# Patient Record
Sex: Male | Born: 1975 | Race: White | Hispanic: No | Marital: Single | Smoking: Never smoker
Health system: Southern US, Community
[De-identification: ages and names within clinical notes are randomized; demographics above are authoritative.]

## PROBLEM LIST (undated history)

## (undated) ENCOUNTER — Encounter

## (undated) ENCOUNTER — Encounter: Attending: Hematology & Oncology | Primary: Hematology & Oncology

## (undated) ENCOUNTER — Ambulatory Visit

## (undated) ENCOUNTER — Ambulatory Visit
Payer: BLUE CROSS/BLUE SHIELD | Attending: Student in an Organized Health Care Education/Training Program | Primary: Student in an Organized Health Care Education/Training Program

## (undated) ENCOUNTER — Telehealth

## (undated) ENCOUNTER — Telehealth: Attending: Gastroenterology | Primary: Gastroenterology

## (undated) ENCOUNTER — Ambulatory Visit: Payer: BLUE CROSS/BLUE SHIELD

## (undated) ENCOUNTER — Encounter: Attending: Gastroenterology | Primary: Gastroenterology

## (undated) ENCOUNTER — Telehealth: Attending: Hematology & Oncology | Primary: Hematology & Oncology

## (undated) ENCOUNTER — Encounter: Attending: Clinical | Primary: Clinical

## (undated) ENCOUNTER — Encounter
Attending: Student in an Organized Health Care Education/Training Program | Primary: Student in an Organized Health Care Education/Training Program

## (undated) ENCOUNTER — Other Ambulatory Visit

## (undated) ENCOUNTER — Telehealth: Attending: Radiation Oncology | Primary: Radiation Oncology

## (undated) ENCOUNTER — Encounter: Attending: Radiation Oncology | Primary: Radiation Oncology

## (undated) ENCOUNTER — Ambulatory Visit: Attending: Clinical | Primary: Clinical

## (undated) ENCOUNTER — Encounter: Attending: Oncology | Primary: Oncology

## (undated) ENCOUNTER — Encounter: Attending: Psychiatry | Primary: Psychiatry

## (undated) ENCOUNTER — Ambulatory Visit: Payer: PRIVATE HEALTH INSURANCE

## (undated) ENCOUNTER — Encounter: Attending: "Endocrinology | Primary: "Endocrinology

## (undated) ENCOUNTER — Ambulatory Visit: Attending: Hematology & Oncology | Primary: Hematology & Oncology

## (undated) ENCOUNTER — Inpatient Hospital Stay: Payer: BLUE CROSS/BLUE SHIELD

## (undated) ENCOUNTER — Ambulatory Visit: Payer: PRIVATE HEALTH INSURANCE | Attending: Clinical | Primary: Clinical

## (undated) ENCOUNTER — Telehealth: Attending: Oncology | Primary: Oncology

## (undated) ENCOUNTER — Telehealth
Attending: Student in an Organized Health Care Education/Training Program | Primary: Student in an Organized Health Care Education/Training Program

## (undated) ENCOUNTER — Ambulatory Visit: Attending: Registered" | Primary: Registered"

## (undated) ENCOUNTER — Ambulatory Visit: Payer: PRIVATE HEALTH INSURANCE | Attending: Radiation Oncology | Primary: Radiation Oncology

## (undated) ENCOUNTER — Encounter: Payer: PRIVATE HEALTH INSURANCE | Attending: Clinical | Primary: Clinical

## (undated) ENCOUNTER — Encounter: Payer: PRIVATE HEALTH INSURANCE | Attending: Gastroenterology | Primary: Gastroenterology

## (undated) ENCOUNTER — Ambulatory Visit
Payer: PRIVATE HEALTH INSURANCE | Attending: Student in an Organized Health Care Education/Training Program | Primary: Student in an Organized Health Care Education/Training Program

## (undated) ENCOUNTER — Ambulatory Visit: Attending: Psychiatry | Primary: Psychiatry

## (undated) ENCOUNTER — Encounter
Payer: PRIVATE HEALTH INSURANCE | Attending: Student in an Organized Health Care Education/Training Program | Primary: Student in an Organized Health Care Education/Training Program

## (undated) ENCOUNTER — Encounter: Payer: PRIVATE HEALTH INSURANCE | Attending: Otolaryngology | Primary: Otolaryngology

## (undated) ENCOUNTER — Encounter: Payer: PRIVATE HEALTH INSURANCE | Attending: Psychiatry | Primary: Psychiatry

## (undated) ENCOUNTER — Ambulatory Visit
Payer: PRIVATE HEALTH INSURANCE | Attending: Rehabilitative and Restorative Service Providers" | Primary: Rehabilitative and Restorative Service Providers"

## (undated) ENCOUNTER — Encounter: Attending: Otolaryngology | Primary: Otolaryngology

## (undated) ENCOUNTER — Ambulatory Visit: Payer: BLUE CROSS/BLUE SHIELD | Attending: Clinical | Primary: Clinical

## (undated) ENCOUNTER — Non-Acute Institutional Stay: Payer: PRIVATE HEALTH INSURANCE | Attending: Psychiatry | Primary: Psychiatry

## (undated) ENCOUNTER — Encounter: Attending: Psychiatric/Mental Health | Primary: Psychiatric/Mental Health

## (undated) ENCOUNTER — Ambulatory Visit: Payer: PRIVATE HEALTH INSURANCE | Attending: Registered" | Primary: Registered"

## (undated) ENCOUNTER — Encounter: Payer: PRIVATE HEALTH INSURANCE | Attending: Geriatric Medicine | Primary: Geriatric Medicine

## (undated) ENCOUNTER — Ambulatory Visit: Payer: PRIVATE HEALTH INSURANCE | Attending: Hematology & Oncology | Primary: Hematology & Oncology

## (undated) ENCOUNTER — Other Ambulatory Visit: Payer: BLUE CROSS/BLUE SHIELD

## (undated) ENCOUNTER — Ambulatory Visit: Payer: BLUE CROSS/BLUE SHIELD | Attending: Hematology & Oncology | Primary: Hematology & Oncology

## (undated) ENCOUNTER — Encounter: Attending: Geriatric Medicine | Primary: Geriatric Medicine

## (undated) ENCOUNTER — Institutional Professional Consult (permissible substitution): Attending: Oncology | Primary: Oncology

## (undated) ENCOUNTER — Telehealth: Payer: PRIVATE HEALTH INSURANCE

## (undated) ENCOUNTER — Telehealth: Attending: Geriatric Medicine | Primary: Geriatric Medicine

## (undated) DIAGNOSIS — K5792 Diverticulitis of intestine, part unspecified, without perforation or abscess without bleeding: Secondary | ICD-10-CM

## (undated) DIAGNOSIS — M459 Ankylosing spondylitis of unspecified sites in spine: Secondary | ICD-10-CM

## (undated) DIAGNOSIS — G629 Polyneuropathy, unspecified: Secondary | ICD-10-CM

## (undated) DIAGNOSIS — F32A Depression, unspecified: Secondary | ICD-10-CM

## (undated) DIAGNOSIS — K429 Umbilical hernia without obstruction or gangrene: Secondary | ICD-10-CM

## (undated) DIAGNOSIS — C7931 Secondary malignant neoplasm of brain: Secondary | ICD-10-CM

## (undated) DIAGNOSIS — C73 Malignant neoplasm of thyroid gland: Secondary | ICD-10-CM

## (undated) DIAGNOSIS — C439 Malignant melanoma of skin, unspecified: Secondary | ICD-10-CM

## (undated) HISTORY — PX: OTHER SURGICAL HISTORY: SHX169

## (undated) HISTORY — PX: COLON SURGERY: SHX602

## (undated) HISTORY — PX: BRAIN SURGERY: SHX531

## (undated) HISTORY — PX: LYMPH NODE DISSECTION: SHX5087

## (undated) HISTORY — PX: HERNIA REPAIR: SHX51

## (undated) HISTORY — PX: CRANIOTOMY FOR TUMOR: SUR345

## (undated) HISTORY — PX: THYROID SURGERY: SHX805

## (undated) MED ORDER — IBUPROFEN 200 MG TABLET: Freq: Every day | ORAL | 0.00000 days | PRN

## (undated) MED ORDER — DEXAMETHASONE 2 MG TABLET
Freq: Two times a day (BID) | ORAL | 0.00000 days | Status: SS
Start: ? — End: 2020-02-02

## (undated) MED ORDER — MEMANTINE 10 MG TABLET: Freq: Every day | ORAL | 0.00000 days | Status: SS

## (undated) MED ORDER — LEVOTHYROXINE 125 MCG TABLET: Freq: Every day | ORAL | 0.00000 days | Status: SS

---
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
## 2007-03-05 ENCOUNTER — Encounter (INDEPENDENT_AMBULATORY_CARE_PROVIDER_SITE_OTHER): Payer: Self-pay | Admitting: *Deleted

## 2007-03-05 ENCOUNTER — Inpatient Hospital Stay (HOSPITAL_COMMUNITY): Admission: EM | Admit: 2007-03-05 | Discharge: 2007-03-09 | Payer: Self-pay | Admitting: Family Medicine

## 2007-03-05 ENCOUNTER — Ambulatory Visit: Payer: Self-pay | Admitting: Internal Medicine

## 2007-03-05 ENCOUNTER — Encounter: Admission: RE | Admit: 2007-03-05 | Discharge: 2007-03-05 | Payer: Self-pay | Admitting: Orthopedic Surgery

## 2007-03-17 ENCOUNTER — Encounter (INDEPENDENT_AMBULATORY_CARE_PROVIDER_SITE_OTHER): Payer: Self-pay | Admitting: *Deleted

## 2007-04-01 ENCOUNTER — Encounter (INDEPENDENT_AMBULATORY_CARE_PROVIDER_SITE_OTHER): Payer: Self-pay | Admitting: *Deleted

## 2007-04-01 ENCOUNTER — Encounter: Admission: RE | Admit: 2007-04-01 | Discharge: 2007-04-01 | Payer: Self-pay | Admitting: General Surgery

## 2007-04-03 ENCOUNTER — Emergency Department (HOSPITAL_COMMUNITY): Admission: EM | Admit: 2007-04-03 | Discharge: 2007-04-04 | Payer: Self-pay | Admitting: Emergency Medicine

## 2008-11-09 ENCOUNTER — Encounter: Payer: Self-pay | Admitting: Gastroenterology

## 2009-08-14 ENCOUNTER — Encounter: Payer: Self-pay | Admitting: Gastroenterology

## 2009-09-13 ENCOUNTER — Encounter (INDEPENDENT_AMBULATORY_CARE_PROVIDER_SITE_OTHER): Payer: Self-pay | Admitting: *Deleted

## 2009-09-13 ENCOUNTER — Inpatient Hospital Stay (HOSPITAL_COMMUNITY): Admission: EM | Admit: 2009-09-13 | Discharge: 2009-09-16 | Payer: Self-pay | Admitting: Family Medicine

## 2009-09-14 ENCOUNTER — Encounter (INDEPENDENT_AMBULATORY_CARE_PROVIDER_SITE_OTHER): Payer: Self-pay | Admitting: *Deleted

## 2009-10-13 ENCOUNTER — Encounter (INDEPENDENT_AMBULATORY_CARE_PROVIDER_SITE_OTHER): Payer: Self-pay | Admitting: *Deleted

## 2009-12-11 ENCOUNTER — Encounter: Payer: Self-pay | Admitting: Gastroenterology

## 2009-12-12 ENCOUNTER — Encounter (INDEPENDENT_AMBULATORY_CARE_PROVIDER_SITE_OTHER): Payer: Self-pay | Admitting: *Deleted

## 2010-01-11 ENCOUNTER — Encounter: Payer: Self-pay | Admitting: Gastroenterology

## 2010-01-18 DIAGNOSIS — M109 Gout, unspecified: Secondary | ICD-10-CM | POA: Insufficient documentation

## 2010-01-23 ENCOUNTER — Other Ambulatory Visit: Payer: Self-pay | Admitting: Gastroenterology

## 2010-01-23 ENCOUNTER — Ambulatory Visit
Admission: RE | Admit: 2010-01-23 | Discharge: 2010-01-23 | Payer: Self-pay | Source: Home / Self Care | Attending: Gastroenterology | Admitting: Gastroenterology

## 2010-01-23 ENCOUNTER — Encounter (INDEPENDENT_AMBULATORY_CARE_PROVIDER_SITE_OTHER): Payer: Self-pay | Admitting: *Deleted

## 2010-01-23 ENCOUNTER — Encounter: Payer: Self-pay | Admitting: Gastroenterology

## 2010-01-23 DIAGNOSIS — M47817 Spondylosis without myelopathy or radiculopathy, lumbosacral region: Secondary | ICD-10-CM | POA: Insufficient documentation

## 2010-01-23 DIAGNOSIS — R1032 Left lower quadrant pain: Secondary | ICD-10-CM | POA: Insufficient documentation

## 2010-01-23 LAB — IBC PANEL
Iron: 94 ug/dL (ref 42–165)
Saturation Ratios: 27.5 % (ref 20.0–50.0)
Transferrin: 244.2 mg/dL (ref 212.0–360.0)

## 2010-01-23 LAB — CBC WITH DIFFERENTIAL/PLATELET
Basophils Absolute: 0.4 10*3/uL — ABNORMAL HIGH (ref 0.0–0.1)
Basophils Relative: 4.1 % — ABNORMAL HIGH (ref 0.0–3.0)
Eosinophils Absolute: 0.1 10*3/uL (ref 0.0–0.7)
Eosinophils Relative: 1.3 % (ref 0.0–5.0)
HCT: 45.7 % (ref 39.0–52.0)
Hemoglobin: 15.6 g/dL (ref 13.0–17.0)
Lymphocytes Relative: 23.1 % (ref 12.0–46.0)
Lymphs Abs: 2.1 10*3/uL (ref 0.7–4.0)
MCHC: 34.2 g/dL (ref 30.0–36.0)
MCV: 88.2 fl (ref 78.0–100.0)
Monocytes Absolute: 0.6 10*3/uL (ref 0.1–1.0)
Monocytes Relative: 6.3 % (ref 3.0–12.0)
Neutro Abs: 5.9 10*3/uL (ref 1.4–7.7)
Neutrophils Relative %: 65.2 % (ref 43.0–77.0)
Platelets: 257 10*3/uL (ref 150.0–400.0)
RBC: 5.17 Mil/uL (ref 4.22–5.81)
RDW: 14.5 % (ref 11.5–14.6)
WBC: 9 10*3/uL (ref 4.5–10.5)

## 2010-01-23 LAB — HEPATIC FUNCTION PANEL
ALT: 34 U/L (ref 0–53)
AST: 32 U/L (ref 0–37)
Albumin: 4.3 g/dL (ref 3.5–5.2)
Alkaline Phosphatase: 49 U/L (ref 39–117)
Bilirubin, Direct: 0.1 mg/dL (ref 0.0–0.3)
Total Bilirubin: 0.8 mg/dL (ref 0.3–1.2)
Total Protein: 7.2 g/dL (ref 6.0–8.3)

## 2010-01-23 LAB — BASIC METABOLIC PANEL
BUN: 10 mg/dL (ref 6–23)
CO2: 27 mEq/L (ref 19–32)
Calcium: 9.5 mg/dL (ref 8.4–10.5)
Chloride: 104 mEq/L (ref 96–112)
Creatinine, Ser: 0.9 mg/dL (ref 0.4–1.5)
GFR: 107.69 mL/min (ref >60.00)
Glucose, Bld: 79 mg/dL (ref 70–99)
Potassium: 4.3 mEq/L (ref 3.5–5.1)
Sodium: 141 mEq/L (ref 135–145)

## 2010-01-23 LAB — IGA: IgA: 457 mg/dL — ABNORMAL HIGH (ref 68–378)

## 2010-01-23 LAB — FERRITIN: Ferritin: 78.5 ng/mL (ref 22.0–322.0)

## 2010-01-23 LAB — FOLATE: Folate: 9.7 ng/mL

## 2010-01-23 LAB — SEDIMENTATION RATE: Sed Rate: 7 mm/hr (ref 0–22)

## 2010-01-23 LAB — VITAMIN B12: Vitamin B-12: 301 pg/mL (ref 211–911)

## 2010-01-23 LAB — TSH: TSH: 1.15 u[IU]/mL (ref 0.35–5.50)

## 2010-01-23 LAB — HIGH SENSITIVITY CRP: CRP, High Sensitivity: 2.22 mg/L (ref 0.00–5.00)

## 2010-01-29 ENCOUNTER — Ambulatory Visit
Admission: RE | Admit: 2010-01-29 | Discharge: 2010-01-29 | Payer: Self-pay | Source: Home / Self Care | Attending: Gastroenterology | Admitting: Gastroenterology

## 2010-01-29 ENCOUNTER — Other Ambulatory Visit: Payer: Self-pay | Admitting: Gastroenterology

## 2010-01-29 ENCOUNTER — Encounter: Payer: Self-pay | Admitting: Gastroenterology

## 2010-01-30 ENCOUNTER — Encounter (INDEPENDENT_AMBULATORY_CARE_PROVIDER_SITE_OTHER): Payer: Self-pay | Admitting: *Deleted

## 2010-02-01 ENCOUNTER — Telehealth: Payer: Self-pay | Admitting: Gastroenterology

## 2010-02-12 ENCOUNTER — Encounter: Payer: Self-pay | Admitting: Gastroenterology

## 2010-02-15 DIAGNOSIS — K5289 Other specified noninfective gastroenteritis and colitis: Secondary | ICD-10-CM | POA: Insufficient documentation

## 2010-02-15 NOTE — Assessment & Plan Note (Signed)
Summary: REOCCURING DIVERTICULITIS...AS.   History of Present Illness Visit Type: Initial Consult Primary GI MD: Sheryn Bison MD FACP FAGA Primary Provider: Reino Bellis, MD Requesting Provider: Reino Bellis, MD Chief Complaint: Patient here to discuss recurrent diverticulitis found on several CT scans. He states that he is doing okay at this time but often experiences LLQ abdominal pain. 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 He has responded repeatedly to treatment with Cipro and metronidazole, most recent episode beginning December 29 of this year.  His abdominal pain is localized to the left lower quadrant area with occasional nausea and vomiting, fever and chills. 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 He apparently has had rheumatology evaluation and  has a positive HLA B. 27 but a negative rheumatoid factor. He does use p.r.n. Aleve up to 6 a day.  Recently the patient has had some discomfort in his sub- costal areas bilaterally but denies gas, bloating, rectal bleeding, or bowel irregularity. He is on a regular diet and denies any specific food intolerances. He specifically denies upper gastrointestinal or hepatobiliary symptomatology. His appetite is good and his weight is stable.   GI Review of Systems    Reports abdominal pain.     Location of  Abdominal pain: LLQ.    Denies acid reflux, belching, bloating, chest  pain, dysphagia with liquids, dysphagia with solids, heartburn, loss of appetite, nausea, vomiting, vomiting blood, weight loss, and  weight gain.      Reports diarrhea and  diverticulosis.     Denies anal fissure, black tarry stools, change in bowel habit, constipation, fecal incontinence, heme positive stool, hemorrhoids, irritable bowel syndrome, jaundice, light color stool, liver problems, rectal bleeding, and  rectal pain. Preventive Screening-Counseling & Management  Caffeine-Diet-Exercise     Does Patient Exercise: yes    Current Medications (verified): 1)  Ibuprofen 200 Mg Tabs (Ibuprofen) .... Take 3 Tablets By Mouth As Needed 2)  Allegra Allergy 180 Mg Tabs (Fexofenadine Hcl) .... Take 1 Tablet By Mouth Once Daily As Needed  Allergies (verified): No Known Drug Allergies  Past History:  Past medical, surgical, family and social histories (including risk factors) reviewed for relevance to current acute and chronic problems.  Past Medical History: Current Problems:  GOUT (ICD-274.9) DIVERTICULITIS, COLON, WITH PERFORATION (ICD-562.11) Disc Herniations A. Spondylosis  Past Surgical History: Right Ankle Sugery  Family History: Reviewed history and no changes required. No FH of Colon Cancer: Family History of Prostate Cancer: Father Family History of Stomach Cancer: MGM??? Family History of Diabetes: Father, Paternal Aunt, Paternal Uncle, Maternal Aunt, PGM Family History of Heart Disease: Father, MGM, MGF, PGM, PGF  Social History: Reviewed history from 01/18/2010 and no changes required. Patient has never smoked.  Alcohol Use - no Illicit Drug Use - no Occupation: Dentist Patient gets regular exercise. Does Patient Exercise:  yes  Review of Systems       The patient complains of arthritis/joint pain and back pain.  The patient denies allergy/sinus, anemia, anxiety-new, blood in urine, breast changes/lumps,  change in vision, confusion, cough,  coughing up blood, depression-new, fainting, fatigue, fever, headaches-new, hearing problems, heart murmur, heart rhythm changes, itching, menstrual pain, muscle pains/cramps, night sweats, nosebleeds, pregnancy symptoms, shortness of breath, skin rash, sleeping problems, sore throat, swelling of feet/legs, swollen lymph glands, thirst - excessive , urination - excessive , urination changes/pain, urine leakage, vision changes, and voice change.    Vital Signs:  Patient profile:   35 year old male Height:      74 inches Weight:      204.50 pounds BMI:     26.35 BSA:     2.20 Pulse rate:   88 / minute Pulse rhythm:   regular BP sitting:   140 / 98  (right arm)  Vitals Entered By: Lamona Curl CMA Duncan Dull) (January 23, 2010 2:09 PM)  Physical Exam  General:  Well developed, well nourished, no acute distress.healthy appearing.   Head:  Normocephalic and atraumatic. Eyes:  PERRLA, no icterus.exam deferred to patient's ophthalmologist.   Neck:  Supple; no masses or thyromegaly. Chest Wall:  Symmetrical,  no deformities . Lungs:  Clear throughout to auscultation. Heart:  Regular rate and rhythm; no murmurs, rubs,  or bruits. Abdomen:  Soft, nontender and nondistended. No masses, hepatosplenomegaly or hernias noted. Normal bowel sounds.I cannot appreciate any abdominal masses or significant tenderness at this time. Rectal:  deferred until time of colonoscopy.   Prostate:  Exam deferred until time of colonoscopy.   Msk:  Symmetrical with no gross deformities. Normal posture.No swollen or hot joints at this time. Examination of the back unremarkable. Pulses:  Normal pulses noted. Extremities:  No clubbing, cyanosis, edema or deformities noted. Neurologic:  Alert and  oriented x4;  grossly normal neurologically. Skin:  Intact without significant lesions or rashes. Cervical Nodes:  No significant cervical adenopathy. Psych:  Alert and cooperative. Normal mood and affect.   Impression &  Recommendations:  Problem # 1:  DIVERTICULITIS, COLON, WITH PERFORATION (ICD-562.11) Assessment Deteriorated I am concerned that [name] is headed for a more serious complication of his diverticulitis that may require colostomy intervention. I have reviewed his CT scans and he definitely has had recurrent diverticulitis which is somewhat unusual in someone his age. 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 I have placed him on a high fiber diet with daily Benefiber, liberal p.o. fluids, p.r.n. Levsin, and Avastin to avoid NSAIDs and to use Celebrex 200 mg twice a day as tolerated for his arthropathy. Cox-1 medications can cause exacerbation of diverticular disease. I have begun to broach the subject of surgery with this patient, and had him see our patient education video on diverticulitis and its management. Colonoscopy has been scheduled as soon as possible. He is to call us in the interim should he have an exacerbation of his problem. Orders: TLB-CBC Platelet - w/Differential (85025-CBCD) TLB-BMP (Basic Metabolic Panel-BMET) (80048-METABOL) TLB-Hepatic/Liver Function Pnl (80076-HEPATIC) TLB-TSH (Thyroid Stimulating Hormone) (84443-TSH) TLB-B12, Serum-Total ONLY (54098-J19) TLB-Ferritin (82728-FER) TLB-Folic Acid (Folate) (82746-FOL) TLB-IBC Pnl (Iron/FE;Transferrin) (83550-IBC) TLB-CRP-High Sensitivity (C-Reactive Protein) (86140-FCRP) TLB-Sedimentation Rate (ESR) (85652-ESR) TLB-IgA (Immunoglobulin A) (82784-IGA) T-Sprue Panel (Celiac Disease Aby Eval) (83516x3/[address]) Colonoscopy (Colon)  Problem # 2:  ABDOMINAL PAIN, LEFT LOWER QUADRANT (ICD-789.04) Assessment: Improved p.r.n. sublingual Levsin recommended. Orders: TLB-CBC Platelet - w/Differential (85025-CBCD) TLB-BMP (Basic Metabolic Panel-BMET) (80048-METABOL) TLB-Hepatic/Liver Function Pnl (80076-HEPATIC) TLB-TSH (Thyroid Stimulating Hormone)  (84443-TSH) TLB-B12, Serum-Total ONLY (14782-N56) TLB-Ferritin (82728-FER) TLB-Folic Acid (Folate) (82746-FOL) TLB-IBC Pnl (Iron/FE;Transferrin) (  83550-IBC) TLB-CRP-High Sensitivity (C-Reactive Protein) (86140-FCRP) TLB-Sedimentation Rate (ESR) (85652-ESR) TLB-IgA (Immunoglobulin A) (82784-IGA) T-Sprue Panel (Celiac Disease Aby Eval) (83516x3/[address]) Colonoscopy (Colon)  Problem # 3:  SPONDYLOSIS, LUMBAR (ICD-721.3) Assessment: Improved discontinue Aleve and use b.i.d. Celebrex 20 mg. We will request records from rheumatology for review. There is some question as to whether or not this patient has ankylosing spondylitis which in itself is associated with a form of inflammatory bowel disease.  Patient Instructions: 1)   Copy sent to : Reino Bellis, MD and Dr.Devenshar in rheumatology. 2)  Your procedure has been scheduled for 01/29/2010, please follow the seperate instructions.  3)  [facility] Patient Information Guide given to patient.  4)  Colonoscopy and Flexible Sigmoidoscopy brochure given. 5)  Your prescription(s) have been sent to you pharmacy.  6)  High Fiber, Low Fat  Healthy Eating Plan brochure given.  7)  Please go to the basement today for your labs.  8)  Today you watched the diverticular diease movie.  9)  The medication list was reviewed and reconciled.  All changed / newly prescribed medications were explained.  A complete medication list was provided to the patient / caregiver. 10)  High Fiber, Low Fat  Healthy Eating Plan brochure given.  11)  Diverticular Disease brochure given.  Prescriptions: LEVSIN 0.125 MG TABS (HYOSCYAMINE SULFATE) take one by mouth two times a day as needed  #60 x 3   Entered by:   Harlow Mares CMA (AAMA)   Authorized by:   Mardella Layman MD [facility]   Signed by:   Harlow Mares CMA (AAMA) on 01/23/2010   Method used:   [facility] [id number]       [address]        [place], [place]  [address]       Ph: [phone] or [phone]       Fax: [phone]   RxID:   [id number] CELEBREX 200 MG CAPS (CELECOXIB) take one by mouth two times a day  #60 x 3   Entered by:   Harlow Mares CMA (AAMA)   Authorized by:   Mardella Layman MD [facility]   Signed by:   Harlow Mares CMA (AAMA) on 01/23/2010   Method used:   [facility]        [facility]ail[facility]       [address]       [place], [place]  [address]       Ph: [phone] or [phone]       Fax: [phone]   RxID:   [id number] MOVIPREP 100 GM  SOLR (PEG-KCL-NACL-NASULF-NA ASC-C) As per prep instructions.  #1 x 0   Entered by:   Harlow Mares CMA (AAMA)   Authorized by:   Mardella Layman MD [facility]   Signed by:   Harlow Mares CMA (AAMA) on 01/23/2010   Method used:   [facility]        [facility]  [address] (retail)       [address]       [place], [place]  [address]       Ph: [phone] or [phone]       Fax: [phone]   RxID:   [id number]

## 2010-02-15 NOTE — Letter (Signed)
Summary: New Patient letter  [facility]  [address] [place], [place] [address]   Phone: [phone]  Fax: [phone]       12/12/2009 MRN: [id number]  Doctor Stavola [address] [place], [place]  [address]  Dear Mr. Stimmel,  Welcome to the Gastroenterology Division at [facility]    You are scheduled to see Dr. Leone Payor on 01/30/2010 at 9:00AM on the 3rd floor at [facility], [address]  We ask that you try to arrive at our office 15 minutes prior to your appointment time to allow for check-in.  We would like you to complete the enclosed self-administered evaluation form prior to your visit and bring it with you on the day of your appointment.  We will review it with you.  Also, please bring a complete list of all your medications or, if you prefer, bring the medication bottles and we will list them.  Please bring your insurance card so that we may make a copy of it.  If your insurance requires a referral to see a specialist, please bring your referral form from your primary care physician.  Co-payments are due at the time of your visit and may be paid by cash, check or credit card.     Your office visit will consist of a consult with your physician (includes a physical exam), any laboratory testing he/she may order, scheduling of any necessary diagnostic testing (e.g. x-ray, ultrasound, CT-scan), and scheduling of a procedure (e.g. Endoscopy, Colonoscopy) if required.  Please allow enough time on your schedule to allow for any/all of these possibilities.    If you cannot keep your appointment, please call [phone] to cancel or reschedule prior to your appointment date.  This allows [place] the opportunity to schedule an appointment for another patient in need of care.  If you do not cancel or reschedule by 5 p.m. the business day prior to your appointment date, you will be charged a $50.00 late cancellation/no-show fee.    Thank you for  choosing [facility] for your medical needs.  We appreciate the opportunity to care for you.  Please visit [place] at our website  to learn more about our practice.                     Sincerely,                                                             The Gastroenterology Division

## 2010-02-15 NOTE — Letter (Signed)
Summary: Appt Reminder 2  [facility]  [address] [place], [place] [address]   Phone: [phone]  Fax: [phone]        January 30, 2010 MRN: [id number]    Lee Castillo [address] [place], [place]  [address]    Dear [name],   You have a return appointment with Dr. Jarold Motto on February 7,2013 at 1130 am.  Please remember to bring a complete list of the medicines you are taking, your insurance card and your co-pay.  If you have to cancel or reschedule this appointment, please call before 5:00 pm the evening before to avoid a cancellation fee.  If you have any questions or concerns, please call [phone].    Sincerely,    Graciella Freer RN

## 2010-02-15 NOTE — Discharge Summary (Signed)
Lee Castillo, Lee Castillo              ACCOUNT NO.:  [id number]      MEDICAL RECORD NO.:  [id number]          PATIENT TYPE:  INP      LOCATION:  5154                         FACILITY:  [facility]      PHYSICIAN:  Leighton Roach McDiarmid, M.D.DATE OF BIRTH:  [birth year]      DATE OF ADMISSION:  03/05/2007   DATE OF DISCHARGE:  03/09/2007                                  DISCHARGE SUMMARY      CONSULTANTS:  Surgery, Dr. Carolynne Edouard, and nutrition.      PROCEDURES:  CT abdomen.      REASON FOR ADMISSION:  The patient is a 35 year old male admitted from   Dr. Jonetta Osgood office with bilateral abdominal pain for 4 days, found to   have perforated sigmoid diverticulitis on a CT scan.      PERTINENT ADMISSION LABS:  Include a CBC which showed a white blood cell   count of 17.5, hemoglobin of 13, hematocrit 38.8, and platelets 221.   Comprehensive metabolic panel shows sodium of 135, potassium 3.4,   chloride 103, bicarbonate 24, BUN 7, creatinine 0.88, glucose 131.   Liver functions were within normal limits with the exception of a total   bilirubin of 1.8.  Fecal occult blood was negative.  UA showed 15   ketones, 30 protein, 2.0 urobilinogen, but otherwise negative.      CT abdomen showed:   1. Acute diverticulitis with perforation, a small amount of       extraluminal gas was present, trace free fluid was detected, no       definite abscess seen.   2. Calcified disk herniations and spinal stenosis in the lumbar spine       was an incidental finding.      PRIMARY DISCHARGE DIAGNOSES:  Include:   1. Perforated sigmoid diverticulitis.   2. Allergic rhinitis.   3. Ruptured disk at L4-L5 and L5-S1.      DISCHARGE MEDICATIONS:  Include:   1. Flagyl 500 mg p.o. t.i.d.   2. Cipro 500 mg p.o. b.i.d.   3. Percocet 10/325 one tablet q. 6 h. p.r.n. for moderate to severe       pain.   4. Fexofenadine 180 mg p.o. daily.   5. Ibuprofen 400 mg p.o. p.r.n. for pain.      GENERAL HOSPITAL COURSE:  Perforated  diverticulitis.  The patient was   made NPO and placed on bowel rest.  Surgery was consulted and decided   that bowel rest would be the appropriate course rather than surgery.   The patient was started on IV ceftriaxone and IV Flagyl.  Prior to   discharge, this was changed to p.o. Cipro and p.o. Flagyl.  The   patient's pain did gradually get better.  He was able to eat.  We   controlled his pain with morphine and Toradol, and his white blood cell   count did decrease from the admission white blood cell count of 17.5 to   white blood cell count of 7.9 on discharge.  A blood culture showed no   growth  x2.      CONDITION AT TIME OF DISCHARGE:  Stable.      PENDING TESTS:  None.      DISPOSITION:  The patient was discharged home in stable condition.      FOLLOWUP APPOINTMENTS:  The patient is to make a followup appointment   with Dr. Margaretha Sheffield, and the patient is to make a followup appointment with   Dr. Carolynne Edouard in 2 to 3 weeks.      FOLLOWUP ISSUES:  The patient will likely need a colonoscopy 2 to 6   weeks following discharge to rule out neoplasm.               Asher Muir, MD   Electronically Signed               Leighton Roach McDiarmid, M.D.   Electronically Signed         SO/MEDQ  D:  03/17/2007  T:  03/17/2007  Job:  [id number]      cc:   Ollen Gross. Vernell Morgans, M.D.   Dr. Margaretha Sheffield

## 2010-02-15 NOTE — Progress Notes (Addendum)
Summary: Dr Frederica Kuster from [facility]   Phone Note Other Incoming   Caller: Dr Frederica Kuster from [facility] [phone] Reason for Call: Discuss lab or test results Summary of Call: called...he agrees with x. of segmental colitis,,, Initial call taken by: Leanor Kail [facility],  February 01, 2010 1:04 PM

## 2010-02-15 NOTE — Miscellaneous (Signed)
Summary: Levsin  Clinical Lists Changes  Medications: Added new medication of LEVSIN/SL 0.125 MG SUBL (HYOSCYAMINE SULFATE) 1 tab under tongue every 6 hours as needed for abd pain relief - Signed Rx of LEVSIN/SL 0.125 MG SUBL (HYOSCYAMINE SULFATE) 1 tab under tongue every 6 hours as needed for abd pain relief;  #30 x 1;  Signed;  Entered by: Clide Cliff RN;  Authorized by: Mardella Layman MD [facility];  Method [facility]: [facility], [place], [place]  [address], Ph: [phone] or [phone], Fax: [phone]    Prescriptions: LEVSIN/SL 0.125 MG SUBL (HYOSCYAMINE SULFATE) 1 tab under tongue every 6 hours as needed for abd pain relief  #30 x 1   Entered by:   Clide Cliff RN   Authorized by:   Mardella Layman MD [facility]   Signed by:   Clide Cliff RN on 01/29/2010   Method used:   [facility]        [facility]  [address] (retail)       [address]       [place], [place]  [address]       Ph: [phone] or [phone]       Fax: [phone]   RxID:   [id number]

## 2010-02-15 NOTE — Letter (Signed)
Summary: Ralene Cork DO  Ozzie Hoyle Draper DO   Imported By: Lester Pablo Pena 01/29/2010 10:14:16  _____________________________________________________________________  External Attachment:    Type:   Image     Comment:   External Document

## 2010-02-15 NOTE — Letter (Signed)
Summary: Nat Math DO  T Ryan Draper DO   Imported By: Lester Yaphank 01/29/2010 10:16:45  _____________________________________________________________________  External Attachment:    Type:   Image     Comment:   External Document

## 2010-02-15 NOTE — Letter (Signed)
Summary: [facility] Instructions  [facility]  [address] [place], [place] [address]   Phone: [phone]  Fax: [phone]       Olanda Burruss    03-03-1975    MRN: [id number]        Procedure Day /Date: Monday 01/29/2010     Arrival Time: 9:30am     Procedure Time: 10:30am     Location of Procedure:                    X   Endoscopy Center (4th Floor)                        PREPARATION FOR COLONOSCOPY WITH MOVIPREP   Starting 5 days prior to your procedure 01/24/2010 do not eat nuts, seeds, popcorn, corn, beans, peas,  salads, or any raw vegetables.  Do not take any fiber supplements (e.g. Metamucil, Citrucel, and Benefiber).  THE DAY BEFORE YOUR PROCEDURE         Sunday 01/28/2010  1.  Drink clear liquids the entire day-NO SOLID FOOD  2.  Do not drink anything colored red or purple.  Avoid juices with pulp.  No orange juice.  3.  Drink at least 64 oz. (8 glasses) of fluid/clear liquids during the day to prevent dehydration and help the prep work efficiently.  CLEAR LIQUIDS INCLUDE: Water Jello Ice Popsicles Tea (sugar ok, no milk/cream) Powdered fruit flavored drinks Coffee (sugar ok, no milk/cream) Gatorade Juice: apple, white grape, white cranberry  Lemonade Clear bullion, consomm, broth Carbonated beverages (any kind) Strained chicken noodle soup Hard Candy                             4.  In the morning, mix first dose of MoviPrep solution:    Empty 1 Pouch A and 1 Pouch B into the disposable container    Add lukewarm drinking water to the top line of the container. Mix to dissolve    Refrigerate (mixed solution should be used within 24 hrs)  5.  Begin drinking the prep at 5:00 p.m. The MoviPrep container is divided by 4 marks.   Every 15 minutes drink the solution down to the next mark (approximately 8 oz) until the full liter is complete.   6.  Follow completed prep with 16 oz of clear liquid of your choice (Nothing red or  purple).  Continue to drink clear liquids until bedtime.  7.  Before going to bed, mix second dose of MoviPrep solution:    Empty 1 Pouch A and 1 Pouch B into the disposable container    Add lukewarm drinking water to the top line of the container. Mix to dissolve    Refrigerate  THE DAY OF YOUR PROCEDURE     Monday 01/29/2010  Beginning at 5:30am (5 hours before procedure):         1. Every 15 minutes, drink the solution down to the next mark (approx 8 oz) until the full liter is complete.  2. Follow completed prep with 16 oz. of clear liquid of your choice.    3. You may drink clear liquids until 8:30am (2 HOURS BEFORE PROCEDURE).   MEDICATION INSTRUCTIONS  Unless otherwise instructed, you should take regular prescription medications with a small sip of water   as early as possible the morning of your procedure.  OTHER INSTRUCTIONS  You will need a responsible adult at least 35 years of age to accompany you and drive you home.   This person must remain in the waiting room during your procedure.  Wear loose fitting clothing that is easily removed.  Leave jewelry and other valuables at home.  However, you may wish to bring a book to read or  an iPod/MP3 player to listen to music as you wait for your procedure to start.  Remove all body piercing jewelry and leave at home.  Total time from sign-in until discharge is approximately 2-3 hours.  You should go home directly after your procedure and rest.  You can resume normal activities the  day after your procedure.  The day of your procedure you should not:   Drive   Make legal decisions   Operate machinery   Drink alcohol   Return to work  You will receive specific instructions about eating, activities and medications before you leave.    The above instructions have been reviewed and explained to me by   _______________________    I fully understand and can verbalize these instructions  _____________________________ Date _________

## 2010-02-15 NOTE — Consult Note (Signed)
NAME:  Lee Castillo, Lee Castillo              ACCOUNT NO.:  [id number]   MEDICAL RECORD NO.:  [id number]          PATIENT TYPE:  INP   LOCATION:  5120                         FACILITY:  [facility]   PHYSICIAN:  Paul S. Toth III, M.D. DATE OF BIRTH:  03/11/1975   DATE OF CONSULTATION:  03/05/2007  DATE OF DISCHARGE:                                 CONSULTATION   Mr. Christy is a 35-year-old white male who presents with left lower  quadrant pain since Sunday.  The pain has gradually been getting worse.  He has run a low-grade fever today.  He denies any nausea or vomiting or  diarrhea.  His bowels have been moving pretty regular.  He has not  noticed any blood in his stools.  His other review of systems are  unremarkable.   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   PAST SURGICAL HISTORY:  None.   MEDICATIONS:  None.   ALLERGIES:  None.   SOCIAL HISTORY:  He denies use of tobacco products.   FAMILY HISTORY:  Noncontributory.   PHYSICAL EXAMINATION:  VITAL SIGNS:  Not been recorded yet.  GENERAL:  He is a well-developed, well-nourished, white male in no acute  distress.  SKIN:  Warm and dry.  No jaundice.  EYES:  Extraocular muscles are intact.  Pupils are equal, round, and  reactive to light.  Sclerae nonicteric.  LUNGS:  Clear bilaterally with no use of accessory muscles.  HEART:  Regular rate and rhythm with an impulse on the left chest.  ABDOMEN:  Soft.  He does have moderate tenderness in the left lower  quadrant but no signs of peritonitis.  EXTREMITIES:  No cyanosis, clubbing, or edema with good strength in his  arms and legs.  PSYCHOLOGIC:  He is alert and oriented x3 with no evidence today of  anxiety or depression.   LABORATORY:  All pending.   On review of his CT scan, he does have some sigmoid diverticulitis with  a small dot of extra luminal gas consistent with a small contained  microperforation.   ASSESSMENT/PLAN:  This is a 35-year-old  white male with what appears to  be sigmoid diverticulitis with some evidence of small contained  microperforation.  Given how soft his abdomen is, I think it would be  reasonable at this point to put him on bowel rest, broad spectrum  antibiotics, and monitor him very closely.  If he improves, then we can  talk about whether we ought to engage in diet modification versus  elective surgery on his colon in 6-8 weeks  after the inflammation and infection have settled down.  If he does not  improve, then he may require exploration with possible colostomy at some  point.  He understands all this and agrees with this treatment plan.  We  will continue to follow him closely with you.      Paul S. Toth III, M.D.  Electronically Signed     PST/MEDQ  D:  03/05/2007  T:  03/06/2007  Job:  [id number]   If he does not   improve, then he may require exploration with possible colostomy at some   point.  He understands all this and agrees with this treatment plan.  We   will continue to follow him closely with you.               Ollen Gross. Vernell Morgans, M.D.   Electronically Signed            PST/MEDQ  D:  03/05/2007  T:  03/06/2007  Job:  [id number]

## 2010-02-15 NOTE — Procedures (Addendum)
Summary: Colonoscopy  Patient: Lee Castillo Note: All result statuses are Final unless otherwise noted.  Tests: (1) Colonoscopy (COL)   COL Colonoscopy           DONE     [facility]     [name]     [place], [place]  [address]           COLONOSCOPY PROCEDURE REPORT           PATIENT:  [name], [name]  MR#:  [id number]     BIRTHDATE:  07-02-1975, 34 yrs. old  GENDER:  male     ENDOSCOPIST:  Vania Rea. Jarold Motto, MD, [facility]     REF. BY:  Reino Bellis, DO     PROCEDURE DATE:  01/29/2010     PROCEDURE:  Colonoscopy with biopsy     ASA CLASS:  Class I     INDICATIONS:  LLQ PAIN AND RECURRENT DIVERTICULITIS.     MEDICATIONS:   Fentanyl 125 mcg IV, Versed 12 mg IV           DESCRIPTION OF PROCEDURE:   After the risks benefits and     alternatives of the procedure were thoroughly explained, informed     consent was obtained.  Digital rectal exam was performed and     revealed no abnormalities.   The LB 180AL [id number] endoscope was     introduced through the anus and advanced to the cecum, which was     identified by both the appendix and ileocecal valve, limited by     diverticulosis, severe.    The quality of the prep was excellent,     using MoviPrep.  The instrument was then slowly withdrawn as the     colon was fully examined.     <<PROCEDUREIMAGES>>           FINDINGS:  Severe diverticulosis was found in the sigmoid to     descending colon segments. MARKED EDEMA.INFLAMMATION,EROSIONS IN     SIGMOID AREA.SEE PICTURES.BIOPSIES DONE.SCATTERED TICS IN ENTIRE     COLON,MOST SEVERE IN SIGMOID AREA.  no active bleeding or blood in     c.  No polyps or cancers were seen.   Retroflexed views in the     rectum revealed no abnormalities.    The scope was then withdrawn     from the patient and the procedure completed.           COMPLICATIONS:  None     ENDOSCOPIC IMPRESSION:     1) Severe diverticulosis in the sigmoid to descending colon     segments     2) No active  bleeding or blood in c     3) No polyps or cancers     "SEGMENTAL COLITIS" SEEN WITH SEVERE TICS,R/O IBD.     RECOMMENDATIONS:     1) Await biopsy results     2) high fiber diet     3) metamucil or benefiber     LIALDA 1.2 GM,2 TABS PO BID.SEE ME 3 WEEKS.     REPEAT EXAM:  No           ______________________________     Vania Rea. Jarold Motto, MD, [facility]           CC:  Reino Bellis, DO           n.     eSIGNED:   Vania Rea. Jamarious Febo at 01/29/2010 12:08 PM  [name], [name], [phone]  Note: An exclamation mark (!) indicates a result that was not dispersed into the flowsheet. Document Creation Date: 01/29/2010 12:09 PM _______________________________________________________________________  (1) Order result status: Final Collection or observation date-time: 01/29/2010 11:57 Requested date-time:  Receipt date-time:  Reported date-time:  Referring Physician:   Ordering Physician: Sheryn Bison [phone]) Specimen Source:  Source: Launa Grill Order Number: [id number] Lab site:

## 2010-02-15 NOTE — Letter (Signed)
Summary: New Patient letter  [facility]  [address] [place], [place] [address]   Phone: [phone]  Fax: [phone]       12/12/2009 MRN: [id number]  Lee Castillo [address] [place], [place]  [address]  Dear Lee Castillo,  Welcome to the Gastroenterology Division at [facility]    You are scheduled to see Dr. Jarold Motto on 01/23/2010 at 2:00PM on the 3rd floor at [facility], [address]  We ask that you try to arrive at our office 15 minutes prior to your appointment time to allow for check-in.  We would like you to complete the enclosed self-administered evaluation form prior to your visit and bring it with you on the day of your appointment.  We will review it with you.  Also, please bring a complete list of all your medications or, if you prefer, bring the medication bottles and we will list them.  Please bring your insurance card so that we may make a copy of it.  If your insurance requires a referral to see a specialist, please bring your referral form from your primary care physician.  Co-payments are due at the time of your visit and may be paid by cash, check or credit card.     Your office visit will consist of a consult with your physician (includes a physical exam), any laboratory testing he/she may order, scheduling of any necessary diagnostic testing (e.g. x-ray, ultrasound, CT-scan), and scheduling of a procedure (e.g. Endoscopy, Colonoscopy) if required.  Please allow enough time on your schedule to allow for any/all of these possibilities.    If you cannot keep your appointment, please call [phone] to cancel or reschedule prior to your appointment date.  This allows [place] the opportunity to schedule an appointment for another patient in need of care.  If you do not cancel or reschedule by 5 p.m. the business day prior to your appointment date, you will be charged a $50.00 late cancellation/no-show fee.    Thank you for  choosing [facility] for your medical needs.  We appreciate the opportunity to care for you.  Please visit [place] at our website  to learn more about our practice.                     Sincerely,                                                             The Gastroenterology Division

## 2010-02-15 NOTE — Letter (Signed)
Summary: Nat Math DO  T Ryan Draper DO   Imported By: Lester [place] 01/29/2010 10:15:04  _____________________________________________________________________  External Attachment:    Type:   Image     Comment:   External Document

## 2010-02-15 NOTE — Miscellaneous (Signed)
Summary: Lialda Rx  Per Dr Jarold Motto (he called from [facility]), he would like patient to have Lialda 2 tablets by mouth two times a day with 3 refills. Prescription has been sent and samples have been sent up to [facility] for patient. Dottie Nelson-Smith CMA Duncan Dull)  January 29, 2010 12:05 PM   Clinical Lists Changes  Medications: Added new medication of LIALDA 1.2 GM TBEC (MESALAMINE) Take 2 tablets by mouth two times a day - Signed Changed medication from LIALDA 1.2 GM TBEC (MESALAMINE) Take 2 tablets by mouth two times a day to LIALDA 1.2 GM TBEC (MESALAMINE) Take 2 tablets by mouth two times a day Rx of LIALDA 1.2 GM TBEC (MESALAMINE) Take 2 tablets by mouth two times a day;  #120 x 3;  Signed;  Entered by: Lamona Curl CMA (AAMA);  Authorized by: Mardella Layman MD [facility];  Method used: [facility]  [address], [place], [place]  [address], Ph: [phone] or [phone], Fax: [phone]    Prescriptions: LIALDA 1.2 GM TBEC (MESALAMINE) Take 2 tablets by mouth two times a day  #120 x 3   Entered by:   Lamona Curl CMA (AAMA)   Authorized by:   Mardella Layman MD [facility]   Signed by:   Lamona Curl CMA (AAMA) on 01/29/2010   Method used:   [facility]        [facility]  [address] (retail)       [address]       [place], [place]  [address]       Ph: [phone] or [phone]       Fax: [phone]   RxID:   [id number]

## 2010-02-15 NOTE — Discharge Summary (Signed)
[name], [name]              ACCOUNT NO.:  [id number]      MEDICAL RECORD NO.:  [id number]          PATIENT TYPE:  INP      LOCATION:  5127                         FACILITY:  [facility]      PHYSICIAN:  Peggye Pitt, M.D. DATE OF BIRTH:  17-Sep-1975      DATE OF ADMISSION:  09/13/2009   DATE OF DISCHARGE:  09/16/2009                                  DISCHARGE SUMMARY      PRIMARY CARE PHYSICIAN:  Frazier Butt, DO      DISCHARGE DIAGNOSES:   1. Recurrent, uncomplicated sigmoid diverticulitis.   2. Systemic inflammatory response syndrome, resolved.   3. History of gout.      DISCHARGE MEDICATIONS:  Include:   1. Cipro 500 mg twice daily for 21 days.   2. Flagyl 500 mg 3 times a day for 21 days.   3. Percocet 5/325 mg 1-2 tablets every 4 hours as needed for pain.   4. Allegra 180, 1 tablet daily.   5. Ibuprofen 200 mg 1-2 tablets every 4 hours as needed for pain.      DISPOSITION AND FOLLOWUP:  Lee Castillo will be discharged home today in   stable condition.  He is instructed to follow up with his PCP, Dr.   Margaretha Sheffield in approximately 3-4 weeks.  If he has any more episodes of   diverticulitis, then I would recommend referral to Surgery for   consideration of an elective sigmoid colectomy.      CONSULTATION THIS HOSPITALIZATION:  None.      IMAGES AND PROCEDURES:  Include a CT scan of the abdomen and pelvis on   August 31 that showed uncomplicated diverticulitis involving the upper   sigmoid colon.  An acute abdominal series on September 1 that showed   normal bowel gas pattern with low lung volumes and mild bibasilar   atelectasis.      HISTORY AND PHYSICAL:  For full details, see dictation by Dr. Delsa Grana on   August 31 but in brief [name] is a very pleasant 35 year old   Caucasian gentleman who came in with complaints of abdominal pain that   had been present for about 2 days involving the left lower quadrant.  He   has a known history of diverticulitis with last  hospitalization in March   2009.      HOSPITAL COURSE BY ACTIVE PROBLEM:   1. Recurrent, uncomplicated sigmoid diverticulitis.  He spent a day in       the step-down unit because of high fevers and concern for early       sepsis.  He was started on IV Cipro and Flagyl which has since been       transitioned to the oral route.  He has been tolerating solid food       for the past 24 hours, and we have deemed him stable for discharge       today.  Please note that at the time of discharge for the past 48       hours, he has not  had fever or leukocytosis.  If he has any       recurrent episodes of diverticulitis, then my recommendation would       be referral to Surgery for possible elective sigmoid colectomy.   2. Rest of his of chronic conditions have been stable.      VITAL SIGNS ON DAY OF DISCHARGE:  Blood pressure 108/65, heart rate 70,   respirations 16, sats 95% on room air, and temperature 98.7.               Peggye Pitt, M.D.            EH/MEDQ  D:  09/16/2009  T:  09/16/2009  Job:  [id number]      cc:   Frazier Butt, D.O.      Electronically Signed by Peggye Pitt M.D. on 10/12/2009 12:10:03 PM

## 2010-02-15 NOTE — Letter (Signed)
Summary: Nat Math DO  T Ryan Draper DO   Imported By: Lester [place] 01/29/2010 10:15:52  _____________________________________________________________________  External Attachment:    Type:   Image     Comment:   External Document

## 2010-02-20 ENCOUNTER — Ambulatory Visit: Payer: Self-pay | Admitting: Gastroenterology

## 2010-02-20 DIAGNOSIS — R69 Illness, unspecified: Secondary | ICD-10-CM

## 2010-02-21 NOTE — Letter (Signed)
Summary: Patient Notice- Colon Biospy Results  [facility]  [address] [place], [place] [address]   Phone: [phone]  Fax: [phone]        February 12, 2010 MRN: [id number]    Lee Castillo [address] [place], [place]  [address]    Dear [name],  I am pleased to inform you that the biopsies taken during your recent colonoscopy did not show any evidence of cancer upon pathologic examination.  Additional information/recommendations:  __No further action is needed at this time.  Please follow-up with      your primary care physician for your other healthcare needs.  XX__Please call [phone] to schedule a return visit to review      your condition.  X_Continue with the treatment plan as outlined on the day of your      exam.  __You should have a repeat colonoscopy examination for this problem           in _ years.  Please call us if you are having persistent problems or have questions about your condition that have not been fully answered at this time.  Sincerely,  Mardella Layman MD [facility]   This letter has been electronically signed by your physician.  Appended Document: Patient Notice- Colon Biospy Results LETTER MAILED

## 2010-02-22 ENCOUNTER — Telehealth (INDEPENDENT_AMBULATORY_CARE_PROVIDER_SITE_OTHER): Payer: Self-pay | Admitting: *Deleted

## 2010-02-22 ENCOUNTER — Telehealth: Payer: Self-pay | Admitting: Gastroenterology

## 2010-03-01 NOTE — Progress Notes (Signed)
Summary: ? re meds  Phone Note Call from Patient Call back at Home Phone [phone]   Caller: Patient Call For: Dr Jarold Motto Reason for Call: Talk to Nurse Summary of Call: Patient wants to speak to nurse regarding meds that his Orthopedic doc gave him. (prednisone)  Initial call taken by: Tawni Levy,  February 22, 2010 3:22 PM  Follow-up for Phone Call        Spoke with patient, he wanted to make sure it was ok to take the Prednisone his ortho doc prescribed. Wants to make sure it is ok with his other medication. Per [name], PA it is fine for him to take. Patient aware. Follow-up by: Selinda Michaels RN,  February 22, 2010 3:31 PM

## 2010-03-01 NOTE — Progress Notes (Signed)
Summary: Follow up needed  ---- Converted from flag ---- ---- 02/21/2010 11:18 AM, Harlow Mares CMA (AAMA) wrote: Left a message on patients machine to call back.   ---- 02/20/2010 1:00 PM, Harlow Mares CMA (AAMA) wrote: call pt to recsh offive visit. Left a message on patients machine to call back. ------------------------------ I have left several messages on the patients machine without a return call back I am going to mail him a letter.  Appended Document: Follow up needed appt schedule for 03/02/2010

## 2010-03-02 ENCOUNTER — Encounter: Payer: Self-pay | Admitting: Gastroenterology

## 2010-03-02 ENCOUNTER — Ambulatory Visit (INDEPENDENT_AMBULATORY_CARE_PROVIDER_SITE_OTHER): Payer: BC Managed Care – PPO | Admitting: Gastroenterology

## 2010-03-02 DIAGNOSIS — K5732 Diverticulitis of large intestine without perforation or abscess without bleeding: Secondary | ICD-10-CM | POA: Insufficient documentation

## 2010-03-07 NOTE — Assessment & Plan Note (Signed)
Summary: Follow up from colon    History of Present Illness Visit Type: Follow-up Visit Primary GI MD: Sheryn Bison MD FACP FAGA Primary Provider: Reino Bellis, MD Requesting Provider: na Chief Complaint: F/u from colon. Pt states that he is doing good and denies any GI complaints  History of Present Illness:   35 year old Caucasian male with recurrent diverticulitis. Recent colonoscopy showed evidence of segmental colitis confirmed by biopsies. He has been on oral amiodarone salicylate therapy with some improvement. He is not institute a high-fiber diet is suggested because of a recent breakup with his girlfriend. He also uses p.r.n. sublingual Levsin. He has had involuntary weight loss related to his psychological difficulties.   GI Review of Systems      Denies abdominal pain, acid reflux, belching, bloating, chest pain, dysphagia with liquids, dysphagia with solids, heartburn, loss of appetite, nausea, vomiting, vomiting blood, weight loss, and  weight gain.        Denies anal fissure, black tarry stools, change in bowel habit, constipation, diarrhea, diverticulosis, fecal incontinence, heme positive stool, hemorrhoids, irritable bowel syndrome, jaundice, light color stool, liver problems, rectal bleeding, and  rectal pain.    Current Medications (verified): 1)  Ibuprofen 200 Mg Tabs (Ibuprofen) .... Take 3 Tablets By Mouth As Needed 2)  Allegra Allergy 180 Mg Tabs (Fexofenadine Hcl) .... Take 1 Tablet By Mouth Once Daily As Needed 3)  Celebrex 200 Mg Caps (Celecoxib) .... Take One By Mouth Two Times A Day 4)  Levsin 0.125 Mg Tabs (Hyoscyamine Sulfate) .... Take One By Mouth Two Times A Day As Needed 5)  Lialda 1.2 Gm Tbec (Mesalamine) .... Takes One Tablet By Mouth Once Daily  Allergies (verified): No Known Drug Allergies  Past History:  Past medical, surgical, family and social histories (including risk factors) reviewed for relevance to current acute and chronic  problems.  Past Medical History: COLITIS (ICD-558.9) SPONDYLOSIS, LUMBAR (ICD-721.3) ABDOMINAL PAIN, LEFT LOWER QUADRANT (ICD-789.04) GOUT (ICD-274.9) DIVERTICULITIS, COLON, WITH PERFORATION (ICD-562.11) Disc Herniations A. Spondylosis  Past Surgical History: Reviewed history from 01/23/2010 and no changes required. Right Ankle Sugery  Family History: Reviewed history from 01/23/2010 and no changes required. No FH of Colon Cancer: Family History of Prostate Cancer: Father Family History of Stomach Cancer: MGM??? Family History of Diabetes: Father, Paternal Aunt, Paternal Uncle, Maternal Aunt, PGM Family History of Heart Disease: Father, MGM, MGF, PGM, PGF  Social History: Reviewed history from 01/23/2010 and no changes required. Patient has never smoked.  Alcohol Use - no Illicit Drug Use - no Occupation: Dentist Patient gets regular exercise.  Review of Systems       The patient complains of anxiety-new and depression-new.  The patient denies allergy/sinus, anemia, arthritis/joint pain, back pain, blood in urine, breast changes/lumps, change in vision, confusion, cough, coughing up blood, fainting, fatigue, fever, headaches-new, hearing problems, heart murmur, heart rhythm changes, itching, menstrual pain, muscle pains/cramps, night sweats, nosebleeds, pregnancy symptoms, shortness of breath, skin rash, sleeping problems, sore throat, swelling of feet/legs, swollen lymph glands, thirst - excessive , urination - excessive , urination changes/pain, urine leakage, vision changes, and voice change.    Vital Signs:  Patient profile:   35 year old male Height:      74 inches Weight:      288 pounds BMI:     37.11 BSA:     2.54 Pulse rate:   88 / minute Pulse rhythm:   regular BP sitting:   132 / 80  (left arm) Cuff  size:   regular  Vitals Entered By: Ok Anis CMA (March 02, 2010 11:37 AM)  Physical Exam  General:  Well developed, well nourished, no  acute distress.healthy appearing and obese.  BMI 37.11 Head:  Normocephalic and atraumatic. Eyes:  PERRLA, no icterus.exam deferred to patient's ophthalmologist.   Abdomen:  Soft, nontender and nondistended. No masses, hepatosplenomegaly or hernias noted. Normal bowel sounds.obese.   Psych:  Alert and cooperative. Normal mood and affect.   Impression & Recommendations:  Problem # 1:  DIVERTICULITIS-COLON (ICD-562.11) Assessment Improved Jshawn has recurrent diverticulitis with associated segmental colitis. He wishes surgical opinion concerning possible sigmoid resection, and I have referred him to Dr. Star Age at [facility] surgery for consideration of laparoscopic sigmoid resection. I have advised him to continue a high-fiber diet with daily Benefiber, liberal p.o. fluids, and to continue Lialda 2.4 g a day. Also continue use p.r.n. Levsin as needed.  Problem # 2:  ABDOMINAL PAIN, LEFT LOWER QUADRANT (ICD-789.04) Assessment: Improved  Patient Instructions: 1)  Copy sent to : Reino Bellis, MD & Karie Soda, MD 2)  Your prescription(s) have been sent to you pharmacy.  3)  We will call you about your appt with Dr. Michaell Cowing.  4)  The medication list was reviewed and reconciled.  All changed / newly prescribed medications were explained.  A complete medication list was provided to the patient / caregiver. Prescriptions: LIALDA 1.2 GM TBEC (MESALAMINE) take one by mouth two times a day  #60 x 3   Entered by:   Harlow Mares CMA (AAMA)   Authorized by:   Mardella Layman MD [facility]   Signed by:   Harlow Mares CMA (AAMA) on 03/02/2010   Method used:   [facility]  [address] [facility]       [address]       [place], [place]  [address]       Ph: [phone] or [phone]       Fax: [phone]   RxID:   [id number]   Appended Document: Orders Update records faxed.   Clinical Lists Changes  Orders: Added new Test order of [facility] ([facility]) - Signed

## 2010-03-29 LAB — CBC
HCT: 36.3 % — ABNORMAL LOW (ref 39.0–52.0)
Hemoglobin: 11.9 g/dL — ABNORMAL LOW (ref 13.0–17.0)
MCH: 29 pg (ref 26.0–34.0)
MCH: 29.1 pg (ref 26.0–34.0)
MCH: 29.3 pg (ref 26.0–34.0)
MCHC: 32.8 g/dL (ref 30.0–36.0)
MCV: 89.6 fL (ref 78.0–100.0)
MCV: 89.7 fL (ref 78.0–100.0)
Platelets: 155 10*3/uL (ref 150–400)
Platelets: 195 10*3/uL (ref 150–400)
RBC: 4.27 MIL/uL (ref 4.22–5.81)
RDW: 14.8 % (ref 11.5–15.5)
RDW: 15 % (ref 11.5–15.5)

## 2010-03-29 LAB — BASIC METABOLIC PANEL
BUN: 11 mg/dL (ref 6–23)
BUN: 9 mg/dL (ref 6–23)
CO2: 24 mEq/L (ref 19–32)
CO2: 25 mEq/L (ref 19–32)
Chloride: 107 mEq/L (ref 96–112)
Chloride: 110 mEq/L (ref 96–112)
Creatinine, Ser: 1 mg/dL (ref 0.4–1.5)
Creatinine, Ser: 1.18 mg/dL (ref 0.4–1.5)
Glucose, Bld: 106 mg/dL — ABNORMAL HIGH (ref 70–99)

## 2010-03-29 LAB — DIFFERENTIAL
Basophils Absolute: 0 10*3/uL (ref 0.0–0.1)
Basophils Relative: 0 % (ref 0–1)
Eosinophils Absolute: 0 10*3/uL (ref 0.0–0.7)
Eosinophils Absolute: 0.1 10*3/uL (ref 0.0–0.7)
Eosinophils Relative: 0 % (ref 0–5)
Eosinophils Relative: 2 % (ref 0–5)
Lymphs Abs: 0.5 10*3/uL — ABNORMAL LOW (ref 0.7–4.0)

## 2010-03-29 LAB — MRSA PCR SCREENING: MRSA by PCR: NEGATIVE

## 2010-03-29 LAB — MAGNESIUM: Magnesium: 1.7 mg/dL (ref 1.5–2.5)

## 2010-03-29 LAB — PROCALCITONIN: Procalcitonin: 4.09 ng/mL

## 2010-03-30 LAB — POCT I-STAT, CHEM 8
Hemoglobin: 16.3 g/dL (ref 13.0–17.0)
Sodium: 138 mEq/L (ref 135–145)
TCO2: 26 mmol/L (ref 0–100)

## 2010-03-30 LAB — CULTURE, BLOOD (ROUTINE X 2)

## 2010-03-30 LAB — URINALYSIS, ROUTINE W REFLEX MICROSCOPIC
Ketones, ur: NEGATIVE mg/dL
Protein, ur: NEGATIVE mg/dL
Urobilinogen, UA: 0.2 mg/dL (ref 0.0–1.0)

## 2010-03-30 LAB — URINE CULTURE

## 2010-03-30 LAB — TSH: TSH: 0.412 u[IU]/mL (ref 0.350–4.500)

## 2010-03-30 LAB — CBC
MCH: 30 pg (ref 26.0–34.0)
MCHC: 33.6 g/dL (ref 30.0–36.0)
MCV: 89.5 fL (ref 78.0–100.0)
Platelets: 196 10*3/uL (ref 150–400)
RDW: 14.3 % (ref 11.5–15.5)
WBC: 16.4 10*3/uL — ABNORMAL HIGH (ref 4.0–10.5)

## 2010-03-30 LAB — DIFFERENTIAL
Basophils Absolute: 0 10*3/uL (ref 0.0–0.1)
Basophils Relative: 0 % (ref 0–1)
Eosinophils Absolute: 0.1 10*3/uL (ref 0.0–0.7)
Eosinophils Relative: 1 % (ref 0–5)

## 2010-04-24 ENCOUNTER — Other Ambulatory Visit (HOSPITAL_COMMUNITY): Payer: Self-pay | Admitting: *Deleted

## 2010-04-24 DIAGNOSIS — K5792 Diverticulitis of intestine, part unspecified, without perforation or abscess without bleeding: Secondary | ICD-10-CM

## 2010-04-24 DIAGNOSIS — R1032 Left lower quadrant pain: Secondary | ICD-10-CM

## 2010-05-08 ENCOUNTER — Ambulatory Visit (HOSPITAL_COMMUNITY)
Admission: RE | Admit: 2010-05-08 | Discharge: 2010-05-08 | Disposition: A | Discharge status: Home / Self Care | Payer: BC Managed Care – PPO | Source: Ambulatory Visit | Attending: Surgery | Admitting: Surgery

## 2010-05-08 DIAGNOSIS — K5732 Diverticulitis of large intestine without perforation or abscess without bleeding: Secondary | ICD-10-CM | POA: Insufficient documentation

## 2010-05-08 DIAGNOSIS — R1032 Left lower quadrant pain: Secondary | ICD-10-CM | POA: Insufficient documentation

## 2010-05-08 DIAGNOSIS — K5792 Diverticulitis of intestine, part unspecified, without perforation or abscess without bleeding: Secondary | ICD-10-CM

## 2010-05-08 MED ORDER — IOHEXOL 300 MG/ML  SOLN
100.0000 mL | Freq: Once | INTRAMUSCULAR | Status: AC | PRN
  Administered 2010-05-08: 100 mL via INTRAVENOUS

## 2010-05-29 NOTE — H&P (Signed)
NAME:  Lee Castillo, Lee Castillo              ACCOUNT NO.:  [id number]   MEDICAL RECORD NO.:  [id number]          PATIENT TYPE:  INP   LOCATION:  5120                         FACILITY:  [facility]   PHYSICIAN:  Wayne A. Sheffield Slider, M.D.    DATE OF BIRTH:  November 25, 1975   DATE OF ADMISSION:  03/05/2007  DATE OF DISCHARGE:                              HISTORY & PHYSICAL   CHIEF COMPLAINT:  Abdominal pain.   HISTORY OF PRESENT ILLNESS:  This is a 35 year old male directly  admitted from Dr. Jonetta Osgood office after a perforated sigmoid  diverticulitis was found on abdominal CT.  Mr. Mayse reports that he  has had bilateral lower abdominal pain for the past 4 days that started  out intermittently and mild and has now progressed to 6/10 and constant  in nature.  He also developed some nausea today but no vomiting.  He is  febrile with T-max of 101.2 today.  Denies any diarrhea, constipation,  bloody stools, or history of diverticulosis.   PAST MEDICAL HISTORY:  1. Ruptured disk, L4-L5 and L5-S1.  2. Seasonal allergies.   PAST SURGICAL HISTORY:  None.   ALLERGIES:  NO KNOWN DRUG ALLERGIES.   MEDS:  He takes Fexofenadine p.r.n.   FAMILY HISTORY:  Dad has coronary artery disease status post CABG x9 and  diabetes along with diverticulosis.  Mom has coronary artery disease and  diabetes.   SOCIAL HISTORY:  He lives with his parents.  He works as an Pensions consultant for [employer].  No alcohol.  No tobacco.   REVIEW OF SYSTEMS:  GENERAL:  Positive.  He has had decrease in  appetite.  He has also had fevers.  RESPIRATORY:  No shortness of breath  or chest pain.  GU:  No dysuria.  GI:  No melena or hematochezia.   LABS:  Blood cultures pending.  Abdominal CT showed a ruptured sigmoid  diverticulitis with linear pocket of gas which is the preliminary read.   PHYSICAL EXAM:  VITAL SIGNS:  Pending.  GENERAL:  He is alert, looks uncomfortable, but oriented x3.  RESPIRATORY:  Clear to auscultation  bilaterally.  No increased work of  breathing.  ABDOMEN:  He is tender to palpation throughout.  No rebound.  No  guarding.  Positive bowel sounds and mild distention.  EXTREMITIES:  Show no edema.  RECTAL EXAM:  Fecal occult blood negative.  No hemorrhoids.   ASSESSMENT AND PLAN:  This is a 35 year old male with:  1. Ruptured diverticulitis.  We will start intravenous Ceftriaxone to      cover gram-negatives and intravenous Flagyl to cover anaerobes.  We      will also make n.p.o. for now and consult surgery.  We will consult      surgery as CT did show some thickening, inflammation, and free air.      Some signs of mild peritonitis but no rebound or guarding.  We will      check CBC and liver function tests.  Maybe able to treat medically      with antibiotics for 7 to 10 days.  We will await surgery's      recommendations.  He will need colonoscopy in 2 to 6 weeks after      resolution of symptoms to rule out neoplasm.  2. Pain control, morphine intravenous every 2 hours p.r.n.  3. Fluid, electrolytes, nutrition/gastrointestinal- n.p.o. for bowel      rest.  Transition to liquids as tolerated.  4. Disposition- pending surgery recs.      Ruthe Mannan, M.D.  Electronically Signed      Wayne A. Sheffield Slider, M.D.  Electronically Signed    TA/MEDQ  D:  03/05/2007  T:  03/06/2007  Job:  [id number]

## 2010-05-29 NOTE — Consult Note (Signed)
NAMEIGNACIO, LOWDER              ACCOUNT NO.:  [id number]   MEDICAL RECORD NO.:  [id number]          PATIENT TYPE:  INP   LOCATION:  5120                         FACILITY:  [facility]   PHYSICIAN:  Ollen Gross. Vernell Morgans, M.D. DATE OF BIRTH:  1975/04/26   DATE OF CONSULTATION:  03/05/2007  DATE OF DISCHARGE:                                 CONSULTATION   Mr. Ratterree is a 35 year old white male who presents with left lower  quadrant pain since Sunday.  The pain has gradually been getting worse.  He has run a low-grade fever today.  He denies any nausea or vomiting or  diarrhea.  His bowels have been moving pretty regular.  He has not  noticed any blood in his stools.  His other review of systems are  unremarkable.   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   PAST SURGICAL HISTORY:  None.   MEDICATIONS:  None.   ALLERGIES:  None.   SOCIAL HISTORY:  He denies use of tobacco products.   FAMILY HISTORY:  Noncontributory.   PHYSICAL EXAMINATION:  VITAL SIGNS:  Not been recorded yet.  GENERAL:  He is a well-developed, well-nourished, white male in no acute  distress.  SKIN:  Warm and dry.  No jaundice.  EYES:  Extraocular muscles are intact.  Pupils are equal, round, and  reactive to light.  Sclerae nonicteric.  LUNGS:  Clear bilaterally with no use of accessory muscles.  HEART:  Regular rate and rhythm with an impulse on the left chest.  ABDOMEN:  Soft.  He does have moderate tenderness in the left lower  quadrant but no signs of peritonitis.  EXTREMITIES:  No cyanosis, clubbing, or edema with good strength in his  arms and legs.  PSYCHOLOGIC:  He is alert and oriented x3 with no evidence today of  anxiety or depression.   LABORATORY:  All pending.   On review of his CT scan, he does have some sigmoid diverticulitis with  a small dot of extra luminal gas consistent with a small contained  microperforation.   ASSESSMENT/PLAN:  This is a 35 year old  white male with what appears to  be sigmoid diverticulitis with some evidence of small contained  microperforation.  Given how soft his abdomen is, I think it would be  reasonable at this point to put him on bowel rest, broad spectrum  antibiotics, and monitor him very closely.  If he improves, then we can  talk about whether we ought to engage in diet modification versus  elective surgery on his colon in 6-8 weeks  after the inflammation and infection have settled down.  If he does not  improve, then he may require exploration with possible colostomy at some  point.  He understands all this and agrees with this treatment plan.  We  will continue to follow him closely with you.      Ollen Gross. Vernell Morgans, M.D.  Electronically Signed     PST/MEDQ  D:  03/05/2007  T:  03/06/2007  Job:  [id number]

## 2010-06-01 NOTE — Discharge Summary (Signed)
NAME:  Lee Castillo, Lee Castillo              ACCOUNT NO.:  [id number]      MEDICAL RECORD NO.:  [id number]          PATIENT TYPE:  INP      LOCATION:  5154                         FACILITY:  [facility]      PHYSICIAN:  Todd D. McDiarmid, M.D.DATE OF BIRTH:  12/27/1975      DATE OF ADMISSION:  03/05/2007   DATE OF DISCHARGE:  03/09/2007                                  DISCHARGE SUMMARY      CONSULTANTS:  Surgery, Dr. Toth, and nutrition.      PROCEDURES:  CT abdomen.      REASON FOR ADMISSION:  The patient is a 35-year-old male admitted from   Dr. Weiner's office with bilateral abdominal pain for 4 days, found to   have perforated sigmoid diverticulitis on a CT scan.      PERTINENT ADMISSION LABS:  Include a CBC which showed a white blood cell   count of 17.5, hemoglobin of 13, hematocrit 38.8, and platelets 221.   Comprehensive metabolic panel shows sodium of 135, potassium 3.4,   chloride 103, bicarbonate 24, BUN 7, creatinine 0.88, glucose 131.   Liver functions were within normal limits with the exception of a total   bilirubin of 1.8.  Fecal occult blood was negative.  UA showed 15   ketones, 30 protein, 2.0 urobilinogen, but otherwise negative.      CT abdomen showed:   1. Acute diverticulitis with perforation, a small amount of       extraluminal gas was present, trace free fluid was detected, no       definite abscess seen.   2. Calcified disk herniations and spinal stenosis in the lumbar spine       was an incidental finding.      PRIMARY DISCHARGE DIAGNOSES:  Include:   1. Perforated sigmoid diverticulitis.   2. Allergic rhinitis.   3. Ruptured disk at L4-L5 and L5-S1.      DISCHARGE MEDICATIONS:  Include:   1. Flagyl 500 mg p.o. t.i.d.   2. Cipro 500 mg p.o. b.i.d.   3. Percocet 10/325 one tablet q. 6 h. p.r.n. for moderate to severe       pain.   4. Fexofenadine 180 mg p.o. daily.   5. Ibuprofen 400 mg p.o. p.r.n. for pain.      GENERAL HOSPITAL COURSE:  Perforated  diverticulitis.  The patient was   made NPO and placed on bowel rest.  Surgery was consulted and decided   that bowel rest would be the appropriate course rather than surgery.   The patient was started on IV ceftriaxone and IV Flagyl.  Prior to   discharge, this was changed to p.o. Cipro and p.o. Flagyl.  The   patient's pain did gradually get better.  He was able to eat.  We   controlled his pain with morphine and Toradol, and his white blood cell   count did decrease from the admission white blood cell count of 17.5 to   white blood cell count of 7.9 on discharge.  A blood culture showed no   growth   x2.      CONDITION AT TIME OF DISCHARGE:  Stable.      PENDING TESTS:  None.      DISPOSITION:  The patient was discharged home in stable condition.      FOLLOWUP APPOINTMENTS:  The patient is to make a followup appointment   with Dr. Draper, and the patient is to make a followup appointment with   Dr. Toth in 2 to 3 weeks.      FOLLOWUP ISSUES:  The patient will likely need a colonoscopy 2 to 6   weeks following discharge to rule out neoplasm.               Susan Overstreet, MD   Electronically Signed               Todd D. McDiarmid, M.D.   Electronically Signed         SO/MEDQ  D:  03/17/2007  T:  03/17/2007  Job:  [id number]      cc:   Paul S. Toth III, M.D.   Dr. Draper  

## 2010-08-03 ENCOUNTER — Other Ambulatory Visit: Payer: Self-pay | Admitting: Dermatology

## 2010-10-08 LAB — CBC
HCT: 38.8 — ABNORMAL LOW
Hemoglobin: 12 — ABNORMAL LOW
Hemoglobin: 12.8 — ABNORMAL LOW
MCHC: 33.6
MCHC: 33.7
MCHC: 34.1
MCV: 87.6
MCV: 88
MCV: 88.3
MCV: 88.3
Platelets: 221
Platelets: 320
RBC: 4.27
RBC: 4.32
RBC: 5.11
RDW: 13.5
RDW: 13.9
RDW: 14
WBC: 9.7

## 2010-10-08 LAB — DIFFERENTIAL
Basophils Relative: 1
Lymphocytes Relative: 26
Lymphs Abs: 2.5
Monocytes Absolute: 0.8
Monocytes Relative: 6
Monocytes Relative: 7
Neutro Abs: 9.2 — ABNORMAL HIGH
Neutro Abs: 6.2
Neutrophils Relative %: 64

## 2010-10-08 LAB — POCT CARDIAC MARKERS
CKMB, poc: 1.5
CKMB, poc: <1 — ABNORMAL LOW
Myoglobin, poc: 79.1
Operator id: 294511
Troponin i, poc: <0.05
Troponin i, poc: <0.05

## 2010-10-08 LAB — I-STAT 8, (EC8 V) (CONVERTED LAB)
Bicarbonate: 25.5 — ABNORMAL HIGH
HCT: 48
Hemoglobin: 16.3
Operator id: 294511
Sodium: 140
TCO2: 27
pCO2, Ven: 48.2

## 2010-10-08 LAB — URINALYSIS, ROUTINE W REFLEX MICROSCOPIC
Glucose, UA: NEGATIVE
Hgb urine dipstick: NEGATIVE
Ketones, ur: 15 — AB
Protein, ur: 30 — AB

## 2010-10-08 LAB — COMPREHENSIVE METABOLIC PANEL
Albumin: 3.5
BUN: 7
Creatinine, Ser: 0.88
Potassium: 3.4 — ABNORMAL LOW
Total Protein: 6.3

## 2010-10-08 LAB — CULTURE, BLOOD (ROUTINE X 2): Culture: NO GROWTH

## 2011-09-21 ENCOUNTER — Encounter (HOSPITAL_COMMUNITY): Payer: Self-pay | Admitting: Emergency Medicine

## 2011-09-21 ENCOUNTER — Emergency Department (HOSPITAL_COMMUNITY): Payer: No Typology Code available for payment source

## 2011-09-21 ENCOUNTER — Emergency Department (HOSPITAL_COMMUNITY)
Admission: EM | Admit: 2011-09-21 | Discharge: 2011-09-21 | Disposition: A | Discharge status: Home / Self Care | Payer: No Typology Code available for payment source | Attending: Emergency Medicine | Admitting: Emergency Medicine

## 2011-09-21 DIAGNOSIS — Z85850 Personal history of malignant neoplasm of thyroid: Secondary | ICD-10-CM | POA: Insufficient documentation

## 2011-09-21 DIAGNOSIS — Y92410 Unspecified street and highway as the place of occurrence of the external cause: Secondary | ICD-10-CM | POA: Insufficient documentation

## 2011-09-21 DIAGNOSIS — T07XXXA Unspecified multiple injuries, initial encounter: Secondary | ICD-10-CM | POA: Diagnosis present

## 2011-09-21 DIAGNOSIS — M545 Low back pain, unspecified: Secondary | ICD-10-CM | POA: Insufficient documentation

## 2011-09-21 DIAGNOSIS — M542 Cervicalgia: Secondary | ICD-10-CM | POA: Insufficient documentation

## 2011-09-21 DIAGNOSIS — M25519 Pain in unspecified shoulder: Secondary | ICD-10-CM | POA: Insufficient documentation

## 2011-09-21 DIAGNOSIS — M546 Pain in thoracic spine: Secondary | ICD-10-CM | POA: Insufficient documentation

## 2011-09-21 DIAGNOSIS — Z79899 Other long term (current) drug therapy: Secondary | ICD-10-CM | POA: Insufficient documentation

## 2011-09-21 HISTORY — DX: Diverticulitis of intestine, part unspecified, without perforation or abscess without bleeding: K57.92

## 2011-09-21 HISTORY — DX: Ankylosing spondylitis of unspecified sites in spine: M45.9

## 2011-09-21 HISTORY — DX: Malignant melanoma of skin, unspecified: C43.9

## 2011-09-21 HISTORY — DX: Malignant neoplasm of thyroid gland: C73

## 2011-09-21 HISTORY — DX: Umbilical hernia without obstruction or gangrene: K42.9

## 2011-09-21 MED ORDER — HYDROMORPHONE HCL PF 1 MG/ML IJ SOLN
1.0000 mg | Freq: Once | INTRAMUSCULAR | Status: AC
  Administered 2011-09-21: 1 mg via INTRAVENOUS
  Filled 2011-09-21: qty 1

## 2011-09-21 MED ORDER — HYDROMORPHONE HCL PF 1 MG/ML IJ SOLN: 0.5000 mg | Freq: Once | INTRAMUSCULAR | Status: DC

## 2011-09-21 NOTE — ED Notes (Signed)
PT ambulated with baseline gait; VSS; A&Ox3; no signs of distress; respirations even and unlabored; skin warm and dry; no questions upon discharge.  

## 2011-09-21 NOTE — ED Notes (Signed)
Pt has abrasion on the left knee and left forearm.
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
## 2011-09-21 NOTE — ED Notes (Signed)
Patient transported to X-ray 
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
## 2011-09-21 NOTE — ED Notes (Signed)
PT returned from xray; reporting pain all over muscle aches; head and left shoulder pain. Unrelieved by pain medicine given earlier; MD aware.
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
## 2012-01-17 ENCOUNTER — Other Ambulatory Visit: Payer: Self-pay | Admitting: Dermatology

## 2013-08-30 ENCOUNTER — Encounter: Payer: Self-pay | Admitting: Gastroenterology

## 2013-10-12 ENCOUNTER — Ambulatory Visit: Payer: BC Managed Care – PPO | Attending: Radiology | Admitting: Physical Therapy

## 2013-10-12 DIAGNOSIS — IMO0001 Reserved for inherently not codable concepts without codable children: Secondary | ICD-10-CM | POA: Insufficient documentation

## 2013-10-12 DIAGNOSIS — I890 Lymphedema, not elsewhere classified: Secondary | ICD-10-CM | POA: Diagnosis not present

## 2013-10-12 DIAGNOSIS — M25519 Pain in unspecified shoulder: Secondary | ICD-10-CM | POA: Diagnosis not present

## 2013-10-19 ENCOUNTER — Ambulatory Visit: Payer: BC Managed Care – PPO | Attending: Radiology | Admitting: Physical Therapy

## 2013-10-19 DIAGNOSIS — Z5189 Encounter for other specified aftercare: Secondary | ICD-10-CM | POA: Insufficient documentation

## 2013-10-19 DIAGNOSIS — M25511 Pain in right shoulder: Secondary | ICD-10-CM | POA: Diagnosis not present

## 2013-10-19 DIAGNOSIS — I890 Lymphedema, not elsewhere classified: Secondary | ICD-10-CM | POA: Diagnosis not present

## 2013-10-20 ENCOUNTER — Ambulatory Visit: Payer: BC Managed Care – PPO | Admitting: Physical Therapy

## 2013-10-20 ENCOUNTER — Encounter: Payer: BC Managed Care – PPO | Admitting: Physical Therapy

## 2013-10-20 DIAGNOSIS — Z5189 Encounter for other specified aftercare: Secondary | ICD-10-CM | POA: Diagnosis not present

## 2013-10-21 ENCOUNTER — Ambulatory Visit: Payer: BC Managed Care – PPO | Admitting: Physical Therapy

## 2013-10-26 ENCOUNTER — Ambulatory Visit: Payer: BC Managed Care – PPO | Admitting: Physical Therapy

## 2013-10-26 DIAGNOSIS — Z5189 Encounter for other specified aftercare: Secondary | ICD-10-CM | POA: Diagnosis not present

## 2013-10-29 ENCOUNTER — Ambulatory Visit: Payer: BC Managed Care – PPO | Admitting: Physical Therapy

## 2013-10-29 DIAGNOSIS — Z5189 Encounter for other specified aftercare: Secondary | ICD-10-CM | POA: Diagnosis not present

## 2013-11-01 ENCOUNTER — Ambulatory Visit: Payer: BC Managed Care – PPO

## 2013-11-01 DIAGNOSIS — Z5189 Encounter for other specified aftercare: Secondary | ICD-10-CM | POA: Diagnosis not present

## 2013-11-03 ENCOUNTER — Ambulatory Visit: Payer: BC Managed Care – PPO

## 2013-11-03 DIAGNOSIS — Z5189 Encounter for other specified aftercare: Secondary | ICD-10-CM | POA: Diagnosis not present

## 2013-11-08 ENCOUNTER — Ambulatory Visit: Payer: BC Managed Care – PPO | Admitting: Physical Therapy

## 2013-11-08 DIAGNOSIS — Z5189 Encounter for other specified aftercare: Secondary | ICD-10-CM | POA: Diagnosis not present

## 2013-11-10 ENCOUNTER — Ambulatory Visit: Payer: BC Managed Care – PPO | Admitting: Physical Therapy

## 2013-11-10 DIAGNOSIS — Z5189 Encounter for other specified aftercare: Secondary | ICD-10-CM | POA: Diagnosis not present

## 2013-11-23 ENCOUNTER — Ambulatory Visit: Payer: BC Managed Care – PPO | Attending: Radiology

## 2013-11-23 DIAGNOSIS — M25511 Pain in right shoulder: Secondary | ICD-10-CM | POA: Insufficient documentation

## 2013-11-23 DIAGNOSIS — Z5189 Encounter for other specified aftercare: Secondary | ICD-10-CM | POA: Diagnosis present

## 2013-11-23 DIAGNOSIS — I890 Lymphedema, not elsewhere classified: Secondary | ICD-10-CM | POA: Diagnosis not present
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
## 2013-11-25 ENCOUNTER — Ambulatory Visit: Payer: BC Managed Care – PPO | Admitting: Physical Therapy

## 2013-11-25 DIAGNOSIS — I890 Lymphedema, not elsewhere classified: Secondary | ICD-10-CM

## 2013-11-25 DIAGNOSIS — M25511 Pain in right shoulder: Secondary | ICD-10-CM

## 2013-11-25 DIAGNOSIS — Z5189 Encounter for other specified aftercare: Secondary | ICD-10-CM | POA: Diagnosis not present
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
## 2013-11-29 ENCOUNTER — Ambulatory Visit: Payer: BC Managed Care – PPO | Admitting: Physical Therapy

## 2013-11-29 NOTE — Addendum Note (Signed)
Addended by: Kipp Laurence on: 11/29/2013 12:53 PM   Modules accepted: Orders

## 2013-12-01 ENCOUNTER — Ambulatory Visit: Payer: BC Managed Care – PPO | Admitting: Physical Therapy

## 2013-12-01 DIAGNOSIS — I890 Lymphedema, not elsewhere classified: Secondary | ICD-10-CM

## 2013-12-01 DIAGNOSIS — Z5189 Encounter for other specified aftercare: Secondary | ICD-10-CM | POA: Diagnosis not present
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
## 2013-12-06 ENCOUNTER — Ambulatory Visit: Payer: BC Managed Care – PPO

## 2013-12-06 DIAGNOSIS — Z5189 Encounter for other specified aftercare: Secondary | ICD-10-CM | POA: Diagnosis not present

## 2013-12-06 DIAGNOSIS — M25511 Pain in right shoulder: Secondary | ICD-10-CM

## 2013-12-06 DIAGNOSIS — I890 Lymphedema, not elsewhere classified: Secondary | ICD-10-CM
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
## 2013-12-08 ENCOUNTER — Ambulatory Visit: Payer: BC Managed Care – PPO

## 2013-12-08 DIAGNOSIS — Z5189 Encounter for other specified aftercare: Secondary | ICD-10-CM | POA: Diagnosis not present

## 2013-12-08 DIAGNOSIS — I890 Lymphedema, not elsewhere classified: Secondary | ICD-10-CM

## 2013-12-08 DIAGNOSIS — M25511 Pain in right shoulder: Secondary | ICD-10-CM
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
## 2013-12-13 ENCOUNTER — Ambulatory Visit: Payer: BC Managed Care – PPO

## 2013-12-13 DIAGNOSIS — Z5189 Encounter for other specified aftercare: Secondary | ICD-10-CM | POA: Diagnosis not present

## 2013-12-13 DIAGNOSIS — I890 Lymphedema, not elsewhere classified: Secondary | ICD-10-CM

## 2013-12-13 DIAGNOSIS — M25511 Pain in right shoulder: Secondary | ICD-10-CM
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
## 2013-12-15 ENCOUNTER — Ambulatory Visit: Payer: BC Managed Care – PPO | Attending: Radiology

## 2013-12-15 DIAGNOSIS — Z5189 Encounter for other specified aftercare: Secondary | ICD-10-CM | POA: Insufficient documentation

## 2013-12-15 DIAGNOSIS — I890 Lymphedema, not elsewhere classified: Secondary | ICD-10-CM | POA: Diagnosis not present

## 2013-12-15 DIAGNOSIS — M25511 Pain in right shoulder: Secondary | ICD-10-CM | POA: Diagnosis not present
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
## 2013-12-23 ENCOUNTER — Ambulatory Visit: Payer: BC Managed Care – PPO | Admitting: Physical Therapy

## 2013-12-23 DIAGNOSIS — I890 Lymphedema, not elsewhere classified: Secondary | ICD-10-CM

## 2013-12-23 DIAGNOSIS — Z5189 Encounter for other specified aftercare: Secondary | ICD-10-CM | POA: Diagnosis not present

## 2013-12-23 DIAGNOSIS — M25511 Pain in right shoulder: Secondary | ICD-10-CM
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
## 2014-01-04 ENCOUNTER — Ambulatory Visit: Payer: BC Managed Care – PPO

## 2014-01-04 DIAGNOSIS — Z5189 Encounter for other specified aftercare: Secondary | ICD-10-CM | POA: Diagnosis not present

## 2014-01-04 DIAGNOSIS — M25511 Pain in right shoulder: Secondary | ICD-10-CM

## 2014-01-04 DIAGNOSIS — I890 Lymphedema, not elsewhere classified: Secondary | ICD-10-CM
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
## 2014-01-10 ENCOUNTER — Ambulatory Visit: Payer: BC Managed Care – PPO | Admitting: Physical Therapy

## 2014-01-12 ENCOUNTER — Other Ambulatory Visit: Payer: Self-pay | Admitting: Dermatology

## 2014-01-12 ENCOUNTER — Ambulatory Visit: Payer: BC Managed Care – PPO

## 2014-01-12 DIAGNOSIS — Z5189 Encounter for other specified aftercare: Secondary | ICD-10-CM | POA: Diagnosis not present

## 2014-01-12 DIAGNOSIS — M25511 Pain in right shoulder: Secondary | ICD-10-CM

## 2014-01-12 DIAGNOSIS — I890 Lymphedema, not elsewhere classified: Secondary | ICD-10-CM
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
## 2015-04-12 ENCOUNTER — Encounter (HOSPITAL_COMMUNITY): Payer: Self-pay | Admitting: Emergency Medicine

## 2015-04-12 ENCOUNTER — Emergency Department (HOSPITAL_COMMUNITY)
Admission: EM | Admit: 2015-04-12 | Discharge: 2015-04-13 | Disposition: A | Discharge status: Other Acute Inpatient Hospital | Payer: BC Managed Care – PPO | Attending: Emergency Medicine | Admitting: Emergency Medicine

## 2015-04-12 DIAGNOSIS — Z0289 Encounter for other administrative examinations: Secondary | ICD-10-CM | POA: Diagnosis not present

## 2015-04-12 DIAGNOSIS — F329 Major depressive disorder, single episode, unspecified: Secondary | ICD-10-CM | POA: Diagnosis not present

## 2015-04-12 DIAGNOSIS — R45851 Suicidal ideations: Secondary | ICD-10-CM | POA: Diagnosis present

## 2015-04-12 DIAGNOSIS — F32A Depression, unspecified: Secondary | ICD-10-CM

## 2015-04-12 DIAGNOSIS — Z85850 Personal history of malignant neoplasm of thyroid: Secondary | ICD-10-CM | POA: Insufficient documentation

## 2015-04-12 DIAGNOSIS — Z8719 Personal history of other diseases of the digestive system: Secondary | ICD-10-CM | POA: Diagnosis not present

## 2015-04-12 DIAGNOSIS — Z046 Encounter for general psychiatric examination, requested by authority: Secondary | ICD-10-CM

## 2015-04-12 DIAGNOSIS — Z85820 Personal history of malignant melanoma of skin: Secondary | ICD-10-CM | POA: Insufficient documentation

## 2015-04-12 DIAGNOSIS — Z79899 Other long term (current) drug therapy: Secondary | ICD-10-CM | POA: Insufficient documentation

## 2015-04-12 LAB — COMPREHENSIVE METABOLIC PANEL
ALT: 20 U/L (ref 17–63)
AST: 23 U/L (ref 15–41)
Albumin: 4.9 g/dL (ref 3.5–5.0)
Alkaline Phosphatase: 68 U/L (ref 38–126)
Anion gap: 13 (ref 5–15)
BUN: 11 mg/dL (ref 6–20)
CHLORIDE: 102 mmol/L (ref 101–111)
CO2: 24 mmol/L (ref 22–32)
Calcium: 8.1 mg/dL — ABNORMAL LOW (ref 8.9–10.3)
Creatinine, Ser: 0.84 mg/dL (ref 0.61–1.24)
Glucose, Bld: 102 mg/dL — ABNORMAL HIGH (ref 65–99)
POTASSIUM: 3.6 mmol/L (ref 3.5–5.1)
SODIUM: 139 mmol/L (ref 135–145)
Total Bilirubin: 0.8 mg/dL (ref 0.3–1.2)
Total Protein: 8.2 g/dL — ABNORMAL HIGH (ref 6.5–8.1)

## 2015-04-12 LAB — CBC
HCT: 45.6 % (ref 39.0–52.0)
HEMOGLOBIN: 15.4 g/dL (ref 13.0–17.0)
MCH: 28.2 pg (ref 26.0–34.0)
MCHC: 33.8 g/dL (ref 30.0–36.0)
MCV: 83.4 fL (ref 78.0–100.0)
PLATELETS: 297 10*3/uL (ref 150–400)
RBC: 5.47 MIL/uL (ref 4.22–5.81)
RDW: 15.2 % (ref 11.5–15.5)
WBC: 10.2 10*3/uL (ref 4.0–10.5)

## 2015-04-12 LAB — ACETAMINOPHEN LEVEL: Acetaminophen (Tylenol), Serum: <10 ug/mL — ABNORMAL LOW (ref 10–30)

## 2015-04-12 LAB — RAPID URINE DRUG SCREEN, HOSP PERFORMED
AMPHETAMINES: NOT DETECTED
BENZODIAZEPINES: NOT DETECTED
Barbiturates: NOT DETECTED
Cocaine: NOT DETECTED
OPIATES: NOT DETECTED
Tetrahydrocannabinol: NOT DETECTED

## 2015-04-12 LAB — SALICYLATE LEVEL

## 2015-04-12 LAB — ETHANOL

## 2015-04-12 MED ORDER — ONDANSETRON HCL 4 MG PO TABS
4.0000 mg | ORAL_TABLET | Freq: Three times a day (TID) | ORAL | Status: DC | PRN
Start: 2015-04-12 — End: 2015-04-13

## 2015-04-12 MED ORDER — LEVOTHYROXINE SODIUM 125 MCG PO TABS
250.0000 ug | ORAL_TABLET | Freq: Every day | ORAL | Status: DC
  Administered 2015-04-13: 250 ug via ORAL
  Filled 2015-04-12 (×2): qty 2

## 2015-04-12 MED ORDER — LORAZEPAM 1 MG PO TABS: 1.0000 mg | ORAL_TABLET | Freq: Three times a day (TID) | ORAL | Status: DC | PRN

## 2015-04-12 MED ORDER — IBUPROFEN 200 MG PO TABS
600.0000 mg | ORAL_TABLET | Freq: Three times a day (TID) | ORAL | Status: DC | PRN
  Administered 2015-04-12: 600 mg via ORAL
  Filled 2015-04-12: qty 3

## 2015-04-12 MED ORDER — ZOLPIDEM TARTRATE 5 MG PO TABS: 5.0000 mg | ORAL_TABLET | Freq: Every evening | ORAL | Status: DC | PRN

## 2015-04-12 MED ORDER — ALUM & MAG HYDROXIDE-SIMETH 200-200-20 MG/5ML PO SUSP: 30.0000 mL | ORAL | Status: DC | PRN

## 2015-04-12 MED ORDER — ACETAMINOPHEN 325 MG PO TABS: 650.0000 mg | ORAL_TABLET | ORAL | Status: DC | PRN

## 2015-04-12 MED ORDER — NICOTINE 21 MG/24HR TD PT24: 21.0000 mg | MEDICATED_PATCH | Freq: Every day | TRANSDERMAL | Status: DC

## 2015-04-12 MED ORDER — LEVOTHYROXINE SODIUM 175 MCG PO TABS: 175.0000 ug | ORAL_TABLET | Freq: Every day | ORAL | Status: DC

## 2015-04-12 MED ORDER — CALCITRIOL 0.25 MCG PO CAPS
0.2500 ug | ORAL_CAPSULE | Freq: Two times a day (BID) | ORAL | Status: DC
  Administered 2015-04-12 – 2015-04-13 (×2): 0.25 ug via ORAL
  Filled 2015-04-12 (×3): qty 1

## 2015-04-12 NOTE — ED Notes (Signed)
Bed: [facility] Expected date:  Expected time:  Means of arrival:  Comments: Hold for traige 4.

## 2015-04-12 NOTE — BH Assessment (Signed)
Assessment completed. Consulted Patriciaann Clan, PA-C who recommended inpatient treatment. TTS to seek placement. Informed Margarita Mail, PA-C of the recommendation.

## 2015-04-12 NOTE — ED Provider Notes (Signed)
CSN: [id number]     Arrival date & time 04/12/15  1920 History   First MD Initiated Contact with Patient 04/12/15 1957     No chief complaint on file.    (Consider location/radiation/quality/duration/timing/severity/associated sxs/prior Treatment) HPI This is a 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 who is here under involuntary commitment for threatening suicide. According to the IVC paperwork the patient stated that he wanted to shoot himself in the head and just crawl into a hole and died. He does have access to a weapon and told this to a friend who is a Curator. The patient tells me that he is currently in some "legal trouble." He states that he just feels angry because this is his own fault. He denies a past history of depression for which he has ever been hospitalized. He denies previous history of thoughts of suicide, audiovisual hallucinations, alcohol or drug abuse. Patient states it he would not actually act on his thoughts.  Past Medical History  Diagnosis Date  . Spondylitis, ankylosing ([place])   . Melanoma ([place])   . Thyroid cancer ([place])   . Umbilical hernia   . Diverticulitis    Past Surgical History  Procedure Laterality Date  . Colon surgery    . Thyroid surgery    . Head surgery     No family history on file. Social History  Substance Use Topics  . Smoking status: Never Smoker   . Smokeless tobacco: None  . Alcohol Use: Yes    Review of Systems Ten systems reviewed and are negative for acute change, except as noted in the HPI.   Allergies  Coconut flavor and Compazine  Home Medications   Prior to Admission medications   Medication Sig Start Date End Date Taking? Authorizing Provider  calcitRIOL (ROCALTROL) 0.25 MCG capsule Take 0.25 mcg by mouth 2 (two) times daily.   Yes Historical Provider, MD  ibuprofen (ADVIL,MOTRIN) 200 MG tablet Take 400 mg by mouth every 6 (six) hours as needed for moderate pain.   Yes Historical Provider, MD  levothyroxine (SYNTHROID,  LEVOTHROID) 125 MCG tablet Take 250 mcg by mouth daily before breakfast.   Yes Historical Provider, MD  levothyroxine (SYNTHROID, LEVOTHROID) 175 MCG tablet Take 175 mcg by mouth daily.   Yes Historical Provider, MD   BP 143/97 mmHg  Temp(Src) 98.2 F (36.8 C) (Oral)  Resp 18  SpO2 98% Physical Exam  Constitutional: He is oriented to person, place, and time. He appears well-developed and well-nourished. No distress.  HENT:  Head: Normocephalic and atraumatic.  Eyes: Conjunctivae are normal. No scleral icterus.  Neck: Normal range of motion. Neck supple.  Cardiovascular: Normal rate, regular rhythm and normal heart sounds.   Pulmonary/Chest: Effort normal and breath sounds normal. No respiratory distress.  Abdominal: Soft. There is no tenderness.  Musculoskeletal: He exhibits no edema.  Neurological: He is alert and oriented to person, place, and time.  Skin: Skin is warm and dry. He is not diaphoretic.  Psychiatric: His behavior is normal. Thought content normal.  Flat affect   Nursing note and vitals reviewed.   ED Course  Procedures (including critical care time) Labs Review Labs Reviewed  COMPREHENSIVE METABOLIC PANEL - Abnormal; Notable for the following:    Glucose, Bld 102 (*)    Calcium 8.1 (*)    Total Protein 8.2 (*)    All other components within normal limits  ACETAMINOPHEN LEVEL - Abnormal; Notable for the following:    Acetaminophen (Tylenol), Serum <10 (*)  All other components within normal limits  ETHANOL  SALICYLATE LEVEL  CBC  URINE RAPID DRUG SCREEN, HOSP PERFORMED    Imaging Review No results found. I have personally reviewed and evaluated these images and lab results as part of my medical decision-making.   EKG Interpretation None      MDM   Final diagnoses:  Involuntary commitment  Depression  Suicidal ideation   Patient medically clear. I have spoken with TTS who states that the patient meets inpatient criteria and will look for  placement. Margarita Mail, PA-C 04/12/15 [place], DO 04/12/15 2351

## 2015-04-12 NOTE — ED Notes (Signed)
Patient presents for SI. States he told a friend "I just told a buddy that I wanted to crawl into a hole and die, but I wouldn't hurt myself". Denies HI/AVH. Denies ETOH or substance abuse. C/o HA and nausea. Denies other c/c.

## 2015-04-12 NOTE — BH Assessment (Addendum)
Tele Assessment Note   Lee Castillo is an 40 y.o. male presenting to [facility] due to being petitioned for involuntary commitment due to verbalizing suicidal ideations. Pt stated "I am dealing with legal issues and was talking with a friend and I said some things and he alerted the sheriff department". "I told him that I wanted to crawl in a hole and die, it would be easier to bite a bullet than deal with it". Pt did not disclose the legal issues he is dealing with only stated that he has not been charged. Pt did not report any previous suicide attempts or self-injurious behaviors at this time. Pt denies HI and AVH at this time. Pt is endorsing multiple depressive symptoms and shared that his sleep has been poor. Pt did not report any issues with his appetite. Pt did not report any drug or alcohol use. Pt reported that he had access to weapons but reported that he turned them over to his father. Pt denied physical, sexual and emotional abuse at this time.   Inpatient treatment is recommended.   Diagnosis: Major Depressive disorder, single episode   Past Medical History:  Past Medical History  Diagnosis Date  . Spondylitis, ankylosing ([place])   . Melanoma ([place])   . Thyroid cancer ([place])   . Umbilical hernia   . Diverticulitis     Past Surgical History  Procedure Laterality Date  . Colon surgery    . Thyroid surgery    . Head surgery      Family History: No family history on file.  Social History:  reports that he has never smoked. He does not have any smokeless tobacco history on file. He reports that he drinks alcohol. His drug history is not on file.  Additional Social History:  Alcohol / Drug Use History of alcohol / drug use?: No history of alcohol / drug abuse  CIWA: CIWA-Ar BP: 143/97 mmHg COWS:    PATIENT STRENGTHS: (choose at least two) Average or above average intelligence Capable of independent living Communication skills  Allergies:  Allergies  Allergen Reactions  .  Coconut Flavor     coconut  . Compazine [Prochlorperazine]     Home Medications:  (Not in a hospital admission)  OB/GYN Status:  No LMP for male patient.  General Assessment Data Location of Assessment: [facility] TTS Assessment: In system Is this a Tele or Face-to-Face Assessment?: Face-to-Face Is this an Initial Assessment or a Re-assessment for this encounter?: Initial Assessment Living Arrangements: Alone Can pt return to current living arrangement?: Yes Admission Status: Involuntary Is patient capable of signing voluntary admission?: Yes Referral Source: Other ([employer]) Insurance type: BCBS     Crisis Care Plan Living Arrangements: Alone Name of Psychiatrist: No provider reported.  Name of Therapist: No provider reported   Education Status Is patient currently in school?: No  Risk to self with the past 6 months Suicidal Ideation: Yes-Currently Present Has patient been a risk to self within the past 6 months prior to admission? : No Suicidal Intent: No-Not Currently/Within Last 6 Months Has patient had any suicidal intent within the past 6 months prior to admission? : No Is patient at risk for suicide?: Yes Suicidal Plan?: No-Not Currently/Within Last 6 Months Has patient had any suicidal plan within the past 6 months prior to admission? : No Access to Means: Yes Specify Access to Suicidal Means: access to weapons What has been your use of drugs/alcohol within the last 12 months?: Pt denies alcohol and  drug use.  Previous Attempts/Gestures: No How many times?: 0 Other Self Harm Risks: Pt denies  Triggers for Past Attempts: None known Intentional Self Injurious Behavior: None Family Suicide History: No Recent stressful life event(s): Other (Comment) (pending criminal charges) Persecutory voices/beliefs?: No Depression: Yes Depression Symptoms: Despondent, Isolating, Fatigue, Loss of interest in usual pleasures, Feeling worthless/self pity, Feeling angry/irritable,  Guilt Substance abuse history and/or treatment for substance abuse?: No Suicide prevention information given to non-admitted patients: Not applicable  Risk to Others within the past 6 months Homicidal Ideation: No Does patient have any lifetime risk of violence toward others beyond the six months prior to admission? : No Thoughts of Harm to Others: No Current Homicidal Intent: No Current Homicidal Plan: No Access to Homicidal Means: No Identified Victim: N/A History of harm to others?: No Assessment of Violence: None Noted Violent Behavior Description: No violent behaviors reported.  Does patient have access to weapons?: No Criminal Charges Pending?: No Does patient have a court date: No Is patient on probation?: No  Psychosis Hallucinations: None noted Delusions: None noted  Mental Status Report Appearance/Hygiene: In scrubs Eye Contact: Good Motor Activity: Freedom of movement Speech: Logical/coherent Level of Consciousness: Quiet/awake Mood: Depressed Affect: Appropriate to circumstance Anxiety Level: Minimal Thought Processes: Relevant, Coherent Judgement: Unimpaired Orientation: Appropriate for developmental age, Person, Place, Situation, Time Obsessive Compulsive Thoughts/Behaviors: None  Cognitive Functioning Concentration: Fair Memory: Remote Intact, Recent Intact IQ: Average Insight: Fair Impulse Control: Fair Appetite: Good Weight Loss: 0 Weight Gain: 0 Sleep: Decreased Total Hours of Sleep: 5 Vegetative Symptoms: None  ADLScreening [facility] Assessment Services) Patient's cognitive ability adequate to safely complete daily activities?: Yes Patient able to express need for assistance with ADLs?: Yes Independently performs ADLs?: Yes (appropriate for developmental age)  Prior Inpatient Therapy Prior Inpatient Therapy: No  Prior Outpatient Therapy Prior Outpatient Therapy: No Does patient have an ACCT team?: No Does patient have Intensive In-House  Services?  : No Does patient have [facility] services? : No Does patient have P4CC services?: No  ADL Screening (condition at time of admission) Patient's cognitive ability adequate to safely complete daily activities?: Yes Is the patient deaf or have difficulty hearing?: No Does the patient have difficulty seeing, even when wearing glasses/contacts?: No Does the patient have difficulty concentrating, remembering, or making decisions?: No Patient able to express need for assistance with ADLs?: Yes Does the patient have difficulty dressing or bathing?: No Independently performs ADLs?: Yes (appropriate for developmental age) Does the patient have difficulty walking or climbing stairs?: No       Abuse/Neglect Assessment (Assessment to be complete while patient is alone) Physical Abuse: Denies Verbal Abuse: Denies Sexual Abuse: Denies Exploitation of patient/patient's resources: Denies Self-Neglect: Denies     Regulatory affairs officer (For Healthcare) Does patient have an advance directive?: No Would patient like information on creating an advanced directive?: No - patient declined information    Additional Information 1:1 In Past 12 Months?: No CIRT Risk: No Elopement Risk: No Does patient have medical clearance?: Yes     Disposition: Inpatient treatment is recommended.  Disposition Initial Assessment Completed for this Encounter: Yes  Kamber Vignola S 04/12/2015 10:30 PM

## 2015-04-12 NOTE — ED Notes (Signed)
Patient received calm and cooperative with nursing assessment. Patient currently denies pain, depression, A/ VH, endorses some anxiety but "not bad" Patient contracts for safety and MAR reviewed with patient. Q 15 min checks initiated.

## 2015-04-13 ENCOUNTER — Inpatient Hospital Stay (HOSPITAL_COMMUNITY)
Admission: AD | Admit: 2015-04-13 | Discharge: 2015-04-18 | DRG: 885 | Disposition: A | Discharge status: Home / Self Care | Payer: BC Managed Care – PPO | Source: Intra-hospital | Attending: Psychiatry | Admitting: Psychiatry

## 2015-04-13 ENCOUNTER — Encounter (HOSPITAL_COMMUNITY): Payer: Self-pay | Admitting: *Deleted

## 2015-04-13 DIAGNOSIS — Z79899 Other long term (current) drug therapy: Secondary | ICD-10-CM

## 2015-04-13 DIAGNOSIS — R45851 Suicidal ideations: Secondary | ICD-10-CM | POA: Diagnosis present

## 2015-04-13 DIAGNOSIS — F332 Major depressive disorder, recurrent severe without psychotic features: Secondary | ICD-10-CM | POA: Diagnosis present

## 2015-04-13 DIAGNOSIS — Z85850 Personal history of malignant neoplasm of thyroid: Secondary | ICD-10-CM | POA: Diagnosis not present

## 2015-04-13 DIAGNOSIS — F329 Major depressive disorder, single episode, unspecified: Secondary | ICD-10-CM

## 2015-04-13 DIAGNOSIS — Z85820 Personal history of malignant melanoma of skin: Secondary | ICD-10-CM | POA: Diagnosis not present

## 2015-04-13 DIAGNOSIS — F32A Depression, unspecified: Secondary | ICD-10-CM | POA: Diagnosis present

## 2015-04-13 DIAGNOSIS — G4700 Insomnia, unspecified: Secondary | ICD-10-CM | POA: Diagnosis present

## 2015-04-13 DIAGNOSIS — F419 Anxiety disorder, unspecified: Secondary | ICD-10-CM | POA: Diagnosis present

## 2015-04-13 MED ORDER — ALUM & MAG HYDROXIDE-SIMETH 200-200-20 MG/5ML PO SUSP: 30.0000 mL | ORAL | Status: DC | PRN

## 2015-04-13 MED ORDER — TRAZODONE HCL 50 MG PO TABS: 50.0000 mg | ORAL_TABLET | Freq: Every day | ORAL | Status: DC

## 2015-04-13 MED ORDER — TRAZODONE HCL 50 MG PO TABS
50.0000 mg | ORAL_TABLET | Freq: Every day | ORAL | Status: DC
  Filled 2015-04-13 (×2): qty 1

## 2015-04-13 MED ORDER — ACETAMINOPHEN 325 MG PO TABS
650.0000 mg | ORAL_TABLET | ORAL | Status: DC | PRN
Start: 2015-04-13 — End: 2015-04-18

## 2015-04-13 MED ORDER — ZOLPIDEM TARTRATE 5 MG PO TABS: 5.0000 mg | ORAL_TABLET | Freq: Every evening | ORAL | Status: DC | PRN

## 2015-04-13 MED ORDER — LORAZEPAM 1 MG PO TABS: 1.0000 mg | ORAL_TABLET | Freq: Three times a day (TID) | ORAL | Status: DC | PRN

## 2015-04-13 MED ORDER — CALCITRIOL 0.25 MCG PO CAPS
0.2500 ug | ORAL_CAPSULE | Freq: Two times a day (BID) | ORAL | Status: DC
  Administered 2015-04-14 – 2015-04-17 (×8): 0.25 ug via ORAL
  Filled 2015-04-13 (×16): qty 1

## 2015-04-13 MED ORDER — IBUPROFEN 600 MG PO TABS
600.0000 mg | ORAL_TABLET | Freq: Three times a day (TID) | ORAL | Status: DC | PRN
  Administered 2015-04-13: 600 mg via ORAL
  Filled 2015-04-13: qty 1

## 2015-04-13 MED ORDER — NICOTINE 21 MG/24HR TD PT24
21.0000 mg | MEDICATED_PATCH | Freq: Every day | TRANSDERMAL | Status: DC
  Filled 2015-04-13 (×2): qty 1

## 2015-04-13 MED ORDER — ONDANSETRON HCL 4 MG PO TABS: 4.0000 mg | ORAL_TABLET | Freq: Three times a day (TID) | ORAL | Status: DC | PRN

## 2015-04-13 MED ORDER — LEVOTHYROXINE SODIUM 125 MCG PO TABS
250.0000 ug | ORAL_TABLET | Freq: Every day | ORAL | Status: DC
  Administered 2015-04-14 – 2015-04-18 (×5): 250 ug via ORAL
  Filled 2015-04-13 (×8): qty 2

## 2015-04-13 NOTE — BH Assessment (Signed)
[facility] Assessment Progress Note  Per Corena Pilgrim, MD, this pt requires psychiatric hospitalization at this time.  Rory Percy, RN, [facility] has assigned pt to [facility] Rm [id number].  Pt presents under IVC initiated by law enforcement and upheld by Dr Darleene Cleaver; these documents have been faxed to [facility].  Pt's nurse, Otho Perl, has been notified, and agrees to call report to [phone].  Pt is to be transported via Event organiser.  Jalene Mullet, [place] Triage Specialist [phone]

## 2015-04-13 NOTE — Tx Team (Signed)
Initial Interdisciplinary Treatment Plan   PATIENT STRESSORS: Legal issue Occupational concerns Traumatic event   PATIENT STRENGTHS: Ability for insight Average or above average intelligence Capable of independent living Communication skills Financial means General fund of knowledge Supportive family/friends   PROBLEM LIST: Problem List/Patient Goals Date to be addressed Date deferred Reason deferred Estimated date of resolution  Depression 04/13/2015  04/13/2015   D/C  Suicide 04/13/2015  04/13/2015   D/C  "I probably don't have a job anymore" 04/13/2015  04/13/2015   D/C  "I'm dealing with legal issues form something I did wrong at work" 04/13/2015  04/13/2015   D/C  "told the officer I wanted to crawl in a hole and die" 04/13/2015  04/13/2015   D/C                           DISCHARGE CRITERIA:  Improved stabilization in mood, thinking, and/or behavior Motivation to continue treatment in a less acute level of care Need for constant or close observation no longer present Reduction of life-threatening or endangering symptoms to within safe limits  PRELIMINARY DISCHARGE PLAN: Outpatient therapy Return to previous living arrangement Return to previous work or school arrangements  PATIENT/FAMIILY INVOLVEMENT: This treatment plan has been presented to and reviewed with the patient, Lee Castillo.  The patient and family have been given the opportunity to ask questions and make suggestions.  Lee Castillo 04/13/2015, 11:15 PM
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
## 2015-04-13 NOTE — Progress Notes (Signed)
Admission Note:  D- 40 yr male who presents IVC in no acute distress for the treatment of SI and Depression. Patient appears flat, depressed, and tearful. Patient was calm and cooperative with admission process. Patient presents with passive SI and contracts for safety upon admission. Patient denies HI and AVH .  Patient reports "I did something wrong at work and have to deal with legal ramifications".   Patient reports after an incident at work he told a friend he wanted to "crawl in a hole and die".  Patient did not go into details.  Patient reports no prior psych hx.  Denies tobacco, alcohol, or drug use.  Past medical Hx of Cancer.  Patient verbalizes feelings of depression, hopelessness, and worthlessness. A- Skin was assessed and found to be clear of any abnormal marks.  Patient searched and no contraband found, POC and unit policies explained and understanding verbalized. Consents obtained.   R- Patient had no additional questions or concerns.

## 2015-04-14 ENCOUNTER — Encounter (HOSPITAL_COMMUNITY): Payer: Self-pay | Admitting: Psychiatry

## 2015-04-14 DIAGNOSIS — F332 Major depressive disorder, recurrent severe without psychotic features: Principal | ICD-10-CM

## 2015-04-14 DIAGNOSIS — R45851 Suicidal ideations: Secondary | ICD-10-CM

## 2015-04-14 MED ORDER — TRAZODONE HCL 50 MG PO TABS
50.0000 mg | ORAL_TABLET | Freq: Every evening | ORAL | Status: DC | PRN
  Administered 2015-04-14: 50 mg via ORAL

## 2015-04-14 MED ORDER — SERTRALINE HCL 50 MG PO TABS
50.0000 mg | ORAL_TABLET | Freq: Every day | ORAL | Status: DC
  Administered 2015-04-15: 50 mg via ORAL
  Filled 2015-04-14 (×3): qty 1

## 2015-04-14 NOTE — Progress Notes (Addendum)
`  Lee Castillo is seen [facility] on the [facility] hall today..he tolerates this well. HE is pleasant ., cooperative and he completes his daily assessment and on it he wrote he  Denied SI today and he rated his depression, hopelessness and anxiety " 7/7/5", respectively. A He says he didn't sleep last night. Ambien is discontinued and he is scheduled to receive trazadone 100 mg po at HS. R Safety is in place  Per Dr. Parke Poisson" s request pt , signed into treatment voluntarily. POC cont
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
## 2015-04-14 NOTE — Progress Notes (Signed)
Patient ID: Lee Castillo, male   DOB: 08/07/1975, 40 y.o.   MRN: [id number] D: Client reports depression "7" notes "supportive family" "my father and sister came yesterday" "my sister is a nurse" "the Doristine Bosworth came by too" A: Writer reviewed medication, administered as ordered. Client encouraged to report any concerns.  Staff will monitor q54min for safety.

## 2015-04-14 NOTE — B[facility] Group Notes (Signed)
[facility] [name] Aftercare Discharge Planning Group Note  04/14/2015 8:45 AM  Participation Quality: Alert, Appropriate and Oriented  Mood/Affect: Appropriate  Depression Rating: 6  Anxiety Rating: 6  Thoughts of Suicide: Pt denies SI/HI  Will you contract for safety? Yes  Current AVH: Pt denies  Plan for Discharge/Comments: Pt attended discharge planning group and actively participated in group. CSW discussed suicide prevention education with the group and encouraged them to discuss discharge planning and any relevant barriers. Pt reports some depression; forwarded minimal information  Transportation Means: Pt reports access to transportation  Supports: No supports mentioned at this time  Peri Maris, [place] 04/14/2015 10:23 AM
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
## 2015-04-14 NOTE — Tx Team (Signed)
Interdisciplinary Treatment Plan Update (Adult) Date: 04/14/2015   Date: 04/14/2015 4:48 PM  Progress in Treatment:  Attending groups: Yes  Participating in groups: No Taking medication as prescribed: Yes  Tolerating medication: Yes  Family/Significant othe contact made: No, CSW attempting to make contact with father Patient understands diagnosis: Yes AEB seeking help Discussing patient identified problems/goals with staff: Yes  Medical problems stabilized or resolved: Yes  Denies suicidal/homicidal ideation: Yes Patient has not harmed self or Others: Yes   New problem(s) identified: None identified at this time.   Discharge Plan or Barriers: Pt will return home and follow-up with outpatient providers  Additional comments:  Patient and CSW reviewed pt's identified goals and treatment plan. Patient verbalized understanding and agreed to treatment plan. CSW reviewed [facility] "Discharge Process and Patient Involvement" Form. Pt verbalized understanding of information provided and signed form.   Reason for Continuation of Hospitalization:  Depression Medication stabilization Suicidal ideation   Estimated length of stay: 3-5 days  Review of initial/current patient goals per problem list:   1.  Goal(s): Patient will participate in aftercare plan  Met:  Yes  Target date: 3-5 days from date of admission   As evidenced by: Patient will participate within aftercare plan AEB aftercare provider and housing plan at discharge being identified.   04/14/15:  Pt will return home and follow-up with outpatient providers  2.  Goal (s): Patient will exhibit decreased depressive symptoms and suicidal ideations.  Met:  Progressing  Target date: 3-5 days from date of admission   As evidenced by: Patient will utilize self rating of depression at 3 or below and demonstrate decreased signs of depression or be deemed stable for discharge by MD.  04/14/15: Pt rates depression at 6/10; denies  SI  Attendees:  Patient:    Family:    Physician: Dr. Parke Poisson, MD  04/14/2015 4:48 PM  Nursing: Lars Pinks, RN Case manager  04/14/2015 4:48 PM  Clinical Social Worker Peri Maris, [place] 04/14/2015 4:48 PM  Other: Tilden Fossa, [place] 04/14/2015 4:48 PM  Clinical: Vira Browns, RN; Marcella Dubs, RN 04/14/2015 4:48 PM  Other: , RN Charge Nurse 04/14/2015 4:48 PM  Other: Hilda Lias, [place], [place] Work [phone]
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
## 2015-04-14 NOTE — BHH Group Notes (Signed)
[place] [name] Group Therapy 04/14/2015 1:15pm  Type of Therapy: Group Therapy- Feelings Around Relapse and Recovery  Participation Level: Reserved  Participation Quality:  Attentive  Affect:  Appropriate  Cognitive: Alert and Oriented   Insight:  Developing   Engagement in Therapy: Developing/Improving and Engaged   Modes of Intervention: Clarification, Confrontation, Discussion, Education, Exploration, Limit-setting, Orientation, Problem-solving, Rapport Building, Art therapist, Socialization and Support  Summary of Progress/Problems: The topic for today was feelings about relapse. The group discussed what relapse prevention is to them and identified triggers that they are on the path to relapse. Members also processed their feeling towards relapse and were able to relate to common experiences. Group also discussed coping skills that can be used for relapse prevention.  Pt did not participate in group discussion but was observed to be attentive and taking notes.    Therapeutic Modalities:   Cognitive Behavioral Therapy Solution-Focused Therapy Assertiveness Training Relapse Prevention Therapy    Norman Clay [id number] 04/14/2015 4:59 PM

## 2015-04-14 NOTE — Progress Notes (Signed)
Adult Psychoeducational Group Note  Date:  04/14/2015 Time:  9:12 PM  Group Topic/Focus:  Wrap-Up Group:   The focus of this group is to help patients review their daily goal of treatment and discuss progress on daily workbooks.  Participation Level:  Active  Participation Quality:  Appropriate  Affect:  Appropriate  Cognitive:  Alert  Insight: Appropriate  Engagement in Group:  Engaged  Modes of Intervention:  Discussion  Additional Comments:  Patient goal for today was to feel better about himself and his situation. Patient stated he was talking baby steps towards reaching his goal. On a scale between 1-10, (1=worse, 10=best) patient rated his day a 6.  Laurielle Selmon L Haeden Hudock 04/14/2015, 9:12 PM

## 2015-04-14 NOTE — Progress Notes (Signed)
Recreation Therapy Notes  Date: 03.31.2017 Time: 9:30am Location: [address] Group Room   Group Topic: Stress Management  Goal Area(s) Addresses:  Patient will actively participate in stress management techniques presented during session.   Behavioral Response: Did not attend.   Laureen Ochs Lucian Baswell, LRT/CTRS        Dayshawn Irizarry L 04/14/2015 2:07 PM

## 2015-04-14 NOTE — Progress Notes (Addendum)
Patient ID: Lee Castillo, male   DOB: 1975/01/24, 41 y.o.   MRN: [id number] D: Client pleasant, makes eye contact, reports depression, talks about having had multiple surgeries for stomach cancer. Client reports "nothing helps me sleep"  A: Writer introduced self to client, encouraged sleep medications. Reviewed medications and administered as ordered. Staff will monitor q85min for safety. R: Client is safe on the unit. Client refused trazodone "it won't work"

## 2015-04-15 DIAGNOSIS — F329 Major depressive disorder, single episode, unspecified: Secondary | ICD-10-CM

## 2015-04-15 DIAGNOSIS — R45851 Suicidal ideations: Secondary | ICD-10-CM

## 2015-04-15 LAB — TSH: TSH: 2.311 u[IU]/mL (ref 0.350–4.500)

## 2015-04-15 MED ORDER — SERTRALINE HCL 100 MG PO TABS
100.0000 mg | ORAL_TABLET | Freq: Every day | ORAL | Status: DC
  Administered 2015-04-16 – 2015-04-18 (×3): 100 mg via ORAL
  Filled 2015-04-15 (×5): qty 1

## 2015-04-15 MED ORDER — TRAZODONE HCL 100 MG PO TABS
100.0000 mg | ORAL_TABLET | Freq: Every evening | ORAL | Status: DC | PRN
  Administered 2015-04-15 – 2015-04-16 (×2): 100 mg via ORAL
  Filled 2015-04-15: qty 1

## 2015-04-15 MED ORDER — ARIPIPRAZOLE 2 MG PO TABS
2.0000 mg | ORAL_TABLET | Freq: Every day | ORAL | Status: DC
  Filled 2015-04-15: qty 1

## 2015-04-15 NOTE — BHH Group Notes (Signed)
[place] Group Notes:  (Nursing/MHT/Case Management/Adjunct)  Date:  04/15/2015  Time:  1315  Type of Therapy:  Nurse Education  Life Skills :  The group focuses on teaching patients how to identify their needs and then develop skills needed to get their needs met.  Participation Level:  Active  Participation Quality:  Appropriate  Affect:  Appropriate  Cognitive:  Alert  Insight:  Appropriate  Engagement in Group:  Engaged  Modes of Intervention:  Education  Summary of Progress/Problems:  Lauralyn Primes 04/15/2015, 2:27 PM

## 2015-04-15 NOTE — Progress Notes (Signed)
Patient observed sitting in the dayroom watching tv with minimal interaction with peers. Writer spoke with him 1:1 and he reported that his day has been okay and became tearful when he spoke of his discharge and the uncertainty of what may happen. He didn't elaborate on what had him sad but made the comment that he should have known better. Support given and encouraged him to try and remain and think positive of his outcome. He was receptive. Safety maintained on unit with 15 min checks.

## 2015-04-15 NOTE — Progress Notes (Signed)
Lee Castillo is seen OOB UAL on the unit today. He is much less nervous, he is seen sitting in the dayroom watching TV . He makes brief eye contact with this Probation officer, he takes his scheduled meds as planned and he attends his groups. A  He completed his daily assessment this morning and on it he wrote he denied SI today and he rated his depression, hopelessness and anxiety " 6/6/2" respectively. R Safety in place.
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
## 2015-04-15 NOTE — BHH Group Notes (Signed)
[place] [name] Group Therapy  04/15/2015 / 10 AM  Type of Therapy:  Group Therapy  Participation Level:  Active  Participation Quality:  Appropriate  Affect:  Anxious and Depressed  Cognitive:  Alert and Oriented  Insight:  Developing  Engagement in Therapy:  Developing  Modes of Intervention:  Discussion, Exploration, Rapport Building, Socialization and Support  Summary of Progress/Problems:   Summary of Progress/Problems: The main focus of today's process group was for the patient to identify ways in which they have in the past sabotaged their own recovery. Motivational Interviewing was utilized to ask the group members what they get out of their self sabotaging behavior(s), and what reasons they may have for wanting to change. The Stages of Change were explained using a handout, and patients identified where they currently are with regard to stages of change. Patient related well to group warm up focus on humor and shared that he often times uses humor as a coping tool. Patient was able to process that he sometimes goes to extreme with sarcasm directed at self. Pt reported he is overwhelmed with current circumstances in his life.    Sheilah Pigeon, [name]

## 2015-04-16 MED ORDER — NORTRIPTYLINE HCL 25 MG PO CAPS: 25.0000 mg | ORAL_CAPSULE | Freq: Every day | ORAL | Status: DC

## 2015-04-16 NOTE — Plan of Care (Signed)
Problem: Ineffective individual coping Goal: STG: Patient will remain free from self harm Outcome: Progressing Patient has remained free from self harm and safety maintained on unit with 15 min checks.

## 2015-04-16 NOTE — BHH Group Notes (Signed)
[place] [name] Group Therapy  04/16/2015   10:00 AM   Type of Therapy:  Group Therapy  Participation Level:  Active  Participation Quality:  Appropriate and Attentive  Affect:  Flat and Depressed  Cognitive:  Alert and Appropriate  Insight:  Developing/Improving and Engaged  Engagement in Therapy:  Developing/Improving and Engaged  Modes of Intervention:  Clarification, Confrontation, Discussion, Education, Exploration, Limit-setting, Orientation, Problem-solving, Rapport Building, Art therapist, Socialization and Support  Summary of Progress/Problems: The main focus of today's process group was to identify the patient's current support system and decide on other supports that can be put in place.  An emphasis was placed on using counselor, doctor, therapy groups, 12-step groups, and problem-specific support groups to expand supports, as well as doing something different than has been done before. Pt shared that he has always been the strong, go to guy and has always had to put on a mask that he was well and everything was okay. Pt states that he finally became overwhelmed with keeping everything together but now feels guilty for letting everyone down by being here.  Pt was able to relate to a peer about this guilt and able to process his feelings around this.  Pt actively participated and was engaged in group discussion.    Theressa Millard, [place] 04/16/2015 11:36 AM

## 2015-04-16 NOTE — Progress Notes (Signed)
Adult Psychoeducational Group Note  Date:  04/16/2015 Time:  4:28 PM  Group Topic/Focus:  Healthy Support Systems :   The group focuses on teaching patients how to develop and use healthy support systems.   Participation Level:Pt attended.    Participation Quality:  Alert  Affect: Flat    Cognitive:  Intact  Insight: fair  Engagement in Group:  Engaged  Modes of Intervention:  Education Additional Comments:    Lauralyn Primes 04/16/2015, 4:28 PM

## 2015-04-16 NOTE — Progress Notes (Signed)
Adult Psychoeducational Group Note  Date:  04/16/2015 Time:  8:20 PM  Group Topic/Focus:  Wrap-Up Group:   The focus of this group is to help patients review their daily goal of treatment and discuss progress on daily workbooks.  Participation Level:  Active  Participation Quality:  Appropriate  Affect:  Appropriate  Cognitive:  Appropriate  Insight: Good  Engagement in Group:  Engaged  Modes of Intervention:  Discussion  Additional Comments:  Pt was pleasant. Pt rated his overall day a 7 out of 10 because he enjoyed the positive communication and socialization with other patients in the milieu throughout the day, as well as some staff members of [facility]. Pt reported that he achieved his goal for the day, which was to work on his plans for after he is discharged from [facility].   Lincoln Brigham 04/16/2015, 9:03 PM

## 2015-04-16 NOTE — Plan of Care (Signed)
Problem: Diagnosis: Increased Risk For Suicide Attempt Goal: STG-Patient Will Report Suicidal Feelings to Staff Outcome: Progressing Patient attended evening group and participated.

## 2015-04-16 NOTE — Progress Notes (Signed)
Writer has observed patient up in the dayroom most of the evening watching tv and minimal interaction with peers. He had his father,sister and 2 other relatives to visit him tonight. He spoke about being glad to have a supportive family. He has still voiced concern to the writer about what he has to face once he discharges. Writer encourages him to continue to remain positive and make use of his support system. He currently denies si/hi/a/v hallucinations. Safety maintained on unit with 15 min checks.
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
## 2015-04-16 NOTE — Progress Notes (Signed)
Lee Castillo is seen OOB UAL in the milieu...he attends his groups. He speaks with this nurse several times today./...about how he is accepting that he's had problems ' for a long time now..I just didn't want to admit it...', ' I need to arrange some follow up care when I get out of here...how do I do that?" , " I've been thinking a lot about my problems and I haven't realized until just now....Marland Kitchenhow bad things really are..." A He completed his daily assessment and on it he wrote he denied SI today and he rated his depression, hopelessness and anxiety  6/5/2", respectively. R Pos reinforcement is offered and he is encouraged to cont with counseling and being honest to slef with his feelings.

## 2015-04-17 MED ORDER — CALCITRIOL 0.25 MCG PO CAPS
0.2500 ug | ORAL_CAPSULE | Freq: Two times a day (BID) | ORAL | Status: DC
  Administered 2015-04-17 – 2015-04-18 (×2): 0.25 ug via ORAL
  Filled 2015-04-17 (×6): qty 1

## 2015-04-17 NOTE — BHH Suicide Risk Assessment (Signed)
[place] INPATIENT:  Family/Significant Other Suicide Prevention Education  Suicide Prevention Education:  Education Completed; Tammy Polein, Pt's sister [phone], has been identified by the patient as the family member/significant other with whom the patient will be residing, and identified as the person(s) who will aid the patient in the event of a mental health crisis (suicidal ideations/suicide attempt).  With written consent from the patient, the family member/significant other has been provided the following suicide prevention education, prior to the and/or following the discharge of the patient.  The suicide prevention education provided includes the following:  Suicide risk factors  Suicide prevention and interventions  National Suicide Hotline telephone number  [facility] assessment telephone number  [facility] Emergency Assistance [place] and/or Residential Mobile Crisis Unit telephone number  Request made of family/significant other to:  Remove weapons (e.g., guns, rifles, knives), all items previously/currently identified as safety concern.    Remove drugs/medications (over-the-counter, prescriptions, illicit drugs), all items previously/currently identified as a safety concern.  The family member/significant other verbalizes understanding of the suicide prevention education information provided.  The family member/significant other agrees to remove the items of safety concern listed above.  Lee Castillo 04/17/2015, 11:16 AM
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
## 2015-04-17 NOTE — BHH Group Notes (Signed)
[facility] [name] Aftercare Discharge Planning Group Note  04/17/2015 8:45 AM  Participation Quality: Alert, Appropriate and Oriented  Mood/Affect: Flat  Depression Rating: 6  Anxiety Rating: 1-2  Thoughts of Suicide: Pt denies SI/HI  Will you contract for safety? Yes  Current AVH: Pt denies  Plan for Discharge/Comments: Pt attended discharge planning group and actively participated in group. CSW discussed suicide prevention education with the group and encouraged them to discuss discharge planning and any relevant barriers. Pt reports that depression has not improved at this point. Pt continues to be minimal in interaction.   Transportation Means: Pt reports access to transportation  Supports: No supports mentioned at this time  Peri Maris, [place] 04/17/2015 11:16 AM

## 2015-04-17 NOTE — Progress Notes (Signed)
Recreation Therapy Notes  Date: 04.03.2017 Time: 9:30am Location: [address] Dayroom   Group Topic: Stress Management  Goal Area(s) Addresses:  Patient will actively participate in stress management techniques presented during session.   Behavioral Response: Did not attend.   Laureen Ochs Taim Wurm, LRT/CTRS        Valari Taylor L 04/17/2015 3:08 PM

## 2015-04-17 NOTE — Progress Notes (Signed)
Adult Psychoeducational Group Note  Date:  04/17/2015 Time:  8:20 PM  Group Topic/Focus:  Wrap-Up Group:   The focus of this group is to help patients review their daily goal of treatment and discuss progress on daily workbooks.  Participation Level:  Active  Participation Quality:  Appropriate  Affect:  Appropriate  Cognitive:  Appropriate  Insight: Appropriate  Engagement in Group:  Engaged  Modes of Intervention:  Discussion  Additional Comments:  Pt was pleasant. Pt rated his overall day a 7 out of 10 and stated that he had an "okay day" today. Pt reported that he achieved his goal for the day, which was to work on his discharge plans.   Lincoln Brigham 04/17/2015, 9:05 PM

## 2015-04-17 NOTE — BHH Group Notes (Signed)
[facility] [name] Group Therapy  04/17/2015 1:15pm  Type of Therapy:  Group Therapy vercoming Obstacles  Participation Level:  Active  Participation Quality:  Appropriate   Affect:  Appropriate  Cognitive:  Appropriate and Oriented  Insight:  Developing/Improving and Improving  Engagement in Therapy:  Improving  Modes of Intervention:  Discussion, Exploration, Problem-solving and Support  Description of Group:   In this group patients will be encouraged to explore what they see as obstacles to their own wellness and recovery. They will be guided to discuss their thoughts, feelings, and behaviors related to these obstacles. The group will process together ways to cope with barriers, with attention given to specific choices patients can make. Each patient will be challenged to identify changes they are motivated to make in order to overcome their obstacles. This group will be process-oriented, with patients participating in exploration of their own experiences as well as giving and receiving support and challenge from other group members.  Summary of Patient Progress: Pt identified "pushing things down" as his primary obstacle. Pt continues to be vague in identifying his primary stressors but expresses a desire to be more aware of his warning signs and asking for help.    Therapeutic Modalities:   Cognitive Behavioral Therapy Solution Focused Therapy Motivational Interviewing Relapse Prevention Therapy   Peri Maris, LCSWA 04/17/2015 3:01 PM

## 2015-04-17 NOTE — Progress Notes (Signed)
D:Pt reports that he takes Calcitriol four hours after synthroid. Gave medication late and reported to NP for medication schedule change. Pt rates depression as a 6 and anxiety as a 2 on 0-10 scale with 10 being the most.  A:Offered support, encouragement and 15 minute checks. Offered prn pain medication and pt refused for rt wrist pain with tendonitis. R:Pt denies si and safety maintained on the unit.

## 2015-04-17 NOTE — Progress Notes (Signed)
Lee Castillo states his day was "productive". He has been working on his Artist which may include [facility] or OP therapy through [facility]. He rates Anxiety 2-3/10 and Depression 5-6/10. Denies SI/HI/AVH. Encouragement and support given. Medications administered as prescribed. Continue Q 15 minute checks for patient safety and medication effectiveness.

## 2015-04-18 MED ORDER — CALCITRIOL 0.25 MCG PO CAPS: 0.2500 ug | ORAL_CAPSULE | Freq: Two times a day (BID) | ORAL | Status: DC

## 2015-04-18 MED ORDER — SERTRALINE HCL 100 MG PO TABS: 100.0000 mg | ORAL_TABLET | Freq: Every day | ORAL | Status: DC

## 2015-04-18 MED ORDER — LORAZEPAM 1 MG PO TABS: 1.0000 mg | ORAL_TABLET | Freq: Three times a day (TID) | ORAL | Status: DC | PRN

## 2015-04-18 MED ORDER — LEVOTHYROXINE SODIUM 125 MCG PO TABS: 250.0000 ug | ORAL_TABLET | Freq: Every day | ORAL | Status: DC

## 2015-04-18 MED ORDER — IBUPROFEN 200 MG PO TABS: 400.0000 mg | ORAL_TABLET | Freq: Four times a day (QID) | ORAL | Status: DC | PRN

## 2015-04-18 MED ORDER — TRAZODONE HCL 100 MG PO TABS: 100.0000 mg | ORAL_TABLET | Freq: Every evening | ORAL | Status: DC | PRN

## 2015-04-18 NOTE — Progress Notes (Signed)
  [facility] Adult Case Management Discharge Plan :  Will you be returning to the same living situation after discharge:  Yes,  Pt returning home At discharge, do you have transportation home?: Yes,  Pt sister to pick up Do you have the ability to pay for your medications: Yes,  pt provided with prescriptions  Release of information consent forms completed and in the chart;  Patient's signature needed at discharge.  Patient to Follow up at: Follow-up Information    Follow up with [facility].   Specialty:  Behavioral Health   Why:  Tentative therapy scheduled for 4/21. When you call to confirm your appointment with a deposit, please ask to have medication management appt scheduled within 30 days of discharge.    Contact information:   [place] [address] [phone]       Follow up with [facility] On 04/20/2015.   Specialty:  Behavioral Health   Why:  at 8:45am with Velva Harman for your initial assessment for the Intensive Outpatient Program.    Contact information:   [address] [phone] [place] [phone]      Next level of care provider has access to [place] and Suicide Prevention discussed: Yes,  with sister; see SPE note for further details  Have you used any form of tobacco in the last 30 days? (Cigarettes, Smokeless Tobacco, Cigars, and/or Pipes): No  Has patient been referred to the Quitline?: N/A patient is not a smoker  Patient has been referred for addiction treatment: N/A  Bo Mcclintock 04/18/2015, 1:14 PM
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
## 2015-04-18 NOTE — Tx Team (Signed)
Interdisciplinary Treatment Plan Update (Adult) Date: 04/18/2015   Date: 04/18/2015 1:13 PM  Progress in Treatment:  Attending groups: Yes  Participating in groups: Yes Taking medication as prescribed: Yes  Tolerating medication: Yes  Family/Significant othe contact made: Yes with sister Patient understands diagnosis: Yes AEB seeking help Discussing patient identified problems/goals with staff: Yes  Medical problems stabilized or resolved: Yes  Denies suicidal/homicidal ideation: Yes Patient has not harmed self or Others: Yes   New problem(s) identified: None identified at this time.   Discharge Plan or Barriers: Pt will return home and follow-up with outpatient providers  Additional comments:  Patient and CSW reviewed pt's identified goals and treatment plan. Patient verbalized understanding and agreed to treatment plan. CSW reviewed [facility] "Discharge Process and Patient Involvement" Form. Pt verbalized understanding of information provided and signed form.   Reason for Continuation of Hospitalization:  Depression Medication stabilization Suicidal ideation   Estimated length of stay: 0 days  Review of initial/current patient goals per problem list:   1.  Goal(s): Patient will participate in aftercare plan  Met:  Yes  Target date: 3-5 days from date of admission   As evidenced by: Patient will participate within aftercare plan AEB aftercare provider and housing plan at discharge being identified.   04/14/15:  Pt will return home and follow-up with outpatient providers  2.  Goal (s): Patient will exhibit decreased depressive symptoms and suicidal ideations.  Met:  Adequate for DC  Target date: 3-5 days from date of admission   As evidenced by: Patient will utilize self rating of depression at 3 or below and demonstrate decreased signs of depression or be deemed stable for discharge by MD.  04/14/15: Pt rates depression at 6/10; denies SI  04/18/15: MD feels that Pt's symptoms  have decreased to the point that they can be managed in an outpatient setting.   Attendees:  Patient:    Family:    Physician: Dr. Parke Poisson, MD  04/18/2015 1:13 PM  Nursing: Lars Pinks, RN Case manager  04/18/2015 1:13 PM  Clinical Social Worker Peri Maris, [place] 04/18/2015 1:13 PM  Other: Tilden Fossa, LCSWA 04/18/2015 1:13 PM  Clinical: Legrand Como, RN; Patrecia Pace, RN 04/18/2015 1:13 PM  Other: , RN Charge Nurse 04/18/2015 1:13 PM  Other: Hilda Lias, [place], [place] Social Work [phone]

## 2015-04-18 NOTE — Progress Notes (Signed)
Patient ID: Lee Castillo, male   DOB: 1975-10-01, 40 y.o.   MRN: [id number]  Pt. Denies SI/HI and A/V hallucinations. Belongings returned to patient at time of discharge. Patient denies any pain or discomfort. Discharge instructions and medications were reviewed with patient. Patient verbalized understanding of both medications and discharge instructions. Patient discharged to lobby where his ride was there to pick him up. No distress noted on discharge. Q15 minute safety checks maintained until discharge.
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
## 2015-04-18 NOTE — Progress Notes (Signed)
Recreation Therapy Notes  Animal-Assisted Activity (AAA) Program Checklist/Progress Notes Patient Eligibility Criteria Checklist & Daily Group note for Rec Tx Intervention  Date: 04.04.2017 Time: 2:45pm Location: [address]    AAA/T Program Assumption of Risk Form signed by Patient/ or Parent Legal Guardian Yes  Patient is free of allergies or sever asthma Yes  Patient reports no fear of animals Yes  Patient reports no history of cruelty to animals Yes  Patient understands his/her participation is voluntary Yes  Patient washes hands before animal contact Yes  Patient washes hands after animal contact Yes  Behavioral Response: Appropriate   Education: Hand Washing, Appropriate Animal Interaction   Education Outcome: Acknowledges education.   Clinical Observations/Feedback: Patient interacted appropriately with therapy dog and peers during session.    Laureen Ochs Dalton [place]ille, LRT/CTRS        Taquanna Borras L 04/18/2015 3:11 PM

## 2015-05-10 ENCOUNTER — Telehealth: Payer: Self-pay | Admitting: Sports Medicine

## 2015-05-10 NOTE — Telephone Encounter (Signed)
I received notification several weeks ago from a local company requesting a lymphedema pump for Lee Castillo's upper extremity. 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 I received correspondence from one of our local physical therapists who tells me that Lee Castillo did in fact try the pump and found it to be of great benefit. She thinks that it will benefit him greatly. Therefore, I will complete the appropriate paperwork to get this approved for [facility].

## 2015-06-05 ENCOUNTER — Telehealth: Payer: Self-pay | Admitting: Sports Medicine

## 2015-06-05 ENCOUNTER — Other Ambulatory Visit: Payer: Self-pay | Admitting: *Deleted

## 2015-06-05 MED ORDER — SERTRALINE HCL 100 MG PO TABS: 100.0000 mg | ORAL_TABLET | Freq: Every day | ORAL | Status: DC

## 2015-06-05 NOTE — Telephone Encounter (Signed)
I received a telephone call from the patient over the weekend requesting a refill on his Zoloft. Please see his chart for details regarding his recent psychiatric hospitalization admission. He has been seeing a counselor but tells me that they are unable to prescribe medications. He feels like the Zoloft has been helping him and he has been out of it for about a month and feels himself slipping back into depression. I have agreed to refill his medication for the next 2 months but I have also informed him that future refills will need to come from his psychiatrist.

## 2016-06-19 ENCOUNTER — Encounter (HOSPITAL_COMMUNITY): Payer: Self-pay

## 2016-06-19 ENCOUNTER — Emergency Department (HOSPITAL_COMMUNITY): Payer: Self-pay

## 2016-06-19 ENCOUNTER — Inpatient Hospital Stay (HOSPITAL_COMMUNITY)
Admission: EM | Admit: 2016-06-19 | Discharge: 2016-06-21 | DRG: 394 | Disposition: A | Discharge status: Other Acute Inpatient Hospital | Payer: Self-pay | Attending: General Surgery | Admitting: General Surgery

## 2016-06-19 DIAGNOSIS — D72829 Elevated white blood cell count, unspecified: Secondary | ICD-10-CM | POA: Diagnosis present

## 2016-06-19 DIAGNOSIS — F339 Major depressive disorder, recurrent, unspecified: Secondary | ICD-10-CM | POA: Diagnosis present

## 2016-06-19 DIAGNOSIS — Z85820 Personal history of malignant melanoma of skin: Secondary | ICD-10-CM

## 2016-06-19 DIAGNOSIS — M459 Ankylosing spondylitis of unspecified sites in spine: Secondary | ICD-10-CM | POA: Diagnosis present

## 2016-06-19 DIAGNOSIS — Z888 Allergy status to other drugs, medicaments and biological substances status: Secondary | ICD-10-CM

## 2016-06-19 DIAGNOSIS — K56609 Unspecified intestinal obstruction, unspecified as to partial versus complete obstruction: Secondary | ICD-10-CM | POA: Diagnosis present

## 2016-06-19 DIAGNOSIS — E872 Acidosis, unspecified: Secondary | ICD-10-CM

## 2016-06-19 DIAGNOSIS — K436 Other and unspecified ventral hernia with obstruction, without gangrene: Principal | ICD-10-CM | POA: Diagnosis present

## 2016-06-19 DIAGNOSIS — F419 Anxiety disorder, unspecified: Secondary | ICD-10-CM | POA: Diagnosis present

## 2016-06-19 DIAGNOSIS — E890 Postprocedural hypothyroidism: Secondary | ICD-10-CM | POA: Diagnosis present

## 2016-06-19 DIAGNOSIS — K760 Fatty (change of) liver, not elsewhere classified: Secondary | ICD-10-CM | POA: Diagnosis present

## 2016-06-19 DIAGNOSIS — R109 Unspecified abdominal pain: Secondary | ICD-10-CM | POA: Diagnosis present

## 2016-06-19 DIAGNOSIS — Z79899 Other long term (current) drug therapy: Secondary | ICD-10-CM

## 2016-06-19 DIAGNOSIS — Z85850 Personal history of malignant neoplasm of thyroid: Secondary | ICD-10-CM

## 2016-06-19 DIAGNOSIS — Z91018 Allergy to other foods: Secondary | ICD-10-CM

## 2016-06-19 DIAGNOSIS — K5730 Diverticulosis of large intestine without perforation or abscess without bleeding: Secondary | ICD-10-CM | POA: Diagnosis present

## 2016-06-19 LAB — I-STAT CHEM 8, ED
BUN: 21 mg/dL — AB (ref 6–20)
CREATININE: 1.8 mg/dL — AB (ref 0.61–1.24)
Calcium, Ion: 0.82 mmol/L — CL (ref 1.15–1.40)
Chloride: 106 mmol/L (ref 101–111)
GLUCOSE: 228 mg/dL — AB (ref 65–99)
HCT: 53 % — ABNORMAL HIGH (ref 39.0–52.0)
Hemoglobin: 18 g/dL — ABNORMAL HIGH (ref 13.0–17.0)
Potassium: 3.8 mmol/L (ref 3.5–5.1)
Sodium: 139 mmol/L (ref 135–145)
TCO2: 17 mmol/L (ref 0–100)

## 2016-06-19 LAB — CBC
HEMATOCRIT: 55.4 % — AB (ref 39.0–52.0)
HEMOGLOBIN: 18.6 g/dL — AB (ref 13.0–17.0)
MCH: 28.7 pg (ref 26.0–34.0)
MCHC: 33.6 g/dL (ref 30.0–36.0)
MCV: 85.6 fL (ref 78.0–100.0)
Platelets: 309 10*3/uL (ref 150–400)
RBC: 6.47 MIL/uL — AB (ref 4.22–5.81)
RDW: 16 % — ABNORMAL HIGH (ref 11.5–15.5)
WBC: 24.9 10*3/uL — AB (ref 4.0–10.5)

## 2016-06-19 LAB — ACETAMINOPHEN LEVEL: Acetaminophen (Tylenol), Serum: <10 ug/mL — ABNORMAL LOW (ref 10–30)

## 2016-06-19 LAB — BASIC METABOLIC PANEL
Anion gap: 25 — ABNORMAL HIGH (ref 5–15)
BUN: 17 mg/dL (ref 6–20)
CO2: 13 mmol/L — AB (ref 22–32)
Calcium: 9.2 mg/dL (ref 8.9–10.3)
Chloride: 100 mmol/L — ABNORMAL LOW (ref 101–111)
Creatinine, Ser: 2.01 mg/dL — ABNORMAL HIGH (ref 0.61–1.24)
GFR calc non Af Amer: 39 mL/min — ABNORMAL LOW (ref >60)
GFR, EST AFRICAN AMERICAN: 46 mL/min — AB (ref >60)
Glucose, Bld: 222 mg/dL — ABNORMAL HIGH (ref 65–99)
POTASSIUM: 4 mmol/L (ref 3.5–5.1)
SODIUM: 138 mmol/L (ref 135–145)

## 2016-06-19 LAB — HEPATIC FUNCTION PANEL
ALBUMIN: 5.2 g/dL — AB (ref 3.5–5.0)
ALT: 41 U/L (ref 17–63)
AST: 48 U/L — ABNORMAL HIGH (ref 15–41)
Alkaline Phosphatase: 71 U/L (ref 38–126)
Bilirubin, Direct: 0.1 mg/dL (ref 0.1–0.5)
Indirect Bilirubin: 1.3 mg/dL — ABNORMAL HIGH (ref 0.3–0.9)
TOTAL PROTEIN: 8.7 g/dL — AB (ref 6.5–8.1)
Total Bilirubin: 1.4 mg/dL — ABNORMAL HIGH (ref 0.3–1.2)

## 2016-06-19 LAB — I-STAT TROPONIN, ED
Troponin i, poc: 0 ng/mL (ref 0.00–0.08)
Troponin i, poc: 0 ng/mL (ref 0.00–0.08)

## 2016-06-19 LAB — I-STAT VENOUS BLOOD GAS, ED
ACID-BASE DEFICIT: 2 mmol/L (ref 0.0–2.0)
BICARBONATE: 18.4 mmol/L — AB (ref 20.0–28.0)
O2 SAT: 80 %
PO2 VEN: 39 mmHg (ref 32.0–45.0)
TCO2: 19 mmol/L (ref 0–100)
pCO2, Ven: 23.9 mmHg — ABNORMAL LOW (ref 44.0–60.0)
pH, Ven: 7.494 — ABNORMAL HIGH (ref 7.250–7.430)

## 2016-06-19 LAB — I-STAT CG4 LACTIC ACID, ED
LACTIC ACID, VENOUS: 9.74 mmol/L — AB (ref 0.5–1.9)
Lactic Acid, Venous: 6.56 mmol/L (ref 0.5–1.9)

## 2016-06-19 LAB — LIPASE, BLOOD: LIPASE: 28 U/L (ref 11–51)

## 2016-06-19 MED ORDER — HYDROMORPHONE HCL 1 MG/ML IJ SOLN
1.0000 mg | Freq: Once | INTRAMUSCULAR | Status: AC
  Administered 2016-06-19: 1 mg via INTRAVENOUS
  Filled 2016-06-19: qty 1

## 2016-06-19 MED ORDER — SODIUM CHLORIDE 0.9 % IV BOLUS (SEPSIS)
1000.0000 mL | Freq: Once | INTRAVENOUS | Status: AC
  Administered 2016-06-19: 1000 mL via INTRAVENOUS

## 2016-06-19 MED ORDER — ONDANSETRON HCL 4 MG/2ML IJ SOLN
4.0000 mg | Freq: Once | INTRAMUSCULAR | Status: AC
  Administered 2016-06-19: 4 mg via INTRAVENOUS

## 2016-06-19 MED ORDER — SODIUM CHLORIDE 0.9 % IV BOLUS (SEPSIS)
500.0000 mL | Freq: Once | INTRAVENOUS | Status: AC
  Administered 2016-06-20: 500 mL via INTRAVENOUS

## 2016-06-19 MED ORDER — ONDANSETRON HCL 4 MG/2ML IJ SOLN
4.0000 mg | Freq: Once | INTRAMUSCULAR | Status: DC
  Filled 2016-06-19: qty 2

## 2016-06-19 MED ORDER — SODIUM CHLORIDE 0.9 % IV BOLUS (SEPSIS)
1000.0000 mL | Freq: Once | INTRAVENOUS | Status: AC
  Administered 2016-06-20: 1000 mL via INTRAVENOUS

## 2016-06-19 MED ORDER — VANCOMYCIN HCL IN DEXTROSE 1-5 GM/200ML-% IV SOLN
1000.0000 mg | Freq: Once | INTRAVENOUS | Status: AC
  Administered 2016-06-19: 1000 mg via INTRAVENOUS
  Filled 2016-06-19: qty 200

## 2016-06-19 MED ORDER — PIPERACILLIN-TAZOBACTAM 3.375 G IVPB 30 MIN
3.3750 g | Freq: Once | INTRAVENOUS | Status: AC
  Administered 2016-06-19: 3.375 g via INTRAVENOUS
  Filled 2016-06-19 (×2): qty 50
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
## 2016-06-19 NOTE — ED Triage Notes (Signed)
Pt endorses abd pain that began last night, today began to have generalized cramping and while sitting in the lobby waiting to be triage pt began having chest pain and shob. Pt tachycpneic and diaphoretic. Tachy in the 140s. Has hx of diverticulitis but states "it's never been this bad"

## 2016-06-19 NOTE — ED Notes (Signed)
ED Provider at bedside. 

## 2016-06-20 ENCOUNTER — Inpatient Hospital Stay (HOSPITAL_COMMUNITY): Payer: Self-pay

## 2016-06-20 DIAGNOSIS — K56609 Unspecified intestinal obstruction, unspecified as to partial versus complete obstruction: Secondary | ICD-10-CM | POA: Diagnosis present

## 2016-06-20 LAB — COMPREHENSIVE METABOLIC PANEL
ALBUMIN: 4 g/dL (ref 3.5–5.0)
ALT: 35 U/L (ref 17–63)
ANION GAP: 14 (ref 5–15)
AST: 40 U/L (ref 15–41)
Alkaline Phosphatase: 54 U/L (ref 38–126)
BILIRUBIN TOTAL: 0.9 mg/dL (ref 0.3–1.2)
BUN: 18 mg/dL (ref 6–20)
CHLORIDE: 106 mmol/L (ref 101–111)
CO2: 20 mmol/L — ABNORMAL LOW (ref 22–32)
Calcium: 7.1 mg/dL — ABNORMAL LOW (ref 8.9–10.3)
Creatinine, Ser: 1.41 mg/dL — ABNORMAL HIGH (ref 0.61–1.24)
GFR calc Af Amer: >60 mL/min (ref >60)
GLUCOSE: 126 mg/dL — AB (ref 65–99)
POTASSIUM: 3.8 mmol/L (ref 3.5–5.1)
Sodium: 140 mmol/L (ref 135–145)
TOTAL PROTEIN: 6.9 g/dL (ref 6.5–8.1)

## 2016-06-20 LAB — URINALYSIS, ROUTINE W REFLEX MICROSCOPIC
Bacteria, UA: NONE SEEN
Bilirubin Urine: NEGATIVE
GLUCOSE, UA: NEGATIVE mg/dL
Ketones, ur: NEGATIVE mg/dL
Leukocytes, UA: NEGATIVE
Nitrite: NEGATIVE
PH: 5 (ref 5.0–8.0)
PROTEIN: 30 mg/dL — AB
Specific Gravity, Urine: 1.025 (ref 1.005–1.030)
Squamous Epithelial / LPF: NONE SEEN

## 2016-06-20 LAB — CBC
HCT: 45.3 % (ref 39.0–52.0)
Hemoglobin: 14.8 g/dL (ref 13.0–17.0)
MCH: 28.1 pg (ref 26.0–34.0)
MCHC: 32.7 g/dL (ref 30.0–36.0)
MCV: 86.1 fL (ref 78.0–100.0)
PLATELETS: 253 10*3/uL (ref 150–400)
RBC: 5.26 MIL/uL (ref 4.22–5.81)
RDW: 15.9 % — AB (ref 11.5–15.5)
WBC: 16.8 10*3/uL — AB (ref 4.0–10.5)

## 2016-06-20 LAB — TYPE AND SCREEN
ABO/RH(D): A POS
ANTIBODY SCREEN: NEGATIVE

## 2016-06-20 LAB — HIV ANTIBODY (ROUTINE TESTING W REFLEX): HIV SCREEN 4TH GENERATION: NONREACTIVE

## 2016-06-20 LAB — I-STAT CG4 LACTIC ACID, ED: Lactic Acid, Venous: 4.59 mmol/L (ref 0.5–1.9)

## 2016-06-20 LAB — ABO/RH: ABO/RH(D): A POS

## 2016-06-20 LAB — CK: CK TOTAL: 354 U/L (ref 49–397)

## 2016-06-20 MED ORDER — FENTANYL CITRATE (PF) 100 MCG/2ML IJ SOLN
50.0000 ug | Freq: Once | INTRAMUSCULAR | Status: AC
  Administered 2016-06-20: 50 ug via INTRAVENOUS
  Filled 2016-06-20: qty 2

## 2016-06-20 MED ORDER — LIDOCAINE VISCOUS 2 % MT SOLN
OROMUCOSAL | Status: AC
  Filled 2016-06-20: qty 15

## 2016-06-20 MED ORDER — DIPHENHYDRAMINE HCL 50 MG/ML IJ SOLN: 25.0000 mg | Freq: Four times a day (QID) | INTRAMUSCULAR | Status: DC | PRN

## 2016-06-20 MED ORDER — METOCLOPRAMIDE HCL 5 MG/ML IJ SOLN
5.0000 mg | Freq: Three times a day (TID) | INTRAMUSCULAR | Status: DC | PRN
  Administered 2016-06-20: 5 mg via INTRAVENOUS
  Filled 2016-06-20: qty 2

## 2016-06-20 MED ORDER — LIDOCAINE HCL 2 % EX GEL: 1.0000 "application " | Freq: Once | CUTANEOUS | Status: DC

## 2016-06-20 MED ORDER — ONDANSETRON 4 MG PO TBDP: 4.0000 mg | ORAL_TABLET | Freq: Four times a day (QID) | ORAL | Status: DC | PRN

## 2016-06-20 MED ORDER — BENZOCAINE (TOPICAL) 20 % EX AERO
INHALATION_SPRAY | Freq: Four times a day (QID) | CUTANEOUS | Status: DC | PRN
  Filled 2016-06-20 (×2): qty 57

## 2016-06-20 MED ORDER — ENOXAPARIN SODIUM 80 MG/0.8ML ~~LOC~~ SOLN
70.0000 mg | SUBCUTANEOUS | Status: DC
  Administered 2016-06-20: 70 mg via SUBCUTANEOUS
  Filled 2016-06-20 (×2): qty 0.8

## 2016-06-20 MED ORDER — CALCITRIOL 0.25 MCG PO CAPS: ORAL_CAPSULE | ORAL | Status: AC

## 2016-06-20 MED ORDER — LIDOCAINE VISCOUS 2 % MT SOLN
1.0000 "application " | Freq: Once | OROMUCOSAL | Status: AC
  Administered 2016-06-20: 1 via OROMUCOSAL
  Filled 2016-06-20: qty 15

## 2016-06-20 MED ORDER — KCL IN DEXTROSE-NACL 20-5-0.9 MEQ/L-%-% IV SOLN
INTRAVENOUS | Status: DC
Start: 2016-06-20 — End: 2016-06-21
  Administered 2016-06-20: 18:00:00 via INTRAVENOUS
  Filled 2016-06-20: qty 1000

## 2016-06-20 MED ORDER — SIMETHICONE 80 MG PO CHEW: 40.0000 mg | CHEWABLE_TABLET | Freq: Four times a day (QID) | ORAL | Status: DC | PRN

## 2016-06-20 MED ORDER — LIDOCAINE VISCOUS 2 % MT SOLN
15.0000 mL | Freq: Once | OROMUCOSAL | Status: AC
  Administered 2016-06-20: 15 mL via OROMUCOSAL

## 2016-06-20 MED ORDER — LEVOTHYROXINE SODIUM 100 MCG IV SOLR
125.0000 ug | Freq: Every day | INTRAVENOUS | Status: DC
  Administered 2016-06-20: 125 ug via INTRAVENOUS
  Filled 2016-06-20: qty 10

## 2016-06-20 MED ORDER — HYDRALAZINE HCL 20 MG/ML IJ SOLN: 10.0000 mg | INTRAMUSCULAR | Status: DC | PRN

## 2016-06-20 MED ORDER — PANTOPRAZOLE SODIUM 40 MG IV SOLR
40.0000 mg | Freq: Every day | INTRAVENOUS | Status: DC
  Administered 2016-06-20 (×2): 40 mg via INTRAVENOUS
  Filled 2016-06-20 (×3): qty 40

## 2016-06-20 MED ORDER — LEVOTHYROXINE SODIUM 125 MCG PO TABS: ORAL_TABLET | ORAL | Status: AC

## 2016-06-20 MED ORDER — LORAZEPAM 2 MG/ML IJ SOLN
0.5000 mg | INTRAMUSCULAR | Status: DC | PRN
  Administered 2016-06-20: 0.5 mg via INTRAVENOUS
  Filled 2016-06-20: qty 1

## 2016-06-20 MED ORDER — LORAZEPAM 2 MG/ML IJ SOLN: 0.5000 mg | INTRAMUSCULAR | Status: DC

## 2016-06-20 MED ORDER — SODIUM CHLORIDE 0.9 % IV SOLN
INTRAVENOUS | Status: DC
  Administered 2016-06-20 (×2): via INTRAVENOUS

## 2016-06-20 MED ORDER — SODIUM CHLORIDE 0.9 % IV SOLN
500.0000 mL | Freq: Once | INTRAVENOUS | Status: AC
  Administered 2016-06-20: 500 mL via INTRAVENOUS

## 2016-06-20 MED ORDER — LORAZEPAM 1 MG PO TABS: 1.0000 mg | ORAL_TABLET | Freq: Three times a day (TID) | ORAL | Status: DC | PRN

## 2016-06-20 MED ORDER — PIPERACILLIN-TAZOBACTAM 3.375 G IVPB
3.3750 g | Freq: Three times a day (TID) | INTRAVENOUS | Status: DC
  Administered 2016-06-20: 3.375 g via INTRAVENOUS
  Filled 2016-06-20 (×2): qty 50

## 2016-06-20 MED ORDER — CALCIUM CARBONATE 1250 (500 CA) MG PO CHEW: CHEWABLE_TABLET | ORAL | Status: AC

## 2016-06-20 MED ORDER — DIPHENHYDRAMINE HCL 25 MG PO CAPS: 25.0000 mg | ORAL_CAPSULE | Freq: Four times a day (QID) | ORAL | Status: DC | PRN

## 2016-06-20 MED ORDER — ONDANSETRON HCL 4 MG/2ML IJ SOLN
4.0000 mg | Freq: Four times a day (QID) | INTRAMUSCULAR | Status: DC | PRN
  Administered 2016-06-20 (×2): 4 mg via INTRAVENOUS
  Filled 2016-06-20 (×2): qty 2

## 2016-06-20 MED ORDER — LIDOCAINE VISCOUS 2 % MT SOLN
15.0000 mL | Freq: Once | OROMUCOSAL | Status: AC
  Administered 2016-06-20: 2 mL via OROMUCOSAL

## 2016-06-20 MED ORDER — MORPHINE SULFATE (PF) 4 MG/ML IV SOLN
2.0000 mg | INTRAVENOUS | Status: DC | PRN
  Administered 2016-06-20: 2 mg via INTRAVENOUS
  Filled 2016-06-20: qty 1

## 2016-06-20 NOTE — Progress Notes (Signed)
Asked patient if we could attempt NG tube placement. Patient stated not at this time. Notified Kinsinger via text page. Kinsinger returned call; no further orders at this time.

## 2016-06-20 NOTE — Discharge Summary (Signed)
Physician Discharge Summary  Patient ID: Lee Castillo MRN: [id number] DOB/AGE: 1975/10/22 41 y.o.  Admit date: 06/19/2016 Discharge date: 06/20/2016  Admission Diagnoses:  SBO 2 prior Umbilical hernia repairs [facility] Hx of Diverticultis Hx of ankylosing Spondylitis Hx of thyroid cancer   Discharge Diagnoses:  Same  Active Problems:   Small bowel obstruction ([place])   PROCEDURES: None  Hospital Course:  41 yo male with abdominal pain and nausea over the last 3 days presents with worsening pain today and vomiting multiple times. The pain is epigastric in nature. It has improved since his arrival to the ER. He has not had a bowel movement in the last day. He has had 2 umbilical hernia repairs, the last performed in 2016 at [facility] by retrorectus repair using a progrip mesh. Pt seen in the ED by Dr. Kieth Brightly, and reports that on the CT scan the posterior rectus fascia separating resulting in a new hernia. No signs of guarding or rebound. We have attempted to get an NG in several times. We are going to send him back to IR for another attempt later this AM, we have ordered Ativan to assist with his anxiety.   Pt seen this AM by Dr. Hulen Skains, and patient discussed care with him.  Dr. Hulen Skains reports patient would like to go back to [facility] where he has had two prior hernia surgeries.  I called [facility] and was able to speak with Dr. Donia Ast.  He will accept the patient in transfer.  [facility] will call with bed the floor when the bed is available.  I have ask [facility] to provide transportation to [facility] when bed is available.    Condition on D/C:  Stable  CBC Latest Ref Rng & Units 06/20/2016 06/19/2016 06/19/2016  WBC 4.0 - 10.5 K/uL 16.8(H) - 24.9(H)  Hemoglobin 13.0 - 17.0 g/dL 14.8 18.0(H) 18.6(H)  Hematocrit 39.0 - 52.0 % 45.3 53.0(H) 55.4(H)  Platelets 150 - 400 K/uL 253 - 309   CMP Latest Ref Rng & Units 06/20/2016 06/19/2016 06/19/2016  Glucose 65 - 99 mg/dL 126(H) 228(H) -  BUN 6 - 20 mg/dL 18 21(H) -  Creatinine  0.61 - 1.24 mg/dL 1.41(H) 1.80(H) -  Sodium 135 - 145 mmol/L 140 139 -  Potassium 3.5 - 5.1 mmol/L 3.8 3.8 -  Chloride 101 - 111 mmol/L 106 106 -  CO2 22 - 32 mmol/L 20(L) - -  Calcium 8.9 - 10.3 mg/dL 7.1(L) - -  Total Protein 6.5 - 8.1 g/dL 6.9 - 8.7(H)  Total Bilirubin 0.3 - 1.2 mg/dL 0.9 - 1.4(H)  Alkaline Phos 38 - 126 U/L 54 - 71  AST 15 - 41 U/L 40 - 48(H)  ALT 17 - 63 U/L 35 - 41    CXR 06/19/16:  Low lung volumes limit assessment. Patchy right infrahilar opacity may be atelectasis or pneumonia. Recommend PA and lateral views when patient is able. Prominent cardiac silhouette likely accentuated by technique   CT abd/Pelvis 06/19/16:  CLINICAL DATA:  Epigastric pain. Elevated lactate. Question ischemic colitis. EXAM: CT ABDOMEN AND PELVIS WITHOUT CONTRAST TECHNIQUE: Multidetector CT imaging of the abdomen and pelvis was performed following the standard protocol without IV contrast. COMPARISON:  CT 05/08/2010 FINDINGS: Lower chest: The lung bases are clear. Coronary artery calcifications are seen. Hepatobiliary: Decreased hepatic density consistent with steatosis. No evidence of focal lesion. Gallbladder physiologically distended, no calcified stone. No biliary dilatation. Pancreas: No ductal dilatation or inflammation. Spleen: Normal in size without focal abnormality. Adrenals/Urinary Tract:  No adrenal nodule. No hydronephrosis or perinephric edema. No urolithiasis. Urinary bladder is decompressed.   Stomach/Bowel: Dilated fluid-filled proximal small bowel to transition point within a complex ventral abdominal wall hernia, possible prior mesh repair. Dilated fluid-filled small bowel within and proximal to the hernia. Transition point at the hernia site with exiting small bowel decompressed at the left aspect of the mesh-hernia. Minimal fluid in the hernia sac. The stomach is distended with small hiatal hernia. No pneumatosis. Multifocal colonic diverticulosis throughout  the entire colon, advanced for age. Enteric sutures in the sigmoid. Normal appendix.   Vascular/Lymphatic: Abdominal aorta is normal in caliber. No adenopathy.   Reproductive: Prostate is unremarkable.   Other: Small fat containing left inguinal hernia. No ascites or free air.   Musculoskeletal: Stable sclerotic density in the left iliac bone consistent with bone island. There are no acute or suspicious osseous abnormalities.   IMPRESSION: 1. Small bowel obstruction with transition point within a complex umbilical/ ventral abdominal wall hernia. No evidence of bowel ischemia. 2. Extensive colonic diverticulosis throughout the entire colon, no evidence of acute diverticulitis. No colonic inflammation to suggest ischemic colitis. 3. Hepatic steatosis.       Disposition: 01-Home or Self Care   Allergies as of 06/20/2016      Reactions   Coconut Flavor    coconut   Compazine [prochlorperazine]       Medication List    STOP taking these medications   ibuprofen 200 MG tablet Commonly known as:  ADVIL,MOTRIN   LORazepam 1 MG tablet Commonly known as:  ATIVAN   oxyCODONE-acetaminophen 10-325 MG tablet Commonly known as:  PERCOCET   sertraline 100 MG tablet Commonly known as:  ZOLOFT     TAKE these medications   calcitRIOL 0.25 MCG capsule Commonly known as:  ROCALTROL For bone health.  Resume after cleared by [facility]. What changed:  how much to take  how to take this  when to take this  additional instructions   calcium carbonate 1250 (500 Ca) MG chewable tablet Commonly known as:  OS-CAL REsume when taking a regular diet/per [facility] recommendations What changed:  how much to take  how to take this  when to take this  additional instructions   fexofenadine 180 MG tablet Commonly known as:  ALLEGRA Take 180 mg by mouth as needed for allergies.   levothyroxine 125 MCG tablet Commonly known as:  SYNTHROID, LEVOTHROID Resume per instructions per [facility]   For low thyroid function What changed:  how much to take  how to take this  when to take this  additional instructions      Follow-up Information    [facility] Follow up.   Contact information: You are being transferred to [facility], and they will arrange follow up.       Lilia Argue R, DO Follow up.   Specialties:  Sports Medicine, Family Medicine Why:  [facility] and follow up after your hospitalization. Contact information: [address] [place] [address] [phone]           Signed: Earnstine Regal 06/20/2016, 11:15 AM

## 2016-06-20 NOTE — Progress Notes (Signed)
Patient has NG tube placed now, verified by xray, I hooked to low intermittent suction.

## 2016-06-20 NOTE — Progress Notes (Signed)
Patient still nauseated and vomiting this morning.  Will try to get NGT place with radiologic guidance this AM.  Patient wants to consider transfer to [facility] where he had his prior two hernia repairs done.  Currently he is being resuscitated with fluids and will need to increase his IVFs.  He does not have peritonitis.    Kathryne Eriksson. Dahlia Bailiff, MD, [place] [phone] [facility]

## 2016-06-20 NOTE — Discharge Instructions (Signed)
Transfer to [facility]

## 2016-06-20 NOTE — ED Notes (Signed)
Attempted X2 for NG tube placement. Pt did not tolerate.

## 2016-06-20 NOTE — Progress Notes (Signed)
Was notified by radiology that the patient's scans they were going to burn onto a disc was now going to be on paper so I would have to come to radiology and pick them up. I went down about an hour later and no one was there due to them closing at 1730. I called CT scan they said they could "power share" the scans over to [facility] that way they didn't need to burn it onto a disc.

## 2016-06-20 NOTE — H&P (Signed)
Lee Castillo is an 41 y.o. male.   Chief Complaint: vomiting HPI: 41 yo male with abdominal pain and nausea over the last 3 days presents with worsening pain today and vomiting multiple times. The pain is epigastric in nature. It has improved since his arrival to the ER. He has not had a bowel movement in the last day. He has had 2 umbilical hernia repairs, the last performed in 2016 at [facility] by retrorectus repair using a progrip mesh.  Past Medical History:  Diagnosis Date  . Diverticulitis   . Melanoma ([place])   . Spondylitis, ankylosing ([place])   . Thyroid cancer ([place])   . Umbilical hernia     Past Surgical History:  Procedure Laterality Date  . COLON SURGERY    . head surgery    . THYROID SURGERY      History reviewed. No pertinent family history. Social History:  reports that he has never smoked. He has never used smokeless tobacco. He reports that he drinks alcohol. His drug history is not on file.  Allergies:  Allergies  Allergen Reactions  . Coconut Flavor     coconut  . Compazine [Prochlorperazine]      (Not in a hospital admission)  Results for orders placed or performed during the hospital encounter of 06/19/16 (from the past 48 hour(s))  Basic metabolic panel     Status: Abnormal   Collection Time: 06/19/16  8:45 PM  Result Value Ref Range   Sodium 138 135 - 145 mmol/L   Potassium 4.0 3.5 - 5.1 mmol/L   Chloride 100 (L) 101 - 111 mmol/L   CO2 13 (L) 22 - 32 mmol/L   Glucose, Bld 222 (H) 65 - 99 mg/dL   BUN 17 6 - 20 mg/dL   Creatinine, Ser 2.01 (H) 0.61 - 1.24 mg/dL   Calcium 9.2 8.9 - 10.3 mg/dL   GFR calc non Af Amer 39 (L) >60 mL/min   GFR calc Af Amer 46 (L) >60 mL/min    Comment: (NOTE) The eGFR has been calculated using the CKD EPI equation. This calculation has not been validated in all clinical situations. eGFR's persistently <60 mL/min signify possible Chronic Kidney Disease.    Anion gap 25 (H) 5 - 15  CBC     Status: Abnormal   Collection  Time: 06/19/16  8:45 PM  Result Value Ref Range   WBC 24.9 (H) 4.0 - 10.5 K/uL   RBC 6.47 (H) 4.22 - 5.81 MIL/uL   Hemoglobin 18.6 (H) 13.0 - 17.0 g/dL   HCT 55.4 (H) 39.0 - 52.0 %   MCV 85.6 78.0 - 100.0 fL   MCH 28.7 26.0 - 34.0 pg   MCHC 33.6 30.0 - 36.0 g/dL   RDW 16.0 (H) 11.5 - 15.5 %   Platelets 309 150 - 400 K/uL  I-stat troponin, ED     Status: None   Collection Time: 06/19/16  8:58 PM  Result Value Ref Range   Troponin i, poc 0.00 0.00 - 0.08 ng/mL   Comment 3            Comment: Due to the release kinetics of cTnI, a negative result within the first hours of the onset of symptoms does not rule out myocardial infarction with certainty. If myocardial infarction is still suspected, repeat the test at appropriate intervals.   I-Stat CG4 Lactic Acid, ED     Status: Abnormal   Collection Time: 06/19/16  9:00 PM  Result Value Ref Range  Lactic Acid, Venous 9.74 (HH) 0.5 - 1.9 mmol/L   Comment NOTIFIED PHYSICIAN   Hepatic function panel     Status: Abnormal   Collection Time: 06/19/16  9:25 PM  Result Value Ref Range   Total Protein 8.7 (H) 6.5 - 8.1 g/dL   Albumin 5.2 (H) 3.5 - 5.0 g/dL   AST 48 (H) 15 - 41 U/L   ALT 41 17 - 63 U/L   Alkaline Phosphatase 71 38 - 126 U/L   Total Bilirubin 1.4 (H) 0.3 - 1.2 mg/dL   Bilirubin, Direct 0.1 0.1 - 0.5 mg/dL   Indirect Bilirubin 1.3 (H) 0.3 - 0.9 mg/dL  Lipase, blood     Status: None   Collection Time: 06/19/16  9:25 PM  Result Value Ref Range   Lipase 28 11 - 51 U/L  Acetaminophen level     Status: Abnormal   Collection Time: 06/19/16  9:25 PM  Result Value Ref Range   Acetaminophen (Tylenol), Serum <10 (L) 10 - 30 ug/mL    Comment:        THERAPEUTIC CONCENTRATIONS VARY SIGNIFICANTLY. A RANGE OF 10-30 ug/mL MAY BE AN EFFECTIVE CONCENTRATION FOR MANY PATIENTS. HOWEVER, SOME ARE BEST TREATED AT CONCENTRATIONS OUTSIDE THIS RANGE. ACETAMINOPHEN CONCENTRATIONS >150 ug/mL AT 4 HOURS AFTER INGESTION AND >50 ug/mL AT  12 HOURS AFTER INGESTION ARE OFTEN ASSOCIATED WITH TOXIC REACTIONS.   I-stat troponin, ED (not at [facility])     Status: None   Collection Time: 06/19/16  9:36 PM  Result Value Ref Range   Troponin i, poc 0.00 0.00 - 0.08 ng/mL   Comment 3            Comment: Due to the release kinetics of cTnI, a negative result within the first hours of the onset of symptoms does not rule out myocardial infarction with certainty. If myocardial infarction is still suspected, repeat the test at appropriate intervals.   I-Stat CG4 Lactic Acid, ED  (not at  [facility])     Status: Abnormal   Collection Time: 06/19/16  9:38 PM  Result Value Ref Range   Lactic Acid, Venous 6.56 (HH) 0.5 - 1.9 mmol/L   Comment NOTIFIED PHYSICIAN   I-stat chem 8, ed     Status: Abnormal   Collection Time: 06/19/16  9:38 PM  Result Value Ref Range   Sodium 139 135 - 145 mmol/L   Potassium 3.8 3.5 - 5.1 mmol/L   Chloride 106 101 - 111 mmol/L   BUN 21 (H) 6 - 20 mg/dL   Creatinine, Ser 1.80 (H) 0.61 - 1.24 mg/dL   Glucose, Bld 228 (H) 65 - 99 mg/dL   Calcium, Ion 0.82 (LL) 1.15 - 1.40 mmol/L   TCO2 17 0 - 100 mmol/L   Hemoglobin 18.0 (H) 13.0 - 17.0 g/dL   HCT 53.0 (H) 39.0 - 52.0 %  I-Stat Venous Blood Gas, ED (order at [facility] and [facility] only)     Status: Abnormal   Collection Time: 06/19/16 10:11 PM  Result Value Ref Range   pH, Ven 7.494 (H) 7.250 - 7.430   pCO2, Ven 23.9 (L) 44.0 - 60.0 mmHg   pO2, Ven 39.0 32.0 - 45.0 mmHg   Bicarbonate 18.4 (L) 20.0 - 28.0 mmol/L   TCO2 19 0 - 100 mmol/L   O2 Saturation 80.0 %   Acid-base deficit 2.0 0.0 - 2.0 mmol/L   Patient temperature HIDE    Sample type VENOUS    Comment NOTIFIED PHYSICIAN  Type and screen [place]     Status: None   Collection Time: 06/19/16 11:20 PM  Result Value Ref Range   ABO/RH(D) A POS    Antibody Screen NEG    Sample Expiration 06/22/2016   I-Stat CG4 Lactic Acid, ED  (not at  [facility])     Status: Abnormal   Collection Time:  06/20/16 12:17 AM  Result Value Ref Range   Lactic Acid, Venous 4.59 (HH) 0.5 - 1.9 mmol/L   Comment NOTIFIED PHYSICIAN    Ct Abdomen Pelvis Wo Contrast  Result Date: 06/19/2016 CLINICAL DATA:  Epigastric pain. Elevated lactate. Question ischemic colitis. EXAM: CT ABDOMEN AND PELVIS WITHOUT CONTRAST TECHNIQUE: Multidetector CT imaging of the abdomen and pelvis was performed following the standard protocol without IV contrast. COMPARISON:  CT 05/08/2010 FINDINGS: Lower chest: The lung bases are clear. Coronary artery calcifications are seen. Hepatobiliary: Decreased hepatic density consistent with steatosis. No evidence of focal lesion. Gallbladder physiologically distended, no calcified stone. No biliary dilatation. Pancreas: No ductal dilatation or inflammation. Spleen: Normal in size without focal abnormality. Adrenals/Urinary Tract: No adrenal nodule. No hydronephrosis or perinephric edema. No urolithiasis. Urinary bladder is decompressed. Stomach/Bowel: Dilated fluid-filled proximal small bowel to transition point within a complex ventral abdominal wall hernia, possible prior mesh repair. Dilated fluid-filled small bowel within and proximal to the hernia. Transition point at the hernia site with exiting small bowel decompressed at the left aspect of the mesh-hernia. Minimal fluid in the hernia sac. The stomach is distended with small hiatal hernia. No pneumatosis. Multifocal colonic diverticulosis throughout the entire colon, advanced for age. Enteric sutures in the sigmoid. Normal appendix. Vascular/Lymphatic: Abdominal aorta is normal in caliber. No adenopathy. Reproductive: Prostate is unremarkable. Other: Small fat containing left inguinal hernia. No ascites or free air. Musculoskeletal: Stable sclerotic density in the left iliac bone consistent with bone island. There are no acute or suspicious osseous abnormalities. IMPRESSION: 1. Small bowel obstruction with transition point within a complex  umbilical/ ventral abdominal wall hernia. No evidence of bowel ischemia. 2. Extensive colonic diverticulosis throughout the entire colon, no evidence of acute diverticulitis. No colonic inflammation to suggest ischemic colitis. 3. Hepatic steatosis. Electronically Signed   By: Jeb Levering M.D.   On: 06/19/2016 23:37   Dg Chest Port 1 View  Result Date: 06/19/2016 CLINICAL DATA:  Sepsis. Diaphoresis and tachypnea. Chest pain and shortness of breath. EXAM: PORTABLE CHEST 1 VIEW COMPARISON:  Chest radiograph 09/21/2011 FINDINGS: Low lung volumes limit assessment. Patchy right infrahilar opacity. Heart appears prominent size, likely accentuated by technique and low lung volumes. No convincing pulmonary edema. No evidence of pleural effusion, however left costophrenic angle excluded from the field of view. No pneumothorax. Surgical clips in supraclavicular soft tissues. IMPRESSION: Low lung volumes limit assessment. Patchy right infrahilar opacity may be atelectasis or pneumonia. Recommend PA and lateral views when patient is able. Prominent cardiac silhouette likely accentuated by technique. Electronically Signed   By: Jeb Levering M.D.   On: 06/19/2016 21:32    Review of Systems  Constitutional: Negative for chills and fever.  HENT: Negative for hearing loss.   Eyes: Negative for blurred vision and double vision.  Respiratory: Negative for cough and hemoptysis.   Cardiovascular: Negative for chest pain and palpitations.  Gastrointestinal: Positive for abdominal pain, nausea and vomiting.  Genitourinary: Negative for dysuria and urgency.  Musculoskeletal: Negative for myalgias and neck pain.  Skin: Negative for itching and rash.  Neurological: Negative for dizziness, tingling and  headaches.  Endo/Heme/Allergies: Does not bruise/bleed easily.  Psychiatric/Behavioral: Negative for depression and suicidal ideas.    Blood pressure (!) 147/96, pulse (!) 106, temperature 97.5 F (36.4 C),  temperature source Oral, resp. rate 19, height '6\' 2"'  (1.88 m), weight (!) 142.9 kg (315 lb), SpO2 100 %. Physical Exam  Vitals reviewed. Constitutional: He is oriented to person, place, and time. He appears well-developed and well-nourished.  HENT:  Head: Normocephalic and atraumatic.  Eyes: Conjunctivae and EOM are normal. Pupils are equal, round, and reactive to light.  Neck: Normal range of motion. Neck supple.  Cardiovascular: Normal rate and regular rhythm.   Respiratory: Effort normal and breath sounds normal.  GI: Soft. He exhibits distension. There is no tenderness.  Musculoskeletal: Normal range of motion.  Neurological: He is alert and oriented to person, place, and time.  Skin: Skin is warm and dry.  Psychiatric: He has a normal mood and affect. His behavior is normal.     Assessment/Plan 41 yo male with bowel obstruction and what appears to be the posterior rectus fascia separating resulting in a new hernia. No signs of guarding or rebound. Labs have improved since the resuscitation started -NG tube placement -recheck labs -low threshold to operate but will give a chance for non-operative management  Mickeal Skinner, MD 06/20/2016, 12:44 AM

## 2016-06-20 NOTE — Progress Notes (Signed)
[facility] and spoke with Dr. Donia Ast.  Pt has requested transfer.  They have agreed to accept him.  They will call the floor when bed is available.   I will ask [facility] to provide transfer to [facility] when available. We have tried to get an NG in here on the floor and in Radiology and failed. He apparently pulled the tech's hand and the NG in the process of getting it down.  We will try again later and I have ordered some IV ativan to assist with his anxiety.

## 2016-06-21 NOTE — Progress Notes (Signed)
Pt transferring to [facility] [phone]) tonight. Called report to receiving nurse Anna,RN. Called [facility] for transport.

## 2016-06-21 NOTE — Progress Notes (Signed)
On call Dr Redmond Pulling text paged and aware of pt transferring to [facility]. [facility] is here to transfer pt to [facility].

## 2016-06-24 ENCOUNTER — Encounter: Payer: Self-pay | Admitting: Sports Medicine

## 2016-06-24 LAB — CULTURE, BLOOD (ROUTINE X 2)
Culture: NO GROWTH
Culture: NO GROWTH
SPECIAL REQUESTS: ADEQUATE
Special Requests: ADEQUATE

## 2016-07-16 ENCOUNTER — Ambulatory Visit
Admission: RE | Admit: 2016-07-16 | Discharge: 2016-07-16 | Disposition: A | Discharge status: Home / Self Care | Admitting: Surgery

## 2016-07-16 DIAGNOSIS — Z09 Encounter for follow-up examination after completed treatment for conditions other than malignant neoplasm: Principal | ICD-10-CM

## 2016-08-29 ENCOUNTER — Ambulatory Visit: Admission: RE | Admit: 2016-08-29 | Discharge: 2016-08-29 | Attending: Psychiatry | Admitting: Psychiatry

## 2016-08-29 DIAGNOSIS — F329 Major depressive disorder, single episode, unspecified: Principal | ICD-10-CM

## 2016-08-30 MED ORDER — SERTRALINE 100 MG TABLET
ORAL_TABLET | Freq: Every day | ORAL | 2 refills | 0.00000 days | Status: CP
Start: 2016-08-30 — End: 2016-10-24

## 2016-10-24 ENCOUNTER — Ambulatory Visit: Admission: RE | Admit: 2016-10-24 | Discharge: 2016-10-24 | Attending: Psychiatry | Admitting: Psychiatry

## 2016-10-24 DIAGNOSIS — F329 Major depressive disorder, single episode, unspecified: Principal | ICD-10-CM

## 2016-10-24 MED ORDER — SERTRALINE 100 MG TABLET
ORAL_TABLET | Freq: Every day | ORAL | 0 refills | 0 days | Status: CP
Start: 2016-10-24 — End: 2017-01-25

## 2016-12-11 ENCOUNTER — Ambulatory Visit: Admission: RE | Admit: 2016-12-11 | Discharge: 2016-12-11 | Disposition: A | Discharge status: Home / Self Care

## 2016-12-11 ENCOUNTER — Ambulatory Visit
Admission: RE | Admit: 2016-12-11 | Discharge: 2016-12-11 | Disposition: A | Discharge status: Home / Self Care | Attending: Hematology & Oncology

## 2016-12-11 DIAGNOSIS — C439 Malignant melanoma of skin, unspecified: Principal | ICD-10-CM

## 2017-01-01 ENCOUNTER — Ambulatory Visit: Admission: RE | Admit: 2017-01-01 | Discharge: 2017-01-01 | Disposition: A | Discharge status: Home / Self Care

## 2017-01-01 DIAGNOSIS — C73 Malignant neoplasm of thyroid gland: Principal | ICD-10-CM

## 2017-01-23 ENCOUNTER — Ambulatory Visit: Admit: 2017-01-23 | Discharge: 2017-01-24 | Attending: Psychiatry | Primary: Psychiatry

## 2017-01-23 DIAGNOSIS — F329 Major depressive disorder, single episode, unspecified: Principal | ICD-10-CM

## 2017-02-27 ENCOUNTER — Ambulatory Visit: Admit: 2017-02-27 | Discharge: 2017-02-27 | Payer: PRIVATE HEALTH INSURANCE

## 2017-02-27 DIAGNOSIS — C73 Malignant neoplasm of thyroid gland: Principal | ICD-10-CM

## 2017-02-27 DIAGNOSIS — E890 Postprocedural hypothyroidism: Secondary | ICD-10-CM

## 2017-03-02 MED ORDER — LEVOTHYROXINE 125 MCG TABLET
ORAL_TABLET | 4 refills | 0 days | Status: CP
Start: 2017-03-02 — End: 2017-08-09

## 2017-03-02 MED ORDER — CALCITRIOL 0.25 MCG CAPSULE
ORAL_CAPSULE | 4 refills | 0 days | Status: CP
Start: 2017-03-02 — End: 2017-06-30

## 2017-06-11 ENCOUNTER — Encounter: Admit: 2017-06-11 | Discharge: 2017-06-11 | Payer: PRIVATE HEALTH INSURANCE

## 2017-06-11 ENCOUNTER — Encounter
Admit: 2017-06-11 | Discharge: 2017-06-11 | Payer: PRIVATE HEALTH INSURANCE | Attending: Hematology & Oncology | Primary: Hematology & Oncology

## 2017-06-11 DIAGNOSIS — C439 Malignant melanoma of skin, unspecified: Secondary | ICD-10-CM

## 2017-06-11 DIAGNOSIS — C73 Malignant neoplasm of thyroid gland: Principal | ICD-10-CM

## 2017-06-30 ENCOUNTER — Encounter: Admit: 2017-06-30 | Discharge: 2017-07-01 | Payer: PRIVATE HEALTH INSURANCE

## 2017-06-30 DIAGNOSIS — E890 Postprocedural hypothyroidism: Secondary | ICD-10-CM

## 2017-06-30 DIAGNOSIS — C73 Malignant neoplasm of thyroid gland: Principal | ICD-10-CM

## 2017-06-30 MED ORDER — CALCITRIOL 0.25 MCG CAPSULE
ORAL_CAPSULE | 4 refills | 0 days | Status: CP
Start: 2017-06-30 — End: 2018-01-26

## 2017-08-11 MED ORDER — LEVOTHYROXINE 125 MCG TABLET
ORAL_TABLET | 3 refills | 0 days | Status: CP
Start: 2017-08-11 — End: 2018-05-12

## 2017-12-17 ENCOUNTER — Encounter: Admit: 2017-12-17 | Discharge: 2017-12-18 | Payer: PRIVATE HEALTH INSURANCE

## 2017-12-17 ENCOUNTER — Encounter
Admit: 2017-12-17 | Discharge: 2017-12-18 | Payer: PRIVATE HEALTH INSURANCE | Attending: Hematology & Oncology | Primary: Hematology & Oncology

## 2017-12-17 DIAGNOSIS — C439 Malignant melanoma of skin, unspecified: Secondary | ICD-10-CM

## 2017-12-17 DIAGNOSIS — E890 Postprocedural hypothyroidism: Secondary | ICD-10-CM

## 2017-12-17 DIAGNOSIS — C73 Malignant neoplasm of thyroid gland: Principal | ICD-10-CM

## 2017-12-17 DIAGNOSIS — E039 Hypothyroidism, unspecified: Secondary | ICD-10-CM

## 2017-12-25 ENCOUNTER — Encounter: Admit: 2017-12-25 | Discharge: 2017-12-26 | Payer: PRIVATE HEALTH INSURANCE

## 2017-12-25 DIAGNOSIS — C439 Malignant melanoma of skin, unspecified: Principal | ICD-10-CM

## 2017-12-30 MED ORDER — PEG-ELECTROLYTE SOLUTION 420 GRAM ORAL SOLUTION
Freq: Once | ORAL | 0 refills | 0 days | Status: CP
Start: 2017-12-30 — End: 2017-12-31

## 2017-12-31 MED ORDER — PEG-ELECTROLYTE SOLUTION 420 GRAM ORAL SOLUTION
0 refills | 0 days | Status: CP
Start: 2017-12-31 — End: 2018-02-12

## 2018-01-05 DIAGNOSIS — R933 Abnormal findings on diagnostic imaging of other parts of digestive tract: Principal | ICD-10-CM

## 2018-01-09 ENCOUNTER — Encounter
Admit: 2018-01-09 | Discharge: 2018-01-13 | Payer: PRIVATE HEALTH INSURANCE | Attending: Anesthesiology | Primary: Anesthesiology

## 2018-01-09 ENCOUNTER — Encounter: Admit: 2018-01-09 | Discharge: 2018-01-13 | Payer: PRIVATE HEALTH INSURANCE

## 2018-01-09 DIAGNOSIS — R933 Abnormal findings on diagnostic imaging of other parts of digestive tract: Principal | ICD-10-CM

## 2018-01-26 MED ORDER — CALCITRIOL 0.25 MCG CAPSULE
ORAL_CAPSULE | 1 refills | 0 days | Status: CP
Start: 2018-01-26 — End: 2018-08-28

## 2018-01-29 ENCOUNTER — Ambulatory Visit: Admit: 2018-01-29 | Discharge: 2018-01-30 | Payer: PRIVATE HEALTH INSURANCE

## 2018-01-29 ENCOUNTER — Encounter
Admit: 2018-01-29 | Discharge: 2018-01-30 | Payer: PRIVATE HEALTH INSURANCE | Attending: Hematology & Oncology | Primary: Hematology & Oncology

## 2018-01-29 DIAGNOSIS — C439 Malignant melanoma of skin, unspecified: Principal | ICD-10-CM

## 2018-02-12 ENCOUNTER — Encounter: Admit: 2018-02-12 | Discharge: 2018-02-13 | Payer: PRIVATE HEALTH INSURANCE

## 2018-02-12 DIAGNOSIS — C439 Malignant melanoma of skin, unspecified: Principal | ICD-10-CM

## 2018-03-12 ENCOUNTER — Other Ambulatory Visit: Admit: 2018-03-12 | Discharge: 2018-03-12

## 2018-03-12 ENCOUNTER — Ambulatory Visit: Admit: 2018-03-12 | Discharge: 2018-03-12

## 2018-03-12 ENCOUNTER — Ambulatory Visit: Admit: 2018-03-12 | Discharge: 2018-03-12 | Attending: Hematology & Oncology | Primary: Hematology & Oncology

## 2018-03-12 DIAGNOSIS — C439 Malignant melanoma of skin, unspecified: Principal | ICD-10-CM

## 2018-03-30 MED ORDER — PREDNISONE 20 MG TABLET
ORAL_TABLET | Freq: Every day | ORAL | 1 refills | 0.00000 days | Status: CP
Start: 2018-03-30 — End: 2018-05-19

## 2018-04-24 MED ORDER — PREDNISONE 5 MG TABLET
ORAL_TABLET | Freq: Every day | ORAL | 1 refills | 0 days | Status: CP
Start: 2018-04-24 — End: 2018-05-19

## 2018-05-12 MED ORDER — LEVOTHYROXINE 125 MCG TABLET
ORAL_TABLET | 3 refills | 0 days | Status: CP
Start: 2018-05-12 — End: 2018-07-28

## 2018-05-13 ENCOUNTER — Other Ambulatory Visit: Admit: 2018-05-13 | Discharge: 2018-05-13

## 2018-05-13 ENCOUNTER — Ambulatory Visit: Admit: 2018-05-13 | Discharge: 2018-05-13

## 2018-05-13 ENCOUNTER — Telehealth: Admit: 2018-05-13 | Discharge: 2018-05-13 | Attending: Hematology & Oncology | Primary: Hematology & Oncology

## 2018-05-13 DIAGNOSIS — C439 Malignant melanoma of skin, unspecified: Principal | ICD-10-CM

## 2018-05-14 MED ORDER — BRAFTOVI 75 MG CAPSULE
ORAL_CAPSULE | Freq: Every day | ORAL | 11 refills | 30.00000 days | Status: CP
Start: 2018-05-14 — End: ?
  Filled 2018-05-27: qty 180, 30d supply, fill #0

## 2018-05-14 MED ORDER — MEKTOVI 15 MG TABLET
ORAL_TABLET | Freq: Two times a day (BID) | ORAL | 11 refills | 30.00000 days | Status: CP
Start: 2018-05-14 — End: ?
  Filled 2018-05-27: qty 180, 30d supply, fill #0

## 2018-05-18 ENCOUNTER — Institutional Professional Consult (permissible substitution): Admit: 2018-05-18 | Discharge: 2018-05-19 | Attending: Oncology | Primary: Oncology

## 2018-05-18 DIAGNOSIS — C439 Malignant melanoma of skin, unspecified: Principal | ICD-10-CM

## 2018-05-19 ENCOUNTER — Encounter: Admit: 2018-05-19 | Discharge: 2018-05-20 | Payer: PRIVATE HEALTH INSURANCE

## 2018-05-19 DIAGNOSIS — C439 Malignant melanoma of skin, unspecified: Principal | ICD-10-CM

## 2018-05-19 DIAGNOSIS — R740 Nonspecific elevation of levels of transaminase and lactic acid dehydrogenase [LDH]: Principal | ICD-10-CM

## 2018-05-19 MED ORDER — ONDANSETRON HCL 8 MG TABLET
ORAL_TABLET | Freq: Three times a day (TID) | ORAL | 2 refills | 0.00000 days | Status: CP | PRN
Start: 2018-05-19 — End: 2019-05-19

## 2018-05-25 NOTE — Unmapped (Signed)
Discussed recent CMP results with Mr. Moening. We will plan for him to start Braftovi and [name] once meds are delivered by [facility] and recheck CMP 1 week after starting. He will get labs done at local [facility] next Wed or Thurs; orders entered.    Bobby Rumpf, PharmD, BCOP, CPP  Melanoma Clinical Pharmacist Practitioner  Pager: [phone]

## 2018-05-25 NOTE — Unmapped (Signed)
[facility] Special[facility] Medication Referral: PA and Financial Assistance Approved      Medication (Brand/Generic): BRAFTOVI AND [name]    Final Test Claim completed with resulted information below:    Patient ABLE to fill at [facility]  Insurance Company:  [employer]  Anticipated Copay: $0  Is anticipated copay with a copay card or grant? Yes, there was a grant approved for this patient for this medication.     Does patient's insurance plan only allow a 15 day supply for the first 6 fills in the [facility] [facility]? NO  If yes, inform patient they can request to dis-enroll from the [facility] by calling the patient help desk at NOT APPLICABLE.      If the copay is under the $25 defined limit, per policy there will be no further investigation of need for financial assistance at this time unless patient requests. This referral has been communicated to the provider and handed off to the [facility] team for further processing and filling of prescribed medication.   ______________________________________________________________________  Please utilize this referral for viewing purposes as it will serve as the central location for all relevant documentation and updates.

## 2018-05-26 NOTE — Unmapped (Signed)
[facility]   Patient Onboarding/Medication Counseling    Kenneth Fitzgerald is a 43 y.o. male with malignant melanoma of unspecified site who I am counseling today on initiation of therapy.  I am speaking to the patient.    Verified patient's date of birth / HIPAA.    Specialty medication(s) to be sent: Hematology/Oncology: [name]      Non-specialty medications/supplies to be sent: n/a      Medications not needed at this time: n/a         Braftovi (Encorafenib)    Medication & Administration     Dosage:    ? Melanoma (unresectable or metastatic): Take six 75mg  capsules (450 mg) by mouth once daily. (in combination with binimetinib)     Administration:     ? Administer orally at the same time each day.   ? May be administered with or without food.  ? If counseling for use in combination with panitumumab  o Apply moisturizing cream (e.g. Aquaphor) twice daily to the face, hands, feet, neck, back and chest.   o Apply sunscreen with SPF at least 30 prior to going out in the sun.   o Take doxycycline 100 mg PO Q12H one day before start of infusion therapy    Adherence/Missed dose instructions:   ? Take a missed dose as soon as you think about.  If it is less than 12 hours until the next dose, skip the missed dose and go back to your normal time. Do not take 2 doses at the same time or extra doses.    Lab Requirement:   BRAF V600 mutation status confirmed and documented in chart    Goals of Therapy     ? To prevent disease progression    Side Effects & Monitoring Parameters     Commonly reported side effects  ? Fatigue  ? Nausea, vomiting  ? Diarrhea  ? Constipation  ? Abdominal pain  ? Rash  ? Itching  ? Change in taste  ? Painful extremities  ? Muscle pain, joint pain, back pain  ? Hard, thick, dry skin  ? Dizziness   ? Headache  ? Hair loss  ? Hand foot syndrome  ? Nail and skin changes (including acneiform rash)    The following side effects should be reported to the provider:  ? Signs of infection (fever >100.4, chills, mouth sores, sputum production)  ? Signs of hand foot syndrome (redness or irritation of palms or soles of feet)  ? Severe or persistent loss of energy and strength  ? Signs of bleeding (vomiting or coughing up blood, blood that looks like coffee grounds, blood in the urine or black, red tarry stools, bruising that gets bigger without reason, any persistent or severe bleeding, impaired wound healing)  ? Signs of cerebrovascular disease (change in strength on one side is greater than the other, trouble speaking or thinking, change in balance, vision changes)  ? Signs of fluid and electrolyte problems (mood changes, confusion, muscle pain or weakness, abnormal/fast heartbeat, severe dizziness, passing out)  ? Signs of high blood sugar (confusion, fatigue, increased thirst, increase hunger, passing a lot of urine, flushing, fast breathing, breathe that smells like fruit)  ? Vison changes (eye pain, severe eye irritation, seeing colored dots, seeing halos or bright lights around lights)  ? Mole changes  ? Skin changes  ? Impaired skin wound healing  ? Burning or numbness feeling  ? Fast heartbeat, abnormal heartbeat or passing  out  ? Signs of anaphylaxis (wheezing, chest tightness, swelling of face, lips, tongue or throat)  ? Very bad headache    Monitoring Parameters:   ? [id number] or V600E mutation status (prior to treatment)  ? Electrolytes  ? Verify pregnancy status in females of reproductive potential prior to treatment initiation  ? Dermatologic evaluations prior to initiation, every 2 months during therapy, and for up to 6 months following discontinuation to assess for new cutaneous malignancies  ? Monitor for signs/symptoms of uveitis, hemorrhage, and dermatologic toxicity, and for non-cutaneous malignancies.   ? LFTs and Renal Function tests.  ? Ophthalmological tests  ? Monitor adherence.    Contraindications, Warnings, & Precautions     ? Dermatologic toxicity: Grade 3 or 4 dermatologic toxicity occurred in ~20% of patients receiving encorafenib as a single agent, compared to 2% of patients receiving the combination of encorafenib and binimetinib.  ? GI toxicity: Encorafenib is associated with a moderate emetic potential; antiemetics may be recommended to prevent nausea and vomiting.  ? Hemorrhage: Hemorrhage, including ? grade 3 events, may occur when administered in combination with other agents.   ? Malignancy: New primary malignancies (cutaneous and non-cutaneous) have been observed in patients treated with BRAF inhibitors and may occur with encorafenib.   ? Ocular toxicity: Uveitis has been observed with the combination of encorafenib and binimetinib.   ? QT prolongation: Encorafenib is associated with dose dependent QTc interval prolongation.   ? Appropriate use: Encorafenib is not indicated for treatment of patients with wild-type BRAF melanoma or wild-type BRAF colorectal cancer. Exposing BRAF wild-type cells to BRAF inhibitors such as encorafenib may result in paradoxical activation of MAP-kinase signaling and increased cell proliferation. Prior to initiating therapy, confirm BRAF V600E or BRAF V600K mutation status with an approved test.   ? Reproductive Considerations: Verify pregnancy status in females of reproductive potential prior to initiating encorafenib therapy. Females of reproductive potential should use a highly effective non-hormonal contraceptive during therapy and for at least 2 weeks after the last encorafenib dose; hormonal contraceptives may not be effective. Based on its mechanism of action and on findings in animal reproduction studies, encorafenib may cause fetal harm if administered during pregnancy.  ? Breastfeeding Considerations:  It is not known if encorafenib is present in breast milk.  Due to the potential for serious adverse reactions in the breastfed infant, breastfeeding is not recommended by the manufacturer during treatment and for 2 weeks after the last encorafenib dose.    Drug/Food Interactions     ? Medication list reviewed in Epic. The patient was instructed to inform the care team before taking any new medications or supplements. No drug interactions identified.   ? Avoid grapefruit and grapefruit juice (may increase encorafenib concentrations).    Storage, Handling Precautions, & Disposal     ? Store at room temperature in the original container (do not use a pillbox or store with other medications). Do not take out of the anti-moisture cube or packet.   ? Store in a dry place.  Do not store in a bathroom.    ? Caregivers helping administer medication should wear gloves and wash hands immediately after.    ? Keep the lid tightly closed. Keep out of the reach of children and pets.  ? Do not flush down a toilet or pour down a drain unless instructed to do so.  Check with your local police department or fire station about drug take-back programs in your area.  [name] (Binimetinib)    Medication & Administration     Dosage:   ? Melanoma: 45 mg by mouth twice daily (in combination with encorafenib)    Administration:   ? Take about 12 hours apart  ? Take with or without food at the same scheduled time(s) each day for each dose.      Adherence/Missed dose instructions:  ? Do not administer a missed dose if <6 hours until the next dose.   ? Do not take an additional dose if vomiting occurs; resume with the next scheduled dose.  ? If taken in combination with paniumumab infusion:  ? Apply moisturizing cream (e.g. Aquaphor) twice daily to the face, hands, feet, neck, back and chest.   ? Apply sunscreen with SPF of at least 30 prior to sun exposure.   ? Take Doxycycline 100mg  by mouth every 12 hours starting 1 day before starting infusion    Lab Requirement (IF USED WITH BRAFTOVI):  BRAF V600 mutation status confirmed and documented in chart       Goals of Therapy     ? To prevent disease progression    Side Effects & Monitoring Parameters Commonly reported side effects  ? Nausea  ? Vomiting  ? Diarrhea  ? Abdominal Pain  ? Constipation  ? Changes in vision  ? Peripheral edema  ? Rash  ? Fatigue    The following side effects should be reported to the provider:  ? Signs of infection (fever >100.4, chills, mouth sores, sputum production)  ? Severe or persistent loss of energy and strength  ? Severe headache, dizziness, passing out  ? Signs of blood clots (numbness or weakness on one side of the body, pain, redness, tenderness, warmth, or swelling in the arms or legs, change in color of an arm of leg, chest pain, shortness of breath, fast heartbeat)  ? Signs of bleeding (vomiting or coughing up blood, blood that looks like coffee grounds, blood in the urine or black, red tarry stools, bruising that gets bigger without reason, any persistent or severe bleeding, impaired wound healing)  ? Signs of a severe pulmonary disorder (trouble breathing, shortness of breath, a cough that is new or worse).    ? Signs of liver problems (dark urine, abdominal pain, light-colored stools, vomiting, yellow skin or eyes)  ? Signs of low sodium (head, trouble focusing, trouble with memory, confusion, weakness, seizures, change in balance)  ? Vison changes (eye pain, swelling, redness, blurred vision, blindness severe eye irritation, seeing colored dots, seeing halos or bright lights around lights)  ? Signs of heart problems (cough or shortness of breath that is new or worse, swelling of ankle of legs, abnormal heartbeat, weight gain of 5 of more lbs. in 24 hours, dizziness or passing out)  ? Signs of anaphylaxis (wheezing, chest tightness, swelling of face, lips, tongue or throat)    Monitoring Parameters: BRAFV600K or V600E mutation status (prior to treatment); monitor liver function tests prior to treatment initiation, monthly during treatment, and as clinically indicated; monitor CPK and creatinine levels prior to treatment initiation, periodically during treatment, and as clinically indicated. Verify pregnancy status in females of reproductive potential prior to treatment initiation. Assess ejection fraction, prior to treatment initiation, one month after initiation, and then every 2 to 3 months during treatment; Evaluate visual symptoms at each visit; perform an ophthalmologic examination at regular intervals and for new or worsening visual disturbances; perform ophthalmologic evaluation for patient-reported acute vision loss or other visual disturbance within  24 hours. Monitor for signs/symptoms of dermatologic toxicity, hemorrhage, interstitial lung disease, ocular toxicity, rhabdomyolysis, and thromboembolism. Monitor adherence.    Contraindications, Warnings, & Precautions     ? Females of reproductive potential should use a highly effective contraceptive during therapy and for at least 30 days after the final dose.  ? Breastfeeding is not recommended during treatment and for 3 days after the last dose.  ? Cardiotoxicity: Cardiomyopathy has been reported in patients who received the combination of binimetinib and encorafenib.   ? GI toxicity: Binimetinib is associated with a moderate emetic potential.  ? Hemorrhage: Hemorrhage may occur with the combination of binimetinib and encorafenib, including ? grade 3 hemorrhage. The most frequent hemorrhagic events were gastrointestinal in nature. Fatal intracranial hemorrhage was also reported.   ? Hepatotoxicity: Hepatotoxicity may occur with the combination of binimetinib and encorafenib; grade 3 or 4 elevations in ALT, AST, and/or alkaline phosphatase have been reported.  ? Ocular toxicity: Serous retinopathy has occurred with the combination of binimetinib and encorafenib. Symptomatic serous retinopathy has been reported, although blindness did not occur. Uveitis has been reported in patients treated with the combination of binimetinib and encorafenib  ? Pulmonary toxicity: Cases of interstitial lung disease (ILD), including pneumonitis, developed in patients with BRAF mutation-positive melanoma receiving the combination of binimetinib and encorafenib.   ? Rhabdomyolysis: Rhabdomyolysis may occur with the combination of binimetinib and encorafenib; CPK elevations may commonly occur.   ? Thromboembolism: VTE, including PE, has occurred in patients receiving binimetinib in combination with encorafenib.   ? Appropriate use: Prior to initiating therapy, confirm BRAF V600E or BRAF V600K mutation status with an approved test.   ? Reproductive considerations: Verify pregnancy status in females of reproductive potential prior to initiating binimetinib therapy. Females of reproductive potential should use a highly effective contraceptive during therapy and for at least 30 days after the final binimetinib dose. Based on its mechanism of action and on findings in animal reproduction studies, binimetinib may cause fetal harm if administered during pregnancy.  ? Breastfeeding considerations: It is not known if binimetinib is present in breast milk.  Due to the potential for serious adverse reactions in the breastfed infant, breastfeeding is not recommended by the manufacturer during treatment and for 3 days after the last binimetinib dose.    Drug/Food Interactions     ? Medication list reviewed in Epic. The patient was instructed to inform the care team before taking any new medications or supplements. No drug interactions identified.     Storage, Handling Precautions, & Disposal     ? Store at room temperature in the original container (do not use a pillbox or store with other medications).   ? Store in a dry place.  Do not store in a bathroom.    ? Caregivers helping administer medication should wear gloves and wash hands immediately after.  Store at room temperature in a dry location.  ? Keep away from children and pets.  ? Throw away unused or expired drugs. There may be drug take-back programs in your area.Keep the lid tightly closed. Keep out of the reach of children and pets.  ? Do not flush down a toilet or pour down a drain unless instructed to do so.  Check with your local police department or fire station about drug take-back programs in your area.        Current Medications (including OTC/herbals), Comorbidities and Allergies     Current Outpatient Medications   Medication Sig Dispense Refill   ???  binimetinib ([name]) 15 mg tablet Take 3 tablets (45 mg total) by mouth Two (2) times a day. 180 tablet 11   ??? calcitriol (ROCALTROL) 0.25 MCG capsule TAKE 1 CAPSULE BY MOUTH DAILY 90 capsule 1   ??? calcium carbonate (OS-CAL) 500 mg calcium (1,250 mg) chewable tablet Chew 2,000 mg of elem calcium daily.      ??? encorafenib (BRAFTOVI) 75 mg capsule Take 6 capsules (450 mg total) by mouth daily. 180 capsule 11   ??? fexofenadine (ALLEGRA ALLERGY) 180 MG tablet Take 180 mg by mouth.     ??? ibuprofen (ADVIL,MOTRIN) 200 MG tablet Take 200 mg by mouth every eight (8) hours as needed.      ??? levothyroxine (SYNTHROID) 125 MCG tablet TAKE (2 TABS) ON 5 DAYS/WEEK, AND TAKE 375 MCG (3 TABS) ON 2 DAYS/WEEK 192 tablet 3   ??? ondansetron (ZOFRAN) 8 MG tablet Take 1 tablet (8 mg total) by mouth every eight (8) hours as needed for nausea. 30 tablet 2     No current facility-administered medications for this visit.        Allergies   Allergen Reactions   ??? Compazine [Prochlorperazine] Itching   ??? Coconut Nausea And Vomiting       Patient Active Problem List   Diagnosis   ??? Reactive depression (RAF-HCC)   ??? Malignant neoplasm of thyroid gland (CMS-HCC)   ??? Postoperative hypothyroidism   ??? Hypocalcemia   ??? Thyroid cancer (CMS-HCC)   ??? Melanoma (CMS-HCC)   ??? Ventral hernia with bowel obstruction       Reviewed and up to date in Epic.    Appropriateness of Therapy     Is medication and dose appropriate based on diagnosis? Yes    Baseline Quality of Life Assessment      How many days over the past month did your cancer keep you from your normal activities? 0    Financial Information     Medication Assistance provided: Prior Authorization and [employer]    Anticipated copay of $0 reviewed with patient. Verified delivery address.    Delivery Information     Scheduled delivery date: 05/28/18    Expected start date: 05/29/18    Medication will be delivered via UPS to the home address in [facility].  This shipment will not require a signature.      Explained the services we provide at [facility] and that each month we would call to set up refills.  Stressed importance of returning phone calls so that we could ensure they receive their medications in time each month.  Informed patient that we should be setting up refills 7-10 days prior to when they will run out of medication.  A pharmacist will reach out to perform a clinical assessment periodically.  Informed patient that a welcome packet and a drug information handout will be sent.      Patient verbalized understanding of the above information as well as how to contact the pharmacy at [phone] option 4 with any questions/concerns.  The pharmacy is open Monday through Friday 8:30am-4:30pm.  A pharmacist is available 24/7 via pager to answer any clinical questions they may have.    Patient Specific Needs     ? Does the patient have any physical, cognitive, or cultural barriers? No    ? Patient prefers to have medications discussed with  Patient     ? Is the patient able to read and understand education materials at a  high school level or above? No    ? Patient's primary language is  English     ? Is the patient high risk? No     ? Does the patient require a Care Management Plan? No     ? Does the patient require physician intervention or other additional services (i.e. nutrition, smoking cessation, social work)? No      Randolph Shellhammer  Anders Grant  [facility] Specialty Pharmacist

## 2018-05-27 MED FILL — MEKTOVI 15 MG TABLET: 30 days supply | Qty: 180 | Fill #0 | Status: AC

## 2018-05-27 MED FILL — BRAFTOVI 75 MG CAPSULE: 30 days supply | Qty: 180 | Fill #0 | Status: AC

## 2018-06-04 NOTE — Unmapped (Signed)
COVID-19 Screening Questions (to be completed on all patients that will come to [facility] for any of their appointments):    - Have you traveled outside of the state in the last two weeks?  No  - Have you come in close contact with any person(s) with confirmed COVID-19? No  - In the last two weeks have you had a fever greater than 100.17F?  No  - Do you have any new or worsening respiratory symptoms as far as: cough, SOB, sore throat or upper respiratory nasal congestion?  No  - Do you have any new or worsening loss of smell or taste, not related to chemo? No  - Do you have new onset of vomiting or diarrhea not related to another medical condition or chemotherapy? No    *If the patient answers yes to any of the above: Message sent to the Oncology Nurse Triage for further assessment of symptoms. Explained to pt that they will receive a follow-up call form a RN to further assess their symptoms.     *If the patient answers no to all of the above, proceed with screening:      Pt acknowledges visit type: Phone visit- at home and denies questions regarding visit.     Medication Reconciliation Completed? Yes    Oncology visitor restrictions: Patient informed that they are permitted to bring a visitor to their appointment, however if they feel as if they are able to attend the appointment safely by alone, they are encouraged to do so. Patient informed that if they do bring a visitor, it must be someone over the age of 76 and they will both be screened at the door prior to entering and will be given a mask if they are not wearing one.     BMT Clinic Visitor Policy: BMT patients are encouraged to continue bringing their 1 visitor over the age of 100 to their appointments to provide support. Pt is advised that the patient and visitor will both be screened upon arrival and will be given a mask if they are not already wearing one. Sharyn Blitz, CNA

## 2018-06-05 ENCOUNTER — Encounter: Admit: 2018-06-05 | Discharge: 2018-06-06 | Payer: PRIVATE HEALTH INSURANCE | Attending: Oncology | Primary: Oncology

## 2018-06-05 DIAGNOSIS — C439 Malignant melanoma of skin, unspecified: Principal | ICD-10-CM

## 2018-06-05 LAB — COMPREHENSIVE METABOLIC PANEL
A/G RATIO: 1.8 (ref 1.2–2.2)
ALBUMIN: 4.2 g/dL (ref 4.0–5.0)
ALKALINE PHOSPHATASE: 402 IU/L — ABNORMAL HIGH (ref 39–117)
ALT (SGPT): 94 IU/L — ABNORMAL HIGH (ref 0–44)
AST (SGOT): 67 IU/L — ABNORMAL HIGH (ref 0–40)
BILIRUBIN TOTAL: 0.5 mg/dL (ref 0.0–1.2)
BUN / CREAT RATIO: 13 (ref 9–20)
CALCIUM: 9.3 mg/dL (ref 8.7–10.2)
CHLORIDE: 106 mmol/L (ref 96–106)
CO2: 23 mmol/L (ref 20–29)
CREATININE: 1.29 mg/dL — ABNORMAL HIGH (ref 0.76–1.27)
GFR MDRD NON AF AMER: 67 mL/min/{1.73_m2}
GLOBULIN, TOTAL: 2.4 g/dL (ref 1.5–4.5)
GLUCOSE: 84 mg/dL (ref 65–99)
POTASSIUM: 4.3 mmol/L (ref 3.5–5.2)
TOTAL PROTEIN: 6.6 g/dL (ref 6.0–8.5)

## 2018-06-05 LAB — GFR MDRD NON AF AMER: Lab: 67

## 2018-06-09 NOTE — Unmapped (Signed)
Per 05/26 inbasket to scheduled lab/appt in 1 or 2 weeks in June. Left message for patient to return call and schedule appointments.

## 2018-06-09 NOTE — Unmapped (Signed)
Video Visit  Patient approved appointment change: yes  Patient confirmed streaming device: yes  Patient has mychart: yes  Patient demographics and insurance updated: yes  MSPQ complete: N/A  Instructions provided for video visit: yes

## 2018-06-09 NOTE — Unmapped (Signed)
Kenneth Fitzgerald contacted the [employer] requesting to speak with the care team of Kenneth Fitzgerald to discuss:    Patient called to schedule appointment with Dr. Nedra Hai.    Please contact [name] at [phone].    Program: Lung  Speciality: Medical Oncology    Check Indicates criteria has been reviewed and confirmed with the patient:    [x]  Preferred Name   [x]  DOB and/or MR#  [x]  Preferred Contact Method  [x]  Phone Number(s)   []  MyChart     Thank you,   Jacques Navy  [facility]   [phone]

## 2018-06-10 NOTE — Unmapped (Signed)
Clinical Pharmacist Practitioner: [facility]    Patient Name: Kenneth Fitzgerald  Patient Age: 43 y.o.  Encounter Date: 06/05/2018  Primary Oncologist: Kenneth Caprice, MD    Reason for call: oral targeted therapy follow-up    Assessment & Plan:   1. Metastatic melanoma: started Braftovi + [name] one week ago. Tolerating both medications with no significant toxicities at this time.   ?? Continue Braftovi 450 mg qhs + [name] 45 mg BID    2. 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 Labs yesterday show AST and ALT slowly improving.  ?? Repeat CMP at next visit    F/U: appt with Dr. Nedra Fitzgerald on 6/10    --------------------------------------------------------------    HPI: Kenneth Fitzgerald is a 43 y.o. male with metastatic melanoma who I am calling today for monitoring of Braftovi + [name].    Oral chemotherapy: Braftovi 450 mg qhs + [name] 45 mg BID   Start date: 05/29/18  Specialty pharmacy: [facility]  Copay: $0    Interim History: started targeted therapy one week ago. Reports feeling well and tolerating treatment. Experienced dizziness for a couple nights after starting pills but now resolved. Energy is lower but relates that to restarting work part-time on Monday. Denies rash, nausea, diarrhea, myalgias, vision changes.    Adherence: no issues  Drug-Drug Interactions: none    Oncology History:  -Recurrent stage IIIb melanoma. Completed high dose adjuvant interferon in Jan 2014.   -S/p thyroidectomy 06/2011 and radioactive iodine 03/2012 and again 05/2013. ??  -Thyroid CA recurrence 11/2012 s/p L neck dissecion 12/07/2012.  -2nd thyroid CA recurrence 03/2013 AND co-existing 1st melanoma recurrence 03/2013 s/p R cervical level 2-4 node dissection and partial parotidectomy  -S/p radiation to R neck, completed 06/2013  -Metastatic disease in abdominal/pelvic LN  -02/12/18 nivolumab q4w x 2 cycles complicated by colitis requiring 6 week steroid taper  -05/13/18 CT abdomen with progression in lymph nodes and omentum    Problem List:   Patient Active Problem List   Diagnosis   ??? Reactive depression (RAF-HCC)   ??? Malignant neoplasm of thyroid gland (CMS-HCC)   ??? Postoperative hypothyroidism   ??? Hypocalcemia   ??? Thyroid cancer (CMS-HCC)   ??? Melanoma (CMS-HCC)   ??? Ventral hernia with bowel obstruction       Medications:  Current Outpatient Medications   Medication Sig Dispense Refill   ??? binimetinib ([name]) 15 mg tablet Take 3 tablets (45 mg total) by mouth Two (2) times a day. 180 tablet 11   ??? calcitriol (ROCALTROL) 0.25 MCG capsule TAKE 1 CAPSULE BY MOUTH DAILY 90 capsule 1   ??? calcium carbonate (OS-CAL) 500 mg calcium (1,250 mg) chewable tablet Chew 2,000 mg of elem calcium daily.      ??? encorafenib (BRAFTOVI) 75 mg capsule Take 6 capsules (450 mg total) by mouth daily. 180 capsule 11   ??? fexofenadine (ALLEGRA ALLERGY) 180 MG tablet Take 180 mg by mouth.     ??? ibuprofen (ADVIL,MOTRIN) 200 MG tablet Take 200 mg by mouth every eight (8) hours as needed.      ??? levothyroxine (SYNTHROID) 125 MCG tablet TAKE (2 TABS) ON 5 DAYS/WEEK, AND TAKE 375 MCG (3 TABS) ON 2 DAYS/WEEK 192 tablet 3   ??? ondansetron (ZOFRAN) 8 MG tablet Take 1 tablet (8 mg total) by mouth every eight (8) hours as needed for nausea. (Patient not taking: Reported on 06/04/2018) 30 tablet 2     No current facility-administered medications for this visit.  Allergies:  Allergies   Allergen Reactions   ??? Compazine [Prochlorperazine] Itching   ??? Coconut Nausea And Vomiting       Personal and Social History:   Social History     Tobacco Use   ??? Smoking status: Never Smoker   ??? Smokeless tobacco: Never Used   Substance Use Topics   ??? Alcohol use: Yes     Comment: social drinker    He reports no history of drug use.    Family History:  His family history includes Arrhythmia in his mother; Colon cancer in his paternal grandmother; Coronary artery disease in his father; Diabetes in his father; Hypertension in his father; Hyperthyroidism in his mother; Osteoporosis in his mother; Prostate cancer in his father; Squamous cell carcinoma in his mother.    Review of Systems: A complete review of systems was obtained including: Constitutional, Eyes, ENT, Cardiovascular, Respiratory, GI, GU, Musculoskeletal, Skin, Neurological, Psychiatric, Endocrine, Heme/Lymphatic, and Allergic/Immunologic systems. All other systems reviewed and are negative to the patient???s management except for what was mentioned in the interim history.     Vital Signs: There were no vitals taken for this visit.    Laboratory Data:  No visits with results within 1 Day(s) from this visit.   Latest known visit with results is:   Telephone on 05/25/2018   Component Date Value   ??? Glucose 06/04/2018 84    ??? BUN 06/04/2018 17    ??? Creatinine 06/04/2018 1.29*   ??? GFR MDRD Non Af Amer 06/04/2018 67    ??? GFR MDRD Af Amer 06/04/2018 78    ??? BUN/Creatinine Ratio 06/04/2018 13    ??? Sodium 06/04/2018 145*   ??? Potassium 06/04/2018 4.3    ??? Chloride 06/04/2018 106    ??? CO2 06/04/2018 23    ??? Calcium 06/04/2018 9.3    ??? Total Protein 06/04/2018 6.6    ??? Albumin 06/04/2018 4.2    ??? Globulin, Total 06/04/2018 2.4    ??? A/G Ratio 06/04/2018 1.8    ??? Total Bilirubin 06/04/2018 0.5    ??? Alkaline Phosphatase 06/04/2018 402*   ??? AST 06/04/2018 67*   ??? ALT 06/04/2018 94*        Bobby Rumpf, PharmD, BCOP, CPP  Melanoma Clinical Pharmacist Practitioner  Pager: [phone]     I spent 20 minutes on the phone with the patient. I spent an additional 10 minutes on pre- and post-visit activities.     The patient was physically located in [place] or a state in which I am permitted to provide care. The patient and/or parent/gauardian understood that s/he may incur co-pays and cost sharing, and agreed to the telemedicine visit. The visit was completed via phone and/or video, which was appropriate and reasonable under the circumstances given the patient's presentation at the time.    The patient and/or parent/guardian has been advised of the potential risks and limitations of this mode of treatment (including, but not limited to, the absence of in-person examination) and has agreed to be treated using telemedicine. The patient's/patient's family's questions regarding telemedicine have been answered.     If the phone/video visit was completed in an ambulatory setting, the patient and/or parent/guardian has also been advised to contact their provider???s office for worsening conditions, and seek emergency medical treatment and/or call 911 if the patient deems either necessary.

## 2018-06-11 NOTE — Unmapped (Signed)
Pt would like to keep in-person app on 06/22/18 at 10:00AM with Dr. Lyda Perone due to needing lab work done. I did travel screening with pt.

## 2018-06-18 NOTE — Unmapped (Signed)
[facility] Clinical Assessment & Refill Coordination Note    Kenneth Fitzgerald [facility], DOB: 21-Sep-1975  Phone: [phone] (home)     All above HIPAA information was verified with patient.     Specialty Medication(s):   Hematology/Oncology: [name]     Current Outpatient Medications   Medication Sig Dispense Refill   ??? binimetinib ([name]) 15 mg tablet Take 3 tablets (45 mg total) by mouth Two (2) times a day. 180 tablet 11   ??? calcitriol (ROCALTROL) 0.25 MCG capsule TAKE 1 CAPSULE BY MOUTH DAILY 90 capsule 1   ??? calcium carbonate (OS-CAL) 500 mg calcium (1,250 mg) chewable tablet Chew 2,000 mg of elem calcium daily.      ??? encorafenib (BRAFTOVI) 75 mg capsule Take 6 capsules (450 mg total) by mouth daily. 180 capsule 11   ??? fexofenadine (ALLEGRA ALLERGY) 180 MG tablet Take 180 mg by mouth.     ??? ibuprofen (ADVIL,MOTRIN) 200 MG tablet Take 200 mg by mouth every eight (8) hours as needed.      ??? levothyroxine (SYNTHROID) 125 MCG tablet TAKE (2 TABS) ON 5 DAYS/WEEK, AND TAKE 375 MCG (3 TABS) ON 2 DAYS/WEEK 192 tablet 3   ??? ondansetron (ZOFRAN) 8 MG tablet Take 1 tablet (8 mg total) by mouth every eight (8) hours as needed for nausea. (Patient not taking: Reported on 06/04/2018) 30 tablet 2     No current facility-administered medications for this visit.         Changes to medications: Wray reports no changes at this time.    Allergies   Allergen Reactions   ??? Compazine [Prochlorperazine] Itching   ??? Coconut Nausea And Vomiting       Changes to allergies: No    SPECIALTY MEDICATION ADHERENCE     Braftovi 75 mg: 10 days of medicine on hand   [name] 15 mg: 10 days of medicine on hand       Medication Adherence    Patient reported X missed doses in the last month:  0  Specialty Medication:  braftovi  Patient is on additional specialty medications:  Yes  Additional Specialty Medications:  mektovi  Patient Reported Additional Medication X Missed Doses in the Last Month: 0  Patient is on more than two specialty medications:  No          Specialty medication(s) dose(s) confirmed: Regimen is correct and unchanged.     Are there any concerns with adherence? No    Adherence counseling provided? Not needed    CLINICAL MANAGEMENT AND INTERVENTION      Clinical Benefit Assessment:    Do you feel the medicine is effective or helping your condition? Yes    Clinical Benefit counseling provided? Not needed    Adverse Effects Assessment:    Are you experiencing any side effects? Patient experienced some dizziness initially but no other bothersome side effects.    Are you experiencing difficulty administering your medicine? No    Quality of Life Assessment:    How many days over the past month did your condition  keep you from your normal activities? For example, brushing your teeth or getting up in the morning. 0    Have you discussed this with your provider? Not needed    Therapy Appropriateness:    Is therapy appropriate? Yes, therapy is appropriate and should be continued    DISEASE/MEDICATION-SPECIFIC INFORMATION      N/A    PATIENT SPECIFIC NEEDS     ?  Does the patient have any physical, cognitive, or cultural barriers? No    ? Is the patient high risk? No     ? Does the patient require a Care Management Plan? No     ? Does the patient require physician intervention or other additional services (i.e. nutrition, smoking cessation, social work)? No      SHIPPING     Specialty Medication(s) to be Shipped:   Hematology/Oncology: [name] and [name]    Other medication(s) to be shipped: n/a     Changes to insurance: No    Delivery Scheduled: Yes, Expected medication delivery date: 06/23/18.     Medication will be delivered via UPS to the confirmed home address in [facility].    The patient will receive a drug information handout for each medication shipped and additional FDA Medication Guides as required.  Verified that patient has previously received a Conservation officer, historic buildings.    All of the patient's questions and concerns have been addressed.    Talea Manges  Anders Grant   [facility] Specialty Pharmacist

## 2018-06-19 NOTE — Unmapped (Signed)
Negative on the phone screening, patient informed on symptoms and visitor policy.

## 2018-06-20 NOTE — Unmapped (Signed)
Assessment/Plan:    1. T1N1b papillary thyroid cancer (classic/follicular variant), multifocal, s/p 3 surgeries and 2 doses of I-131. Last treatment was in 2015. Has indeterminate response to treatment based on persistent detectable Tg (< 1 on suppression, rose to 2.4 with stimulation in jan 2018).  Note that he also has stage IV melanoma, followed by oncology.  -Thyrogen-stimulated WBS was negative in 01/2016; stimulated Tg was 2.4  -Tg on suppression stable, last check 12/2017, repeat today.  -12/2017 [place] and neck CT did not suggest evidence of structural thyroid disease in the neck, likely repeat US ~ 12/2018, other scans being monitored by Onc for melanoma  -We have previously rev'd indications for additional imaging, stimulation testing, or intervention.    2. Hypothyroidism, with TSH fluctuating between 0.06 and 23 in 2017-2018 on same doses of thyroid hormone, perhaps relate to wt changes, absorption issues, and/or variable adherence  - TFTs within goal by last check, repeat TFTs today  - Reviewed appropriate administration, and reviewed factors that can affect dose needs for laboratory measurement    3. Hypocalcemia, severe, started calcitriol 05/2015. Low calcium in 12/2017 d/t low calcium intake at that time. Normal level in 05/2018.   -Last few levels have been steadily higher despite his self discontinuing calcitriol in the interim.  May be due to increased absorption as well as increased dietary/supplement intake.  -Check calcium level today along with labs below, advised about supplements thereafter.  May need to restart calcitriol.  Regardless, will have a low threshold for taking extra calcium and/or reporting symptoms.    4. Hyperglycemia, new in 03/2018, in the setting of 4 weeks of prednisone to treat a reaction to immunotherapy  -Issued prescription for meter, strips, lancets.  He will check blood glucose values periodically and rotating basis.  Reviewed goal parameters, will discuss in more detail after today's A1c is back, i.e. let him know if he needs to check more often or have an interim visit or start any new therapies.  -Reviewed dietary and exercise strategies to keep sugars in target range    Return 3 month for Video Visit.     Labs today:  Orders Placed This Encounter   Procedures   ??? Thyroglobulin Tumor Marker, Serum   ??? TSH   ??? T4, Free   ??? Calcium   ??? Phosphorus Level   ??? Albumin   ??? Creatinine   ??? Hemoglobin A1c               Reason For Visit:   Chief Complaint   Patient presents with   ??? Follow-up thyroid cancer and hypoparathyroidism      Subjective:     History of Present Illness:  Kenneth Fitzgerald is a 43 y.o. Caucasian male with PTC who was last seen 12/2017.      12/17/17 - VISIT - TSH 0.071, FT4 1.6, Ca 7.3; restart dietary Ca and if needed supplements, cont calcitriol 0.25/day, repeat Ca with HemOnc, cont same LT4; [place] negative; Tg stable at 0.3  02/04/18 - message from Dr. Nedra Hai that pt has Stage 4 melanoma and will be starting IT, should respond, asking if any coordination needed for thyroid cancer surveillance    Taking LT4 250 mcg x 5 days/week, and 375 mcg x 2 days/week. Good adherence. Takes in AM on empty stomach. No biotin supplements. No overt hypothyroid symptoms. No neck pain, swelling, globus, dysphagia, or dysphonia.    Calcium intake is milk and vit D/calcium supplement (most days).  Has not taken calcitriol 0.25 mcg/day for one month, no clear reason, forgets. No hypoCa symptoms. No kidney stones.     Had 2 infusions of immunotherapy but had to stop in 03/2018 due adverse effects including GI and MSK issues. Took prednisone for ~ 4 weeks to treat the reaction. Started targeted therapy (12 pills/day) ~ 2-3 weeks ago and seems to be tolerating well.  Labs are regularly monitored by oncology.    Weight trend:  Wt Readings from Last 6 Encounters:   06/22/18 (!) 140.6 kg (310 lb)   05/13/18 (!) 139.3 kg (307 lb 1.6 oz)   03/12/18 (!) 137.5 kg (303 lb 2.1 oz)   03/12/18 (!) 137.3 kg (302 lb 11.2 oz)   02/12/18 (!) 139.2 kg (306 lb 14.1 oz)   01/29/18 (!) 140.1 kg (308 lb 14.4 oz)       PMH:  1. 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 Thyroidectomy 06/25/2011 with 1 foci each lobe and 2/2 central LN (left Level 6)     1b. Thyrogen-based RAI 125 mCi 03/2012 with pre/post scans showing fairly large focus of bed activity and 4.2% uptake; pretx stimulated Tg of 73     1c. Recurrence on [place] 09/2012 in left thyroid bed and confirmed by FNA 10/2012; left ND in 11/2012 levels 3-6 with 12/16 positive (largest 1.8 cm, and some with extranodal extension)     1d. Right CND 03/2013 levels 2-4 with 3/20 positive (largest 3.5 mm), found incidentally as the right ND was performed for melanoma recurrence which was 1 cm node in 1 of 20 nodes     1e. Thyrogen-based 150 mCi in 05/2013 [LID as well before the treatment]      70f. Thyrogen-stimulated Tg was 2.4 in 01/2016. WBS in 01/2016 was negative.  2. Postsurgical hypothyroidism  3. Stage 3 melanoma - scalp lesion, surgery including RND, and systemic therapy ending 01/2012; recurrence in a 1 cm right LN removed 03/2013 on repeat RND; XRT completed 06/2013  4. Diverticulitis  5. H/o anklyosing spondylitis 2009     PSH reviewed in Epic      Current Outpatient Medications:   ???  binimetinib ([name]) 15 mg tablet, Take 3 tablets (45 mg total) by mouth Two (2) times a day., Disp: 180 tablet, Rfl: 11  ???  calcitriol (ROCALTROL) 0.25 MCG capsule, TAKE 1 CAPSULE BY MOUTH DAILY, Disp: 90 capsule, Rfl: 1  ???  calcium carbonate (OS-CAL) 500 mg calcium (1,250 mg) chewable tablet, Chew 2,000 mg of elem calcium daily. , Disp: , Rfl:   ???  encorafenib (BRAFTOVI) 75 mg capsule, Take 6 capsules (450 mg total) by mouth daily., Disp: 180 capsule, Rfl: 11  ???  fexofenadine (ALLEGRA ALLERGY) 180 MG tablet, Take 180 mg by mouth., Disp: , Rfl:   ???  ibuprofen (ADVIL,MOTRIN) 200 MG tablet, Take 200 mg by mouth every eight (8) hours as needed. , Disp: , Rfl:   ??? levothyroxine (SYNTHROID) 125 MCG tablet, TAKE (2 TABS) ON 5 DAYS/WEEK, AND TAKE 375 MCG (3 TABS) ON 2 DAYS/WEEK, Disp: 192 tablet, Rfl: 3  ???  ondansetron (ZOFRAN) 8 MG tablet, Take 1 tablet (8 mg total) by mouth every eight (8) hours as needed for nausea., Disp: 30 tablet, Rfl: 2  ???  blood sugar diagnostic Strp, Dispense 100 blood glucose test strips, ok to sub any brand preferred by insurance/patient, use 3x/day; dispense whatever brand matches with meter., Disp: 100 strip, Rfl: 12  ???  blood-glucose meter kit, Use as instructed; dispense 1 meter, whatever is preferred by insurance, Disp: 1 each, Rfl: 1  ???  lancets Misc, Dispense 100 lancets, ok to sub any brand preferred by insurance/patient, use 3x/day, Disp: 100 each, Rfl: 12      Allergies   Allergen Reactions   ??? Compazine [Prochlorperazine] Itching   ??? Coconut Nausea And Vomiting       ROS  No chest pain or SOB. No f/c. No significant swelling. Remainder of ten systems reviewed were negative except as noted in the history of present illness.    Social Hx:  --Living in [place].  -Since fall July 16, 2016, he is working at [employer], a Engineer, mining.  Drives a tow truck.  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 Hyperthyroidism Mother    ??? Osteoporosis Mother    ??? Arrhythmia Mother    ??? Squamous cell carcinoma Mother         basal cell vs squamous cell skin cancer   ??? Coronary artery disease Father         s/p CABG   ??? Diabetes Father    ??? Hypertension Father    ??? Prostate cancer Father    ??? Colon cancer Paternal Grandmother    ??? Thyroid disease Neg Hx    Father died 02-15-2017 with diabetes and heart disease  Mother died in 07/16/12    Objective:     Physical Exam:  BP 143/83  - Pulse 76  - Ht 188 cm (6' 2.02)  - Wt (!) 140.6 kg (310 lb)  - BMI 39.78 kg/m??      General appearance - Conversant, pleasant, in no distress.  Generalized and abdominal obesity.  Wearing a mask.  Eyes -  No lid lag or stare, no proptosis  Neck - Supple w/o palpable LAD, no masses appreciated. Well healed incision from thyx.and previous LND.   Lymphatics - no cervical or supraclavicular adenopathy appreciated   Resp - clear to auscultation bilaterally  CV - normal rate, regular rhythm  Neurological - no hand tremors, 2+ upper extremity DTRs  Extremities - peripheral pulses normal, no lower extremity edema  Skin - warm, dry, no visible rashes.  No acanthosis.    Data Review:    TSH   Date Value   05/13/2018 0.143 uIU/mL (L)   03/12/2018 0.020 uIU/mL (L)   02/12/2018 0.033 uIU/mL (L)   10/04/2013 0.11 u[iU]/mL (L)   06/28/2013 0.28 u[iU]/mL (L)   04/27/2013 0.09 u[iU]/mL (L)     Free T4 (ng/dL)   Date Value   16/10/9602 2.05 (H)   03/12/2018 1.71 (H)   02/12/2018 1.65 (H)   10/04/2013 1.55 (H)   06/28/2013 1.40   04/27/2013 1.69 (H)     THYROGLOBULIN AB (IU/mL)   Date Value   12/17/2017 <1.8   02/27/2017 <1.8   01/26/2016 <1.8     Thyroglobulin, Tumor Marker, IA (ng/mL)   Date Value   12/17/2017 0.3 (H)   02/27/2017 0.4 (H)   01/26/2016 2.4 (H)      Lab Results   Component Value Date    CALCIUM 9.3 06/04/2018    CALCIUM 8.4 (L) 05/19/2018    CALCIUM 7.8 (L) 05/13/2018    PHOS 4.7 02/27/2017    PHOS 5.2 (H) 07/03/2016    PHOS 6.0 (H) 06/25/2016    CREATININE 1.29 (  H) 06/04/2018    CREATININE 1.03 05/19/2018    CREATININE 0.88 05/13/2018    VITDTOTAL 31.0 02/27/2017    VITDTOTAL 37 12/04/2015    VITDTOTAL 34 12/12/2014    PTH 9.9 (L) 12/04/2015    PTH 12.4 12/12/2014    PTH 12 04/27/2013     Lab Results   Component Value Date    A1C 5.2 09/22/2012     Calcium values while hospitalized in 06/2016 were low at less than 7, and had low ionized calcium as well.     01/2016 Thyrogen stimulated testing  - Tg rose to 2.4.    PATH from 03/2013 right neck dissection:  Amended: 03/31/2013 by Kirkland Hun, MD  Reason: Additional Information  Comment: The report is amended to add the results of a BRAF V600E immunostain.  The final diagnosis is unchanged.  Previous Signout Date: 03/30/2013  Diagnosis:  Lymph nodes, right cervical, levels 2-4, removal and partial parotidectomy  -Metastatic melanoma involving 1 out of 20 lymph nodes, with the largest diameter measuring 10 mm (1.0 cm) and no evidence of extracapsular extension (1/20)  -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, with the largest diameter measuring 3.5 mm and no evidence of extracapsular extension (3/20)   -BRAF V600E immunohistochemical staining is positive (3+ staining, 100% of cells) --> this was on LN with melanoma  -Benign parotid gland is also present  *Molecular testing:  RESULTS:  Gene Variants of Known Clinical Utility:   BRAF: No mutation detected (see note) --> A BRAF V600E immunohistochemical stain was performed on the tissue section of this case and was positive (see case [id number]). Though within validated parameters, the tumor input for sequencing in this case was low. This could result in the V600E mutant allele frequency falling below the limit of detection for the assay. [Again on LN with melanoma]  KIT: No mutation detected     Radiology:    04/2018???had CT chest with contrast and echocardiogram  02/2018???had CT abdomen pelvis with contrast and MRI brain with contrast    Results for orders placed during the hospital encounter of 12/17/17   US Soft Tissue Head And Neck    Narrative EXAM: US SOFT TISSUE HEAD AND NECK  DATE: 12/17/2017 10:06 AM  ACCESSION: [id number]  DICTATED: 12/17/2017 10:07 AM  INTERPRETATION LOCATION: [facility]    CLINICAL INDICATION: 43 year old Male with thyroid cancer  - C73 - Thyroid cancer (CMS - HCC)     COMPARISON: Thyroid ultrasound from 01/01/2017    TECHNIQUE:  Ultrasound views of the thyroid were obtained using gray scale and limited color Doppler imaging.    FINDINGS:  Status post total thyroidectomy. No residual thyroid tissue is visualized in the thyroidectomy bed. Heterogeneous hypoechoic nodule in the midline, superior to the thyroidectomy bed is again noted measuring 1.1 x 1.0 x 0.6 cm. There has been apparent interval increase in the soft tissue component of this nodule.    Few subcentimeter lymph nodes in bilateral neck is identified with largest measuring 0.4 cm in left level II, previously 0.9 cm.        Impression - Status post total thyroidectomy. Unchanged size of 1.1 cm a heterogeneous nodule in the midline superior to the thyroidectomy bed, although with slightly increased soft tissue component. Short interval follow-up or FNA could be considered.    - Interval decrease in size of subcentimeter lymph nodes in bilateral neck with largest measuring 0.4 cm in left level II,  previously 0.9 cm.       01/2016 Whole body scan:  FINDINGS: ??There are no abnormal foci of increased radiotracer uptake identified. ??There is physiologic uptake seen in the nasopharynx, the bowel, stomach, and bladder.  Impression  No evidence of I-131 avid metastasis.    05/2013 had stimulated PET and WBS, as well as 1 week post treatment WBS     Other Medical Data:    Reviewed interim relevant HemOnc notes and direct communications from them. Reviewed and summarized relevant interim labs and studies for today's visit.

## 2018-06-22 ENCOUNTER — Ambulatory Visit: Admit: 2018-06-22 | Discharge: 2018-06-22 | Payer: PRIVATE HEALTH INSURANCE

## 2018-06-22 ENCOUNTER — Encounter: Admit: 2018-06-22 | Discharge: 2018-06-22 | Payer: PRIVATE HEALTH INSURANCE

## 2018-06-22 DIAGNOSIS — R739 Hyperglycemia, unspecified: Secondary | ICD-10-CM

## 2018-06-22 DIAGNOSIS — C73 Malignant neoplasm of thyroid gland: Principal | ICD-10-CM

## 2018-06-22 DIAGNOSIS — C439 Malignant melanoma of skin, unspecified: Secondary | ICD-10-CM

## 2018-06-22 DIAGNOSIS — E890 Postprocedural hypothyroidism: Secondary | ICD-10-CM

## 2018-06-22 LAB — COMPREHENSIVE METABOLIC PANEL
ALBUMIN: 3.7 g/dL (ref 3.5–5.0)
ALKALINE PHOSPHATASE: 92 U/L (ref 38–126)
ALT (SGPT): 34 U/L (ref <50)
ANION GAP: 14 mmol/L (ref 7–15)
AST (SGOT): 41 U/L (ref 19–55)
BILIRUBIN TOTAL: 0.3 mg/dL (ref 0.0–1.2)
BLOOD UREA NITROGEN: 11 mg/dL (ref 7–21)
BUN / CREAT RATIO: 12
CALCIUM: 8.8 mg/dL (ref 8.5–10.2)
CHLORIDE: 105 mmol/L (ref 98–107)
CO2: 22 mmol/L (ref 22.0–30.0)
CREATININE: 0.93 mg/dL (ref 0.70–1.30)
EGFR CKD-EPI AA MALE: >90 mL/min/{1.73_m2} (ref >=60)
EGFR CKD-EPI NON-AA MALE: >90 mL/min/{1.73_m2} (ref >=60)
GLUCOSE RANDOM: 117 mg/dL (ref 70–179)
POTASSIUM: 4.1 mmol/L (ref 3.5–5.0)
PROTEIN TOTAL: 6.6 g/dL (ref 6.5–8.3)
SODIUM: 141 mmol/L (ref 135–145)

## 2018-06-22 LAB — EGFR CKD-EPI AA MALE: Lab: >90

## 2018-06-22 LAB — CBC W/ AUTO DIFF
BASOPHILS ABSOLUTE COUNT: 0.1 10*9/L (ref 0.0–0.1)
BASOPHILS RELATIVE PERCENT: 1 %
EOSINOPHILS ABSOLUTE COUNT: 0.4 10*9/L (ref 0.0–0.4)
HEMATOCRIT: 43.6 % (ref 41.0–53.0)
HEMOGLOBIN: 13.8 g/dL (ref 13.5–17.5)
LARGE UNSTAINED CELLS: 1 % (ref 0–4)
LYMPHOCYTES ABSOLUTE COUNT: 1.1 10*9/L — ABNORMAL LOW (ref 1.5–5.0)
LYMPHOCYTES RELATIVE PERCENT: 14.5 %
MEAN CORPUSCULAR HEMOGLOBIN CONC: 31.6 g/dL (ref 31.0–37.0)
MEAN CORPUSCULAR HEMOGLOBIN: 25.6 pg — ABNORMAL LOW (ref 26.0–34.0)
MEAN CORPUSCULAR VOLUME: 81.1 fL (ref 80.0–100.0)
MEAN PLATELET VOLUME: 11.9 fL — ABNORMAL HIGH (ref 7.0–10.0)
MONOCYTES ABSOLUTE COUNT: 0.5 10*9/L (ref 0.2–0.8)
MONOCYTES RELATIVE PERCENT: 6.2 %
NEUTROPHILS ABSOLUTE COUNT: 5.3 10*9/L (ref 2.0–7.5)
NEUTROPHILS RELATIVE PERCENT: 72.1 %
PLATELET COUNT: 226 10*9/L (ref 150–440)
RED BLOOD CELL COUNT: 5.37 10*12/L (ref 4.50–5.90)

## 2018-06-22 LAB — FREE T4: Thyroxine.free:MCnc:Pt:Ser/Plas:Qn:: 1.18

## 2018-06-22 LAB — THYROID STIMULATING HORMONE: Thyrotropin:ACnc:Pt:Ser/Plas:Qn:: 0.352 — ABNORMAL LOW

## 2018-06-22 LAB — ESTIMATED AVERAGE GLUCOSE: Estimated average glucose:MCnc:Pt:Bld:Qn:Estimated from glycated hemoglobin: 126

## 2018-06-22 LAB — PHOSPHORUS: Phosphate:MCnc:Pt:Ser/Plas:Qn:: 5 — ABNORMAL HIGH

## 2018-06-22 LAB — LYMPHOCYTES RELATIVE PERCENT: Lab: 14.5

## 2018-06-22 MED ORDER — BLOOD SUGAR DIAGNOSTIC STRIPS
ORAL_STRIP | 12 refills | 0 days | Status: CP
Start: 2018-06-22 — End: ?

## 2018-06-22 MED ORDER — BLOOD-GLUCOSE METER KIT WRAPPER
1 refills | 0 days | Status: CP
Start: 2018-06-22 — End: ?

## 2018-06-22 MED ORDER — LANCETS
12 refills | 0 days | Status: CP
Start: 2018-06-22 — End: ?

## 2018-06-22 MED FILL — BRAFTOVI 75 MG CAPSULE: ORAL | 30 days supply | Qty: 180 | Fill #1

## 2018-06-22 MED FILL — BRAFTOVI 75 MG CAPSULE: 30 days supply | Qty: 180 | Fill #1 | Status: AC

## 2018-06-22 MED FILL — MEKTOVI 15 MG TABLET: 30 days supply | Qty: 180 | Fill #1 | Status: AC

## 2018-06-22 MED FILL — MEKTOVI 15 MG TABLET: ORAL | 30 days supply | Qty: 180 | Fill #1

## 2018-06-24 ENCOUNTER — Encounter
Admit: 2018-06-24 | Discharge: 2018-06-25 | Payer: PRIVATE HEALTH INSURANCE | Attending: Hematology & Oncology | Primary: Hematology & Oncology

## 2018-06-24 DIAGNOSIS — F418 Other specified anxiety disorders: Secondary | ICD-10-CM

## 2018-06-24 DIAGNOSIS — C439 Malignant melanoma of skin, unspecified: Principal | ICD-10-CM

## 2018-06-24 LAB — THYROGLOBULIN AB: Thyroglobulin Ab:ACnc:Pt:Ser/Plas:Qn:IA: <1.8

## 2018-06-24 LAB — REFLEX - THYROGLOBULIN, TUMOR MARKER BY IMMUNOASSAY: THYROGLOBULIN, TUMOR MARKER, IA: 0.2 ng/mL — ABNORMAL HIGH

## 2018-06-24 LAB — THYROGLOBULIN, IA, INTERP

## 2018-06-24 NOTE — Unmapped (Signed)
Return to clinic in 4-6 weeks for labs, CT scans and brain MRI.       For appointments & questions Monday through Friday 8 AM???5 PM     Please call [phone] or Toll free [phone]    For urgent clinical needs on Nights, Weekends or Holidays  Call [phone] and ask for the oncologist on call.     For appointment changes please contact during normal business hours.     Please visit [email or web address], a resource created just for family members and caregivers.  This website lists support services, how and where to ask for help. It has tools to assist you as you help [place] care for your loved one.    [facility]  [address]  [place], [place] [address]  [email or web address]

## 2018-06-24 NOTE — Unmapped (Signed)
I spent 25 minutes on the phone with the patient. I spent an additional 10 minutes on pre- and post-visit activities.     The patient was physically located in [place] or a state in which I am permitted to provide care. The patient understood that s/he may incur co-pays and cost sharing, and agreed to the telemedicine visit. The visit was completed via phone and/or video, which was appropriate and reasonable under the circumstances given the patient's presentation at the time.    The patient has been advised of the potential risks and limitations of this mode of treatment (including, but not limited to, the absence of in-person examination) and has agreed to be treated using telemedicine. The patient's/patient's family's questions regarding telemedicine have been answered.     If the phone/video visit was completed in an ambulatory setting, the patient has also been advised to contact their provider???s office for worsening conditions, and seek emergency medical treatment and/or call 911 if the patient deems either necessary.      PRIMARY CARE PHYSICIAN  TIMOTHY R DRAPER, DO  [id number]  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   S/p thyroidectomy 06/2011 and radioactive iodine 03/2012 and again 05/2013.    Thyroid CA recurrence 11/2012 s/p L neck dissecion 12/07/2012.  2nd thyroid CA recurrence 03/2013 AND co-existing 1st melanoma recurrence 03/2013 s/p R cervical level 2-4 node dissection and partial parotidectomy  S/p radiation to R neck, completed 06/2013    CURRENT THERAPY:   Nivolumab (02/12/18- )    ASSESSMENT: [name] is a pleasant 43 y.o. male who presents today for further evaluation of recurrent stage IIIb melanoma.  He completed adjuvant high dose IFN in Jan 2014. Since then, thyroid CA has recurred twice and melanoma once. Most recently s/p cervical neck dissection and adjuvant XRT. Now w/metastatic disease in abdominal/pelvic LN.     PLAN  1. Stage IV melanoma: BRAF+ by IHC. Dr Eyvonne Left reviewed path: TILS present, non-brisk; lymphocytes are 20 to 30% the number of melanoma cells.  Known sites of disease include abdominal/pelvic LN.  Brain indeterminate.   Start dabrafenib/trametinib mid-May.     - 2 cycles of nivolumab was complicated by colitis that required a 6 week steroid taper.   - CT scans and brain MRI reviewed; CT abdomen w/progression in lymph nodes and omentum.  - Continue BRAFtovi/[name]; labs and baseline echocardiogram reviewed and WNL.   - RTC in 4-6 weeks w/labs, CT scans, brain MRI and in person.      2. Brain MRI; indeterminate.  D/w Dr. Despina Hick.  No local treatment recommended at this time.  Brain MRI has been officially reviewed and is stable, but is concerning given possible progression elsewhere.     3. Thyroid CA, recurrent. Well controlled now. Followed by endocrinology for thyroid and Ca+ replacement and ongoing monitoring of glucose.    4. Mood; excessive stress related to h/o 2 malignancies, job loss and legal issues surrounding. Notes that he sometimes feels hopeless, but doesn't feel suicidal or have plans to harm himself.  He agrees to d/w Dr. Willaim Bane.     5. Work in the time of COVID. He is back to work part time and plans to move to full time.  He can wear a mask when around other co workers and practice social distancing, good handwashing.  Though his risk of COVID complications is greater than general public, work is necessary both for financial and mental health reasons.            INTERVAL HISTORY: This is a return visit to the [facility] for further evaluation of melanoma.     --started BRAF/MEK 2-3 weeks ago.   --notes some mild dizziness when first wakes up in the am.  This resolves w/in 30 min of getting up and out of bed.  One episode lasted longer and was exacerbated   --frequent urination, not dark in color.  Endocrinologist ordered HgbA1c which was 6; he will start monitoring his glucose.   --no fevers, chills  --no rashes or other skin changes.   --energy is fair, he is working part time now, but up to 43hrs/week.  This may be why his energy is lower, but it is improving.   --No N/V  --lots of stress in his personal life w/r/t previous legal issues and ongoing financial strain.  He agrees to reach out to [facility].           PAST MEDICAL HISTORY:   1. Diverticulitis.   2. History of ankylosing spondylitis in 2009. Please see past   medical history dictated on 11/15/2010 for details. In brief, he was treated w/Enbrel and prednisone for 6mos or so.  Symptoms improved, immunosuppressants were stopped and symptoms did not recur.   3. S/p incisional hernia repair Dec 2016      SOCIAL HISTORY:    reports that he has never smoked. He has never used smokeless tobacco. He reports current alcohol use. He reports that he does not use drugs.      MEDICATIONS:   Current Outpatient Medications   Medication Sig Dispense Refill   ??? binimetinib ([name]) 15 mg tablet Take 3 tablets (45 mg total) by mouth Two (2) times a day. 180 tablet 11   ??? blood sugar diagnostic Strp Dispense 100 blood glucose test strips, ok to sub any brand preferred by insurance/patient, use 3x/day; dispense whatever brand matches with meter. 100 strip 12   ??? blood-glucose meter kit Use as instructed; dispense 1 meter, whatever is preferred by insurance 1 each 1   ??? calcitriol (ROCALTROL) 0.25 MCG capsule TAKE 1 CAPSULE BY MOUTH DAILY 90 capsule 1   ??? calcium carbonate (OS-CAL) 500 mg calcium (1,250 mg) chewable tablet Chew 2,000 mg of elem calcium daily.      ??? encorafenib (BRAFTOVI) 75 mg capsule Take 6 capsules (450 mg total) by mouth daily. 180 capsule 11   ??? fexofenadine (ALLEGRA ALLERGY) 180 MG tablet Take 180 mg by mouth daily as needed.      ??? ibuprofen (ADVIL,MOTRIN) 200 MG tablet Take 200 mg by mouth every eight (8) hours as needed.      ??? lancets Misc Dispense 100 lancets, ok to sub any brand preferred by insurance/patient, use 3x/day 100 each 12   ??? levothyroxine (SYNTHROID) 125 MCG tablet TAKE (2 TABS) ON 5 DAYS/WEEK, AND TAKE 375 MCG (3 TABS) ON 2 DAYS/WEEK 192 tablet 3   ??? ondansetron (ZOFRAN) 8 MG tablet Take 1 tablet (8 mg total) by mouth every eight (8) hours as needed for nausea. 30 tablet 2     No current facility-administered medications for this visit.        REVIEW OF SYSTEMS  A complete  review of systems was obtained and was negative except for those symptoms listed in the HPI     PHYSICAL EXAM  There were no vitals filed for this visit. This was a phone visit from home.    GEN: Awake and alert, pleasant appearing male in no acute distress  LUNGS: normal work of breathing.   SKIN: No rashes, petechiae or jaundice noted.  PYSCH: Alert and oriented to person, place and time  EXT: No edema noted of the lower extremity noted.      LABS  Lab Results   Component Value Date    WBC 7.3 06/22/2018    HGB 13.8 06/22/2018    HCT 43.6 06/22/2018    PLT 226 06/22/2018       Lab Results   Component Value Date    NA 141 06/22/2018    K 4.1 06/22/2018    CL 105 06/22/2018    CO2 22.0 06/22/2018    BUN 11 06/22/2018    CREATININE 0.93 06/22/2018    GLU 117 06/22/2018    CALCIUM 8.8 06/22/2018    MG 1.6 06/25/2016    PHOS 5.0 (H) 06/22/2018       Lab Results   Component Value Date    BILITOT 0.3 06/22/2018    BILIDIR 0.1 08/22/2011    PROT 6.6 06/22/2018    ALBUMIN 3.7 06/22/2018    ALT 34 06/22/2018    AST 41 06/22/2018    ALKPHOS 92 06/22/2018    GGT 42 08/22/2011       Lab Results   Component Value Date    INR 1.18 01/04/2015       RADIOLOGY RESULTS;  CT chest 05/13/2018:  IMPRESSION:  - Unchanged mediastinal lymphadenopathy. No definite new sites of intrathoracic metastatic disease within the chest.   -Interval patchy peribronchial groundglass opacities, likely of infectious versus inflammatory etiology.    CT abd: IMPRESSION:  Multiple enlarged lymph nodes within the abdomen and pelvis, some of which are new since prior. Additionally there is interval development of omental nodularity.  ??  New perivascular hypoattenuation (right > left) within the liver in a pattern atypical for melanotic metastases. This may represent perivascular steatosis, but the new narrowing of the portal vasculature suggests associated regional inflammation or mass effect. Consider contrast-enhanced abdominal MRI for further evaluation.    MRI brain 05/13/2018:   IMPRESSION:  No new lesions. Unchanged punctate T1 hyperintensities as described previously, which could represent metastatic lesions.      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  B: Lymph node, pelvic, core needle biopsy  - Metastatic melanoma  - See comment  ??     FNA R femur 07/19/14:  : Bone, right femur, core biopsy with touch imprints  - No primary or metastatic malignancy identified  - Bone with focal osteocyte dropout, suggestive of necrosis  - Bone marrow with trilineage hematopoiesis and focal effacement by chronic inflammation (see comment)    B: Bone, right femur, fine needle aspiration  - Few atypical cells present on direct smears (see comment)  - Scant fragments of edematous stromal tissue in cell block  - Bone marrow with trilineage hematopoiesis    Diagnosis:  Lymph nodes, right cervical, levels 2-4, removal and partial parotidectomy  -  Metastatic melanoma involving 1 out of 20 lymph nodes, with the largest  diameter measuring 10 mm (1.0 cm) and no evidence of extracapsular extension  (1/20)  -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

## 2018-06-26 NOTE — Unmapped (Signed)
Outpatient Sw Note - Telephone Call    Sw call patient and explained that the [employer] currently has a grant for food and nutritional needs.  Patient did not answer so Sw left message with number where he can return the call.      [name], MSW, [name]

## 2018-07-06 NOTE — Unmapped (Signed)
Notified patient of upcoming appts.

## 2018-07-10 ENCOUNTER — Encounter
Admit: 2018-07-10 | Discharge: 2018-07-11 | Payer: PRIVATE HEALTH INSURANCE | Attending: Psychiatry | Primary: Psychiatry

## 2018-07-10 DIAGNOSIS — F418 Other specified anxiety disorders: Principal | ICD-10-CM

## 2018-07-15 NOTE — Unmapped (Signed)
[facility] Refill Coordination Note    Specialty Medication(s) to be Shipped:   Hematology/Oncology: Braftovi 75 mg and [name] 15 mg       Kenneth Fitzgerald, DOB: 02-18-1975  Phone: [phone] (home)       All above HIPAA information was verified with patient.     Completed refill call assessment today to schedule patient's medication shipment from the [facility] [phone]).       Specialty medication(s) and dose(s) confirmed: Regimen is correct and unchanged.   Changes to medications: Kenneth Fitzgerald reports no changes at this time.  Changes to insurance: No  Questions for the pharmacist: No    Confirmed patient received Welcome Packet with first shipment. The patient will receive a drug information handout for each medication shipped and additional FDA Medication Guides as required.       DISEASE/MEDICATION-SPECIFIC INFORMATION        N/A    SPECIALTY MEDICATION ADHERENCE     Medication Adherence    Patient reported X missed doses in the last month:  0  Specialty Medication:  Braftovi 75 mg  Patient is on additional specialty medications:  Yes  Additional Specialty Medications:  [name] 15 mg  Patient Reported Additional Medication X Missed Doses in the Last Month:  0  Patient is on more than two specialty medications:  No  Any gaps in refill history greater than 2 weeks in the last 3 months:  no  Demonstrates understanding of importance of adherence:  yes  Informant:  patient  Reliability of informant:  reliable  Provider-estimated medication adherence level:  good  Confirmed plan for next specialty medication refill:  delivery by pharmacy          Refill Coordination    Has the Patients' Contact Information Changed:  No  Is the Shipping Address Different:  No           Braftovi 75  mg: 9 days of medicine on hand   [name] 15 mg : 9 days of medicine on hand        SHIPPING     Shipping address confirmed in Epic.     Delivery Scheduled: Yes, Expected medication delivery date: 07/22/2018.     Medication will be delivered via UPS to the home address in Epic WAM.    Kenneth Fitzgerald   [facility] Specialty Technician

## 2018-07-20 MED FILL — BRAFTOVI 75 MG CAPSULE: 30 days supply | Qty: 180 | Fill #2 | Status: AC

## 2018-07-20 MED FILL — MEKTOVI 15 MG TABLET: ORAL | 30 days supply | Qty: 180 | Fill #2

## 2018-07-20 MED FILL — BRAFTOVI 75 MG CAPSULE: ORAL | 30 days supply | Qty: 180 | Fill #2

## 2018-07-20 MED FILL — MEKTOVI 15 MG TABLET: 30 days supply | Qty: 180 | Fill #2 | Status: AC

## 2018-07-28 MED ORDER — LEVOTHYROXINE 125 MCG TABLET
ORAL_TABLET | 3 refills | 0 days | Status: CP
Start: 2018-07-28 — End: 2018-08-28

## 2018-07-29 ENCOUNTER — Encounter
Admit: 2018-07-29 | Discharge: 2018-07-29 | Payer: PRIVATE HEALTH INSURANCE | Attending: Hematology & Oncology | Primary: Hematology & Oncology

## 2018-07-29 ENCOUNTER — Encounter: Admit: 2018-07-29 | Discharge: 2018-07-29 | Payer: PRIVATE HEALTH INSURANCE

## 2018-07-29 DIAGNOSIS — C439 Malignant melanoma of skin, unspecified: Principal | ICD-10-CM

## 2018-07-29 LAB — COMPREHENSIVE METABOLIC PANEL
ALBUMIN: 3.8 g/dL (ref 3.5–5.0)
ALKALINE PHOSPHATASE: 60 U/L (ref 38–126)
ALT (SGPT): 19 U/L (ref <50)
ANION GAP: 14 mmol/L (ref 7–15)
BILIRUBIN TOTAL: 0.4 mg/dL (ref 0.0–1.2)
BLOOD UREA NITROGEN: 14 mg/dL (ref 7–21)
BUN / CREAT RATIO: 12
CALCIUM: 6.9 mg/dL — ABNORMAL LOW (ref 8.5–10.2)
CHLORIDE: 102 mmol/L (ref 98–107)
CO2: 23 mmol/L (ref 22.0–30.0)
CREATININE: 1.13 mg/dL (ref 0.70–1.30)
EGFR CKD-EPI NON-AA MALE: 79 mL/min/{1.73_m2} (ref >=60)
GLUCOSE RANDOM: 129 mg/dL (ref 70–179)
PROTEIN TOTAL: 6.5 g/dL (ref 6.5–8.3)
SODIUM: 139 mmol/L (ref 135–145)

## 2018-07-29 LAB — CBC W/ AUTO DIFF
BASOPHILS ABSOLUTE COUNT: 0 10*9/L (ref 0.0–0.1)
BASOPHILS RELATIVE PERCENT: 0.4 %
EOSINOPHILS ABSOLUTE COUNT: 0.4 10*9/L (ref 0.0–0.4)
HEMATOCRIT: 40.1 % — ABNORMAL LOW (ref 41.0–53.0)
HEMOGLOBIN: 12.7 g/dL — ABNORMAL LOW (ref 13.5–17.5)
LARGE UNSTAINED CELLS: 1 % (ref 0–4)
LYMPHOCYTES ABSOLUTE COUNT: 1.5 10*9/L (ref 1.5–5.0)
LYMPHOCYTES RELATIVE PERCENT: 16 %
MEAN CORPUSCULAR HEMOGLOBIN CONC: 31.6 g/dL (ref 31.0–37.0)
MEAN CORPUSCULAR HEMOGLOBIN: 25.7 pg — ABNORMAL LOW (ref 26.0–34.0)
MEAN CORPUSCULAR VOLUME: 81.5 fL (ref 80.0–100.0)
MEAN PLATELET VOLUME: 7.1 fL (ref 7.0–10.0)
MONOCYTES ABSOLUTE COUNT: 0.3 10*9/L (ref 0.2–0.8)
MONOCYTES RELATIVE PERCENT: 3.6 %
NEUTROPHILS ABSOLUTE COUNT: 6.8 10*9/L (ref 2.0–7.5)
NEUTROPHILS RELATIVE PERCENT: 74 %
PLATELET COUNT: 271 10*9/L (ref 150–440)
RED BLOOD CELL COUNT: 4.92 10*12/L (ref 4.50–5.90)
RED CELL DISTRIBUTION WIDTH: 16.9 % — ABNORMAL HIGH (ref 12.0–15.0)
WBC ADJUSTED: 9.1 10*9/L (ref 4.5–11.0)

## 2018-07-29 LAB — AST (SGOT): Aspartate aminotransferase:CCnc:Pt:Ser/Plas:Qn:: 27

## 2018-07-29 LAB — ANISOCYTOSIS

## 2018-07-29 LAB — LACTATE DEHYDROGENASE: Lactate dehydrogenase:CCnc:Pt:Ser/Plas:Qn:: 544

## 2018-07-29 NOTE — Unmapped (Signed)
See [place] back in 3mos w/CT scans and labs.     For appointments & questions Monday through Friday 8 AM???5 PM     Please call [phone] or Toll free [phone]    For urgent clinical needs on Nights, Weekends or Holidays  Call [phone] and ask for the oncologist on call.     For appointment changes please contact during normal business hours.     Please visit PrivacyFever.cz, a resource created just for family members and caregivers.  This website lists support services, how and where to ask for help. It has tools to assist you as you help [place] care for your loved one.    [facility]  [address]  [place], [place] [address]  [email or web address]

## 2018-07-29 NOTE — Unmapped (Signed)
Labs collected from pre-existing PIV.  Line flushed & saline locked.  PIV removed.  To next appt.

## 2018-07-29 NOTE — Unmapped (Signed)
PRIMARY CARE PHYSICIAN  TIMOTHY R DRAPER, DO  [address]  [place] [address]    CONSULTING PHYSICIANS  Ralene Cork, Do  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   S/p thyroidectomy 06/2011 and radioactive iodine 03/2012 and again 05/2013.    Thyroid CA recurrence 11/2012 s/p L neck dissecion 12/07/2012.  2nd thyroid CA recurrence 03/2013 AND co-existing 1st melanoma recurrence 03/2013 s/p R cervical level 2-4 node dissection and partial parotidectomy  S/p radiation to R neck, completed 06/2013  Nivolumab (02/12/18- )    CURRENT THERAPY:   BRAF/MEK started early May 2020    ASSESSMENT: [name] is a pleasant 43 y.o. male who presents today for further evaluation of recurrent stage IIIb melanoma.  He completed adjuvant high dose IFN in Jan 2014. Since then, thyroid CA has recurred twice and melanoma once. Most recently s/p cervical neck dissection and adjuvant XRT. Now w/metastatic disease in abdominal/pelvic LN.     PLAN  1. Stage IV melanoma: BRAF+ by IHC. Dr Eyvonne Left reviewed path: TILS present, non-brisk; lymphocytes are 20 to 30% the number of melanoma cells.  Known sites of disease include abdominal/pelvic LN.  Brain indeterminate.   Start dabrafenib/trametinib mid-May.     - 2 cycles of nivolumab was complicated by colitis that required a 6 week steroid taper.   - CT scans and brain MRI reviewed; disease in chest is stable, MRI of abdomen indicates slight reduction in disease burden BUT new portal vein clot.  - Continue BRAFtovi/[name]; labs and baseline echocardiogram reviewed and WNL.   - RTC in 3mos w/CT chest, Abd MRI.     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  D/w pharmacy.  Will treat w/Eliquis.  Will also order abdominal MRI as suggested for further eva.     2. Brain MRI; indeterminate and stable.  D/w Dr. Despina Hick.  No local treatment recommended at this time.      3. Thyroid CA, recurrent. Well controlled now. Followed by endocrinology for thyroid and Ca+ replacement and ongoing monitoring of glucose.    4. Mood; excessive stress related to h/o 2 malignancies, job loss and legal issues surrounding. Notes that he sometimes feels hopeless, but doesn't feel suicidal or have plans to harm himself.  He follows w/ Dr. Willaim Bane.     5. Work in the time of COVID. He is back to work part time and plans to move to full time.  He can wear a mask when around other co workers and practice social distancing, good handwashing.  Though his risk of COVID complications is greater than general public, work is necessary both for financial and mental health reasons.      #.) HypoCA; he wil increase Ca supplementation          INTERVAL HISTORY: This is a return visit to the [facility] for further evaluation of melanoma.     --started BRAF/MEK May 2020  --biggest complaint is fagitue.   --still working full time; 45hrs/week.  --notes some mild dizziness when first wakes up in the am.  This resolves w/in 30 min of getting up and out of bed. This is stable compared to last visit.   --frequent urination improved, not dark in color.  Endocrinologist ordered HgbA1c  which was 6; monitoring his glucose which is 113 on average.   --no fevers, chills  --no rashes or other skin changes.   --No N/V  --lots of stress in his personal life w/r/t previous legal issues and ongoing financial strain.  Reached out to [facility] which has been helpful  --taking Ca+ supplement, but hasn't been drinking milk for a few days.  Usually does 2 glasses/milk and 1500mg  Ca+.             PAST MEDICAL HISTORY:   1. Diverticulitis.   2. History of ankylosing spondylitis in 2009. Please see past   medical history dictated on 11/15/2010 for details. In brief, he was treated w/Enbrel and prednisone for 6mos or so.  Symptoms improved, immunosuppressants were stopped and symptoms did not recur.   3. S/p incisional hernia repair Dec 2016      SOCIAL HISTORY:    reports that he has never smoked. He has never used smokeless tobacco. He reports current alcohol use. He reports that he does not use drugs.      MEDICATIONS:   Current Outpatient Medications   Medication Sig Dispense Refill   ??? binimetinib ([name]) 15 mg tablet Take 3 tablets (45 mg total) by mouth Two (2) times a day. 180 tablet 11   ??? blood sugar diagnostic Strp Dispense 100 blood glucose test strips, ok to sub any brand preferred by insurance/patient, use 3x/day; dispense whatever brand matches with meter. 100 strip 12   ??? blood-glucose meter kit Use as instructed; dispense 1 meter, whatever is preferred by insurance 1 each 1   ??? calcitriol (ROCALTROL) 0.25 MCG capsule TAKE 1 CAPSULE BY MOUTH DAILY 90 capsule 1   ??? calcium carbonate (OS-CAL) 500 mg calcium (1,250 mg) chewable tablet Chew 2,000 mg of elem calcium daily.      ??? encorafenib (BRAFTOVI) 75 mg capsule Take 6 capsules (450 mg total) by mouth daily. 180 capsule 11   ??? fexofenadine (ALLEGRA ALLERGY) 180 MG tablet Take 180 mg by mouth daily as needed.      ??? ibuprofen (ADVIL,MOTRIN) 200 MG tablet Take 200 mg by mouth every eight (8) hours as needed.      ??? lancets Misc Dispense 100 lancets, ok to sub any brand preferred by insurance/patient, use 3x/day 100 each 12   ??? levothyroxine (SYNTHROID) 125 MCG tablet TAKE (2 TABS) ON 5 DAYS/WEEK, AND TAKE 375 MCG (3 TABS) ON 2 DAYS/WEEK 192 tablet 3   ??? ondansetron (ZOFRAN) 8 MG tablet Take 1 tablet (8 mg total) by mouth every eight (8) hours as needed for nausea. 30 tablet 2     No current facility-administered medications for this visit.        REVIEW OF SYSTEMS  A complete review of systems was obtained and was negative except for those symptoms listed in the HPI     PHYSICAL EXAM  There were no vitals filed for this visit. This was a phone visit from home.    GEN: Awake and alert, pleasant appearing male in no acute distress  LUNGS: normal work of breathing.   SKIN: No rashes, petechiae or jaundice noted.  PYSCH: Alert and oriented to person, place and time  EXT: No edema noted of the lower extremity noted.      LABS  Lab Results   Component Value Date    WBC 7.3 06/22/2018    HGB 13.8 06/22/2018    HCT 43.6 06/22/2018    PLT 226 06/22/2018  Lab Results   Component Value Date    NA 141 06/22/2018    K 4.1 06/22/2018    CL 105 06/22/2018    CO2 22.0 06/22/2018    BUN 11 06/22/2018    CREATININE 0.93 06/22/2018    GLU 117 06/22/2018    CALCIUM 8.8 06/22/2018    MG 1.6 06/25/2016    PHOS 5.0 (H) 06/22/2018       Lab Results   Component Value Date    BILITOT 0.3 06/22/2018    BILIDIR 0.1 08/22/2011    PROT 6.6 06/22/2018    ALBUMIN 3.7 06/22/2018    ALT 34 06/22/2018    AST 41 06/22/2018    ALKPHOS 92 06/22/2018    GGT 42 08/22/2011       Lab Results   Component Value Date    INR 1.18 01/04/2015       RADIOLOGY RESULTS;  CT chest 05/13/2018:  IMPRESSION:  - Unchanged mediastinal lymphadenopathy. No definite new sites of intrathoracic metastatic disease within the chest.   -Interval patchy peribronchial groundglass opacities, likely of infectious versus inflammatory etiology.    CT abd: IMPRESSION:  Multiple enlarged lymph nodes within the abdomen and pelvis, some of which are new since prior. Additionally there is interval development of omental nodularity.  ??  New perivascular hypoattenuation (right > left) within the liver in a pattern atypical for melanotic metastases. This may represent perivascular steatosis, but the new narrowing of the portal vasculature suggests associated regional inflammation or mass effect. Consider contrast-enhanced abdominal MRI for further evaluation.    MRI brain 05/13/2018:   IMPRESSION:  No new lesions. Unchanged punctate T1 hyperintensities as described previously, which could represent metastatic lesions.      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  B: Lymph node, pelvic, core needle biopsy  - Metastatic melanoma  - See comment  ??     FNA R femur 07/19/14:  : Bone, right femur, core biopsy with touch imprints  - No primary or metastatic malignancy identified  - Bone with focal osteocyte dropout, suggestive of necrosis  - Bone marrow with trilineage hematopoiesis and focal effacement by chronic inflammation (see comment)    B: Bone, right femur, fine needle aspiration  - Few atypical cells present on direct smears (see comment)  - Scant fragments of edematous stromal tissue in cell block  - Bone marrow with trilineage hematopoiesis    Diagnosis:  Lymph nodes, right cervical, levels 2-4, removal and partial parotidectomy  -Metastatic melanoma involving 1 out of 20 lymph nodes, with the largest  diameter measuring 10 mm (1.0 cm) and no evidence of extracapsular extension  (1/20)  -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

## 2018-07-31 MED ORDER — APIXABAN 5 MG TABLET
ORAL_TABLET | Freq: Two times a day (BID) | ORAL | 11 refills | 30.00000 days | Status: CP
Start: 2018-07-31 — End: 2018-08-28

## 2018-07-31 MED ORDER — APIXABAN 5 MG (74 TABS) TABLETS IN A DOSE PACK
ORAL_TABLET | 0 refills | 0 days | Status: CP
Start: 2018-07-31 — End: 2018-08-28

## 2018-07-31 NOTE — Unmapped (Signed)
Clinical Pharmacist Practitioner: Melanoma Clinic    [name] is a 43 y.o. male with metastatic melanoma who I am calling for education on oral anticoagulation for portal vein thrombosis..    Medication regimen: apixaban (Eliquis) 10 mg BID x 7 days then 5 mg BID  Medication access: scripts sent to [facility]; confirmed $0 copay    Patient Education:  ?? Indication for use, dosing regimen, administration, and duration of treatment was discussed.  ?? Counseled on importance of adherence and taking medication as prescribed.  ?? Signs and symptoms of minor versus major bleeding were reviewed.  ?? Instructed to avoid use of NSAIDs as they may increase risk of bleed.    Medication profile was reviewed with patient and updated in Epic. No significant drug interactions were identified.     F/U: will have repeat labs in 1 month at local [facility] and phone appt with me after for tox check    ______________________________________________________________________    Current Medications:  Current Outpatient Medications   Medication Sig Dispense Refill   ??? apixaban (ELIQUIS) 5 mg Tab Take 1 tablet (5 mg total) by mouth Two (2) times a day. 60 tablet 11   ??? apixaban 5 mg (74 tabs) DsPk Take 10 mg (2 tablets) twice a day for 7 days, then take 5 mg (1 tablet) twice a day. 74 tablet 0   ??? binimetinib ([name]) 15 mg tablet Take 3 tablets (45 mg total) by mouth Two (2) times a day. 180 tablet 11   ??? blood sugar diagnostic Strp Dispense 100 blood glucose test strips, ok to sub any brand preferred by insurance/patient, use 3x/day; dispense whatever brand matches with meter. 100 strip 12   ??? blood-glucose meter kit Use as instructed; dispense 1 meter, whatever is preferred by insurance 1 each 1   ??? calcitriol (ROCALTROL) 0.25 MCG capsule TAKE 1 CAPSULE BY MOUTH DAILY 90 capsule 1   ??? calcium carbonate (OS-CAL) 500 mg calcium (1,250 mg) chewable tablet Chew 2,000 mg of elem calcium daily.      ??? encorafenib (BRAFTOVI) 75 mg capsule Take 6 capsules (450 mg total) by mouth daily. 180 capsule 11   ??? fexofenadine (ALLEGRA ALLERGY) 180 MG tablet Take 180 mg by mouth daily as needed.      ??? lancets Misc Dispense 100 lancets, ok to sub any brand preferred by insurance/patient, use 3x/day 100 each 12   ??? levothyroxine (SYNTHROID) 125 MCG tablet TAKE (2 TABS) ON 5 DAYS/WEEK, AND TAKE 375 MCG (3 TABS) ON 2 DAYS/WEEK 192 tablet 3   ??? ondansetron (ZOFRAN) 8 MG tablet Take 1 tablet (8 mg total) by mouth every eight (8) hours as needed for nausea. 30 tablet 2     No current facility-administered medications for this visit.        Laboratory Data:  No visits with results within 1 Day(s) from this visit.   Latest known visit with results is:   Lab on 07/29/2018   Component Date Value   ??? Sodium 07/29/2018 139    ??? Potassium 07/29/2018 3.7    ??? Chloride 07/29/2018 102    ??? Anion Gap 07/29/2018 14    ??? CO2 07/29/2018 23.0    ??? BUN 07/29/2018 14    ??? Creatinine 07/29/2018 1.13    ??? BUN/Creatinine Ratio 07/29/2018 12    ??? EGFR CKD-EPI Non-African* 07/29/2018 79    ??? EGFR CKD-EPI African Ame* 07/29/2018 >90    ??? Glucose 07/29/2018 129    ???  Calcium 07/29/2018 6.9*   ??? Albumin 07/29/2018 3.8    ??? Total Protein 07/29/2018 6.5    ??? Total Bilirubin 07/29/2018 0.4    ??? AST 07/29/2018 27    ??? ALT 07/29/2018 19    ??? Alkaline Phosphatase 07/29/2018 60    ??? LDH 07/29/2018 544    ??? WBC 07/29/2018 9.1    ??? RBC 07/29/2018 4.92    ??? HGB 07/29/2018 12.7*   ??? HCT 07/29/2018 40.1*   ??? MCV 07/29/2018 81.5    ??? [facility] 07/29/2018 25.7*   ??? MCHC 07/29/2018 31.6    ??? RDW 07/29/2018 16.9*   ??? MPV 07/29/2018 7.1    ??? Platelet 07/29/2018 271    ??? Neutrophils % 07/29/2018 74.0    ??? Lymphocytes % 07/29/2018 16.0    ??? Monocytes % 07/29/2018 3.6    ??? Eosinophils % 07/29/2018 4.8    ??? Basophils % 07/29/2018 0.4    ??? Absolute Neutrophils 07/29/2018 6.8    ??? Absolute Lymphocytes 07/29/2018 1.5    ??? Absolute Monocytes 07/29/2018 0.3    ??? Absolute Eosinophils 07/29/2018 0.4    ??? Absolute Basophils 07/29/2018 0.0    ??? Large Unstained Cells 07/29/2018 1    ??? Microcytosis 07/29/2018 Slight*   ??? Anisocytosis 07/29/2018 Slight*   ??? Hypochromasia 07/29/2018 Moderate*       I spent 15 minutes with [name] in direct patient care.    Bobby Rumpf, PharmD, BCOP, CPP  Melanoma Clinical Pharmacist Practitioner  Pager: [phone]

## 2018-07-31 NOTE — Unmapped (Signed)
Spoke with patient about scheduled MRI on 7/23 in [place] and updated appointments on 10/14.  Patient confirmed appointment details.    Thanks  CJ

## 2018-08-06 ENCOUNTER — Ambulatory Visit: Admit: 2018-08-06 | Discharge: 2018-08-07

## 2018-08-06 DIAGNOSIS — I81 Portal vein thrombosis: Principal | ICD-10-CM

## 2018-08-13 NOTE — Unmapped (Signed)
This encounter is erroneous - listed as inpatient when patient attended outpatient appt. No charges should occur for inpatient level care.

## 2018-08-20 NOTE — Unmapped (Signed)
[facility] Refill Coordination Note    Specialty Medication(s) to be Shipped:   Hematology/Oncology: Braftovi and Kingsley Plan, DOB: Apr 20, 1975  Phone: [phone] (home)       All above HIPAA information was verified with patient.     Completed refill call assessment today to schedule patient's medication shipment from the [facility] [phone]).       Specialty medication(s) and dose(s) confirmed: Regimen is correct and unchanged.   Changes to medications: Kenneth Fitzgerald reports no changes at this time.  Changes to insurance: No  Questions for the pharmacist: No    Confirmed patient received Welcome Packet with first shipment. The patient will receive a drug information handout for each medication shipped and additional FDA Medication Guides as required.       DISEASE/MEDICATION-SPECIFIC INFORMATION        N/A    SPECIALTY MEDICATION ADHERENCE     Medication Adherence    Patient reported X missed doses in the last month: 0  Specialty Medication: Braftovi 75 mg  Patient is on additional specialty medications: Yes  Additional Specialty Medications: [name] 15 mg  Patient Reported Additional Medication X Missed Doses in the Last Month: 0  Patient is on more than two specialty medications: No  Any gaps in refill history greater than 2 weeks in the last 3 months: no  Demonstrates understanding of importance of adherence: no  Informant: patient  Reliability of informant: reliable  Confirmed plan for next specialty medication refill: delivery by pharmacy          Refill Coordination    Has the Patients' Contact Information Changed: No  Is the Shipping Address Different: No           Braftovi 75 mg  : 7 days of medicine on hand   [name] 15 mg  : 7 days of medicine on hand       SHIPPING     Shipping address confirmed in Epic.     Delivery Scheduled: Yes, Expected medication delivery date: 08/25/2018.     Medication will be delivered via UPS to the home address in Epic WAM.    [name]   [facility] Shared [facility] Specialty Talent

## 2018-08-24 NOTE — Unmapped (Signed)
Kenneth Fitzgerald [facility] 's [name]/Braftovi shipment will be delayed as a result of the patient's insurance being terminated.      I have reached out to the patient and left a voicemail message.  We will wait for a call back from the patient to reschedule the delivery.  We have not confirmed the new delivery date.

## 2018-08-27 NOTE — Unmapped (Signed)
Kenneth Donning, CNA

## 2018-08-28 ENCOUNTER — Institutional Professional Consult (permissible substitution): Admit: 2018-08-28 | Discharge: 2018-08-29 | Attending: Oncology | Primary: Oncology

## 2018-08-28 ENCOUNTER — Telehealth: Admit: 2018-08-28 | Discharge: 2018-08-29 | Attending: Psychiatry | Primary: Psychiatry

## 2018-08-28 DIAGNOSIS — I81 Portal vein thrombosis: Secondary | ICD-10-CM

## 2018-08-28 DIAGNOSIS — Z79899 Other long term (current) drug therapy: Secondary | ICD-10-CM

## 2018-08-28 DIAGNOSIS — C439 Malignant melanoma of skin, unspecified: Principal | ICD-10-CM

## 2018-08-28 DIAGNOSIS — E278 Other specified disorders of adrenal gland: Secondary | ICD-10-CM

## 2018-08-28 DIAGNOSIS — F330 Major depressive disorder, recurrent, mild: Principal | ICD-10-CM

## 2018-08-28 MED ORDER — CALCITRIOL 0.25 MCG CAPSULE
ORAL_CAPSULE | Freq: Every day | ORAL | 1 refills | 30 days | Status: CP
Start: 2018-08-28 — End: ?
  Filled 2018-08-31: qty 30, 30d supply, fill #0

## 2018-08-28 MED ORDER — APIXABAN 5 MG TABLET
ORAL_TABLET | Freq: Two times a day (BID) | ORAL | 11 refills | 30 days | Status: CP
Start: 2018-08-28 — End: ?
  Filled 2018-08-31: qty 60, 30d supply, fill #0

## 2018-08-28 MED ORDER — LEVOTHYROXINE 125 MCG TABLET
ORAL_TABLET | 1 refills | 0 days | Status: CP
Start: 2018-08-28 — End: ?
  Filled 2018-08-31: qty 64, 28d supply, fill #0

## 2018-08-28 MED ORDER — OXYCODONE 5 MG TABLET
ORAL_TABLET | Freq: Three times a day (TID) | ORAL | 0 refills | 10.00000 days | Status: CP | PRN
Start: 2018-08-28 — End: 2019-08-28

## 2018-08-28 MED ORDER — GABAPENTIN 300 MG CAPSULE
ORAL_CAPSULE | Freq: Two times a day (BID) | ORAL | 2 refills | 30 days | Status: CP
Start: 2018-08-28 — End: 2019-08-28
  Filled 2018-08-31: qty 60, 30d supply, fill #0

## 2018-08-28 MED FILL — BRAFTOVI 75 MG CAPSULE: 30 days supply | Qty: 180 | Fill #3 | Status: AC

## 2018-08-28 MED FILL — MEKTOVI 15 MG TABLET: ORAL | 30 days supply | Qty: 180 | Fill #3

## 2018-08-28 MED FILL — BRAFTOVI 75 MG CAPSULE: ORAL | 30 days supply | Qty: 180 | Fill #3

## 2018-08-28 MED FILL — MEKTOVI 15 MG TABLET: 30 days supply | Qty: 180 | Fill #3 | Status: AC

## 2018-08-28 NOTE — Unmapped (Signed)
[facility]  Comprehensive Cancer Support Program/Psychiatry   Follow-up Patient Evaluation - Outpatient    Name: Kenneth Fitzgerald  Date: 08/28/2018  MRN: [id number]  DOB: 23-Sep-1975  REFERRING PROVIDER: Self, Referred  ONCOLOGIST: Lendell Caprice    Assessment:     I spent 50 minutes on the real-time audio and video with the patient. I spent an additional 10 minutes on pre- and post-visit activities.     The patient was physically located in [place] or a state in which I am permitted to provide care. The patient and/or parent/guardian understood that s/he may incur co-pays and cost sharing, and agreed to the telemedicine visit. The visit was reasonable and appropriate under the circumstances given the patient's presentation at the time.    The patient and/or parent/guardian has been advised of the potential risks and limitations of this mode of treatment (including, but not limited to, the absence of in-person examination) and has agreed to be treated using telemedicine. The patient's/patient's family's questions regarding telemedicine have been answered.     If the visit was completed in an ambulatory setting, the patient and/or parent/guardian has also been advised to contact their provider???s office for worsening conditions, and seek emergency medical treatment and/or call 911 if the patient deems either necessary.        Kenneth Fitzgerald is a 43 y.o., male with metastatic melanoma, h/o thyroid cancer, recent portal vein thrombosis, who presents upon referral of his oncologist for evaluation of depression and anxiety symptoms in setting of chronic medical issues and multiple severe chronic/subacute psychosocial stressors notably major legal issues and loss of employment/finances. He has  self-discontinued sertraline without adver effect or any changes in mood. His mood is lower in context of multiple large stressors.. He denies active SI and does not have immediate access to firearms.       Risk Assessment:  A suicide and violence risk assessment was performed as part of this evaluation. There patient is deemed to be at chronic elevated risk for self-harm/suicide given the following factors: current diagnosis of depression, suicidal ideation or threats without a plan and past diagnosis of depression. There patient is deemed to be at chronic elevated risk for violence given the following factors: male gender. These risk factors are mitigated by the following factors:lack of active SI/HI. There is no acute risk for suicide or violence at this time. The patient was educated about relevant modifiable risk factors including following recommendations for treatment of psychiatric illness and abstaining from substance abuse. While future psychiatric events cannot be accurately predicted, the patient does not currently require  acute inpatient psychiatric care and does not currently meet [facility] involuntary commitment criteria.      Diagnoses:   Patient Active Problem List   Diagnosis   ??? Recurrent major depressive disorder (CMS-HCC)   ??? Malignant neoplasm of thyroid gland (CMS-HCC)   ??? Postoperative hypothyroidism   ??? Hypocalcemia   ??? Thyroid cancer (CMS-HCC)   ??? Melanoma (CMS-HCC)   ??? Ventral hernia with bowel obstruction        Stressors: cancer survivorship, financial, employment, social/relational, legal    Disability Assessment Scale: est 30     Plan:  1. Recurrent MDD, mod  - Discussed with patient the need for an overarching treatment plan to assist him with his mood disorder and coping with stressors.  - Will re-discuss antidepressant options at next appt.  - Will engage one of my [facility] colleagues for more regularly scheduled counseling  with patient.   - Discontinued zoloft - denies adverse effects including withdrawal sx or worsening in mood.   - Referral to [facility] intervention  - Will provide patient with list of additional resources.    2. Suicidal thoughts  - I performed a suicide evaluation - Kenneth Fitzgerald is now at lower risk. No active SI.  No immediate access to firearms.  Strong religious faith and establishment with local provider.     Follow-up with me in one month.    Revised Medication(s) Post Visit:  Outpatient Encounter Medications as of 08/28/2018   Medication Sig Dispense Refill   ??? apixaban (ELIQUIS) 5 mg Tab Take 1 tablet (5 mg total) by mouth Two (2) times a day. 60 tablet 11   ??? apixaban 5 mg (74 tabs) DsPk Take 10 mg (2 tablets) twice a day for 7 days, then take 5 mg (1 tablet) twice a day. 74 tablet 0   ??? binimetinib ([name]) 15 mg tablet Take 3 tablets (45 mg total) by mouth Two (2) times a day. 180 tablet 11   ??? blood sugar diagnostic Strp Dispense 100 blood glucose test strips, ok to sub any brand preferred by insurance/patient, use 3x/day; dispense whatever brand matches with meter. 100 strip 12   ??? blood-glucose meter kit Use as instructed; dispense 1 meter, whatever is preferred by insurance 1 each 1   ??? calcitriol (ROCALTROL) 0.25 MCG capsule TAKE 1 CAPSULE BY MOUTH DAILY 90 capsule 1   ??? calcium carbonate (OS-CAL) 500 mg calcium (1,250 mg) chewable tablet Chew 2,000 mg of elem calcium daily.      ??? encorafenib (BRAFTOVI) 75 mg capsule Take 6 capsules (450 mg total) by mouth daily. 180 capsule 11   ??? fexofenadine (ALLEGRA ALLERGY) 180 MG tablet Take 180 mg by mouth daily as needed.      ??? lancets Misc Dispense 100 lancets, ok to sub any brand preferred by insurance/patient, use 3x/day 100 each 12   ??? levothyroxine (SYNTHROID) 125 MCG tablet TAKE (2 TABS) ON 5 DAYS/WEEK, AND TAKE 375 MCG (3 TABS) ON 2 DAYS/WEEK 192 tablet 3   ??? ondansetron (ZOFRAN) 8 MG tablet Take 1 tablet (8 mg total) by mouth every eight (8) hours as needed for nausea. 30 tablet 2     No facility-administered encounter medications on file as of 08/28/2018.        Patient was provided with my contact information. He/she was instructed to call 911 for emergencies.     Thank you for allowing me to participate in the care of this patient  Toula Moos, MD    Subjective:  Recently saw medical oncology for follow-up and for scans.  He reports multiple interim events - cancer progression on scans, now on BRAF/MEK inhibitors - tolerating okay but feeling fatigued, some dizziness.  Ongoing legal problems which has been a major source of distress.  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  He has worked to engage more socially with his church, friends which has helped though requires substantial effort.   He works long hours at his job, acknowledges likely too much as a way to not focus on his many other problems.  Feeling more depressed, occ passive SI why am I alive?, no active intent or plan.  No access to firearms.  Trying hard to accept his current situation without bitterness.    Off zoloft.  Did not experience  any adverse effects with this nor did he experience any changes in his mood with this though notes this coincided with finding new job.     Sleep is not great, +initial and maintenance insomnia - he is okay not intervening on this.  Not as physically active as he used to be. Appetite stable.  Tired. No sx mania or psychosis.     Allergies:  Compazine [prochlorperazine] and Coconut    Medications:   Current Outpatient Medications   Medication Sig Dispense Refill   ??? apixaban (ELIQUIS) 5 mg Tab Take 1 tablet (5 mg total) by mouth Two (2) times a day. 60 tablet 11   ??? apixaban 5 mg (74 tabs) DsPk Take 10 mg (2 tablets) twice a day for 7 days, then take 5 mg (1 tablet) twice a day. 74 tablet 0   ??? binimetinib ([name]) 15 mg tablet Take 3 tablets (45 mg total) by mouth Two (2) times a day. 180 tablet 11   ??? blood sugar diagnostic Strp Dispense 100 blood glucose test strips, ok to sub any brand preferred by insurance/patient, use 3x/day; dispense whatever brand matches with meter. 100 strip 12   ??? blood-glucose meter kit Use as instructed; dispense 1 meter, whatever is preferred by insurance 1 each 1   ??? calcitriol (ROCALTROL) 0.25 MCG capsule TAKE 1 CAPSULE BY MOUTH DAILY 90 capsule 1   ??? calcium carbonate (OS-CAL) 500 mg calcium (1,250 mg) chewable tablet Chew 2,000 mg of elem calcium daily.      ??? encorafenib (BRAFTOVI) 75 mg capsule Take 6 capsules (450 mg total) by mouth daily. 180 capsule 11   ??? fexofenadine (ALLEGRA ALLERGY) 180 MG tablet Take 180 mg by mouth daily as needed.      ??? lancets Misc Dispense 100 lancets, ok to sub any brand preferred by insurance/patient, use 3x/day 100 each 12   ??? levothyroxine (SYNTHROID) 125 MCG tablet TAKE (2 TABS) ON 5 DAYS/WEEK, AND TAKE 375 MCG (3 TABS) ON 2 DAYS/WEEK 192 tablet 3   ??? ondansetron (ZOFRAN) 8 MG tablet Take 1 tablet (8 mg total) by mouth every eight (8) hours as needed for nausea. 30 tablet 2     No current facility-administered medications for this visit.        Psychiatric/Medical History:  Past Medical History:   Diagnosis Date   ??? Cancer (CMS-HCC)     melanoma, thyroid cancer   ??? Disease of thyroid gland     hypothyroid   ??? Hypothyroidism    ??? Skin cancer        Surgical History:  Past Surgical History:   Procedure Laterality Date   ??? PR COLONOSCOPY W/BIOPSY SINGLE/MULTIPLE N/A 01/09/2018    Procedure: COLONOSCOPY, FLEXIBLE, PROXIMAL TO SPLENIC FLEXURE; WITH BIOPSY, SINGLE OR MULTIPLE;  Surgeon: Vonda Antigua, MD;  [facility];  Service: Gastroenterology   ??? PR IMPLANT MESH HERNIA REPAIR/DEBRIDEMENT CLOSURE N/A 01/03/2015    Procedure: IMPLANTATION OF MESH/OTHER PROSTHES INCISION/VENTRAL HERNIA REPAIR/MESH CLOSE DEBRID NECROT SOFT TIS INFECT;  Surgeon: Romero Belling, MD;  Location: MAIN OR [place];  Service: Gastrointestinal   ??? PR LAP, VENTRAL HERNIA REPAIR,REDUCIBLE N/A 04/07/2014    Procedure: LAPAROSCOPY, SURGICAL, REPAIR, VENTRAL, UMBILICAL, SPIGELIAN OR EPIGASTRIC HERNIA, REDUCIBLE;  Surgeon: Romero Belling, MD;  Location: MAIN OR [place];  Service: Gastrointestinal   ??? PR REMOVAL NODES, NECK,CERV CMPLT Left 12/07/2012    Procedure: CERVICAL LYMPHADENECTOMY (COMPLETE);  Surgeon: Charlott Rakes, MD;  [facility];  Service: Surgical Oncology   ??? PR REMOVAL NODES, NECK,CERV MOD RAD Right 03/29/2013    Procedure: CERVICAL LYMPHADENECTOMY (MODIFIED RADICAL NECK DISSECTION);  Surgeon: Charlott Rakes, MD;  Location: [facility] OR [facility];  Service: Surgical Oncology   ??? PR REPAIR RECURR INCIS HERNIA,REDUC N/A 01/03/2015    Procedure: REPAIR RECURRENT INCISIONAL OR VENTRAL HERNIA; REDUCIBLE;  Surgeon: Romero Belling, MD;  Location: MAIN OR [place];  Service: Gastrointestinal   ??? PR REPAIR RECURR INCIS HERNIA,STRANG N/A 06/21/2016    Procedure: REPAIR RECURRENT INCISIONAL OR VENTRAL HERNIA; INCARCERATED OR STRANGULATED;  Surgeon: Mickle Asper, MD;  Location: MAIN OR [place];  Service: Gastrointestinal   ??? PR SIGMOIDOSCOPY,FINE NEEDL BX,US GUIDED N/A 01/09/2018    Procedure: SIGMOIDOSCOPY, FLEXIBLE, W/TRANSENDOSCOPIC ULTRASOUND GUIDED NEEDLE ASPIRATION;  Surgeon: Vonda Antigua, MD;  [facility];  Service: Gastroenterology       Social History:  Social History     Socioeconomic History   ??? Marital status: Single     Spouse name: Not on file   ??? Number of children: Not on file   ??? Years of education: Not on file   ??? Highest education level: Not on file   Occupational History   ??? Not on file   Social Needs   ??? Financial resource strain: Not on file   ??? Food insecurity     Worry: Not on file     Inability: Not on file   ??? Transportation needs     Medical: Not on file     Non-medical: Not on file   Tobacco Use   ??? Smoking status: Never Smoker   ??? Smokeless tobacco: Never Used   Substance and Sexual Activity   ??? Alcohol use: Yes     Comment: social drinker    ??? Drug use: No   ??? Sexual activity: Not on file   Lifestyle   ??? Physical activity     Days per week: Not on file     Minutes per session: Not on file   ??? Stress: Not on file   Relationships   ??? Social Psychologist, counselling on phone: Not on file     Gets together: Not on file     Attends religious service: Not on file     Active member of club or organization: Not on file     Attends meetings of clubs or organizations: Not on file     Relationship status: Not on file   Other Topics Concern   ??? Not on file   Social History Narrative   ??? Not on file       Family History:  The patient's family history includes Arrhythmia in his mother; Colon cancer in his paternal grandmother; Coronary artery disease in his father; Diabetes in his father; Hypertension in his father; Hyperthyroidism in his mother; Osteoporosis in his mother; Prostate cancer in his father; Squamous cell carcinoma in his mother..    ROS: The balance of 10 systems was reviewed with the patient and is negative except for the following: fatigue, interrupted sleep, dizziness, depressed mood    Objective:     Vitals:   There were no vitals filed for this visit.    Mental Status Exam:  Appearance:    Appears stated age   Motor:   No abnormal movements   Speech/Language:    Normal rate, volume, tone, fluency   Mood:   it's been harder   Affect:    More reactive than  previously.   Thought process:   Logical, linear, clear, coherent, goal directed   Thought content:     Denies SI, HI, self harm, delusions, obsessions, paranoid ideation, or ideas of reference. Future oriented.   Perceptual disturbances:     Denies auditory and visual hallucinations, behavior not concerning for response to internal stimuli     Orientation:   Oriented to person, place, time, and general circumstances   Attention:   Able to fully attend without fluctuations in consciousness   Concentration:   Able to fully concentrate and attend   Memory:   Immediate, short-term, long-term, and recall grossly intact    Fund of knowledge:    Consistent with level of education and development   Insight:     Intact   Judgment:    Intact   Impulse Control:   Intact     PE:   General: in NAD  Neuro: CN II-XII intact. No abnormal movements.  Gait assessment deferred.    Test Results:  Data Review: Lab results last 24 hours:    No results found for this or any previous visit (from the past 24 hour(s)).  Imaging: Radiology report(s) reviewed.      Toula Moos, MD  08/28/2018

## 2018-08-28 NOTE — Unmapped (Signed)
Clinical Pharmacist Practitioner: Melanoma Clinic    Patient Name: Kenneth Fitzgerald [facility]  Patient Age: 43 y.o.  Encounter Date: 08/28/2018  Primary Oncologist: Lendell Caprice, MD    Reason for call: oral targeted therapy follow-up    Assessment & Plan:   1. Metastatic melanoma: started Braftovi + [name] ~3 months ago. Tolerating both medications with no significant toxicities at this time. Experiencing some increased fatigue, but does not want to decrease dose of his targeted therapy agents at this time as it remains tolerable. Dizziness improved with change in Braftovi administration time.  ?? Continue Braftovi 450 mg qhs + [name] 45 mg BID    2. Medication access: reports recently losing BCBS coverage and is trying to get it reinstated. Will work on Adult nurse for [employer] and enrollment into PAP for other prescription medications. Signed him up for 30 day temporary PAP benefit and emailed him PAP paperwork to complete and submit within next 2 weeks.  ?? Follow-up on manufacturer assistance for Braftovi + [name]  ?? Follow-up on completion of PAP paperwork    3. Tooth pain: unable to see dentist at this time due to lack of insurance coverage. OTC treatments have been ineffective. Pain is worse in evening and sometimes affects sleep.  ?? Start gabapentin 300 mg qHS, increase to BID after a few days if tolerated  ?? Start oxycodone 5 mg q8h PRN severe pain     4. Hypocalcemia: has not been taking calcium supplement or calcitriol for at least 3-4 weeks. Calcium low at 6.9 during last appt on 7/15.  ?? Local labs next week  ?? Restart calcitriol and calcium supplement    5. Muscle cramps: may be 2/2 to hypocalcemia. Will check electrolyte levels next week.  ?? Local labs next week    6. Portal vein thrombosis: likely related to underlying malignancy. Started Eliquis ~1 month ago.   ?? Continue apixaban 5 mg BID    F/U: labs locally next week; appt with Dr. Nedra Hai on 10/14 ______________________________________________________________________    HPI: Demonte is a 43 y.o. male with metastatic melanoma who is currently on treatment with Braftovi + [name].    Oral chemotherapy: Braftovi 450 mg qhs + [name] 45 mg BID   Start date: 05/29/18  Specialty pharmacy: [facility]  Copay: $0    Interim History: chief complaint today is tooth pain. Described as sharp, stabbing pain that is more severe at night (pain score 7-8/10) versus during the day (pain score 2-3). Temporarily relieved with Oragel for ~10 minutes. Tylenol does not improve pain. Reports running low on targeted therapy with only one day supply left due to recently losing insurance. Continues to have some increased fatigue on targeted therapy, but attributes it more to working longer hours and in the heat. Reports muscle cramps in legs, arms and abdomen that last anywhere from 15-45 minutes. Has not taken his calcitriol or calcium supplementation for a few weeks now but is drinking more milk. Still experiences dizziness intermittently in the morning but this has improved since he started taking Braftovi earlier in the evening. Denies rash, nausea, diarrhea, vision changes.    Adherence: no issues  Drug-Drug Interactions: none    Oncology History:  -Recurrent stage IIIb melanoma. Completed high dose adjuvant interferon in Jan 2014.   -S/p thyroidectomy 06/2011 and radioactive iodine 03/2012 and again 05/2013. ??  -Thyroid CA recurrence 11/2012 s/p L neck dissecion 12/07/2012.  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 Recurrent major depressive disorder (CMS-HCC)   ??? Malignant neoplasm of thyroid gland (CMS-HCC)   ??? Postoperative hypothyroidism   ??? Hypocalcemia   ??? Thyroid cancer (CMS-HCC)   ??? Melanoma (CMS-HCC)   ??? Ventral hernia with bowel obstruction       Medications:  Current Outpatient Medications   Medication Sig Dispense Refill   ??? apixaban (ELIQUIS) 5 mg Tab Take 1 tablet (5 mg total) by mouth Two (2) times a day. 60 tablet 11   ??? apixaban 5 mg (74 tabs) DsPk Take 10 mg (2 tablets) twice a day for 7 days, then take 5 mg (1 tablet) twice a day. 74 tablet 0   ??? binimetinib ([name]) 15 mg tablet Take 3 tablets (45 mg total) by mouth Two (2) times a day. 180 tablet 11   ??? blood sugar diagnostic Strp Dispense 100 blood glucose test strips, ok to sub any brand preferred by insurance/patient, use 3x/day; dispense whatever brand matches with meter. 100 strip 12   ??? blood-glucose meter kit Use as instructed; dispense 1 meter, whatever is preferred by insurance 1 each 1   ??? calcitriol (ROCALTROL) 0.25 MCG capsule TAKE 1 CAPSULE BY MOUTH DAILY 90 capsule 1   ??? calcium carbonate (OS-CAL) 500 mg calcium (1,250 mg) chewable tablet Chew 2,000 mg of elem calcium daily.      ??? encorafenib (BRAFTOVI) 75 mg capsule Take 6 capsules (450 mg total) by mouth daily. 180 capsule 11   ??? fexofenadine (ALLEGRA ALLERGY) 180 MG tablet Take 180 mg by mouth daily as needed.      ??? lancets Misc Dispense 100 lancets, ok to sub any brand preferred by insurance/patient, use 3x/day 100 each 12   ??? levothyroxine (SYNTHROID) 125 MCG tablet TAKE (2 TABS) ON 5 DAYS/WEEK, AND TAKE 375 MCG (3 TABS) ON 2 DAYS/WEEK 192 tablet 3   ??? ondansetron (ZOFRAN) 8 MG tablet Take 1 tablet (8 mg total) by mouth every eight (8) hours as needed for nausea. 30 tablet 2     No current facility-administered medications for this visit.        Allergies:  Allergies   Allergen Reactions   ??? Compazine [Prochlorperazine] Itching   ??? Coconut Nausea And Vomiting       Personal and Social History:   Social History     Tobacco Use   ??? Smoking status: Never Smoker   ??? Smokeless tobacco: Never Used   Substance Use Topics   ??? Alcohol use: Yes     Comment: social drinker    He reports no history of drug use.    Family History:  His family history includes Arrhythmia in his mother; Colon cancer in his paternal grandmother; Coronary artery disease in his father; Diabetes in his father; Hypertension in his father; Hyperthyroidism in his mother; Osteoporosis in his mother; Prostate cancer in his father; Squamous cell carcinoma in his mother.    Review of Systems: A complete review of systems was obtained including: Constitutional, Eyes, ENT, Cardiovascular, Respiratory, GI, GU, Musculoskeletal, Skin, Neurological, Psychiatric, Endocrine, Heme/Lymphatic, and Allergic/Immunologic systems. All other systems reviewed and  are negative to the patient???s management except for what was mentioned in the interim history.     Vital Signs: There were no vitals taken for this visit.    Laboratory Data:  No visits with results within 1 Day(s) from this visit.   Latest known visit with results is:   Lab on 07/29/2018   Component Date Value   ??? Sodium 07/29/2018 139    ??? Potassium 07/29/2018 3.7    ??? Chloride 07/29/2018 102    ??? Anion Gap 07/29/2018 14    ??? CO2 07/29/2018 23.0    ??? BUN 07/29/2018 14    ??? Creatinine 07/29/2018 1.13    ??? BUN/Creatinine Ratio 07/29/2018 12    ??? EGFR CKD-EPI Non-African* 07/29/2018 79    ??? EGFR CKD-EPI African Ame* 07/29/2018 >90    ??? Glucose 07/29/2018 129    ??? Calcium 07/29/2018 6.9*   ??? Albumin 07/29/2018 3.8    ??? Total Protein 07/29/2018 6.5    ??? Total Bilirubin 07/29/2018 0.4    ??? AST 07/29/2018 27    ??? ALT 07/29/2018 19    ??? Alkaline Phosphatase 07/29/2018 60    ??? LDH 07/29/2018 544    ??? WBC 07/29/2018 9.1    ??? RBC 07/29/2018 4.92    ??? HGB 07/29/2018 12.7*   ??? HCT 07/29/2018 40.1*   ??? MCV 07/29/2018 81.5    ??? [facility] 07/29/2018 25.7*   ??? MCHC 07/29/2018 31.6    ??? RDW 07/29/2018 16.9*   ??? MPV 07/29/2018 7.1    ??? Platelet 07/29/2018 271    ??? Neutrophils % 07/29/2018 74.0    ??? Lymphocytes % 07/29/2018 16.0    ??? Monocytes % 07/29/2018 3.6    ??? Eosinophils % 07/29/2018 4.8    ??? Basophils % 07/29/2018 0.4    ??? Absolute Neutrophils 07/29/2018 6.8    ??? Absolute Lymphocytes 07/29/2018 1.5    ??? Absolute Monocytes 07/29/2018 0.3    ??? Absolute Eosinophils 07/29/2018 0.4    ??? Absolute Basophils 07/29/2018 0.0    ??? Large Unstained Cells 07/29/2018 1    ??? Microcytosis 07/29/2018 Slight*   ??? Anisocytosis 07/29/2018 Slight*   ??? Hypochromasia 07/29/2018 Moderate*        Medication Therapy Problems    Medication Therapy Visit Type: Telephone     Medication Therapy Problem 1  Condition: Hypocalcemia  Medications: Calcium carbonate  ADHERENCE  Adherence: patient forgets to take  Medication therapy problem history: New  Medication therapy problem status: resolved     Medication Therapy Problem 2  Condition: Hypocalcemia   Medications: Calcitriol  ADHERENCE  Adherence: patient forgets to take  Medication therapy problem history: New  Medication therapy problem status: resolved    Medication Therapy Problem 3  Condition: Tooth pain  Medications: Gabapentin  INDICATION  Needs additional medication therapy: untreated condition  Medication therapy problem history: New  Medication therapy problem status: resolved     Medication Therapy Problem 4  Condition: Tooth pain  Medications: Oxycodone  INDICATION  Needs additional medication therapy: untreated condition  Medication therapy problem history: New  Medication therapy problem status: resolved     Medication Therapy Problem 5  Condition: Melanoma  Medications: Braftovi  ADHERENCE  Cost: cannot afford medication  Medication therapy problem history: New  Medication therapy problem status: pending      Medication Therapy Problem 6  Condition: Melanoma  Medications: [name]  ADHERENCE  Cost: cannot afford medication  Medication therapy problem history: New  Medication  therapy problem status: pending     Medication Therapy Problem 7  Condition: Portal Vein Thrombosis  Medications: Eliquis  ADHERENCE  Cost: cannot afford medication  Medication therapy problem history: New  Medication therapy problem status: pending      Horatio Pel, PharmD  PGY1 Pharmacy Resident    I was the precepting pharmacist in the delivery of services. I agree with the plan as documented.    Bobby Rumpf, PharmD, BCOP, CPP  Melanoma Clinical Pharmacist Practitioner  Pager: [phone]     I spent 45 minutes on the phone with the patient. I spent an additional 60 minutes on pre- and post-visit activities.     The patient was physically located in [place] or a state in which I am permitted to provide care. The patient and/or parent/guardian understood that s/he may incur co-pays and cost sharing, and agreed to the telemedicine visit. The visit was reasonable and appropriate under the circumstances given the patient's presentation at the time.    The patient and/or parent/guardian has been advised of the potential risks and limitations of this mode of treatment (including, but not limited to, the absence of in-person examination) and has agreed to be treated using telemedicine. The patient's/patient's family's questions regarding telemedicine have been answered.     If the visit was completed in an ambulatory setting, the patient and/or parent/guardian has also been advised to contact their provider???s office for worsening conditions, and seek emergency medical treatment and/or call 911 if the patient deems either necessary.

## 2018-08-28 NOTE — Unmapped (Signed)
Comprehensive Cancer Support Program (CCSP) - Psychiatry Outpatient Clinic   After Visit Summary    It was a pleasure to see you today in the [facility]???s Comprehensive Cancer Support Program (CCSP). The CCSP is a multidisciplinary program dedicated to helping patients, caregivers, and families with cancer treatment, recovery and survivorship.      To schedule, cancel, or change your appointment:  Please call the [facility] schedulers at [phone], Monday through Friday 8AM - 5PM.  Someone will return your call within 24 hours.      If you have a question about your medicines or you need to contact your provider:  Please call the CCSP program coordinator, Myrene Galas, at [phone].     For after hours urgent issues, you may call [phone] or call the I need to talk line at [phone] ([phone] anytime 24/7.    CCSP Patient and Family Resource Center: [phone].    CCSP Website:  [email or web address]    For prescription refills, please allow at least 24 hours (during business hours, M-F) for providers to call in refills to your pharmacy. We are generally unable to accommodate same-day requests for refills.     If you are taking any controlled substances (such as anxiety or sleep medications), you must use them as the directions say to use them. We generally do not provide early refills over the phone without clear reason, and it would be inappropriate to obtain the medications from other doctors. We routinely use the [place] controlled substance database to monitor prescription drug use.

## 2018-08-28 NOTE — Unmapped (Signed)
Bobby Rumpf, CPP was able to secure a medication voucher for patient to use this month.  Patient will receive delivery on 08/29/18.

## 2018-08-31 MED FILL — SYNTHROID 125 MCG TABLET: 28 days supply | Qty: 64 | Fill #0 | Status: AC

## 2018-08-31 MED FILL — ELIQUIS 5 MG TABLET: 30 days supply | Qty: 60 | Fill #0 | Status: AC

## 2018-08-31 MED FILL — GABAPENTIN 300 MG CAPSULE: 30 days supply | Qty: 60 | Fill #0 | Status: AC

## 2018-08-31 MED FILL — CALCITRIOL 0.25 MCG CAPSULE: 30 days supply | Qty: 30 | Fill #0 | Status: AC

## 2018-09-01 NOTE — Unmapped (Signed)
Prior authorization for Eliquis (apixaban) under the Pharmacy Assistance Program (PAP) is approved through 08/31/2019.   Please do not hesitate to contact me if you have any questions or concerns.    Sincerely,     Kathlene November  [facility] Operations Specialist  [phone] opt [phone]

## 2018-09-02 NOTE — Unmapped (Signed)
Kenneth Fitzgerald with [employer] contacted the Communication Center requesting to speak with the care team of Kenneth Fitzgerald [facility] to discuss:    For 5/13, 6/7 and 7/6, prescriptions for [name] 15 mg and Braftovi 75 mg were billed to [facility]. Patient did not have an active policy at the time and they are going to bill the patient for $60,000.00 for those prescriptions.    Please contact Lisa at [phone].      Thank you,   THAILAND DUBE  [facility] Cancer Communication Center   [phone]

## 2018-09-03 NOTE — Unmapped (Signed)
Dyanne Carrel with [employer] contacted the Communication Center requesting to speak with the care team of Demarion Pondexter [facility] to discuss:    Misty Stanley would like to discuss getting medical assistance regarding medication patient received Brastovi 75mg , mektovi 15 mg    Please contact Lisa at [phone] ext [phone].    Program:   Speciality:     Check Indicates criteria has been reviewed and confirmed with the patient:    []  Preferred Name   [x]  DOB and/or MR#  [x]  Preferred Contact Method  [x]  Phone Number(s)   []  MyChart     Thank you,   Christell Faith  [facility] Cancer Communication Center   [phone]

## 2018-09-04 LAB — CBC W/ DIFFERENTIAL
BANDED NEUTROPHILS ABSOLUTE COUNT: 0.1 10*3/uL (ref 0.0–0.1)
BASOPHILS RELATIVE PERCENT: 1 %
EOSINOPHILS ABSOLUTE COUNT: 0.4 10*3/uL (ref 0.0–0.4)
EOSINOPHILS RELATIVE PERCENT: 3 %
HEMATOCRIT: 37.3 % — ABNORMAL LOW (ref 37.5–51.0)
HEMOGLOBIN: 12.1 g/dL — ABNORMAL LOW (ref 13.0–17.7)
IMMATURE GRANULOCYTES: 1 %
LYMPHOCYTES RELATIVE PERCENT: 14 %
MEAN CORPUSCULAR HEMOGLOBIN CONC: 32.4 g/dL (ref 31.5–35.7)
MEAN CORPUSCULAR HEMOGLOBIN: 25.3 pg — ABNORMAL LOW (ref 26.6–33.0)
MEAN CORPUSCULAR VOLUME: 78 fL — ABNORMAL LOW (ref 79–97)
MONOCYTES ABSOLUTE COUNT: 0.5 10*3/uL (ref 0.1–0.9)
MONOCYTES RELATIVE PERCENT: 5 %
NEUTROPHILS ABSOLUTE COUNT: 8.6 10*3/uL — ABNORMAL HIGH (ref 1.4–7.0)
NEUTROPHILS RELATIVE PERCENT: 76 %
PLATELET COUNT: 360 10*3/uL (ref 150–450)
RED BLOOD CELL COUNT: 4.79 x10E6/uL (ref 4.14–5.80)
RED CELL DISTRIBUTION WIDTH: 14.9 % (ref 11.6–15.4)
WHITE BLOOD CELL COUNT: 11.4 10*3/uL — ABNORMAL HIGH (ref 3.4–10.8)

## 2018-09-04 LAB — COMPREHENSIVE METABOLIC PANEL
A/G RATIO: 1.5 (ref 1.2–2.2)
ALBUMIN: 4.1 g/dL (ref 4.0–5.0)
ALKALINE PHOSPHATASE: 74 IU/L (ref 39–117)
ALT (SGPT): 17 IU/L (ref 0–44)
AST (SGOT): 22 IU/L (ref 0–40)
BILIRUBIN TOTAL: <0.2 mg/dL (ref 0.0–1.2)
BLOOD UREA NITROGEN: 16 mg/dL (ref 6–24)
BUN / CREAT RATIO: 13 (ref 9–20)
CALCIUM: 8.2 mg/dL — ABNORMAL LOW (ref 8.7–10.2)
CHLORIDE: 103 mmol/L (ref 96–106)
CO2: 20 mmol/L (ref 20–29)
GFR MDRD AF AMER: 79 mL/min/{1.73_m2}
GFR MDRD NON AF AMER: 69 mL/min/{1.73_m2}
GLOBULIN, TOTAL: 2.7 g/dL (ref 1.5–4.5)
GLUCOSE: 120 mg/dL — ABNORMAL HIGH (ref 65–99)
POTASSIUM: 4.4 mmol/L (ref 3.5–5.2)
TOTAL PROTEIN: 6.8 g/dL (ref 6.0–8.5)

## 2018-09-04 LAB — PHOSPHORUS, SERUM: Phosphate:MCnc:Pt:Ser/Plas:Qn:: 5.8 — ABNORMAL HIGH

## 2018-09-04 LAB — MEAN CORPUSCULAR VOLUME: Erythrocyte mean corpuscular volume:EntVol:Pt:RBC:Qn:Automated count: 78 — ABNORMAL LOW

## 2018-09-04 LAB — MAGNESIUM: Magnesium:MCnc:Pt:Ser/Plas:Qn:: 2

## 2018-09-04 LAB — A/G RATIO: Albumin/Globulin:MRto:Pt:Ser/Plas:Qn:: 1.5

## 2018-09-07 NOTE — Unmapped (Signed)
Kenneth Fitzgerald with [employer] contacted the [employer] requesting to speak with the care team of Kenneth Fitzgerald [facility] to discuss:    Kenneth Fitzgerald and Kenneth Fitzgerald were billed incorrectly and were not covered his policy ended 02/13/2018 and she wants to see if we can offer the patient medication assistance.  Patient will be billed for $64,000.     Please contact at [phone] ext [phone].    Program: Melanoma  Speciality: Medical Oncology    Check Indicates criteria has been reviewed and confirmed with the patient:    []  Preferred Name   [x]  DOB and/or MR#  [x]  Preferred Contact Method  [x]  Phone Number(s)   []  MyChart     Thank you,   Kenneth Fitzgerald  [facility] Cancer Communication Center   [phone]

## 2018-09-09 NOTE — Unmapped (Signed)
Dyanne Carrel with [employer] contacted the Communication Center regarding the following:    - Misty Stanley called regarding that Inda Castle and Johney Maine were billed incorrectly and were not covered his policy ended 02/13/18 and Misty Stanley would like to know if we can offer any medical assistance.    Please contact Misty Stanley at [phone] ext [phone].    Thanks in advance,    Christell Faith  [facility] Cancer Communication Center   [phone]

## 2018-09-09 NOTE — Unmapped (Signed)
Attempted to return call. No answer.

## 2018-09-10 ENCOUNTER — Telehealth: Admit: 2018-09-10 | Discharge: 2018-09-11 | Attending: Clinical | Primary: Clinical

## 2018-09-15 NOTE — Unmapped (Signed)
Spoke with pt over the phone. Went over smoking, allergies, and medications.

## 2018-09-22 ENCOUNTER — Telehealth: Admit: 2018-09-22 | Discharge: 2018-09-23 | Attending: Psychiatry | Primary: Psychiatry

## 2018-09-22 DIAGNOSIS — F330 Major depressive disorder, recurrent, mild: Secondary | ICD-10-CM

## 2018-09-22 NOTE — Unmapped (Signed)
Comprehensive Cancer Support Program (CCSP) - Psychiatry Outpatient Clinic   After Visit Summary    It was a pleasure to see you today in the [facility]???s Comprehensive Cancer Support Program (CCSP). The CCSP is a multidisciplinary program dedicated to helping patients, caregivers, and families with cancer treatment, recovery and survivorship.      To schedule, cancel, or change your appointment:  Please call the [facility] schedulers at [phone], Monday through Friday 8AM - 5PM.  Someone will return your call within 24 hours.      If you have a question about your medicines or you need to contact your provider:  Please call the CCSP program coordinator, Myrene Galas, at [phone].     For after hours urgent issues, you may call [phone] or call the I need to talk line at [phone] ([phone] anytime 24/7.    CCSP Patient and Family Resource Center: [phone].    CCSP Website:  [email or web address]    For prescription refills, please allow at least 24 hours (during business hours, M-F) for providers to call in refills to your pharmacy. We are generally unable to accommodate same-day requests for refills.     If you are taking any controlled substances (such as anxiety or sleep medications), you must use them as the directions say to use them. We generally do not provide early refills over the phone without clear reason, and it would be inappropriate to obtain the medications from other doctors. We routinely use the [place] controlled substance database to monitor prescription drug use.

## 2018-09-22 NOTE — Unmapped (Signed)
I spoke with Kenneth Fitzgerald today 09/22/2018. He is working with [employer] for assistance. He told me he would be in touch with Will later today and has his number.

## 2018-09-22 NOTE — Unmapped (Signed)
[facility]  Comprehensive Cancer Support Program/Psychiatry   Follow-up Patient Evaluation - Outpatient    Name: Kenneth Fitzgerald [facility]  Date: 09/21/2018  MRN: [id number]  DOB: 28-Dec-1975  REFERRING PROVIDER: Gillis Santa, MD  ONCOLOGIST: Lendell Caprice      I spent 40 minutes on the real-time audio and video with the patient. I spent an additional 10 minutes on pre- and post-visit activities.     The patient was physically located in [place] or a state in which I am permitted to provide care. The patient and/or parent/guardian understood that s/he may incur co-pays and cost sharing, and agreed to the telemedicine visit. The visit was reasonable and appropriate under the circumstances given the patient's presentation at the time.    The patient and/or parent/guardian has been advised of the potential risks and limitations of this mode of treatment (including, but not limited to, the absence of in-person examination) and has agreed to be treated using telemedicine. The patient's/patient's family's questions regarding telemedicine have been answered.     If the visit was completed in an ambulatory setting, the patient and/or parent/guardian has also been advised to contact their provider???s office for worsening conditions, and seek emergency medical treatment and/or call 911 if the patient deems either necessary.    Assessment:   Kenneth Fitzgerald is a 43 y.o., male with metastatic melanoma, h/o thyroid cancer, recent portal vein thrombosis, who presents upon referral of his oncologist for evaluation of depression and anxiety symptoms in setting of chronic medical issues and multiple severe chronic/subacute psychosocial stressors notably major legal issues and loss of employment/finances. He has  self-discontinued sertraline without adver effect or any changes in mood. His mood is lower in context of multiple large stressors.. He denies active SI and does not have immediate access to firearms.     We identified potential pharmacologic strategies for his symptoms but given his general preference to limit medications , which I support, we did not make changes today.  We can re-assess in two months post scans.      Risk Assessment:  A suicide and violence risk assessment was performed as part of this evaluation. There patient is deemed to be at chronic elevated risk for self-harm/suicide given the following factors: current diagnosis of depression, suicidal ideation or threats without a plan and past diagnosis of depression. There patient is deemed to be at chronic elevated risk for violence given the following factors: male gender. These risk factors are mitigated by the following factors:lack of active SI/HI. There is no acute risk for suicide or violence at this time. The patient was educated about relevant modifiable risk factors including following recommendations for treatment of psychiatric illness and abstaining from substance abuse. While future psychiatric events cannot be accurately predicted, the patient does not currently require  acute inpatient psychiatric care and does not currently meet [facility] involuntary commitment criteria.      Diagnoses:   Patient Active Problem List   Diagnosis   ??? Recurrent major depressive disorder (CMS-HCC)   ??? Malignant neoplasm of thyroid gland (CMS-HCC)   ??? Postoperative hypothyroidism   ??? Hypocalcemia   ??? Thyroid cancer (CMS-HCC)   ??? Melanoma (CMS-HCC)   ??? Ventral hernia with bowel obstruction        Stressors: cancer survivorship, financial, employment, social/relational, legal    Disability Assessment Scale: est 30     Plan:  1. Recurrent MDD, mild  - Discussed with patient the need for an overarching  treatment plan to assist him with his mood disorder and coping with stressors.  - Referral to my [facility] colleague Jill Alexanders Yopp - appts now established.  - At this point, sx burden not high enough that immediate antidepressant tx warranted. We discussed the availability of tx options including wellbutrin which could help with depression/fatigue/activation needs and/or pharmacogenomics trial if it is still open.  - I performed a suicide evaluation - [name] is now at lower risk. No active SI.  No immediate access to firearms.  Strong religious faith.    2. 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  If no change in sx can try stopping. If he finds it did have some sx benefit discussed consolidating dosing at night  - Also reviewed availability of duloxetine as tx option which is less sedating.    3. Fatigue  - No good treatment options for this, likely multifactorial.  - Referral to [facility]  - Sleep hygiene  - modafinil, stimulants, wellbutrin - all with no benefit for tx of cancer fatigue alone, possible benefit in patients with cooccuring MDD; at this point, no indication to trial today.      Follow-up with me in two months post scans.    I spent over 50% of 40 minutes face-to-face time with the patient providing counseling / coordination of care including discussion of risks and rationale for treatment recommendations for depression, cancer-fatigue and neuropathic pain.        Revised Medication(s) Post Visit:  Outpatient Encounter Medications as of 09/22/2018   Medication Sig Dispense Refill   ??? apixaban (ELIQUIS) 5 mg Tab Take 1 tablet (5 mg total) by mouth Two (2) times a day. 60 tablet 11   ??? binimetinib ([name]) 15 mg tablet Take 3 tablets (45 mg total) by mouth Two (2) times a day. 180 tablet 11   ??? blood sugar diagnostic Strp Dispense 100 blood glucose test strips, ok to sub any brand preferred by insurance/patient, use 3x/day; dispense whatever brand matches with meter. 100 strip 12   ??? blood-glucose meter kit Use as instructed; dispense 1 meter, whatever is preferred by insurance 1 each 1   ??? calcitrioL (ROCALTROL) 0.25 MCG capsule Take 1 capsule (0.25 mcg total) by mouth daily. 30 capsule 1   ??? calcium carbonate (OS-CAL) 500 mg calcium (1,250 mg) chewable tablet Chew 2,000 mg of elem calcium daily.      ??? encorafenib (BRAFTOVI) 75 mg capsule Take 6 capsules (450 mg total) by mouth daily. 180 capsule 11   ??? fexofenadine (ALLEGRA ALLERGY) 180 MG tablet Take 180 mg by mouth daily as needed.      ??? gabapentin (NEURONTIN) 300 MG capsule Take 1 capsule (300 mg total) by mouth two (2) times a day. 60 capsule 2   ??? lancets Misc Dispense 100 lancets, ok to sub any brand preferred by insurance/patient, use 3x/day 100 each 12   ??? levothyroxine (SYNTHROID) 125 MCG tablet TAKE (2 TABS) ON 5 DAYS/WEEK, AND TAKE 375 MCG (3 TABS) ON 2 DAYS/WEEK 64 tablet 1   ??? ondansetron (ZOFRAN) 8 MG tablet Take 1 tablet (8 mg total) by mouth every eight (8) hours as needed for nausea. 30 tablet 2   ??? oxyCODONE (ROXICODONE) 5 MG immediate release tablet Take 1 tablet (5 mg total) by mouth every eight (8) hours as needed for pain. 30 tablet 0     No facility-administered  encounter medications on file as of 09/22/2018.        Patient was provided with my contact information. He/she was instructed to call 911 for emergencies.     Thank you for allowing me to participate in the care of this patient  Toula Moos, MD    Subjective:  Patient interviewed by himself alone.  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  Has met w/ [facility] colleague Jill Alexanders Yopp once, next appt this week. Feels like they have established a good rapport and feels comfortable with him.    Experiencing some anxiety - manifests as avoidance but not his primary problem.  Working hard to keep himself socially engaged to help combat depressive symptoms.  Feels down but trying to keep himself busy.  Some anhedonia/reduced motivation but hard to disentangle from physical limitations and covid restrictions.  Struggles with fatigue and daytime sleepiness, can sometimes make work really challenging.  Uses gabapentin 300mg  BID for dental pain, trying to stay away from opioids.   Discussed strategies to address neuropathic pain in ways that could be less sedating for him.  He would like to find ways to reduce his fatigue 0 we discussed the limitations of pharmacologic interventions; lack of general evidence base for fatigue alone, mild evidence for benefit if fatigue is in part mediated by depression.    Ongoing legal problems.   Occ passive SI why am I alive?, no active intent or plan.  No access to firearms.  Trying hard to accept his current situation without bitterness.    Off zoloft.  Did not experience any adverse effects with this nor did he experience any changes in his mood with this though notes this coincided with finding new job.     Sleep is not great, +initial and maintenance insomnia - he is okay not intervening on this.  Not as physically active as he used to be. Appetite stable. No sx mania or psychosis.     Allergies:  Compazine [prochlorperazine] and Coconut    Medications:   Current Outpatient Medications   Medication Sig Dispense Refill   ??? apixaban (ELIQUIS) 5 mg Tab Take 1 tablet (5 mg total) by mouth Two (2) times a day. 60 tablet 11   ??? binimetinib ([name]) 15 mg tablet Take 3 tablets (45 mg total) by mouth Two (2) times a day. 180 tablet 11   ??? blood sugar diagnostic Strp Dispense 100 blood glucose test strips, ok to sub any brand preferred by insurance/patient, use 3x/day; dispense whatever brand matches with meter. 100 strip 12   ??? blood-glucose meter kit Use as instructed; dispense 1 meter, whatever is preferred by insurance 1 each 1   ??? calcitrioL (ROCALTROL) 0.25 MCG capsule Take 1 capsule (0.25 mcg total) by mouth daily. 30 capsule 1   ??? calcium carbonate (OS-CAL) 500 mg calcium (1,250 mg) chewable tablet Chew 2,000 mg of elem calcium daily.      ??? encorafenib (BRAFTOVI) 75 mg capsule Take 6 capsules (450 mg total) by mouth daily. 180 capsule 11   ??? fexofenadine (ALLEGRA ALLERGY) 180 MG tablet Take 180 mg by mouth daily as needed.      ??? gabapentin (NEURONTIN) 300 MG capsule Take 1 capsule (300 mg total) by mouth two (2) times a day. 60 capsule 2   ??? lancets Misc Dispense 100 lancets, ok to sub any brand preferred by insurance/patient, use 3x/day 100 each 12   ??? levothyroxine (SYNTHROID) 125 MCG  tablet TAKE (2 TABS) ON 5 DAYS/WEEK, AND TAKE 375 MCG (3 TABS) ON 2 DAYS/WEEK 64 tablet 1   ??? ondansetron (ZOFRAN) 8 MG tablet Take 1 tablet (8 mg total) by mouth every eight (8) hours as needed for nausea. 30 tablet 2   ??? oxyCODONE (ROXICODONE) 5 MG immediate release tablet Take 1 tablet (5 mg total) by mouth every eight (8) hours as needed for pain. 30 tablet 0     No current facility-administered medications for this visit.        Psychiatric/Medical History:  Past Medical History:   Diagnosis Date   ??? Cancer (CMS-HCC)     melanoma, thyroid cancer   ??? Disease of thyroid gland     hypothyroid   ??? Hypothyroidism    ??? Skin cancer        Surgical History:  Past Surgical History:   Procedure Laterality Date   ??? PR COLONOSCOPY W/BIOPSY SINGLE/MULTIPLE N/A 01/09/2018    Procedure: COLONOSCOPY, FLEXIBLE, PROXIMAL TO SPLENIC FLEXURE; WITH BIOPSY, SINGLE OR MULTIPLE;  Surgeon: Vonda Antigua, MD;  [facility];  Service: Gastroenterology   ??? PR IMPLANT MESH HERNIA REPAIR/DEBRIDEMENT CLOSURE N/A 01/03/2015    Procedure: IMPLANTATION OF MESH/OTHER PROSTHES INCISION/VENTRAL HERNIA REPAIR/MESH CLOSE DEBRID NECROT SOFT TIS INFECT;  Surgeon: Romero Belling, MD;  Location: MAIN OR [place];  Service: Gastrointestinal   ??? PR LAP, VENTRAL HERNIA REPAIR,REDUCIBLE N/A 04/07/2014    Procedure: LAPAROSCOPY, SURGICAL, REPAIR, VENTRAL, UMBILICAL, SPIGELIAN OR EPIGASTRIC HERNIA, REDUCIBLE;  Surgeon: Romero Belling, MD;  Location: MAIN OR [place] ;  Service: Gastrointestinal   ??? PR REMOVAL NODES, NECK,CERV CMPLT Left 12/07/2012    Procedure: CERVICAL LYMPHADENECTOMY (COMPLETE);  Surgeon: Charlott Rakes, MD; Location: [facility] OR [facility];  Service: Surgical Oncology   ??? PR REMOVAL NODES, NECK,CERV MOD RAD Right 03/29/2013    Procedure: CERVICAL LYMPHADENECTOMY (MODIFIED RADICAL NECK DISSECTION);  Surgeon: Charlott Rakes, MD;  Location: [facility];  Service: Surgical Oncology   ??? PR REPAIR RECURR INCIS HERNIA,REDUC N/A 01/03/2015    Procedure: REPAIR RECURRENT INCISIONAL OR VENTRAL HERNIA; REDUCIBLE;  Surgeon: Romero Belling, MD;  Location: MAIN OR [place];  Service: Gastrointestinal   ??? PR REPAIR RECURR INCIS HERNIA,STRANG N/A 06/21/2016    Procedure: REPAIR RECURRENT INCISIONAL OR VENTRAL HERNIA; INCARCERATED OR STRANGULATED;  Surgeon: Mickle Asper, MD;  Location: MAIN OR [place];  Service: Gastrointestinal   ??? PR SIGMOIDOSCOPY,FINE NEEDL BX,US GUIDED N/A 01/09/2018    Procedure: SIGMOIDOSCOPY, FLEXIBLE, W/TRANSENDOSCOPIC ULTRASOUND GUIDED NEEDLE ASPIRATION;  Surgeon: Vonda Antigua, MD;  Location: [facility];  Service: Gastroenterology       Social History:  Social History     Socioeconomic History   ??? Marital status: Single     Spouse name: Not on file   ??? Number of children: Not on file   ??? Years of education: Not on file   ??? Highest education level: Not on file   Occupational History   ??? Not on file   Social Needs   ??? Financial resource strain: Not on file   ??? Food insecurity     Worry: Not on file     Inability: Not on file   ??? Transportation needs     Medical: Not on file     Non-medical: Not on file   Tobacco Use   ??? Smoking status: Never Smoker   ??? Smokeless tobacco: Never Used   Substance and Sexual Activity   ??? Alcohol use:  Yes     Comment: social drinker    ??? Drug use: No   ??? Sexual activity: Not on file   Lifestyle   ??? Physical activity     Days per week: Not on file     Minutes per session: Not on file   ??? Stress: Not on file   Relationships   ??? Social Wellsite geologist on phone: Not on file     Gets together: Not on file     Attends religious service: Not on file     Active member of club or organization: Not on file     Attends meetings of clubs or organizations: Not on file     Relationship status: Not on file   Other Topics Concern   ??? Not on file   Social History Narrative   ??? Not on file       Family History:  The patient's family history includes Arrhythmia in his mother; Colon cancer in his paternal grandmother; Coronary artery disease in his father; Diabetes in his father; Hypertension in his father; Hyperthyroidism in his mother; Osteoporosis in his mother; Prostate cancer in his father; Squamous cell carcinoma in his mother..    ROS: The balance of 10 systems was reviewed with the patient and is negative except for the following: fatigue, interrupted sleep, dizziness, depressed mood    Objective:     Vitals:   There were no vitals filed for this visit.    Mental Status Exam:  Appearance:    Appears stated age   Motor:   No abnormal movements   Speech/Language:    Normal rate, volume, tone, fluency   Mood:   it's been hard   Affect:    More reactive than previously.   Thought process:   Logical, linear, clear, coherent, goal directed   Thought content:     Denies SI, HI, self harm, delusions, obsessions, paranoid ideation, or ideas of reference. Future oriented.   Perceptual disturbances:     Denies auditory and visual hallucinations, behavior not concerning for response to internal stimuli     Orientation:   Oriented to person, place, time, and general circumstances   Attention:   Able to fully attend without fluctuations in consciousness   Concentration:   Able to fully concentrate and attend   Memory:   Immediate, short-term, long-term, and recall grossly intact    Fund of knowledge:    Consistent with level of education and development   Insight:     Intact   Judgment:    Intact   Impulse Control:   Intact     PE:   General: in NAD  Neuro: CN II-XII intact.  No abnormal movements.  Gait assessment deferred.    Test Results:  Data Review: Lab results last 24 hours:    No results found for this or any previous visit (from the past 24 hour(s)).  Imaging: Radiology report(s) reviewed.      Toula Moos, MD  09/21/2018

## 2018-09-23 ENCOUNTER — Telehealth: Admit: 2018-09-23 | Discharge: 2018-09-24

## 2018-09-23 ENCOUNTER — Telehealth: Admit: 2018-09-23 | Discharge: 2018-09-24 | Attending: Clinical | Primary: Clinical

## 2018-09-23 DIAGNOSIS — C73 Malignant neoplasm of thyroid gland: Secondary | ICD-10-CM

## 2018-09-23 DIAGNOSIS — E890 Postprocedural hypothyroidism: Secondary | ICD-10-CM

## 2018-09-23 DIAGNOSIS — F330 Major depressive disorder, recurrent, mild: Secondary | ICD-10-CM

## 2018-09-23 NOTE — Unmapped (Signed)
ENDOCRINE/DIABETES TELEVISIT (VIDEO VISIT)    I spent 17 minutes on the real-time audio and video with the patient. I spent an additional 8 minutes on pre- and post-visit activities.     The patient was physically located in [place] or a state in which I am permitted to provide care. The patient and/or parent/guardian understood that s/he may incur co-pays and cost sharing, and agreed to the telemedicine visit. The visit was reasonable and appropriate under the circumstances given the patient's presentation at the time.    The patient and/or parent/guardian has been advised of the potential risks and limitations of this mode of treatment (including, but not limited to, the absence of in-person examination) and has agreed to be treated using telemedicine. The patient's/patient's family's questions regarding telemedicine have been answered.     If the visit was completed in an ambulatory setting, the patient and/or parent/guardian has also been advised to contact their provider???s office for worsening conditions, and seek emergency medical treatment and/or call 911 if the patient deems either necessary.        Assessment/Plan:    1. T1N1b papillary thyroid cancer (classic/follicular variant), multifocal, s/p 3 surgeries and 2 doses of I-131. Last treatment was in 2015. Has indeterminate response to treatment based on persistent detectable Tg (< 1 on suppression, rose to 2.4 with stimulation in Jan 2018).  Note that he also has stage IV melanoma, followed by oncology.  -Thyrogen-stimulated WBS was negative in 01/2016; stimulated Tg was 2.4  -Tg on suppression stable, last check 06/2018 and was 0.2 at that time with negative antibody.  Repeat 06/2019 unless clinically indicated sooner.  -12/2017 [place] and neck CT did not suggest evidence of structural thyroid disease in the neck, repeat US in the next few months, order placed today, he may have it done 10/2018 when he goes for his other scans for his melanoma or wait until early 2021    2. Hypothyroidism, with TSH fluctuating between 0.06 and 23 in 2017-2018 on same doses of thyroid hormone, perhaps related to wt changes, absorption issues, and/or variable adherence at that time  - TFTs within goal by last check on last few checks, including 06/2018, repeat TFTs in 10/2018, orders placed  -He has been counseled about appropriate administration, and reviewed factors that can affect dose needs for laboratory measurement    3. Hypocalcemia, severe, started calcitriol 05/2015. Low calcium in 12/2017 d/t low calcium intake at that time. Normal level in 05/2018 and in 06/2018 off of calcitriol, but significantly low again in 07/2018.  -Taking calcitriol 0.25 mcg daily on a regular basis now, without hypocalcemia symptoms reported.  Also taking a good bit of calcium supplementation but taking all at once in the evening because he cannot remember twice daily or 3 times daily dosing.  -Check calcium with upcoming labs in 10/2018, goal values reviewed as well as risks of hypocalcemia or iatrogenic hypercalcemia.     4. Hyperglycemia, new in 03/2018, in the setting of 4 weeks of prednisone to treat a reaction to immunotherapy.  A1c 6.0% in 06/2018.  -Fingerstick glucose values are generally below 120 on rotating checks, same on recent chemistry panels.  -Repeat A1c after next visit.    Return in about 3 months (around 12/23/2018) for Video Visit.     Labs today:  Orders Placed This Encounter   Procedures   ??? US Soft Tissue Head And Neck   ??? TSH   ??? T4, Free   ??? Calcium   ???  Phosphorus Level   ??? Albumin   ??? Creatinine               Reason For Visit:   Chief Complaint   Patient presents with   ??? Follow-up thyroid cancer and hypoparathyroidism      Subjective:     History of Present Illness:  Kenneth Fitzgerald is a 43 y.o. male with PTC who was last seen 06/2018.  Most labs stable and/or at goal at that time, including calcium.  He had been off of calcitriol 0.25 mcg daily for about a month at that time, and was not having symptoms.  Therefore we advised he could remain off calcitriol for now with close observation.  A1c was moderately elevated at 6%, advised about monitoring and blood glucose goals.    Taking LT4 250 mcg x 5 days/week, and 375 mcg x 2 days/week. Good adherence. Takes in AM on empty stomach. No biotin supplements. No overt hypothyroid symptoms. No neck pain, swelling, globus, dysphagia, or dysphonia.    Pt restarted calcitriol ~ 3-4 weeks ago because a clinical pharmacist (assoc'd with Onc clinic?) recommended restarting it due to low calcium levels in July, improved on recheck in August. Taking supplemental calcium as well, about 1500 mg/day, all at once in evening b/c easier to remember. Calcium intake is milk. Has vit D in his ca supplements. No kidney stones. No hypoCa symptoms.     Taking oral medication at present for the melanoma. Was advised cancer was spreading by last set of scans. Due for scans again in 10/2018.    Very low energy, when I get home from work I am done ie not feeling enough energy to do anything beyond the basics.  No hyperglycemia symptoms, in fact appetite has been a little lower and he is only eating 1 main meal per day.  No nausea or vomiting.  Blood sugars are in the 120s or lower on random fingerstick checks.    Weight trend:  Wt Readings from Last 6 Encounters:   07/29/18 (!) 144 kg (317 lb 6.4 oz)   06/22/18 (!) 140.6 kg (310 lb)   05/13/18 (!) 139.3 kg (307 lb 1.6 oz)   03/12/18 (!) 137.5 kg (303 lb 2.1 oz)   03/12/18 (!) 137.3 kg (302 lb 11.2 oz)   02/12/18 (!) 139.2 kg (306 lb 14.1 oz)       PMH:  1. 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 Thyroidectomy 06/25/2011 with 1 foci each lobe and 2/2 central LN (left Level 6)     1b. Thyrogen-based RAI 125 mCi 03/2012 with pre/post scans showing fairly large focus of bed activity and 4.2% uptake; pretx stimulated Tg of 73     1c. Recurrence on [place] 09/2012 in left thyroid bed and confirmed by FNA 10/2012; left ND in 11/2012 levels 3-6 with 12/16 positive (largest 1.8 cm, and some with extranodal extension)     1d. Right CND 03/2013 levels 2-4 with 3/20 positive (largest 3.5 mm), found incidentally as the right ND was performed for melanoma recurrence which was 1 cm node in 1 of 20 nodes     1e. Thyrogen-based 150 mCi in 05/2013 [LID as well before the treatment]      9f. Thyrogen-stimulated Tg was 2.4 in 01/2016. WBS in 01/2016 was negative.  2. Postsurgical hypothyroidism  3. Stage 3 melanoma - scalp lesion, surgery including RND, and systemic therapy ending 01/2012; recurrence in a 1 cm right  LN removed 03/2013 on repeat RND; XRT completed 07-11-13  4. Diverticulitis  5. H/o anklyosing spondylitis 12-Jul-2007     PSH reviewed in Epic      Current Outpatient Medications:   ???  apixaban (ELIQUIS) 5 mg Tab, Take 1 tablet (5 mg total) by mouth Two (2) times a day., Disp: 60 tablet, Rfl: 11  ???  binimetinib ([name]) 15 mg tablet, Take 3 tablets (45 mg total) by mouth Two (2) times a day., Disp: 180 tablet, Rfl: 11  ???  blood sugar diagnostic Strp, Dispense 100 blood glucose test strips, ok to sub any brand preferred by insurance/patient, use 3x/day; dispense whatever brand matches with meter., Disp: 100 strip, Rfl: 12  ???  blood-glucose meter kit, Use as instructed; dispense 1 meter, whatever is preferred by insurance, Disp: 1 each, Rfl: 1  ???  calcitrioL (ROCALTROL) 0.25 MCG capsule, Take 1 capsule (0.25 mcg total) by mouth daily., Disp: 30 capsule, Rfl: 1  ???  calcium carbonate (OS-CAL) 500 mg calcium (1,250 mg) chewable tablet, Chew 2,000 mg of elem calcium daily. , Disp: , Rfl:   ???  encorafenib (BRAFTOVI) 75 mg capsule, Take 6 capsules (450 mg total) by mouth daily., Disp: 180 capsule, Rfl: 11  ???  fexofenadine (ALLEGRA ALLERGY) 180 MG tablet, Take 180 mg by mouth daily as needed. , Disp: , Rfl:   ???  gabapentin (NEURONTIN) 300 MG capsule, Take 1 capsule (300 mg total) by mouth two (2) times a day., Disp: 60 capsule, Rfl: 2  ???  lancets Misc, Dispense 100 lancets, ok to sub any brand preferred by insurance/patient, use 3x/day, Disp: 100 each, Rfl: 12  ???  levothyroxine (SYNTHROID) 125 MCG tablet, TAKE (2 TABS) ON 5 DAYS/WEEK, AND TAKE 375 MCG (3 TABS) ON 2 DAYS/WEEK, Disp: 64 tablet, Rfl: 1  ???  ondansetron (ZOFRAN) 8 MG tablet, Take 1 tablet (8 mg total) by mouth every eight (8) hours as needed for nausea., Disp: 30 tablet, Rfl: 2  ???  oxyCODONE (ROXICODONE) 5 MG immediate release tablet, Take 1 tablet (5 mg total) by mouth every eight (8) hours as needed for pain., Disp: 30 tablet, Rfl: 0      Allergies   Allergen Reactions   ??? Compazine [Prochlorperazine] Itching   ??? Coconut Nausea And Vomiting       ROS  No chest pain or SOB. No f/c. Remainder of ten systems reviewed were negative except as noted in the history of present illness.    Social Hx:  -Living in [place].  -Working at [employer], a [employer]ar[employer] company.  Drives a tow truck.  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 Hyperthyroidism Mother    ??? Osteoporosis Mother    ??? Arrhythmia Mother    ??? Squamous cell carcinoma Mother         basal cell vs squamous cell skin cancer   ??? Coronary artery disease Father         s/p CABG   ??? Diabetes Father    ??? Hypertension Father    ??? Prostate cancer Father    ??? Colon cancer Paternal Grandmother    ??? Thyroid disease Neg Hx    Father died Feb 10, 2017 with diabetes and heart disease  Mother died in 07/11/12    Objective:     Physical Exam:  There were no vitals taken for this  visit.   BP Readings from Last 3 Encounters:   07/29/18 133/80   06/22/18 143/83   03/12/18 140/82       General appearance - pleasant, conversant, NAD; voice normal  Neck - supple on gross visualization, healed neck scar  Eyes -  No stare or lid edema  Chest - nL WOB  Skin - no visible facial rashes    06/2018 in person exam:  General appearance - Conversant, pleasant, in no distress.  Generalized and abdominal obesity.  Wearing a mask.  Eyes -  No lid lag or stare, no proptosis  Neck - Supple w/o palpable LAD, no masses appreciated. Well healed incision from thyx.and previous LND.   Lymphatics - no cervical or supraclavicular adenopathy appreciated   Resp - clear to auscultation bilaterally  CV - normal rate, regular rhythm  Neurological - no hand tremors, 2+ upper extremity DTRs  Extremities - peripheral pulses normal, no lower extremity edema  Skin - warm, dry, no visible rashes.  No acanthosis.    Data Review:    TSH   Date Value   06/22/2018 0.352 uIU/mL (L)   05/13/2018 0.143 uIU/mL (L)   03/12/2018 0.020 uIU/mL (L)   10/04/2013 0.11 u[iU]/mL (L)   06/28/2013 0.28 u[iU]/mL (L)   04/27/2013 0.09 u[iU]/mL (L)     Free T4 (ng/dL)   Date Value   16/10/9602 1.18   05/13/2018 2.05 (H)   03/12/2018 1.71 (H)   10/04/2013 1.55 (H)   06/28/2013 1.40   04/27/2013 1.69 (H)     THYROGLOBULIN AB (IU/mL)   Date Value   06/22/2018 <1.8   12/17/2017 <1.8   02/27/2017 <1.8     Thyroglobulin, Tumor Marker, IA (ng/mL)   Date Value   06/22/2018 0.2 (H)   12/17/2017 0.3 (H)   02/27/2017 0.4 (H)      Lab Results   Component Value Date    CALCIUM 8.2 (L) 09/03/2018    CALCIUM 6.9 (L) 07/29/2018    CALCIUM 8.8 06/22/2018    PHOS 5.0 (H) 06/22/2018    PHOS 4.7 02/27/2017    PHOS 5.2 (H) 07/03/2016    CREATININE 1.27 09/03/2018    CREATININE 1.13 07/29/2018    CREATININE 0.93 06/22/2018    VITDTOTAL 31.0 02/27/2017    VITDTOTAL 37 12/04/2015    VITDTOTAL 34 12/12/2014    PTH 9.9 (L) 12/04/2015    PTH 12.4 12/12/2014    PTH 12 04/27/2013     Lab Results   Component Value Date    A1C 6.0 (H) 06/22/2018     Calcium values while hospitalized in 06/2016 were low at less than 7, and had low ionized calcium as well.     01/2016 Thyrogen stimulated testing  - Tg rose to 2.4.    PATH from 03/2013 right neck dissection:  Amended: 03/31/2013 by Kirkland Hun, MD  Reason: Additional Information  Comment: The report is amended to add the results of a BRAF V600E immunostain.  The final diagnosis is unchanged.  Previous Signout Date: 03/30/2013  Diagnosis:  Lymph nodes, right cervical, levels 2-4, removal and partial parotidectomy  -Metastatic melanoma involving 1 out of 20 lymph nodes, with the largest diameter measuring 10 mm (1.0 cm) and no evidence of extracapsular extension (1/20)  -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, with the largest diameter measuring 3.5 mm and no evidence of extracapsular extension (3/20)   -BRAF V600E immunohistochemical staining is positive (3+ staining, 100% of  cells) --> this was on LN with melanoma  -Benign parotid gland is also present  *Molecular testing:  RESULTS:  Gene Variants of Known Clinical Utility:   BRAF: No mutation detected (see note) --> A BRAF V600E immunohistochemical stain was performed on the tissue section of this case and was positive (see case [id number]). Though within validated parameters, the tumor input for sequencing in this case was low. This could result in the V600E mutant allele frequency falling below the limit of detection for the assay. [Again on LN with melanoma]  KIT: No mutation detected     Radiology:    07/2018???had MRI of the abdomen, CT chest with contrast, CT abdomen with contrast, MRI brain with contrast    Results for orders placed during the hospital encounter of 12/17/17   US Soft Tissue Head And Neck    Narrative EXAM: US SOFT TISSUE HEAD AND NECK  DATE: 12/17/2017 10:06 AM  ACCESSION: [id number]  DICTATED: 12/17/2017 10:07 AM  INTERPRETATION LOCATION: [facility]    CLINICAL INDICATION: 43 year old Male with thyroid cancer  - C73 - Thyroid cancer (CMS - HCC)     COMPARISON: Thyroid ultrasound from 01/01/2017    TECHNIQUE:  Ultrasound views of the thyroid were obtained using gray scale and limited color Doppler imaging.    FINDINGS:  Status post total thyroidectomy. No residual thyroid tissue is visualized in the thyroidectomy bed. Heterogeneous hypoechoic nodule in the midline, superior to the thyroidectomy bed is again noted measuring 1.1 x 1.0 x 0.6 cm. There has been apparent interval increase in the soft tissue component of this nodule.    Few subcentimeter lymph nodes in bilateral neck is identified with largest measuring 0.4 cm in left level II, previously 0.9 cm.        Impression - Status post total thyroidectomy. Unchanged size of 1.1 cm a heterogeneous nodule in the midline superior to the thyroidectomy bed, although with slightly increased soft tissue component. Short interval follow-up or FNA could be considered.    - Interval decrease in size of subcentimeter lymph nodes in bilateral neck with largest measuring 0.4 cm in left level II, previously 0.9 cm.       01/2016 Whole body scan:  FINDINGS: ??There are no abnormal foci of increased radiotracer uptake identified. ??There is physiologic uptake seen in the nasopharynx, the bowel, stomach, and bladder.  Impression  No evidence of I-131 avid metastasis.    05/2013 had stimulated PET and WBS, as well as 1 week post treatment WBS     Other Medical Data:    Reviewed interim relevant HemOnc notes.

## 2018-10-06 MED FILL — MEKTOVI 15 MG TABLET: 15 days supply | Qty: 90 | Fill #4 | Status: AC

## 2018-10-06 MED FILL — BRAFTOVI 75 MG CAPSULE: 15 days supply | Qty: 90 | Fill #4 | Status: AC

## 2018-10-06 MED FILL — MEKTOVI 15 MG TABLET: ORAL | 15 days supply | Qty: 90 | Fill #4

## 2018-10-06 MED FILL — BRAFTOVI 75 MG CAPSULE: ORAL | 15 days supply | Qty: 90 | Fill #4

## 2018-10-06 NOTE — Unmapped (Signed)
[facility] Refill Coordination Note    Specialty Medication(s) to be Shipped:   Hematology/Oncology: Kenneth Fitzgerald and Kenneth Fitzgerald    Other medication(s) to be shipped: n/a     Kenneth Fitzgerald, DOB: November 22, 1975  Phone: [phone] (home)       All above HIPAA information was verified with patient's caregiver.     Completed refill call assessment today to schedule patient's medication shipment from the [facility] [phone]).       Specialty medication(s) and dose(s) confirmed: Regimen is correct and unchanged.   Changes to medications: Kenneth Fitzgerald no changes at this time.  Changes to insurance: No  Questions for the pharmacist: No    Confirmed patient received Welcome Packet with first shipment. The patient will receive a drug information handout for each medication shipped and additional FDA Medication Guides as required.       DISEASE/MEDICATION-SPECIFIC INFORMATION        N/A    SPECIALTY MEDICATION ADHERENCE     Medication Adherence    Patient reported X missed doses in the last month: 5  Specialty Medication: Braftovi  Patient is on additional specialty medications: Yes  Additional Specialty Medications: [name]  Patient Reported Additional Medication X Missed Doses in the Last Month: 5  Patient is on more than two specialty medications: No                Braftovi 75 mg: 0 days of medicine on hand   [name] 15 mg: 0 days of medicine on hand       SHIPPING     Shipping address confirmed in Epic.     Delivery Scheduled: Yes, Expected medication delivery date: 10/07/18.     Medication will be delivered via UPS to the home address in Epic [place].    Anberlyn Feimster  Anders Grant   [facility] Specialty Pharmacist

## 2018-10-08 ENCOUNTER — Telehealth: Admit: 2018-10-08 | Discharge: 2018-10-09 | Attending: Clinical | Primary: Clinical

## 2018-10-08 DIAGNOSIS — F330 Major depressive disorder, recurrent, mild: Secondary | ICD-10-CM

## 2018-10-08 NOTE — Unmapped (Signed)
[name]  Psychiatry  Psychotherapy Note - Telehealth via Phone    Service Date: October 08, 2018  Service: 30 minutes of cognitive-behavioral psychotherapy via phone session  Time with Patient: 30 minutes    Encounter Description: This encounter was conducted from provider's home office via EPIC video session due to COVID-19 pandemic. Kenneth Fitzgerald [facility] was located in his home. Rationale for phone session is need to social distance. See Plan for telemedicine consent/disclaimer.     Encounter Description/Consent:   Kenneth Fitzgerald visit was completed through telehealth encounter (phone).       This patient encounter is appropriate and reasonable under the circumstances. The patient has been advised of the potential risks and limitations of this mode of treatment (including, but not limited to, the absence of in-person examination) and has agreed to be treated in a remote fashion in spite of them. Any and all of the patient's/patient's family's questions on this issue have been answered.     I notified him that because this is a special type of visit, he may get a bill for a copay or coinsurance. He is OK with proceeding.       Time Spent: 50 minutes    Assessment:  Kenneth Fitzgerald is a 43yo male with metastatic melanoma, h/o thyroid cancer, recent portal vein thrombosis, who I see in therapy to address depression and enhance coping with illness-related stressors.  Due to problems with his video link, today's session was conducted via phone.  Pt endorsed depressive symptoms, namely anhedonia, poor motivation, and frustration re: events from his recent past. Pt denied SI and does not have immediate access to firearms.  Psychotherapy and recommendations offered.  Will continue to see every two weeks.??  ??  Risk Assessment:  A suicide and violence risk assessment was performed as part of this evaluation. There patient is deemed to be at chronic elevated risk for self-harm/suicide given the following factors: current diagnosis of depression, suicidal ideation or threats without a plan and past diagnosis of depression. There patient is deemed to be at chronic elevated risk for violence given the following factors: male gender. These risk factors are mitigated by the following factors:lack of active SI/HI. There is no acute risk for suicide or violence at this time. The patient was educated about relevant modifiable risk factors including following recommendations for treatment of psychiatric illness and abstaining from substance abuse. While future psychiatric events cannot be accurately predicted, the patient does not currently require acute inpatient psychiatric care and does not currently meet [facility] involuntary commitment criteria.             Plan:  1. I will continue to see pt for psychotherapy.  Next session is scheduled for two weeks.  2. Pt has information on available [facility] resources.  3. Pt followed by Dr. Lawernce Keas of Psychiatry.  4. Pt has my contact information and knows to reach me as needed.    Subjective:   Pt presented as alert and oriented.  As before, he was engaged and cooperative throughout the session.  Due to problems with his video link, today's session was only 30 minutes and was conducted via phone.     Pt endorsed continued depressive symptoms including some poor mood, some sleep difficulties, and anhedonia.  Pt denies hopelessness or active SI/HI.  No passive SI since our last session.  He reported that he has some days in which I just feel like they get stuck in the past,  and laments certain choices that he made and perseverates on how his life just is completely upside down from out was a few years ago.  Pt reported that he does struggle with motivation during these times, although has not missed work or other responsibilities.    Pt reported that he usually tries to think of something positive when he notices that he is feeling down for a prolonged period of time he also is making an active effort to be socially engaged with some friends and with family members with whom he remains close.  Toward this end, he is looking forward to the next several days which she will be spending with his brother out of state.  Time was spent discussing and problem-solving other behavioral and cognitive steps to alleviate depressive symptoms.  I also introduced pt to the Dual Process Model, which while created to contextual lives bereavement, I believe captures his struggle between looking back on all that was lost in trying to move forward (especially with a poor prognosis).  I believe this resonated to some degree, and will continue to use this in our discussions.  Time was again spent discussing his thoughts for the future, and his desire to not wait just to die.    I provided support and understanding.  We discussed and processed the pt.'s thoughts and feelings in considerable depth.  Time was spent problem-solving approaches to alleviating poor mood and enhancing adaptive coping, as well as working with him to re-imagine his immediate future.  Pt was engaged and receptive.    We will see in two weeks.    Relevant Psych Medications: none currently    Mental Status Exam:  Appearance:    Appears stated age   Motor:   No abnormal movements   Speech/Language:    Normal rate, volume, tone, fluency   Mood:   its been a little up and down   Affect:   pleasant; cooperative   Thought process:   Logical, linear, clear, coherent, goal directed   Thought content:     Denies SI, HI, self harm   Perceptual disturbances:     Behavior not concerning for response to internal stimuli     Orientation:   Oriented to person, place, time, and general circumstances   Attention:   Able to fully attend without fluctuations in consciousness   Concentration:   Able to fully concentrate and attend   Memory:   Immediate, short-term, long-term, and recall grossly intact    Fund of knowledge:    Consistent with level of education and development   Insight:     Intact   Judgment:    Intact   Impulse Control:   Intact     Diagnosis: Recurrent major depressive disorder    Kenneth Fitzgerald. Kenneth Glassco PhD  October 08, 2018

## 2018-10-08 NOTE — Unmapped (Signed)
[facility]  Psychiatry  Psychotherapy Note - Telehealth via Video    Service Date: September 10, 2018  Service: 50 minutes of cognitive-behavioral psychotherapy via video session  Time with Patient: 50 minutes    Encounter Description: This encounter was conducted from provider's home office via EPIC video session due to COVID-19 pandemic. [name] was located in his home. Rationale for video session is need to social distance. See Plan for telemedicine consent/disclaimer.     Encounter Description/Consent:   Kenneth Fitzgerald [facility] visit was completed through telehealth encounter (video).       This patient encounter is appropriate and reasonable under the circumstances. The patient has been advised of the potential risks and limitations of this mode of treatment (including, but not limited to, the absence of in-person examination) and has agreed to be treated in a remote fashion in spite of them. Any and all of the patient's/patient's family's questions on this issue have been answered.     I notified him that because this is a special type of visit, he may get a bill for a copay or coinsurance. He is OK with proceeding.       Time Spent: 50 minutes    Assessment:  Kenneth Fitzgerald is a 43yo male with metastatic melanoma, h/o thyroid cancer, recent portal vein thrombosis, who presents upon referral from Dr. Willaim Bane of Psychiatry for therapy to address depression and anxiety in setting of chronic medical issues and multiple psychosocial stressors (e.g., sig legal issues, loss of employment, poor finances).   Pt endorsed mild depressive symptoms as related to above stressors and the overall upheaval in his life in recent years; denied active SI and does not have immediate access to firearms.  I recommended psychotherapy moving forward; patient was receptive and we spent time discussing goals of our work together.  Will see in two weeks.??  ??  Risk Assessment:  A suicide and violence risk assessment was performed as part of this evaluation. There patient is deemed to be at chronic elevated risk for self-harm/suicide given the following factors: current diagnosis of depression, suicidal ideation or threats without a plan and past diagnosis of depression. There patient is deemed to be at chronic elevated risk for violence given the following factors: male gender. These risk factors are mitigated by the following factors:lack of active SI/HI. There is no acute risk for suicide or violence at this time. The patient was educated about relevant modifiable risk factors including following recommendations for treatment of psychiatric illness and abstaining from substance abuse. While future psychiatric events cannot be accurately predicted, the patient does not currently require acute inpatient psychiatric care and does not currently meet [facility] involuntary commitment criteria.             Plan:  1. I will continue to see pt for psychotherapy.  Next session is scheduled for two weeks.  2. Pt has information on available CCSP resources.  3. Pt followed by Dr. Lawernce Keas of Psychiatry who he is scheduled to see next week.  4. Pt has my contact information and knows to reach me as needed.    Subjective:   Pt presented as alert and oriented.  He was engaged and cooperative throughout the session.    Given that this was our first meeting, considerable time was spent gathering background information, appreciating the context for pt's current struggles, and building therapeutic rapport. Kenneth Fitzgerald described the numerous ways in which his life has been turned upside  down in recent years.  Namely, this includes his cancer diagnosis and treatment, significant legal issues that continue to this day, the loss of some friends and social support network due to those legal issues, the loss of a career that he very much enjoyed and found rewarding, poor prognosis, and financial stressors.  My sense was that Kenneth Fitzgerald was open and candid about these issues in his emotional responses ??? past and current.    Pt endorsed overall mild depressive symptoms, including poor mood, some sleep difficulties, and some anhedonia.  He denies hopelessness or active SI/HI.  He does acknowledge some occasional passive SI, but denies any active intent or plan.    Pt reported that there was cancer progression on a recent scan, which led to him beginning a new treatment regimen.  He described the side effects of this regimen thus far as being not too bad, although does lead to some fatigue.    Pt is currently working as a [employer] architect.  Although he does not terribly enjoy his job and finds it far less rewarding than the career he had several years ago, nevertheless he tends to throw myself into his job as a way to distract himself from other stressors.    Kenneth Fitzgerald reported that he recently discontinued zoloft. He denies adverse effects including withdrawal sx or worsening in mood, and following up with Dr. Willaim Bane.     I provided support and understanding.  We discussed and processed the pt.'s thoughts and feelings in considerable depth.  Although the main focus of today was gathering background information and establishing rapport, some time was spent problem-solving approaches to alleviating poor mood and enhancing adaptive coping.  Pt was engaged and receptive.    We will see in two weeks.    Relevant Psych Medications: none currently    Mental Status Exam:  Appearance:    Appears stated age   Motor:   No abnormal movements   Speech/Language:    Normal rate, volume, tone, fluency   Mood:   good, thank you   Affect:   pleasant; cooperative   Thought process:   Logical, linear, clear, coherent, goal directed   Thought content:     Denies SI, HI, self harm   Perceptual disturbances:     Behavior not concerning for response to internal stimuli     Orientation:   Oriented to person, place, time, and general circumstances   Attention:   Able to fully attend without fluctuations in consciousness   Concentration:   Able to fully concentrate and attend   Memory:   Immediate, short-term, long-term, and recall grossly intact    Fund of knowledge:    Consistent with level of education and development   Insight:     Intact   Judgment:    Intact   Impulse Control:   Intact     Diagnosis: Recurrent major depressive disorder    [name]. Dupree Givler PhD  October 08, 2018

## 2018-10-09 NOTE — Unmapped (Signed)
[facility]  Psychiatry  Psychotherapy Note - Telehealth via Video    Service Date: September 23, 2018  Service: 50 minutes of cognitive-behavioral psychotherapy via video session  Time with Patient: 50 minutes    Encounter Description: This encounter was conducted from provider's home office via EPIC video session due to COVID-19 pandemic. Kenneth [facility] was located in his home. Rationale for video session is need to social distance. See Plan for telemedicine consent/disclaimer.     Encounter Description/Consent:   Kenneth Fitzgerald [facility] visit was completed through telehealth encounter (video).       This patient encounter is appropriate and reasonable under the circumstances. The patient has been advised of the potential risks and limitations of this mode of treatment (including, but not limited to, the absence of in-person examination) and has agreed to be treated in a remote fashion in spite of them. Any and all of the patient's/patient's family's questions on this issue have been answered.     I notified him that because this is a special type of visit, he may get a bill for a copay or coinsurance. He is OK with proceeding.       Time Spent: 50 minutes    Assessment:  Kenneth Fitzgerald is a 43yo male with metastatic melanoma, h/o thyroid cancer, recent portal vein thrombosis, who I see in therapy to address depression and enhance coping with illness-related stressors.  Pt again endorsed mild depressive symptoms; denied active SI and does not have immediate access to firearms.  Kenneth Fitzgerald was relieved by recent scan results, and is focusing on how to maximize his time.  Psychotherapy and recommendations offered.  Will continue to see every two weeks.??  ??  Risk Assessment:  A suicide and violence risk assessment was performed as part of this evaluation. There patient is deemed to be at chronic elevated risk for self-harm/suicide given the following factors: current diagnosis of depression, suicidal ideation or threats without a plan and past diagnosis of depression. There patient is deemed to be at chronic elevated risk for violence given the following factors: male gender. These risk factors are mitigated by the following factors:lack of active SI/HI. There is no acute risk for suicide or violence at this time. The patient was educated about relevant modifiable risk factors including following recommendations for treatment of psychiatric illness and abstaining from substance abuse. While future psychiatric events cannot be accurately predicted, the patient does not currently require acute inpatient psychiatric care and does not currently meet [facility] involuntary commitment criteria.             Plan:  1. I will continue to see pt for psychotherapy.  Next session is scheduled for two weeks.  2. Pt has information on available CCSP resources.  3. Pt followed by Dr. Lawernce Keas of Psychiatry.  4. Pt has my contact information and knows to reach me as needed.    Subjective:   Pt presented as alert and oriented.  As before, he was engaged and cooperative throughout the session.    Pt endorsed continued depressive symptoms including poor mood, some sleep difficulties, and some anhedonia.  Symptom intensity was characterized as mild.   Pt denies hopelessness or active SI/HI.  No passive SI since our last session.    Time was spent discussing and problem solving behavioral and cognitive steps to alleviate mild depressive symptoms.  Much of the conversation was focused on pt's aim for the future, specifically how he wishes to maximize  what ever time I have left.  He reported that he recently received good results from his scans, which he feels gives me a little better breathing room.  He characterized his overarching goal for the near future as wanting to engage in meaningful activities and I do not want to spend my time just waiting to die.    Considerable time was spent discussing his legacy, and how he wants you to remember him.  He also listed a number of bucket list items, specifically traveling or potentially living abroad.  However his ongoing legal issues and lawsuits make this not financially  Or legally viable at this time.  This dynamic is a contributing factor to Kenneth Fitzgerald frustration, and makes it difficult for him to move past some of the stressors from the last several years.    I provided support and understanding.  We discussed and processed the pt.'s thoughts and feelings in considerable depth.  Time was spent problem-solving approaches to alleviating poor mood and enhancing adaptive coping, as well as working with him to re-imagine his immediate future.  Pt was engaged and receptive.    We will see in two weeks.    Relevant Psych Medications: none currently    Mental Status Exam:  Appearance:    Appears stated age   Motor:   No abnormal movements   Speech/Language:    Normal rate, volume, tone, fluency   Mood:   ok   Affect:   pleasant; cooperative   Thought process:   Logical, linear, clear, coherent, goal directed   Thought content:     Denies SI, HI, self harm   Perceptual disturbances:     Behavior not concerning for response to internal stimuli     Orientation:   Oriented to person, place, time, and general circumstances   Attention:   Able to fully attend without fluctuations in consciousness   Concentration:   Able to fully concentrate and attend   Memory:   Immediate, short-term, long-term, and recall grossly intact    Fund of knowledge:    Consistent with level of education and development   Insight:     Intact   Judgment:    Intact   Impulse Control:   Intact     Diagnosis: Recurrent major depressive disorder    [name]. Odessa Morren PhD  October 08, 2018

## 2018-10-12 NOTE — Unmapped (Signed)
Addended by: [name] on: 10/12/2018 10:50 AM     Modules accepted: Level of Service

## 2018-10-16 NOTE — Unmapped (Signed)
This patient has been disenrolled from the [facility] specialty pharmacy services due to patient losing insurance coverage and manufacturer assitance application pending.  Patient has used all available vouchers for free mediication and I was adviised by clinic to disenroll at this time.Nanetta Batty  [facility] Specialty Pharmacist

## 2018-10-28 ENCOUNTER — Ambulatory Visit: Admit: 2018-10-28 | Discharge: 2018-10-28 | Attending: Hematology & Oncology | Primary: Hematology & Oncology

## 2018-10-28 ENCOUNTER — Other Ambulatory Visit: Admit: 2018-10-28 | Discharge: 2018-10-28

## 2018-10-28 ENCOUNTER — Ambulatory Visit: Admit: 2018-10-28 | Discharge: 2018-10-28

## 2018-10-28 DIAGNOSIS — C439 Malignant melanoma of skin, unspecified: Principal | ICD-10-CM

## 2018-10-29 DIAGNOSIS — C73 Malignant neoplasm of thyroid gland: Principal | ICD-10-CM

## 2018-10-29 DIAGNOSIS — E890 Postprocedural hypothyroidism: Principal | ICD-10-CM

## 2018-10-29 MED ORDER — LEVOTHYROXINE 125 MCG TABLET: tablet | 11 refills | 0 days | Status: AC

## 2018-11-03 ENCOUNTER — Telehealth: Admit: 2018-11-03 | Discharge: 2018-11-04 | Attending: Psychiatry | Primary: Psychiatry

## 2018-11-16 IMAGING — DX DG CHEST 1V PORT
1 series · 1 of 1 positions shown · non-contrast
Comparison: Chest radiograph 09/21/2011

CLINICAL DATA: Sepsis. Diaphoresis and tachypnea. Chest pain and
shortness of breath.

EXAM:
PORTABLE CHEST 1 VIEW

[chest]
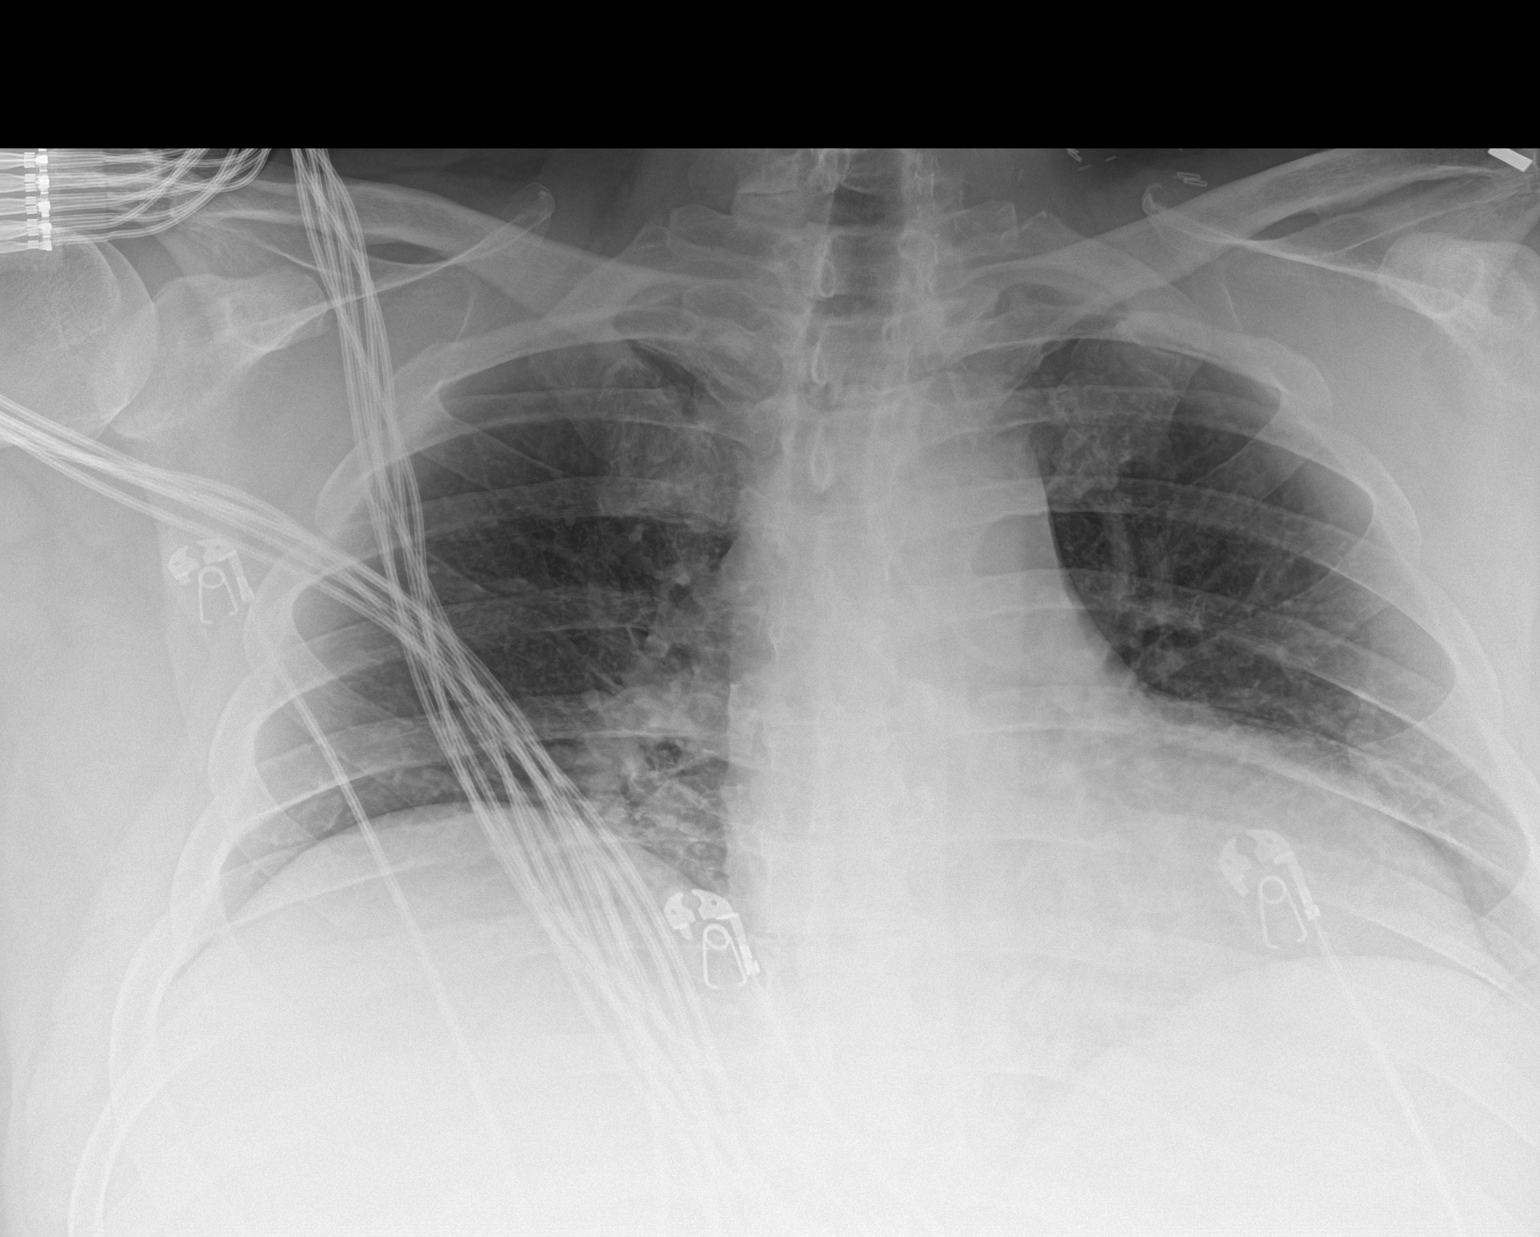

[1 of 1 positions shown; findings below may reference images not displayed]

FINDINGS: Low lung volumes limit assessment. Patchy right infrahilar opacity.
Heart appears prominent size, likely accentuated by technique and
low lung volumes. No convincing pulmonary edema. No evidence of
pleural effusion, however left costophrenic angle excluded from the
field of view. No pneumothorax. Surgical clips in supraclavicular
soft tissues.
IMPRESSION: Low lung volumes limit assessment. Patchy right infrahilar opacity
may be atelectasis or pneumonia. Recommend PA and lateral views when
patient is able.

Prominent cardiac silhouette likely accentuated by technique.

## 2018-11-16 IMAGING — CT CT ABD-PELV W/O CM
2 of 4 series · 15 of 46 positions shown, 17 images · non-contrast
Comparison: CT 05/08/2010

CLINICAL DATA: Epigastric pain. Elevated lactate. Question ischemic
colitis.

EXAM:
CT ABDOMEN AND PELVIS WITHOUT CONTRAST
TECHNIQUE: Multidetector CT imaging of the abdomen and pelvis was performed
following the standard protocol without IV contrast.

[Series 3: abd/ pelvis 5.0 i30f 2 · axial · 0.98mm/px · z∈[+871,+1381]mm · 12 of 116 slices shown, 14 images]
[im 7/116  soft-tissue]
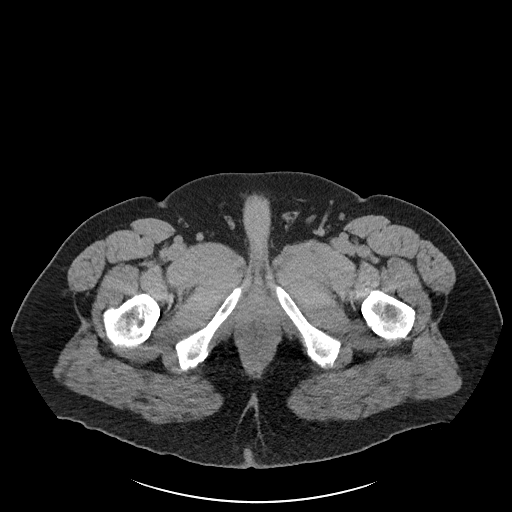
[im 7/116  bone]
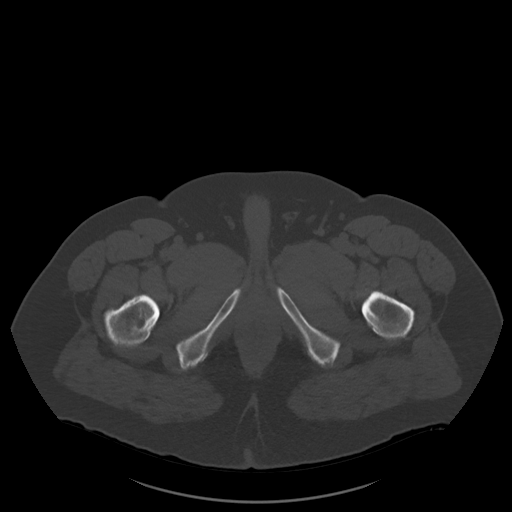
[im 21/116  soft-tissue]
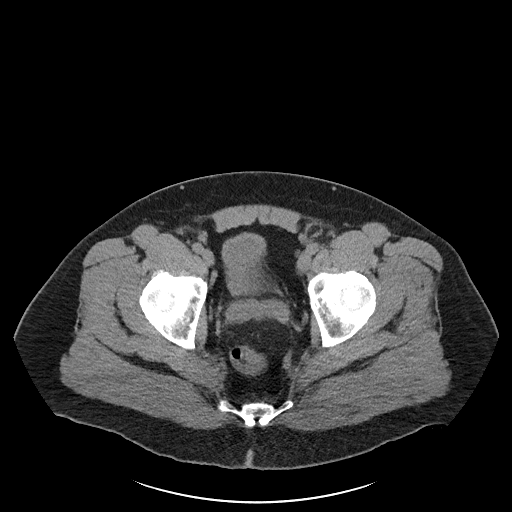
[im 28/116  soft-tissue]
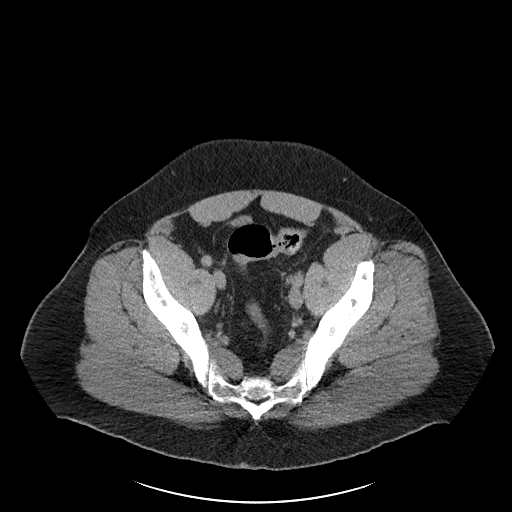
[im 34/116  soft-tissue]
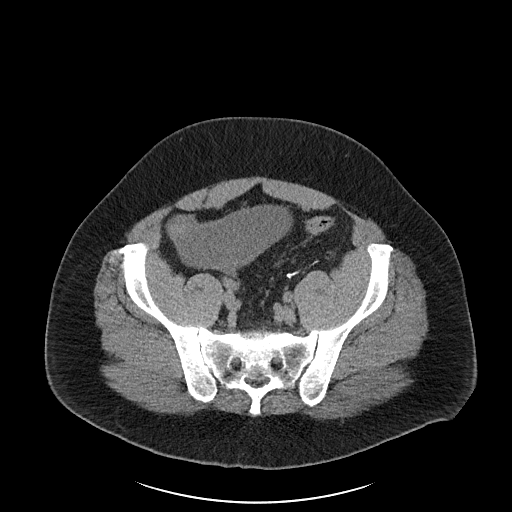
[im 48/116  soft-tissue]
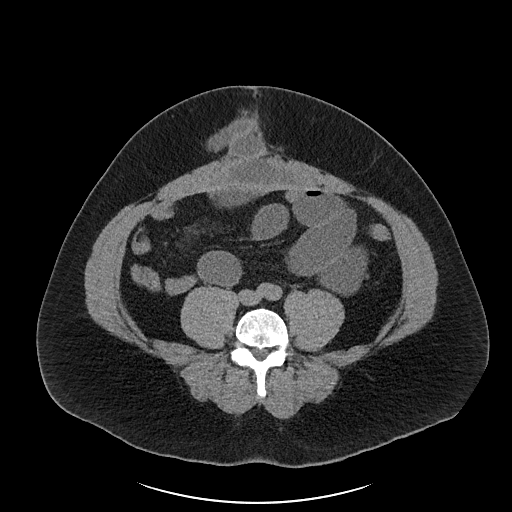
[im 55/116  soft-tissue]
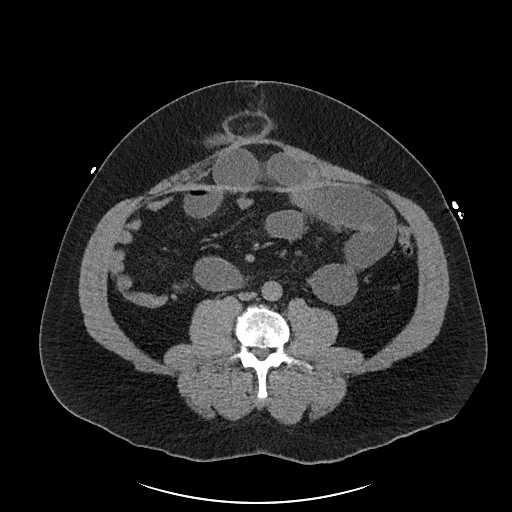
[im 61/116  soft-tissue]
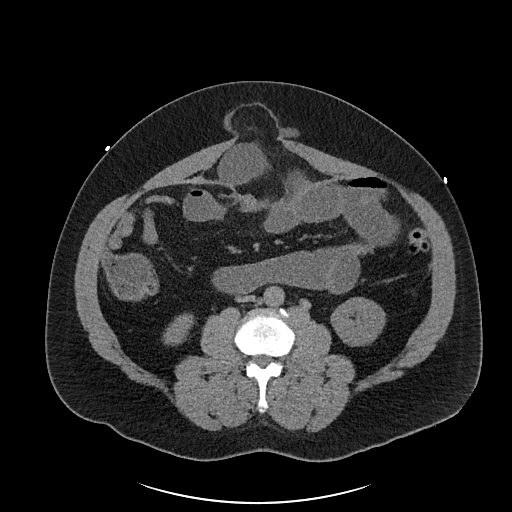
[im 75/116  soft-tissue]
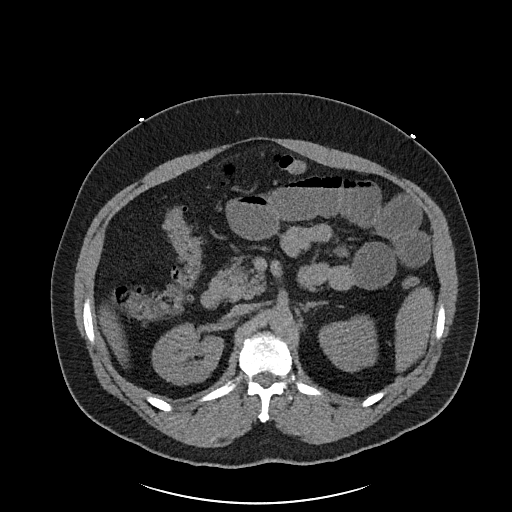
[im 82/116  soft-tissue]
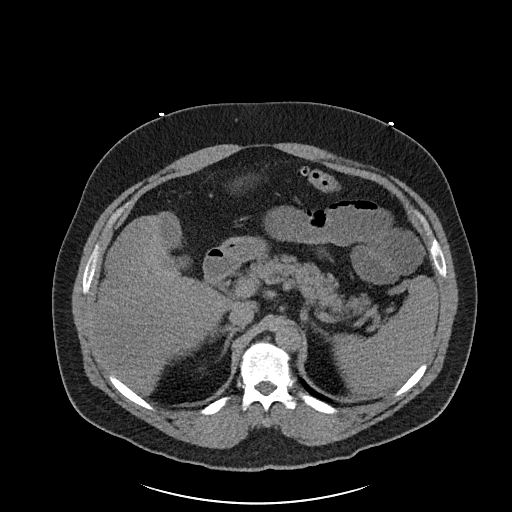
[im 82/116  bone]
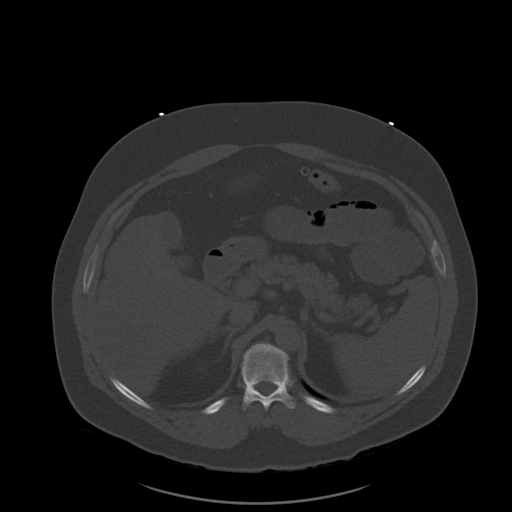
[im 88/116  soft-tissue]
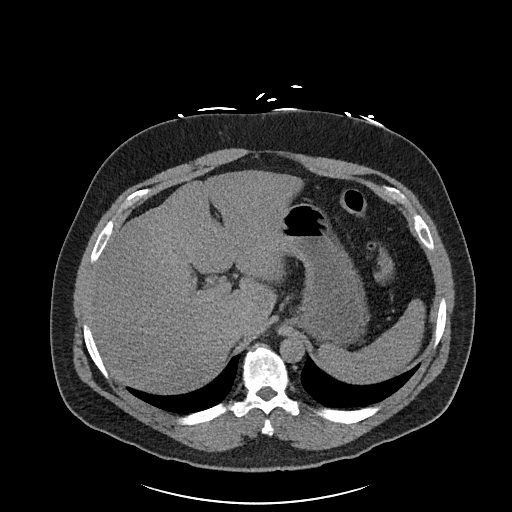
[im 102/116  soft-tissue]
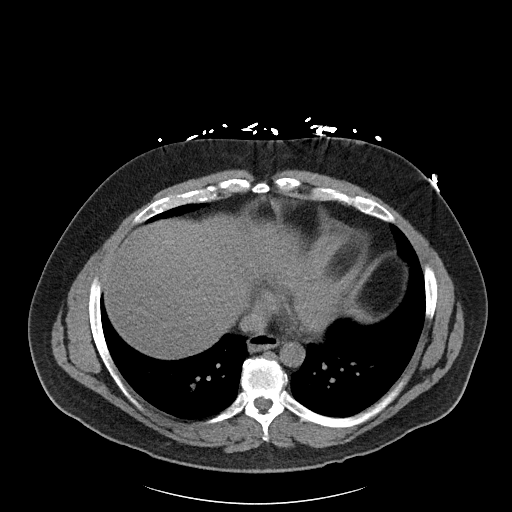
[im 109/116  soft-tissue]
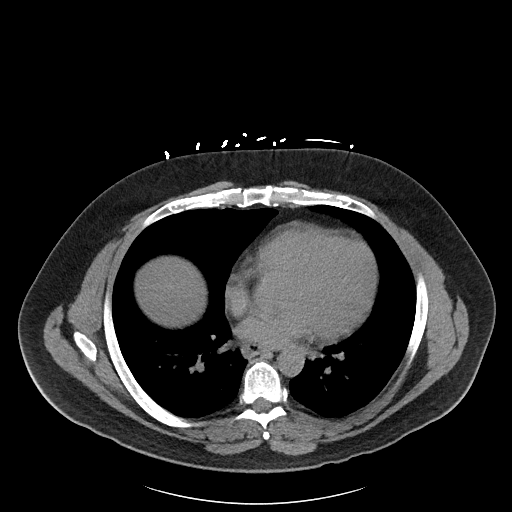

[Series 6: cor st · coronal · 0.86mm/px · 3 of 118 slices shown]
[im 40/118  soft-tissue]
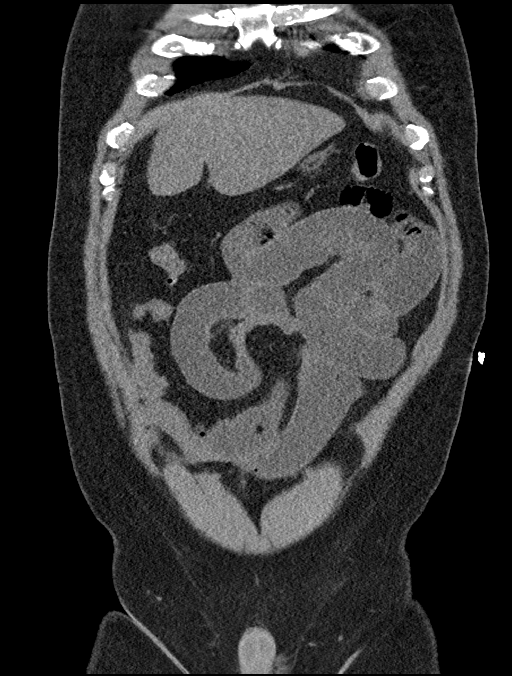
[im 53/118  soft-tissue]
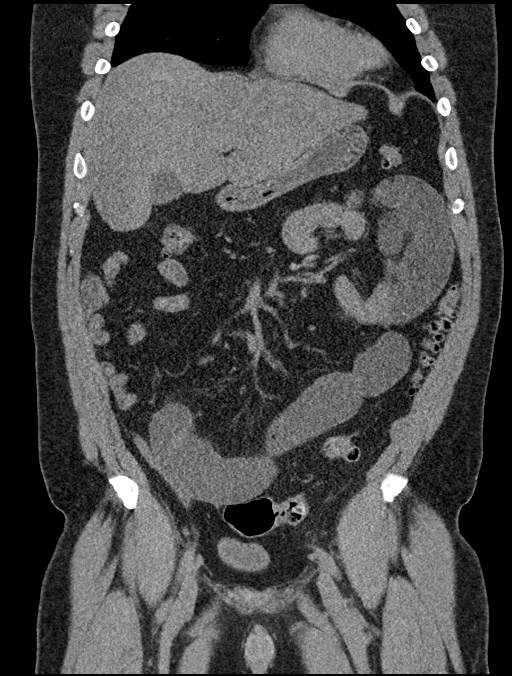
[im 66/118  soft-tissue]
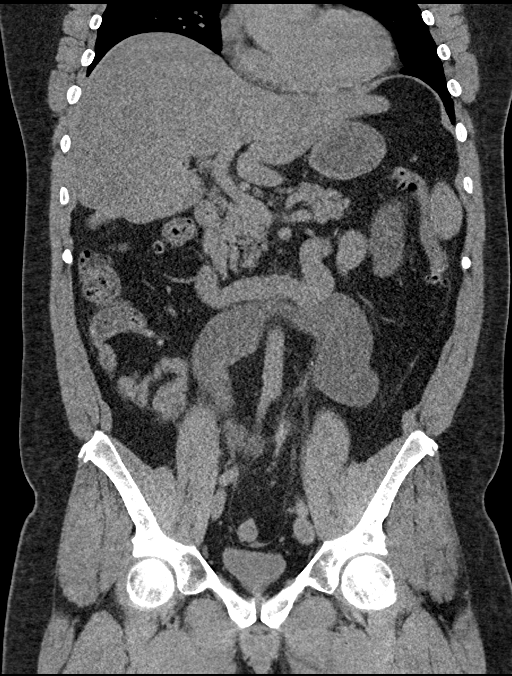

[15 of 46 positions shown; findings below may reference images not displayed]

FINDINGS: Lower chest: The lung bases are clear. Coronary artery
calcifications are seen.

Hepatobiliary: Decreased hepatic density consistent with steatosis.
No evidence of focal lesion. Gallbladder physiologically distended,
no calcified stone. No biliary dilatation.

Pancreas: No ductal dilatation or inflammation.

Spleen: Normal in size without focal abnormality.

Adrenals/Urinary Tract: No adrenal nodule. No hydronephrosis or
perinephric edema. No urolithiasis. Urinary bladder is decompressed.

Stomach/Bowel: Dilated fluid-filled proximal small bowel to
transition point within a complex ventral abdominal wall hernia,
possible prior mesh repair. Dilated fluid-filled small bowel within
and proximal to the hernia. Transition point at the hernia site with
exiting small bowel decompressed at the left aspect of the
mesh-hernia. Minimal fluid in the hernia sac. The stomach is
distended with small hiatal hernia. No pneumatosis. Multifocal
colonic diverticulosis throughout the entire colon, advanced for
age. Enteric sutures in the sigmoid. Normal appendix.

Vascular/Lymphatic: Abdominal aorta is normal in caliber. No
adenopathy.

Reproductive: Prostate is unremarkable.

Other: Small fat containing left inguinal hernia. No ascites or free
air.

Musculoskeletal: Stable sclerotic density in the left iliac bone
consistent with bone island. There are no acute or suspicious
osseous abnormalities.
IMPRESSION: 1. Small bowel obstruction with transition point within a complex
umbilical/ ventral abdominal wall hernia. No evidence of bowel
ischemia.
2. Extensive colonic diverticulosis throughout the entire colon, no
evidence of acute diverticulitis. No colonic inflammation to suggest
ischemic colitis.
3. Hepatic steatosis.

## 2018-11-24 ENCOUNTER — Ambulatory Visit
Admit: 2018-11-24 | Discharge: 2018-11-30 | Disposition: A | Discharge status: Home / Self Care | Payer: PRIVATE HEALTH INSURANCE | Admitting: Hematology & Oncology

## 2018-11-30 MED ORDER — Medication
325.00 | Status: DC
Start: ? — End: 2018-11-30

## 2018-11-30 MED ORDER — Medication
30.00 | Status: DC
Start: ? — End: 2018-11-30

## 2018-11-30 MED ORDER — GENERIC EXTERNAL MEDICATION
Status: DC
Start: 2018-12-01 — End: 2018-11-30

## 2018-11-30 MED ORDER — CVS EAR DROPS OT
40.00 | OTIC | Status: DC
Start: 2018-11-30 — End: 2018-11-30

## 2018-11-30 MED ORDER — SENNA-PSYLLIUM 18-82 % PO GRAN
250.00 | GRANULES | ORAL | Status: DC
Start: 2018-12-01 — End: 2018-11-30

## 2018-11-30 MED ORDER — ISOVUE-M 300 61 % IJ SOLN
4.00 | INTRAMUSCULAR | Status: DC
Start: ? — End: 2018-11-30

## 2018-11-30 MED ORDER — MEDIOTIC-HC 1-1-0.1 % OT SOLN
5.00 | OTIC | Status: DC
Start: 2018-12-01 — End: 2018-11-30

## 2018-11-30 MED ORDER — ONDANSETRON 4 MG DISINTEGRATING TABLET
ORAL_TABLET | Freq: Three times a day (TID) | ORAL | 0 refills | 30 days | Status: CP | PRN
Start: 2018-11-30 — End: 2018-12-30

## 2018-12-07 ENCOUNTER — Ambulatory Visit: Admit: 2018-12-07 | Discharge: 2018-12-07

## 2018-12-07 DIAGNOSIS — C439 Malignant melanoma of skin, unspecified: Principal | ICD-10-CM

## 2018-12-08 MED ORDER — CALCITRIOL 0.25 MCG CAPSULE
ORAL_CAPSULE | Freq: Every day | ORAL | 1 refills | 30 days | Status: CP
Start: 2018-12-08 — End: ?

## 2018-12-16 ENCOUNTER — Ambulatory Visit: Admit: 2018-12-16 | Discharge: 2018-12-16 | Attending: Hematology & Oncology | Primary: Hematology & Oncology

## 2018-12-16 ENCOUNTER — Other Ambulatory Visit: Admit: 2018-12-16 | Discharge: 2018-12-16

## 2018-12-16 DIAGNOSIS — D649 Anemia, unspecified: Principal | ICD-10-CM

## 2018-12-16 DIAGNOSIS — C439 Malignant melanoma of skin, unspecified: Principal | ICD-10-CM

## 2018-12-23 ENCOUNTER — Telehealth: Admit: 2018-12-23 | Discharge: 2018-12-24

## 2018-12-24 ENCOUNTER — Telehealth: Admit: 2018-12-24 | Discharge: 2018-12-25 | Attending: Psychiatry | Primary: Psychiatry

## 2018-12-24 ENCOUNTER — Ambulatory Visit: Admit: 2018-12-24 | Discharge: 2018-12-25

## 2018-12-24 DIAGNOSIS — K436 Other and unspecified ventral hernia with obstruction, without gangrene: Principal | ICD-10-CM

## 2018-12-24 DIAGNOSIS — F330 Major depressive disorder, recurrent, mild: Principal | ICD-10-CM

## 2019-01-06 ENCOUNTER — Telehealth: Admit: 2019-01-06 | Discharge: 2019-01-07 | Attending: Clinical | Primary: Clinical

## 2019-01-22 ENCOUNTER — Encounter: Admit: 2019-01-22 | Discharge: 2019-01-23 | Payer: PRIVATE HEALTH INSURANCE | Attending: Clinical | Primary: Clinical

## 2019-01-28 ENCOUNTER — Other Ambulatory Visit: Admit: 2019-01-28 | Discharge: 2019-01-28 | Payer: PRIVATE HEALTH INSURANCE

## 2019-01-28 ENCOUNTER — Encounter: Admit: 2019-01-28 | Discharge: 2019-01-28 | Payer: PRIVATE HEALTH INSURANCE

## 2019-01-28 ENCOUNTER — Encounter
Admit: 2019-01-28 | Discharge: 2019-01-28 | Payer: PRIVATE HEALTH INSURANCE | Attending: Hematology & Oncology | Primary: Hematology & Oncology

## 2019-01-28 DIAGNOSIS — C439 Malignant melanoma of skin, unspecified: Principal | ICD-10-CM

## 2019-02-02 ENCOUNTER — Encounter
Admit: 2019-02-02 | Discharge: 2019-02-03 | Payer: PRIVATE HEALTH INSURANCE | Attending: Psychiatry | Primary: Psychiatry

## 2019-02-13 ENCOUNTER — Institutional Professional Consult (permissible substitution): Admit: 2019-02-13 | Discharge: 2019-02-14

## 2019-03-11 ENCOUNTER — Telehealth: Admit: 2019-03-11 | Discharge: 2019-03-12 | Payer: PRIVATE HEALTH INSURANCE | Attending: Oncology | Primary: Oncology

## 2019-03-11 DIAGNOSIS — Z79899 Other long term (current) drug therapy: Principal | ICD-10-CM

## 2019-03-18 ENCOUNTER — Telehealth: Admit: 2019-03-18 | Discharge: 2019-03-19 | Attending: Clinical | Primary: Clinical

## 2019-04-02 ENCOUNTER — Telehealth: Admit: 2019-04-02 | Discharge: 2019-04-03 | Attending: Clinical | Primary: Clinical

## 2019-04-23 ENCOUNTER — Telehealth: Admit: 2019-04-23 | Discharge: 2019-04-24 | Attending: Clinical | Primary: Clinical

## 2019-04-23 DIAGNOSIS — F330 Major depressive disorder, recurrent, mild: Principal | ICD-10-CM

## 2019-04-23 DIAGNOSIS — F418 Other specified anxiety disorders: Principal | ICD-10-CM

## 2019-04-29 ENCOUNTER — Ambulatory Visit: Admit: 2019-04-29 | Discharge: 2019-04-29 | Payer: PRIVATE HEALTH INSURANCE

## 2019-04-29 ENCOUNTER — Encounter: Admit: 2019-04-29 | Discharge: 2019-04-29 | Payer: PRIVATE HEALTH INSURANCE

## 2019-04-29 ENCOUNTER — Encounter
Admit: 2019-04-29 | Discharge: 2019-04-29 | Payer: PRIVATE HEALTH INSURANCE | Attending: Hematology & Oncology | Primary: Hematology & Oncology

## 2019-04-29 DIAGNOSIS — C439 Malignant melanoma of skin, unspecified: Principal | ICD-10-CM

## 2019-04-29 MED ORDER — CALCITRIOL 0.25 MCG CAPSULE
ORAL_CAPSULE | Freq: Every day | ORAL | 3 refills | 30 days | Status: CP
Start: 2019-04-29 — End: ?

## 2019-05-04 DIAGNOSIS — C799 Secondary malignant neoplasm of unspecified site: Principal | ICD-10-CM

## 2019-05-06 ENCOUNTER — Telehealth: Admit: 2019-05-06 | Discharge: 2019-05-07 | Attending: Clinical | Primary: Clinical

## 2019-05-12 ENCOUNTER — Encounter: Admit: 2019-05-12 | Discharge: 2019-05-13 | Payer: PRIVATE HEALTH INSURANCE

## 2019-05-12 DIAGNOSIS — C439 Malignant melanoma of skin, unspecified: Principal | ICD-10-CM

## 2019-05-13 ENCOUNTER — Encounter
Admit: 2019-05-13 | Discharge: 2019-05-14 | Payer: PRIVATE HEALTH INSURANCE | Attending: Radiation Oncology | Primary: Radiation Oncology

## 2019-06-10 ENCOUNTER — Encounter: Admit: 2019-06-10 | Discharge: 2019-06-11 | Payer: PRIVATE HEALTH INSURANCE | Attending: Oncology | Primary: Oncology

## 2019-06-10 DIAGNOSIS — C439 Malignant melanoma of skin, unspecified: Principal | ICD-10-CM

## 2019-06-25 ENCOUNTER — Encounter: Admit: 2019-06-25 | Discharge: 2019-06-26 | Payer: PRIVATE HEALTH INSURANCE | Attending: Clinical | Primary: Clinical

## 2019-07-01 DIAGNOSIS — C439 Malignant melanoma of skin, unspecified: Principal | ICD-10-CM

## 2019-07-06 ENCOUNTER — Encounter
Admit: 2019-07-06 | Discharge: 2019-07-06 | Payer: PRIVATE HEALTH INSURANCE | Attending: Psychiatry | Primary: Psychiatry

## 2019-07-06 ENCOUNTER — Encounter: Admit: 2019-07-06 | Discharge: 2019-07-06 | Payer: PRIVATE HEALTH INSURANCE | Attending: Clinical | Primary: Clinical

## 2019-07-06 DIAGNOSIS — F330 Major depressive disorder, recurrent, mild: Principal | ICD-10-CM

## 2019-07-06 MED ORDER — DULOXETINE 30 MG CAPSULE,DELAYED RELEASE
ORAL_CAPSULE | ORAL | 0 refills | 31 days | Status: CP
Start: 2019-07-06 — End: 2019-08-06

## 2019-07-13 ENCOUNTER — Telehealth: Admit: 2019-07-13 | Discharge: 2019-07-14 | Attending: Clinical | Primary: Clinical

## 2019-07-13 DIAGNOSIS — F331 Major depressive disorder, recurrent, moderate: Principal | ICD-10-CM

## 2019-07-29 MED ORDER — LEVOTHYROXINE 125 MCG TABLET
ORAL_TABLET | 3 refills | 0 days
Start: 2019-07-29 — End: ?

## 2019-07-30 MED ORDER — LEVOTHYROXINE 125 MCG TABLET
ORAL_TABLET | 0 refills | 0 days | Status: CP
Start: 2019-07-30 — End: ?

## 2019-08-01 MED ORDER — DULOXETINE 30 MG CAPSULE,DELAYED RELEASE
ORAL_CAPSULE | 1 refills | 0 days
Start: 2019-08-01 — End: ?

## 2019-08-02 DIAGNOSIS — E278 Other specified disorders of adrenal gland: Principal | ICD-10-CM

## 2019-08-02 MED ORDER — CALCITRIOL 0.25 MCG CAPSULE
ORAL_CAPSULE | 1 refills | 0 days
Start: 2019-08-02 — End: ?

## 2019-08-03 MED ORDER — DULOXETINE 30 MG CAPSULE,DELAYED RELEASE
ORAL_CAPSULE | Freq: Two times a day (BID) | ORAL | 1 refills | 30.00000 days | Status: CP
Start: 2019-08-03 — End: 2019-08-13

## 2019-08-04 ENCOUNTER — Telehealth: Admit: 2019-08-04 | Discharge: 2019-08-05 | Payer: PRIVATE HEALTH INSURANCE | Attending: Clinical | Primary: Clinical

## 2019-08-04 DIAGNOSIS — F331 Major depressive disorder, recurrent, moderate: Principal | ICD-10-CM

## 2019-08-04 DIAGNOSIS — F418 Other specified anxiety disorders: Principal | ICD-10-CM

## 2019-08-05 ENCOUNTER — Encounter: Admit: 2019-08-05 | Discharge: 2019-08-14 | Payer: PRIVATE HEALTH INSURANCE

## 2019-08-05 ENCOUNTER — Ambulatory Visit
Admit: 2019-08-05 | Discharge: 2019-08-06 | Payer: PRIVATE HEALTH INSURANCE | Attending: Geriatric Medicine | Primary: Geriatric Medicine

## 2019-08-05 ENCOUNTER — Ambulatory Visit
Admit: 2019-08-05 | Discharge: 2019-08-14 | Payer: PRIVATE HEALTH INSURANCE | Attending: Hematology & Oncology | Primary: Hematology & Oncology

## 2019-08-05 ENCOUNTER — Encounter
Admit: 2019-08-05 | Discharge: 2019-08-14 | Payer: PRIVATE HEALTH INSURANCE | Attending: Radiation Oncology | Primary: Radiation Oncology

## 2019-08-05 DIAGNOSIS — C439 Malignant melanoma of skin, unspecified: Principal | ICD-10-CM

## 2019-08-05 DIAGNOSIS — E278 Other specified disorders of adrenal gland: Principal | ICD-10-CM

## 2019-08-05 DIAGNOSIS — K6389 Other specified diseases of intestine: Principal | ICD-10-CM

## 2019-08-05 MED ORDER — CALCITRIOL 0.25 MCG CAPSULE
ORAL_CAPSULE | Freq: Every day | ORAL | 3 refills | 30.00000 days | Status: CP
Start: 2019-08-05 — End: ?

## 2019-08-06 DIAGNOSIS — R739 Hyperglycemia, unspecified: Principal | ICD-10-CM

## 2019-08-09 ENCOUNTER — Encounter: Payer: Self-pay | Admitting: Sports Medicine

## 2019-08-09 MED ORDER — MELATONIN 3 MG TABLET
ORAL_TABLET | Freq: Every evening | ORAL | 1 refills | 90.00000 days | Status: CP
Start: 2019-08-09 — End: ?

## 2019-08-13 NOTE — Unmapped (Signed)
Hi,     Kenneth Fitzgerald [facility] contacted the Communication Center requesting to speak with the care team of Kenneth Fitzgerald [facility] to discuss:    Patient missed a call from palliative care, wanted to follow up.    Please contact Kenneth Fitzgerald at [phone].    Thank you,   [name]  [facility]   [phone]

## 2019-08-18 ENCOUNTER — Ambulatory Visit: Admit: 2019-08-18 | Discharge: 2019-08-19 | Payer: PRIVATE HEALTH INSURANCE | Attending: Clinical | Primary: Clinical

## 2019-08-18 MED ORDER — POLYETHYLENE GLYCOL 3350 17 GRAM/DOSE ORAL POWDER
ORAL | 0 refills | 0.00000 days | Status: CP
Start: 2019-08-18 — End: ?

## 2019-08-18 MED ORDER — POLYETHYLENE GLYCOL 3350 17 GRAM/DOSE ORAL POWDER: g | 0 refills | 0 days | Status: AC

## 2019-08-18 MED ORDER — SIMETHICONE 125 MG CHEWABLE TABLET
ORAL_TABLET | ORAL | 0 refills | 0 days | Status: CP
Start: 2019-08-18 — End: ?
  Filled 2019-08-18: qty 4, 1d supply, fill #0

## 2019-08-18 MED ORDER — BISACODYL 5 MG TABLET,DELAYED RELEASE
ORAL_TABLET | Freq: Once | ORAL | 0 refills | 1 days | Status: CP
Start: 2019-08-18 — End: 2019-08-19
  Filled 2019-08-18: qty 119, 1d supply, fill #0
  Filled 2019-08-18: qty 238, 1d supply, fill #0
  Filled 2019-08-18: qty 2, 1d supply, fill #0

## 2019-08-18 MED FILL — SIMETHICONE 125 MG CHEWABLE TABLET: 1 days supply | Qty: 4 | Fill #0 | Status: AC

## 2019-08-18 MED FILL — CLEARLAX 17 GRAM/DOSE ORAL POWDER: 1 days supply | Qty: 238 | Fill #0 | Status: AC

## 2019-08-18 MED FILL — BISACODYL 5 MG TABLET,DELAYED RELEASE: 1 days supply | Qty: 2 | Fill #0 | Status: AC

## 2019-08-18 MED FILL — CLEARLAX 17 GRAM/DOSE ORAL POWDER: 1 days supply | Qty: 119 | Fill #0 | Status: AC

## 2019-08-24 ENCOUNTER — Ambulatory Visit: Admit: 2019-08-24 | Discharge: 2019-08-24 | Payer: PRIVATE HEALTH INSURANCE

## 2019-08-24 ENCOUNTER — Encounter
Admit: 2019-08-24 | Discharge: 2019-08-24 | Payer: PRIVATE HEALTH INSURANCE | Attending: Anesthesiology | Primary: Anesthesiology

## 2019-08-26 ENCOUNTER — Encounter
Admit: 2019-08-26 | Discharge: 2019-08-27 | Payer: PRIVATE HEALTH INSURANCE | Attending: Psychiatry | Primary: Psychiatry

## 2019-08-27 MED ORDER — DULOXETINE 30 MG CAPSULE,DELAYED RELEASE
ORAL_CAPSULE | Freq: Two times a day (BID) | ORAL | 3 refills | 30 days | Status: CP
Start: 2019-08-27 — End: ?

## 2019-08-30 ENCOUNTER — Encounter: Admit: 2019-08-30 | Discharge: 2019-08-31 | Payer: PRIVATE HEALTH INSURANCE

## 2019-08-30 DIAGNOSIS — E890 Postprocedural hypothyroidism: Principal | ICD-10-CM

## 2019-08-30 DIAGNOSIS — C73 Malignant neoplasm of thyroid gland: Principal | ICD-10-CM

## 2019-08-30 DIAGNOSIS — R739 Hyperglycemia, unspecified: Principal | ICD-10-CM

## 2019-08-30 DIAGNOSIS — E278 Other specified disorders of adrenal gland: Principal | ICD-10-CM

## 2019-08-30 MED ORDER — METFORMIN 500 MG TABLET
ORAL_TABLET | Freq: Two times a day (BID) | ORAL | 3 refills | 90.00000 days | Status: CP
Start: 2019-08-30 — End: 2020-08-29

## 2019-08-30 MED ORDER — CALCITRIOL 0.25 MCG CAPSULE
ORAL_CAPSULE | Freq: Every day | ORAL | 11 refills | 30.00000 days | Status: CP
Start: 2019-08-30 — End: ?

## 2019-09-02 DIAGNOSIS — K529 Noninfective gastroenteritis and colitis, unspecified: Principal | ICD-10-CM

## 2019-09-09 ENCOUNTER — Encounter: Admit: 2019-09-09 | Discharge: 2019-09-10 | Payer: PRIVATE HEALTH INSURANCE | Attending: Clinical | Primary: Clinical

## 2019-09-17 ENCOUNTER — Telehealth
Admit: 2019-09-17 | Discharge: 2019-09-18 | Payer: PRIVATE HEALTH INSURANCE | Attending: Geriatric Medicine | Primary: Geriatric Medicine

## 2019-09-17 DIAGNOSIS — C439 Malignant melanoma of skin, unspecified: Principal | ICD-10-CM

## 2019-09-17 DIAGNOSIS — Z515 Encounter for palliative care: Principal | ICD-10-CM

## 2019-09-17 DIAGNOSIS — G479 Sleep disorder, unspecified: Principal | ICD-10-CM

## 2019-09-21 ENCOUNTER — Encounter: Admit: 2019-09-21 | Discharge: 2019-09-22 | Payer: PRIVATE HEALTH INSURANCE | Attending: Oncology | Primary: Oncology

## 2019-09-21 DIAGNOSIS — C73 Malignant neoplasm of thyroid gland: Principal | ICD-10-CM

## 2019-09-21 DIAGNOSIS — Z79899 Other long term (current) drug therapy: Principal | ICD-10-CM

## 2019-09-30 ENCOUNTER — Encounter
Admit: 2019-09-30 | Discharge: 2019-10-01 | Payer: PRIVATE HEALTH INSURANCE | Attending: Gastroenterology | Primary: Gastroenterology

## 2019-09-30 DIAGNOSIS — K5010 Crohn's disease of large intestine without complications: Principal | ICD-10-CM

## 2019-09-30 DIAGNOSIS — K529 Noninfective gastroenteritis and colitis, unspecified: Principal | ICD-10-CM

## 2019-09-30 MED ORDER — FOLIC ACID 1 MG TABLET
ORAL_TABLET | Freq: Every day | ORAL | 11 refills | 30.00000 days | Status: CP
Start: 2019-09-30 — End: ?

## 2019-09-30 MED ORDER — SULFASALAZINE 500 MG TABLET
ORAL_TABLET | ORAL | 11 refills | 0.00000 days | Status: CP
Start: 2019-09-30 — End: ?

## 2019-10-06 DIAGNOSIS — C439 Malignant melanoma of skin, unspecified: Principal | ICD-10-CM

## 2019-10-06 MED ORDER — ENCORAFENIB 75 MG CAPSULE
ORAL_CAPSULE | Freq: Every day | ORAL | 11 refills | 30.00000 days | Status: CP
Start: 2019-10-06 — End: ?
  Filled 2019-11-04: qty 180, 30d supply, fill #0

## 2019-10-06 MED ORDER — BINIMETINIB 15 MG TABLET
ORAL_TABLET | Freq: Two times a day (BID) | ORAL | 11 refills | 30.00000 days | Status: CP
Start: 2019-10-06 — End: ?
  Filled 2019-11-04: qty 180, 30d supply, fill #0

## 2019-10-07 DIAGNOSIS — C439 Malignant melanoma of skin, unspecified: Principal | ICD-10-CM

## 2019-10-11 MED ORDER — SULFASALAZINE 500 MG TABLET
ORAL_TABLET | 11 refills | 0 days | Status: CP
Start: 2019-10-11 — End: ?
  Filled 2019-10-25: qty 240, 30d supply, fill #0

## 2019-10-11 NOTE — Unmapped (Addendum)
[facility] Specialty Medication Onboarding    Specialty Medication: Engineer, maintenance (IT)  Prior Authorization: Approved   Financial Assistance: No - copay  <$25  Final Copay/Day Supply: $0 each / 30 days each    Insurance Restrictions: Yes - max 1 month supply     Notes to Pharmacist: Patient is/was getting from mfg assistance through 10/21/19.     The triage team has completed the benefits investigation and has determined that the patient is able to fill this medication at [facility]. Please contact the patient to complete the onboarding or follow up with the prescribing physician as needed.

## 2019-10-21 MED ORDER — LEVOTHYROXINE 125 MCG TABLET
ORAL_TABLET | 0 refills | 0.00000 days
Start: 2019-10-21 — End: ?

## 2019-10-22 MED ORDER — LEVOTHYROXINE 125 MCG TABLET
ORAL_TABLET | 0 refills | 0.00000 days | Status: CP
Start: 2019-10-22 — End: ?

## 2019-10-25 MED FILL — SULFASALAZINE 500 MG TABLET: 30 days supply | Qty: 240 | Fill #0 | Status: AC

## 2019-10-28 MED ORDER — LEVOTHYROXINE 125 MCG TABLET
ORAL_TABLET | 4 refills | 0.00000 days | Status: CP
Start: 2019-10-28 — End: ?

## 2019-11-02 NOTE — Unmapped (Signed)
[facility]   Patient Onboarding/Medication Counseling    Kenneth Fitzgerald is a 44 y.o. male with melanoma who I am counseling today on continuation of therapy.  I am speaking to the patient.    Was a Nurse, learning disability used for this call? No    Verified patient's date of birth / HIPAA.    Specialty medication(s) to be sent: Hematology/Oncology: Braftovi      Non-specialty medications/supplies to be sent: n/a      Medications not needed at this time: n/a         Braftovi (Encorafenib)    The patient declined counseling on missed dose instructions, goals of therapy, side effects and monitoring parameters, warnings and precautions, drug/food interactions and storage, handling precautions, and disposal because they have taken the medication previously. The information in the declined sections below are for informational purposes only and was not discussed with patient.       Medication & Administration     Dosage: Melanoma (unresectable or metastatic): Take six 75mg  capsules (450 mg) by mouth once daily. (in combination with binimetinib)     Administration:     ??? Administer orally at the same time each day.   ??? May be administered with or without food.  ??? If counseling for use in combination with panitumumab  o Apply moisturizing cream (e.g. Aquaphor) twice daily to the face, hands, feet, neck, back and chest.   o Apply sunscreen with SPF at least 30 prior to going out in the sun.   o Take doxycycline 100 mg PO Q12H one day before start of infusion therapy    Adherence/Missed dose instructions:   ??? Take a missed dose as soon as you think about.  If it is less than 12 hours until the next dose, skip the missed dose and go back to your normal time. Do not take 2 doses at the same time or extra doses.    Lab Requirement: Confirm BRAF V600 mutation status prior to treatment initiation: BRAF V600 mutation status confirmed and documented in chart    Goals of Therapy     ??? To prevent disease progression    Side Effects & Monitoring Parameters     Commonly reported side effects  ??? Fatigue  ??? Nausea, vomiting  ??? Diarrhea  ??? Constipation  ??? Abdominal pain  ??? Rash  ??? Itching  ??? Change in taste  ??? Painful extremities  ??? Muscle pain, joint pain, back pain  ??? Hard, thick, dry skin  ??? Dizziness   ??? Headache  ??? Hair loss  ??? Hand foot syndrome  ??? Nail and skin changes (including acneiform rash)    The following side effects should be reported to the provider:  ??? Signs of infection (fever >100.4, chills, mouth sores, sputum production)  ??? Signs of hand foot syndrome (redness or irritation of palms or soles of feet)  ??? Severe or persistent loss of energy and strength  ??? Signs of bleeding (vomiting or coughing up blood, blood that looks like coffee grounds, blood in the urine or black, red tarry stools, bruising that gets bigger without reason, any persistent or severe bleeding, impaired wound healing)  ??? Signs of cerebrovascular disease (change in strength on one side is greater than the other, trouble speaking or thinking, change in balance, vision changes)  ??? Signs of fluid and electrolyte problems (mood changes, confusion, muscle pain or weakness, abnormal/fast heartbeat, severe dizziness, passing out)  ??? Signs of high blood sugar (  confusion, fatigue, increased thirst, increase hunger, passing a lot of urine, flushing, fast breathing, breathe that smells like fruit)  ??? Vison changes (eye pain, severe eye irritation, seeing colored dots, seeing halos or bright lights around lights)  ??? Mole changes  ??? Skin changes  ??? Impaired skin wound healing  ??? Burning or numbness feeling  ??? Fast heartbeat, abnormal heartbeat or passing out  ??? Signs of anaphylaxis (wheezing, chest tightness, swelling of face, lips, tongue or throat)  ??? Very bad headache    Monitoring Parameters:   ??? BRAFV600K or V600E mutation status (prior to treatment)  ??? Electrolytes  ??? Verify pregnancy status in females of reproductive potential prior to treatment initiation  ??? Dermatologic evaluations prior to initiation, every 2 months during therapy, and for up to 6 months following discontinuation to assess for new cutaneous malignancies  ??? Monitor for signs/symptoms of uveitis, hemorrhage, and dermatologic toxicity, and for non-cutaneous malignancies.   ??? LFTs and Renal Function tests.  ??? Ophthalmological tests  ??? Monitor adherence.    Contraindications, Warnings, & Precautions     ??? Dermatologic toxicity: Grade 3 or 4 dermatologic toxicity occurred in ~20% of patients receiving encorafenib as a single agent, compared to 2% of patients receiving the combination of encorafenib and binimetinib.  ??? GI toxicity: Encorafenib is associated with a moderate emetic potential; antiemetics may be recommended to prevent nausea and vomiting.  ??? Hemorrhage: Hemorrhage, including ? grade 3 events, may occur when administered in combination with other agents.   ??? Malignancy: New primary malignancies (cutaneous and non-cutaneous) have been observed in patients treated with BRAF inhibitors and may occur with encorafenib.   ??? Ocular toxicity: Uveitis has been observed with the combination of encorafenib and binimetinib.   ??? QT prolongation: Encorafenib is associated with dose dependent QTc interval prolongation.   ??? Appropriate use: Encorafenib is not indicated for treatment of patients with wild-type BRAF melanoma or wild-type BRAF colorectal cancer. Exposing BRAF wild-type cells to BRAF inhibitors such as encorafenib may result in paradoxical activation of MAP-kinase signaling and increased cell proliferation. Prior to initiating therapy, confirm BRAF V600E or BRAF V600K mutation status with an approved test.   ??? Reproductive Considerations: Verify pregnancy status in females of reproductive potential prior to initiating encorafenib therapy. Females of reproductive potential should use a highly effective non-hormonal contraceptive during therapy and for at least 2 weeks after the last encorafenib dose; hormonal contraceptives may not be effective. Based on its mechanism of action and on findings in animal reproduction studies, encorafenib may cause fetal harm if administered during pregnancy.  ??? Breastfeeding Considerations:  It is not known if encorafenib is present in breast milk.  Due to the potential for serious adverse reactions in the breastfed infant, breastfeeding is not recommended by the manufacturer during treatment and for 2 weeks after the last encorafenib dose.    Drug/Food Interactions     ??? Medication list reviewed in Epic. The patient was instructed to inform the care team before taking any new medications or supplements. No drug interactions identified.   ??? Avoid grapefruit and grapefruit juice (may increase encorafenib concentrations).    Storage, Handling Precautions, & Disposal     ??? Store at room temperature in the original container (do not use a pillbox or store with other medications). Do not take out of the anti-moisture cube or packet.   ??? Store in a dry place.  Do not store in a bathroom.    ??? Caregivers helping administer  medication should wear gloves and wash hands immediately after.    ??? Keep the lid tightly closed. Keep out of the reach of children and pets.  ??? Do not flush down a toilet or pour down a drain unless instructed to do so.  Check with your local police department or fire station about drug take-back programs in your area.          Current Medications (including OTC/herbals), Comorbidities and Allergies     Current Outpatient Medications   Medication Sig Dispense Refill   ??? binimetinib ([name]) 15 mg tablet Take 3 tablets (45 mg total) by mouth Two (2) times a day. 180 tablet 11   ??? blood sugar diagnostic Strp Dispense 100 blood glucose test strips, ok to sub any brand preferred by insurance/patient, use 3x/day; dispense whatever brand matches with meter. 100 strip 12   ??? blood-glucose meter kit Use as instructed; dispense 1 meter, whatever is preferred by insurance 1 each 1   ??? calcitrioL (ROCALTROL) 0.25 MCG capsule Take 1 capsule (0.25 mcg total) by mouth daily. 30 capsule 11   ??? calcium carbonate 650 mg calcium (1,625 mg) tablet Take 1 tablet by mouth Three (3) times a day with a meal.     ??? DULoxetine (CYMBALTA) 30 MG capsule Take 1 capsule (30 mg total) by mouth Two (2) times a day. 60 capsule 3   ??? encorafenib (BRAFTOVI) 75 mg capsule Take 6 capsules (450 mg total) by mouth daily. 180 capsule 11   ??? fexofenadine (ALLEGRA ALLERGY) 180 MG tablet Take 180 mg by mouth daily as needed.      ??? folic acid (FOLVITE) 1 MG tablet Take 1 tablet (1 mg total) by mouth daily. 30 tablet 11   ??? ibuprofen (ADVIL,MOTRIN) 200 MG tablet Take 600 mg by mouth daily as needed for pain.     ??? lancets Misc Dispense 100 lancets, ok to sub any brand preferred by insurance/patient, use 3x/day 100 each 12   ??? levothyroxine (SYNTHROID) 125 MCG tablet Taking LT4 250 mcg x 4 days/week, and 375 mcg x 3 days/week 210 tablet 4   ??? melatonin 3 mg Tab Take 1 tablet (3 mg total) by mouth every evening. 90 tablet 1   ??? metFORMIN (GLUCOPHAGE) 500 MG tablet Take 1 tablet (500 mg total) by mouth 2 (two) times a day with meals. 180 tablet 3   ??? sulfaSALAzine (AZULFIDINE) 500 mg tablet Take 2 tablets (1000mg ) by mouth twice daily for 7 days, then take 4 tablets (2000mg ) twice daily thereafter. 240 tablet 11     No current facility-administered medications for this visit.       Allergies   Allergen Reactions   ??? Compazine [Prochlorperazine] Itching   ??? Coconut Nausea And Vomiting   ??? Multihance [Gadobenate Dimeglumine] Other (See Comments)     Patient sneezed immediately after administration of contrast.       Patient Active Problem List   Diagnosis   ??? 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 Malignant neoplasm of thyroid gland (CMS-HCC)   ??? Postoperative hypothyroidism   ??? Hypocalcemia   ??? Thyroid cancer (CMS-HCC)   ??? Melanoma (CMS-HCC)   ??? Ventral hernia with bowel obstruction   ??? Small bowel obstruction (CMS-HCC)   ??? Insomnia   ??? Crohn's disease of large intestine without complication (CMS-HCC)       Reviewed and up to date in Epic.    Appropriateness of Therapy  Is medication and dose appropriate based on diagnosis? Yes    Prescription has been clinically reviewed: Yes    Baseline Quality of Life Assessment      How many days over the past month did your condtion  keep you from your normal activities? For example, brushing your teeth or getting up in the morning. 0    Financial Information     Medication Assistance provided: Prior Authorization    Anticipated copay of $0 reviewed with patient. Verified delivery address.    Delivery Information     Scheduled delivery date: 11/05/19    Expected start date: continuation    Medication will be delivered via UPS to the prescription address in [facility].  This shipment will not require a signature.      Explained the services we provide at [facility] and that each month we would call to set up refills.  Stressed importance of returning phone calls so that we could ensure they receive their medications in time each month.  Informed patient that we should be setting up refills 7-10 days prior to when they will run out of medication.  A pharmacist will reach out to perform a clinical assessment periodically.  Informed patient that a welcome packet and a drug information handout will be sent.      Patient verbalized understanding of the above information as well as how to contact the pharmacy at [phone] option 4 with any questions/concerns.  The pharmacy is open Monday through Friday 8:30am-4:30pm.  A pharmacist is available 24/7 via pager to answer any clinical questions they may have.    Patient Specific Needs     - Does the patient have any physical, cognitive, or cultural barriers? No    - Patient prefers to have medications discussed with  Patient     - Is the patient or caregiver able to read and understand education materials at a high school level or above? Yes    - Patient's primary language is  English     - Is the patient high risk? Yes, patient is taking oral chemotherapy. Appropriateness of therapy as been assessed    - Does the patient require a Care Management Plan? No     - Does the patient require physician intervention or other additional services (i.e. nutrition, smoking cessation, social work)? No      Florene [address] [facility] Specialty Pharmacist

## 2019-11-02 NOTE — Unmapped (Signed)
[facility]   Patient Onboarding/Medication Counseling    Kenneth Fitzgerald is a 44 y.o. male with melanoma who I am counseling today on continuation of therapy.  I am speaking to the patient.    Was a Nurse, learning disability used for this call? No    Verified patient's date of birth / HIPAA.    Specialty medication(s) to be sent: Hematology/Oncology: [facility]      Non-specialty medications/supplies to be sent: n/a      Medications not needed at this time: n/a         [name] (Binimetinib)    The patient declined counseling on missed dose instructions, goals of therapy, side effects and monitoring parameters, warnings and precautions, drug/food interactions and storage, handling precautions, and disposal because they have taken the medication previously. The information in the declined sections below are for informational purposes only and was not discussed with patient.       Medication & Administration     Dosage: Melanoma: 45 mg by mouth twice daily (in combination with encorafenib)    Administration:   ??? Take about 12 hours apart  ??? Take with or without food at the same scheduled time(s) each day for each dose.      Adherence/Missed dose instructions:  ??? Do not administer a missed dose if <6 hours until the next dose.   ??? Do not take an additional dose if vomiting occurs; resume with the next scheduled dose.  ??? If taken in combination with paniumumab infusion:  ??? Apply moisturizing cream (e.g. Aquaphor) twice daily to the face, hands, feet, neck, back and chest.   ??? Apply sunscreen with SPF of at least 30 prior to sun exposure.   ??? Take Doxycycline 100mg  by mouth every 12 hours starting 1 day before starting infusion    Lab Requirement (IF USED WITH BRAFTOVI): Confirm BRAF V600 mutation status prior to treatment initiation: BRAF V600 mutation status confirmed and documented in chart    Goals of Therapy     ??? To prevent disease progression    Side Effects & Monitoring Parameters     Commonly reported side effects  ??? Nausea  ??? Vomiting  ??? Diarrhea  ??? Abdominal Pain  ??? Constipation  ??? Changes in vision  ??? Peripheral edema  ??? Rash  ??? Fatigue    The following side effects should be reported to the provider:  ??? Signs of infection (fever >100.4, chills, mouth sores, sputum production)  ??? Severe or persistent loss of energy and strength  ??? Severe headache, dizziness, passing out  ??? Signs of blood clots (numbness or weakness on one side of the body, pain, redness, tenderness, warmth, or swelling in the arms or legs, change in color of an arm of leg, chest pain, shortness of breath, fast heartbeat)  ??? Signs of bleeding (vomiting or coughing up blood, blood that looks like coffee grounds, blood in the urine or black, red tarry stools, bruising that gets bigger without reason, any persistent or severe bleeding, impaired wound healing)  ??? Signs of a severe pulmonary disorder (trouble breathing, shortness of breath, a cough that is new or worse).    ??? Signs of liver problems (dark urine, abdominal pain, light-colored stools, vomiting, yellow skin or eyes)  ??? Signs of low sodium (head, trouble focusing, trouble with memory, confusion, weakness, seizures, change in balance)  ??? Vison changes (eye pain, swelling, redness, blurred vision, blindness severe eye irritation, seeing colored dots, seeing halos or bright lights  around lights)  ??? Signs of heart problems (cough or shortness of breath that is new or worse, swelling of ankle of legs, abnormal heartbeat, weight gain of 5 of more lbs. in 24 hours, dizziness or passing out)  ??? Signs of anaphylaxis (wheezing, chest tightness, swelling of face, lips, tongue or throat)    Monitoring Parameters: BRAFV600K or V600E mutation status (prior to treatment); monitor liver function tests prior to treatment initiation, monthly during treatment, and as clinically indicated; monitor CPK and creatinine levels prior to treatment initiation, periodically during treatment, and as clinically indicated. Verify pregnancy status in females of reproductive potential prior to treatment initiation. Assess ejection fraction, prior to treatment initiation, one month after initiation, and then every 2 to 3 months during treatment; Evaluate visual symptoms at each visit; perform an ophthalmologic examination at regular intervals and for new or worsening visual disturbances; perform ophthalmologic evaluation for patient-reported acute vision loss or other visual disturbance within 24 hours. Monitor for signs/symptoms of dermatologic toxicity, hemorrhage, interstitial lung disease, ocular toxicity, rhabdomyolysis, and thromboembolism. Monitor adherence.    Contraindications, Warnings, & Precautions     ??? Females of reproductive potential should use a highly effective contraceptive during therapy and for at least 30 days after the final dose.  ??? Breastfeeding is not recommended during treatment and for 3 days after the last dose.  ??? Cardiotoxicity: Cardiomyopathy has been reported in patients who received the combination of binimetinib and encorafenib.   ??? GI toxicity: Binimetinib is associated with a moderate emetic potential.  ??? Hemorrhage: Hemorrhage may occur with the combination of binimetinib and encorafenib, including ? grade 3 hemorrhage. The most frequent hemorrhagic events were gastrointestinal in nature. Fatal intracranial hemorrhage was also reported.   ??? Hepatotoxicity: Hepatotoxicity may occur with the combination of binimetinib and encorafenib; grade 3 or 4 elevations in ALT, AST, and/or alkaline phosphatase have been reported.  ??? Ocular toxicity: Serous retinopathy has occurred with the combination of binimetinib and encorafenib. Symptomatic serous retinopathy has been reported, although blindness did not occur. Uveitis has been reported in patients treated with the combination of binimetinib and encorafenib  ??? Pulmonary toxicity: Cases of interstitial lung disease (ILD), including pneumonitis, developed in patients with BRAF mutation-positive melanoma receiving the combination of binimetinib and encorafenib.   ??? Rhabdomyolysis: Rhabdomyolysis may occur with the combination of binimetinib and encorafenib; CPK elevations may commonly occur.   ??? Thromboembolism: VTE, including PE, has occurred in patients receiving binimetinib in combination with encorafenib.   ??? Appropriate use: Prior to initiating therapy, confirm BRAF V600E or BRAF V600K mutation status with an approved test.   ??? Reproductive considerations: Verify pregnancy status in females of reproductive potential prior to initiating binimetinib therapy. Females of reproductive potential should use a highly effective contraceptive during therapy and for at least 30 days after the final binimetinib dose. Based on its mechanism of action and on findings in animal reproduction studies, binimetinib may cause fetal harm if administered during pregnancy.  ??? Breastfeeding considerations: It is not known if binimetinib is present in breast milk.  Due to the potential for serious adverse reactions in the breastfed infant, breastfeeding is not recommended by the manufacturer during treatment and for 3 days after the last binimetinib dose.    Drug/Food Interactions     ??? Medication list reviewed in Epic. The patient was instructed to inform the care team before taking any new medications or supplements. No drug interactions identified.     Storage, Handling Precautions, & Disposal     ???  Store at room temperature in the original container (do not use a pillbox or store with other medications).   ??? Store in a dry place.  Do not store in a bathroom.    ??? Caregivers helping administer medication should wear gloves and wash hands immediately after.  Store at room temperature in a dry location.  ??? Keep away from children and pets.  ??? Throw away unused or expired drugs. There may be drug take-back programs in your area.Keep the lid tightly closed. Keep out of the reach of children and pets.  ??? Do not flush down a toilet or pour down a drain unless instructed to do so.  Check with your local police department or fire station about drug take-back programs in your area.        Current Medications (including OTC/herbals), Comorbidities and Allergies     Current Outpatient Medications   Medication Sig Dispense Refill   ??? binimetinib ([name]) 15 mg tablet Take 3 tablets (45 mg total) by mouth Two (2) times a day. 180 tablet 11   ??? blood sugar diagnostic Strp Dispense 100 blood glucose test strips, ok to sub any brand preferred by insurance/patient, use 3x/day; dispense whatever brand matches with meter. 100 strip 12   ??? blood-glucose meter kit Use as instructed; dispense 1 meter, whatever is preferred by insurance 1 each 1   ??? calcitrioL (ROCALTROL) 0.25 MCG capsule Take 1 capsule (0.25 mcg total) by mouth daily. 30 capsule 11   ??? calcium carbonate 650 mg calcium (1,625 mg) tablet Take 1 tablet by mouth Three (3) times a day with a meal.     ??? DULoxetine (CYMBALTA) 30 MG capsule Take 1 capsule (30 mg total) by mouth Two (2) times a day. 60 capsule 3   ??? encorafenib (BRAFTOVI) 75 mg capsule Take 6 capsules (450 mg total) by mouth daily. 180 capsule 11   ??? fexofenadine (ALLEGRA ALLERGY) 180 MG tablet Take 180 mg by mouth daily as needed.      ??? folic acid (FOLVITE) 1 MG tablet Take 1 tablet (1 mg total) by mouth daily. 30 tablet 11   ??? ibuprofen (ADVIL,MOTRIN) 200 MG tablet Take 600 mg by mouth daily as needed for pain.     ??? lancets Misc Dispense 100 lancets, ok to sub any brand preferred by insurance/patient, use 3x/day 100 each 12   ??? levothyroxine (SYNTHROID) 125 MCG tablet Taking LT4 250 mcg x 4 days/week, and 375 mcg x 3 days/week 210 tablet 4   ??? melatonin 3 mg Tab Take 1 tablet (3 mg total) by mouth every evening. 90 tablet 1   ??? metFORMIN (GLUCOPHAGE) 500 MG tablet Take 1 tablet (500 mg total) by mouth 2 (two) times a day with meals. 180 tablet 3   ??? sulfaSALAzine (AZULFIDINE) 500 mg tablet Take 2 tablets (1000mg ) by mouth twice daily for 7 days, then take 4 tablets (2000mg ) twice daily thereafter. 240 tablet 11     No current facility-administered medications for this visit.       Allergies   Allergen Reactions   ??? Compazine [Prochlorperazine] Itching   ??? Coconut Nausea And Vomiting   ??? Multihance [Gadobenate Dimeglumine] Other (See Comments)     Patient sneezed immediately after administration of contrast.       Patient Active Problem List   Diagnosis   ??? 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 Malignant neoplasm of thyroid gland (CMS-HCC)   ??? Postoperative hypothyroidism   ???  Hypocalcemia   ??? Thyroid cancer (CMS-HCC)   ??? Melanoma (CMS-HCC)   ??? Ventral hernia with bowel obstruction   ??? Small bowel obstruction (CMS-HCC)   ??? Insomnia   ??? Crohn's disease of large intestine without complication (CMS-HCC)       Reviewed and up to date in Epic.    Appropriateness of Therapy     Is medication and dose appropriate based on diagnosis? Yes    Prescription has been clinically reviewed: Yes    Baseline Quality of Life Assessment      How many days over the past month did your condition  keep you from your normal activities? For example, brushing your teeth or getting up in the morning. 0    Financial Information     Medication Assistance provided: Prior Authorization    Anticipated copay of $0 reviewed with patient. Verified delivery address.    Delivery Information     Scheduled delivery date: 11/05/19    Expected start date: continuation    Medication will be delivered via UPS to the prescription address in [facility].  This shipment will not require a signature.      Explained the services we provide at [facility] and that each month we would call to set up refills.  Stressed importance of returning phone calls so that we could ensure they receive their medications in time each month.  Informed patient that we should be setting up refills 7-10 days prior to when they will run out of medication.  A pharmacist will reach out to perform a clinical assessment periodically.  Informed patient that a welcome packet and a drug information handout will be sent.      Patient verbalized understanding of the above information as well as how to contact the pharmacy at [phone] option 4 with any questions/concerns.  The pharmacy is open Monday through Friday 8:30am-4:30pm.  A pharmacist is available 24/7 via pager to answer any clinical questions they may have.    Patient Specific Needs     - Does the patient have any physical, cognitive, or cultural barriers? No    - Patient prefers to have medications discussed with  Patient     - Is the patient or caregiver able to read and understand education materials at a high school level or above? Yes    - Patient's primary language is  English     - Is the patient high risk? Yes, patient is taking oral chemotherapy. Appropriateness of therapy as been assessed    - Does the patient require a Care Management Plan? No     - Does the patient require physician intervention or other additional services (i.e. nutrition, smoking cessation, social work)? No      Florene Route [facility] Specialty Pharmacist

## 2019-11-04 MED FILL — MEKTOVI 15 MG TABLET: 30 days supply | Qty: 180 | Fill #0 | Status: AC

## 2019-11-04 MED FILL — BRAFTOVI 75 MG CAPSULE: 30 days supply | Qty: 180 | Fill #0 | Status: AC

## 2019-11-11 ENCOUNTER — Ambulatory Visit
Admit: 2019-11-11 | Discharge: 2019-11-12 | Payer: PRIVATE HEALTH INSURANCE | Attending: Hematology & Oncology | Primary: Hematology & Oncology

## 2019-11-11 ENCOUNTER — Ambulatory Visit: Admit: 2019-11-11 | Discharge: 2019-11-12 | Payer: PRIVATE HEALTH INSURANCE

## 2019-11-11 ENCOUNTER — Encounter: Admit: 2019-11-11 | Discharge: 2019-11-12 | Payer: PRIVATE HEALTH INSURANCE

## 2019-11-11 ENCOUNTER — Ambulatory Visit
Admit: 2019-11-11 | Discharge: 2019-11-12 | Payer: PRIVATE HEALTH INSURANCE | Attending: Geriatric Medicine | Primary: Geriatric Medicine

## 2019-11-11 DIAGNOSIS — C439 Malignant melanoma of skin, unspecified: Principal | ICD-10-CM

## 2019-11-11 LAB — COMPREHENSIVE METABOLIC PANEL
ALBUMIN: 4 g/dL (ref 3.4–5.0)
ALKALINE PHOSPHATASE: 52 U/L (ref 46–116)
ALT (SGPT): 18 U/L (ref 10–49)
ANION GAP: 9 mmol/L (ref 5–14)
AST (SGOT): 29 U/L (ref <=34)
BILIRUBIN TOTAL: 0.3 mg/dL (ref 0.3–1.2)
BLOOD UREA NITROGEN: 14 mg/dL (ref 9–23)
BUN / CREAT RATIO: 13
CALCIUM: 8.1 mg/dL — ABNORMAL LOW (ref 8.7–10.4)
CHLORIDE: 103 mmol/L (ref 98–107)
CO2: 25 mmol/L (ref 20.0–31.0)
CREATININE: 1.05 mg/dL
EGFR CKD-EPI AA MALE: >90 mL/min/{1.73_m2} (ref >=60)
EGFR CKD-EPI NON-AA MALE: 86 mL/min/{1.73_m2} (ref >=60)
GLUCOSE RANDOM: 88 mg/dL (ref 70–179)
POTASSIUM: 3.8 mmol/L (ref 3.4–4.5)
PROTEIN TOTAL: 7.4 g/dL (ref 5.7–8.2)
SODIUM: 137 mmol/L (ref 135–145)

## 2019-11-11 LAB — CBC W/ AUTO DIFF
BASOPHILS ABSOLUTE COUNT: 0.1 10*9/L (ref 0.0–0.1)
BASOPHILS RELATIVE PERCENT: 0.8 %
EOSINOPHILS ABSOLUTE COUNT: 0.3 10*9/L (ref 0.0–0.4)
EOSINOPHILS RELATIVE PERCENT: 4 %
HEMATOCRIT: 40.3 % — ABNORMAL LOW (ref 41.0–53.0)
HEMOGLOBIN: 13 g/dL — ABNORMAL LOW (ref 13.5–17.5)
LARGE UNSTAINED CELLS: 1 % (ref 0–4)
LYMPHOCYTES ABSOLUTE COUNT: 1.2 10*9/L — ABNORMAL LOW (ref 1.5–5.0)
LYMPHOCYTES RELATIVE PERCENT: 17.1 %
MEAN CORPUSCULAR HEMOGLOBIN CONC: 32.4 g/dL (ref 31.0–37.0)
MEAN CORPUSCULAR HEMOGLOBIN: 26.8 pg (ref 26.0–34.0)
MEAN CORPUSCULAR VOLUME: 82.9 fL (ref 80.0–100.0)
MONOCYTES ABSOLUTE COUNT: 0.4 10*9/L (ref 0.2–0.8)
MONOCYTES RELATIVE PERCENT: 5.6 %
NEUTROPHILS ABSOLUTE COUNT: 4.9 10*9/L (ref 2.0–7.5)
NEUTROPHILS RELATIVE PERCENT: 71.4 %
RED BLOOD CELL COUNT: 4.86 10*12/L (ref 4.50–5.90)
RED CELL DISTRIBUTION WIDTH: 15.1 % — ABNORMAL HIGH (ref 12.0–15.0)
WBC ADJUSTED: 6.8 10*9/L (ref 4.5–11.0)

## 2019-11-11 LAB — CALCIUM: CALCIUM: 8.1 mg/dL — ABNORMAL LOW (ref 8.7–10.4)

## 2019-11-11 LAB — SLIDE REVIEW

## 2019-11-11 LAB — LACTATE DEHYDROGENASE: LACTATE DEHYDROGENASE: 263 U/L — ABNORMAL HIGH (ref 120–246)

## 2019-11-11 MED ADMIN — iohexoL (OMNIPAQUE) 350 mg iodine/mL solution 75 mL: 75 mL | INTRAVENOUS | @ 15:00:00 | Stop: 2019-11-11

## 2019-11-11 NOTE — Unmapped (Unsigned)
PIV in place from earlier appointment. Labs drawn, PIV removed and dressing applied. Patient tolerated well and was discharged in no distress and left for scheduled appointment.

## 2019-11-11 NOTE — Unmapped (Signed)
PRIMARY CARE PHYSICIAN  TIMOTHY R DRAPER, DO  [address]  [place] [address]    CONSULTING PHYSICIANS  Ralene Cork, Do  [address]  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   S/p thyroidectomy 06/2011 and radioactive iodine 03/2012 and again 05/2013.    Thyroid CA recurrence 11/2012 s/p L neck dissecion 12/07/2012.  2nd thyroid CA recurrence 03/2013 AND co-existing 1st melanoma recurrence 03/2013 s/p R cervical level 2-4 node dissection and partial parotidectomy  S/p radiation to R neck, completed 06/2013  Nivolumab (02/12/18 x 2 cycles) complicated by colitis that required a 6 week steroid taper.     CURRENT THERAPY:   BRAF/MEK started early May 2020    ASSESSMENT: [name] is a pleasant 44 y.o. male who presents today for further evaluation of recurrent stage IIIb melanoma.  He completed adjuvant high dose IFN in Jan 2014. Since then, thyroid CA has recurred twice and melanoma once. Most recently s/p cervical neck dissection and adjuvant XRT. Now w/metastatic disease in abdominal/pelvic LN, on BRAFtovi/[name].    PLAN  1. Stage IV melanoma: BRAF+ by IHC. Dr Eyvonne Left reviewed path: TILS present, non-brisk; lymphocytes are 20 to 30% the number of melanoma cells.  Known sites of disease include abdominal/pelvic LN.  Brain indeterminate.   Started dabrafenib/trametinib mid-May 2020, has stable disease.   - CT scan chest reviewed and NED, Abdominal MRI and brain MRI not able to be completed today, will be done in the next availalble.    -LDH stable to improved  -Several punctate lesions in the brain noted in brain 08/05/19.  He was seen by Dr. Despina Hick and no intervention recommended at that time as he was asx and they are punctate in size.  Will cont to follow Q30mos brain MRI.  - Of note, lesion in L2 seen on MRI April 2021 was not FDG avid.    - Continue BRAFtovi/[name]; labs and echocardiogram reviewed and WNL.   - RTC in a weeks for review of brain MRI and abdominal MRI.     #.) Atypical Crohn's; recent colonoscopy w/ulcerations and crypt abscesses.  Sulfasalazine started.      #.) Recent episode of dark red emesis; concerning for blood.  No recurrence.  Will message GI medicine doctor for consideration of EGD.     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 During hospital stay, it was determined that this was malignant thrombus and no AC recommended.  Will follow.     #.) Hernia; he has had several abdominal hernia repairs, surgeon recommends weight loss as a first step.  He is trying; encouraged healthy habits.     #. Thyroid CA, recurrent. Well controlled now. Followed by endocrinology for thyroid and Ca+ replacement and ongoing monitoring of glucose.    #. Mood; excessive stress related to h/o 2 malignancies, job loss and legal issues surrounding, parents both passed away in recent years.   He follows w/ Dr. Willaim Bane.     #.) HypoCA; he struggles w/compliance during the workday,cont Ca supplementation      INTERVAL HISTORY: This is a return visit to the [facility] for further evaluation of melanoma.    - 3 weeks ago, had one episode of dark red/coffee ground emesis  - this came out of  nowhere in the morning, felt dizzy and then vomited  - stayed home from church that morning, but didn't seek medical attention and symptoms resolved.  - bowels are normal for him; recent diagnosis of atypical crohn's disease, started on sulfasalazine.   - just started about about 10 days ago 2 tabs BID  - notes worsened intermittent dizziness and HA since then.   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  This is a good change.    - has been walking the dog for exercise  - continues BRAF/MEK  - no persistent fevers, chills  - No N/V  - follows w/Dr. Willaim Bane, finds this helpful.  - Usually does 2 glasses/milk and 1500mg  Ca+  - [employer], still thinking about buying an RV      PAST MEDICAL HISTORY:   1. Diverticulitis.   2. History of ankylosing spondylitis in 2009. Please see past medical history dictated on 11/15/2010 for details. In brief, he was treated w/Enbrel and prednisone for 6mos or so.  Symptoms improved, immunosuppressants were stopped and symptoms did not recur.   3. S/p incisional hernia repair       SOCIAL HISTORY:    reports that he has never smoked. He has never used smokeless tobacco. He reports current alcohol use. He reports that he does not use drugs.      MEDICATIONS:   Current Outpatient Medications   Medication Sig Dispense Refill   ??? binimetinib ([name]) 15 mg tablet Take 3 tablets (45 mg total) by mouth Two (2) times a day. 180 tablet 11   ??? blood sugar diagnostic Strp Dispense 100 blood glucose test strips, ok to sub any brand preferred by insurance/patient, use 3x/day; dispense whatever brand matches with meter. 100 strip 12   ??? blood-glucose meter kit Use as instructed; dispense 1 meter, whatever is preferred by insurance 1 each 1   ??? calcitrioL (ROCALTROL) 0.25 MCG capsule Take 1 capsule (0.25 mcg total) by mouth daily. 30 capsule 11   ??? calcium carbonate 650 mg calcium (1,625 mg) tablet Take 1 tablet by mouth Three (3) times a day with a meal.     ??? DULoxetine (CYMBALTA) 30 MG capsule Take 1 capsule (30 mg total) by mouth Two (2) times a day. 60 capsule 3   ??? encorafenib (BRAFTOVI) 75 mg capsule Take 6 capsules (450 mg total) by mouth daily. 180 capsule 11   ??? fexofenadine (ALLEGRA ALLERGY) 180 MG tablet Take 180 mg by mouth daily as needed.      ??? folic acid (FOLVITE) 1 MG tablet Take 1 tablet (1 mg total) by mouth daily. 30 tablet 11   ??? ibuprofen (ADVIL,MOTRIN) 200 MG tablet Take 600 mg by mouth daily as needed for pain.     ??? lancets Misc Dispense 100 lancets, ok to sub any brand preferred by insurance/patient, use 3x/day 100 each 12   ??? levothyroxine (SYNTHROID) 125 MCG tablet Taking LT4 250 mcg x 4 days/week, and 375 mcg x 3 days/week 210 tablet 4   ??? melatonin 3 mg Tab Take 1 tablet (3 mg total) by mouth every evening. 90 tablet 1   ??? metFORMIN (GLUCOPHAGE) 500 MG tablet Take 1 tablet (500 mg total) by mouth 2 (two) times a day with meals. 180 tablet 3   ??? sulfaSALAzine (AZULFIDINE) 500 mg tablet Take  2 tablets (1000mg ) by mouth twice daily for 7 days, then take 4 tablets (2000mg ) twice daily thereafter. 240 tablet 11     No current facility-administered medications for this visit.       REVIEW OF SYSTEMS  A complete review of systems was obtained and was negative except for those symptoms listed in the HPI     PHYSICAL EXAM  Vitals:    11/11/19 1507   BP: 151/97   Pulse: 71   Resp: 18   Temp: 36.8 ??C (98.3 ??F)   SpO2: 99%       GEN: Awake and alert, pleasant appearing male in no acute distress  LUNGS: CTAB, no W/R/R  CV: distant heart sounds, no W/R/R  ABD; obese, soft NTND  SKIN: No rashes, petechiae or jaundice noted.  All melanoma scarlines examined; there is a very small skin breakdown on top of head.  Appears benign, but will follow closely. Cyst mid-back.   PYSCH: Alert and oriented to person, place and time  EXT: No edema noted of the lower extremity noted.              LABS  Lab Results   Component Value Date    WBC 6.8 11/11/2019    HGB 13.0 (L) 11/11/2019    HCT 40.3 (L) 11/11/2019    PLT  11/11/2019      Comment:      Too Clumped to count: Adequate.        Lab Results   Component Value Date    NA 137 11/11/2019    K 3.8 11/11/2019    CL 103 11/11/2019    CO2 25.0 11/11/2019    BUN 14 11/11/2019    CREATININE 1.05 11/11/2019    GLU 88 11/11/2019    CALCIUM 8.1 (L) 11/11/2019    MG 1.8 11/30/2018    PHOS 4.2 12/07/2018       Lab Results   Component Value Date    BILITOT 0.3 11/11/2019    BILIDIR 0.40 11/24/2018    PROT 7.4 11/11/2019 ALBUMIN 4.0 11/11/2019    ALT 18 11/11/2019    AST 29 11/11/2019    ALKPHOS 52 11/11/2019    GGT 42 08/22/2011       Lab Results   Component Value Date    INR 1.18 01/04/2015       RADIOLOGY RESULTS;  Abd MRI 08/05/19:  IMPRESSION:  ??  --Unchanged size, number, and distribution of peritoneal implants and enlarged lymph nodes throughout the abdomen and pelvis.  -- Redemonstrated enhancing focus within the mid transverse colon appears increased in size since the previous exam. Equivocal enhancing focus within the proximal descending colon. Recommend correlation with upcoming colonoscopy.   --Enhancing lesion within the L2 vertebral body, stable to slightly decreased since the prior exam, but with inability to exclude metastasis.  --Unchanged right portal venous thrombus with chronic associated changes of the right hepatic lobe.  --For intrathoracic findings, please refer to dictation for the dedicated accompanying CT thorax.    Brain MRI 08/05/19:  IMPRESSION:  Interval increase in number and conspicuity of multiple bilateral parenchymal punctate T1 hyperintense lesions, many of which enhance as described suspicious for diffuse micrometastases. Questionable leptomeningeal enhancement-single.    CT chest 11/11/19:  IMPRESSION:  No evidence of thoracic metastasis.      PET 05/12/19:  IMPRESSION:  - The sclerotic lesion with central lucency in the L2 vertebral body which displays enhancement on recent MRI does not show any evidence  of FDG uptake and is favored to be benign.  ??  -Multiple enlarged hypermetabolic abdominal and pelvic lymph nodes and peritoneal implants concerning for metastases, grossly unchanged since MRI dated 04/29/2019.  ??  -Interval increase in the size of the focal uptake with corresponding soft tissue lesion in the mid transverse colon. Recommend further evaluation with colonoscopy.    Echo 04/29/19:  Summary    1. The left ventricle is normal in size with normal wall thickness.    2. The left ventricular systolic function is normal, LVEF is visually  estimated at 55%.    3. The right ventricle is upper normal in size, with normal systolic  function.          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  B: Lymph node, pelvic, core needle biopsy  - Metastatic melanoma  - See comment  ??     FNA R femur 07/19/14:  : Bone, right femur, core biopsy with touch imprints  - No primary or metastatic malignancy identified  - Bone with focal osteocyte dropout, suggestive of necrosis  - Bone marrow with trilineage hematopoiesis and focal effacement by chronic inflammation (see comment)    B: Bone, right femur, fine needle aspiration  - Few atypical cells present on direct smears (see comment)  - Scant fragments of edematous stromal tissue in cell block  - Bone marrow with trilineage hematopoiesis    Diagnosis:  Lymph nodes, right cervical, levels 2-4, removal and partial parotidectomy  -Metastatic melanoma involving 1 out of 20 lymph nodes, with the largest  diameter measuring 10 mm (1.0 cm) and no evidence of extracapsular extension  (1/20)  -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

## 2019-11-23 DIAGNOSIS — K920 Hematemesis: Principal | ICD-10-CM

## 2019-11-23 NOTE — Unmapped (Signed)
Reported hematemesis 2-3 weeks ago. Single episode. No pain, melena, red blood in stool, heartburn.  Never has had hematemesis previously or since.    Probably Mallory Weiss tear, but need to rule out cancer/melanoma.    EGD at [facility]

## 2019-11-25 ENCOUNTER — Encounter: Admit: 2019-11-25 | Discharge: 2019-11-26 | Payer: PRIVATE HEALTH INSURANCE

## 2019-11-25 ENCOUNTER — Ambulatory Visit
Admit: 2019-11-25 | Discharge: 2019-12-14 | Payer: PRIVATE HEALTH INSURANCE | Attending: Radiation Oncology | Primary: Radiation Oncology

## 2019-11-25 ENCOUNTER — Encounter
Admit: 2019-11-25 | Discharge: 2019-11-26 | Payer: PRIVATE HEALTH INSURANCE | Attending: Hematology & Oncology | Primary: Hematology & Oncology

## 2019-11-25 ENCOUNTER — Encounter
Admit: 2019-11-25 | Discharge: 2019-11-26 | Payer: PRIVATE HEALTH INSURANCE | Attending: Geriatric Medicine | Primary: Geriatric Medicine

## 2019-11-25 DIAGNOSIS — C439 Malignant melanoma of skin, unspecified: Principal | ICD-10-CM

## 2019-11-25 LAB — COMPREHENSIVE METABOLIC PANEL
ALBUMIN: 4.2 g/dL (ref 3.4–5.0)
ALKALINE PHOSPHATASE: 58 U/L (ref 46–116)
ALT (SGPT): 20 U/L (ref 10–49)
ANION GAP: 12 mmol/L (ref 5–14)
AST (SGOT): 25 U/L (ref <=34)
BILIRUBIN TOTAL: 0.3 mg/dL (ref 0.3–1.2)
BLOOD UREA NITROGEN: 15 mg/dL (ref 9–23)
BUN / CREAT RATIO: 13
CALCIUM: 8.1 mg/dL — ABNORMAL LOW (ref 8.7–10.4)
CHLORIDE: 102 mmol/L (ref 98–107)
CO2: 24 mmol/L (ref 20.0–31.0)
CREATININE: 1.14 mg/dL
EGFR CKD-EPI AA MALE: 90 mL/min/{1.73_m2} (ref >=60)
EGFR CKD-EPI NON-AA MALE: 78 mL/min/{1.73_m2} (ref >=60)
GLUCOSE RANDOM: 153 mg/dL (ref 70–179)
POTASSIUM: 3.6 mmol/L (ref 3.4–4.5)
PROTEIN TOTAL: 7.1 g/dL (ref 5.7–8.2)
SODIUM: 138 mmol/L (ref 135–145)

## 2019-11-25 LAB — LACTATE DEHYDROGENASE: LACTATE DEHYDROGENASE: 223 U/L (ref 120–246)

## 2019-11-25 LAB — CBC W/ AUTO DIFF
BASOPHILS ABSOLUTE COUNT: 0.1 10*9/L (ref 0.0–0.1)
BASOPHILS RELATIVE PERCENT: 0.7 %
EOSINOPHILS ABSOLUTE COUNT: 0.3 10*9/L (ref 0.0–0.4)
EOSINOPHILS RELATIVE PERCENT: 4.2 %
HEMATOCRIT: 40.6 % — ABNORMAL LOW (ref 41.0–53.0)
HEMOGLOBIN: 12.8 g/dL — ABNORMAL LOW (ref 13.5–17.5)
LARGE UNSTAINED CELLS: 1 % (ref 0–4)
LYMPHOCYTES ABSOLUTE COUNT: 1.7 10*9/L (ref 1.5–5.0)
LYMPHOCYTES RELATIVE PERCENT: 23.2 %
MEAN CORPUSCULAR HEMOGLOBIN CONC: 31.6 g/dL (ref 31.0–37.0)
MEAN CORPUSCULAR HEMOGLOBIN: 25.8 pg — ABNORMAL LOW (ref 26.0–34.0)
MEAN CORPUSCULAR VOLUME: 81.6 fL (ref 80.0–100.0)
MEAN PLATELET VOLUME: 9.7 fL (ref 7.0–10.0)
MONOCYTES ABSOLUTE COUNT: 0.3 10*9/L (ref 0.2–0.8)
MONOCYTES RELATIVE PERCENT: 3.6 %
NEUTROPHILS ABSOLUTE COUNT: 4.8 10*9/L (ref 2.0–7.5)
NEUTROPHILS RELATIVE PERCENT: 66.9 %
PLATELET COUNT: 322 10*9/L (ref 150–440)
RED BLOOD CELL COUNT: 4.97 10*12/L (ref 4.50–5.90)
RED CELL DISTRIBUTION WIDTH: 15.6 % — ABNORMAL HIGH (ref 12.0–15.0)
WBC ADJUSTED: 7.2 10*9/L (ref 4.5–11.0)

## 2019-11-25 MED ADMIN — gadobenate dimeglumine (MULTIHANCE) 529 mg/mL (0.1mmol/0.2mL) solution 20 mL: 20 mL | INTRAVENOUS | @ 17:00:00 | Stop: 2019-11-25

## 2019-11-25 NOTE — Unmapped (Signed)
PRIMARY CARE PHYSICIAN  Kenneth R DRAPER, DO  [address]  [place] [address]    CONSULTING PHYSICIANS  Kenneth Fitzgerald, Kenneth Fitzgerald  [address]  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   S/p thyroidectomy 06/2011 and radioactive iodine 03/2012 and again 05/2013.    Thyroid CA recurrence 11/2012 s/p L neck dissecion 12/07/2012.  2nd thyroid CA recurrence 03/2013 AND co-existing 1st melanoma recurrence 03/2013 s/p R cervical level 2-4 node dissection and partial parotidectomy  S/p radiation to R neck, completed 06/2013  Nivolumab (02/12/18 x 2 cycles) complicated by colitis that required a 6 week steroid taper.     CURRENT THERAPY:   BRAF/MEK started early May 2020    ASSESSMENT: [name] is a pleasant 44 y.o. male who presents today for further evaluation of recurrent stage IIIb melanoma.  He completed adjuvant high dose IFN in Jan 2014. Since then, thyroid CA has recurred twice and melanoma once. Most recently s/p cervical neck dissection and adjuvant XRT. Now w/metastatic disease in abdominal/pelvic LN, on BRAFtovi/[name].    PLAN  1. Stage IV melanoma: BRAF+ by IHC. Dr Kenneth Fitzgerald reviewed path: TILS present, non-brisk; lymphocytes are 20 to 30% the number of melanoma cells.  Known sites of disease include abdominal/pelvic LN.  Brain indeterminate.   Started dabrafenib/trametinib mid-May 2020, has had stable disease.   - CT scan chest reviewed and NED, abdominal MRI stable, unfortunately brain MRI w/innumerable punctate lesions.  No LM enhancement.  Unclear if this represents brain mets vs. Another process.    -Will pursue additional workup including: MRI spine to eval for extent of metastatic lesions in CNS, Lumbar puncture to eval for LM disease.   - Will d/w Dr. Janus Fitzgerald, [facility] neuro-oncology given atypical nature of lesions.  I have also d/w Dr. Bartholomew Fitzgerald rad/onc, who agrees w/the plan  - RTC pending spine MRI, LP, discussion w/neuro-onc.   -Fortunately, he is asx at this time.  Will cont to monitor closely for symptoms.  If this is progression of disease, tx include WBRT vs. Clinical trial at [facility] w/CNS penetrant BRAF inhibitor.  - In meantime, Continue BRAFtovi/[name]; labs and last echocardiogram reviewed and WNL.     #.) Atypical Crohn's; recent colonoscopy w/ulcerations and crypt abscesses.  Sulfasalazine started.      #.) Recent episode of dark red emesis; concerning for blood.  No recurrence.  Messaged GI medicine doctor for consideration of EGD.     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 During hospital stay, it was determined that this was malignant thrombus and no AC recommended.  Will follow.     #.) Hernia; he has had several abdominal hernia repairs, surgeon recommends weight loss as a first step.  He is trying; encouraged healthy habits.     #. Thyroid CA, recurrent. Well controlled now. Followed by endocrinology for thyroid and Ca+ replacement and ongoing monitoring of glucose.    #. Mood; excessive stress related to h/o 2 malignancies, job loss and legal issues surrounding, parents both passed away in recent years.   He follows w/ Dr. Willaim Fitzgerald.     #.) HypoCA; he struggles w/compliance during the workday,cont Ca supplementation      INTERVAL HISTORY: This is a return visit to the [facility] for further evaluation of melanoma.  - no new CNS symptoms  - No more hematemesis  - bowels are normal for him; recent diagnosis of atypical crohn's disease, started on sulfasalazine 2 tabs BID  - notes worsened intermittent dizziness and HA since then.   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  This is a good change.    - has been walking the dog for exercise  - continues BRAF/MEK  - no persistent fevers, chills  - No N/V  - follows w/Dr. Willaim Fitzgerald, finds this helpful.  - Usually does 2 glasses/milk and 1500mg  Ca+  - [employer], still thinking about buying an RV  -no other changes      PAST MEDICAL HISTORY:   1. Diverticulitis.   2. History of ankylosing spondylitis in 2009. Please see past medical history dictated on 11/15/2010 for details. In brief, he was treated w/Enbrel and prednisone for 6mos or so.  Symptoms improved, immunosuppressants were stopped and symptoms did not recur.   3. S/p incisional hernia repair       SOCIAL HISTORY:    reports that he has never smoked. He has never used smokeless tobacco. He reports current alcohol use. He reports that he does not use drugs.      MEDICATIONS:   Current Outpatient Medications   Medication Sig Dispense Refill   ??? binimetinib ([name]) 15 mg tablet Take 3 tablets (45 mg total) by mouth Two (2) times a day. 180 tablet 11   ??? blood sugar diagnostic Strp Dispense 100 blood glucose test strips, ok to sub any brand preferred by insurance/patient, use 3x/day; dispense whatever brand matches with meter. 100 strip 12   ??? blood-glucose meter kit Use as instructed; dispense 1 meter, whatever is preferred by insurance 1 each 1   ??? calcitrioL (ROCALTROL) 0.25 MCG capsule Take 1 capsule (0.25 mcg total) by mouth daily. 30 capsule 11   ??? calcium carbonate 650 mg calcium (1,625 mg) tablet Take 1 tablet by mouth Three (3) times a day with a meal.     ??? DULoxetine (CYMBALTA) 30 MG capsule Take 1 capsule (30 mg total) by mouth Two (2) times a day. 60 capsule 3   ??? encorafenib (BRAFTOVI) 75 mg capsule Take 6 capsules (450 mg total) by mouth daily. 180 capsule 11   ??? fexofenadine (ALLEGRA ALLERGY) 180 MG tablet Take 180 mg by mouth daily as needed.      ??? folic acid (FOLVITE) 1 MG tablet Take 1 tablet (1 mg total) by mouth daily. 30 tablet 11   ??? ibuprofen (ADVIL,MOTRIN) 200 MG tablet Take 600 mg by mouth daily as needed for pain.     ??? lancets Misc Dispense 100 lancets, ok to sub any brand preferred by insurance/patient, use 3x/day 100 each 12   ??? levothyroxine (SYNTHROID) 125 MCG tablet Taking LT4 250 mcg x 4 days/week, and 375 mcg x 3 days/week 210 tablet 4   ??? melatonin 3 mg Tab Take 1 tablet (3 mg total) by mouth every evening. 90 tablet 1   ??? metFORMIN (GLUCOPHAGE) 500 MG tablet Take 1 tablet (500 mg total) by mouth 2 (two) times a day with meals. 180 tablet 3   ??? sulfaSALAzine (AZULFIDINE) 500 mg tablet Take 2 tablets (1000mg ) by mouth twice daily for 7 days, then take 4  tablets (2000mg ) twice daily thereafter. 240 tablet 11     No current facility-administered medications for this visit.       REVIEW OF SYSTEMS  A complete review of systems was obtained and was negative except for those symptoms listed in the HPI     PHYSICAL EXAM  Vitals:    11/25/19 1458   BP: 168/93   Pulse: 75   Resp: 16   Temp: 36.9 ??C (98.5 ??F)   SpO2: 99%       GEN: Awake and alert, pleasant appearing male in no acute distress  LUNGS: CTAB, no W/R/R  CV: distant heart sounds, no W/R/R  ABD; obese, soft NTND  SKIN: No rashes, petechiae or jaundice noted.  All melanoma scarlines examined; there is a very small skin breakdown on top of head.  Appears benign, but will follow closely. Cyst mid-back.   PYSCH: Alert and oriented to person, place and time  EXT: No edema noted of the lower extremity noted.              LABS  Lab Results   Component Value Date    WBC 7.2 11/25/2019    HGB 12.8 (L) 11/25/2019    HCT 40.6 (L) 11/25/2019    PLT 322 11/25/2019       Lab Results   Component Value Date    NA 138 11/25/2019    K 3.6 11/25/2019    CL 102 11/25/2019    CO2 24.0 11/25/2019    BUN 15 11/25/2019    CREATININE 1.14 11/25/2019    GLU 153 11/25/2019    CALCIUM 8.1 (L) 11/25/2019    MG 1.8 11/30/2018    PHOS 4.2 12/07/2018       Lab Results   Component Value Date    BILITOT 0.3 11/25/2019    BILIDIR 0.40 11/24/2018    PROT 7.1 11/25/2019 ALBUMIN 4.2 11/25/2019    ALT 20 11/25/2019    AST 25 11/25/2019    ALKPHOS 58 11/25/2019    GGT 42 08/22/2011       Lab Results   Component Value Date    INR 1.18 01/04/2015       RADIOLOGY RESULTS;  Abd MRI 11/25/19:  ??  ??  IMPRESSION:  ??  --Examination is limited due to extensive motion artifact.  ??  --Unchanged size and number of metastatic peritoneal implants. No new lesions are seen.  ??  -- Unchanged size of a 1.9 cm indeterminate lesion in the L2 vertebral body, now hypoenhancing or nonenhancing. Ongoing follow-up is suggested.  ??  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  ??  --Please see same-day CT chest for full report of findings above the diaphragm    Brain MRI 11/25/19:  IMPRESSION:  --Interval increase in number and conspicuity of multiple bilateral T1 hyperintense lesions are seen throughout the cerebrum and the cerebellum. Some of these lesions may demonstrate subtle enhancement. Findings are concerning for diffuse micrometastases.  ??  --Rim-enhancing lesion within the superior right parietal bone, concerning for osseous metastasis.  ??  -- No discrete leptomeningeal enhancement is identified      CT chest 11/11/19:  IMPRESSION:  No evidence of thoracic metastasis.      PET 05/12/19:  IMPRESSION:  - The sclerotic lesion with central lucency in the L2 vertebral body which displays enhancement on recent MRI does not show any evidence of FDG uptake and is favored to be benign.  ??  -Multiple enlarged hypermetabolic abdominal  and pelvic lymph nodes and peritoneal implants concerning for metastases, grossly unchanged since MRI dated 04/29/2019.  ??  -Interval increase in the size of the focal uptake with corresponding soft tissue lesion in the mid transverse colon. Recommend further evaluation with colonoscopy.    Echo 04/29/19:  Summary    1. The Fitzgerald ventricle is normal in size with normal wall thickness.    2. The Fitzgerald ventricular systolic function is normal, LVEF is visually  estimated at 55%.    3. The right ventricle is upper normal in size, with normal systolic  function.          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  B: Lymph node, pelvic, core needle biopsy  - Metastatic melanoma  - See comment  ??     FNA R femur 07/19/14:  : Bone, right femur, core biopsy with touch imprints  - No primary or metastatic malignancy identified  - Bone with focal osteocyte dropout, suggestive of necrosis  - Bone marrow with trilineage hematopoiesis and focal effacement by chronic inflammation (see comment)    B: Bone, right femur, fine needle aspiration  - Few atypical cells present on direct smears (see comment)  - Scant fragments of edematous stromal tissue in cell block  - Bone marrow with trilineage hematopoiesis    Diagnosis:  Lymph nodes, right cervical, levels 2-4, removal and partial parotidectomy  -Metastatic melanoma involving 1 out of 20 lymph nodes, with the largest  diameter measuring 10 mm (1.0 cm) and no evidence of extracapsular extension  (1/20)  -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

## 2019-11-25 NOTE — Unmapped (Signed)
See me back in a few weeks once we have a plan.  In meantime, we'll get:  -spine MRI  -lumbar puncture  -discuss your case w/Dr. Reather Converse      Stay on your BRAF/MEK drugs in the meantime.     For appointments & questions Monday through Friday 8 AM???5 PM     Please call [phone] or Toll free [phone]    For urgent clinical needs on Nights, Weekends or Holidays  Call [phone] and ask for the oncologist on call.     For appointment changes please contact during normal business hours.     Please visit [email or web address], a resource created just for family members and caregivers.  This website lists support services, how and where to ask for help. It has tools to assist you as you help [place] care for your loved one.    [facility]  [address]  [place], [place] [address]  [email or web address]

## 2019-11-25 NOTE — Unmapped (Signed)
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    ASSESSMENT:  Disease Status: Progression of lesions in the brain    RECOMMENDATIONS:  1. FOLLOW-UP:  I discussed the patient's case with Dr. Nedra Hai.  This is not the typical radiographic appearance of melanoma brain mets.  He is asymptomatic.  We will obtain MRI of cervical, thoracic, lumbar spine and lumbar puncture for cytology etc.  If after these test the consensus is that these are brain mets, Whole brain radiotherapy would be an option.  2. TOBACCO:  Does not use Tobacco or Current Tobacco user.      INTERVAL HISTORY:  Patient doing well.  No symptoms related to intracranial lesions.    REVIEW OF SYSTEMS:  A comprehensive review of 10 systems was negative except for pertinent positives noted in HPI.    PAST MEDICAL HISTORY/FAMILY HISTORY/SOCIAL HISTORY:  Reviewed in EPIC    ALLERGIES/MEDICATIONS:  Reviewed in EPIC    PHYSICAL EXAM:  Vital Signs for this encounter:   There were no vitals taken for this visit.  Pain:  Pain Score (0 - 10):    Pain Location:     Karnofsky/Lansky Performance Status: 100 - fully active, normal (ECOG equivalent 0)  Water Bottle:  YES  General/Constitutional:   No acute distress, alert and oriented X 4   Neurological:   Normal Gait and Cognition.  CN II-XII focally intact    RADIOLOGY:  I personally reviewed the brian MRI performed today.  Numerous new punctate lesions throughout the brain, best seen on T1 non-contrast sequences.  Official report pending    MRI abdomen 11/25/19  IMPRESSION:  ??  --Examination is limited due to extensive motion artifact.  ??  --Unchanged size and number of metastatic peritoneal implants. No new lesions are seen.  ??  -- Unchanged size of a 1.9 cm indeterminate lesion in the L2 vertebral body, now hypoenhancing or nonenhancing. Ongoing follow-up is suggested.  ??  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  ??  --Please see same-day CT chest for full report of findings above the diaphragm    Labs:  None    I personally spent 20 minutes face-to-face and non-face-to-face in the care of this patient, which includes all pre, intra, and post visit time on the date of service.    Briane Birden S. Despina Hick, MD  Associate Professor  Associate Chair for Clinical Operations and Improvement  Director of Patient Safety and Quality  Department of Radiation Oncology  [facility]  [address], [address]  [place], [place] [address]  O: [phone]  11/25/19 3:49 PM

## 2019-11-26 ENCOUNTER — Ambulatory Visit
Admit: 2019-11-26 | Discharge: 2019-11-27 | Payer: PRIVATE HEALTH INSURANCE | Attending: Geriatric Medicine | Primary: Geriatric Medicine

## 2019-11-26 NOTE — Unmapped (Signed)
[facility] Clinical Assessment & Refill Coordination Note    [facility], DOB: 06/01/1975  Phone: [phone] (home)     All above HIPAA information was verified with patient.     Was a Nurse, learning disability used for this call? No    Specialty Medication(s):   Hematology/Oncology: [facility]     Current Outpatient Medications   Medication Sig Dispense Refill   ??? binimetinib ([name]) 15 mg tablet Take 3 tablets (45 mg total) by mouth Two (2) times a day. 180 tablet 11   ??? blood sugar diagnostic Strp Dispense 100 blood glucose test strips, ok to sub any brand preferred by insurance/patient, use 3x/day; dispense whatever brand matches with meter. 100 strip 12   ??? blood-glucose meter kit Use as instructed; dispense 1 meter, whatever is preferred by insurance 1 each 1   ??? calcitrioL (ROCALTROL) 0.25 MCG capsule Take 1 capsule (0.25 mcg total) by mouth daily. 30 capsule 11   ??? calcium carbonate 650 mg calcium (1,625 mg) tablet Take 1 tablet by mouth Three (3) times a day with a meal.     ??? DULoxetine (CYMBALTA) 30 MG capsule Take 1 capsule (30 mg total) by mouth Two (2) times a day. 60 capsule 3   ??? encorafenib (BRAFTOVI) 75 mg capsule Take 6 capsules (450 mg total) by mouth daily. 180 capsule 11   ??? fexofenadine (ALLEGRA ALLERGY) 180 MG tablet Take 180 mg by mouth daily as needed.      ??? folic acid (FOLVITE) 1 MG tablet Take 1 tablet (1 mg total) by mouth daily. 30 tablet 11   ??? ibuprofen (ADVIL,MOTRIN) 200 MG tablet Take 600 mg by mouth daily as needed for pain.     ??? lancets Misc Dispense 100 lancets, ok to sub any brand preferred by insurance/patient, use 3x/day 100 each 12   ??? levothyroxine (SYNTHROID) 125 MCG tablet Taking LT4 250 mcg x 4 days/week, and 375 mcg x 3 days/week 210 tablet 4   ??? melatonin 3 mg Tab Take 1 tablet (3 mg total) by mouth every evening. 90 tablet 1   ??? metFORMIN (GLUCOPHAGE) 500 MG tablet Take 1 tablet (500 mg total) by mouth 2 (two) times a day with meals. 180 tablet 3   ??? sulfaSALAzine (AZULFIDINE) 500 mg tablet Take 2 tablets (1000mg ) by mouth twice daily for 7 days, then take 4 tablets (2000mg ) twice daily thereafter. 240 tablet 11     No current facility-administered medications for this visit.        Changes to medications: Ashtan reports no changes at this time.    Allergies   Allergen Reactions   ??? Compazine [Prochlorperazine] Itching   ??? Coconut Nausea And Vomiting   ??? Multihance [Gadobenate Dimeglumine] Other (See Comments)     Patient sneezed immediately after administration of contrast.       Changes to allergies: No    SPECIALTY MEDICATION ADHERENCE     mektovi 15 mg: 10 days of medicine on hand     Medication Adherence    Patient reported X missed doses in the last month: 0  Specialty Medication: [name] 15mg           Specialty medication(s) dose(s) confirmed: Regimen is correct and unchanged.     Are there any concerns with adherence? No    Adherence counseling provided? Not needed    CLINICAL MANAGEMENT AND INTERVENTION      Clinical Benefit Assessment:    Do you feel the medicine  is effective or helping your condition? Yes    Clinical Benefit counseling provided? Not needed    Adverse Effects Assessment:    Are you experiencing any side effects? No    Are you experiencing difficulty administering your medicine? No    Quality of Life Assessment:    How many days over the past month did your condition/medication  keep you from your normal activities? For example, brushing your teeth or getting up in the morning. 0    Have you discussed this with your provider? Not needed    Therapy Appropriateness:    Is therapy appropriate? Yes, therapy is appropriate and should be continued    DISEASE/MEDICATION-SPECIFIC INFORMATION      N/A    PATIENT SPECIFIC NEEDS     - Does the patient have any physical, cognitive, or cultural barriers? No    - Is the patient high risk? Yes, patient is taking oral chemotherapy. Appropriateness of therapy as been assessed    - Does the patient require a Care Management Plan? No     - Does the patient require physician intervention or other additional services (i.e. nutrition, smoking cessation, social work)? No      SHIPPING     Specialty Medication(s) to be Shipped:   Hematology/Oncology: [name]    Other medication(s) to be shipped: No additional medications requested for fill at this time     Changes to insurance: No    Delivery Scheduled: Yes, Expected medication delivery date: 12/03/19.     Medication will be delivered via UPS to the confirmed prescription address in [facility].    The patient will receive a drug information handout for each medication shipped and additional FDA Medication Guides as required.  Verified that patient has previously received a Conservation officer, historic buildings.    All of the patient's questions and concerns have been addressed.    Rollen Sox   [facility] Shared [facility] Specialty Pharmacist

## 2019-11-26 NOTE — Unmapped (Signed)
[facility] Shared [facility] Clinical Assessment & Refill Coordination Note    Kenneth [facility], DOB: 08/03/1975  Phone: [phone] (home)     All above HIPAA information was verified with patient.     Was a Nurse, learning disability used for this call? No    Specialty Medication(s):   Hematology/Oncology: Braftovi     Current Outpatient Medications   Medication Sig Dispense Refill   ??? binimetinib ([name]) 15 mg tablet Take 3 tablets (45 mg total) by mouth Two (2) times a day. 180 tablet 11   ??? blood sugar diagnostic Strp Dispense 100 blood glucose test strips, ok to sub any brand preferred by insurance/patient, use 3x/day; dispense whatever brand matches with meter. 100 strip 12   ??? blood-glucose meter kit Use as instructed; dispense 1 meter, whatever is preferred by insurance 1 each 1   ??? calcitrioL (ROCALTROL) 0.25 MCG capsule Take 1 capsule (0.25 mcg total) by mouth daily. 30 capsule 11   ??? calcium carbonate 650 mg calcium (1,625 mg) tablet Take 1 tablet by mouth Three (3) times a day with a meal.     ??? DULoxetine (CYMBALTA) 30 MG capsule Take 1 capsule (30 mg total) by mouth Two (2) times a day. 60 capsule 3   ??? encorafenib (BRAFTOVI) 75 mg capsule Take 6 capsules (450 mg total) by mouth daily. 180 capsule 11   ??? fexofenadine (ALLEGRA ALLERGY) 180 MG tablet Take 180 mg by mouth daily as needed.      ??? folic acid (FOLVITE) 1 MG tablet Take 1 tablet (1 mg total) by mouth daily. 30 tablet 11   ??? ibuprofen (ADVIL,MOTRIN) 200 MG tablet Take 600 mg by mouth daily as needed for pain.     ??? lancets Misc Dispense 100 lancets, ok to sub any brand preferred by insurance/patient, use 3x/day 100 each 12   ??? levothyroxine (SYNTHROID) 125 MCG tablet Taking LT4 250 mcg x 4 days/week, and 375 mcg x 3 days/week 210 tablet 4   ??? melatonin 3 mg Tab Take 1 tablet (3 mg total) by mouth every evening. 90 tablet 1   ??? metFORMIN (GLUCOPHAGE) 500 MG tablet Take 1 tablet (500 mg total) by mouth 2 (two) times a day with meals. 180 tablet 3   ??? sulfaSALAzine (AZULFIDINE) 500 mg tablet Take 2 tablets (1000mg ) by mouth twice daily for 7 days, then take 4 tablets (2000mg ) twice daily thereafter. 240 tablet 11     No current facility-administered medications for this visit.        Changes to medications: Kenneth Fitzgerald reports no changes at this time.    Allergies   Allergen Reactions   ??? Compazine [Prochlorperazine] Itching   ??? Coconut Nausea And Vomiting   ??? Multihance [Gadobenate Dimeglumine] Other (See Comments)     Patient sneezed immediately after administration of contrast.       Changes to allergies: No    SPECIALTY MEDICATION ADHERENCE     Braftovi 75 mg: 10 days of medicine on hand     Medication Adherence    Patient reported X missed doses in the last month: 0  Specialty Medication: braftovi 75mg           Specialty medication(s) dose(s) confirmed: Regimen is correct and unchanged.     Are there any concerns with adherence? No    Adherence counseling provided? Not needed    CLINICAL MANAGEMENT AND INTERVENTION      Clinical Benefit Assessment:    Do you feel the medicine  is effective or helping your condition? Yes    Clinical Benefit counseling provided? Not needed    Adverse Effects Assessment:    Are you experiencing any side effects? No    Are you experiencing difficulty administering your medicine? No    Quality of Life Assessment:    How many days over the past month did your condition/medication  keep you from your normal activities? For example, brushing your teeth or getting up in the morning. 0    Have you discussed this with your provider? Not needed    Therapy Appropriateness:    Is therapy appropriate? Yes, therapy is appropriate and should be continued    DISEASE/MEDICATION-SPECIFIC INFORMATION      N/A    PATIENT SPECIFIC NEEDS     - Does the patient have any physical, cognitive, or cultural barriers? No    - Is the patient high risk? Yes, patient is taking oral chemotherapy. Appropriateness of therapy as been assessed    - Does the patient require a Care Management Plan? No     - Does the patient require physician intervention or other additional services (i.e. nutrition, smoking cessation, social work)? No      SHIPPING     Specialty Medication(s) to be Shipped:   Hematology/Oncology: Braftovi    Other medication(s) to be shipped: sulfasalazine     Changes to insurance: No    Delivery Scheduled: Yes, Expected medication delivery date: 12/03/19.     Medication will be delivered via UPS to the confirmed prescription address in [facility].    The patient will receive a drug information handout for each medication shipped and additional FDA Medication Guides as required.  Verified that patient has previously received a Conservation officer, historic buildings.    All of the patient's questions and concerns have been addressed.    Rollen Sox   [facility] Shared [facility] Specialty Pharmacist

## 2019-11-29 DIAGNOSIS — C799 Secondary malignant neoplasm of unspecified site: Principal | ICD-10-CM

## 2019-12-01 DIAGNOSIS — C439 Malignant melanoma of skin, unspecified: Principal | ICD-10-CM

## 2019-12-02 MED FILL — SULFASALAZINE 500 MG TABLET: 30 days supply | Qty: 240 | Fill #1 | Status: AC

## 2019-12-02 MED FILL — MEKTOVI 15 MG TABLET: 30 days supply | Qty: 180 | Fill #1 | Status: AC

## 2019-12-02 MED FILL — SULFASALAZINE 500 MG TABLET: 30 days supply | Qty: 240 | Fill #1

## 2019-12-02 MED FILL — BRAFTOVI 75 MG CAPSULE: 30 days supply | Qty: 180 | Fill #1 | Status: AC

## 2019-12-02 MED FILL — BRAFTOVI 75 MG CAPSULE: ORAL | 30 days supply | Qty: 180 | Fill #1

## 2019-12-02 MED FILL — MEKTOVI 15 MG TABLET: ORAL | 30 days supply | Qty: 180 | Fill #1

## 2019-12-03 NOTE — Unmapped (Signed)
Discussed in the TB on 12/03/2019. The brain lesions are concerning for metastatic melanoma.    Recommendations:  1. WBRT and continue Dabrafenib and Trametenib  vs clinical trial at [facility]. Would favor the clinical trial at [facility] due to patients with metastatic melanoma not doing well with WBRT.    Will discuss with her primary oncologist Dr Nedra Hai.      Janus Molder MD

## 2019-12-07 DIAGNOSIS — C439 Malignant melanoma of skin, unspecified: Principal | ICD-10-CM

## 2019-12-08 ENCOUNTER — Encounter: Admit: 2019-12-08 | Discharge: 2019-12-09 | Payer: PRIVATE HEALTH INSURANCE

## 2019-12-08 DIAGNOSIS — C799 Secondary malignant neoplasm of unspecified site: Principal | ICD-10-CM

## 2019-12-08 DIAGNOSIS — C439 Malignant melanoma of skin, unspecified: Principal | ICD-10-CM

## 2019-12-14 ENCOUNTER — Telehealth: Admit: 2019-12-14 | Discharge: 2019-12-15 | Payer: PRIVATE HEALTH INSURANCE | Attending: Clinical | Primary: Clinical

## 2019-12-14 ENCOUNTER — Encounter: Admit: 2019-12-14 | Discharge: 2019-12-15 | Payer: PRIVATE HEALTH INSURANCE

## 2019-12-14 DIAGNOSIS — C439 Malignant melanoma of skin, unspecified: Principal | ICD-10-CM

## 2019-12-14 DIAGNOSIS — M4807 Spinal stenosis, lumbosacral region: Principal | ICD-10-CM

## 2019-12-14 DIAGNOSIS — M47816 Spondylosis without myelopathy or radiculopathy, lumbar region: Principal | ICD-10-CM

## 2019-12-14 MED ADMIN — gadobenate dimeglumine (MULTIHANCE) 529 mg/mL (0.1mmol/0.2mL) solution 15 mL: 15 mL | INTRAVENOUS | Stop: 2019-12-14

## 2019-12-14 NOTE — Unmapped (Unsigned)
BRIEF NOTE  12/14/2019    I met with Kenneth Fitzgerald today for the first time in several months.  Care was re-established. Pt endorsed some distressed related to medical stressors; no emergent concerns.  We agreed to meet for therapy moving forward and next session was scheduled for two weeks.    Kenneth Fitzgerald. Norissa Bartee PhD concerns??or??active??SI. ??I offered psychotherapy, support,??and recommendations. ??I??will??see Kenneth Fitzgerald??next in two weeks, and he is scheduled to see Dr. Willaim Bane again on 8/12.  ??  Risk Assessment:  A suicide and violence risk assessment was performed as part of this evaluation. There patient is deemed to be at chronic elevated risk??for self-harm/suicide given the following factors: current diagnosis of depression, suicidal ideation or threats without a plan and past diagnosis of depression. There patient is deemed to be at chronic elevated risk??for violence given the following factors: male gender. These risk factors are mitigated by the following factors: lack of active SI/HI. There is no acute risk for suicide or violence at this time. The patient was educated about relevant modifiable risk factors including following recommendations for treatment of psychiatric illness and abstaining from substance abuse.??While future psychiatric events cannot be accurately predicted, the patient does not currently require acute inpatient psychiatric care and does not currently meet [facility] involuntary commitment criteria.??????????????????  ??  Plan:  1.??I will continue to see pt for psychotherapy. ??Next session??is??scheduled??to take place??on 8/19  2. Pt??has??information on available [facility] resources.  3.??Pt has been followed by Dr. Lawernce Keas of Psychiatry.  4. Pt has??my contact information and knows??to reach me as needed.  ??  Subjective:??  Pt presented as alert and oriented.????He was engaged and cooperative throughout the session??as usual.????Pt did not present as anxious or with depressed mood today.  ??  Kenneth Fitzgerald described continuing to experience less intense and frequent depressive and anxiety symptoms.??He??denied explicit feelings of hopelessness or having??thoughts of hurting himself.????Pt attributes sustained improvements to the easing of legal stressors, specifically that the pending trial remains on hold, along with taking Cymbalta. ??He is making changes related to his work, and feels good about his decisions.   ??  Kenneth Fitzgerald??did report the results of his recent scans were not the best, although noted that there are emergent concerns or immediate changes to his treatment plan.  Pt is certainly mindful of prognostic concerns, but denies perseverating at this time. His shortened life expectancy does impact his life choices and decisions, which we discussed in some depth today. He is contemplating making changes ahead.    ??  Pt??continues to take Cymbalta as prescribed by??Dr. Lawernce Keas.????He is scheduled to see her next week.  ??  I provided??Kenneth Fitzgerald with??support and understanding, and we discussed and processed??his??thoughts and feelings??again in depth.????He??continues to??find??it difficult to discuss??sensitive??issues with many people??among his social support network, and values the therapeutic for him to process his thoughts and feelings.????We spent some time??reviewing??and problem-solving cognitive and behavioral??approaches to alleviating poor mood, tolerating distress??and??aspects of stressors??that??he??cannot??control,??and??enhancing adaptive coping??during this challenging time.????As always, Kenneth Fitzgerald??was engaged and receptive.????My sense??remains??that given the stressors on hand, he has actually coping relatively well overall. ??I will continue to see him for therapy.  ??  Mental Status Exam:  Appearance:  ?? ??Appears stated age   Motor: ?? ??No abnormal movements   Speech/Language:  ?? ??Normal rate, volume, tone, fluency   Mood: ?? ??not bad, pretty good, I guess   Affect: ?? ??engaged; pleasant   Thought process: ?? ??Logical, linear, clear, coherent, goal directed   Thought content: ?? ?? ??  Denies SI/HI??or thoughts of??self-harm   Perceptual disturbances: ?? ?? ??Behavior not concerning for response to internal stimuli  ??   Orientation: ?? ??Oriented to person, place, time, and general circumstances   Attention: ?? ??Able to fully attend without fluctuations in consciousness   Concentration: ?? ??Able to fully concentrate and attend   Memory: ?? ??Immediate, short-term, long-term, and recall grossly intact   ??Fund of knowledge:  ?? ??Consistent with level of education and development   Insight: ?? ?? ??Intact   Judgment:  ?? ??Intact   Impulse Control: ?? ??Intact   ??  Diagnosis: Recurrent major depressive disorder  ??  Kenneth Fitzgerald. Kenneth Newlun PhD  November 30,??2021

## 2019-12-15 ENCOUNTER — Non-Acute Institutional Stay
Admit: 2019-12-15 | Discharge: 2020-01-14 | Payer: PRIVATE HEALTH INSURANCE | Attending: Radiation Oncology | Primary: Radiation Oncology

## 2019-12-15 ENCOUNTER — Ambulatory Visit
Admit: 2019-12-15 | Discharge: 2020-01-14 | Payer: PRIVATE HEALTH INSURANCE | Attending: Radiation Oncology | Primary: Radiation Oncology

## 2019-12-15 ENCOUNTER — Encounter
Admit: 2019-12-15 | Discharge: 2020-01-14 | Payer: PRIVATE HEALTH INSURANCE | Attending: Radiation Oncology | Primary: Radiation Oncology

## 2019-12-15 ENCOUNTER — Encounter: Admit: 2019-12-15 | Discharge: 2020-01-14 | Payer: PRIVATE HEALTH INSURANCE

## 2019-12-15 DIAGNOSIS — Z510 Encounter for antineoplastic radiation therapy: Principal | ICD-10-CM

## 2019-12-15 DIAGNOSIS — C7931 Secondary malignant neoplasm of brain: Principal | ICD-10-CM

## 2019-12-15 DIAGNOSIS — C73 Malignant neoplasm of thyroid gland: Principal | ICD-10-CM

## 2019-12-18 NOTE — Unmapped (Signed)
Called patient to discuss treatment plan.  CNS tumor board feels MRI brain findings c/w melanoma brain mets.  No disease evident in spinal cord.  LP cytology not sent for some reason, but no increased nucleated cells and protein/glucose normal.  There are no trial spots for CNS penetrant BRAF inhibitor at [facility] or [facility] right now.  Recommend WBRT.  Messaged Dr. Despina Hick and informed patient.  Otherwise, patient doing fair.  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  Also plans to follow up soon w/GI doc for inflammatory bowel disease.  Cont current BRAF/MEK for now and will hold during WBRT.

## 2019-12-22 MED ORDER — MEMANTINE 5 MG TABLET
ORAL_TABLET | ORAL | 0 refills | 0.00000 days | Status: SS
Start: 2019-12-22 — End: 2020-01-20

## 2019-12-22 NOTE — Unmapped (Signed)
Radiation Oncology Treatment Planning Note    Patient Name: [facility]  Patient Age: 44 y.o.  Date of Encounter: 12/23/2019    Treatment Intent: palliative.    CLINICAL TREATMENT PLANNING:    Rosalie Gelpi [facility] has brain metastases. Refer to the consult for full clinical details.    I plan to treat with photons utilizing 3DCRT technique.     The radiation target area/treatment site will be the whole brain.     I will attempt to minimize the dose to the normal tissues of the head and neck, including the uninvolved brain, brainstem, optic structures, and pharyngeal mucosa.    The total radiation dose will be 3000 cGy at 300 cGy/fraction for a total of 10 fractions, treated once a day.    Technique Rationale:    3DCRT: 3DCRT is necessary to increase dose conformity and to review DVH's for the organs mentioned above     Simulation Order: I requested a radiation treatment planning CT scan to acquire information regarding patient's anatomy of the treatment site, radiation treatment target volumes, and adjacent normal organs. This is required to be done in patient's treatment position for radiation treatment planning and for patient's subsequent radiation treatment positioning and treatment setups.    Verification Simulation: I have ordered for the patient to come in for verification simulation on the treatment machine to ensure that information was transferred appropriately from the planning system to the treatment machine treatment and to verify patient setup, immobilization, and image guidance.    Image Guidance/Tracking: CBCT    SIMULATION:    Type: Initial simulation of the brain.    Contrast: none    CT SCAN FOR TREATMENT PLANNING: The CT Scan is performed with 3 mm slice thickness with the body site and range to be scanned to be at least 1cm above the superior portion of the cranium through the lower neck, not to exceed 500 slices.    The patient was taken to the CT simulation room and placed in a supine position with customized immobilization and/or position devices including a thermoplastic mask. The head and neck were positioned neutral. Shoulders and arms were placed by the side. A CT scan was obtained through the pertinent area including the brain. I approved the patient set up and reviewed the CT images; both are adequate. I tentatively plan to utilize multiple fields. The number and use of treatment devices will be determined at the time of computerized planning.    I have placed an isocenter/localization point in three dimensions on these images. This was marked on the patient???s skin for subsequent radiation treatment set-up. Additional details of the CT simulation are available in the departmental [facility] electronic medical record. CT images were then transferred to the radiation treatment-planning computer for planning and dosimetry.    This simulation was attended by Dr. Despina Hick    Electronically signed by:  Blythe Stanford, MD  Radiation Oncology Resident, PGY-3  [facility]  Pager: [phone]    ATTENDING ATTESTATION:  I saw and evaluated/examined the patient, participating in the key portions of the service.  I discussed the findings, assessment, and plan of care with the resident.  I agree with the findings and plan as documented in the resident's note.    Bhishamjit S. Despina Hick, MD  Associate Professor  Associate Chair for Clinical Operations and Improvement  Director of Patient Safety and Quality  Department of Radiation Oncology  [facility]  [phone]  [address], [address]  [place], [place] [address]  O: [phone]  12/27/19 9:17 AM

## 2019-12-23 DIAGNOSIS — C73 Malignant neoplasm of thyroid gland: Principal | ICD-10-CM

## 2019-12-23 DIAGNOSIS — C7931 Secondary malignant neoplasm of brain: Principal | ICD-10-CM

## 2019-12-23 DIAGNOSIS — C719 Malignant neoplasm of brain, unspecified: Principal | ICD-10-CM

## 2019-12-23 DIAGNOSIS — Z510 Encounter for antineoplastic radiation therapy: Principal | ICD-10-CM

## 2019-12-23 NOTE — Unmapped (Signed)
Keep all scheduled appointments 

## 2019-12-23 NOTE — Unmapped (Signed)
I called [name] and explained our desire to send a specimen for [facility] molecular testing. In looking at clinical trials, we realized that he did not have formal molecular analysis outside of an IHC. Also,we can look for other possible targetable mutations. I described the process as well as the chance we may discover a germ line mutation. He has concnted and I have contacted Kenneth Fitzgerald to proceed.

## 2019-12-23 NOTE — Unmapped (Signed)
[facility] Refill Coordination Note    Specialty Medication(s) to be Shipped:   Hematology/Oncology: [name] and [name]    Other medication(s) to be shipped: Sulfasalazine 500mg      Johnte Portnoy, DOB: 10-29-1975  Phone: [phone] (home)       All above HIPAA information was verified with patient.     Was a Nurse, learning disability used for this call? No    Completed refill call assessment today to schedule patient's medication shipment from the [facility] [phone]).       Specialty medication(s) and dose(s) confirmed: Regimen is correct and unchanged.   Changes to medications: Nakul reports no changes at this time.  Changes to insurance: No  Questions for the pharmacist: No    Confirmed patient received Welcome Packet with first shipment. The patient will receive a drug information handout for each medication shipped and additional FDA Medication Guides as required.       DISEASE/MEDICATION-SPECIFIC INFORMATION        N/A    SPECIALTY MEDICATION ADHERENCE     Medication Adherence    Patient reported X missed doses in the last month: 0  Specialty Medication: Braftovi 75mg   Patient is on additional specialty medications: Yes  Additional Specialty Medications: [name] 15mg   Patient Reported Additional Medication X Missed Doses in the Last Month: 0  Patient is on more than two specialty medications: No  Informant: patient                Braftovi 75 mg: 11 days of medicine on hand   [name] 15 mg: 11 days of medicine on hand       SHIPPING     Shipping address confirmed in Epic.     Delivery Scheduled: Yes, Expected medication delivery date: 12/30/19.     Medication will be delivered via UPS to the prescription address in Epic [place].    [name] M [name]   [facility] Specialty Technician

## 2019-12-23 NOTE — Unmapped (Unsigned)
12/23/2019      Subjective/Assessment/Recommendations: Here for consent and SIM.    1. Prior radiation: Yes, has had prior radiation treatment; will retrieve records  2. Pacemaker/defibrillator: No  3. Pregnancy: Male  4. Patient education: Patient education materials and attending physician packet provided and has packet and contact information.   5. Meaningful use: Reviewed  6. Resources: CCSP  7. Other:  Has not picked up rx for namenda-was unaware.

## 2019-12-24 NOTE — Unmapped (Signed)
Called patient re: Acupuncturist (12/10, 14:15), no answer. Voicemail was full; will try again later.    Reviewed [employer] testing and financial assistance application with patient (12/16, 14:50). Application completed online: [Your application was approved for full coverage.??Your out-of-pocket costs will be $0.]. Patient informed of [employer] decision.

## 2019-12-26 NOTE — Unmapped (Deleted)
REASON FOR VISIT:  Colitis     HISTORY OF PRESENT ILLNESS:  Since last visit,  09/30/2019, CRP 14 mg/L  11/23/2019, reported hematemesis.  Recommended EGD.  11/25/2019, 11/25/2019, MRI abdomen.  Extensive motion artifact.  Stable metastatic peritoneal implants, indeterminate L2 lesion, right portal vein thrombosis.  No signs of bowel inflammation.  11/25/2019, CBC, CMP normal except hemoglobin 12.8  12/14/2019, MRI of spine.  Multilevel degenerative changes.  Moderate to severe bilateral neural foraminal stenosis at L5-S1.    I reviewed and summarized previous medical records from the referring physician and Epic Care Everywhere and recorded the summary in Test Data section below.  The history of the present illness was obtained from the patient.  44 year old male who I am seeing in consultation at request of Dr. Renato Gails for colitis.  His course is outlined below.  Currently, patient has had slightly increased stool frequency and decreased consistency over the last 2 months.  He has anywhere from 1-8 bowel movements per day but an average of 2 or 3/day.  Stool consistency ranges from watery to solid.  Nocturnal bowel movements once per month.  25% of the stools have red blood on the toilet paper but not mixed in with the stool.  All of the stool habits are slightly worse compared with prior when he was having only 1-2 bowel movements per day most of which were formed.  There are no clear exacerbating or relieving factors to his bowel frequency.  They are not related to eating.  No nausea, vomiting.  He does have mild intermittent left mid abdominal pain that again has no exacerbating or leading factors.  Intermittent anal pain which she attributes to hemorrhoids.  Describes it as a sharp pain that is occasionally worse with defecation.  Currently not having anal pain.  He had a colonoscopy by Dr. Renato Gails on 08/24/2019 that showed signs of chronic and active inflammation.  I personally reviewed color photos of the colonoscopy report.  There are several areas of what appear to be pseudopolyps but no active inflammation.  Other areas have clear ulceration.  1 particularly large area is in the cecum.  Biopsies did show chronic active colitis.  Based on these findings, Dr. Renato Gails referred him to my clinic.    MEDICATIONS:  has a current medication list which includes the following prescription(s): binimetinib, blood sugar diagnostic, blood-glucose meter, calcitriol, calcium carbonate, duloxetine, encorafenib, allegra allergy, folic acid, ibuprofen, lancets, levothyroxine, melatonin, memantine, metformin, and sulfasalazine.    ALLERGIES:  Allergies as of 12/27/2019 - Reviewed 12/23/2019   Allergen Reaction Noted   ??? Compazine [prochlorperazine] Itching 09/21/2011   ??? Coconut Nausea And Vomiting 05/14/2012   ??? Multihance [gadobenate dimeglumine] Other (See Comments) 01/28/2019       PAST MEDICAL HISTORY:  1.  Crohn's colitis.  01/09/2018, colonoscopy done for melanoma staging incidentally showed chronic active colitis in transverse colon.  No diagnosis or treatment was made.  01/2018, immunotherapy for melanoma.  03/2018, bloody diarrhea thought to be due to immunotherapy.  Stopped immunotherapy starting prednisone with improvement in symptoms.  07/2019, slight worsening of bowel frequency.  PET/CT showed increased uptake in transverse colon.  08/24/2019, colonoscopy showed signs of Crohn's colitis.  09/30/2019, start sulfasalazine.  2.  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  Treated with immunotherapy in 01/2018 which caused diarrhea prompting cessation.  Currently on targeted oral therapy.  3.  Thyroid cancer diagnosed incidentally on PET/CT done for melanoma in 2013.  Status post thyroidectomy  with 2 recurrences.  4.  Recurrent diverticulitis in 2012.  Sigmoid colectomy and June 2012 with pathology showing acute and chronic inflammatory changes as well as perforated diverticulitis.  5.  Ankylosing spondylitis diagnosed 2010.  Treated with prednisone and Humira for 1 year.  Also took sulfasalazine.  Stopped Humira due to melanoma diagnosis.  6.  Ventral hernias.  7.  Depression  8.  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    SOCIAL HISTORY:  Lives alone with his dog.  Drives trucks.  Has brothers and sisters who live in [place].    FAMILY HISTORY:  No IBD or colon cancer    ROS: Pertinent positives and negatives are documented as per the HPI; also complains of chronic low back pain, urinary frequency. all other systems reviewed are negative.    PHYSICAL EXAM:  There were no vitals taken for this visit.  Wt Readings from Last 4 Encounters:   12/23/19 (!) 147.1 kg (324 lb 4.8 oz)   11/25/19 (!) 144.9 kg (319 lb 8 oz)   11/11/19 (!) 144.4 kg (318 lb 4.8 oz)   09/30/19 (!) 141.4 kg (311 lb 12.8 oz)     CONSTITUTIONAL: awake, alert, NAD  EYES: no scleral icterus or conjunctival injection  EARS, NOSE, MOUTH, THROAT: no oral lesions  RESPIRATORY: clear to auscultation bilaterally, normal respiratory motions  CARDIOVASCULAR: regular rate, rhythm  GASTROINTESTINAL: Abdomen is soft, nontender, nondistended, no mass  MUSCULOSKELETAL: normal muscle tone, no knee or finger joint effusions  NEUROLOGIC: oriented x 3  SKIN: warm, dry, without rash    TEST DATA:  1.  01/09/2018, colonoscopy to IC valve.  Colocolonic anastomosis in the rectum.  Transverse colon diverticulosis, granularity.  Biopsies showed chronic active colitis with no CMV  2.  05/12/2019, PET/CT.  Increased uptake in soft tissue density in transverse colon.  Multiple tiny peritoneal implants.  Status post sigmoid colectomy.  Multiple ventral hernias.  Stable enlarged hypermetabolic abdominal and pelvic lymph nodes and peritoneal implants.  3.  08/05/2019, CBC, CMP normal  4.  08/24/2019, colonoscopy to terminal ileum.  Internal hemorrhoids.  Colocolonic anastomosis in the rectum.  Pancolonic diverticulosis left> right.  Patchy erythema, granularity, edema, ulcers, pseudopolyps from 0-58 cm.  Tattoo placed 5 cm distal to area of pseudopolyps.  Dark mucosa in mid transverse colon.  Normal ileum.  Transverse colon biopsy normal.  Transverse colon mass biopsy showed moderate chronic active colitis, CMV negative.  Transverse colon biopsy showed ulceration.  Transverse biopsies all showed increased lymphoid infiltration.  Cecum biopsy showed ulcers with large atypical cells possibly due to reactive atypia less likely lymphoma.  No signs of melanoma.    ASSESSMENT:  1.  Crohn's colitis.  This represents a new diagnosis for him.  After reviewing the images and the history, I suspect this has been present at least since 2019.  Currently mildly symptomatic with increased stool frequency.  Will check CRP today.  Reviewed pathogenesis and prognosis of Crohn's disease.  In general, I think he has a relatively good prognosis given his minimal symptoms and slow progression.  Treatment is usually focused on immunosuppressive therapy.  Given his ongoing melanoma treatment, I am reluctant to initiate systemic immunosuppression such as with thiopurines, methotrexate, anti-TNF therapy or anti-IL12 p40 therapy.  Since his symptoms are mild, we will start with sulfasalazine.  While this is not robustly effective for Crohn's, I think it is worth a try.  If no symptomatic or biochemical improvement after 3 to 4 weeks, then consider vedolizumab.  Anticipate restaging colonoscopy in about a year.    I spent 50 minutes with the patient in direct face-to-face counseling.    RECOMMENDATIONS AND PLAN:  There are no Patient Instructions on file for this visit.

## 2019-12-28 ENCOUNTER — Telehealth: Admit: 2019-12-28 | Discharge: 2019-12-29 | Payer: PRIVATE HEALTH INSURANCE | Attending: Clinical | Primary: Clinical

## 2019-12-28 DIAGNOSIS — F331 Major depressive disorder, recurrent, moderate: Principal | ICD-10-CM

## 2019-12-28 NOTE — Unmapped (Signed)
[facility]  Psychiatry  Psychotherapy Note - Telehealth via??Video  ??  Service Date:??December 14,??2021  Service:??50??minutes of??psychotherapy via??video??session  Time with Patient:??50??minutes  ??  Encounter Description:??This encounter was conducted from provider's office via EPIC video session due to COVID-19 pandemic. Jaimin Krupka was located in his home. Rationale for??video session??is need to social distance. See Plan for telemedicine consent/disclaimer.   ??  Encounter Description/Consent:??  [name] visit was completed through telehealth encounter (video).   ????  This patient encounter is appropriate and reasonable under the circumstances. The patient has been advised of the potential risks and limitations of this mode of treatment (including, but not limited to, the absence of in-person examination) and has agreed to be treated in a remote fashion in spite of them. Any and all of the patient's/patient's family's questions on this issue have been answered.     The patient was physically located in [place] in which I am permitted to provide care. The patient??understood that??he may incur co-pays and cost sharing, and agreed to the telemedicine visit. The visit was??reasonable and appropriate under the circumstances given the patient's presentation at the time.   ????  Time Spent: 50??minutes  ??  Assessment:  Mr. Kruse a 44yo??male??with metastatic melanoma, h/o thyroid cancer, recent portal vein thrombosis, who I see in therapy to address depression and enhance coping with??multiple??stressors.????I met with pt for session??today??via telehealth (video).????Mr. Stoutenburg described feeling a bit overwhelmed as he is set to begin radiation therapy and having anxieties about his prognosis.  These seem largely appropriate given the situation and he denied any emergent concerns. ??I offered psychotherapy, support,??and recommendations. ??I??will??see Mr. Bonanno??next in three weeks.  ??  Risk Assessment:  A suicide and violence risk assessment was performed as part of this evaluation. There patient is deemed to be at chronic elevated risk??for self-harm/suicide given the following factors: current diagnosis of depression, suicidal ideation or threats without a plan and past diagnosis of depression. There patient is deemed to be at chronic elevated risk??for violence given the following factors: male gender. These risk factors are mitigated by the following factors: lack of active SI/HI. There is no acute risk for suicide or violence at this time. The patient was educated about relevant modifiable risk factors including following recommendations for treatment of psychiatric illness and abstaining from substance abuse.??While future psychiatric events cannot be accurately predicted, the patient does not currently require acute inpatient psychiatric care and does not currently meet [facility] involuntary commitment criteria.??????????????????  ??  Plan:  1.??I will continue to see pt for psychotherapy. ??Next session??is??scheduled??to take place??in ~ three weeks  2. Pt??has??information on available CCSP resources.  3.??Pt followed by Dr. Lawernce Keas of Psychiatry.  4. Pt has??my contact information and knows??to reach me as needed.  ??  Subjective:??  Mr. Kross presented as alert and oriented.????He was engaged and cooperative throughout the session??as usual.????Pt did not present??as??anxious or??with depressed mood.    Mr. Biller reported on the news that his recent spinal tap and MRI results came back negative. He noted feeling relief and that he will begin receiving whole brain radiation next week. Pt stated that the prospect of radiation in the context of a challenging prognosis has left him feeling a bit overwhelmed, although denied pressing concerns and further description of mindset suggest that he is coping wnl in this respect.  Mr. Hopping endorsed anxiety in the form of rumination about his prognosis; some rumination, and noted that the thinks often about whether he will  have time to carry about dreams and hopes (e.g., traveling by RV).    ??  Pt??continues to take Cymbalta as prescribed by??Dr. Lawernce Keas.  ??  I provided??Mr. Warmuth with??support and understanding, and we discussed and processed??his??thoughts and feelings in depth.????We reviewed??and problem-solved cognitive and behavioral??approaches to addressing and tolerating distress, as well as enhancing adaptive coping.  We worked through realistic options of him to achieve his dreams and hopes after he completes radiation.????As always, Mr. Quevedo??was engaged and receptive.????My sense??remains??that given the stressors on hand, he has actually coping relatively well overall. ??I will continue to see him for therapy, and will meet next after the new year.  ??  Mental Status Exam:  Appearance:  ?? ??Appears stated age   Motor: ?? ??No abnormal movements   Speech/Language:  ?? ??Normal rate, volume, tone, fluency   Mood: ?? ??okay a bit overwhelmed   Affect: ?? ??engaged; pleasant   Thought process: ?? ??Logical, linear, clear, coherent, goal directed   Thought content: ?? ?? ??Denies SI/HI??or thoughts of??self-harm   Perceptual disturbances: ?? ?? ??Behavior not concerning for response to internal stimuli  ??   Orientation: ?? ??Oriented to person, place, time, and general circumstances   Attention: ?? ??Able to fully attend without fluctuations in consciousness   Concentration: ?? ??Able to fully concentrate and attend   Memory: ?? ??Immediate, short-term, long-term, and recall grossly intact   ??Fund of knowledge:  ?? ??Consistent with level of education and development   Insight: ?? ?? ??Intact   Judgment:  ?? ??Intact   Impulse Control: ?? ??Intact   ??  Diagnosis: Recurrent major depressive disorder  ??  [name]. Taniyah Ballow PhD  December 15,??2021

## 2019-12-29 MED FILL — BRAFTOVI 75 MG CAPSULE: 30 days supply | Qty: 180 | Fill #2 | Status: AC

## 2019-12-29 MED FILL — SULFASALAZINE 500 MG TABLET: 30 days supply | Qty: 240 | Fill #2 | Status: AC

## 2019-12-29 MED FILL — SULFASALAZINE 500 MG TABLET: 30 days supply | Qty: 240 | Fill #2

## 2019-12-29 MED FILL — MEKTOVI 15 MG TABLET: 30 days supply | Qty: 180 | Fill #2 | Status: AC

## 2019-12-29 MED FILL — MEKTOVI 15 MG TABLET: ORAL | 30 days supply | Qty: 180 | Fill #2

## 2019-12-29 MED FILL — BRAFTOVI 75 MG CAPSULE: ORAL | 30 days supply | Qty: 180 | Fill #2

## 2019-12-30 ENCOUNTER — Encounter: Admit: 2019-12-30 | Discharge: 2019-12-31 | Payer: PRIVATE HEALTH INSURANCE

## 2019-12-30 DIAGNOSIS — C439 Malignant melanoma of skin, unspecified: Principal | ICD-10-CM

## 2019-12-31 NOTE — Unmapped (Signed)
OUTPATIENT ONCOLOGY PALLIATIVE CARE    Principal Diagnosis: Kenneth Fitzgerald is a 44 y.o. 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 Disease sites include metastases to brain and abdominal/pelvic lymph nodes.     Assessment/Plan:   1. Fatigue/Insomnia: Patient endorses improved sleep since his work schedule has adjusted, and he is no longer on night call.  In the past, he endorsed improved sleep associated with melatonin use.  Plan:  -Advised patient that it is safe to continue melatonin 3mg  at bedtime and take as needed  -defer option for OSA work-up to his PCP    2. Depression/Anxiety: Patient is currently on Cymbalta and reports good response.  Plan:  -Continue Cymbalta 30 mg twice daily per psychiatry    3. Coping: Patient endorses ups and downs since being diagnosed with metastatic melanoma.  He has had difficulty recently due to friends dying from cancer, and he currently has a cousin with GBM on hospice care.  He follows with [facility] as well.  Plan:  -Will continue to support Kenneth Fitzgerald and his coping    4. Advance Care Planning: Patient has an old advance directive scanned into chart, which has his father listed as his healthcare decision maker (who is now deceased).  He states that his sister, [name], is now his designated Oceanographer.  He has been given a copy of the [place] advance directive (prepare for your care) at a prior visit.  Plan:  -Will follow up at future visit on Kenneth Fitzgerald's thoughts and willingness to complete another advanced directive    F/u: 2-3 months; in conjunction with Oncology or RadOnc appointments    ----------------------------------------  Referring Provider: Dr. Lendell Caprice  Oncology Team: Dr. Nedra Hai, Dr. Despina Hick  PCP: Ralene Cork, DO      HPI:  44 year old white male with metastatic melanoma (Mets to lymph nodes and brain), history of thyroid cancer, history of portal vein thrombosis, and depression who presents for initial palliative care visit.  Dr. Nedra Hai with heme-onc referred patient for symptom management.  Patient also follows with Dr. Willaim Bane and Dr. Electa Sniff for his depression and anxiety.  Patient reports that in general he has been doing okay, but he has had decreased energy and fatigue.  Patient notes he works approximately 60 to 70 hours/week.  His fatigue has been worsening over the last few months.  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 In regards to patient's prognosis, he states that he doesn't want to know timeline unless his death is imminent.    Current cancer-directed therapy: Arva Chafe    Interval history, 09/17/2019, [place]: Follow-up.  This is a video visit.  ???Kenneth Fitzgerald reports that use of melatonin 3 mg has been helpful for his sleep.  On his best night he slept 6 and half hours and his worst night after taking melatonin was 4-1/2 hours of sleep.  Prior to taking melatonin he notes that he was sleeping only 1 to 1-1/2 hours per night.  He does note that he has been taking night call for work which frequently interrupts his sleep and he does not take melatonin on most nights.  He hopes to cut down on work hours and is applying for disability.  ???States mood is good.  Takes Cymbalta 30 mg twice a day.  ???Had colonic biopsies  which were negative for malignancy though raise possibility of inflammatory bowel disease.  Plan for consultation.  ???Kenneth Fitzgerald states that he is overall functioning well.  He looks forward to playing in a band this weekend at his church and enjoying the Labor Day weekend.    Interval history, 12/31/19 Follow-up (video visit):   Today, Kenneth Fitzgerald notes that he is doing okay, but he has some anxiety about starting radiation next week.  He notes that he has been through radiation before, so he is prepared for having to wear the mask during the treatments; he notes that he is scheduled to get 10 treatments this course.  He notes that he recently changed position at work, so his hours are better and he is not doing as many nights and weekends.  He endorses improved sleep recently, noting that he only occasionally gets up during the night, but nothing like he used to.  He has not been taking melatonin recently due to his improved sleep and work schedule adjustments, but he still has melatonin in case he feels like he needs to take again.  Lately, he notes that he has been dealing with some vision trouble, stating that he is not able to focus on things in the distance as well as he used to, which he thinks is age-related.  He also notes having some dizziness and headaches, which he thinks is related to the sulfasalazine, since his symptoms have been occurring since he started that medication.  He denies any falls, stating that he will sit down or hold on to something if he feels unsteady from the dizziness.  He notes that his abdominal pain is doing much better, and he denies any nausea or vomiting.  He endorses a good appetite and regular bowel movements.  He notes that his mood has been doing okay, but he has had a rough time recently due to a few friends passing away from cancer.  He also notes that he has a male cousin with GBM currently on hospice care, and he has been trying to support his cousin's wife in his care, but he notes it has been difficult to see his cousin go through this.  He notes that he has many friends who have been offering to help him, but he feels like he does not need anything at the moment.  He is going to visit his [place] the [place] this weekend to celebrate Christmas before he starts his radiation treatments.  He notes that sometimes, he feels like it always on his mind, but he is trying to deal with that the best he can.  He notes that sometimes are heavy, and some times he does not think about his cancer at all.  He tries to do things to take his mind off it during the heavy times, such as playing with his dog.    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  Hobbies: playing fetch with his dog, [name] (14 year old labradoodle); plays bass in band at church every couple of weeks  Current residence / distance from [facility]: [place], [place] (42 miles from [facility] hospitals)    Advance Care Planning:   HCPOA: sister, [name]  Natural surrogate decision maker: sister, [name] & brother, Hilario Robarts  Living Will: yes  ACP note: no    Objective     Oncology History   Melanoma (CMS-HCC)   03/17/2013 Initial Diagnosis    Melanoma (CMS-HCC)     02/12/2018 - 03/12/2018 Chemotherapy    OP NIVOLUMAB 480 MG Q4W  nivolumab 480 mg every 28 days     Malignant neoplasm of brain, unspecified location (CMS-HCC)   12/22/2019 Initial Diagnosis    Malignant neoplasm of brain, unspecified location (CMS-HCC)     12/22/2019 -  Radiation    Radiation Therapy Treatment Details (Noted on 12/22/2019)  Site: Brain  Technique: IMRT  Goal: No goal specified  Planned Treatment Start Date: No planned start date specified         Patient Active Problem List   Diagnosis   ??? 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 Malignant neoplasm of thyroid gland (CMS-HCC)   ??? Postoperative hypothyroidism   ??? Hypocalcemia   ??? Thyroid cancer (CMS-HCC)   ??? Melanoma (CMS-HCC)   ??? Ventral hernia with bowel obstruction   ??? Small bowel obstruction (CMS-HCC)   ??? Insomnia   ??? Crohn's disease of large intestine without complication (CMS-HCC)   ??? Malignant neoplasm of brain, unspecified location (CMS-HCC)       Past Medical History:   Diagnosis Date   ??? Cancer (CMS-HCC)     melanoma, thyroid cancer   ??? Disease of thyroid gland     hypothyroid   ??? Hypothyroidism    ??? Skin cancer        Past Surgical History:   Procedure Laterality Date   ??? PR COLONOSCOPY W/BIOPSY SINGLE/MULTIPLE N/A 01/09/2018    Procedure: COLONOSCOPY, FLEXIBLE, PROXIMAL TO SPLENIC FLEXURE; WITH BIOPSY, SINGLE OR MULTIPLE;  Surgeon: Vonda Antigua, MD;  [facility];  Service: Gastroenterology   ??? PR COLONOSCOPY W/BIOPSY SINGLE/MULTIPLE N/A 08/24/2019    Procedure: COLONOSCOPY, FLEXIBLE, PROXIMAL TO SPLENIC FLEXURE; WITH BIOPSY, SINGLE OR MULTIPLE;  Surgeon: Leland Her, MD;  Location: [facility];  Service: Gastroenterology   ??? PR COLSC FLX WITH DIRECTED SUBMUCOSAL NJX ANY SBST N/A 08/24/2019    Procedure: COLONOSCOPY, FLEXIBLE, PROXIMAL TO SPLENIC FLEXURE; WITH DIRECTED SUBMUCOSAL INJECTION(S), ANY SUBSTANCE;  Surgeon: Leland Her, MD;  Location: [facility];  Service: Gastroenterology   ??? PR IMPLANT MESH HERNIA REPAIR/DEBRIDEMENT CLOSURE N/A 01/03/2015    Procedure: IMPLANTATION OF MESH/OTHER PROSTHES INCISION/VENTRAL HERNIA REPAIR/MESH CLOSE DEBRID NECROT SOFT TIS INFECT;  Surgeon: Romero Belling, MD;  Location: MAIN OR [place];  Service: Gastrointestinal   ??? PR LAP, VENTRAL HERNIA REPAIR,REDUCIBLE N/A 04/07/2014    Procedure: LAPAROSCOPY, SURGICAL, REPAIR, VENTRAL, UMBILICAL, SPIGELIAN OR EPIGASTRIC HERNIA, REDUCIBLE;  Surgeon: Romero Belling, MD;  Location: MAIN OR [place];  Service: Gastrointestinal   ??? PR REMOVAL NODES, NECK,CERV CMPLT Left 12/07/2012    Procedure: CERVICAL LYMPHADENECTOMY (COMPLETE);  Surgeon: Charlott Rakes, MD;  Location: MAIN OR [facility];  Service: Surgical Oncology   ??? PR REMOVAL NODES, NECK,CERV MOD RAD Right 03/29/2013    Procedure: CERVICAL LYMPHADENECTOMY (MODIFIED RADICAL NECK DISSECTION);  Surgeon: Charlott Rakes, MD;  Location: MAIN OR [facility];  Service: Surgical Oncology   ??? PR REPAIR RECURR INCIS HERNIA,REDUC N/A 01/03/2015    Procedure: REPAIR RECURRENT INCISIONAL  OR VENTRAL HERNIA; REDUCIBLE;  Surgeon: Romero Belling, MD;  Location: MAIN OR [facility];  Service: Gastrointestinal   ??? PR REPAIR RECURR INCIS HERNIA,STRANG N/A 06/21/2016    Procedure: REPAIR RECURRENT INCISIONAL OR VENTRAL HERNIA; INCARCERATED OR STRANGULATED;  Surgeon: Mickle Asper, MD;  Location: MAIN OR [place];  Service: Gastrointestinal   ??? PR SIGMOIDOSCOPY,FINE NEEDL BX,US GUIDED N/A 01/09/2018    Procedure: SIGMOIDOSCOPY, FLEXIBLE, W/TRANSENDOSCOPIC ULTRASOUND GUIDED NEEDLE ASPIRATION;  Surgeon: Vonda Antigua, MD;  [facility];  Service: Gastroenterology       Current Outpatient Medications   Medication Sig Dispense Refill   ??? binimetinib ([name]) 15 mg tablet Take 3 tablets (45 mg total) by mouth Two (2) times a day. 180 tablet 11   ??? blood sugar diagnostic Strp Dispense 100 blood glucose test strips, ok to sub any brand preferred by insurance/patient, use 3x/day; dispense whatever brand matches with meter. 100 strip 12   ??? blood-glucose meter kit Use as instructed; dispense 1 meter, whatever is preferred by insurance 1 each 1   ??? calcitrioL (ROCALTROL) 0.25 MCG capsule Take 1 capsule (0.25 mcg total) by mouth daily. 30 capsule 11   ??? calcium carbonate 650 mg calcium (1,625 mg) tablet Take 1 tablet by mouth Three (3) times a day with a meal.     ??? DULoxetine (CYMBALTA) 30 MG capsule Take 1 capsule (30 mg total) by mouth Two (2) times a day. 60 capsule 3   ??? encorafenib (BRAFTOVI) 75 mg capsule Take 6 capsules (450 mg total) by mouth daily. 180 capsule 11   ??? fexofenadine (ALLEGRA ALLERGY) 180 MG tablet Take 180 mg by mouth daily as needed.      ??? folic acid (FOLVITE) 1 MG tablet Take 1 tablet (1 mg total) by mouth daily. 30 tablet 11   ??? ibuprofen (ADVIL,MOTRIN) 200 MG tablet Take 600 mg by mouth daily as needed for pain.     ??? lancets Misc Dispense 100 lancets, ok to sub any brand preferred by insurance/patient, use 3x/day 100 each 12   ??? levothyroxine (SYNTHROID) 125 MCG tablet Taking LT4 250 mcg x 4 days/week, and 375 mcg x 3 days/week 210 tablet 4   ??? melatonin 3 mg Tab Take 1 tablet (3 mg total) by mouth every evening. 90 tablet 1   ??? memantine (NAMENDA) 5 MG tablet Take 5mg  daily for 1 week, 5mg  twice daily for week 2, 10mg  in the morning and 5mg  in the evening for week 3, 10mg  twice daily for week 4. Continue 10mg  twice daily for a total of 6 months. 630 tablet 0   ??? metFORMIN (GLUCOPHAGE) 500 MG tablet Take 1 tablet (500 mg total) by mouth 2 (two) times a day with meals. 180 tablet 3   ??? sulfaSALAzine (AZULFIDINE) 500 mg tablet Take 2 tablets (1000mg ) by mouth twice daily for 7 days, then take 4 tablets (2000mg ) twice daily thereafter. 240 tablet 11     No current facility-administered medications for this visit.       Allergies:   Allergies   Allergen Reactions   ??? Compazine [Prochlorperazine] Itching   ??? Coconut Nausea And Vomiting   ??? Multihance [Gadobenate Dimeglumine] Other (See Comments)     Patient sneezed immediately after administration of contrast.       Family History:  Cancer-related family history includes Colon cancer in his paternal grandmother; Prostate cancer in his father.  He indicated that the status of his mother is unknown. He indicated that  the status of his father is unknown. He indicated that the status of his paternal grandmother is unknown. He indicated that the status of his neg hx is unknown.      REVIEW OF SYSTEMS:  A comprehensive review of 10 systems was negative except for pertinent positives noted in HPI.      PHYSICAL EXAM: - video visit  GEN: Awake and alert, comfortable appearing male in no acute distress  Pulm: Nonlabored respirations  Neuro: Alert, normal speech  Psych: Appropriate mood and affect    Lab Results   Component Value Date    CREATININE 1.14 11/25/2019     Lab Results   Component Value Date    ALKPHOS 58 11/25/2019    BILITOT 0.3 11/25/2019    BILIDIR 0.40 11/24/2018    PROT 7.1 11/25/2019    ALBUMIN 4.2 11/25/2019    ALT 20 11/25/2019    AST 25 11/25/2019            I personally spent 30 minutes face-to-face and non-face-to-face in the care of this patient, which includes all pre, intra, and post visit time on the date of service.     Payton Doughty, MD, HPM Fellow  [facility] Outpatient Oncology Palliative Care

## 2020-01-01 NOTE — Unmapped (Signed)
REASON FOR VISIT:  Colitis     HISTORY OF PRESENT ILLNESS:  Since last visit,  09/30/2019, CRP 14 mg/L  11/23/2019, reported hematemesis.  Recommended EGD. 11/25/2019, MRI abdomen.  Extensive motion artifact.  Stable metastatic peritoneal implants, indeterminate L2 lesion, right portal vein thrombosis.  No signs of bowel inflammation.  11/25/2019, CBC, CMP normal except hemoglobin 12.8  12/14/2019, MRI of spine.  Multilevel degenerative changes.  Moderate to severe bilateral neural foraminal stenosis at L5-S1.    Patient reports increasing numbers of melanoma lesions in brain on MRI.  Had brain radiation today.    Last week, sudden onset swelling and stiffness of right index finger that lasted 1-2 days. Took ibuprofen with prompt resolution. Left wrist swelling, pain, stiffness x 2-3 days. Improving now. Did not take ibuprofen for it.  Most recent episode of joint pains 10 years ago.  No n/v/d/hematemesis. 1-2 solid bms per day. No n/v/ap/anal pain.  Took 2g SSZ per day and then increased to 4g and experienced dizziness so then returned back to 2g.  Less abdominal pain since starting SSZ.    MEDICATIONS:  has a current medication list which includes the following prescription(s): binimetinib, blood sugar diagnostic, blood-glucose meter, calcitriol, calcium carbonate, duloxetine, encorafenib, allegra allergy, folic acid, ibuprofen, lancets, levothyroxine, melatonin, memantine, metformin, and sulfasalazine.    ALLERGIES:  Allergies as of 01/03/2020 - Reviewed 12/23/2019   Allergen Reaction Noted   ??? Compazine [prochlorperazine] Itching 09/21/2011   ??? Coconut Nausea And Vomiting 05/14/2012   ??? Multihance [gadobenate dimeglumine] Other (See Comments) 01/28/2019       PAST MEDICAL HISTORY:  1.  Crohn's colitis.  01/09/2018, colonoscopy done for melanoma staging incidentally showed chronic active colitis in transverse colon.  No diagnosis or treatment was made.  01/2018, immunotherapy for melanoma.  03/2018, bloody diarrhea thought to be due to immunotherapy.  Stopped immunotherapy starting prednisone with improvement in symptoms.  07/2019, slight worsening of bowel frequency.  PET/CT showed increased uptake in transverse colon.  08/24/2019, colonoscopy showed signs of Crohn's colitis.  09/30/2019, start sulfasalazine.  2.  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  Treated with immunotherapy in 01/2018 which caused diarrhea prompting cessation.  Currently on targeted oral therapy.  3.  Thyroid cancer diagnosed incidentally on PET/CT done for melanoma in 2013.  Status post thyroidectomy with 2 recurrences.  4.  Recurrent diverticulitis in 2012.  Sigmoid colectomy and June 2012 with pathology showing acute and chronic inflammatory changes as well as perforated diverticulitis.  5.  Ankylosing spondylitis diagnosed 2010.  Treated with prednisone and Humira for 1 year.  Also took sulfasalazine.  Stopped Humira due to melanoma diagnosis.  6.  Ventral hernias.  7.  Depression  8.  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    SOCIAL HISTORY:  Lives alone with his dog.  Drives trucks.  Has brothers and sisters who live in [place].    FAMILY HISTORY:  No IBD or colon cancer    PHYSICAL EXAM:  There were no vitals taken for this visit.  Wt Readings from Last 4 Encounters:   01/03/20 (!) 150.4 kg (331 lb 9.2 oz)   01/03/20 (!) 150.4 kg (331 lb 9.6 oz)   12/23/19 (!) 147.1 kg (324 lb 4.8 oz)   11/25/19 (!) 144.9 kg (319 lb 8 oz)     CONSTITUTIONAL: awake, alert, NAD  EYES: no scleral icterus or conjunctival injection  MUSCULOSKELETAL: normal muscle tone, no knee or finger joint effusions  NEUROLOGIC: oriented x  3  SKIN: warm, dry, without rash    TEST DATA:  1.  01/09/2018, colonoscopy to IC valve.  Colocolonic anastomosis in the rectum.  Transverse colon diverticulosis, granularity.  Biopsies showed chronic active colitis with no CMV  2.  05/12/2019, PET/CT.  Increased uptake in soft tissue density in transverse colon. Multiple tiny peritoneal implants.  Status post sigmoid colectomy.  Multiple ventral hernias.  Stable enlarged hypermetabolic abdominal and pelvic lymph nodes and peritoneal implants.  3.  08/05/2019, CBC, CMP normal  4.  08/24/2019, colonoscopy to terminal ileum.  Internal hemorrhoids.  Colocolonic anastomosis in the rectum.  Pancolonic diverticulosis left> right.  Patchy erythema, granularity, edema, ulcers, pseudopolyps from 0-58 cm.  Tattoo placed 5 cm distal to area of pseudopolyps.  Dark mucosa in mid transverse colon.  Normal ileum.  Transverse colon biopsy normal.  Transverse colon mass biopsy showed moderate chronic active colitis, CMV negative.  Transverse colon biopsy showed ulceration.  Transverse biopsies all showed increased lymphoid infiltration.  Cecum biopsy showed ulcers with large atypical cells possibly due to reactive atypia less likely lymphoma.  No signs of melanoma.  5. 11/25/2019, MRI abdomen.  Extensive motion artifact.  Stable metastatic peritoneal implants, indeterminate L2 lesion, right portal vein thrombosis.  No signs of bowel inflammation.    ASSESSMENT:  1.  Crohn's colitis.  Currently, her GI symptoms are in remission on low-dose sulfasalazine.  However, joints are bothering him.  This could be related to IBD associated arthropathy.  We will try to uptitrate sulfasalazine to help joints.  Would like to restage disease activity with a sigmoidoscopy at the time of his upcoming EGD.  He only needs to take enemas for bowel prep.  I do not think the benefits of a full colonoscopy are worth the risk for the hassle with the patient.  2.  Remote episode of hematemesis.  Resolved.  Probably benign etiology such as NSAID gastritis, Mallory-Weiss, Dieulafoy's.  However, could also be melanoma of the upper GI tract.  EGD scheduled at [facility] on January 27 to evaluate for etiology.  3.  New onset arthralgias.  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  Increase sulfasalazine as noted above. Okay to use low-level NSAIDs only if needed.  Cautioned him that overuse could precipitate worse colitis or upper GI bleeding.  Celecoxib would be an alternative if sulfasalazine does not work.    RECOMMENDATIONS AND PLAN:  Patient Instructions   -Try increasing sulfasalazine to 1g by mouth 3 times per day.  -If tolerating after 1 week, then try increasing to 1.5g in the morning, 1g in the midday, and 1.5 in the evening.  -If side effects recur, then reduce to highest tolerated dose.  -Ok to to take ibuprofen as needed for joint pains, but try to minimize use.  Would recommend not taking more than four 600mg  doses in a given week.  If needed, I could send in prescription for celebrex instead.  -Upper endoscopy as scheduled on 1/27.  I will add on a sigmoidoscopy to that procedure.  Take 1 saline enema before driving to procedure. No need to take any oral bowel prep even if they send it to you.  -Clinic with me in 4 months or sooner if needed.

## 2020-01-03 ENCOUNTER — Ambulatory Visit: Admit: 2020-01-03 | Discharge: 2020-01-04 | Attending: Radiation Oncology | Primary: Radiation Oncology

## 2020-01-03 ENCOUNTER — Encounter
Admit: 2020-01-03 | Discharge: 2020-01-03 | Payer: PRIVATE HEALTH INSURANCE | Attending: Gastroenterology | Primary: Gastroenterology

## 2020-01-03 ENCOUNTER — Non-Acute Institutional Stay: Admit: 2020-01-03 | Discharge: 2020-01-03 | Payer: PRIVATE HEALTH INSURANCE

## 2020-01-03 ENCOUNTER — Encounter: Admit: 2020-01-03 | Discharge: 2020-01-03 | Payer: PRIVATE HEALTH INSURANCE

## 2020-01-03 DIAGNOSIS — R739 Hyperglycemia, unspecified: Principal | ICD-10-CM

## 2020-01-03 DIAGNOSIS — Z6841 Body Mass Index (BMI) 40.0 and over, adult: Principal | ICD-10-CM

## 2020-01-03 DIAGNOSIS — E890 Postprocedural hypothyroidism: Principal | ICD-10-CM

## 2020-01-03 DIAGNOSIS — K5010 Crohn's disease of large intestine without complications: Principal | ICD-10-CM

## 2020-01-03 DIAGNOSIS — C73 Malignant neoplasm of thyroid gland: Principal | ICD-10-CM

## 2020-01-03 LAB — CALCIUM: CALCIUM: 8.2 mg/dL — ABNORMAL LOW (ref 8.7–10.4)

## 2020-01-03 LAB — ALBUMIN: ALBUMIN: 3.9 g/dL (ref 3.4–5.0)

## 2020-01-03 LAB — HEMOGLOBIN A1C
ESTIMATED AVERAGE GLUCOSE: 120 mg/dL
HEMOGLOBIN A1C: 5.8 % — ABNORMAL HIGH (ref 4.8–5.6)

## 2020-01-03 LAB — TSH: THYROID STIMULATING HORMONE: 26.788 u[IU]/mL — ABNORMAL HIGH (ref 0.550–4.780)

## 2020-01-03 LAB — T4, FREE: FREE T4: 0.88 ng/dL — ABNORMAL LOW (ref 0.89–1.76)

## 2020-01-03 LAB — PHOSPHORUS: PHOSPHORUS: 5.5 mg/dL — ABNORMAL HIGH (ref 2.4–5.1)

## 2020-01-03 NOTE — Unmapped (Signed)
Take metformin once a day at minimum  Let me know if you notice higher sugars or if you have to start prednisone  I'll be in touch about your thyroid and calcium labs

## 2020-01-03 NOTE — Unmapped (Addendum)
-  Try increasing sulfasalazine to 1g by mouth 3 times per day.  -If tolerating after 1 week, then try increasing to 1.5g in the morning, 1g in the midday, and 1.5 in the evening.  -If side effects recur, then reduce to highest tolerated dose.  -Ok to to take ibuprofen as needed for joint pains, but try to minimize use.  Would recommend not taking more than four 600mg  doses in a given week.  If needed, I could send in prescription for celebrex instead.  -Upper endoscopy as scheduled on 1/27.  I will add on a sigmoidoscopy to that procedure.  Take 1 saline enema before driving to procedure. No need to take any oral bowel prep even if they send it to you.  -Clinic with me in 4 months or sooner if needed.

## 2020-01-03 NOTE — Unmapped (Signed)
Assessment/Plan:    1. T1N1b papillary thyroid cancer (classic/follicular variant), multifocal, s/p 3 surgeries and 2 doses of I-131. Last treatment was in 2015. Has indeterminate response to treatment based on persistent detectable Tg (< 1 on suppression, rose to 2.4 with stimulation in Jan 2018).  Note that he also has stage IV melanoma, followed by oncology.  -Thyrogen-stimulated WBS was negative in 01/2016; stimulated Tg was 2.4  -Tg on suppression has been stable, last check 08/2019, 0.3 at that time with negative antibody.  Repeat Tg/Ab ~ 08/2020.  -10/2018 neck US stable. Repeat ~ end of 2022.    2. Hypothyroidism, with TSH fluctuating between 0.06 and 23 in 2017-2018 on same doses of thyroid hormone, perhaps related to wt changes, absorption issues, and/or variable adherence at that time  - TFTs less labile since 2018, even in the setting of bowel obstruction in 2020  - Levels at or close to goal 08/2019, repeat today or in next few days, orders on file; **in future we may have to follow FT4 closely as well as TSH re: brain XRT x 10 days from 12/2019-01/2020  -Reviewed appropriate administration, and factors that can affect dose needs or laboratory measurement    3. Hypocalcemia, severe, started calcitriol 05/2015. Low calcium in 12/2017 d/t low calcium intake at that time. Normal level in 05/2018 and in 06/2018 off of calcitriol, but significantly low again in 07/2018. Has been back on calcitriol 0.25 mg/day since then.  -Last few Ca checks stable and at or close to goal, no significant symptoms  -Repeat Ca/albumin/Phos today or in next few days, orders on file;  ???Reviewed goals of treatment and symptoms or signs with which to take extra calcium and/or notify us.     4. Hyperglycemia, new in 03/2018, in the setting of 4 weeks of prednisone to treat a reaction to immunotherapy.  A1c 6.0% in 06/2018.  -A1c 08/2019 improved, started MTF at that time for possible help with wt loss and interest in preventing progression to frank DM.   -  Has been taking intermittently re: high pill burden d/t multiple medical issues, states he can take once daily so will start with that    Patient Instructions   Take metformin once a day at minimum  Let me know if you notice higher sugars or if you have to start prednisone  I'll be in touch about your thyroid and calcium labs    Return in about 4 months (around 05/03/2020).             Reason For Visit:   Chief Complaint   Patient presents with   ??? Follow-up thyroid cancer and hypoparathyroidism      Subjective:     History of Present Illness:  Kenneth Fitzgerald is a 44 y.o. male with PTC who was last seen 08/2019.     ?? 08/30/19 - visit, start MTF 500 mg BID, TFTs stable, Tg 0.3 (0.2-0.4 over past year) and Tg Ab neg, cont same LT4 dose, return 3-4 mo  ?? 9/16 - saw Dr. Stevphen [facility], *new dx Crohns, likely ongling since 2019 per note, defer immunosuppressive therapy re: active melanoma, start w sulfasalazine  ?? 10/28 - Ca 8.1  ?? 12/20 - next visit w me    Taking LT4 250 mcg x 4 days/week, and 375 mcg x 3 days/week. Good adherence. Takes in AM on empty stomach. No biotin supplements. No overt hypothyroid symptoms. No neck pain, swelling, globus, dysphagia, or dysphonia.  Taking calcitriol 0.25 mcg/day. Taking supplemental calcium 750 mg/day, lower than before.  Dietary calcium intake is milk. Has vit D in his ca supplements. No kidney stones. No recent significant hypoCa symptoms though does get cramping intermittently.     Continuing on oral medications for melanoma. Has had progression of brain lesions on recent imaging. Close monitoring by Heme Onc and Rad Onc. Started whole brain XRT this morning, will continue for total 10 days.     Started MTF 500 mg BID. However, he is not taking regularly re: taking 32+ pills for all his medical conditions, including new pill for memory to start today. Last checked BG a few weeks ago, running in 120s. No obvious GI effects when taking MTF regularly. Symptoms of Crohns, which were going on since ~ 2019, were no worse. He did start sulfasalazine as noted above.      Weight trend:  Wt Readings from Last 6 Encounters:   01/03/20 (!) 150.4 kg (331 lb 9.6 oz)   12/23/19 (!) 147.1 kg (324 lb 4.8 oz)   11/25/19 (!) 144.9 kg (319 lb 8 oz)   11/11/19 (!) 144.4 kg (318 lb 4.8 oz)   09/30/19 (!) 141.4 kg (311 lb 12.8 oz)   08/30/19 (!) 141 kg (310 lb 14.4 oz)       PMH:  1. 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 Thyroidectomy 06/25/2011 with 1 foci each lobe and 2/2 central LN (left Level 6)     1b. Thyrogen-based RAI 125 mCi 03/2012 with pre/post scans showing fairly large focus of bed activity and 4.2% uptake; pretx stimulated Tg of 73     1c. Recurrence on [place] 09/2012 in left thyroid bed and confirmed by FNA 10/2012; left ND in 11/2012 levels 3-6 with 12/16 positive (largest 1.8 cm, and some with extranodal extension)     1d. Right CND 03/2013 levels 2-4 with 3/20 positive (largest 3.5 mm), found incidentally as the right ND was performed for melanoma recurrence which was 1 cm node in 1 of 20 nodes     1e. Thyrogen-based 150 mCi in 05/2013 [LID as well before the treatment]      68f. Thyrogen-stimulated Tg was 2.4 in 01/2016. WBS in 01/2016 was negative.  2. Postsurgical hypothyroidism  3. Stage 3 melanoma - scalp lesion, surgery including RND, and systemic therapy ending 01/2012; recurrence in a 1 cm right LN removed 03/2013 on repeat RND; XRT completed 06/2013  4. Diverticulitis  5. H/o anklyosing spondylitis 2009  6.  Small bowel obstruction, hospitalization 11/2018, due to adhesions from past abdominal surgeries including hernia repairs  7. Elevated A1c of 6% in 06/2018     PSH reviewed in Epic      Current Outpatient Medications:   ???  binimetinib ([name]) 15 mg tablet, Take 3 tablets (45 mg total) by mouth Two (2) times a day., Disp: 180 tablet, Rfl: 11  ???  blood sugar diagnostic Strp, Dispense 100 blood glucose test strips, ok to sub any brand preferred by insurance/patient, use 3x/day; dispense whatever brand matches with meter., Disp: 100 strip, Rfl: 12  ???  blood-glucose meter kit, Use as instructed; dispense 1 meter, whatever is preferred by insurance, Disp: 1 each, Rfl: 1  ???  calcitrioL (ROCALTROL) 0.25 MCG capsule, Take 1 capsule (0.25 mcg total) by mouth daily., Disp: 30 capsule, Rfl: 11  ???  calcium carbonate 650 mg calcium (1,625 mg) tablet, Take 1 tablet by mouth Three (3) times a  day with a meal., Disp: , Rfl:   ???  DULoxetine (CYMBALTA) 30 MG capsule, Take 1 capsule (30 mg total) by mouth Two (2) times a day., Disp: 60 capsule, Rfl: 3  ???  encorafenib (BRAFTOVI) 75 mg capsule, Take 6 capsules (450 mg total) by mouth daily., Disp: 180 capsule, Rfl: 11  ???  fexofenadine (ALLEGRA ALLERGY) 180 MG tablet, Take 180 mg by mouth daily as needed. , Disp: , Rfl:   ???  folic acid (FOLVITE) 1 MG tablet, Take 1 tablet (1 mg total) by mouth daily., Disp: 30 tablet, Rfl: 11  ???  ibuprofen (ADVIL,MOTRIN) 200 MG tablet, Take 600 mg by mouth daily as needed for pain., Disp: , Rfl:   ???  lancets Misc, Dispense 100 lancets, ok to sub any brand preferred by insurance/patient, use 3x/day, Disp: 100 each, Rfl: 12  ???  levothyroxine (SYNTHROID) 125 MCG tablet, Taking LT4 250 mcg x 4 days/week, and 375 mcg x 3 days/week, Disp: 210 tablet, Rfl: 4  ???  melatonin 3 mg Tab, Take 1 tablet (3 mg total) by mouth every evening., Disp: 90 tablet, Rfl: 1  ???  memantine (NAMENDA) 5 MG tablet, Take 5mg  daily for 1 week, 5mg  twice daily for week 2, 10mg  in the morning and 5mg  in the evening for week 3, 10mg  twice daily for week 4. Continue 10mg  twice daily for a total of 6 months., Disp: 630 tablet, Rfl: 0  ???  metFORMIN (GLUCOPHAGE) 500 MG tablet, Take 1 tablet (500 mg total) by mouth 2 (two) times a day with meals., Disp: 180 tablet, Rfl: 3  ???  sulfaSALAzine (AZULFIDINE) 500 mg tablet, Take 2 tablets (1000mg ) by mouth twice daily for 7 days, then take 4 tablets (2000mg ) twice daily thereafter., Disp: 240 tablet, Rfl: 11      Allergies   Allergen Reactions   ??? Compazine [Prochlorperazine] Itching   ??? Coconut Nausea And Vomiting   ??? Multihance [Gadobenate Dimeglumine] Other (See Comments)     Patient sneezed immediately after administration of contrast.       ROS  No chest pain or SOB. No f/c.  No cough.  No fractures or falls.  No palpitations, chest pain, or shortness of breath with usual activity.  Remainder of 8 systems reviewed were negative except as noted in the history of present illness.    Social Hx:  -Living in [place].  -Working at [employer], a [employer]ar[employer] company.  -Nonsmoker    Family History   Problem Relation Age of Onset   ??? Hyperthyroidism Mother    ??? Osteoporosis Mother    ??? Arrhythmia Mother    ??? Squamous cell carcinoma Mother         basal cell vs squamous cell skin cancer   ??? Coronary artery disease Father         s/p CABG   ??? Diabetes Father    ??? Hypertension Father    ??? Prostate cancer Father    ??? Colon cancer Paternal Grandmother    ??? Thyroid disease Neg Hx    Father died 2017/02/23 with diabetes and heart disease  Mother died in 07/24/12    Objective:     Physical Exam:  BP 159/105  - Pulse 89  - Ht 188 cm (6' 2)  - Wt (!) 150.4 kg (331 lb 9.6 oz)  - BMI 42.57 kg/m??    BP Readings from Last 3 Encounters:   01/03/20 159/105   11/25/19 168/93  11/11/19 151/97     General appearance - Conversant, pleasant, NAD. Generalized and abdominal obesity.   Eyes -  No lid lag or stare, no proptosis  Neck - Supple w/o palpable LAD, no masses appreciated. Well healed incision from thyroidectomy.and previous LND.   Lymphatics - no cervical or supraclavicular adenopathy appreciated   Resp - clear to auscultation bilaterally  CV - normal rate, regular rhythm  Neurological - no hand tremors, 2+ upper extremity DTRs  Extremities - peripheral pulses normal, no lower extremity edema  Skin - warm, dry, no visible rashes.  No acanthosis.    Data Review:    TSH   Date Value   08/30/2019 1.748 uIU/mL   01/28/2019 2.684 uIU/mL   12/07/2018 3.563 uIU/mL (H)   10/04/2013 0.11 u[iU]/mL (L)   06/28/2013 0.28 u[iU]/mL (L)   04/27/2013 0.09 u[iU]/mL (L)     Free T4 (ng/dL)   Date Value   16/60/6301 1.13   01/28/2019 1.10   12/07/2018 1.20   10/04/2013 1.55 (H)   06/28/2013 1.40   04/27/2013 1.69 (H)     THYROGLOBULIN AB (IU/mL)   Date Value   08/30/2019 <1.8   06/22/2018 <1.8   12/17/2017 <1.8     Thyroglobulin, Tumor Marker, IA (ng/mL)   Date Value   08/30/2019 0.3 (H)   06/22/2018 0.2 (H)   12/17/2017 0.3 (H)      Lab Results   Component Value Date    CALCIUM 8.1 (L) 11/25/2019    CALCIUM 8.1 (L) 11/11/2019    CALCIUM 8.1 (L) 11/11/2019    PHOS 4.2 12/07/2018    PHOS 5.0 (H) 11/30/2018    PHOS 5.4 (H) 11/29/2018    CREATININE 1.14 11/25/2019    CREATININE 1.05 11/11/2019    CREATININE 1.1 11/11/2019    VITDTOTAL 31.0 02/27/2017    VITDTOTAL 37 12/04/2015    VITDTOTAL 34 12/12/2014    PTH 9.9 (L) 12/04/2015    PTH 12.4 12/12/2014    PTH 12 04/27/2013     Lab Results   Component Value Date    A1C 5.4 08/30/2019     Calcium values while hospitalized in 06/2016 were low at less than 7, and had low ionized calcium as well.     01/2016 Thyrogen stimulated testing  - Tg rose to 2.4    PATH from 03/2013 right neck dissection:  Amended: 03/31/2013 by Kirkland Hun, MD  Reason: Additional Information  Comment: The report is amended to add the results of a BRAF V600E immunostain.  The final diagnosis is unchanged.  Previous Signout Date: 03/30/2013  Diagnosis:  Lymph nodes, right cervical, levels 2-4, removal and partial parotidectomy  -Metastatic melanoma involving 1 out of 20 lymph nodes, with the largest diameter measuring 10 mm (1.0 cm) and no evidence of extracapsular extension (1/20)  -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, with the largest diameter measuring 3.5 mm and no evidence of extracapsular extension (3/20)   -BRAF V600E immunohistochemical staining is positive (3+ staining, 100% of cells) --> this was on LN with melanoma  -Benign parotid gland is also present  *Molecular testing:  RESULTS:  Gene Variants of Known Clinical Utility:   BRAF: No mutation detected (see note) --> A BRAF V600E immunohistochemical stain was performed on the tissue section of this case and was positive (see case [id number]). Though within validated parameters, the tumor input for sequencing in this case was low. This could result in the V600E mutant allele  frequency falling below the limit of detection for the assay. [Again on LN with melanoma]  KIT: No mutation detected     Radiology:    07/2019 - had MRI of the abdomen/pelvis, CT chest with contrast, MRI brain with contrast, PET CT    Results for orders placed during the hospital encounter of 10/28/18   US Soft Tissue Head And Neck    Narrative EXAM: US SOFT TISSUE HEAD AND NECK  DATE: 10/28/2018 9:20 AM  ACCESSION: [id number]  DICTATED: 10/28/2018 10:48 AM  INTERPRETATION LOCATION: [facility]    CLINICAL INDICATION: 44 years old Male with thyroid cancer  - C73 - Thyroid cancer (CMS - HCC)     COMPARISON: Thyroid ultrasound from 12/17/2017 and earlier examinations    TECHNIQUE:  Ultrasound views of the thyroid were obtained using gray scale and limited color Doppler imaging.    FINDINGS:  Status post total thyroidectomy. Redemonstration of heterogeneous hypoechoic nodule in the midline, superior to the thyroidectomy bed measuring 1.1 x 1 x 0.5 cm, previously 1.1 x 1.0 x 0.6 cm. No new soft tissues are noted in the thyroidectomy bed.    Lymph nodes: No adenopathy      Impression Status post total thyroidectomy. Unchanged 1.1 cm heterogeneous nodule in the midline superior to the thyroidectomy bed. Appearance is similar since 02/02/2015, likely nonaggressive process. Subcentimeter cervical chain lymph nodes measuring up to 0.7 cm and left level 2, previously 0.4 cm.    Please see below for data measurements:    Lymph node measurements:  Right I 0.48 cm  Right II 0.18 cm  Right V 0.14 cm    Left I 0.49 cm  Left II 0.68 cm  Left III 0.36 cm  Left IV 0.48 cm  Left V 0.29 cm   [Images reviewed 10/2018]    01/2016 Whole body scan:  FINDINGS: ??There are no abnormal foci of increased radiotracer uptake identified. ??There is physiologic uptake seen in the nasopharynx, the bowel, stomach, and bladder.  Impression  No evidence of I-131 avid metastasis.    05/2013 had stimulated PET and WBS, as well as 1 week post treatment WBS     Other Medical Data:

## 2020-01-04 ENCOUNTER — Ambulatory Visit: Admit: 2020-01-04 | Discharge: 2020-01-05

## 2020-01-04 DIAGNOSIS — E890 Postprocedural hypothyroidism: Principal | ICD-10-CM

## 2020-01-04 MED ORDER — LEVOTHYROXINE 125 MCG TABLET
ORAL_TABLET | 4 refills | 0.00000 days | Status: SS
Start: 2020-01-04 — End: 2020-01-20

## 2020-01-05 ENCOUNTER — Ambulatory Visit: Admit: 2020-01-05 | Discharge: 2020-01-06

## 2020-01-05 NOTE — Unmapped (Deleted)
RADIATION ON-TREATMENT MANAGEMENT NOTE     Encounter Date: 01/06/2020  Patient Name: Kenneth Fitzgerald [facility]  Medical Record Number: [id number]    DIAGNOSIS:  44 y.o. history of recurrent thyroid cancer, s/p RT to the neck in 2015 and??stage IV melanoma with stable extracranial disease on dabrafenib/trametinib and multiple punctate lesions (best seen on T1 noncontrast sequence)??in the brain suspicious for metastasis that have been increasing in number since January 2020.    ASSESSMENT: ***cGy of planned 3000cGy  Karnofsky/Lansky Performance Status: {Karnofsky/Lansky Performance Status:22908}  Tumor/Palliative response: {Treatment response options:21888}   Chemotherapy/Systemic therapy:{CHEMO ADMINISTRATION:25394}  Clinical Trial:   {YES /NO:25183}    RECOMMENDATIONS:  1. Plan for Therapy: {TREATMENT PLAN GOING FORWARD:[phone]}  2. Mucositis: {BSCOTVASSESSMENT:27103}  3. Skin/Dermatitis:  {BSCOTVASSESSMENT:27103}  4. Pain: {BSCOTVASSESSMENT:27103}  5. Nausea/Vomiting:  {BSCOTVASSESSMENT:27103}  6. Bowel Movements:  {BSCOTVASSESSMENT:27103}  7. Nutrition: {BSCOTVASSESSMENT:27103}   8. Memantine***      SUBJECTIVE: ***     PHYSICAL EXAM:  Vital Signs for this encounter:  There were no vitals taken for this visit.  Pain:  Pain Evaluation:                                   Pain Score (0 - 10):                            ([phone]::1)@  Pain Location:                                      Last weight:    Wt Readings from Last 4 Encounters:   01/03/20 (!) 150.4 kg (331 lb 9.2 oz)   01/03/20 (!) 150.4 kg (331 lb 9.6 oz)   12/23/19 (!) 147.1 kg (324 lb 4.8 oz)   11/25/19 (!) 144.9 kg (319 lb 8 oz)     General:  Alert and Oriented X 3.  No acute distress.  ***  Oral Cavity/Oropharynx:  ***  Neck: ***  Skin: ***      Electronically signed by:  Blythe Stanford, MD  Radiation Oncology Resident, PGY-3  [facility]  Pager: [phone]

## 2020-01-06 ENCOUNTER — Ambulatory Visit: Admit: 2020-01-06 | Discharge: 2020-01-07

## 2020-01-06 DIAGNOSIS — C7931 Secondary malignant neoplasm of brain: Principal | ICD-10-CM

## 2020-01-06 DIAGNOSIS — Z510 Encounter for antineoplastic radiation therapy: Principal | ICD-10-CM

## 2020-01-06 DIAGNOSIS — C73 Malignant neoplasm of thyroid gland: Principal | ICD-10-CM

## 2020-01-06 MED ORDER — DEXAMETHASONE 4 MG TABLET
ORAL_TABLET | 2 refills | 0 days | Status: SS
Start: 2020-01-06 — End: 2020-01-20

## 2020-01-06 MED ORDER — ONDANSETRON HCL 8 MG TABLET
ORAL_TABLET | Freq: Three times a day (TID) | ORAL | 2 refills | 10 days | Status: CP | PRN
Start: 2020-01-06 — End: 2020-02-05

## 2020-01-06 NOTE — Unmapped (Signed)
RTC with pt checking on rx status for sterioids and nausea meds that were to be ordered this morning related to med check visit this morning. Verified that pt desires [facility] in [place] for rx's. Advised pt I would send message to provider.

## 2020-01-06 NOTE — Unmapped (Signed)
[id number] Brai: 01/05/2020: 900/3,000 cGy  Dr. Tana Felts pt -Pt receiving whole brain treatment-RTC with pt reporting having more headaches; experienced nausea yesterday and vomited twice. On his way in for treatment, advised I will move his status check prior to treatment for symptom evaluation and treatment. Pt reports he is not on steriods; has zofran on hand but did not take it. Zofran use education done to control/manage n/v. States he understand and will check in upon arrival.

## 2020-01-06 NOTE — Unmapped (Signed)
TK:[id number] Brai: 01/05/2020: 900/3,000 cGy    01/06/2020      Subjective/Assessment/Recommendations:    1. Fatigue: mild  2. Pain: headache  3. Elimination: no issues  4. Prescription Needs: none  5. Psychosocial: has support  6. Other: pt has been having headaches since starting treatment and some nausea.

## 2020-01-06 NOTE — Unmapped (Signed)
RADIATION ON-TREATMENT MANAGEMENT NOTE     Encounter Date: 01/06/2020  Patient Name: Kenneth Fitzgerald [facility]  Medical Record Number: [id number]    DIAGNOSIS:  44 y.o. with??history of recurrent thyroid cancer, s/p RT to the neck in 2015 and??stage IV melanoma with stable extracranial disease on dabrafenib/trametinib and multiple punctate lesions (best seen on T1 noncontrast sequence)??in the brain suspicious for metastasis that have been increasing in number since January 2020.    ASSESSMENT: 900cGy of planned 3000cGy  Karnofsky/Lansky Performance Status: 90,  Able to carry on normal activity; minor signs or symptoms of disease (ECOG equivalent 0)  Tumor/Palliative response: Unable to assess   Chemotherapy/Systemic therapy:administered concurrently  Clinical Trial:   no    RECOMMENDATIONS:  1. Plan for Therapy: Continue treatment as planned. Continues Braftovi/[name].   2. Nausea/Vomiting/headache: likely related to brain metastases. Will prescribe 4mg  dexamethasone BID and 8mg  Zofran TID      SUBJECTIVE: Pt stated he experienced headaches, nausea, and vomiting for several days. No dizziness, new fatigue, weakness, numbness.        PHYSICAL EXAM:  Vital Signs for this encounter:  There were no vitals taken for this visit.  Pain:  Pain Evaluation:                                   Pain Score (0 - 10):                            [phone]::1)@  Pain Location:                                      Last weight:    Wt Readings from Last 4 Encounters:   01/03/20 (!) 150.4 kg (331 lb 9.2 oz)   01/03/20 (!) 150.4 kg (331 lb 9.6 oz)   12/23/19 (!) 147.1 kg (324 lb 4.8 oz)   11/25/19 (!) 144.9 kg (319 lb 8 oz)     General:  Alert and Oriented X 3.  No acute distress.    MSK/Neuro: Normal gait       Electronically signed by:  Blythe Stanford, MD  Radiation Oncology Resident, PGY-3  [facility]  Pager: [phone]

## 2020-01-07 NOTE — Unmapped (Signed)
Addended by: Doreatha Martin on: 01/07/2020 11:06 AM     Modules accepted: Level of Service

## 2020-01-10 ENCOUNTER — Ambulatory Visit: Admit: 2020-01-10 | Discharge: 2020-01-11

## 2020-01-11 ENCOUNTER — Ambulatory Visit: Admit: 2020-01-11 | Discharge: 2020-01-12

## 2020-01-12 ENCOUNTER — Ambulatory Visit: Admit: 2020-01-12 | Discharge: 2020-01-13

## 2020-01-13 ENCOUNTER — Ambulatory Visit: Admit: 2020-01-13 | Discharge: 2020-01-14

## 2020-01-13 DIAGNOSIS — C7931 Secondary malignant neoplasm of brain: Principal | ICD-10-CM

## 2020-01-13 DIAGNOSIS — Z510 Encounter for antineoplastic radiation therapy: Principal | ICD-10-CM

## 2020-01-13 DIAGNOSIS — C73 Malignant neoplasm of thyroid gland: Principal | ICD-10-CM

## 2020-01-13 NOTE — Unmapped (Signed)
01/13/2020    Dose Site Summary:Rx:Right Neck: 07/09/2013: 4,800/4,800 cGy  GN:FAOZH Brai: 01/13/2020: 2,400/3,000 cGy    Subjective/Assessment/Recommendations:    1. Nutrition: Eating well, Weight stable and nausea and heartburn  2. G-Tube Status: Not currently scheduled  3. Mucositis: Uncontrolled and Continue salt and soda rinses  4. Chemo: Not administered  5. Nausea/Vomiting: Uncontrolled and Taking anti-emetic medication  6. Skin: Intact, Erythematous and Continue emollients  7. Tobacco: N/A  8. Fatigue: Mild  9. Pain:Mild and Uncontrolled  10. Elimination: No issues  11. Prescription Needs: None  12. Psychosocial: Has home support    Wt Readings from Last 3 Encounters:   01/03/20 (!) 150.4 kg (331 lb 9.2 oz)   01/03/20 (!) 150.4 kg (331 lb 9.6 oz)   12/23/19 (!) 147.1 kg (324 lb 4.8 oz)     Wt Readings from Last 3 Encounters:   01/03/20 (!) 150.4 kg (331 lb 9.2 oz)   01/03/20 (!) 150.4 kg (331 lb 9.6 oz)   12/23/19 (!) 147.1 kg (324 lb 4.8 oz)     Temp Readings from Last 3 Encounters:   01/03/20 37.1 ??C (98.8 ??F)   12/23/19 36.2 ??C (97.2 ??F)   11/25/19 36.9 ??C (98.5 ??F) (Temporal)     BP Readings from Last 3 Encounters:   01/03/20 163/111   01/03/20 159/105   11/25/19 168/93     Pulse Readings from Last 3 Encounters:   01/03/20 90   01/03/20 89   11/25/19 75

## 2020-01-13 NOTE — Unmapped (Signed)
RADIATION ON-TREATMENT MANAGEMENT NOTE     Encounter Date: 01/13/2020  Patient Name: Kenneth Fitzgerald [facility]  Medical Record Number: [id number]    DIAGNOSIS:  44 y.o. with??history of recurrent thyroid cancer, s/p RT to the neck in 2015 and??stage IV melanoma with stable extracranial disease on dabrafenib/trametinib and multiple punctate lesions (best seen on T1 noncontrast sequence)??in the brain suspicious for metastasis that have been increasing in number since January 2020.    ASSESSMENT: 2400cGy of planned 3000cGy  Karnofsky/Lansky Performance Status: 90,  Able to carry on normal activity; minor signs or symptoms of disease (ECOG equivalent 0)  Tumor/Palliative response: Unable to assess   Chemotherapy/Systemic therapy:administered concurrently  Clinical Trial:   no    RECOMMENDATIONS:  1. Plan for Therapy: Continue treatment as planned. Continues Braftovi/[name].   2. Nausea/Vomiting/headache: likely related to brain metastases. Will prescribe 4mg  dexamethasone BID and 8mg  Zofran TID. Much better today.   3. On memantine. Seems to be tolerating well.   4. Patient completes radiation next week. We informed him that Dr. Despina Hick is leaving [facility]. Follow up will be deferred to his medical oncology team.     SUBJECTIVE:  No new symptoms. Tolerating treatment well.     PHYSICAL EXAM:  Vital Signs for this encounter:  Temp 37 ??C (98.6 ??F) (Temporal)  - Wt (!) 149.3 kg (329 lb 1.6 oz)  - BMI 42.25 kg/m??   Pain:  Pain Evaluation:                                0-10  Pain Score (0 - 10):                            [phone]::1)@  Pain Location:                                   Ear,Head  Last weight:    Wt Readings from Last 4 Encounters:   01/13/20 (!) 149.3 kg (329 lb 1.6 oz)   01/03/20 (!) 150.4 kg (331 lb 9.2 oz)   01/03/20 (!) 150.4 kg (331 lb 9.6 oz)   12/23/19 (!) 147.1 kg (324 lb 4.8 oz)     General:  Alert and Oriented X 3.  No acute distress.    MSK/Neuro: Normal gait     Felipa Emory ANP    ATTENDING ATTESTATION: I saw and evaluated/examined the patient,participating in the key portions of the service.  I discussed the findings, assessment, and plan of care with the nurse practitioner.  I agree with the findings and plan as documented in the nurse practitioner's note.    Bhishamjit S. Despina Hick, MD  Associate Professor  Associate Chair for Clinical Operations and Improvement  Director of Patient Safety and Quality  Department of Radiation Oncology  [employer] [facility]  [id number]  [place], [place] [address]  O: [phone]  01/13/20 1:44 PM

## 2020-01-17 ENCOUNTER — Encounter: Admit: 2020-01-17 | Discharge: 2020-02-14 | Payer: PRIVATE HEALTH INSURANCE

## 2020-01-17 ENCOUNTER — Encounter
Admit: 2020-01-17 | Discharge: 2020-02-14 | Payer: PRIVATE HEALTH INSURANCE | Attending: Radiation Oncology | Primary: Radiation Oncology

## 2020-01-17 ENCOUNTER — Ambulatory Visit: Admit: 2020-01-17 | Discharge: 2020-01-18

## 2020-01-17 DIAGNOSIS — C439 Malignant melanoma of skin, unspecified: Principal | ICD-10-CM

## 2020-01-17 DIAGNOSIS — C799 Secondary malignant neoplasm of unspecified site: Principal | ICD-10-CM

## 2020-01-17 DIAGNOSIS — C73 Malignant neoplasm of thyroid gland: Principal | ICD-10-CM

## 2020-01-17 DIAGNOSIS — C7931 Secondary malignant neoplasm of brain: Principal | ICD-10-CM

## 2020-01-17 DIAGNOSIS — Z510 Encounter for antineoplastic radiation therapy: Principal | ICD-10-CM

## 2020-01-18 ENCOUNTER — Ambulatory Visit
Admit: 2020-01-18 | Discharge: 2020-02-02 | Disposition: A | Discharge status: Home / Self Care | Payer: PRIVATE HEALTH INSURANCE

## 2020-01-18 ENCOUNTER — Encounter
Admit: 2020-01-18 | Discharge: 2020-02-02 | Disposition: A | Discharge status: Home / Self Care | Payer: PRIVATE HEALTH INSURANCE

## 2020-01-18 DIAGNOSIS — K56609 Unspecified intestinal obstruction, unspecified as to partial versus complete obstruction: Principal | ICD-10-CM

## 2020-01-18 DIAGNOSIS — E861 Hypovolemia: Principal | ICD-10-CM

## 2020-01-18 DIAGNOSIS — K5010 Crohn's disease of large intestine without complications: Principal | ICD-10-CM

## 2020-01-18 DIAGNOSIS — F330 Major depressive disorder, recurrent, mild: Principal | ICD-10-CM

## 2020-01-18 DIAGNOSIS — Z8042 Family history of malignant neoplasm of prostate: Principal | ICD-10-CM

## 2020-01-18 DIAGNOSIS — E890 Postprocedural hypothyroidism: Principal | ICD-10-CM

## 2020-01-18 DIAGNOSIS — C787 Secondary malignant neoplasm of liver and intrahepatic bile duct: Principal | ICD-10-CM

## 2020-01-18 DIAGNOSIS — C73 Malignant neoplasm of thyroid gland: Principal | ICD-10-CM

## 2020-01-18 DIAGNOSIS — E119 Type 2 diabetes mellitus without complications: Principal | ICD-10-CM

## 2020-01-18 DIAGNOSIS — C719 Malignant neoplasm of brain, unspecified: Principal | ICD-10-CM

## 2020-01-18 DIAGNOSIS — I10 Essential (primary) hypertension: Principal | ICD-10-CM

## 2020-01-18 DIAGNOSIS — Z800 Family history of malignant neoplasm of digestive organs: Principal | ICD-10-CM

## 2020-01-18 DIAGNOSIS — K430 Incisional hernia with obstruction, without gangrene: Principal | ICD-10-CM

## 2020-01-18 DIAGNOSIS — Z9049 Acquired absence of other specified parts of digestive tract: Principal | ICD-10-CM

## 2020-01-18 DIAGNOSIS — E440 Moderate protein-calorie malnutrition: Principal | ICD-10-CM

## 2020-01-18 DIAGNOSIS — R188 Other ascites: Principal | ICD-10-CM

## 2020-01-18 DIAGNOSIS — C439 Malignant melanoma of skin, unspecified: Principal | ICD-10-CM

## 2020-01-18 DIAGNOSIS — N179 Acute kidney failure, unspecified: Principal | ICD-10-CM

## 2020-01-18 DIAGNOSIS — C786 Secondary malignant neoplasm of retroperitoneum and peritoneum: Principal | ICD-10-CM

## 2020-01-18 DIAGNOSIS — Z20822 Contact with and (suspected) exposure to covid-19: Principal | ICD-10-CM

## 2020-01-18 DIAGNOSIS — M109 Gout, unspecified: Principal | ICD-10-CM

## 2020-01-18 DIAGNOSIS — Z923 Personal history of irradiation: Principal | ICD-10-CM

## 2020-01-18 DIAGNOSIS — Z6841 Body Mass Index (BMI) 40.0 and over, adult: Principal | ICD-10-CM

## 2020-01-18 LAB — COMPREHENSIVE METABOLIC PANEL
ALBUMIN: 4.8 g/dL (ref 3.4–5.0)
ALKALINE PHOSPHATASE: 82 U/L (ref 46–116)
ALT (SGPT): 39 U/L (ref 10–49)
ANION GAP: 16 mmol/L — ABNORMAL HIGH (ref 5–14)
AST (SGOT): 50 U/L — ABNORMAL HIGH (ref <=34)
BILIRUBIN TOTAL: 0.6 mg/dL (ref 0.3–1.2)
BLOOD UREA NITROGEN: 21 mg/dL (ref 9–23)
BUN / CREAT RATIO: 16
CALCIUM: 8.4 mg/dL — ABNORMAL LOW (ref 8.7–10.4)
CHLORIDE: 102 mmol/L (ref 98–107)
CO2: 20 mmol/L (ref 20.0–31.0)
CREATININE: 1.34 mg/dL — ABNORMAL HIGH
EGFR CKD-EPI AA MALE: 74 mL/min/{1.73_m2} (ref >=60)
EGFR CKD-EPI NON-AA MALE: 64 mL/min/{1.73_m2} (ref >=60)
GLUCOSE RANDOM: 166 mg/dL (ref 70–179)
POTASSIUM: 4.2 mmol/L (ref 3.4–4.5)
PROTEIN TOTAL: 8.5 g/dL — ABNORMAL HIGH (ref 5.7–8.2)
SODIUM: 138 mmol/L (ref 135–145)

## 2020-01-18 LAB — CBC W/ AUTO DIFF
BASOPHILS ABSOLUTE COUNT: 0.1 10*9/L (ref 0.0–0.1)
BASOPHILS RELATIVE PERCENT: 0.4 %
EOSINOPHILS ABSOLUTE COUNT: 0.3 10*9/L (ref 0.0–0.4)
EOSINOPHILS RELATIVE PERCENT: 1.8 %
HEMATOCRIT: 49.1 % (ref 41.0–53.0)
HEMOGLOBIN: 15.7 g/dL (ref 13.5–17.5)
LARGE UNSTAINED CELLS: 1 % (ref 0–4)
LYMPHOCYTES ABSOLUTE COUNT: 1.6 10*9/L (ref 1.5–5.0)
LYMPHOCYTES RELATIVE PERCENT: 8.9 %
MEAN CORPUSCULAR HEMOGLOBIN CONC: 31.9 g/dL (ref 31.0–37.0)
MEAN CORPUSCULAR HEMOGLOBIN: 25.1 pg — ABNORMAL LOW (ref 26.0–34.0)
MEAN CORPUSCULAR VOLUME: 78.8 fL — ABNORMAL LOW (ref 80.0–100.0)
MEAN PLATELET VOLUME: 10 fL (ref 7.0–10.0)
MONOCYTES ABSOLUTE COUNT: 0.8 10*9/L (ref 0.2–0.8)
MONOCYTES RELATIVE PERCENT: 4.7 %
NEUTROPHILS ABSOLUTE COUNT: 14.6 10*9/L — ABNORMAL HIGH (ref 2.0–7.5)
NEUTROPHILS RELATIVE PERCENT: 83.5 %
PLATELET COUNT: 497 10*9/L — ABNORMAL HIGH (ref 150–440)
RED BLOOD CELL COUNT: 6.23 10*12/L — ABNORMAL HIGH (ref 4.50–5.90)
RED CELL DISTRIBUTION WIDTH: 17.3 % — ABNORMAL HIGH (ref 12.0–15.0)
WBC ADJUSTED: 17.5 10*9/L — ABNORMAL HIGH (ref 4.5–11.0)

## 2020-01-18 LAB — BLOOD GAS CRITICAL CARE PANEL, VENOUS
BASE EXCESS VENOUS: -2.5 — ABNORMAL LOW (ref -2.0–2.0)
CALCIUM IONIZED VENOUS (MG/DL): 3.64 mg/dL — ABNORMAL LOW (ref 4.40–5.40)
GLUCOSE WHOLE BLOOD: 167 mg/dL (ref 70–179)
HCO3 VENOUS: 21 mmol/L — ABNORMAL LOW (ref 22–27)
HEMOGLOBIN BLOOD GAS: 16.2 g/dL (ref 13.50–17.50)
LACTATE BLOOD VENOUS: 5.7 mmol/L (ref 0.5–1.8)
O2 SATURATION VENOUS: 65.2 % (ref 40.0–85.0)
PCO2 VENOUS: 32 mmHg — ABNORMAL LOW (ref 40–60)
PH VENOUS: 7.43 (ref 7.32–7.43)
PO2 VENOUS: 39 mmHg (ref 30–55)
POTASSIUM WHOLE BLOOD: 4.2 mmol/L (ref 3.4–4.6)
SODIUM WHOLE BLOOD: 139 mmol/L (ref 135–145)

## 2020-01-18 LAB — IONIZED CALCIUM VENOUS: CALCIUM IONIZED VENOUS (MG/DL): 3.64 mg/dL — ABNORMAL LOW (ref 4.40–5.40)

## 2020-01-18 LAB — LIPASE: LIPASE: 46 U/L (ref 12–53)

## 2020-01-18 LAB — MAGNESIUM: MAGNESIUM: 2.1 mg/dL (ref 1.6–2.6)

## 2020-01-18 NOTE — Unmapped (Signed)
Kenneth Fitzgerald sent me a MyChart and then we spoke. He has had vomiting without relation to eating as well as abd pain. He had a BM yesterday. Afebrile. He is taking dexamethasone 4mg  BID and had not taken any zofran today.   As I tried to reach Kenneth Fitzgerald, I advised him to take the ODT zofran. He has not taken any of his meds today so I also asked him to take his levothyroxine.     I spoke at length with Kenneth Fitzgerald and we agree he should come to our [facility] ED for scans and lab and possible IVF. Kenneth Fitzgerald agreed. He is not vaccinated so I advised him to double mask.

## 2020-01-18 NOTE — Unmapped (Signed)
[facility] Refill Coordination Note    Specialty Medication(s) to be Shipped:   Hematology/Oncology: [name] and [name]    Other medication(s) to be shipped: Sulfasalazine 500mg      Kenneth Fitzgerald, DOB: February 11, 1975  Phone: [phone] (home)       All above HIPAA information was verified with patient.     Was a Nurse, learning disability used for this call? No    Completed refill call assessment today to schedule patient's medication shipment from the [facility] [phone]).       Specialty medication(s) and dose(s) confirmed: Regimen is correct and unchanged.   Changes to medications: Kenneth Fitzgerald reports no changes at this time.  Changes to insurance: No  Questions for the pharmacist: No    Confirmed patient received Welcome Packet with first shipment. The patient will receive a drug information handout for each medication shipped and additional FDA Medication Guides as required.       DISEASE/MEDICATION-SPECIFIC INFORMATION        N/A    SPECIALTY MEDICATION ADHERENCE     Medication Adherence    Patient reported X missed doses in the last month: 0  Specialty Medication: Braftovi 75mg   Patient is on additional specialty medications: Yes  Additional Specialty Medications: [name] 15mg   Patient Reported Additional Medication X Missed Doses in the Last Month: 0  Patient is on more than two specialty medications: No  Informant: patient                Braftovi 75 mg: 15 days of medicine on hand   [name] 15 mg: 15 days of medicine on hand         SHIPPING     Shipping address confirmed in Epic.     Delivery Scheduled: Yes, Expected medication delivery date: 01/27/20.     Medication will be delivered via UPS to the prescription address in Epic [place].    [name] M [name]   [facility] Specialty Technician

## 2020-01-18 NOTE — Unmapped (Signed)
I spoke with patient Kenneth Fitzgerald [facility] to confirm appointments on the following date 04/19/20  [name]

## 2020-01-18 NOTE — Unmapped (Signed)
Pt reports currently undergoing radiation for stage IV melanoma and has recurrent headaches and nausea ongoing over last week and last night started having abd pain and cramps. Pt denies known fever

## 2020-01-19 DIAGNOSIS — E119 Type 2 diabetes mellitus without complications: Principal | ICD-10-CM

## 2020-01-19 DIAGNOSIS — Z20822 Contact with and (suspected) exposure to covid-19: Principal | ICD-10-CM

## 2020-01-19 DIAGNOSIS — K56609 Unspecified intestinal obstruction, unspecified as to partial versus complete obstruction: Principal | ICD-10-CM

## 2020-01-19 DIAGNOSIS — Z9049 Acquired absence of other specified parts of digestive tract: Principal | ICD-10-CM

## 2020-01-19 DIAGNOSIS — Z800 Family history of malignant neoplasm of digestive organs: Principal | ICD-10-CM

## 2020-01-19 DIAGNOSIS — R188 Other ascites: Principal | ICD-10-CM

## 2020-01-19 DIAGNOSIS — Z923 Personal history of irradiation: Principal | ICD-10-CM

## 2020-01-19 DIAGNOSIS — Z6841 Body Mass Index (BMI) 40.0 and over, adult: Principal | ICD-10-CM

## 2020-01-19 DIAGNOSIS — K430 Incisional hernia with obstruction, without gangrene: Principal | ICD-10-CM

## 2020-01-19 DIAGNOSIS — C787 Secondary malignant neoplasm of liver and intrahepatic bile duct: Principal | ICD-10-CM

## 2020-01-19 DIAGNOSIS — K5010 Crohn's disease of large intestine without complications: Principal | ICD-10-CM

## 2020-01-19 DIAGNOSIS — C786 Secondary malignant neoplasm of retroperitoneum and peritoneum: Principal | ICD-10-CM

## 2020-01-19 DIAGNOSIS — N179 Acute kidney failure, unspecified: Principal | ICD-10-CM

## 2020-01-19 DIAGNOSIS — E890 Postprocedural hypothyroidism: Principal | ICD-10-CM

## 2020-01-19 DIAGNOSIS — I10 Essential (primary) hypertension: Principal | ICD-10-CM

## 2020-01-19 DIAGNOSIS — C439 Malignant melanoma of skin, unspecified: Principal | ICD-10-CM

## 2020-01-19 DIAGNOSIS — Z8042 Family history of malignant neoplasm of prostate: Principal | ICD-10-CM

## 2020-01-19 DIAGNOSIS — F330 Major depressive disorder, recurrent, mild: Principal | ICD-10-CM

## 2020-01-19 DIAGNOSIS — C719 Malignant neoplasm of brain, unspecified: Principal | ICD-10-CM

## 2020-01-19 DIAGNOSIS — C73 Malignant neoplasm of thyroid gland: Principal | ICD-10-CM

## 2020-01-19 DIAGNOSIS — E861 Hypovolemia: Principal | ICD-10-CM

## 2020-01-19 DIAGNOSIS — M109 Gout, unspecified: Principal | ICD-10-CM

## 2020-01-19 DIAGNOSIS — E440 Moderate protein-calorie malnutrition: Principal | ICD-10-CM

## 2020-01-19 LAB — URINALYSIS WITH CULTURE REFLEX
BACTERIA: NONE SEEN /HPF
BILIRUBIN UA: NEGATIVE
BLOOD UA: NEGATIVE
GLUCOSE UA: NEGATIVE
HYALINE CASTS: 6 /LPF — ABNORMAL HIGH (ref 0–1)
KETONES UA: NEGATIVE
LEUKOCYTE ESTERASE UA: NEGATIVE
NITRITE UA: NEGATIVE
PH UA: 5 (ref 5.0–9.0)
PROTEIN UA: NEGATIVE
RBC UA: 3 /HPF (ref <=3)
SPECIFIC GRAVITY UA: 1.058 — ABNORMAL HIGH (ref 1.003–1.030)
SQUAMOUS EPITHELIAL: <1 /HPF (ref 0–5)
UROBILINOGEN UA: 0.2
WBC UA: 3 /HPF — ABNORMAL HIGH (ref <=2)

## 2020-01-19 LAB — BASIC METABOLIC PANEL
ANION GAP: 13 mmol/L (ref 5–14)
BLOOD UREA NITROGEN: 21 mg/dL (ref 9–23)
BUN / CREAT RATIO: 19
CALCIUM: 7.5 mg/dL — ABNORMAL LOW (ref 8.7–10.4)
CHLORIDE: 101 mmol/L (ref 98–107)
CO2: 23 mmol/L (ref 20.0–31.0)
CREATININE: 1.1 mg/dL
EGFR CKD-EPI AA MALE: >90 mL/min/{1.73_m2} (ref >=60)
EGFR CKD-EPI NON-AA MALE: 81 mL/min/{1.73_m2} (ref >=60)
GLUCOSE RANDOM: 119 mg/dL (ref 70–179)
POTASSIUM: 4.1 mmol/L (ref 3.4–4.5)
SODIUM: 137 mmol/L (ref 135–145)

## 2020-01-19 LAB — PHOSPHORUS: PHOSPHORUS: 7.4 mg/dL — ABNORMAL HIGH (ref 2.4–5.1)

## 2020-01-19 LAB — CBC
HEMATOCRIT: 44.5 % (ref 41.0–53.0)
HEMOGLOBIN: 14.3 g/dL (ref 13.5–17.5)
MEAN CORPUSCULAR HEMOGLOBIN CONC: 32.2 g/dL (ref 31.0–37.0)
MEAN CORPUSCULAR HEMOGLOBIN: 25.2 pg — ABNORMAL LOW (ref 26.0–34.0)
MEAN CORPUSCULAR VOLUME: 78.2 fL — ABNORMAL LOW (ref 80.0–100.0)
MEAN PLATELET VOLUME: 8.3 fL (ref 7.0–10.0)
PLATELET COUNT: 392 10*9/L (ref 150–440)
RED BLOOD CELL COUNT: 5.69 10*12/L (ref 4.50–5.90)
RED CELL DISTRIBUTION WIDTH: 17.3 % — ABNORMAL HIGH (ref 12.0–15.0)
WBC ADJUSTED: 15.6 10*9/L — ABNORMAL HIGH (ref 4.5–11.0)

## 2020-01-19 LAB — MAGNESIUM: MAGNESIUM: 2.3 mg/dL (ref 1.6–2.6)

## 2020-01-19 LAB — LACTATE, VENOUS, WHOLE BLOOD: LACTATE BLOOD VENOUS: 2.2 mmol/L — ABNORMAL HIGH (ref 0.5–1.8)

## 2020-01-19 MED ADMIN — magnesium sulfate in water 2 gram/50 mL (4 %) IVPB 2 g: 2 g | INTRAVENOUS | @ 02:00:00 | Stop: 2020-01-18

## 2020-01-19 MED ADMIN — lactated ringers bolus 2,000 mL: 2000 mL | INTRAVENOUS | @ 02:00:00 | Stop: 2020-01-18

## 2020-01-19 MED ADMIN — fentaNYL (PF) (SUBLIMAZE) injection 50 mcg: 50 ug | INTRAVENOUS | @ 09:00:00 | Stop: 2020-01-19

## 2020-01-19 MED ADMIN — lactated Ringers infusion: 100 mL/h | INTRAVENOUS | @ 21:00:00 | Stop: 2020-01-21

## 2020-01-19 MED ADMIN — promethazine (PHENERGAN) 25 mg in sodium chloride (NS) 0.9 % 25 mL infusion: 25 mg | INTRAVENOUS | @ 01:00:00 | Stop: 2020-01-18

## 2020-01-19 MED ADMIN — lactated ringers bolus 1,000 mL: 1000 mL | INTRAVENOUS | @ 01:00:00 | Stop: 2020-01-18

## 2020-01-19 MED ADMIN — iohexol (OMNIPAQUE) 240 mg iodine/mL oral solution 50 mL: 50 mL | ORAL | @ 02:00:00 | Stop: 2020-01-18

## 2020-01-19 MED ADMIN — lidocaine (XYLOCAINE) 2% viscous mucosal solution: 10 mL | OROMUCOSAL | @ 13:00:00 | Stop: 2020-01-19

## 2020-01-19 MED ADMIN — dexamethasone (DECADRON) 4 mg/mL injection 4 mg: 4 mg | INTRAVENOUS | @ 21:00:00 | Stop: 2020-01-20

## 2020-01-19 MED ADMIN — calcium gluconate 1 g in sodium chloride (NS) 0.9 % 100 mL IVPB: 1 g | INTRAVENOUS | @ 04:00:00 | Stop: 2020-01-18

## 2020-01-19 MED ADMIN — promethazine (PHENERGAN) 25 mg in sodium chloride (NS) 0.9 % 25 mL infusion: 25 mg | INTRAVENOUS | @ 03:00:00 | Stop: 2020-01-18

## 2020-01-19 MED ADMIN — promethazine (PHENERGAN) 12.5 mg in sodium chloride (NS) 0.9 % 25 mL infusion: 12.5 mg | INTRAVENOUS | @ 13:00:00 | Stop: 2020-01-19

## 2020-01-19 MED ADMIN — promethazine (PHENERGAN) 12.5 mg in sodium chloride (NS) 0.9 % 25 mL infusion: 12.5 mg | INTRAVENOUS | @ 06:00:00 | Stop: 2020-01-19

## 2020-01-19 MED ADMIN — MORPhine injection 1 mg: 1 mg | INTRAVENOUS | @ 14:00:00 | Stop: 2020-01-20

## 2020-01-19 MED ADMIN — lidocaine (PF) (XYLOCAINE-MPF) 10 mg/mL (1 %) nebulization 40 mg: 40 mg | RESPIRATORY_TRACT | @ 13:00:00 | Stop: 2020-01-19

## 2020-01-19 MED ADMIN — iohexoL (OMNIPAQUE) 350 mg iodine/mL solution 100 mL: 100 mL | INTRAVENOUS | @ 06:00:00 | Stop: 2020-01-19

## 2020-01-19 NOTE — Unmapped (Signed)
Received sign out from previous provider.     ED I-PASS Handoff  ?? Illness Severit: watcher  ?? Patient Summary: Kenneth Fitzgerald is a 45 y.o. male with a PMH of recurrent thyroid cancer (s/p RT to the neck in 2015), stage IV melanoma, diverticulitis, ileus, and atypical Crohn's disease (s/p bowel resection) who presents to the ED for evaluation of emesis. The patient reports 1.5 weeks of indigestion and worsening nausea with associated abdominal cramping. The cramping became severe last night and then this morning he began vomiting more than usual. He states that the abdominal soreness worsens with dry heaving. He has not had any bowel movements since yesterday and hasn't passed any gas since last night. The patient reports that today he was supposed to have his 10th and final day of brain radiation. A week ago he began to experience a headache and nausea and was prescribed 4 mg of dexamethasone BID and 8mg  of Zofran TID by his oncologist. He has not had any chemo on this round of cancer treatment. The patient denies any fevers.    ?? Action List:   ?? F/u scans  ?? Situation Awareness (Contingency Planning): Anticipate admission.  ?? Synthesis by Receiver    Assessment & Plan:    ED Course as of 01/19/20 0740   Wed Jan 19, 2020   1610 CT with evidence of closed-loop obstruction and ventral hernia.  General surgery down to evaluate and suspect likely need for admission for either medical management or operative intervention.  Bedside attempt reduction of hernia will be attempted   0647 General surgery will plan for admission we will attempted NG tube placement however patient has had significantly complicated NG tube placements in the past requiring conscious sedation reportedly.  Will attempt nebulized lidocaine as well as viscous lidocaine and bupivacaine to assist with NG tube placement.

## 2020-01-19 NOTE — Unmapped (Signed)
Triage EKG timed 1915 reviewed. There is no evidence of STEMI or immediate life threat. The triage nurse will triage this patient according to standard protocol and place standing orders as approprate. Patient to come back as soon as a bed is available.

## 2020-01-19 NOTE — Unmapped (Signed)
General Surgery H&P    Date of Service: 01/19/2020    Requesting Attending: Dr. Maggie Schwalbe  Requesting Service: Emergency Medicine     Consulting Attending: Dr. Ruben Im  Consulting Service: General Surgery    Reason for consultation:   We are seeing Kenneth Fitzgerald for evaluation of SBO in setting of ventral hernia at the request of Dr. Irven [place] of Emergency Medicine    ASSESSMENT/PLAN:  Kenneth Fitzgerald is a 45 y.o. 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  Patient with multiple days of persistent nausea vomiting, with decreased flatus and worsening bouts of emesis over the last 24 hours.  Patient's abdominal exam reassuring, does have palpable but reducible ventral hernia with no overlying skin change. Suspect patient's symptoms likely combination of radiation therapy and obstructive symptoms 2/2 to ventral hernia.  Patient with a leukocytosis of 17K (pt on steroids so may be reactive), hemoglobin and hematocrit are elevated along with thrombocytosis, and elevated creatinine suggesting patient is likely volume down from his persistent nausea and vomiting.  Given his findings concerning for closed-loop obstruction, will trial nonoperative management with placement of NG tube decompression, IV fluid resuscitation, and serial abdominal exams with consult for patient's primary oncology team in a.m.    -Admit to [facility], floor status  -NG tube to LIWS  -NPO, MIVF  -Serial abdominal exams  -Repeat labs, CBC, CMP, mag, Phos, and will obtain arterial lactic acid  -Consult patient's oncology team this a.m. for additional recommendations, comanagement of patient's multiple medical comorbidities     This consult was discussed with Dr. Ruben Im, the attending physician. Please contact the [facility] Floor Pager with any questions or concerns.     ATTESTATION:    I saw and examined the patient after the resident and I agree with the evaluation and plan in the resident note. Kenneth Fitzgerald     HPI:  Kenneth Fitzgerald is a 45 y.o. 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  Patient has had multiple prior abdominal surgeries including laparoscopic LAR and splenic flexure mobilization in 06/26/2010, laparoscopic ventral hernia repair with mesh placement in 2016, open retrorectus ventral hernia repair with mesh in 2016, open primary ventral hernia repair with mesh explantation in 2018.  Patient presents to the ED today with a 2-day history of persistent nausea, vomiting patient currently undergoing brain radiation for metastatic melanoma, has reportedly been having persistent nausea since this has begun, approximately 1.5 weeks, however with worsening vomiting patient presented to the ED for further evaluation.  On chart review patient is currently on 4 mg prednisone twice daily and as needed Zofran for nausea.  Patient has had multiple large volume episodes of emesis since arrival to the emergency department, with complaints of fatigue, and body aches.  Patient additionally reports that he is no longer passing flatus, last bowel movement reported yesterday which she states was normal in size and consistency for him.    REVIEW OF SYSTEMS:  A further review of ten systems was performed and negative except as stated in HPI.    MEDICAL HISTORY:  Past Medical History:   Diagnosis Date  Cancer (CMS-HCC)     melanoma, thyroid cancer    Disease of thyroid gland     hypothyroid    Hypothyroidism     Skin cancer      SURGICAL HISTORY:  Past Surgical History:   Procedure Laterality Date    PR COLONOSCOPY W/BIOPSY SINGLE/MULTIPLE N/A 01/09/2018    Procedure: COLONOSCOPY, FLEXIBLE, PROXIMAL TO SPLENIC FLEXURE; WITH BIOPSY, SINGLE OR MULTIPLE;  Surgeon: Vonda Antigua, MD;  [facility];  Service: Gastroenterology    PR COLONOSCOPY W/BIOPSY SINGLE/MULTIPLE N/A 08/24/2019    Procedure: COLONOSCOPY, FLEXIBLE, PROXIMAL TO SPLENIC FLEXURE; WITH BIOPSY, SINGLE OR MULTIPLE;  Surgeon: Leland Her, MD;  Location: [facility] PROC[facility] [facility];  Service: Gastroenterology    PR COLSC FLX WITH DIRECTED SUBMUCOSAL NJX ANY SBST N/A 08/24/2019    Procedure: COLONOSCOPY, FLEXIBLE, PROXIMAL TO SPLENIC FLEXURE; WITH DIRECTED SUBMUCOSAL INJECTION(S), ANY SUBSTANCE;  Surgeon: Leland Her, MD;  Location: [facility];  Service: Gastroenterology    PR IMPLANT MESH HERNIA REPAIR/DEBRIDEMENT CLOSURE N/A 01/03/2015    Procedure: IMPLANTATION OF MESH/OTHER PROSTHES INCISION/VENTRAL HERNIA REPAIR/MESH CLOSE DEBRID NECROT SOFT TIS INFECT;  Surgeon: Romero Belling, MD;  Location: MAIN OR [place];  Service: Gastrointestinal    PR LAP, VENTRAL HERNIA REPAIR,REDUCIBLE N/A 04/07/2014    Procedure: LAPAROSCOPY, SURGICAL, REPAIR, VENTRAL, UMBILICAL, SPIGELIAN OR EPIGASTRIC HERNIA, REDUCIBLE;  Surgeon: Romero Belling, MD;  Location: MAIN OR [place];  Service: Gastrointestinal    PR REMOVAL NODES, NECK,CERV CMPLT Left 12/07/2012    Procedure: CERVICAL LYMPHADENECTOMY (COMPLETE);  Surgeon: Charlott Rakes, MD;  Location: MAIN OR [facility];  Service: Surgical Oncology    PR REMOVAL NODES, NECK,CERV MOD RAD Right 03/29/2013    Procedure: CERVICAL LYMPHADENECTOMY (MODIFIED RADICAL NECK DISSECTION);  Surgeon: Charlott Rakes, MD;  Location: MAIN OR [facility];  Service: Surgical Oncology    PR REPAIR RECURR INCIS [facility] N/A 01/03/2015    Procedure: REPAIR RECURRENT INCISIONAL OR VENTRAL HERNIA; REDUCIBLE;  Surgeon: Romero Belling, MD;  Location: MAIN OR [facility];  Service: Gastrointestinal    PR REPAIR RECURR INCIS HERNIA,STRANG N/A 06/21/2016    Procedure: REPAIR RECURRENT INCISIONAL OR VENTRAL HERNIA; INCARCERATED OR STRANGULATED;  Surgeon: Mickle Asper, MD;  Location: MAIN OR [place];  Service: Gastrointestinal    PR SIGMOIDOSCOPY,FINE NEEDL BX,US GUIDED N/A 01/09/2018    Procedure: SIGMOIDOSCOPY, FLEXIBLE, W/TRANSENDOSCOPIC ULTRASOUND GUIDED NEEDLE ASPIRATION;  Surgeon: Vonda Antigua, MD;  Location: GI [facility];  Service: Gastroenterology     ALLERGIES:  Allergies as of 01/18/2020 - Reviewed 01/18/2020   Allergen Reaction Noted    Compazine [prochlorperazine] Itching 09/21/2011    Coconut Nausea And Vomiting 05/14/2012    Multihance [gadobenate dimeglumine] Other (See Comments) 01/28/2019     MEDICATIONS:  Prior to Admission medications    Medication Sig Start Date End Date Taking? Authorizing Provider   binimetinib ([name]) 15 mg tablet Take 3 tablets (45 mg total) by mouth Two (2) times a day. 10/06/19   [name], MD   blood sugar diagnostic Strp Dispense 100 blood glucose test strips, ok to sub any brand preferred by insurance/patient, use 3x/day; dispense whatever brand matches with meter. 06/22/18   Tripuraneni Jackquline Denmark, MD   blood-glucose meter kit Use as instructed; dispense 1 meter, whatever is preferred by insurance 06/22/18   Tripuraneni Jackquline Denmark, MD   calcitrioL (ROCALTROL) 0.25 MCG capsule Take 1 capsule (0.25 mcg total) by mouth daily. 08/30/19   Tripuraneni Jackquline Denmark, MD   calcium carbonate  650 mg calcium (1,625 mg) tablet Take 1 tablet by mouth Three (3) times a day with a meal.    Historical Provider, MD   dexAMETHasone (DECADRON) 4 MG tablet 2 Tab PO BID 01/06/20   Richardean Canal, MD   DULoxetine (CYMBALTA) 30 MG capsule Take 1 capsule (30 mg total) by mouth Two (2) times a day. 08/27/19   Gillis Santa, MD   encorafenib (BRAFTOVI) 75 mg capsule Take 6 capsules (450 mg total) by mouth daily. 10/06/19   [name], MD   fexofenadine [facility] ALLERGY) 180 MG tablet Take 180 mg by mouth daily as needed.  08/20/10   Historical Provider, MD   folic acid (FOLVITE) 1 MG tablet Take 1 tablet (1 mg total) by mouth daily. 09/30/19   Luanne Bras, MD   ibuprofen (ADVIL,MOTRIN) 200 MG tablet Take 600 mg by mouth daily as needed for pain.    Historical Provider, MD   lancets Misc Dispense 100 lancets, ok to sub any brand preferred by insurance/patient, use 3x/day 06/22/18   Tripuraneni Jackquline Denmark, MD   levothyroxine (SYNTHROID) 125 MCG tablet Take LT4 250 mcg x 2 days/week (Sat-Sun), and 375 mcg x 5 days/week (Mon-Fri) 01/04/20   Tripuraneni Jackquline Denmark, MD   melatonin 3 mg Tab Take 1 tablet (3 mg total) by mouth every evening. 08/09/19   Judie Grieve, MD   memantine (NAMENDA) 5 MG tablet Take 5mg  daily for 1 week, 5mg  twice daily for week 2, 10mg  in the morning and 5mg  in the evening for week 3, 10mg  twice daily for week 4. Continue 10mg  twice daily for a total of 6 months. 12/22/19   Richardean Canal, MD   metFORMIN (GLUCOPHAGE) 500 MG tablet Take 1 tablet (500 mg total) by mouth 2 (two) times a day with meals. 08/30/19 08/29/20  Tripuraneni Jackquline Denmark, MD   ondansetron (ZOFRAN) 8 MG tablet Take 1 tablet (8 mg total) by mouth every eight (8) hours as needed for nausea. 01/06/20 02/05/20  Richardean Canal, MD   sulfaSALAzine (AZULFIDINE) 500 mg tablet Take 2 tablets (1000mg ) by mouth twice daily for 7 days, then take 4 tablets (2000mg ) twice daily thereafter. 10/11/19   Luanne Bras, MD     SOCIAL HISTORY   reports that he has never smoked. He has never used smokeless tobacco. He reports current alcohol use. He reports that he does not use drugs.  Social History     Tobacco Use   Smoking Status Never Smoker   Smokeless Tobacco Never Used     FAMILY HISTORY:  Family History   Problem Relation Age of Onset    Hyperthyroidism Mother     Osteoporosis Mother     Arrhythmia Mother     Squamous cell carcinoma Mother         basal cell vs squamous cell skin cancer    Coronary artery disease Father         s/p CABG    Diabetes Father     Hypertension Father     Prostate cancer Father     Colon cancer Paternal Grandmother     Thyroid disease Neg Hx      PHYSICAL EXAMINATION:   VITALS: BP 105/75  - Pulse 104  - Temp 36.8 ??C (Oral)  - Resp 19  - SpO2 94%     No intake/output data recorded.     GENERAL:  NAD  CARDIOVASCULAR: Sinus tachycardia  RESPIRATORY: Normal work of  breathing on RA, Clear to auscultation bilaterally, adequate inspiratory/expiratory effort  ABDOMEN: Soft, obese abdomen, well healed midline vertical scar, palpable ventral hernia without overlying skin changes, able to palpate gas movement and hernia easily reducible although does spontaneously recur. Overall non-tender throughout without focal or diffuse peritonitis  EXTREMITIES: Warm, well-perfused  SKIN: No lesions, ulcerations, breakdowns noted on exam  NEURO: alert, oriented x 3, no defects noted in general exam.  LYMPHATICS: No cervical, supraclavicular, infraclavicular, axillary, or inguinal lymphadenopathy.    Ancillary Data:  All lab results last 24 hours:    Recent Results (from the past 24 hour(s))   ECG 12 Lead    Collection Time: 01/18/20  6:30 PM   Result Value Ref Range    EKG Systolic BP  mmHg    EKG Diastolic BP  mmHg    EKG Ventricular Rate 114 BPM    EKG Atrial Rate 115 BPM    EKG P-R Interval 132 ms    EKG QRS Duration 144 ms    EKG Q-T Interval 378 ms    EKG QTC Calculation 521 ms    EKG Calculated P Axis 53 degrees    EKG Calculated R Axis -4 degrees    EKG Calculated T Axis 17 degrees    QTC Fredericia 468 ms   Comprehensive Metabolic Panel    Collection Time: 01/18/20  7:49 PM   Result Value Ref Range    Sodium 138 135 - 145 mmol/L    Potassium 4.2 3.4 - 4.5 mmol/L    Chloride 102 98 - 107 mmol/L    Anion Gap 16 (H) 5 - 14 mmol/L    CO2 20.0 20.0 - 31.0 mmol/L    BUN 21 9 - 23 mg/dL    Creatinine 1.61 (H) 0.60 - 1.10 mg/dL    BUN/Creatinine Ratio 16     EGFR CKD-EPI Non-African American, Male 74 >=60 mL/min/1.62m2    EGFR CKD-EPI African American, Male 24 >=60 mL/min/1.92m2    Glucose 166 70 - 179 mg/dL    Calcium 8.4 (L) 8.7 - 10.4 mg/dL    Albumin 4.8 3.4 - 5.0 g/dL    Total Protein 8.5 (H) 5.7 - 8.2 g/dL    Total Bilirubin 0.6 0.3 - 1.2 mg/dL    AST 50 (H) <=09 U/L    ALT 39 10 - 49 U/L    Alkaline Phosphatase 82 46 - 116 U/L   Lipase Level    Collection Time: 01/18/20  7:49 PM   Result Value Ref Range    Lipase 46 12 - 53 U/L   CBC w/ Differential    Collection Time: 01/18/20  7:49 PM   Result Value Ref Range    WBC 17.5 (H) 4.5 - 11.0 10*9/L    RBC 6.23 (H) 4.50 - 5.90 10*12/L    HGB 15.7 13.5 - 17.5 g/dL    HCT 60.4 54.0 - 98.1 %    MCV 78.8 (L) 80.0 - 100.0 fL    MCH 25.1 (L) 26.0 - 34.0 pg    MCHC 31.9 31.0 - 37.0 g/dL    RDW 19.1 (H) 47.8 - 15.0 %    MPV 10.0 7.0 - 10.0 fL    Platelet 497 (H) 150 - 440 10*9/L    Neutrophils % 83.5 %    Lymphocytes % 8.9 %    Monocytes % 4.7 %    Eosinophils % 1.8 %    Basophils % 0.4 %    Neutrophil Left Shift 1+ (  A) Not Present    Absolute Neutrophils 14.6 (H) 2.0 - 7.5 10*9/L    Absolute Lymphocytes 1.6 1.5 - 5.0 10*9/L    Absolute Monocytes 0.8 0.2 - 0.8 10*9/L    Absolute Eosinophils 0.3 0.0 - 0.4 10*9/L    Absolute Basophils 0.1 0.0 - 0.1 10*9/L    Large Unstained Cells 1 0 - 4 %    Microcytosis Moderate (A) Not Present    Anisocytosis Slight (A) Not Present    Hypochromasia Slight (A) Not Present   Blood Gas Critical Care Panel, Venous    Collection Time: 01/18/20  7:49 PM   Result Value Ref Range    Specimen Source Venous     FIO2 Venous Room Air     pH, Venous 7.43 7.32 - 7.43    pCO2, Ven 32 (L) 40 - 60 mm Hg    pO2, Ven 39 30 - 55 mm Hg    HCO3, Ven 21 (L) 22 - 27 mmol/L    Base Excess, Ven -2.5 (L) -2.0 - 2.0    O2 Saturation, Venous 65.2 40.0 - 85.0 %    Sodium Whole Blood 139 135 - 145 mmol/L    Potassium, Bld 4.2 3.4 - 4.6 mmol/L    Calcium, Ionized Venous 3.64 (L) 4.40 - 5.40 mg/dL    Glucose Whole Blood 167 70 - 179 mg/dL    Lactate, Venous 5.7 (HH) 0.5 - 1.8 mmol/L    Hgb, blood gas 16.20 13.50 - 17.50 g/dL   Ionized Calcium, Venous    Collection Time: 01/18/20  7:49 PM   Result Value Ref Range    Calcium, Ionized Venous 3.64 (L) 4.40 - 5.40 mg/dL   Magnesium Level    Collection Time: 01/18/20  7:49 PM   Result Value Ref Range    Magnesium 2.1 1.6 - 2.6 mg/dL   RAPID INFLUENZA/RSV/COVID PCR    Collection Time: 01/18/20  8:12 PM    Specimen: Nasopharyngeal Swab   Result Value Ref Range    Influenza A Negative Negative    Influenza B Negative Negative    RSV Negative Negative    SARS-CoV-2 PCR Negative Negative   Urinalysis with Culture Reflex    Collection Time: 01/19/20  4:10 AM    Specimen: Clean Catch; Urine   Result Value Ref Range    Color, UA Yellow     Clarity, UA Hazy     Specific Gravity, UA 1.058 (H) 1.003 - 1.030    pH, UA 5.0 5.0 - 9.0    Leukocyte Esterase, UA Negative Negative    Nitrite, UA Negative Negative    Protein, UA Negative Negative    Glucose, UA Negative Negative    Ketones, UA Negative Negative    Urobilinogen, UA 0.2 mg/dL 0.2 mg/dL, 1.0 mg/dL    Bilirubin, UA Negative Negative    Blood, UA Negative Negative    RBC, UA 3 <=3 /HPF    WBC, UA 3 (H) <=2 /HPF    Squam Epithel, UA <1 0 - 5 /HPF    Bacteria, UA None Seen None Seen /HPF    Hyaline Casts, UA 6 (H) 0 - 1 /LPF    Mucus, UA Many (A) None Seen /HPF   CBC    Collection Time: 01/19/20  6:51 AM   Result Value Ref Range    WBC 15.6 (H) 4.5 - 11.0 10*9/L    RBC 5.69 4.50 - 5.90 10*12/L    HGB 14.3 13.5 - 17.5 g/dL  HCT 44.5 41.0 - 53.0 %    MCV 78.2 (L) 80.0 - 100.0 fL    MCH 25.2 (L) 26.0 - 34.0 pg    MCHC 32.2 31.0 - 37.0 g/dL    RDW 16.1 (H) 09.6 - 15.0 %    MPV 8.3 7.0 - 10.0 fL    Platelet 392 150 - 440 10*9/L       Imaging:   CT Abdomen Pelvis with IV Contrast ONLY   Preliminary Result   Closed loop bowel obstruction within the mid abdominal ventral hernia, with distally decompressed small bowel. No evidence of strangulation on this CT.      Multiple peritoneal implants and prominent abdominal and pelvic lymph nodes, consistent with patient's known metastatic melanoma and better appreciated on prior MRI. Lymph nodes are less prominent than on prior imaging.      Additional chronic and incidental findings, as noted above.      The findings of this study were discussed via telephone with DR. Eliberto Ivory C FLICK by Dr. Jake Bathe on 01/19/2020 3:26 AM.         XR Abdomen 1 View   Final Result   Nonspecific singular loop of dilated bowel with gas and stool distally in the colon and rectum.            Morene Rankins, DO  PGY-2 General Surgery  01/19/20 7:16 AM

## 2020-01-19 NOTE — Unmapped (Signed)
[facility], ACUTE CARE, and GENERAL SURGERY   - Surgery Daily Progress Note -  01/19/2020       Admit Date: 01/18/2020, Hospital Day: 2  Hospital Service: [facility])      Assessment     Kenneth Fitzgerald Kenneth Fitzgerald 45 y.o. 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     ID:  - Afebrile.  - WBC- 12.2 from 15.6    PPx:   - Heparin     Dispo: Floor status  PT/OT: not indicated  Barriers to discharge: awaiting return of bowel function  CM/SW assisting in discharge planning.     Contact: [facility]  [name]  [phone]      Subjective:   No acute events overnight. Pain Controlled. Reports some increased pain, but is passing flatus.    Objective:     Vital signs in last 24 hours:  Temp:  [36.8 ??C (98.2 ??F)] 36.8 ??C (98.2 ??F)  Heart Rate:  [98-118] 104  SpO2 Pulse:  [102-114] 102  Resp:  [16-23] 19  BP: (100-140)/(68-111) 105/75  MAP (mmHg):  [78-85] 85  SpO2:  [93 %-100 %] 94 %    Intake/Output last 24 hours:  I/O last 3 completed shifts:  In: 3170 [IV Piggyback:3170]  Out: -     Physical Exam:  -General:  Appropriate, comfortable and in no apparent distress.   -Neurological: Alert and oriented x3. Moves all 4 extremities spontaneously.   -Pulmonary: Normal work of breathing. No accessory muscle use.  -Abdomen: Soft, distended, non tender to palpation, midline incision well healed, ventral hernia reduced.  -Genitourinary: Voiding spontaneously.   -  Extremities: Warm, well perfused, normal skin turgor.    Data Review:    All lab results last 24 hours:    Recent Results (from the past 24 hour(s))   Lactic Acid, Venous, Whole Blood    Collection Time: 01/19/20  3:55 PM   Result Value Ref Range    Lactate, Venous 2.2 (H) 0.5 - 1.8 mmol/L   POCT Glucose    Collection Time: 01/19/20  9:11 PM   Result Value Ref Range    Glucose, POC 127 70 - 179 mg/dL   Basic Metabolic Panel    Collection Time: 01/20/20  3:45 AM   Result Value Ref Range    Sodium 139 135 - 145 mmol/L    Potassium 3.8 3.4 - 4.5 mmol/L    Chloride 104 98 - 107 mmol/L    CO2 26.0 20.0 - 31.0 mmol/L    Anion Gap 9 5 - 14 mmol/L    BUN 19 9 - 23 mg/dL    Creatinine 4.40 1.02 - 1.10 mg/dL    BUN/Creatinine Ratio 17     EGFR CKD-EPI Non-African American, Male 47 >=60 mL/min/1.89m2    EGFR CKD-EPI African American, Male >90 >=60 mL/min/1.27m2    Glucose 104 70 - 179 mg/dL    Calcium 7.5 (L) 8.7 - 10.4 mg/dL   Magnesium Level    Collection Time: 01/20/20  3:45 AM   Result Value Ref Range    Magnesium 2.3 1.6 - 2.6 mg/dL   Phosphorus Level    Collection Time: 01/20/20  3:45 AM   Result Value Ref Range    Phosphorus 6.9 (H) 2.4 - 5.1 mg/dL   CBC w/ Differential    Collection Time: 01/20/20  3:45 AM   Result Value Ref Range    WBC 12.2 (H) 4.5 - 11.0 10*9/L    RBC 5.34 4.50 - 5.90 10*12/L    HGB 13.5 13.5 - 17.5 g/dL    HCT 72.5 36.6 - 44.0 %    MCV 77.7 (L) 80.0 - 100.0 fL    MCH 25.3 (L) 26.0 - 34.0 pg    MCHC 32.5 31.0 - 37.0 g/dL    RDW 34.7 (H) 42.5 - 15.0 %    MPV 10.4 (H) 7.0 - 10.0 fL    Platelet 421 150 - 440 10*9/L    Neutrophils % 80.0 %    Lymphocytes % 12.2 %    Monocytes % 5.1 %    Eosinophils % 1.7 %    Basophils % 0.4 %    Neutrophil Left Shift 1+ (A) Not Present    Absolute Neutrophils 9.7 (H) 2.0 - 7.5 10*9/L    Absolute Lymphocytes 1.5 1.5 - 5.0 10*9/L    Absolute Monocytes 0.6 0.2 - 0.8 10*9/L    Absolute Eosinophils 0.2 0.0 - 0.4 10*9/L    Absolute Basophils 0.1 0.0 - 0.1 10*9/L    Large Unstained Cells 1 0 - 4 %    Microcytosis Moderate (A) Not Present    Anisocytosis Slight (A) Not Present   POCT Glucose    Collection Time: 01/20/20  6:49 AM   Result Value Ref Range    Glucose, POC 100 70 - 179 mg/dL         Imaging: Radiology studies were personally reviewed

## 2020-01-19 NOTE — Unmapped (Signed)
[facility] Emergency Department Provider Note      ED Course, Assessment and Plan     Initial Clinical Impression:    January 18, 2020 7:53 PM   Kenneth Fitzgerald is a 45 y.o. male presenting with 1.5 weeks of indigestion and worsening nausea with associated abdominal cramping over the past 24 hours as described below. On exam, the patient has a midline abdominal surgical scar with a palpable and non tender ventral hernia. His abdomen is non tender, protuberant, but not tympanic. No rebound or guarding. Exam otherwise unremarkable.     BP 100/68  - Pulse 101  - Temp 36.8 ??C (98.2 ??F) (Oral)  - Resp 23  - SpO2 93%     Assessment and Plan: Given patient's current radiation therapy to the brain, would have some concern that his nausea and vomiting as a result of this. He is, however, significantly more symptomatic than he has been in the past and I would have a higher concern for bowel obstruction. He has had an ileus in the past, but does have a history of Crohn's with bowel resection and multiple abdominal surgeries. Given his lack of fevers or other infectious symptoms, I have a lower suspicion for COVID-19 or infectious process. His cramping is likely secondary to hypocalcemia which she has a history of in the past. He also has some element of ectopy on his monitor which may be a result of hypocalcemia as well.    Cardiac monitor ordered due to hypocalcemia to monitor for dysrhythmias and significant changes in blood pressure. As interpreted by me, monitor showed mild intermittent nonspecific dysrhythmias, otherwise sinus rhythm    We will obtain CT of the abdomen and provide symptomatic control.    Diagnostic and treatment orders as below.    Orders Placed This Encounter   Procedures   ??? RAPID INFLUENZA/RSV/COVID PCR   ??? CT Abdomen Pelvis with oral and IV Contrast   ??? XR Abdomen 1 View   ??? CBC w/ Differential   ??? Comprehensive Metabolic Panel   ??? Lipase Level   ??? Urinalysis with Culture Reflex   ??? ED Extra Tubes   ??? Blood Gas Critical Care Panel, Venous   ??? Ionized Calcium, Venous   ??? Magnesium Level   ??? Monitor   ??? ECG 12 Lead       Medications   calcium gluconate 1 g in sodium chloride (NS) 0.9 % 100 mL IVPB (has no administration in time range)   magnesium sulfate in water 2 gram/50 mL (4 %) IVPB 2 g (2 g Intravenous New Bag 01/18/20 2121)   lactated ringers bolus 1,000 mL (0 mL Intravenous Stopped 01/18/20 2109)   promethazine (PHENERGAN) 25 mg in sodium chloride (NS) 0.9 % 25 mL infusion (25 mg Intravenous Given 01/18/20 2012)   iohexol (OMNIPAQUE) 240 mg iodine/mL oral solution 50 mL (50 mL Oral Given 01/18/20 2121)   lactated ringers bolus 2,000 mL (2,000 mL Intravenous New Bag 01/18/20 2121)   promethazine (PHENERGAN) 25 mg in sodium chloride (NS) 0.9 % 25 mL infusion (25 mg Intravenous Given 01/18/20 2150)       ED Course:     20:31  Patient's lactate is elevated to 5.7 and his white count is 17.5. Suspect is likely secondary to hypovolemia with vomiting. He is hypocalcemic. His white count is likely a result of both vomiting and steroid use. We will replete his calcium and magnesium, and give additional fluid bolus. Patient has persistent  vomiting, will redose Phenergan.     10:26 PM patient signed out to oncoming resident at end of shift.  CT scan is pending.  Disposition pending CT.    ___________    The case was discussed with attending physician who is in agreement with the above assessment and plan    Additional Medical Decision Making     I have reviewed the vital signs and the nursing notes. Labs and radiology results that were available during my care of the patient were independently interpreted  by me and considered in my medical decision making.   I independently interpreted the EKG tracing if performed  I independently interpreted the radiology images if performed  I reviewed the patient's prior medical records if available.  Additional history obtained from family if available    History     CHIEF COMPLAINT:   Chief Complaint   Patient presents with   ??? Emesis       HPI: Kenneth Fitzgerald is a 45 y.o. male with a PMH of recurrent thyroid cancer (s/p RT to the neck in 2015), stage IV melanoma, diverticulitis, ileus, and atypical Crohn's disease (s/p bowel resection) who presents to the ED for evaluation of emesis. The patient reports 1.5 weeks of indigestion and worsening nausea with associated abdominal cramping. The cramping became severe last night and then this morning he began vomiting more than usual. He states that the abdominal soreness worsens with dry heaving. He has not had any bowel movements since yesterday and hasn't passed any gas since last night. The patient reports that today he was supposed to have his 10th and final day of brain radiation. A week ago he began to experience a headache and nausea and was prescribed 4 mg of dexamethasone BID and 8mg  of Zofran TID by his oncologist. He has not had any chemo on this round of cancer treatment. The patient denies any fevers.     PAST MEDICAL HISTORY/PAST SURGICAL HISTORY:   Past Medical History:   Diagnosis Date   ??? Cancer (CMS-HCC)     melanoma, thyroid cancer   ??? Disease of thyroid gland     hypothyroid   ??? Hypothyroidism    ??? Skin cancer        Past Surgical History:   Procedure Laterality Date   ??? PR COLONOSCOPY W/BIOPSY SINGLE/MULTIPLE N/A 01/09/2018    Procedure: COLONOSCOPY, FLEXIBLE, PROXIMAL TO SPLENIC FLEXURE; WITH BIOPSY, SINGLE OR MULTIPLE;  Surgeon: Vonda Antigua, MD;  [facility];  Service: Gastroenterology   ??? PR COLONOSCOPY W/BIOPSY SINGLE/MULTIPLE N/A 08/24/2019    Procedure: COLONOSCOPY, FLEXIBLE, PROXIMAL TO SPLENIC FLEXURE; WITH BIOPSY, SINGLE OR MULTIPLE;  Surgeon: Leland Her, MD;  Location: [facility];  Service: Gastroenterology   ??? PR COLSC FLX WITH DIRECTED SUBMUCOSAL NJX ANY SBST N/A 08/24/2019    Procedure: COLONOSCOPY, FLEXIBLE, PROXIMAL TO SPLENIC FLEXURE; WITH DIRECTED SUBMUCOSAL INJECTION(S), ANY SUBSTANCE;  Surgeon: Leland Her, MD;  [facility];  Service: Gastroenterology   ??? PR IMPLANT MESH HERNIA REPAIR/DEBRIDEMENT CLOSURE N/A 01/03/2015    Procedure: IMPLANTATION OF MESH/OTHER PROSTHES INCISION/VENTRAL HERNIA REPAIR/MESH CLOSE DEBRID NECROT SOFT TIS INFECT;  Surgeon: Romero Belling, MD;  Location: MAIN OR [place];  Service: Gastrointestinal   ??? PR LAP, VENTRAL HERNIA REPAIR,REDUCIBLE N/A 04/07/2014    Procedure: LAPAROSCOPY, SURGICAL, REPAIR, VENTRAL, UMBILICAL, SPIGELIAN OR EPIGASTRIC HERNIA, REDUCIBLE;  Surgeon: Romero Belling, MD;  Location: MAIN OR [facility];  Service: Gastrointestinal   ???  PR REMOVAL NODES, NECK,CERV CMPLT Left 12/07/2012    Procedure: CERVICAL LYMPHADENECTOMY (COMPLETE);  Surgeon: Charlott Rakes, MD;  Location: MAIN OR [facility];  Service: Surgical Oncology   ??? PR REMOVAL NODES, NECK,CERV MOD RAD Right 03/29/2013    Procedure: CERVICAL LYMPHADENECTOMY (MODIFIED RADICAL NECK DISSECTION);  Surgeon: Charlott Rakes, MD;  Location: MAIN OR [facility];  Service: Surgical Oncology   ??? PR REPAIR RECURR INCIS HERNIA,REDUC N/A 01/03/2015    Procedure: REPAIR RECURRENT INCISIONAL OR VENTRAL HERNIA; REDUCIBLE;  Surgeon: Romero Belling, MD;  Location: MAIN OR [place];  Service: Gastrointestinal   ??? PR REPAIR RECURR INCIS HERNIA,STRANG N/A 06/21/2016    Procedure: REPAIR RECURRENT INCISIONAL OR VENTRAL HERNIA; INCARCERATED OR STRANGULATED;  Surgeon: Mickle Asper, MD;  Location: MAIN OR ;  Service: Gastrointestinal   ??? PR SIGMOIDOSCOPY,FINE NEEDL BX,US GUIDED N/A 01/09/2018    Procedure: SIGMOIDOSCOPY, FLEXIBLE, W/TRANSENDOSCOPIC ULTRASOUND GUIDED NEEDLE ASPIRATION;  Surgeon: Vonda Antigua, MD;  [facility];  Service: Gastroenterology       MEDICATIONS:     Current Facility-Administered Medications:   ???  calcium gluconate 1 g in sodium chloride (NS) 0.9 % 100 mL IVPB, 1 g, Intravenous, Once, Ola Spurr, MD  ???  magnesium sulfate in water 2 gram/50 mL (4 %) IVPB 2 g, 2 g, Intravenous, Once, Ola Spurr, MD, Last Rate: 25 mL/hr at 01/18/20 2121, 2 g at 01/18/20 2121    Current Outpatient Medications:   ???  binimetinib ([name]) 15 mg tablet, Take 3 tablets (45 mg total) by mouth Two (2) times a day., Disp: 180 tablet, Rfl: 11  ???  blood sugar diagnostic Strp, Dispense 100 blood glucose test strips, ok to sub any brand preferred by insurance/patient, use 3x/day; dispense whatever brand matches with meter., Disp: 100 strip, Rfl: 12  ???  blood-glucose meter kit, Use as instructed; dispense 1 meter, whatever is preferred by insurance, Disp: 1 each, Rfl: 1  ???  calcitrioL (ROCALTROL) 0.25 MCG capsule, Take 1 capsule (0.25 mcg total) by mouth daily., Disp: 30 capsule, Rfl: 11  ???  calcium carbonate 650 mg calcium (1,625 mg) tablet, Take 1 tablet by mouth Three (3) times a day with a meal., Disp: , Rfl:   ???  dexAMETHasone (DECADRON) 4 MG tablet, 2 Tab PO BID, Disp: 56 tablet, Rfl: 2  ???  DULoxetine (CYMBALTA) 30 MG capsule, Take 1 capsule (30 mg total) by mouth Two (2) times a day., Disp: 60 capsule, Rfl: 3  ???  encorafenib (BRAFTOVI) 75 mg capsule, Take 6 capsules (450 mg total) by mouth daily., Disp: 180 capsule, Rfl: 11  ???  fexofenadine (ALLEGRA ALLERGY) 180 MG tablet, Take 180 mg by mouth daily as needed. , Disp: , Rfl:   ???  folic acid (FOLVITE) 1 MG tablet, Take 1 tablet (1 mg total) by mouth daily., Disp: 30 tablet, Rfl: 11  ???  ibuprofen (ADVIL,MOTRIN) 200 MG tablet, Take 600 mg by mouth daily as needed for pain., Disp: , Rfl:   ???  lancets Misc, Dispense 100 lancets, ok to sub any brand preferred by insurance/patient, use 3x/day, Disp: 100 each, Rfl: 12  ???  levothyroxine (SYNTHROID) 125 MCG tablet, Take LT4 250 mcg x 2 days/week (Sat-Sun), and 375 mcg x 5 days/week (Mon-Fri), Disp: 250 tablet, Rfl: 4  ???  melatonin 3 mg Tab, Take 1 tablet (3 mg total) by mouth every evening., Disp: 90 tablet, Rfl: 1  ???  memantine (NAMENDA) 5 MG tablet,  Take 5mg  daily for 1 week, 5mg  twice daily for week 2, 10mg  in the morning and 5mg  in the evening for week 3, 10mg  twice daily for week 4. Continue 10mg  twice daily for a total of 6 months., Disp: 630 tablet, Rfl: 0  ???  metFORMIN (GLUCOPHAGE) 500 MG tablet, Take 1 tablet (500 mg total) by mouth 2 (two) times a day with meals., Disp: 180 tablet, Rfl: 3  ???  ondansetron (ZOFRAN) 8 MG tablet, Take 1 tablet (8 mg total) by mouth every eight (8) hours as needed for nausea., Disp: 30 tablet, Rfl: 2  ???  sulfaSALAzine (AZULFIDINE) 500 mg tablet, Take 2 tablets (1000mg ) by mouth twice daily for 7 days, then take 4 tablets (2000mg ) twice daily thereafter., Disp: 240 tablet, Rfl: 11    ALLERGIES:   Compazine [prochlorperazine], Coconut, and Multihance [gadobenate dimeglumine]    SOCIAL HISTORY:   Social History     Tobacco Use   ??? Smoking status: Never Smoker   ??? Smokeless tobacco: Never Used   Substance Use Topics   ??? Alcohol use: Yes     Alcohol/week: 0.0 standard drinks     Comment: social drinker        FAMILY HISTORY:  Family History   Problem Relation Age of Onset   ??? Hyperthyroidism Mother    ??? Osteoporosis Mother    ??? Arrhythmia Mother    ??? Squamous cell carcinoma Mother         basal cell vs squamous cell skin cancer   ??? Coronary artery disease Father         s/p CABG   ??? Diabetes Father    ??? Hypertension Father    ??? Prostate cancer Father    ??? Colon cancer Paternal Grandmother    ??? Thyroid disease Neg Hx           Review of Systems    Remainder of obtained ROS was negative except as stated in the HPI    Physical Exam     VITAL SIGNS:    BP 100/68  - Pulse 101  - Temp 36.8 ??C (98.2 ??F) (Oral)  - Resp 23  - SpO2 93%     Constitutional: Alert and oriented. Uncomfortable appearing and in mild distress.  Eyes: Conjunctivae are normal. Sclera non-icteric  ENT       Head: Normocephalic and atraumatic.       Mouth/Throat: Mucous membranes are moist. No bleeding mucus membranes       Neck: No audible stridor. Trachea is midline  Cardiovascular: No peripheral cyanosis. Normal heart rate  Respiratory: Normal respiratory effort. No audible wheezes   Gastrointestinal: Midline abdominal surgical scar with palpable and non tender ventral hernia. Abdomen is non tender, protuberant, but not tympanic. No rebound or guarding.   Musculoskeletal: No obvious long bone deformities  Neurologic: Normal speech and language. No gross focal neurologic deficits are appreciated.  Skin: Skin is warm, dry and intact. No rash noted.  Psychiatric: Mood and affect are normal. Speech and behavior are normal.    Radiology     CT Abdomen Pelvis with oral and IV Contrast    (Results Pending)   XR Abdomen 1 View    (Results Pending)       Labs     Labs Reviewed   COMPREHENSIVE METABOLIC PANEL - Abnormal; Notable for the following components:       Result Value    Anion Gap 16 (*)     Creatinine  1.34 (*)     Calcium 8.4 (*)     Total Protein 8.5 (*)     AST 50 (*)     All other components within normal limits   BLOOD GAS CRITICAL CARE PANEL, VENOUS - Abnormal; Notable for the following components:    pCO2, Ven 32 (*)     HCO3, Ven 21 (*)     Base Excess, Ven -2.5 (*)     Calcium, Ionized Venous 3.64 (*)     Lactate, Venous 5.7 (*)     All other components within normal limits   IONIZED CALCIUM VENOUS - Abnormal; Notable for the following components:    Calcium, Ionized Venous 3.64 (*)     All other components within normal limits   CBC W/ AUTO DIFF - Abnormal; Notable for the following components:    WBC 17.5 (*)     RBC 6.23 (*)     MCV 78.8 (*)     MCH 25.1 (*)     RDW 17.3 (*)     Platelet 497 (*)     Neutrophil Left Shift 1+ (*)     Absolute Neutrophils 14.6 (*)     Microcytosis Moderate (*)     Anisocytosis Slight (*)     Hypochromasia Slight (*)     All other components within normal limits    Narrative:     Please use the Absolute Differential for reference ranges.    RAPID INFLUENZA/RSV/COVID PCR - Normal    Narrative:     This test was performed using the Cepheid Xpert Xpress SARS-CoV-2/Flu/RSV assay, which has been validated by the CLIA-certified, CAP-inspected [employer] FDA has granted Emergency Use Authorization for this test. Negative results do not preclude infection and should be interpreted along with clinical observations, patient history, and epidemiological information. Information for providers and patients can be found here: [email or web address]   LIPASE - Normal   MAGNESIUM - Normal   CBC W/ DIFFERENTIAL    Narrative:     The following orders were created for panel order CBC w/ Differential.  Procedure                               Abnormality         Status                     ---------                               -----------         ------                     CBC w/ Differential[[id number]]         Abnormal            Final result                 Please view results for these tests on the individual orders.   URINALYSIS WITH CULTURE REFLEX   EXTRA TUBES    Narrative:     The following orders were created for panel order ED Extra Tubes.  Procedure                               Abnormality  Status                     ---------                               -----------         ------                     LIGHT BLUE CITRATE EXTR.Marland KitchenMarland Kitchen[[id number]]                      Final result               PINK EXTRA TUBE[[id number]]                                 In process                   Please view results for these tests on the individual orders.   LIGHT BLUE CITRATE EXTRA TUBE    Narrative:     Collected and received in lab.   PINK EXTRA TUBE       EKG: Normal sinus rhythm with sinus tachycardia. Pre-existing right bundle branch block. No acute changes to indicate ischemia in the ST or T waves.    Pertinent labs & imaging results that were available during my care of the patient were reviewed by me and considered in my medical decision making (see chart for details).    Please note- This chart has been created using [employer]. Chart creation errors have been sought, but may not always be located and such creation errors, especially pronoun confusion, do NOT reflect on the standard of medical care.    Earlie Raveling  EM PGY3    Documentation assistance was provided by Michaelle Birks, Scribe, on January 18, 2020 at 19:53 for Earlie Raveling, MD.        January 18, 2020 9:43 PM. Documentation assistance provided by the above mentioned scribe. I was present during the time the encounter was recorded. The information recorded by the scribe was done at my direction and has been reviewed and validated by me.   Earlie Raveling, MD PGY-3         Ola Spurr, MD  Resident  01/18/20 [phone]

## 2020-01-19 NOTE — Unmapped (Addendum)
Kenneth Fitzgerald 45 y.o. 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      # AKI on admission: likely due to nausea, poor PO intake and vomiting.  Creatinine on admission 1.34.  MIVF administered.  At discharge, creatinine was WNL.      # Metastatic melanoma with carcinomatosis and liver involvement, recurrent thyroid cancer s/p RT to neck 2015: oncology consulted and recommended to continue dexamethaxone and home dabrafenib/trametinib.  Radiation oncology was also consulted to treat while inpatient if necessary.   Pt will discharge and continue regular treatment as outpatient.  Discussed with rad onc; prednisone taper completed.    # SBO: NGT placed and clamp trial performed 1/6 removed on 1/6, pt initially tolerated well however had recurrence of nausea and vomiting requiring re-insertion on 1/8.  Repeat CT imaging showed closed loop bowel obstruction.  Patient was taken to the OR  on 1/10 for an Open primary ventral hernia repair with LOA of 90 minutes. with Dr. Carlynn Purl. Operative findings: Melanoma implants within peritoneum and mesentery.  Chronically dilated SB from jejunum to proximal ileum.  Incarcerated small bowel appeared viable.  Retained mesh at the lateral abdomen without any appreciable mesh in the midline. JP drain placed in the subcutaneous tissue, which will be left in place with tentative plan to remove in clinic in 1 week.     His diet was advanced and by discharge he was tolerating a regular diet. He was voiding adequately, ambulating independently, and his pain was controlled wth PO pain medications. He was examined by the Trauma Surgery team on the day of discharge and was deemed suitable for discharge home. He will be discharged on HD #3 in good condition.    I spent greater than 30 minutes completing this discharge.

## 2020-01-19 NOTE — Unmapped (Signed)
Oncology Consult Note    Requesting Attending Physician :  Steele Berg Drees*  Service Requesting Consult : Peter Garter [facility])  Reason for Consult: metastatic melanoma  Primary Oncologist: Dr. Lendell Caprice    Assessment: Kenneth Fitzgerald is a 45 y.o.  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 Oncology consulted for management recommendations given underlying malginancy. CT demonstrated closed loop bowel obstruction within mid abdominal ventral hernia. While peritoneal implants and abdominal/pelvic adenopathy visualized, lymph nodes less priominent than on prior imaging and obstruction does not appear to be secondary to malignancy. We agree with continued management of SBO per our surgical colleagues. Pending clinical course, may be able to complete WBRT while in house. Will discuss resumption of oral chemotherapy agents once patient tolerating PO. He may continue dexamethasone for radiation side effects with rad onc will taper (although unclear degree to which this contributing to nausea given discovery of SBO).      Recommendations:   -Discussed with rad onc; would like patient to continue dexamethasone 4mg  BID for now (can give IV until patient is tolerating PO) & then will provide a taper.  -Rad onc may treat while inpatient if clinically improving  -Please request home supply of dabrafenib/trametinib be brought in and stored in central pharmacy (patient states sister likely able to due this)  -Primary oncologist notified    This patient has been seen and discussed with Dr. Selena Batten. These recommendations were discussed with the primary team.     Please contact the hematology fellow at [phone] with any further questions.    Wendie Simmer, MD  Hematology-Oncology Fellow    -------------------------------------------------------------    HPI: Gatsby Chismar is a 45 y.o. 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 Oncology consulted for management recommendations given underlying malginancy.     Patient states that he has been having worsening nausea over last week or 2, with acute worsening of nausea and abdominal pain Monday night, prompting ED visit. He had previously been started on steroids and zofran for nausea given concerns may be related to CNS radiation effects. CT demonstrated closed loop bowel obstruction within mid abdominal ventral hernia. While peritoneal implants and abdominal/pelvic adenopathy visualized, lymph nodes less priominent than on prior imaging and obstruction does not appear to be secondary to malignancy. Hernia is compressible and he is being managed medically with NGT, fluids, bowel rest. His pain and nausea have improved some what    Headaches have improved. Last took dabrafenib/trametinib Monday night. Sister may be able to bring medications.    Regarding metastatic melanoma -- he has on BRAF/MEK inihibtor since May 2020, with stable abdominalpelvic diease. However, he has had progressive punctate CNS lesions. He is currently receiving WBRT -- planned for 3000cGy in 10 fractions, which he was planned to complete this week.    Review of Systems: Review of Systems - Per HPI    Oncology History   Melanoma (CMS-HCC)   03/17/2013 Initial Diagnosis    Melanoma (CMS-HCC)     02/12/2018 - 03/12/2018 Chemotherapy    OP NIVOLUMAB 480 MG Q4W  nivolumab 480 mg every 28 days     Malignant  neoplasm of brain, unspecified location (CMS-HCC)   12/22/2019 Initial Diagnosis    Malignant neoplasm of brain, unspecified location (CMS-HCC)     12/22/2019 - Radiation    Radiation Therapy Treatment Details (Noted on 12/22/2019)  Site: Brain  Technique: IMRT  Goal: No goal specified  Planned Treatment Start Date: No planned start date specified           Past Medical History:   Diagnosis Date   ??? Cancer (CMS-HCC)     melanoma, thyroid cancer   ??? Disease of thyroid gland     hypothyroid   ??? Hypothyroidism    ??? Skin cancer        Past Surgical History:   Procedure Laterality Date   ??? PR COLONOSCOPY W/BIOPSY SINGLE/MULTIPLE N/A 01/09/2018    Procedure: COLONOSCOPY, FLEXIBLE, PROXIMAL TO SPLENIC FLEXURE; WITH BIOPSY, SINGLE OR MULTIPLE;  Surgeon: Vonda Antigua, MD;  [facility];  Service: Gastroenterology   ??? PR COLONOSCOPY W/BIOPSY SINGLE/MULTIPLE N/A 08/24/2019    Procedure: COLONOSCOPY, FLEXIBLE, PROXIMAL TO SPLENIC FLEXURE; WITH BIOPSY, SINGLE OR MULTIPLE;  Surgeon: Leland Her, MD;  [facility];  Service: Gastroenterology   ??? PR COLSC FLX WITH DIRECTED SUBMUCOSAL NJX ANY SBST N/A 08/24/2019    Procedure: COLONOSCOPY, FLEXIBLE, PROXIMAL TO SPLENIC FLEXURE; WITH DIRECTED SUBMUCOSAL INJECTION(S), ANY SUBSTANCE;  Surgeon: Leland Her, MD;  [facility];  Service: Gastroenterology   ??? PR IMPLANT MESH HERNIA REPAIR/DEBRIDEMENT CLOSURE N/A 01/03/2015    Procedure: IMPLANTATION OF MESH/OTHER PROSTHES INCISION/VENTRAL HERNIA REPAIR/MESH CLOSE DEBRID NECROT SOFT TIS INFECT;  Surgeon: Romero Belling, MD;  Location: MAIN OR [place];  Service: Gastrointestinal   ??? PR LAP, VENTRAL HERNIA REPAIR,REDUCIBLE N/A 04/07/2014    Procedure: LAPAROSCOPY, SURGICAL, REPAIR, VENTRAL, UMBILICAL, SPIGELIAN OR EPIGASTRIC HERNIA, REDUCIBLE;  Surgeon: Romero Belling, MD;  Location: MAIN OR [place];  Service: Gastrointestinal   ??? PR REMOVAL NODES, NECK,CERV CMPLT Left 12/07/2012    Procedure: CERVICAL LYMPHADENECTOMY (COMPLETE);  Surgeon: Charlott Rakes, MD;  Location: [facility] OR [facility];  Service: Surgical Oncology   ??? PR REMOVAL NODES, NECK,CERV MOD RAD Right 03/29/2013    Procedure: CERVICAL LYMPHADENECTOMY (MODIFIED RADICAL NECK DISSECTION);  Surgeon: Charlott Rakes, MD;  Location: MAIN OR [facility];  Service: Surgical Oncology   ??? PR REPAIR RECURR INCIS HERNIA,REDUC N/A 01/03/2015    Procedure: REPAIR RECURRENT INCISIONAL OR VENTRAL HERNIA; REDUCIBLE;  Surgeon: Romero Belling, MD;  Location: MAIN OR [place];  Service: Gastrointestinal   ??? PR REPAIR RECURR INCIS HERNIA,STRANG N/A 06/21/2016    Procedure: REPAIR RECURRENT INCISIONAL OR VENTRAL HERNIA; INCARCERATED OR STRANGULATED;  Surgeon: Mickle Asper, MD;  Location: MAIN OR [place];  Service: Gastrointestinal   ??? PR SIGMOIDOSCOPY,FINE NEEDL BX,US GUIDED N/A 01/09/2018    Procedure: SIGMOIDOSCOPY, FLEXIBLE, W/TRANSENDOSCOPIC ULTRASOUND GUIDED NEEDLE ASPIRATION;  Surgeon: Vonda Antigua, MD;  [facility];  Service: Gastroenterology       Family History   Problem Relation Age of Onset   ??? Hyperthyroidism Mother    ??? Osteoporosis Mother    ??? Arrhythmia Mother    ??? Squamous cell carcinoma Mother         basal cell vs squamous cell skin cancer   ??? Coronary artery disease Father         s/p CABG   ??? Diabetes Father    ??? Hypertension Father    ??? Prostate cancer Father    ??? Colon cancer Paternal Grandmother    ???  Thyroid disease Neg Hx        Social History     Socioeconomic History   ??? Marital status: Single     Spouse name: Not on file   ??? Number of children: Not on file   ??? Years of education: Not on file   ??? Highest education level: Not on file   Occupational History   ??? Not on file   Tobacco Use   ??? Smoking status: Never Smoker   ??? Smokeless tobacco: Never Used   Vaping Use   ??? Vaping Use: Never used   Substance and Sexual Activity   ??? Alcohol use: Yes     Alcohol/week: 0.0 standard drinks     Comment: social drinker    ??? Drug use: No   ??? Sexual activity: Not on file   Other Topics Concern   ??? Not on file   Social History Narrative   ??? Not on file     Social Determinants of Health     Financial Resource Strain: Not on file   Food Insecurity: Not on file   Transportation Needs: Not on file   Physical Activity: Not on file   Stress: Not on file   Social Connections: Not on file       Allergies: is allergic to compazine [prochlorperazine], coconut, and multihance [gadobenate dimeglumine].    Medications:   Meds:  ??? heparin (porcine) for subcutaneous use  5,000 Units Subcutaneous Q12H [facility]   ??? MORPhine         Continuous Infusions:  ??? lactated Ringers       PRN Meds:.MORPhine injection, ondansetron    Objective:   Vitals: Temp:  [36.8 ??C (98.2 ??F)] 36.8 ??C (98.2 ??F)  Heart Rate:  [98-118] 104  SpO2 Pulse:  [102-114] 102  Resp:  [16-23] 19  BP: (100-140)/(68-111) 105/75  MAP (mmHg):  [78-85] 85  SpO2:  [93 %-100 %] 94 %    Physical Exam:  BP 105/75  - Pulse 104  - Temp 36.8 ??C (98.2 ??F) (Oral)  - Resp 19  - SpO2 94%    Gen: Alert, cooperative  Mental status: oriented, appropriate  HEENT: sclera anicteric, EOMI, NGT in place  Pulm: Breathing comfortably on RA, no accessory muscle movement  CV: RRR  Abd: moderately distended, mild TTP diffusely but not rigid. Midline abdominal scar with reducible ventral hernia. Paucity of bowel sounds  Ext: warm, no edema  Skin: No significant bruising, bleeding or rash  Neuro: Moving all extremities, face symmetric, speech intact, no focal deficits      Test Results  Recent Labs     01/18/20  1949 01/19/20  0651   WBC 17.5* 15.6*   NEUTROABS 14.6*  --    HGB 15.7 14.3   PLT 497* 392       Imaging: personally reviewed

## 2020-01-20 ENCOUNTER — Ambulatory Visit: Admit: 2020-01-20 | Discharge: 2020-01-21

## 2020-01-20 DIAGNOSIS — Z510 Encounter for antineoplastic radiation therapy: Principal | ICD-10-CM

## 2020-01-20 DIAGNOSIS — C719 Malignant neoplasm of brain, unspecified: Principal | ICD-10-CM

## 2020-01-20 DIAGNOSIS — C7931 Secondary malignant neoplasm of brain: Principal | ICD-10-CM

## 2020-01-20 DIAGNOSIS — K56609 Unspecified intestinal obstruction, unspecified as to partial versus complete obstruction: Principal | ICD-10-CM

## 2020-01-20 DIAGNOSIS — C73 Malignant neoplasm of thyroid gland: Principal | ICD-10-CM

## 2020-01-20 LAB — CBC W/ AUTO DIFF
BASOPHILS ABSOLUTE COUNT: 0.1 10*9/L (ref 0.0–0.1)
BASOPHILS RELATIVE PERCENT: 0.4 %
EOSINOPHILS ABSOLUTE COUNT: 0.2 10*9/L (ref 0.0–0.4)
EOSINOPHILS RELATIVE PERCENT: 1.7 %
HEMATOCRIT: 41.5 % (ref 41.0–53.0)
HEMOGLOBIN: 13.5 g/dL (ref 13.5–17.5)
LARGE UNSTAINED CELLS: 1 % (ref 0–4)
LYMPHOCYTES ABSOLUTE COUNT: 1.5 10*9/L (ref 1.5–5.0)
LYMPHOCYTES RELATIVE PERCENT: 12.2 %
MEAN CORPUSCULAR HEMOGLOBIN CONC: 32.5 g/dL (ref 31.0–37.0)
MEAN CORPUSCULAR HEMOGLOBIN: 25.3 pg — ABNORMAL LOW (ref 26.0–34.0)
MEAN CORPUSCULAR VOLUME: 77.7 fL — ABNORMAL LOW (ref 80.0–100.0)
MEAN PLATELET VOLUME: 10.4 fL — ABNORMAL HIGH (ref 7.0–10.0)
MONOCYTES ABSOLUTE COUNT: 0.6 10*9/L (ref 0.2–0.8)
MONOCYTES RELATIVE PERCENT: 5.1 %
NEUTROPHILS ABSOLUTE COUNT: 9.7 10*9/L — ABNORMAL HIGH (ref 2.0–7.5)
NEUTROPHILS RELATIVE PERCENT: 80 %
PLATELET COUNT: 421 10*9/L (ref 150–440)
RED BLOOD CELL COUNT: 5.34 10*12/L (ref 4.50–5.90)
RED CELL DISTRIBUTION WIDTH: 17.1 % — ABNORMAL HIGH (ref 12.0–15.0)
WBC ADJUSTED: 12.2 10*9/L — ABNORMAL HIGH (ref 4.5–11.0)

## 2020-01-20 LAB — BASIC METABOLIC PANEL
ANION GAP: 9 mmol/L (ref 5–14)
BLOOD UREA NITROGEN: 19 mg/dL (ref 9–23)
BUN / CREAT RATIO: 17
CALCIUM: 7.5 mg/dL — ABNORMAL LOW (ref 8.7–10.4)
CHLORIDE: 104 mmol/L (ref 98–107)
CO2: 26 mmol/L (ref 20.0–31.0)
CREATININE: 1.09 mg/dL
EGFR CKD-EPI AA MALE: >90 mL/min/{1.73_m2} (ref >=60)
EGFR CKD-EPI NON-AA MALE: 82 mL/min/{1.73_m2} (ref >=60)
GLUCOSE RANDOM: 104 mg/dL (ref 70–179)
POTASSIUM: 3.8 mmol/L (ref 3.4–4.5)
SODIUM: 139 mmol/L (ref 135–145)

## 2020-01-20 LAB — MAGNESIUM: MAGNESIUM: 2.3 mg/dL (ref 1.6–2.6)

## 2020-01-20 LAB — PHOSPHORUS: PHOSPHORUS: 6.9 mg/dL — ABNORMAL HIGH (ref 2.4–5.1)

## 2020-01-20 MED ADMIN — dexamethasone (DECADRON) 4 mg/mL injection 4 mg: 4 mg | INTRAVENOUS | @ 02:00:00 | Stop: 2020-01-20

## 2020-01-20 MED ADMIN — enoxaparin (LOVENOX) syringe 40 mg: 40 mg | SUBCUTANEOUS | @ 02:00:00 | Stop: 2020-02-02

## 2020-01-20 MED ADMIN — DULoxetine (CYMBALTA) DR capsule 30 mg: 30 mg | ORAL | @ 15:00:00 | Stop: 2020-02-02

## 2020-01-20 MED ADMIN — acetaminophen (OFIRMEV) 10 mg/mL injection 1,000 mg: 1000 mg | INTRAVENOUS | @ 17:00:00 | Stop: 2020-01-20

## 2020-01-20 MED ADMIN — levothyroxine (SYNTHROID) tablet 375 mcg: 375 ug | ORAL | @ 15:00:00 | Stop: 2020-01-25

## 2020-01-20 MED ADMIN — dexamethasone (DECADRON) 4 mg/mL injection 4 mg: 4 mg | INTRAVENOUS | @ 15:00:00 | Stop: 2020-01-20

## 2020-01-20 MED ADMIN — enoxaparin (LOVENOX) syringe 40 mg: 40 mg | SUBCUTANEOUS | @ 15:00:00 | Stop: 2020-02-02

## 2020-01-20 MED ADMIN — MORPhine injection 1 mg: 1 mg | INTRAVENOUS | @ 10:00:00 | Stop: 2020-01-20

## 2020-01-20 MED ADMIN — DULoxetine (CYMBALTA) DR capsule 30 mg: 30 mg | ORAL | @ 02:00:00 | Stop: 2020-02-02

## 2020-01-20 NOTE — Unmapped (Signed)
QV:ZDGLO Brai: 01/20/2020: 3,000/3,000 cGy  Pt completed final treatment today and presented to recovery room for final status check. Pt remains inpatient for treatment of small bowel obstruction. Pt reports NG removed today.  Dr. Garner Gavel and Dr. Despina Hick in to see pt.

## 2020-01-20 NOTE — Unmapped (Addendum)
Pt A&Ox4. O2 maintained on room air. NPO sips with meds. ACHS in place; no coverage this shift. Q4 VS. NG tube to low continuous suction. Continent of bowel and bladder. LBM PTA. PIV has been flushed and is infusing. LR @100  is infusing. PRN morphine was administered x1 this shift for pain. Pt's bed is locked, in lowest position, and call light is within reach.     Problem: Adult Inpatient Plan of Care  Goal: Plan of Care Review  Outcome: Progressing  Goal: Patient-Specific Goal (Individualized)  Outcome: Progressing  Goal: Absence of Hospital-Acquired Illness or Injury  Outcome: Progressing  Intervention: Identify and Manage Fall Risk  Recent Flowsheet Documentation  Taken 01/20/2020 0000 by Algis Liming, RN  Safety Interventions:  ??? fall reduction program maintained  ??? lighting adjusted for tasks/safety  ??? low bed  ??? nonskid shoes/slippers when out of bed  Taken 01/19/2020 2200 by Algis Liming, RN  Safety Interventions:  ??? fall reduction program maintained  ??? lighting adjusted for tasks/safety  ??? low bed  ??? nonskid shoes/slippers when out of bed  Taken 01/19/2020 2000 by Algis Liming, RN  Safety Interventions:  ??? fall reduction program maintained  ??? lighting adjusted for tasks/safety  ??? low bed  ??? nonskid shoes/slippers when out of bed  Goal: Optimal Comfort and Wellbeing  Outcome: Progressing  Goal: Readiness for Transition of Care  Outcome: Progressing  Goal: Rounds/Family Conference  Outcome: Progressing     Problem: Impaired Wound Healing  Goal: Optimal Wound Healing  Outcome: Progressing

## 2020-01-20 NOTE — Unmapped (Signed)
RADIATION ON-TREATMENT MANAGEMENT NOTE     Encounter Date: 01/20/2020  Patient Name: Kenneth Fitzgerald  Medical Record Number: [id number]    DIAGNOSIS:  45 y.o. with??history of recurrent thyroid cancer, s/p RT to the neck in 2015 and??stage IV melanoma with stable extracranial disease on dabrafenib/trametinib and multiple punctate lesions (best seen on T1 noncontrast sequence)??in the brain suspicious for metastasis that have been increasing in number since January 2020.    ASSESSMENT: 3000cGy of planned 3000cGy  Karnofsky/Lansky Performance Status: 90,  Able to carry on normal activity; minor signs or symptoms of disease (ECOG equivalent 0)  Tumor/Palliative response: Unable to assess   Chemotherapy/Systemic therapy:administered concurrently  Clinical Trial:   no     RECOMMENDATIONS:  1. Plan for Therapy: Continue treatment as planned - finishing WBRT today. Continues Braftovi/[name].   2. Nausea/Vomiting/headache: Inpatient for SBO. Continue zofran as needed. Advancing diet slowly. Plan to taper dexamethasone - currently receiving 4mg  BID, will reduce to daily now and in 3 days can discontinue.   3. On memantine. Tolerating well.   4. Patient completes radiation today. He is aware that Dr. Despina Hick is leaving [facility]. Has follow up scheduled with Dr. Lendell Caprice on 04/19/2020.    SUBJECTIVE:  Tolerating treatment well. Currently inpatient for small bowel obstruction. Headaches, nausea, vomiting have improved.      PHYSICAL EXAM:  Vital Signs for this encounter:  There were no vitals taken for this visit.  Pain:  Pain Evaluation:                                   Pain Score (0 - 10):                            [phone]::[phone])@  Pain Location:                                      Last weight:    Wt Readings from Last 4 Encounters:   01/20/20 (!) 149.7 kg (330 lb)   01/13/20 (!) 149.3 kg (329 lb 1.6 oz)   01/03/20 (!) 150.4 kg (331 lb 9.2 oz)   01/03/20 (!) 150.4 kg (331 lb 9.6 oz)     General:  Alert and Oriented to conversation.  No acute distress.      _____________________  Fuller Song, MD, PGY-5  [phone]  Radiation Oncology  [facility]    ATTENDING ATTESTATION:  I saw and evaluated/examined the patient, participating in the key portions of the service.  I discussed the findings, assessment, and plan of care with the resident.  I agree with the findings and plan as documented in the resident's note.    Bhishamjit S. Despina Hick, MD  Associate Professor  Director of Patient Safety and Quality  Department of Radiation Oncology  [facility]  [id number]  [place], [place] [address]  O: [phone]  01/25/20 10:15 AM

## 2020-01-20 NOTE — Unmapped (Signed)
Order was placed for PIV by Venous Access Team (VAT).  Patient was assessed at bedside for placement of a PIV. Mask and eye protection were utilized.  Access was obtained. Blood return noted.  Dressing intact and device well secured.  Flushed with normal saline.  Pt advised to inform RN of any s/s of discomfort at the PIV site.    Workup / Procedure Time:  15 minutes      The primary RN was notified.       Thank you,     Tilda Burrow RN Venous Access Team

## 2020-01-20 NOTE — Unmapped (Signed)
Bed: G149  Expected date:   Expected time:   Means of arrival:   Comments:  53

## 2020-01-20 NOTE — Unmapped (Signed)
Bed: G142  Expected date:   Expected time:   Means of arrival:   Comments:

## 2020-01-21 LAB — CBC W/ AUTO DIFF
BASOPHILS ABSOLUTE COUNT: 0 10*9/L (ref 0.0–0.1)
BASOPHILS RELATIVE PERCENT: 0.2 %
EOSINOPHILS ABSOLUTE COUNT: 0.1 10*9/L (ref 0.0–0.4)
EOSINOPHILS RELATIVE PERCENT: 1 %
HEMATOCRIT: 43.1 % (ref 41.0–53.0)
HEMOGLOBIN: 13.6 g/dL (ref 13.5–17.5)
LARGE UNSTAINED CELLS: 1 % (ref 0–4)
LYMPHOCYTES ABSOLUTE COUNT: 0.7 10*9/L — ABNORMAL LOW (ref 1.5–5.0)
LYMPHOCYTES RELATIVE PERCENT: 5.5 %
MEAN CORPUSCULAR HEMOGLOBIN CONC: 31.4 g/dL (ref 31.0–37.0)
MEAN CORPUSCULAR HEMOGLOBIN: 25.1 pg — ABNORMAL LOW (ref 26.0–34.0)
MEAN CORPUSCULAR VOLUME: 79.9 fL — ABNORMAL LOW (ref 80.0–100.0)
MEAN PLATELET VOLUME: 10.5 fL — ABNORMAL HIGH (ref 7.0–10.0)
MONOCYTES ABSOLUTE COUNT: 0.6 10*9/L (ref 0.2–0.8)
MONOCYTES RELATIVE PERCENT: 4.9 %
NEUTROPHILS ABSOLUTE COUNT: 11 10*9/L — ABNORMAL HIGH (ref 2.0–7.5)
NEUTROPHILS RELATIVE PERCENT: 87.8 %
PLATELET COUNT: 328 10*9/L (ref 150–440)
RED BLOOD CELL COUNT: 5.4 10*12/L (ref 4.50–5.90)
RED CELL DISTRIBUTION WIDTH: 17.2 % — ABNORMAL HIGH (ref 12.0–15.0)
WBC ADJUSTED: 12.5 10*9/L — ABNORMAL HIGH (ref 4.5–11.0)

## 2020-01-21 LAB — BASIC METABOLIC PANEL
ANION GAP: 12 mmol/L (ref 5–14)
BLOOD UREA NITROGEN: 22 mg/dL (ref 9–23)
BUN / CREAT RATIO: 23
CALCIUM: 7.1 mg/dL — ABNORMAL LOW (ref 8.7–10.4)
CHLORIDE: 103 mmol/L (ref 98–107)
CO2: 23 mmol/L (ref 20.0–31.0)
CREATININE: 0.96 mg/dL
EGFR CKD-EPI AA MALE: >90 mL/min/{1.73_m2} (ref >=60)
EGFR CKD-EPI NON-AA MALE: >90 mL/min/{1.73_m2} (ref >=60)
GLUCOSE RANDOM: 118 mg/dL (ref 70–179)
POTASSIUM: 3.5 mmol/L (ref 3.4–4.5)
SODIUM: 138 mmol/L (ref 135–145)

## 2020-01-21 LAB — MAGNESIUM: MAGNESIUM: 2 mg/dL (ref 1.6–2.6)

## 2020-01-21 LAB — PINK EXTRA TUBE

## 2020-01-21 LAB — PHOSPHORUS: PHOSPHORUS: 6.4 mg/dL — ABNORMAL HIGH (ref 2.4–5.1)

## 2020-01-21 MED ORDER — DEXAMETHASONE 4 MG TABLET
ORAL_TABLET | Freq: Every day | ORAL | 0 refills | 3.00000 days | Status: CP
Start: 2020-01-21 — End: 2020-01-24

## 2020-01-21 MED ORDER — ACETAMINOPHEN 500 MG TABLET
ORAL_TABLET | Freq: Three times a day (TID) | ORAL | 0 refills | 5 days | PRN
Start: 2020-01-21 — End: ?

## 2020-01-21 MED ADMIN — dexAMETHasone (DECADRON) tablet 4 mg: 4 mg | ORAL | @ 14:00:00 | Stop: 2020-01-22

## 2020-01-21 MED ADMIN — enoxaparin (LOVENOX) syringe 40 mg: 40 mg | SUBCUTANEOUS | @ 14:00:00 | Stop: 2020-02-02

## 2020-01-21 MED ADMIN — enoxaparin (LOVENOX) syringe 40 mg: 40 mg | SUBCUTANEOUS | @ 02:00:00 | Stop: 2020-02-02

## 2020-01-21 MED ADMIN — levothyroxine (SYNTHROID) tablet 375 mcg: 375 ug | ORAL | @ 14:00:00 | Stop: 2020-01-25

## 2020-01-21 MED ADMIN — DULoxetine (CYMBALTA) DR capsule 30 mg: 30 mg | ORAL | @ 02:00:00 | Stop: 2020-02-02

## 2020-01-21 MED ADMIN — insulin regular (HumuLIN,NovoLIN) injection 0-12 Units: 0-12 [IU] | SUBCUTANEOUS | @ 02:00:00 | Stop: 2020-01-28

## 2020-01-21 MED ADMIN — ondansetron (ZOFRAN-ODT) disintegrating tablet 4 mg: 4 mg | ORAL | @ 01:00:00 | Stop: 2020-01-22

## 2020-01-21 MED ADMIN — potassium chloride (KLOR-CON) CR tablet 20 mEq: 20 meq | ORAL | @ 12:00:00 | Stop: 2020-01-21

## 2020-01-21 MED ADMIN — DULoxetine (CYMBALTA) DR capsule 30 mg: 30 mg | ORAL | @ 14:00:00 | Stop: 2020-02-02

## 2020-01-21 MED ADMIN — oxyCODONE (ROXICODONE) immediate release tablet 5 mg: 5 mg | ORAL | @ 02:00:00 | Stop: 2020-01-27

## 2020-01-21 MED ADMIN — dexAMETHasone (DECADRON) tablet 4 mg: 4 mg | ORAL | @ 02:00:00 | Stop: 2020-01-22

## 2020-01-21 NOTE — Unmapped (Signed)
Pt A&Ox4. O2 maintained on room air. Regular diet; tolerating well. Pt had emesis at beginning of shift. PRN Zofran was administered. Pt complained of abdominal tightness, team was notified and came bedside to examine pt. Q4 VS remain in place. Pt ambulates independently. Continent of bowel and bladder; LBM 1/7. PRN oxycodone was administered x1 this shift for pain. No PIV. ACHS, pt needed 2 units of coverage this shift. Pt's bed is locked, in lowest position, and call light is within reach.      Problem: Adult Inpatient Plan of Care  Goal: Plan of Care Review  Outcome: Progressing  Goal: Patient-Specific Goal (Individualized)  Outcome: Progressing  Goal: Absence of Hospital-Acquired Illness or Injury  Outcome: Progressing  Intervention: Identify and Manage Fall Risk  Recent Flowsheet Documentation  Taken 01/21/2020 0200 by Algis Liming, RN  Safety Interventions:   fall reduction program maintained   lighting adjusted for tasks/safety   low bed   nonskid shoes/slippers when out of bed  Taken 01/21/2020 0000 by Algis Liming, RN  Safety Interventions:   fall reduction program maintained   lighting adjusted for tasks/safety   low bed   nonskid shoes/slippers when out of bed  Taken 01/20/2020 2200 by Algis Liming, RN  Safety Interventions:   fall reduction program maintained   lighting adjusted for tasks/safety   low bed   nonskid shoes/slippers when out of bed  Taken 01/20/2020 2000 by Algis Liming, RN  Safety Interventions:   fall reduction program maintained   lighting adjusted for tasks/safety   low bed   nonskid shoes/slippers when out of bed  Goal: Optimal Comfort and Wellbeing  Outcome: Progressing  Goal: Readiness for Transition of Care  Outcome: Progressing  Goal: Rounds/Family Conference  Outcome: Progressing     Problem: Impaired Wound Healing  Goal: Optimal Wound Healing  Outcome: Progressing

## 2020-01-21 NOTE — Unmapped (Signed)
Care Management  Initial Transition Planning Assessment              General  Care Manager assessed the patient by : In person interview with patient,Medical record review,Discussion with Clinical Care team  Orientation Level: Oriented X4  Functional level prior to admission: Independent  Reason for referral: Discharge Planning    Contact/Decision Maker  Extended Emergency Contact Information  Primary Emergency Contact: Huntley,Tammy (Poling)   [place]  Mobile Phone: [phone]  Relation: Sister and Product manager  Secondary Emergency Contact: Trotti,Jay   [place]  Home Phone: [phone]  Relation: Brother    Armed forces operational officer Next of Kin / Guardian / POA / Advance Directives     HCDM (patient stated preference): Huntley, Tammy - Sister - [phone]    Advance Directive (Medical Treatment)  Does patient have an advance directive covering medical treatment?: Patient has advance directive covering medical treatment, copy not in chart. 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  Past Medical History:   has a past medical history of Cancer (CMS-HCC), Disease of thyroid gland, Hypothyroidism, and Skin cancer.  Past Surgical History:   has a past surgical history that includes pr removal nodes, neck,cerv cmplt (Left, 12/07/2012); pr removal nodes, neck,cerv mod rad (Right, 03/29/2013); pr lap, ventral hernia repair,reducible (N/A, 04/07/2014); pr repair recurr incis hernia,reduc (N/A, 01/03/2015); pr implant mesh hernia repair/debridement closure (N/A, 01/03/2015); pr repair recurr incis hernia,strang (N/A, 06/21/2016); pr colonoscopy w/biopsy single/multiple (N/A, 01/09/2018); pr sigmoidoscopy,fine needl bx,us guided (N/A, 01/09/2018); pr colonoscopy w/biopsy single/multiple (N/A, 08/24/2019); and pr colsc flx with directed submucosal njx any sbst (N/A, 08/24/2019).   Previous admit date: 11/24/2018    Primary Insurance- Payor: BCBS / Plan: BCBS BLUE OPTIONS/PPO/ADV ([place] ONLY) / Product Type: *No Product type* /   Secondary Insurance ??? None  Prescription Coverage ??? [employer]  [facility]erred [facility] - [phone] [facility] [place], [place] - [address]  [facility]    Transportation home: Private vehicle          Social Determinants of Health  Social Determinants of Health     Tobacco Use: Low Risk    ??? Smoking Tobacco Use: Never Smoker   ???  Smokeless Tobacco Use: Never Used   Alcohol Use: Not on file   Financial Resource Strain: Low Risk    ??? Difficulty of Paying Living Expenses: Not hard at all   Food Insecurity: No Food Insecurity   ??? Worried About Programme researcher, broadcasting/film/video in the Last Year: Never true   ??? Ran Out of Food in the Last Year: Never true   Transportation Needs: No Transportation Needs   ??? Lack of Transportation (Medical): No   ??? Lack of Transportation (Non-Medical): No   Physical Activity: Not on file   Stress: Not on file   Social Connections: Not on file   Intimate Partner Violence: Not on file   Depression: Not on file   Housing/Utilities: Low Risk    ??? Within the past 12 months, have you ever stayed: outside, in a car, in a tent, in an overnight shelter, or temporarily in someone else's home (i.e. couch-surfing)?: No   ??? Are you worried about losing your housing?: No   ??? Within the past 12 months, have you been unable to get utilities (heat, electricity) when it was really needed?: No   Substance Use: Not on file   Health Literacy: Not on file       Discharge Needs Assessment  Concerns to be Addressed: no discharge needs identified    Clinical Risk Factors: New Diagnosis,Principal Diagnosis: Cancer, Stroke, COPD, Heart Failure, AMI, Pneumonia, Joint Replacment,Multiple Diagnoses (Chronic)    Barriers to taking medications: No    Prior overnight hospital stay or ED visit in last 90 days: No    Readmission Within the Last 30 Days: no previous admission in last 30 days         Anticipated Changes Related to Illness: none    Equipment Needed After Discharge: none    Discharge Facility/Level of Care Needs:      Readmission  Risk of Unplanned Readmission Score: UNPLANNED READMISSION SCORE: 11%  Predictive Model Details          11% (Low)  Factor Value    Calculated 01/20/2020 16:04 20% Number of active Rx orders 31    [place] Risk of Unplanned Readmission Model 18% Active NSAID Rx order present     10% Diagnosis of cancer present     9% ECG/EKG order present in last 6 months     9% Latest calcium low (7.5 mg/dL)     6% Imaging order present in last 6 months     6% Phosphorous result present     5% Number of ED visits in last six months 1     4% Active anticoagulant Rx order present     4% Active corticosteroid Rx order present     3% Age 45     2% Charlson Comorbidity Index 2     2% Future appointment scheduled     1% Current length of stay 1.395 days      Readmitted Within the Last 30 Days? (No if blank)   Patient at risk for readmission?: No    Discharge Plan  Screen findings are: Care Manager reviewed the plan of the patient's care with the Multidisciplinary Team. No discharge planning needs identified at this time. Care Manager will continue to manage plan and monitor patient's progress with the team.    Expected Discharge Date: 01/21/2020    Quality data for continuing care services shared with patient and/or representative?: N/A  Patient and/or family were provided with choice of facilities / services that  are available and appropriate to meet post hospital care needs?: N/A       Initial Assessment complete?: Yes

## 2020-01-21 NOTE — Unmapped (Signed)
No acute events. VS stable, afebrile. Voiding without difficulty.  NG clamp trial, suction turned back on at noon, out. NG tube discontinued, started on clear liquid diet. +flatus, had BM this afternoon.  PIV placed by VAT team this am infiltrated this afternoon, unable to place new PIV, VAT team RNs x2 attempted multiple sticks. Notified [facility], plan to switch Decadron to PO, and advance to regular diet.  Went for radiation this afternoon.

## 2020-01-21 NOTE — Unmapped (Signed)
Order was placed for PIV by Venous Access Team (VAT).  Patient was assessed at bedside for placement of a PIV. Mask and eye protection were utilized.  Unable to obtain access x2 sticks by this RN. 2nd VAT RN called to bedside and stuck patient once. Veins dive at times and once IV enters the vein it blows when flushed. Unable to re-establish a new IV site successfully x3 sticks. Spoke with RN who feels comfortable speaking with team. Primary RN is encouraged to call VAT to answer any questions that she or provider may have. Current suggestion is to switch meds to PO and maybe reassess tomorrow if further IV access is needed. Patient stated that switching meds to PO was mentioned as a potential soon. This will allow patient a rest from being stuck more times today. If decision made to restrart PIV at any time or patient condition changes, please do not hesitate to place another PIV order.   Workup / Procedure Time:  45 minutes      The primary RN was notified.       Thank you,     Emily Filbert RN Venous Access Team

## 2020-01-21 NOTE — Unmapped (Signed)
Discharge Summary    Admit date: 01/18/2020    Discharge date and time: 01/21/20    Discharge to:  Home    Discharge Service: [facility])    Discharge Attending Physician: Dr. Marcha Dutton    Discharge  Diagnoses: bowel obstruction    Secondary Diagnosis: Active Problems:    * No active hospital problems. *  Resolved Problems:    * No resolved hospital problems. *      OR Procedures:  none     Discharge Day Services: The patient was seen and examined by the Trauma Surgery team on the day of discharge. Vital signs and laboratory values were stable and within normal limits.Discharge plan was discussed, instructions were given and all questions answered.  Keep f/u with rad onc.  May f/u with general surgery as needed.        Subjective   Pain Controlled.    Objective   Patient Vitals for the past 8 hrs:   BP Temp Temp src Pulse Resp SpO2   01/21/20 0904 136/97 36.6 ??C (97.9 ??F) Oral 87 17 99 %     I/O this shift:  In: 600 [P.O.:600]  Out: 800 [Urine:800]      Hospital Course:  Kenneth Fitzgerald 45 y.o. 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      # AKI on admission: likely due to nausea, poor PO intake and vomiting.  Creatinine on admission 1.34.  MIVF administered.  At discharge, creatinine was WNL.      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  Radiation oncology was also consulted to treat while inpatient if necessary.   Pt will discharge and continue regular treatment as outpatient.    Discussed with rad onc; would like patient to continue dexamethasone 4mg  BID for now (can give IV until patient is tolerating PO) & then will provide a taper.    #SBO: NGT placed and clamp trial performed 1/6 removed on 1/6, pt tolerated well.     His diet was advanced and by discharge he was tolerating a regular diet. He was voiding adequately, ambulating independently, and his pain was controlled wth PO pain medications. He was examined by the Trauma Surgery team on the day of discharge and was deemed suitable for discharge home. He will be discharged on HD #3 in good condition.    I spent greater than 30 minutes completing this discharge.       Condition at Discharge: Improved  Discharge Medications:      Medication List      START taking these medications    ??? acetaminophen 500 MG tablet; Commonly known as: TYLENOL; Take 2 tablets   (1,000 mg total) by mouth every eight (8) hours as needed for pain.     CHANGE how you take these medications    ??? dexAMETHasone 4 MG tablet; Commonly known as: DECADRON; Take 1 tablet (4   mg total) by mouth daily for 3 days.; What changed: medication strength,   how much to take, when to take this     CONTINUE taking these medications    ???  ALLEGRA ALLERGY 180 MG tablet; Generic drug: fexofenadine  ??? blood sugar diagnostic Strp; Dispense 100 blood glucose test strips, ok   to sub any brand preferred by insurance/patient, use 3x/day; dispense   whatever brand matches with meter.  ??? blood-glucose meter kit; Use as instructed; dispense 1 meter, whatever   is preferred by insurance  ??? BRAFTOVI 75 mg capsule; Generic drug: encorafenib; Take 6 capsules (450   mg total) by mouth daily.  ??? calcitrioL 0.25 MCG capsule; Commonly known as: ROCALTROL; Take 1   capsule (0.25 mcg total) by mouth daily.  ??? calcium carbonate 650 mg calcium (1,625 mg) tablet  ??? DULoxetine 30 MG capsule; Commonly known as: CYMBALTA; Take 1 capsule   (30 mg total) by mouth Two (2) times a day.  ??? folic acid 1 MG tablet; Commonly known as: FOLVITE; Take 1 tablet (1 mg   total) by mouth daily.  ??? ibuprofen 200 MG tablet; Commonly known as: ADVIL,MOTRIN  ??? lancets Misc; Dispense 100 lancets, ok to sub any brand preferred by   insurance/patient, use 3x/day  ??? [name] 15 mg tablet; Generic drug: binimetinib; Take 3 tablets (45 mg   total) by mouth Two (2) times a day.  ??? melatonin 3 mg Tab; Take 1 tablet (3 mg total) by mouth every evening.  ??? memantine 10 MG tablet; Commonly known as: NAMENDA  ??? metFORMIN 500 MG tablet; Commonly known as: GLUCOPHAGE; Take 1 tablet   (500 mg total) by mouth 2 (two) times a day with meals.  ??? ondansetron 8 MG tablet; Commonly known as: ZOFRAN; Take 1 tablet (8 mg   total) by mouth every eight (8) hours as needed for nausea.  ??? sulfaSALAzine 500 mg tablet; Commonly known as: AZULFIDINE; Take 2   tablets (1000mg ) by mouth twice daily for 7 days, then take 4 tablets   (2000mg ) twice daily thereafter.  ??? SYNTHROID 125 MCG tablet; Generic drug: levothyroxine         Other Instructions     Discharge instructions      nstructions    DIET: You may advance your diet to regular, as tolerated.   Can take OTC laxative or stool softener like colace or miralax as needed for any constipation.      ACTIVITY: Increase activity as tolerated    PAIN MEDICATION: Do not drive or drink alcohol while taking prescription pain medication. Take your prescribed pain medication only as needed. You may decrease the amount of pain medication as tolerated, and take Tylenol or a Motrin product as directed on the label.?? Pain medication can cause constipation. To avoid this, you should take an over-the-counter stool softener, such as docusate 100mg  1-2 times daily.     Effective July 13, 2015, the [place]io [place] signed a [address] that works to address the opioid crisis our state is facing.  It is called the STOP Law, for Strengthen Opioid Misuse Prevention.       As part of this law, we are limited to prescribing no more than a seven-day supply of opioid pain medicine for our surgery patients initally.  (This law also limits prescriptions for patients who have not had surgery to a five-day supply.)     Because opioid prescriptions cannot be renewed electronically or over the phone, a paper prescription must be presented to the pharmacy.  Therefore, a patient must contact our office at least 2-3 business days before you anticipate you will run out of pain medication so that we can  develop a plan for you.  The best number to call is [phone].  The on-call provider will NOT be able to renew your prescription.     The STOP Act also monitors the amount of controlled substances that each patient has received from any and all providers.  Prescriptions for each patient will be monitored by the [place] Controlled Substances Reporting System in accordance with the law.  Thank you for working with [place] so we can provide good pain control in the safest way possible while you recover.     Please note that prescription pain medication refills will not be called into your pharmacy. If you feel that you are needing more pain medication, you will need to be seen in clinic or the Emergency Department for further evaluation to ensure you are not experiencing a complication. Please take note of how many pills you have left in your bottle, and if you are starting to run low and feel that you will need more for adequate pain control, to call the clinic as soon as possible to schedule an appointment, as our clinics are very busy, and we cannot guarantee same week appointments.    The Trauma Surgery office number is [phone]. For emergencies at night or on the week-end, please call [phone]) and ask for the surgery resident on call.    WHEN TO CALL THE SURGEON'S OFFICE:  1. Signs and symptoms of strangulated hernia that would require immediate attention or emergency care include: nausea, vomiting, fever, severe pain, hernia bulge that turns red or purple, or inability to move bowels or pass gas.  Pt verbalized understanding.   2. Temperature greater than 101.5  3. Uncontrolled nausea or vomiting.  4. Pain that is not controlled by your pain medication    May take Motrin 600mg  with breakfast, lunch, dinner and before bed time to help with pain.     Standard Patient Notification Script  We care deeply about how our patients' pain is managed after surgery.  During the month after you have surgery, you may receive a call from [place] asking about your postsurgical pain and about how you managed that pain.  The survey will take approximately 5 to 10 minutes and will help [place] to provide better care to our patients.  Thank you in advance for your participation and please answer any calls coming from the [facility] or [place] area!    Please follow-up with your primary care physician (PCP) to discuss the following incidental findings noted on your imaging:  - Multiple peritoneal implants and prominent abdominal and pelvic lymph nodes, consistent with patient's known metastatic melanoma and better appreciated on prior MRI. Lymph nodes are less prominent than on prior imaging.  - Extensive colonic diverticulosis without evidence of diverticulitis  - Lytic lesion in the T12 vertebral body, unchanged from prior imaging. Sclerotic lesion in the L2 vertebral body is not well visualized on current imaging.   - Small left-sided fat-containing inguinal hernia.     Take Dexamethasone 4mg  once daily for 3 days then stop.    Follow Up  [facility] and Trauma Surgery  [address]  [facility] - First Floor  [place], [place]  [address]    Contact Information for Nurses:  [phone]: Waynetta Sandy    If need to change your appointment:  [phone]    Fax: [phone]  No follow-up required. If having concerns please call the clinic to schedule an appointment.  Please note, if you arrive more than 20 minutes late, you will need to reschedule your appointment      *For support and information on substance abuse you may call the [facility]. It is free, confidential 24/7, 365 day-a-year treatment referral and information service (in [place] and [place]) for individuals and families facing mental and/or substance use disorders. 1-800-662-HELP (4357).     Keep schedule f/u with your Oncology team        Labs and Other Follow-ups after Discharge:  Follow Up instructions and Outpatient Referrals     Discharge instructions          Future Appointments:  Appointments which have been scheduled for you    Feb 10, 2020  UGI ENDOSCOPY; WITH BIOPSY, SINGLE OR MULTIPLE with Leland Her, MD  GI PERIOP [facility] REGION) [address]  [place] [address]  [phone]   Apr 19, 2020  9:40 AM  (Arrive by 9:25 AM)  CT CHEST W CONTRAST with IC CT RM 1  RAD [facility]) [address]  [place] [address]  [phone]   On appt date:  Drink lots of water 24 hrs  Bring recent lab work  Take meds as usual  [employer] fast streamer of current meds  Bring snack if diabetic    On appt date do not:  Consume anything 2 hrs prior to your appointment    Let us know if pt:  Allergic to contrast dyes  Diabetic  Pregnant or nursing  Claustrophobic    (Title:CTWCNTRST)     Apr 19, 2020 10:00 AM  (Arrive by 9:45 AM)  MRI Combo 135 w contrast -UN with IC MRI RM 2  IMG MRI IMAGING CENTER [facility]) [address]  [place] [address]  [phone]   On appt date:  Bring recent lab work  Bring documentation of any metal object implants  Take meds as usual  Check w/physician if diabetic  You will be asked to change into a gown for your safety    On appt date do not:  Consume anything 2 hrs  Wear metallic items including jewelry (we are not responsible for lost items)    Let us know if pt:  Claustrophobic  Metal object implant  Pregnant  Prescribed a sedative  On dialysis  Allergic to MRI dye/contrast  Kidney Failure    (Title:MRIWCNTRST)     Apr 19, 2020  1:00 PM  (Arrive by 12:30 PM)  RETURN ACTIVE [place] with [name], MD  [facility] ONCOLOGY MULTIDISCIPLINARY 2ND FLR CANCER HOSP [facility] REGION) [address]  [place] [address]  [phone]      May 03, 2020  2:00 PM  (Arrive by 1:45 PM)  RETURN  ENDOCRINE with Tripuraneni Jackquline Denmark, MD  [facility] DIABETES AND ENDOCRINOLOGY [place] [facility]) [address]  [place] [address]  [phone]      May 08, 2020 10:00 AM  (Arrive by 9:45 AM)  RETURN VIDEO - OTHER with Luanne Bras, MD  [facility] GI MEDICINE [place]  [facility] REGION) [address]  [place] [address]  [phone]

## 2020-01-22 DIAGNOSIS — N179 Acute kidney failure, unspecified: Principal | ICD-10-CM

## 2020-01-22 DIAGNOSIS — C439 Malignant melanoma of skin, unspecified: Principal | ICD-10-CM

## 2020-01-22 DIAGNOSIS — C73 Malignant neoplasm of thyroid gland: Principal | ICD-10-CM

## 2020-01-22 DIAGNOSIS — E440 Moderate protein-calorie malnutrition: Principal | ICD-10-CM

## 2020-01-22 DIAGNOSIS — Z6841 Body Mass Index (BMI) 40.0 and over, adult: Principal | ICD-10-CM

## 2020-01-22 DIAGNOSIS — Z800 Family history of malignant neoplasm of digestive organs: Principal | ICD-10-CM

## 2020-01-22 DIAGNOSIS — E119 Type 2 diabetes mellitus without complications: Principal | ICD-10-CM

## 2020-01-22 DIAGNOSIS — I10 Essential (primary) hypertension: Principal | ICD-10-CM

## 2020-01-22 DIAGNOSIS — C719 Malignant neoplasm of brain, unspecified: Principal | ICD-10-CM

## 2020-01-22 DIAGNOSIS — Z9049 Acquired absence of other specified parts of digestive tract: Principal | ICD-10-CM

## 2020-01-22 DIAGNOSIS — Z923 Personal history of irradiation: Principal | ICD-10-CM

## 2020-01-22 DIAGNOSIS — C787 Secondary malignant neoplasm of liver and intrahepatic bile duct: Principal | ICD-10-CM

## 2020-01-22 DIAGNOSIS — C786 Secondary malignant neoplasm of retroperitoneum and peritoneum: Principal | ICD-10-CM

## 2020-01-22 DIAGNOSIS — Z8042 Family history of malignant neoplasm of prostate: Principal | ICD-10-CM

## 2020-01-22 DIAGNOSIS — K430 Incisional hernia with obstruction, without gangrene: Principal | ICD-10-CM

## 2020-01-22 DIAGNOSIS — M109 Gout, unspecified: Principal | ICD-10-CM

## 2020-01-22 DIAGNOSIS — K5010 Crohn's disease of large intestine without complications: Principal | ICD-10-CM

## 2020-01-22 DIAGNOSIS — K56609 Unspecified intestinal obstruction, unspecified as to partial versus complete obstruction: Principal | ICD-10-CM

## 2020-01-22 DIAGNOSIS — R188 Other ascites: Principal | ICD-10-CM

## 2020-01-22 DIAGNOSIS — Z20822 Contact with and (suspected) exposure to covid-19: Principal | ICD-10-CM

## 2020-01-22 DIAGNOSIS — E861 Hypovolemia: Principal | ICD-10-CM

## 2020-01-22 DIAGNOSIS — E890 Postprocedural hypothyroidism: Principal | ICD-10-CM

## 2020-01-22 DIAGNOSIS — F330 Major depressive disorder, recurrent, mild: Principal | ICD-10-CM

## 2020-01-22 LAB — BASIC METABOLIC PANEL
ANION GAP: 11 mmol/L (ref 5–14)
BLOOD UREA NITROGEN: 21 mg/dL (ref 9–23)
BUN / CREAT RATIO: 17
CALCIUM: 7.3 mg/dL — ABNORMAL LOW (ref 8.7–10.4)
CHLORIDE: 102 mmol/L (ref 98–107)
CO2: 25 mmol/L (ref 20.0–31.0)
CREATININE: 1.26 mg/dL — ABNORMAL HIGH
EGFR CKD-EPI AA MALE: 80 mL/min/{1.73_m2} (ref >=60)
EGFR CKD-EPI NON-AA MALE: 69 mL/min/{1.73_m2} (ref >=60)
GLUCOSE RANDOM: 127 mg/dL (ref 70–179)
POTASSIUM: 3.4 mmol/L (ref 3.4–4.5)
SODIUM: 138 mmol/L (ref 135–145)

## 2020-01-22 LAB — CBC W/ AUTO DIFF
BASOPHILS ABSOLUTE COUNT: 0.1 10*9/L (ref 0.0–0.1)
BASOPHILS RELATIVE PERCENT: 0.4 %
EOSINOPHILS ABSOLUTE COUNT: 0.1 10*9/L (ref 0.0–0.4)
EOSINOPHILS RELATIVE PERCENT: 0.6 %
HEMATOCRIT: 44.5 % (ref 41.0–53.0)
HEMOGLOBIN: 14 g/dL (ref 13.5–17.5)
LARGE UNSTAINED CELLS: 1 % (ref 0–4)
LYMPHOCYTES ABSOLUTE COUNT: 0.6 10*9/L — ABNORMAL LOW (ref 1.5–5.0)
LYMPHOCYTES RELATIVE PERCENT: 4.4 %
MEAN CORPUSCULAR HEMOGLOBIN CONC: 31.4 g/dL (ref 31.0–37.0)
MEAN CORPUSCULAR HEMOGLOBIN: 25 pg — ABNORMAL LOW (ref 26.0–34.0)
MEAN CORPUSCULAR VOLUME: 79.5 fL — ABNORMAL LOW (ref 80.0–100.0)
MEAN PLATELET VOLUME: 10.2 fL — ABNORMAL HIGH (ref 7.0–10.0)
MONOCYTES ABSOLUTE COUNT: 0.6 10*9/L (ref 0.2–0.8)
MONOCYTES RELATIVE PERCENT: 4.5 %
NEUTROPHILS ABSOLUTE COUNT: 11.7 10*9/L — ABNORMAL HIGH (ref 2.0–7.5)
NEUTROPHILS RELATIVE PERCENT: 89.7 %
PLATELET COUNT: 345 10*9/L (ref 150–440)
RED BLOOD CELL COUNT: 5.6 10*12/L (ref 4.50–5.90)
RED CELL DISTRIBUTION WIDTH: 17.2 % — ABNORMAL HIGH (ref 12.0–15.0)
WBC ADJUSTED: 13 10*9/L — ABNORMAL HIGH (ref 4.5–11.0)

## 2020-01-22 LAB — PHOSPHORUS: PHOSPHORUS: 7.5 mg/dL — ABNORMAL HIGH (ref 2.4–5.1)

## 2020-01-22 LAB — MAGNESIUM: MAGNESIUM: 2 mg/dL (ref 1.6–2.6)

## 2020-01-22 MED ADMIN — lidocaine (XYLOCAINE) 2% viscous mucosal solution: 15 mL | NASAL | @ 23:00:00 | Stop: 2020-01-22

## 2020-01-22 MED ADMIN — diphenhydrAMINE (BENADRYL) oral elixir: 12.5 mg | ORAL | @ 04:00:00 | Stop: 2020-01-21

## 2020-01-22 MED ADMIN — levothyroxine (SYNTHROID) tablet 250 mcg: 250 ug | ORAL | @ 14:00:00 | Stop: 2020-01-25

## 2020-01-22 MED ADMIN — lactated Ringers infusion: 100 mL/h | INTRAVENOUS | @ 18:00:00 | Stop: 2020-01-23

## 2020-01-22 MED ADMIN — memantine (NAMENDA) tablet 10 mg: 10 mg | ORAL | @ 14:00:00 | Stop: 2020-01-22

## 2020-01-22 MED ADMIN — dexAMETHasone (DECADRON) tablet 4 mg: 4 mg | ORAL | @ 02:00:00 | Stop: 2020-01-22

## 2020-01-22 MED ADMIN — promethazine (PHENERGAN) 12.5 mg in sodium chloride (NS) 0.9 % 25 mL infusion: 12.5 mg | INTRAVENOUS | @ 21:00:00 | Stop: 2020-02-02

## 2020-01-22 MED ADMIN — ondansetron (ZOFRAN) injection 4 mg: 4 mg | INTRAVENOUS | @ 20:00:00 | Stop: 2020-01-31

## 2020-01-22 MED ADMIN — ondansetron (ZOFRAN-ODT) disintegrating tablet 4 mg: 4 mg | ORAL | Stop: 2020-01-22

## 2020-01-22 MED ADMIN — DULoxetine (CYMBALTA) DR capsule 30 mg: 30 mg | ORAL | @ 14:00:00 | Stop: 2020-02-02

## 2020-01-22 MED ADMIN — docusate sodium (COLACE) capsule 100 mg: 100 mg | ORAL | @ 14:00:00 | Stop: 2020-01-24

## 2020-01-22 MED ADMIN — sodium chloride 0.9% (NS) bolus 1,000 mL: 1000 mL | INTRAVENOUS | @ 15:00:00 | Stop: 2020-01-22

## 2020-01-22 MED ADMIN — oxyCODONE (ROXICODONE) immediate release tablet 5 mg: 5 mg | ORAL | @ 20:00:00 | Stop: 2020-01-27

## 2020-01-22 MED ADMIN — DULoxetine (CYMBALTA) DR capsule 30 mg: 30 mg | ORAL | @ 02:00:00 | Stop: 2020-02-02

## 2020-01-22 MED ADMIN — ondansetron (ZOFRAN-ODT) disintegrating tablet 4 mg: 4 mg | ORAL | @ 17:00:00 | Stop: 2020-01-22

## 2020-01-22 MED ADMIN — potassium chloride (KLOR-CON) CR tablet 20 mEq: 20 meq | ORAL | @ 14:00:00 | Stop: 2020-01-22

## 2020-01-22 MED ADMIN — enoxaparin (LOVENOX) syringe 40 mg: 40 mg | SUBCUTANEOUS | @ 14:00:00 | Stop: 2020-02-02

## 2020-01-22 MED ADMIN — enoxaparin (LOVENOX) syringe 40 mg: 40 mg | SUBCUTANEOUS | @ 02:00:00 | Stop: 2020-02-02

## 2020-01-22 NOTE — Unmapped (Signed)
[place] TRAUMA, ACUTE CARE, and GENERAL SURGERY   - Surgery Daily Progress Note -  01/22/2020       Admit Date: 01/18/2020, Fitzgerald Day: 5  Fitzgerald Service: [facility])      Assessment     Kenneth Fitzgerald 45 y.o. 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   - Voiding spontaneously  - Cr 1.26 from 1.09, likely pre renal d/t decreased PO intake and diarrhea.  Bolus today as above.      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   - Dexamethasone per onc, weaning down to 4mg  daily ending 1/10    ID:  - Afebrile.  - WBC- 13 from 12.5    PPx:   - Heparin     Dispo: Floor status  PT/OT: not indicated  Barriers to discharge: Nausea  CM/SW assisting in discharge planning.     Contact: [facility]-5  Amy London Pepper  [phone]      Subjective:   Nausea overnight and this morning with emesis x2, still passing gas and having BMs.  Denies any new abdominal pain.      Objective:     Vital signs in last 24 hours:  Temp:  [36.4 ??C (97.5 ??F)-37 ??C (98.6 ??F)] 36.6 ??C (97.9 ??F)  Heart Rate:  [92-110] 94  Resp:  [16-18] 16  BP: (116-139)/(73-92) 139/90  MAP (mmHg):  [92-109] 108  SpO2:  [94 %-100 %] 98 %    Intake/Output last 24 hours:  I/O last 3 completed shifts:  In: 1800 [P.O.:1800]  Out: 2425 [Urine:1925; Emesis/NG output:500]    Physical Exam:  -General:  Appropriate, comfortable and in no apparent distress.   -Neurological: Alert and oriented x3. Moves all 4 extremities spontaneously.   -Pulmonary: Normal work of breathing.  No accessory muscle use.  -Abdomen: Soft, mildly distended, non tender to palpation, midline scarring, ventral hernia reduced, mild RLQ tenderness to palpation  -Genitourinary: Voiding spontaneously.   -Extremities: Warm, well perfused, normal skin turgor.    Data Review:    All lab results last 24 hours:    Recent Results (from the past 24 hour(s))   POCT Glucose    Collection Time: 01/21/20  4:26 PM   Result Value Ref Range    Glucose, POC 113 70 - 179 mg/dL   POCT Glucose    Collection Time: 01/21/20  8:44 PM   Result Value Ref Range    Glucose, POC 136 70 - 179 mg/dL   Basic Metabolic Panel    Collection Time: 01/22/20  4:29 AM   Result Value Ref Range    Sodium 138 135 - 145 mmol/L    Potassium 3.4 3.4 - 4.5 mmol/L    Chloride 102 98 - 107 mmol/L    CO2 25.0 20.0 - 31.0 mmol/L    Anion Gap 11 5 - 14 mmol/L    BUN 21 9 - 23 mg/dL    Creatinine 4.54 (H) 0.60 - 1.10 mg/dL    BUN/Creatinine Ratio 17     EGFR CKD-EPI Non-African American, Male 2 >=60 mL/min/1.64m2    EGFR CKD-EPI African American, Male 31 >=60 mL/min/1.26m2    Glucose 127 70 - 179 mg/dL    Calcium 7.3 (L) 8.7 - 10.4 mg/dL   Magnesium Level    Collection Time: 01/22/20  4:29 AM   Result Value Ref Range    Magnesium 2.0 1.6 - 2.6 mg/dL   Phosphorus Level    Collection Time: 01/22/20  4:29 AM   Result Value Ref Range    Phosphorus 7.5 (H) 2.4 - 5.1 mg/dL   CBC w/ Differential    Collection Time: 01/22/20  4:29 AM   Result Value Ref Range    WBC 13.0 (H) 4.5 - 11.0 10*9/L    RBC 5.60 4.50 - 5.90 10*12/L    HGB 14.0 13.5 - 17.5 g/dL    HCT 09.8 11.9 - 14.7 %    MCV 79.5 (L) 80.0 - 100.0 fL    MCH 25.0 (L) 26.0 - 34.0 pg    MCHC 31.4 31.0 - 37.0 g/dL    RDW 82.9 (H) 56.2 - 15.0 %    MPV 10.2 (H) 7.0 - 10.0 fL    Platelet 345 150 - 440 10*9/L    Neutrophils % 89.7 %    Lymphocytes % 4.4 %    Monocytes % 4.5 %    Eosinophils % 0.6 %    Basophils % 0.4 %    Neutrophil Left Shift 1+ (A) Not Present    Absolute Neutrophils 11.7 (H) 2.0 - 7.5 10*9/L    Absolute Lymphocytes 0.6 (L) 1.5 - 5.0 10*9/L    Absolute Monocytes 0.6 0.2 - 0.8 10*9/L    Absolute Eosinophils 0.1 0.0 - 0.4 10*9/L    Absolute Basophils 0.1 0.0 - 0.1 10*9/L    Large Unstained Cells 1 0 - 4 %    Microcytosis Moderate (A) Not Present    Anisocytosis Slight (A) Not Present    Hypochromasia Slight (A) Not Present   POCT Glucose    Collection Time: 01/22/20  6:44 AM   Result Value Ref Range    Glucose, POC 130 70 - 179 mg/dL         Imaging: KUB pending  Attending Attestation  I saw and evaluated the patient, participating in the key portions of the service. I reviewed the resident???s note. I agree with the resident???s findings and plan.  Celso Amy MD

## 2020-01-22 NOTE — Unmapped (Signed)
[name] TRAUMA, ACUTE CARE, and GENERAL SURGERY   - Surgery Daily Progress Note -  01/22/2020       Admit Date: 01/18/2020, Hospital Day: 5  Hospital Service: [facility]urgTrauma [facility])      Assessment     Kenneth Fitzgerald [facility] 45 y.o. 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   - Dexamethasone per onc    ID:  - Afebrile.  - WBC- 12.5    PPx:   - Heparin     Dispo: Floor status  PT/OT: not indicated  Barriers to discharge: awaiting return of bowel function  CM/SW assisting in discharge planning.     Contact: [facility]-5  Kenneth Fitzgerald Kenneth Fitzgerald  [phone]      Subjective:   No acute events overnight. Multiple BMs ON but still with some mild nausea.      Objective:     Vital signs in last 24 hours:  Temp:  [36.4 ??C (97.5 ??F)-37 ??C (98.6 ??F)] 36.8 ??C (98.2 ??F)  Heart Rate:  [87-110] 92  Resp:  [16-18] 16  BP: (116-138)/(73-97) 116/73  MAP (mmHg):  [92-114] 92  SpO2:  [94 %-100 %] 100 %    Intake/Output last 24 hours:  I/O last 3 completed shifts:  In: 1800 [P.O.:1800]  Out: 2425 [Urine:1925; Emesis/NG output:500]    Physical Exam:  -General:  Appropriate, comfortable and in no apparent distress.   -Neurological: Alert and oriented x3. Moves all 4 extremities spontaneously.   -Pulmonary: Normal work of breathing. No accessory muscle use.  -Abdomen: Soft, distended, non tender to palpation, midline scarring, ventral hernia reduced, mild RLQ tenderness to palpation  -Genitourinary: Voiding spontaneously.   -Extremities: Warm, well perfused, normal skin turgor.    Data Review:    All lab  results last 24 hours:    Recent Results (from the past 24 hour(s))   POCT Glucose    Collection Time: 01/21/20 11:44 AM   Result Value Ref Range    Glucose, POC 124 70 - 179 mg/dL   POCT Glucose    Collection Time: 01/21/20  4:26 PM   Result Value Ref Range    Glucose, POC 113 70 - 179 mg/dL   POCT Glucose    Collection Time: 01/21/20  8:44 PM   Result Value Ref Range    Glucose, POC 136 70 - 179 mg/dL   Basic Metabolic Panel    Collection Time: 01/22/20  4:29 AM   Result Value Ref Range    Sodium 138 135 - 145 mmol/L    Potassium 3.4 3.4 - 4.5 mmol/L    Chloride 102 98 - 107 mmol/L    CO2 25.0 20.0 - 31.0 mmol/L    Anion Gap 11 5 - 14 mmol/L    BUN 21 9 - 23 mg/dL    Creatinine 1.61 (H) 0.60 - 1.10 mg/dL    BUN/Creatinine Ratio 17     EGFR CKD-EPI Non-African American, Male 82 >=60 mL/min/1.42m2    EGFR CKD-EPI African American, Male 64 >=60 mL/min/1.61m2 Glucose 127 70 - 179 mg/dL    Calcium 7.3 (L) 8.7 - 10.4 mg/dL   Magnesium Level    Collection Time: 01/22/20  4:29 AM   Result Value Ref Range    Magnesium 2.0 1.6 - 2.6 mg/dL   Phosphorus Level    Collection Time: 01/22/20  4:29 AM   Result Value Ref Range    Phosphorus 7.5 (H) 2.4 - 5.1 mg/dL   CBC w/ Differential    Collection Time: 01/22/20  4:29 AM   Result Value Ref Range    WBC 13.0 (H) 4.5 - 11.0 10*9/L    RBC 5.60 4.50 - 5.90 10*12/L    HGB 14.0 13.5 - 17.5 g/dL    HCT 09.6 04.5 - 40.9 %    MCV 79.5 (L) 80.0 - 100.0 fL    MCH 25.0 (L) 26.0 - 34.0 pg    MCHC 31.4 31.0 - 37.0 g/dL    RDW 81.1 (H) 91.4 - 15.0 %    MPV 10.2 (H) 7.0 - 10.0 fL    Platelet 345 150 - 440 10*9/L    Neutrophils % 89.7 %    Lymphocytes % 4.4 %    Monocytes % 4.5 %    Eosinophils % 0.6 %    Basophils % 0.4 %    Neutrophil Left Shift 1+ (A) Not Present    Absolute Neutrophils 11.7 (H) 2.0 - 7.5 10*9/L    Absolute Lymphocytes 0.6 (L) 1.5 - 5.0 10*9/L    Absolute Monocytes 0.6 0.2 - 0.8 10*9/L    Absolute Eosinophils 0.1 0.0 - 0.4 10*9/L    Absolute Basophils 0.1 0.0 - 0.1 10*9/L    Large Unstained Cells 1 0 - 4 %    Microcytosis Moderate (A) Not Present    Anisocytosis Slight (A) Not Present    Hypochromasia Slight (A) Not Present   POCT Glucose    Collection Time: 01/22/20  6:44 AM   Result Value Ref Range    Glucose, POC 130 70 - 179 mg/dL         Imaging: Radiology studies were personally reviewed

## 2020-01-22 NOTE — Unmapped (Signed)
VENOUS ACCESS ULTRASOUND PROCEDURE NOTE    Indications:   Poor venous access.    The Venous Access Team has assessed this patient for the placement of a PIV. Ultrasound guidance was necessary to obtain access.     Procedure Details:  Identity of the patient was confirmed via name, medical record number and date of birth. The availability of the correct equipment was verified.    The vein was identified for ultrasound catheter insertion.  Field was prepared with necessary supplies and equipment.  Probe cover and sterile gel utilized.  Insertion site was prepped with chlorhexidine solution and allowed to dry.  The catheter extension was primed with normal saline.A(n) 20g x 1.75 inch catheter was placed in the left forearm with 1 attempt(s). See LDA for additional details.    Catheter aspirated, 1 mL blood return present. The catheter was then flushed with 10 mL of normal saline. Insertion site cleansed, and dressing applied per manufacturer guidelines. The catheter was inserted without difficulty  by Cristal Deer RN.     Care Nurse RN was notified.     Thank you,     Cristal Deer RN Venous Access Team   [phone]     Workup / Procedure Time:  30 minutes    See vein image below:

## 2020-01-22 NOTE — Unmapped (Signed)
Adult Nutrition Assessment Note    Visit Type: RN Consult  Reason for Visit: Per Admission Nutrition Screen (Adult),Have you gained or lost 10 pounds in the past 3 months?      HPI & PMH:  Per EMR review - Kenneth Fitzgerald 45 y.o. 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     Anthropometric Data:  Height: 188 cm (6' 2)   Admission weight: (!) 149.7 kg (330 lb)  Last recorded weight: (!) 149.7 kg (330 lb)  IBW: 86.23 kg  Percent IBW: 173.59 %  BMI: Body mass index is 42.37 kg/m??.   Usual Body Weight: Unable to obtain at this time     Weight history prior to admission: Patient with 20 lb gain x two months per hx.  Wt Readings from Last 10 Encounters:   01/20/20 (!) 149.7 kg (330 lb)   01/13/20 (!) 149.3 kg (329 lb 1.6 oz)   01/03/20 (!) 150.4 kg (331 lb 9.2 oz)   01/03/20 (!) 150.4 kg (331 lb 9.6 oz)   12/23/19 (!) 147.1 kg (324 lb 4.8 oz)   11/25/19 (!) 144.9 kg (319 lb 8 oz)   11/11/19 (!) 144.4 kg (318 lb 4.8 oz)   09/30/19 (!) 141.4 kg (311 lb 12.8 oz)   08/30/19 (!) 141 kg (310 lb 14.4 oz)   08/24/19 (!) 142 kg (313 lb)        Weight changes this admission:   Last 5 Recorded Weights    01/20/20 1748   Weight: (!) 149.7 kg (330 lb)        Nutrition Focused Physical Exam:  Nutrition Focused Physical Exam:                        Nutrition Evaluation  Overall Impressions: Nutrition-Focused Physical Exam not indicated due to lack of malnutrition risk factors. (01/22/20 1543)  Nutrition Designation: Obese class III  (BMI > 39.99 kg/m2) (01/22/20 1543)      NUTRITIONALLY RELEVANT DATA     Medications:   Nutritionally pertinent medications reviewed and evaluated for potential food and/or medication interactions and include Colace, insulin, Synthroid, Phenergan, LR @ 100 mL/hr     Labs:   Nutritionally pertinent labs reviewed and include Phosphorus: 7.5 mg/dL mg/dL and Ca 7.3 mg/dL; Cr 2.84 mg/dL    Nutrition History:   January 22, 2020: Prior to admission: Patient unable to provide at this time. Patient started to discuss his nutrition hx but was unable to proceed due to starting to vomit in bag. Patient confirmed recent weight gain due to steroids. Per RN, patient started the day on Regular diet, began to vomit; was transitioned to clear liquid, again vomiting. Now NPO with sips. Patient was anticipated discharge pending tolerance to meal yesterday. Patient given Zofran and may start on Phenergan; possible NGT placement pending. Loose BMs and passing flatus per notes.    Allergies, Intolerances, Sensitivities, and/or Cultural/Religious Dietary Restrictions: include coconut    Current Nutrition:  NPO       Nutrition Orders   (From admission, onward)             Start     Ordered    01/22/20 1411  NPO Sips with  meds; Medically necessary  Effective now        Question Answer Comment   NPO Except: Sips with meds    Reason: Medically necessary        01/22/20 1410                   Nutritional Needs:   Daily Estimated Nutrient Needs:  Energy: [phone] kcal/day (BMR x 1.1-1.2) kcals Per [place] [employer]-Jeor Equation using last recorded weight, 150 kg (01/22/20 1552)]  Protein: 135-148 gm/day gm [other (comment) (20% of kcal) using last recorded weight, 150 kg (01/22/20 1552)]  Carbohydrate:   [no restriction]  Fluid:   mL [1 mL/kcal (maintenance)]        Malnutrition Assessment using AND/ASPEN Clinical Characteristics:    Patient does not meet AND/ASPEN criteria for malnutrition at this time (01/22/20 1544)       GOALS and EVALUATION     ??? Patient to meet 75% or greater of nutritional needs via combination of meals, snacks, and/or oral supplements within 7 days.  - New    Motivation, Barriers, and Compliance:  Evaluation of motivation, barriers, and compliance completed and include clinical condition     NUTRITION ASSESSMENT     ??? Current oral intake is not meeting nutritional needs at this time due to N/V, current NPO       Discharge Planning:   Monitor for potential discharge needs with multi-disciplinary team.       NUTRITION INTERVENTIONS and RECOMMENDATION     1. Advance to Regular diet when appropriate and as tolerated.  2. Continue anti-emetic as needed.  3. Continue to monitor lytes, minerals, Phos; correct as needed.  4. Continue to monitor BMs.  5. Please weigh weekly.    Follow-Up Parameters:   1-2 times per week (and more frequent as indicated)    Herschell Virani L. Noralyn Pick, MS, RD, LDN  Cross-coverage Dietitian

## 2020-01-22 NOTE — Unmapped (Signed)
VSS.   Pt afebrile . With no s/s of infecion noted. .  No falls noted. Pt educated to call for assistance if needed.. Pt medicated for nausea x1. Pain  controlled by   prn pain meds. DVT protocol in place.pt on lovenox. Will continue to monitor.    Problem: Pain Acute  Goal: Acceptable Pain Control and Functional Ability  Outcome: Progressing     Problem: VTE (Venous Thromboembolism)  Goal: VTE (Venous Thromboembolism) Symptom Resolution  Outcome: Progressing

## 2020-01-22 NOTE — Unmapped (Signed)
No acute events this shift, VSS on RA, A&O x4. No complaints of pain requiring PRN meds this shift. Pt complained of cramping to R side of abdomen and middle of abdomen middle of day, NP Wilkes paged, pt given ok to eat. Pt had BM today and reports passing gas. Able to ambulate independently. Voiding adequately.     Anticipate discharge orders this shift. Discharge orders written with instructions if tolerates meal. Pt ate late lunch and wanted to make sure he felt ok before discharging home, NP Wilkes to bedside to talk with pt. Pt's ride will be here at 7:15pm, AVS printed and given to pt. Will pick up prescriptions at home pharmacy.

## 2020-01-22 NOTE — Unmapped (Signed)
The Venous Access Team (VAT) received a request from care RN for PIV access.Marland Kitchen   VAT assessed patient  at bedside.  Appropriate mask and eye protection were utilized.  Upon entering room, patient reported he didn't think he needed access because he was probably going home today.  Discussed this with care RN.  Patient has saline bolus ordered.  Will touch base with med team prior to placing PIV.   Will return to place PIV if needed.     PIV Workup / Procedure Time:  15 minutes    RN was notified.     Thank you,     Iantha Fallen RN Venous Access Team

## 2020-01-23 DIAGNOSIS — Z9049 Acquired absence of other specified parts of digestive tract: Principal | ICD-10-CM

## 2020-01-23 DIAGNOSIS — N179 Acute kidney failure, unspecified: Principal | ICD-10-CM

## 2020-01-23 DIAGNOSIS — C719 Malignant neoplasm of brain, unspecified: Principal | ICD-10-CM

## 2020-01-23 DIAGNOSIS — C786 Secondary malignant neoplasm of retroperitoneum and peritoneum: Principal | ICD-10-CM

## 2020-01-23 DIAGNOSIS — C73 Malignant neoplasm of thyroid gland: Principal | ICD-10-CM

## 2020-01-23 DIAGNOSIS — K5010 Crohn's disease of large intestine without complications: Principal | ICD-10-CM

## 2020-01-23 DIAGNOSIS — Z6841 Body Mass Index (BMI) 40.0 and over, adult: Principal | ICD-10-CM

## 2020-01-23 DIAGNOSIS — F330 Major depressive disorder, recurrent, mild: Principal | ICD-10-CM

## 2020-01-23 DIAGNOSIS — E119 Type 2 diabetes mellitus without complications: Principal | ICD-10-CM

## 2020-01-23 DIAGNOSIS — Z8042 Family history of malignant neoplasm of prostate: Principal | ICD-10-CM

## 2020-01-23 DIAGNOSIS — Z20822 Contact with and (suspected) exposure to covid-19: Principal | ICD-10-CM

## 2020-01-23 DIAGNOSIS — C787 Secondary malignant neoplasm of liver and intrahepatic bile duct: Principal | ICD-10-CM

## 2020-01-23 DIAGNOSIS — Z800 Family history of malignant neoplasm of digestive organs: Principal | ICD-10-CM

## 2020-01-23 DIAGNOSIS — M109 Gout, unspecified: Principal | ICD-10-CM

## 2020-01-23 DIAGNOSIS — R188 Other ascites: Principal | ICD-10-CM

## 2020-01-23 DIAGNOSIS — E861 Hypovolemia: Principal | ICD-10-CM

## 2020-01-23 DIAGNOSIS — I10 Essential (primary) hypertension: Principal | ICD-10-CM

## 2020-01-23 DIAGNOSIS — Z923 Personal history of irradiation: Principal | ICD-10-CM

## 2020-01-23 DIAGNOSIS — E440 Moderate protein-calorie malnutrition: Principal | ICD-10-CM

## 2020-01-23 DIAGNOSIS — C439 Malignant melanoma of skin, unspecified: Principal | ICD-10-CM

## 2020-01-23 DIAGNOSIS — K430 Incisional hernia with obstruction, without gangrene: Principal | ICD-10-CM

## 2020-01-23 DIAGNOSIS — E890 Postprocedural hypothyroidism: Principal | ICD-10-CM

## 2020-01-23 DIAGNOSIS — K56609 Unspecified intestinal obstruction, unspecified as to partial versus complete obstruction: Principal | ICD-10-CM

## 2020-01-23 LAB — CBC W/ AUTO DIFF
BASOPHILS ABSOLUTE COUNT: 0 10*9/L (ref 0.0–0.1)
BASOPHILS RELATIVE PERCENT: 0.2 %
EOSINOPHILS ABSOLUTE COUNT: 0.1 10*9/L (ref 0.0–0.4)
EOSINOPHILS RELATIVE PERCENT: 0.4 %
HEMATOCRIT: 47.8 % (ref 41.0–53.0)
HEMOGLOBIN: 14.8 g/dL (ref 13.5–17.5)
LARGE UNSTAINED CELLS: 1 % (ref 0–4)
LYMPHOCYTES ABSOLUTE COUNT: 0.8 10*9/L — ABNORMAL LOW (ref 1.5–5.0)
LYMPHOCYTES RELATIVE PERCENT: 6.4 %
MEAN CORPUSCULAR HEMOGLOBIN CONC: 31 g/dL (ref 31.0–37.0)
MEAN CORPUSCULAR HEMOGLOBIN: 24.9 pg — ABNORMAL LOW (ref 26.0–34.0)
MEAN CORPUSCULAR VOLUME: 80.1 fL (ref 80.0–100.0)
MEAN PLATELET VOLUME: 9.3 fL (ref 7.0–10.0)
MONOCYTES ABSOLUTE COUNT: 0.9 10*9/L — ABNORMAL HIGH (ref 0.2–0.8)
MONOCYTES RELATIVE PERCENT: 7.5 %
NEUTROPHILS ABSOLUTE COUNT: 10.4 10*9/L — ABNORMAL HIGH (ref 2.0–7.5)
NEUTROPHILS RELATIVE PERCENT: 84.5 %
PLATELET COUNT: 321 10*9/L (ref 150–440)
RED BLOOD CELL COUNT: 5.96 10*12/L — ABNORMAL HIGH (ref 4.50–5.90)
RED CELL DISTRIBUTION WIDTH: 17.5 % — ABNORMAL HIGH (ref 12.0–15.0)
WBC ADJUSTED: 12.3 10*9/L — ABNORMAL HIGH (ref 4.5–11.0)

## 2020-01-23 LAB — BASIC METABOLIC PANEL
ANION GAP: 16 mmol/L — ABNORMAL HIGH (ref 5–14)
ANION GAP: 7 mmol/L (ref 5–14)
BLOOD UREA NITROGEN: 23 mg/dL (ref 9–23)
BLOOD UREA NITROGEN: 16 mg/dL (ref 9–23)
BUN / CREAT RATIO: 21
BUN / CREAT RATIO: 15
CALCIUM: 7.1 mg/dL — ABNORMAL LOW (ref 8.7–10.4)
CALCIUM: 6.6 mg/dL — ABNORMAL LOW (ref 8.7–10.4)
CHLORIDE: 101 mmol/L (ref 98–107)
CHLORIDE: 107 mmol/L (ref 98–107)
CO2: 20 mmol/L (ref 20.0–31.0)
CO2: 26 mmol/L (ref 20.0–31.0)
CREATININE: 1.12 mg/dL — ABNORMAL HIGH
CREATININE: 1.09 mg/dL
EGFR CKD-EPI AA MALE: >90 mL/min/{1.73_m2} (ref >=60)
EGFR CKD-EPI AA MALE: >90 mL/min/{1.73_m2} (ref >=60)
EGFR CKD-EPI NON-AA MALE: 79 mL/min/{1.73_m2} (ref >=60)
EGFR CKD-EPI NON-AA MALE: 82 mL/min/{1.73_m2} (ref >=60)
GLUCOSE RANDOM: 151 mg/dL (ref 70–179)
GLUCOSE RANDOM: 120 mg/dL (ref 70–179)
POTASSIUM: 3 mmol/L — ABNORMAL LOW (ref 3.4–4.5)
POTASSIUM: 3.5 mmol/L (ref 3.4–4.5)
SODIUM: 137 mmol/L (ref 135–145)
SODIUM: 140 mmol/L (ref 135–145)

## 2020-01-23 LAB — LACTATE, VENOUS, WHOLE BLOOD: LACTATE BLOOD VENOUS: 1.1 mmol/L (ref 0.5–1.8)

## 2020-01-23 LAB — MAGNESIUM
MAGNESIUM: 1.9 mg/dL (ref 1.6–2.6)
MAGNESIUM: 1.9 mg/dL (ref 1.6–2.6)

## 2020-01-23 LAB — PHOSPHORUS
PHOSPHORUS: 6.9 mg/dL — ABNORMAL HIGH (ref 2.4–5.1)
PHOSPHORUS: 5.5 mg/dL — ABNORMAL HIGH (ref 2.4–5.1)

## 2020-01-23 MED ADMIN — levothyroxine (SYNTHROID) tablet 250 mcg: 250 ug | ORAL | @ 16:00:00 | Stop: 2020-01-25

## 2020-01-23 MED ADMIN — lactated Ringers infusion: 100 mL/h | INTRAVENOUS | @ 04:00:00 | Stop: 2020-01-23

## 2020-01-23 MED ADMIN — HYDROmorphone (PF) (DILAUDID) injection 0.5 mg: 0.5 mg | INTRAVENOUS | @ 14:00:00 | Stop: 2020-01-27

## 2020-01-23 MED ADMIN — lactated ringers bolus 500 mL: 500 mL | INTRAVENOUS | @ 12:00:00 | Stop: 2020-01-23

## 2020-01-23 MED ADMIN — promethazine (PHENERGAN) 12.5 mg in sodium chloride (NS) 0.9 % 25 mL infusion: 12.5 mg | INTRAVENOUS | @ 03:00:00 | Stop: 2020-02-02

## 2020-01-23 MED ADMIN — potassium chloride 10 mEq in 100 mL IVPB: 10 meq | INTRAVENOUS | @ 22:00:00 | Stop: 2020-01-23

## 2020-01-23 MED ADMIN — DULoxetine (CYMBALTA) DR capsule 30 mg: 30 mg | ORAL | @ 03:00:00 | Stop: 2020-02-02

## 2020-01-23 MED ADMIN — oxyCODONE (ROXICODONE) immediate release tablet 5 mg: 5 mg | ORAL | @ 01:00:00 | Stop: 2020-01-27

## 2020-01-23 MED ADMIN — docusate sodium (COLACE) capsule 100 mg: 100 mg | ORAL | @ 16:00:00 | Stop: 2020-01-24

## 2020-01-23 MED ADMIN — iohexoL (OMNIPAQUE) 350 mg iodine/mL solution 100 mL: 100 mL | INTRAVENOUS | @ 20:00:00 | Stop: 2020-01-23

## 2020-01-23 MED ADMIN — promethazine (PHENERGAN) 12.5 mg in sodium chloride (NS) 0.9 % 25 mL infusion: 12.5 mg | INTRAVENOUS | @ 19:00:00 | Stop: 2020-02-02

## 2020-01-23 MED ADMIN — HYDROmorphone (PF) (DILAUDID) injection 0.5 mg: 0.5 mg | INTRAVENOUS | @ 22:00:00 | Stop: 2020-01-27

## 2020-01-23 MED ADMIN — potassium chloride 10 mEq in 100 mL IVPB: 10 meq | INTRAVENOUS | @ 23:00:00 | Stop: 2020-01-23

## 2020-01-23 MED ADMIN — lactated Ringers infusion: 100 mL/h | INTRAVENOUS | @ 11:00:00 | Stop: 2020-01-23

## 2020-01-23 MED ADMIN — dexAMETHasone (DECADRON) tablet 4 mg: 4 mg | ORAL | @ 16:00:00 | Stop: 2020-01-23

## 2020-01-23 MED ADMIN — dextrose 5 % and sodium chloride 0.9 % with KCl 20 mEq/L infusion: 125 mL/h | INTRAVENOUS | @ 12:00:00 | Stop: 2020-01-27

## 2020-01-23 MED ADMIN — DULoxetine (CYMBALTA) DR capsule 30 mg: 30 mg | ORAL | @ 16:00:00 | Stop: 2020-02-02

## 2020-01-23 MED ADMIN — promethazine (PHENERGAN) 12.5 mg in sodium chloride (NS) 0.9 % 25 mL infusion: 12.5 mg | INTRAVENOUS | @ 14:00:00 | Stop: 2020-02-02

## 2020-01-23 MED ADMIN — enoxaparin (LOVENOX) syringe 40 mg: 40 mg | SUBCUTANEOUS | @ 03:00:00 | Stop: 2020-02-02

## 2020-01-23 MED ADMIN — enoxaparin (LOVENOX) syringe 40 mg: 40 mg | SUBCUTANEOUS | @ 16:00:00 | Stop: 2020-02-02

## 2020-01-23 MED ADMIN — sodium chloride 0.9% (NS) bolus 500 mL: 500 mL | INTRAVENOUS | @ 20:00:00 | Stop: 2020-01-23

## 2020-01-23 NOTE — Unmapped (Signed)
Problem: Pain Acute  Goal: Acceptable Pain Control and Functional Ability  Outcome: Ongoing - Unchanged     Problem: VTE (Venous Thromboembolism)  Goal: VTE (Venous Thromboembolism) Symptom Resolution  Outcome: Ongoing - Unchanged

## 2020-01-23 NOTE — Unmapped (Signed)
[place] TRAU[facility], ACUTE CARE, and GENERAL SURGERY   - Surgery Daily Progress Note -  01/23/2020       Admit Date: 01/18/2020, Hospital Day: 6  Hospital Service: SurgTrauma [facility])      Assessment     Kenneth Fitzgerald [facility] 45 y.o. 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      *Chron's disease  -  Hold home sulfasalazine in the setting of bowel obstruction    GU: AKI on admission Cr peaked at 1.34 likely due to nausea, vomiting and poor PO intake in the setting of SBO (resolved)  - Adequate UOP.   - Voiding spontaneously  - Cr 1.12 from 1.26.  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   - Dexamethasone per onc, weaning down to 4mg  daily ending 1/10    ID:  - Afebrile.  - WBC- 12.3 from 13    PPx:   - Heparin     Dispo: Floor status  PT/OT: not indicated  Barriers to discharge: Nausea, SBO  CM/SW assisting in discharge planning.     Contact: [facility]  Amy London Pepper  [phone]      Subjective:   Has not passed flatus since last night.  Reports his abdominal pain is slightly worse that yesterday and is mostly a soreness at his hernia.  Nausea overnight.      Objective:     Vital signs in last 24 hours:  Temp:  [35.2 ??C (95.4 ??F)-36.2 ??C (97.2 ??F)] 35.8 ??C (96.4 ??F)  Heart Rate:  [87-101] 90  Resp:  [16-18] 17  BP: (133-159)/(83-101) 133/84  MAP (mmHg):  [104-130] 104  SpO2:  [97 %-99 %] 98 %    Intake/Output last 24 hours:  I/O last 3 completed shifts:  In: 2117.5 [P.O.:390; I.V.:1727.5]  Out: 3575 [Urine:1325; Emesis/NG output:2250]    Physical Exam:  -General:  Appropriate, comfortable and in no apparent distress.   -Neurological: Alert and oriented x3. Moves all  4 extremities spontaneously.   -Pulmonary: Normal work of breathing. No accessory muscle use.  -Abdomen: Soft, mildly distended, tender to palpation midline at hernia- hernia feels soft and reduced, no rebound or guarding.  NGT with brown output.    -Genitourinary: Voiding spontaneously.   -Extremities: Warm, well perfused, normal skin turgor.    Data Review:    All lab results last 24 hours:    Recent Results (from the past 24 hour(s))   POCT Glucose    Collection Time: 01/22/20 12:05 PM   Result Value Ref Range    Glucose, POC 143 70 - 179 mg/dL   POCT Glucose    Collection Time: 01/22/20  4:45 PM   Result Value Ref Range    Glucose, POC 138 70 - 179 mg/dL   POCT Glucose    Collection Time: 01/22/20  9:23 PM   Result Value Ref Range    Glucose, POC 145 70 - 179 mg/dL   Basic Metabolic Panel    Collection Time: 01/23/20  4:21 AM   Result Value Ref Range    Sodium 137 135 - 145 mmol/L    Potassium 3.0 (L) 3.4 - 4.5 mmol/L    Chloride 101 98 - 107 mmol/L    CO2 20.0 20.0 - 31.0 mmol/L    Anion Gap 16 (H) 5 - 14 mmol/L    BUN 23 9 - 23 mg/dL    Creatinine 1.61 (H) 0.60 - 1.10 mg/dL    BUN/Creatinine Ratio 21     EGFR CKD-EPI Non-African American, Male 49 >=60 mL/min/1.21m2    EGFR CKD-EPI African American, Male >90 >=60 mL/min/1.92m2    Glucose 151 70 - 179 mg/dL    Calcium 7.1 (L) 8.7 - 10.4 mg/dL   Magnesium Level    Collection Time: 01/23/20  4:21 AM   Result Value Ref Range    Magnesium 1.9 1.6 - 2.6 mg/dL   Phosphorus Level    Collection Time: 01/23/20  4:21 AM   Result Value Ref Range    Phosphorus 6.9 (H) 2.4 - 5.1 mg/dL   CBC w/ Differential    Collection Time: 01/23/20  4:21 AM   Result Value Ref Range    WBC 12.3 (H) 4.5 - 11.0 10*9/L    RBC 5.96 (H) 4.50 - 5.90 10*12/L    HGB 14.8 13.5 - 17.5 g/dL    HCT 09.6 04.5 - 40.9 %    MCV 80.1 80.0 - 100.0 fL    MCH 24.9 (L) 26.0 - 34.0 pg    MCHC 31.0 31.0 - 37.0 g/dL    RDW 81.1 (H) 91.4 - 15.0 %    MPV 9.3 7.0 - 10.0 fL    Platelet 321 150 - 440 10*9/L    Neutrophils % 84.5 %    Lymphocytes % 6.4 %    Monocytes % 7.5 %    Eosinophils % 0.4 %    Basophils % 0.2 %    Neutrophil Left Shift 1+ (A) Not Present    Absolute Neutrophils 10.4 (H) 2.0 - 7.5 10*9/L    Absolute Lymphocytes 0.8 (L) 1.5 - 5.0 10*9/L    Absolute Monocytes 0.9 (H) 0.2 - 0.8 10*9/L    Absolute Eosinophils 0.1 0.0 - 0.4 10*9/L    Absolute Basophils 0.0 0.0 - 0.1 10*9/L    Large Unstained Cells 1 0 - 4 %    Microcytosis Moderate (A) Not Present    Anisocytosis Slight (A) Not Present    Hypochromasia Slight (  A) Not Present   POCT Glucose    Collection Time: 01/23/20  6:49 AM   Result Value Ref Range    Glucose, POC 142 70 - 179 mg/dL   POCT Glucose    Collection Time: 01/23/20  8:34 AM   Result Value Ref Range    Glucose, POC 133 70 - 179 mg/dL         Imaging: CT pending    Attending Attestation  I saw and evaluated the patient, participating in the key portions of the service. I reviewed the resident???s note. I agree with the resident???s findings and plan.  Celso Amy MD

## 2020-01-23 NOTE — Unmapped (Signed)
PT emesis multiple times this shift. NGT inserted to low continuous wall suction. IV clean dry and intact and infusing. NPO status maintained. Given zofran x2, phenergan x1, oxy x1 for complaints of pain. Pt voiding regularly. Bed locked and low. Call bell within reach.     Problem: Pain Acute  Goal: Acceptable Pain Control and Functional Ability  Outcome: Progressing     Problem: VTE (Venous Thromboembolism)  Goal: VTE (Venous Thromboembolism) Symptom Resolution  Outcome: Progressing  Intervention: Prevent or Manage VTE (Venous Thromboembolism)  Recent Flowsheet Documentation  Taken 01/22/2020 1800 by Hinda Lenis, RN  Anti-Embolism Intervention: Refused  Taken 01/22/2020 1600 by Hinda Lenis, RN  Anti-Embolism Intervention: Refused  Taken 01/22/2020 1400 by Hinda Lenis, RN  Anti-Embolism Intervention: Refused  Taken 01/22/2020 1200 by Hinda Lenis, RN  Anti-Embolism Intervention: Refused  Taken 01/22/2020 1000 by Hinda Lenis, RN  Anti-Embolism Intervention: Refused  Taken 01/22/2020 0800 by Hinda Lenis, RN  Anti-Embolism Intervention: Refused

## 2020-01-23 NOTE — Unmapped (Signed)
VSS.   Pt afebrile . With no s/s of infecion noted. NGT to LIWS . No falls noted. Pt educated to call for assistance if needed..  . Pain  controlled by prn pain meds. DVT protocol in place.pt on lovenox.. Will continue to monitor.    Problem: Pain Acute  Goal: Acceptable Pain Control and Functional Ability  Outcome: Progressing     Problem: VTE (Venous Thromboembolism)  Goal: VTE (Venous Thromboembolism) Symptom Resolution  Outcome: Progressing

## 2020-01-24 DIAGNOSIS — E119 Type 2 diabetes mellitus without complications: Principal | ICD-10-CM

## 2020-01-24 DIAGNOSIS — M109 Gout, unspecified: Principal | ICD-10-CM

## 2020-01-24 DIAGNOSIS — K430 Incisional hernia with obstruction, without gangrene: Principal | ICD-10-CM

## 2020-01-24 DIAGNOSIS — Z923 Personal history of irradiation: Principal | ICD-10-CM

## 2020-01-24 DIAGNOSIS — E861 Hypovolemia: Principal | ICD-10-CM

## 2020-01-24 DIAGNOSIS — Z8042 Family history of malignant neoplasm of prostate: Principal | ICD-10-CM

## 2020-01-24 DIAGNOSIS — C439 Malignant melanoma of skin, unspecified: Principal | ICD-10-CM

## 2020-01-24 DIAGNOSIS — N179 Acute kidney failure, unspecified: Principal | ICD-10-CM

## 2020-01-24 DIAGNOSIS — Z6841 Body Mass Index (BMI) 40.0 and over, adult: Principal | ICD-10-CM

## 2020-01-24 DIAGNOSIS — C786 Secondary malignant neoplasm of retroperitoneum and peritoneum: Principal | ICD-10-CM

## 2020-01-24 DIAGNOSIS — Z9049 Acquired absence of other specified parts of digestive tract: Principal | ICD-10-CM

## 2020-01-24 DIAGNOSIS — Z20822 Contact with and (suspected) exposure to covid-19: Principal | ICD-10-CM

## 2020-01-24 DIAGNOSIS — E890 Postprocedural hypothyroidism: Principal | ICD-10-CM

## 2020-01-24 DIAGNOSIS — I10 Essential (primary) hypertension: Principal | ICD-10-CM

## 2020-01-24 DIAGNOSIS — F330 Major depressive disorder, recurrent, mild: Principal | ICD-10-CM

## 2020-01-24 DIAGNOSIS — E440 Moderate protein-calorie malnutrition: Principal | ICD-10-CM

## 2020-01-24 DIAGNOSIS — C719 Malignant neoplasm of brain, unspecified: Principal | ICD-10-CM

## 2020-01-24 DIAGNOSIS — R188 Other ascites: Principal | ICD-10-CM

## 2020-01-24 DIAGNOSIS — C787 Secondary malignant neoplasm of liver and intrahepatic bile duct: Principal | ICD-10-CM

## 2020-01-24 DIAGNOSIS — Z800 Family history of malignant neoplasm of digestive organs: Principal | ICD-10-CM

## 2020-01-24 DIAGNOSIS — K56609 Unspecified intestinal obstruction, unspecified as to partial versus complete obstruction: Principal | ICD-10-CM

## 2020-01-24 DIAGNOSIS — C73 Malignant neoplasm of thyroid gland: Principal | ICD-10-CM

## 2020-01-24 DIAGNOSIS — K5010 Crohn's disease of large intestine without complications: Principal | ICD-10-CM

## 2020-01-24 LAB — CBC W/ AUTO DIFF
BASOPHILS ABSOLUTE COUNT: 0 10*9/L (ref 0.0–0.1)
BASOPHILS RELATIVE PERCENT: 0.4 %
EOSINOPHILS ABSOLUTE COUNT: 0.1 10*9/L (ref 0.0–0.4)
EOSINOPHILS RELATIVE PERCENT: 1.2 %
HEMATOCRIT: 42.4 % (ref 41.0–53.0)
HEMOGLOBIN: 12.9 g/dL — ABNORMAL LOW (ref 13.5–17.5)
LARGE UNSTAINED CELLS: 1 % (ref 0–4)
LYMPHOCYTES ABSOLUTE COUNT: 0.7 10*9/L — ABNORMAL LOW (ref 1.5–5.0)
LYMPHOCYTES RELATIVE PERCENT: 7.6 %
MEAN CORPUSCULAR HEMOGLOBIN CONC: 30.4 g/dL — ABNORMAL LOW (ref 31.0–37.0)
MEAN CORPUSCULAR HEMOGLOBIN: 25.1 pg — ABNORMAL LOW (ref 26.0–34.0)
MEAN CORPUSCULAR VOLUME: 82.5 fL (ref 80.0–100.0)
MEAN PLATELET VOLUME: 10.8 fL — ABNORMAL HIGH (ref 7.0–10.0)
MONOCYTES ABSOLUTE COUNT: 0.8 10*9/L (ref 0.2–0.8)
MONOCYTES RELATIVE PERCENT: 8.5 %
NEUTROPHILS ABSOLUTE COUNT: 7.5 10*9/L (ref 2.0–7.5)
NEUTROPHILS RELATIVE PERCENT: 81.4 %
PLATELET COUNT: 272 10*9/L (ref 150–440)
RED BLOOD CELL COUNT: 5.14 10*12/L (ref 4.50–5.90)
RED CELL DISTRIBUTION WIDTH: 19 % — ABNORMAL HIGH (ref 12.0–15.0)
WBC ADJUSTED: 9.2 10*9/L (ref 4.5–11.0)

## 2020-01-24 LAB — MAGNESIUM
MAGNESIUM: 1.9 mg/dL (ref 1.6–2.6)
MAGNESIUM: 2.2 mg/dL (ref 1.6–2.6)

## 2020-01-24 LAB — BASIC METABOLIC PANEL
ANION GAP: 9 mmol/L (ref 5–14)
ANION GAP: 11 mmol/L (ref 5–14)
BLOOD UREA NITROGEN: 16 mg/dL (ref 9–23)
BLOOD UREA NITROGEN: 16 mg/dL (ref 9–23)
BUN / CREAT RATIO: 14
BUN / CREAT RATIO: 17
CALCIUM: 6.4 mg/dL — ABNORMAL LOW (ref 8.7–10.4)
CALCIUM: 6.4 mg/dL — ABNORMAL LOW (ref 8.7–10.4)
CHLORIDE: 107 mmol/L (ref 98–107)
CHLORIDE: 103 mmol/L (ref 98–107)
CO2: 26 mmol/L (ref 20.0–31.0)
CO2: 26 mmol/L (ref 20.0–31.0)
CREATININE: 1.13 mg/dL — ABNORMAL HIGH
CREATININE: 0.95 mg/dL
EGFR CKD-EPI AA MALE: >90 mL/min/{1.73_m2} (ref >=60)
EGFR CKD-EPI AA MALE: >90 mL/min/{1.73_m2} (ref >=60)
EGFR CKD-EPI NON-AA MALE: 79 mL/min/{1.73_m2} (ref >=60)
EGFR CKD-EPI NON-AA MALE: >90 mL/min/{1.73_m2} (ref >=60)
GLUCOSE RANDOM: 130 mg/dL (ref 70–179)
GLUCOSE RANDOM: 115 mg/dL (ref 70–179)
POTASSIUM: 3.1 mmol/L — ABNORMAL LOW (ref 3.4–4.5)
POTASSIUM: 3.6 mmol/L (ref 3.4–4.5)
SODIUM: 142 mmol/L (ref 135–145)
SODIUM: 140 mmol/L (ref 135–145)

## 2020-01-24 LAB — PHOSPHORUS
PHOSPHORUS: 5.3 mg/dL — ABNORMAL HIGH (ref 2.4–5.1)
PHOSPHORUS: 5.3 mg/dL — ABNORMAL HIGH (ref 2.4–5.1)

## 2020-01-24 LAB — PROTIME-INR
INR: 1.12
PROTIME: 13.1 s (ref 10.3–13.4)

## 2020-01-24 MED ADMIN — DULoxetine (CYMBALTA) DR capsule 30 mg: 30 mg | ORAL | @ 02:00:00 | Stop: 2020-02-02

## 2020-01-24 MED ADMIN — sodium chloride irrigation (NS) 0.9 % irrigation solution: @ 19:00:00 | Stop: 2020-01-24

## 2020-01-24 MED ADMIN — potassium chloride 10 mEq in 100 mL IVPB: 10 meq | INTRAVENOUS | @ 03:00:00 | Stop: 2020-01-23

## 2020-01-24 MED ADMIN — dexmedeTOMIDine (PRECEDEX) injection: INTRAVENOUS | @ 21:00:00 | Stop: 2020-01-24

## 2020-01-24 MED ADMIN — levothyroxine (SYNTHROID) tablet 375 mcg: 375 ug | ORAL | @ 13:00:00 | Stop: 2020-01-25

## 2020-01-24 MED ADMIN — fentaNYL (PF) (SUBLIMAZE) injection: INTRAVENOUS | @ 20:00:00 | Stop: 2020-01-24

## 2020-01-24 MED ADMIN — ROCuronium (ZEMURON) injection: INTRAVENOUS | @ 21:00:00 | Stop: 2020-01-24

## 2020-01-24 MED ADMIN — ketamine (KETALAR) injection: INTRAVENOUS | @ 21:00:00 | Stop: 2020-01-24

## 2020-01-24 MED ADMIN — enoxaparin (LOVENOX) syringe 40 mg: 40 mg | SUBCUTANEOUS | @ 13:00:00 | Stop: 2020-02-02

## 2020-01-24 MED ADMIN — metroNIDAZOLE (FLAGYL) IVPB: INTRAVENOUS | @ 19:00:00 | Stop: 2020-01-24

## 2020-01-24 MED ADMIN — potassium chloride 10 mEq in 100 mL IVPB: 10 meq | INTRAVENOUS | @ 13:00:00 | Stop: 2020-01-24

## 2020-01-24 MED ADMIN — ondansetron (ZOFRAN) injection: INTRAVENOUS | @ 21:00:00 | Stop: 2020-01-24

## 2020-01-24 MED ADMIN — ROCuronium (ZEMURON) injection: INTRAVENOUS | @ 19:00:00 | Stop: 2020-01-24

## 2020-01-24 MED ADMIN — fentaNYL (PF) (SUBLIMAZE) injection 25 mcg: 25 ug | INTRAVENOUS | @ 23:00:00 | Stop: 2020-01-24

## 2020-01-24 MED ADMIN — fentaNYL (PF) (SUBLIMAZE) injection: INTRAVENOUS | @ 21:00:00 | Stop: 2020-01-24

## 2020-01-24 MED ADMIN — electrolyte-A (PLASMA-LYT A) infusion: INTRAVENOUS | @ 21:00:00 | Stop: 2020-01-24

## 2020-01-24 MED ADMIN — dextrose 5 % and sodium chloride 0.9 % with KCl 20 mEq/L infusion: 125 mL/h | INTRAVENOUS | @ 03:00:00 | Stop: 2020-01-27

## 2020-01-24 MED ADMIN — ROCuronium (ZEMURON) injection: INTRAVENOUS | @ 20:00:00 | Stop: 2020-01-24

## 2020-01-24 MED ADMIN — ondansetron (ZOFRAN) injection 4 mg: 4 mg | INTRAVENOUS | @ 02:00:00 | Stop: 2020-01-31

## 2020-01-24 MED ADMIN — potassium chloride 10 mEq in 100 mL IVPB: 10 meq | INTRAVENOUS | @ 02:00:00 | Stop: 2020-01-23

## 2020-01-24 MED ADMIN — neostigmine (BLOXIVERZ) injection: INTRAVENOUS | @ 21:00:00 | Stop: 2020-01-24

## 2020-01-24 MED ADMIN — acetaminophen (OFIRMEV) 10 mg/mL injection 1,000 mg: 1000 mg | INTRAVENOUS | Stop: 2020-01-28

## 2020-01-24 MED ADMIN — magnesium sulfate 2gm/50mL IVPB: 2 g | INTRAVENOUS | @ 15:00:00 | Stop: 2020-01-24

## 2020-01-24 MED ADMIN — lactated ringers bolus 500 mL: 500 mL | INTRAVENOUS | @ 13:00:00 | Stop: 2020-01-24

## 2020-01-24 MED ADMIN — ketamine (KETALAR) injection: INTRAVENOUS | @ 22:00:00 | Stop: 2020-01-24

## 2020-01-24 MED ADMIN — dexamethasone (DECADRON) 4 mg/mL injection: INTRAVENOUS | @ 19:00:00 | Stop: 2020-01-24

## 2020-01-24 MED ADMIN — electrolyte-A (PLASMA-LYT A) infusion: INTRAVENOUS | @ 19:00:00 | Stop: 2020-01-24

## 2020-01-24 MED ADMIN — succinylcholine (ANECTINE) injection: INTRAVENOUS | @ 19:00:00 | Stop: 2020-01-24

## 2020-01-24 MED ADMIN — propofoL (DIPRIVAN) injection: INTRAVENOUS | @ 19:00:00 | Stop: 2020-01-24

## 2020-01-24 MED ADMIN — potassium chloride 10 mEq in 100 mL IVPB: 10 meq | INTRAVENOUS | @ 16:00:00 | Stop: 2020-01-24

## 2020-01-24 MED ADMIN — potassium chloride 10 mEq in 100 mL IVPB: 10 meq | INTRAVENOUS | @ 17:00:00 | Stop: 2020-01-24

## 2020-01-24 MED ADMIN — DULoxetine (CYMBALTA) DR capsule 30 mg: 30 mg | ORAL | @ 13:00:00 | Stop: 2020-02-02

## 2020-01-24 MED ADMIN — propofol (DIPRIVAN) infusion 10 mg/mL: INTRAVENOUS | @ 21:00:00 | Stop: 2020-01-24

## 2020-01-24 MED ADMIN — HYDROmorphone (DILAUDID) 50mg/50ml (1mg/ml) PCA CADD: INTRAVENOUS | @ 23:00:00 | Stop: 2020-01-28

## 2020-01-24 MED ADMIN — enoxaparin (LOVENOX) syringe 40 mg: 40 mg | SUBCUTANEOUS | @ 02:00:00 | Stop: 2020-02-02

## 2020-01-24 MED ADMIN — ceFAZolin (ANCEF) injection: INTRAVENOUS | @ 19:00:00 | Stop: 2020-01-24

## 2020-01-24 MED ADMIN — esmoloL (BREVIBLOC) injection: INTRAVENOUS | @ 21:00:00 | Stop: 2020-01-24

## 2020-01-24 MED ADMIN — HYDROmorphone (PF) (DILAUDID) injection 0.2 mg: .2 mg | INTRAVENOUS | @ 23:00:00 | Stop: 2020-01-24

## 2020-01-24 MED ADMIN — dextrose 5 % and sodium chloride 0.9 % with KCl 20 mEq/L infusion: 125 mL/h | INTRAVENOUS | @ 20:00:00 | Stop: 2020-01-27

## 2020-01-24 MED ADMIN — calcium gluconate 1 g in sodium chloride (NS) 0.9 % 100 mL IVPB: 1 g | INTRAVENOUS | @ 14:00:00 | Stop: 2020-01-24

## 2020-01-24 MED ADMIN — midazolam (VERSED) injection: INTRAVENOUS | @ 18:00:00 | Stop: 2020-01-24

## 2020-01-24 MED ADMIN — dexmedeTOMIDine (PRECEDEX) injection: INTRAVENOUS | @ 20:00:00 | Stop: 2020-01-24

## 2020-01-24 MED ADMIN — potassium chloride 10 mEq in 100 mL IVPB: 10 meq | INTRAVENOUS | @ 14:00:00 | Stop: 2020-01-24

## 2020-01-24 MED ADMIN — fentaNYL (PF) (SUBLIMAZE) injection: INTRAVENOUS | @ 19:00:00 | Stop: 2020-01-24

## 2020-01-24 MED ADMIN — glycopyrrolate (ROBINUL) injection: INTRAVENOUS | @ 21:00:00 | Stop: 2020-01-24

## 2020-01-24 MED ADMIN — lidocaine (XYLOCAINE) 20 mg/mL (2 %) injection: INTRAVENOUS | @ 19:00:00 | Stop: 2020-01-24

## 2020-01-24 MED ADMIN — sterile water irrigation solution: @ 19:00:00 | Stop: 2020-01-24

## 2020-01-24 MED ADMIN — lactated Ringers infusion: INTRAVENOUS | @ 18:00:00 | Stop: 2020-01-24

## 2020-01-24 MED ADMIN — potassium chloride 10 mEq in 100 mL IVPB: 10 meq | INTRAVENOUS | @ 15:00:00 | Stop: 2020-01-24

## 2020-01-24 NOTE — Unmapped (Signed)
A/O x 4, VSS, O2 sats maintained on RA. NGT in place with suction.     PRN Zofran x1 to help with nausea, no PRN pain medication requested so far.     PIV maintained with IV fluid 134m/hr, potasium done, lab collected last night.     OR plan updated with patient and sister.     Patient plan to shower in the morning for OR.     Pt able to ambulate to bathroom by self.     All safety measures in place, will continue to monitor closely.          Problem: Adult Inpatient Plan of Care  Goal: Absence of Hospital-Acquired Illness or Injury  Intervention: Identify and Manage Fall Risk  Recent Flowsheet Documentation  Taken 01/23/2020 2000 by Jay Schlichter, RN  Safety Interventions:   low bed   fall reduction program maintained   aspiration precautions     Problem: Pain Acute  Goal: Acceptable Pain Control and Functional Ability  Outcome: Progressing     Problem: VTE (Venous Thromboembolism)  Goal: VTE (Venous Thromboembolism) Symptom Resolution  Outcome: Progressing  Intervention: Prevent or Manage VTE (Venous Thromboembolism)  Recent Flowsheet Documentation  Taken 01/24/2020 0400 by Jay Schlichter, RN  Anti-Embolism Device Type: SCD, Knee  Anti-Embolism Intervention: Refused  Anti-Embolism Device Location: BLE  Taken 01/24/2020 0200 by Jay Schlichter, RN  Anti-Embolism Device Type: SCD, Knee  Anti-Embolism Intervention: Refused  Anti-Embolism Device Location: BLE  Taken 01/24/2020 0000 by Jay Schlichter, RN  Anti-Embolism Device Type: SCD, Knee  Anti-Embolism Intervention: Refused  Anti-Embolism Device Location: BLE  Taken 01/23/2020 2200 by Jay Schlichter, RN  Anti-Embolism Device Type: SCD, Knee  Anti-Embolism Intervention: Refused  Anti-Embolism Device Location: BLE  Taken 01/23/2020 2000 by Jay Schlichter, RN  Anti-Embolism Device Type: SCD, Knee  Anti-Embolism Intervention: Refused  Anti-Embolism Device Location: BLE

## 2020-01-24 NOTE — Unmapped (Signed)
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 Pt has a surgical history of laparoscopic LAR and splenic flexure mobilization (06/26/2010), total thyroidectomy (06/25/2011), left lateral neck dissection (12/07/2012), right lateral neck dissection and partial parotidectomy for melanoma (03/29/2013), laparoscopic ventral hernia repair with mesh (04/07/14), open retrorectus ventral hernia repair with mesh (01/03/15), open primary ventral hernia repair with mesh explantation (06/21/16).       Admitted on 01/19/2020 with SBO, likely with transition at hernia sac.  Has continued to remain intermittently obstructed throughout admission.  Repeat CT scan performed yesterday showed concern for possible closed-loop bowel obstruction.  Per resident team last night, felt hernia was somewhat reduced on exam.    Given recurrent obstructions, will plan for operative intervention.  Will plan for exploratory laparotomy, possible bowel resection.  Discussed with patient and sister that will likely not plan of placing mesh at this time.    We discussed the risks (including, but not limited to bleeding, infection, pain, hernia recurence, damage to surrounding structures, injury to bowel, chronic pain, stroke, heart attack and death), benefits, and alternatives were discussed with the patient.  We discussed what would be involved with the surgical treatment of his hernia.  He was given time to ask questions and he appears to demonstrate understanding of the situation and agrees to proceed.  Consent has been signed and is in the chart.      Preoperative Surgical Risk Assessment Note    Patient Information:  Name: Verlyn Lambert [facility]  Age: 45 y.o.  Sex: male    Urgency    Emergency/Urgent case: Yes    *ASA: Class 4 - Severe systemic disease that is a constant threat to life   Systemic    *Steroid or immunosuppressant use for chronic condition: Yes    Systemic sepsis/system inflammatory response syndrome within 48hrs prior to surgery: None    BMI: Body mass index is 42.37 kg/m??. 40 and greater - Morbidly obese     Malnutrition: No     Pulmonary    *Ventilator Dependent: No    Dyspnea: No    *Active smoker or tobacco use within the past year: No    Chronic Respiratory Failure (requires continuous home O2): No     Cardiovascular    Congestive heart failure in 30 days prior to surgery: No    Recent history (within last 28 days) of Myocardial Infarction: No     Recent history (< 6 months) of percutaneous cardiac intervention, stenting or angina: No     Recent history (< 6 months) of peripheral vascular disease and/or rest pain: No      GI / Hepatic    *Ascites within 30 days prior to surgery: Yes    Liver cirrhosis: No   Renal    *Chronic Kidney Disease: No  EGFR CKD-EPI Non-African American Male   Date Value Ref Range Status   11/11/2019 81 >=60 mL/min/1.67m2 Final     EGFR CKD-EPI Non-African American, Male   Date Value Ref Range Status   01/24/2020 79 >=60 mL/min/1.21m2 Final     EGFR CKD-EPI African American Male   Date Value Ref Range Status   11/11/2019 >90 >=60 mL/min/1.30m2 Final     EGFR CKD-EPI African American,  Male   Date Value Ref Range Status   01/24/2020 >90 >=60 mL/min/1.77m2 Final       Dialysis for ESRD within 2 weeks prior to surgery: No    *Acute Renal Failure: Yes  Creatinine Whole Blood, POC   Date Value Ref Range Status   11/11/2019 1.1 0.8 - 1.4 mg/dL Final   29/52/8413 0.9 0.8 - 1.4 mg/dL Final     Creatinine   Date Value Ref Range Status   01/24/2020 1.13 (H) 0.60 - 1.10 mg/dL Final   24/40/1027 2.53 0.60 - 1.10 mg/dL Final   66/44/0347 4.25 (H) 0.60 - 1.10 mg/dL Final   95/63/8756 4.33 (H) 0.60 - 1.10 mg/dL Final   29/51/8841 6.60 0.60 - 1.10 mg/dL Final   63/01/6008 9.32 0.76 - 1.27 mg/dL Final   35/57/3220 2.54 (H) 0.76 - 1.27 mg/dL Final   27/06/2374 2.83 0.70 - 1.30 mg/dL Final   15/17/6160 7.37 0.70 - 1.30 mg/dL Final   10/62/6948 5.46 0.70 - 1.30 mg/dL Final        Endo / Heme    Diabetes: Type II, controlled with oral glycosides, without complications    *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     Dementia: No    Delirium: No     Recent history (< 6 months) of cerebrovascular accident and neurological deficit: No               Thomes Cake, MD, MPH  General and Acute Care Surgery  01/24/20 at 8:10 AM

## 2020-01-24 NOTE — Unmapped (Addendum)
Patient NPO. Planned for OR today.  IV calcium gluconate, magnesium sulfate, and potassium chloride replaced this AM. Lactated ringer bolus administered with infusions.   Voiding spontaneously. Family remains at the bedside.  Report given to pre-op RN. Patient went off unit at approximately 1230.

## 2020-01-24 NOTE — Unmapped (Signed)
[place] TRAUMA, ACUTE CARE, and GENERAL SURGERY   - Surgery Daily Progress Note -  01/24/2020       Admit Date: 01/18/2020, Hospital Day: 7  Hospital Service: SurgTrauma [facility])      Assessment     Kenneth Fitzgerald [facility] 45 y.o. 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  - PO home levothyroxine      Resp:   - Stable on RA   - Continue pulmonary toliet: OOB and IS     Fen/GI: SBO in the setting of radiation therapy, metastatic melanoma with carcinomatosis and liver involvement and intermittent obstructing ventral hernia  - Diet: NPO with NGT, high output overnight  - Fluids: medlock, bolus today 1L, IVF at 100  - Zofran as needed for nausea   - Replete lytes prn: hypokalemia, K for goal of 4 and Mag for goal of 2  - Hyperphosphatemia improving - 5.3 from 6.9  - Bowel regimen: on hold, NPO  - CT abdomen pelvis with closed loop bowel obstruction, plan for OR today with Dr. Carlynn Purl    *Chron's disease  -  Hold home sulfasalazine in the setting of bowel obstruction    GU: AKI on admission Cr peaked at 1.34 likely due to nausea, vomiting and poor PO intake in the setting of SBO (resolved)  - Adequate UOP.   - Voiding spontaneously  - Cr 1.13 from 1.09.     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   - Dexamethasone per onc, weaning down to 4mg  daily ending 1/10    ID:  - Afebrile.  - WBC- 9.2 from 12.3     PPx:   - Heparin     Dispo: Floor status  PT/OT: not indicated  Barriers to discharge: Nausea, SBO  CM/SW assisting in discharge planning.     Contact: [facility]  Kenneth Fitzgerald  [phone]      Subjective:   NAEO    Objective:     Vital signs in last 24 hours:  Temp:  [35.6 ??C (96.1 ??F)-37.1 ??C (98.8 ??F)] 36.5 ??C (97.7 ??F)  Heart Rate:  [97-108] 100  Resp:  [18] 18  BP: (135-164)/(90-109) 137/90  MAP (mmHg):  [109-131] 109  SpO2:  [96 %-98 %] 96 %    Intake/Output last 24 hours:  I/O last 3 completed shifts:  In: 2602.5 [P.O.:30; I.V.:2372.5; IV Piggyback:200]  Out: 3700 [Urine:1000; Emesis/NG output:2700]    Physical Exam:  -General:  Appropriate, comfortable and in no apparent distress.   -Neurological: Alert and oriented x3. Moves all 4 extremities spontaneously.   -Pulmonary: Normal work of breathing. No accessory muscle use.  -Abdomen: Soft, mildly distended, tender to palpation  midline at hernia- hernia feels soft and reduced, no rebound or guarding.  NGT with brown output.    -Genitourinary: Voiding spontaneously.   -Extremities: Warm, well perfused, normal skin turgor.    Data Review:    All lab results last 24 hours:    Recent Results (from the past 24 hour(s))   POCT Glucose    Collection Time: 01/23/20  2:04 PM   Result Value Ref Range    Glucose, POC 137 70 - 179 mg/dL   POCT Glucose    Collection Time: 01/23/20  6:26 PM   Result Value Ref Range    Glucose, POC 116 70 - 179 mg/dL   POCT Glucose    Collection Time: 01/23/20  9:10 PM   Result Value Ref Range    Glucose, POC 113 70 - 179 mg/dL   Basic Metabolic Panel    Collection Time: 01/23/20 11:08 PM   Result Value Ref Range    Sodium 140 135 - 145 mmol/L    Potassium 3.5 3.4 - 4.5 mmol/L    Chloride 107 98 - 107 mmol/L    CO2 26.0 20.0 - 31.0 mmol/L    Anion Gap 7 5 - 14 mmol/L    BUN 16 9 - 23 mg/dL Creatinine 1.61 0.96 - 1.10 mg/dL    BUN/Creatinine Ratio 15     EGFR CKD-EPI Non-African American, Male 44 >=60 mL/min/1.47m2    EGFR CKD-EPI African American, Male >90 >=60 mL/min/1.53m2    Glucose 120 70 - 179 mg/dL    Calcium 6.6 (L) 8.7 - 10.4 mg/dL   Lactate, Venous, Whole Blood    Collection Time: 01/23/20 11:08 PM   Result Value Ref Range    Lactate, Venous 1.1 0.5 - 1.8 mmol/L   Magnesium Level    Collection Time: 01/23/20 11:08 PM   Result Value Ref Range    Magnesium 1.9 1.6 - 2.6 mg/dL   Phosphorus Level    Collection Time: 01/23/20 11:08 PM   Result Value Ref Range    Phosphorus 5.5 (H) 2.4 - 5.1 mg/dL   Basic Metabolic Panel    Collection Time: 01/24/20  4:45 AM   Result Value Ref Range    Sodium 142 135 - 145 mmol/L    Potassium 3.1 (L) 3.4 - 4.5 mmol/L    Chloride 107 98 - 107 mmol/L    CO2 26.0 20.0 - 31.0 mmol/L    Anion Gap 9 5 - 14 mmol/L    BUN 16 9 - 23 mg/dL    Creatinine 0.45 (H) 0.60 - 1.10 mg/dL    BUN/Creatinine Ratio 14     EGFR CKD-EPI Non-African American, Male 63 >=60 mL/min/1.43m2    EGFR CKD-EPI African American, Male >90 >=60 mL/min/1.54m2    Glucose 130 70 - 179 mg/dL    Calcium 6.4 (L) 8.7 - 10.4 mg/dL   Magnesium Level    Collection Time: 01/24/20  4:45 AM   Result Value Ref Range    Magnesium 1.9 1.6 - 2.6 mg/dL   Phosphorus Level    Collection Time: 01/24/20  4:45 AM   Result Value Ref Range    Phosphorus 5.3 (H) 2.4 - 5.1 mg/dL   Type and Screen    Collection Time: 01/24/20  4:45 AM   Result Value Ref Range    ABO Grouping A POS     Antibody Screen NEG    PT-INR    Collection Time: 01/24/20  4:45 AM   Result Value Ref Range  PT 13.1 10.3 - 13.4 sec    INR 1.12    CBC w/ Differential    Collection Time: 01/24/20  4:45 AM   Result Value Ref Range    WBC 9.2 4.5 - 11.0 10*9/L    RBC 5.14 4.50 - 5.90 10*12/L    HGB 12.9 (L) 13.5 - 17.5 g/dL    HCT 57.8 46.9 - 62.9 %    MCV 82.5 80.0 - 100.0 fL    MCH 25.1 (L) 26.0 - 34.0 pg    MCHC 30.4 (L) 31.0 - 37.0 g/dL    RDW 52.8 (H) 41.3 - 15.0 %    MPV 10.8 (H) 7.0 - 10.0 fL    Platelet 272 150 - 440 10*9/L    Neutrophils % 81.4 %    Lymphocytes % 7.6 %    Monocytes % 8.5 %    Eosinophils % 1.2 %    Basophils % 0.4 %    Neutrophil Left Shift 1+ (A) Not Present    Absolute Neutrophils 7.5 2.0 - 7.5 10*9/L    Absolute Lymphocytes 0.7 (L) 1.5 - 5.0 10*9/L    Absolute Monocytes 0.8 0.2 - 0.8 10*9/L    Absolute Eosinophils 0.1 0.0 - 0.4 10*9/L    Absolute Basophils 0.0 0.0 - 0.1 10*9/L    Large Unstained Cells 1 0 - 4 %    Microcytosis Slight (A) Not Present    Anisocytosis Moderate (A) Not Present    Hypochromasia Moderate (A) Not Present   POCT Glucose    Collection Time: 01/24/20  6:32 AM   Result Value Ref Range    Glucose, POC 118 70 - 179 mg/dL         Imaging: CT reviewed      ATTENDING ATTESTATION:  I was the supervising physician in the delivery of the service.  ______________________________

## 2020-01-25 MED ADMIN — insulin regular (HumuLIN,NovoLIN) injection 0-12 Units: 0-12 [IU] | SUBCUTANEOUS | @ 18:00:00 | Stop: 2020-01-28

## 2020-01-25 MED ADMIN — enoxaparin (LOVENOX) syringe 40 mg: 40 mg | SUBCUTANEOUS | @ 03:00:00 | Stop: 2020-02-02

## 2020-01-25 MED ADMIN — acetaminophen (OFIRMEV) 10 mg/mL injection 1,000 mg: 1000 mg | INTRAVENOUS | @ 18:00:00 | Stop: 2020-01-28

## 2020-01-25 MED ADMIN — DULoxetine (CYMBALTA) DR capsule 30 mg: 30 mg | ORAL | @ 03:00:00 | Stop: 2020-02-02

## 2020-01-25 MED ADMIN — acetaminophen (OFIRMEV) 10 mg/mL injection 1,000 mg: 1000 mg | INTRAVENOUS | @ 23:00:00 | Stop: 2020-01-28

## 2020-01-25 MED ADMIN — enoxaparin (LOVENOX) syringe 40 mg: 40 mg | SUBCUTANEOUS | @ 14:00:00 | Stop: 2020-02-02

## 2020-01-25 MED ADMIN — acetaminophen (OFIRMEV) 10 mg/mL injection 1,000 mg: 1000 mg | INTRAVENOUS | @ 11:00:00 | Stop: 2020-01-28

## 2020-01-25 MED ADMIN — levothyroxine (SYNTHROID) tablet 375 mcg: 375 ug | ORAL | @ 14:00:00 | Stop: 2020-01-25

## 2020-01-25 MED ADMIN — DULoxetine (CYMBALTA) DR capsule 30 mg: 30 mg | ORAL | @ 14:00:00 | Stop: 2020-02-02

## 2020-01-25 MED ADMIN — acetaminophen (OFIRMEV) 10 mg/mL injection 1,000 mg: 1000 mg | INTRAVENOUS | @ 06:00:00 | Stop: 2020-01-28

## 2020-01-25 NOTE — Unmapped (Signed)
Encounter addended by: Mamie Nick, MD on: 01/25/2020 10:15 AM   Actions taken: Clinical Note Signed

## 2020-01-25 NOTE — Unmapped (Addendum)
OPERATIVE REPORT     ?   PATIENT NAME: Kenneth Fitzgerald [facility]        DOB: 1975/11/08 AGE:45 y.o.   MRN: [id number]  ?   DATE OF SURGERY: 01/24/2020   ADMIT DATE: 01/18/2020  PERFORMING SERVICE: [facility] - General, Acute Care, and Trauma Surgery  OPERATING SITE: [facility] [place]  ?   ?   PREOPERATIVE DIAGNOSIS: Ventral hernia.   ?   POSTOPERATIVE DIAGNOSIS:  recurrent incarcerated ventral hernia; metastatic melanoma  ?     PROCEDURE:   1. Open PRIMARY Repair of recurrent incarcerated ventral and incisional hernia  2. Lysis of adhesions for approximately:  (CPT  44005)  3. Resection of skin and subcutaneous tissue (CPT  15839)  4. Placment of negative pressure Prevena wound vac  5. Foley catheter placement      Surgeon(s) and Role:     * Adrien Shankar Nathanial Rancher, MD - Primary     * Rosaria Ferries, MD - Resident - Assisting     * Jens Som, MD - Resident - Assisting  None      ANESTHESIOLOGIST: Zoe Lan, MD    ANESTHESIA: General endotracheal anesthesia    INTRAVENOUS FLUIDS: per anesthesia records  ?   ESTIMATED BLOOD LOSS: 95 mL  ?   DRAINS: 15-French blake drain in subcutaneous tissues    IMPLANTS:  * No implants in log *  ?   SPECIMENS:   ID Type Source Tests Collected by Time Destination   1 : Peritoneal implant Tissue Abdomen SURGICAL PATHOLOGY EXAM [name], MD 01/24/2020 1511    2 : Hernia sac Tissue Abdomen SURGICAL PATHOLOGY EXAM [name], MD 01/24/2020 1525      ?   COMPLICATIONS: none    OPERATIVE FINDINGS: Melanoma implants within peritoneum and mesentery.  Chronically dilated SB from jejunum to proximal ileum.  Incarcerated small bowel appeared viable.  Retained mesh at the lateral abdomen without any appreciable mesh in the midline.  ?     OPERATIVE INDICATIONS: Kenneth Fitzgerald is a 45 y.o. 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   He was Admitted on 01/19/2020 with SBO, likely with transition at hernia sac.  Has continued to remain intermittently obstructed throughout admission.  Repeat CT scan performed yesterday showed concern for possible closed-loop bowel obstruction.  Per resident team last night, felt hernia was somewhat reduced on exam.  Given recurrent obstructions, will plan for operative intervention.  Will plan for exploratory laparotomy, possible bowel resection.  Discussed with patient and sister that will likely not plan of placing mesh at this time.   He understood the natural history of his disease.  The risks, benefits, and outcomes of the procedure were discussed with him  in detail. He understood and wished to proceed.     ?   OPERATIVE PROCEDURE: The patient was correctly identified before the procedure and informed consent was reviewed. The patient was taken to the operating room and placed on the operating table in supine position with a safety belt fastened across the thighs.  An initial timeout was performed with members of the surgical and anesthesia teams present.  Preoperative antibiotics was insured to have been given.  SCDs were applied. General endotracheal anesthesia was initiated.  Appropriate peripheral IV lines and foley catheter were placed. The patient was positioned Supine - arms out with all appropriate padding and secured to the table.  He was prepped with ChloraPrep and draped in the usual sterile fashion.   A second timeout was performed with all members of the surgical and Anesthesia teams present.     We then began with a vertical midline incision, starting in the upper midline just above the hernia. We entered the abdomen sharply and performed extensive adhesiolysis. We freed up the anterior abdominal wall, sharply taking down the bowel and omental adhesions for approximately 90 minutes.     We then ran the bowel and and had no enterotomies. We did have some serotomies which were repaired with 3-0 vicryl lembert stitches. The serotomies were serosal tears which were unavoidable and an inherent part of the procedure because of prior surgeries and scar tissue. .  We tucked some omentum over the bowel, and placed a moistened countable towel over the bowel.  We then excised hernia sac, scar tissue, and ratty midline fascia, and cleared off 2 cm of subcutaneous tissue from the healthy anterior fascia, to ensure appropriate tissue bites for our midline closure.  The countable towel was removed.  We closed the midline with interrupted #0 PDS sutures in figure-of-eight configurations. A  15-french JP drain was placed in the subcutaneous space.  The wound was irrigated and the skin was closed with staples.  A negative pressure prevena wound vac was applied.  An abdominal binder was applied.        Instrument, sponge, and needle counts were correct at the conclusion of the case.       CONDITION & DISPOSITION: PACU - extubated and hemodynamically stable    POST-OP PLANS & INSTRUCTIONS:  general surgery floor     TEACHING SURGEON ATTESTATION:  Please note, Dr. Thomes Cake, the attending surgeon was present and scrubbed for the entire procedure.           Foster Simpson, MD, MPH

## 2020-01-25 NOTE — Unmapped (Signed)
[facility], ACUTE CARE, and GENERAL SURGERY   - Surgery Daily Progress Note -  01/25/2020       Admit Date: 01/18/2020, Hospital Day: 8  Hospital [facility]      Assessment     Kenneth Fitzgerald [facility] 45 y.o. 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 He underwent primary open ventral hernia repair with Dr. Carlynn Purl on 01/24/20    Plan     Neuro:   - Acute Pain: controlled with dPCA, IV tylenol, oxycodone prn, IV dilaudid for breakthrough pain.   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 Carlynn Purl  - Diet: NPO with NGT, PICC and TPN pending.  Likely will start TPN tomorrow after patient has picc placed  - Fluids: mIVF at 100  - Zofran as needed for nausea   - Replete lytes [id number] today  - Bowel regimen: on hold, NPO  - continue provena wound vac until POD5-7.     *Chron's disease  -  Hold home sulfasalazine to await return of bowel function    GU: AKI on admission Cr peaked at 1.34 likely due to nausea, vomiting and poor PO intake in the setting of SBO (resolved)  - Adequate UOP.   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  Half the dose of the PO form.    Heme: metastatic melanoma with carcinomatosis and liver involvement  - Hgb stable   - oncology consulted and recommended to continue home dabrafenib/trametinib  - Radiation oncology also consulted and they will do treatments if possible.   - Dexamethasone per onc, weaning down to 4mg  daily ending 1/10    ID:  - Afebrile.  - WBC- pending today    PPx:   - Heparin     Dispo: Floor status  PT/OT: not indicated  Barriers to discharge: Nausea, SBO  CM/SW assisting in discharge planning.     Contact: [facility]  [name]  [phone]      Subjective:   NAEO.  He reports a small area of pain to the back of his head on the left hand side.     Objective:     Vital signs in last 24 hours:  Temp:  [36 ??C (96.8 ??F)-37.1 ??C (98.8 ??F)] 36 ??C (96.8 ??F)  Heart Rate:  [91-123] 106  SpO2 Pulse:  [107-121] 121  Resp:  [12-27] 18  BP: (114-144)/(81-97) 126/84  MAP (mmHg):  [98-112]  98  SpO2:  [94 %-99 %] 96 %    Intake/Output last 24 hours:  I/O last 3 completed shifts:  In: 2845 [I.V.:2645; IV Piggyback:200]  Out: 5002 [Urine:2305; Emesis/NG output:2550; Drains:52; Blood:95]    Physical Exam:  -General:  Appropriate, comfortable and in no apparent distress.   -HEENT: no erythema, laceration, abrasion or drainage.   -Neurological: Alert and oriented x3. Moves all 4 extremities spontaneously.   -Pulmonary: Normal work of breathing. No accessory muscle use.  -Abdomen: Soft, mildly distended, tender to palpation midline at hernia- hernia feels soft and reduced, no rebound or guarding.  NGT with brown output.    -Genitourinary: Voiding spontaneously.   -Extremities: Warm, well perfused, normal skin turgor.    Data Review:    All lab results last 24 hours:    Recent Results (from the past 24 hour(s))   POCT Glucose    Collection Time: 01/24/20 11:40 AM   Result Value Ref Range    Glucose, POC 94 70 - 179 mg/dL   Basic Metabolic Panel Collection Time: 01/24/20  1:47 PM   Result Value Ref Range    Sodium 140 135 - 145 mmol/L    Potassium 3.6 3.4 - 4.5 mmol/L    Chloride 103 98 - 107 mmol/L    CO2 26.0 20.0 - 31.0 mmol/L    Anion Gap 11 5 - 14 mmol/L    BUN 16 9 - 23 mg/dL    Creatinine 1.61 0.96 - 1.10 mg/dL    BUN/Creatinine Ratio 17     EGFR CKD-EPI Non-African American, Male >90 >=60 mL/min/1.90m2    EGFR CKD-EPI African American, Male >90 >=60 mL/min/1.61m2    Glucose 115 70 - 179 mg/dL    Calcium 6.4 (L) 8.7 - 10.4 mg/dL   Magnesium Level    Collection Time: 01/24/20  1:47 PM   Result Value Ref Range    Magnesium 2.2 1.6 - 2.6 mg/dL   Phosphorus Level    Collection Time: 01/24/20  1:47 PM   Result Value Ref Range    Phosphorus 5.3 (H) 2.4 - 5.1 mg/dL   POCT Glucose    Collection Time: 01/24/20  5:13 PM   Result Value Ref Range    Glucose, POC 165 70 - 179 mg/dL   POCT Glucose    Collection Time: 01/24/20  9:24 PM   Result Value Ref Range    Glucose, POC 140 70 - 179 mg/dL   POCT Glucose    Collection Time: 01/25/20  6:35 AM   Result Value Ref Range    Glucose, POC 144 70 - 179 mg/dL         Imaging: none      ATTENDING ATTESTATION:  I was the supervising physician in the delivery of the service.  ______________________________

## 2020-01-25 NOTE — Unmapped (Shared)
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 He is POD1 following an open primary repair of recurrent incarcerated ventral and incisional hernia and placement of negative pressure Prevena wound vac. Patient states that his pain is well-tolerated on current medication regimen. He reports a sore spot on the back side of his head.     Overnight events-   No acute events overnight per report by PACU RN.     Objective:  Vital signs-  Temp: 36.5 C (97.7 F)  HR: 109  Resp: 18  BP: 130/84  SpO2: 95%    Ins & Outs-  Ins: 2000 IV  Outs: 3402 (urine: 2305; emesis/NG: 950; drains: 52; blood: 95)    Labs-  Unable to obtain blood sample.     Physical Exam-   General: Patient alert and oriented x3  Neuro: No focal neural deficits noted. CN intact. Sensation intact on bilateral lower extremities  Skin: No signs of pressure sore on posterior scalp  CV: Regular rate and rhythm. No murmurs, gallops or rubs.   Respiratory: Lungs clear to auscultation bilaterally. No adventitious sounds noted. Normal work of breathing.   GI: Mild abdominal distention. Tenderness to palpation in LLQ. Wound vac without complications. Serous fluid noted in JP drain  GU: No gross hematuria noted for foley output. Adequate urine output noted.    Assessment:  In summary, Kenneth Fitzgerald is a 45 year old male POD1 following exploratory laparotomy and open primary repair of recurrent incarcerated ventral hernia followed by wound vac placement. Patient is mildly tachycardic most likely secondary to pain or decreased fluid intake following surgery.     Plan:  Neuro: Continue current treatment regimen for pain. Maintain fall reduction protocol.   Skin: Continue to monitor posterior scalp or any skin changes  CV: Continue to monitor vitals  Respiratory: Continue to monitor vitals  GI: Continue NPO sips with medications. Maintain NG tube. Monitor for return of bowel function.   GU: Plan to remove foley catheter this afternoon.  Heme: Continue DVT prophylaxis. Plan to re-attempt obtaining blood sample

## 2020-01-25 NOTE — Unmapped (Signed)
Report received from PACU RN. Pt arrived alert and oriented x4, VSS and afebrile. Continuing on NPO diet. NS w/ 20 potassium infusing @ 100 ml/hr. Dilaudid PCA set to 0.3/10/7.6. Full sensation +2 pulses to all extremities. NGT drain remains to suction with minimal output. Jp also to suction with minimal output. WV running at a continuous -125 with no output. Fall reduction protocol maintained, pt remains absent of falls and injury throughout the shift. Pt able to turn/reposition independently. Adequate urine output noted in foley. No s/s of skin breakdown observed. Pt currently resting in bed without complaints, will continue to monitor.       Problem: Adult Inpatient Plan of Care  Goal: Absence of Hospital-Acquired Illness or Injury  Intervention: Identify and Manage Fall Risk  Recent Flowsheet Documentation  Taken 01/25/2020 0400 by [place] A Jumaane Weatherford, RN  Safety Interventions:   low bed   lighting adjusted for tasks/safety   fall reduction program maintained  Taken 01/25/2020 0200 by [place] A Ladarrius Bogdanski, RN  Safety Interventions:   low bed   lighting adjusted for tasks/safety   fall reduction program maintained  Taken 01/25/2020 0000 by [place] A Eulala Newcombe, RN  Safety Interventions:   low bed   fall reduction program maintained   lighting adjusted for tasks/safety  Taken 01/24/2020 2200 by [place] A Ily Denno, RN  Safety Interventions:   low bed   lighting adjusted for tasks/safety   fall reduction program maintained  Intervention: Prevent and Manage VTE (Venous Thromboembolism) Risk  Recent Flowsheet Documentation  Taken 01/25/2020 0400 by [place] A Aren Pryde, RN  Activity Management: activity adjusted per tolerance  Taken 01/25/2020 0200 by [place] A Cammy Sanjurjo, RN  Activity Management: activity adjusted per tolerance  Taken 01/25/2020 0000 by [place] A Thursa Emme, RN  Activity Management: activity adjusted per tolerance  Taken 01/24/2020 2200 by [place] A Jlee Harkless, RN  Activity Management: activity adjusted per tolerance     Problem: Impaired Wound Healing  Goal: Optimal Wound Healing  Intervention: Promote Wound Healing  Recent Flowsheet Documentation  Taken 01/25/2020 0400 by [place] A Macaila Tahir, RN  Activity Management: activity adjusted per tolerance  Taken 01/25/2020 0200 by [place] A Vaishnav Demartin, RN  Activity Management: activity adjusted per tolerance  Taken 01/25/2020 0000 by [place] A Ayo Guarino, RN  Activity Management: activity adjusted per tolerance  Taken 01/24/2020 2200 by [place] A Amayrany Cafaro, RN  Activity Management: activity adjusted per tolerance     Problem: Pain Acute  Goal: Acceptable Pain Control and Functional Ability  Outcome: Progressing     Problem: VTE (Venous Thromboembolism)  Goal: VTE (Venous Thromboembolism) Symptom Resolution  Outcome: Progressing  Intervention: Prevent or Manage VTE (Venous Thromboembolism)  Recent Flowsheet Documentation  Taken 01/25/2020 0400 by [place] Poetry Cerro, RN  Anti-Embolism Device Type: SCD, Knee  Anti-Embolism Intervention: On  Anti-Embolism Device Location: BLE  Taken 01/25/2020 0200 by [place] A Delylah Stanczyk, RN  Anti-Embolism Device Type: SCD, Knee  Anti-Embolism Intervention: On  Anti-Embolism Device Location: BLE  Taken 01/25/2020 0000 by [place] A Amy Gothard, RN  Anti-Embolism Device Type: SCD, Knee  Anti-Embolism Intervention: On  Anti-Embolism Device Location: BLE  Taken 01/24/2020 2200 by [place] A Mariea Mcmartin, RN  Anti-Embolism Device Type: SCD, Knee  Anti-Embolism Intervention: On  Anti-Embolism Device Location: BLE

## 2020-01-26 DIAGNOSIS — C439 Malignant melanoma of skin, unspecified: Principal | ICD-10-CM

## 2020-01-26 LAB — CBC
HEMATOCRIT: 36.7 % — ABNORMAL LOW (ref 41.0–53.0)
HEMOGLOBIN: 11.1 g/dL — ABNORMAL LOW (ref 13.5–17.5)
MEAN CORPUSCULAR HEMOGLOBIN CONC: 30.3 g/dL — ABNORMAL LOW (ref 31.0–37.0)
MEAN CORPUSCULAR HEMOGLOBIN: 25.3 pg — ABNORMAL LOW (ref 26.0–34.0)
MEAN CORPUSCULAR VOLUME: 83.5 fL (ref 80.0–100.0)
MEAN PLATELET VOLUME: 10.9 fL — ABNORMAL HIGH (ref 7.0–10.0)
PLATELET COUNT: 290 10*9/L (ref 150–440)
RED BLOOD CELL COUNT: 4.39 10*12/L — ABNORMAL LOW (ref 4.50–5.90)
RED CELL DISTRIBUTION WIDTH: 19.1 % — ABNORMAL HIGH (ref 12.0–15.0)
WBC ADJUSTED: 10.6 10*9/L (ref 4.5–11.0)

## 2020-01-26 LAB — BASIC METABOLIC PANEL
ANION GAP: 14 mmol/L (ref 5–14)
ANION GAP: 12 mmol/L (ref 5–14)
BLOOD UREA NITROGEN: 14 mg/dL (ref 9–23)
BLOOD UREA NITROGEN: 12 mg/dL (ref 9–23)
BUN / CREAT RATIO: 12
BUN / CREAT RATIO: 10
CALCIUM: 5.6 mg/dL — CL (ref 8.7–10.4)
CALCIUM: 5.5 mg/dL — CL (ref 8.7–10.4)
CHLORIDE: 107 mmol/L (ref 98–107)
CHLORIDE: 106 mmol/L (ref 98–107)
CO2: 25 mmol/L (ref 20.0–31.0)
CO2: 24 mmol/L (ref 20.0–31.0)
CREATININE: 1.2 mg/dL — ABNORMAL HIGH
CREATININE: 1.18 mg/dL — ABNORMAL HIGH
EGFR CKD-EPI AA MALE: 85 mL/min/{1.73_m2} (ref >=60)
EGFR CKD-EPI AA MALE: 86 mL/min/{1.73_m2} (ref >=60)
EGFR CKD-EPI NON-AA MALE: 73 mL/min/{1.73_m2} (ref >=60)
EGFR CKD-EPI NON-AA MALE: 75 mL/min/{1.73_m2} (ref >=60)
GLUCOSE RANDOM: 125 mg/dL (ref 70–179)
GLUCOSE RANDOM: 113 mg/dL (ref 70–179)
POTASSIUM: 3.8 mmol/L (ref 3.4–4.5)
POTASSIUM: 3.7 mmol/L (ref 3.4–4.5)
SODIUM: 146 mmol/L — ABNORMAL HIGH (ref 135–145)
SODIUM: 142 mmol/L (ref 135–145)

## 2020-01-26 LAB — CBC W/ AUTO DIFF
BASOPHILS ABSOLUTE COUNT: 0 10*9/L (ref 0.0–0.1)
BASOPHILS RELATIVE PERCENT: 0.4 %
EOSINOPHILS ABSOLUTE COUNT: 0.3 10*9/L (ref 0.0–0.4)
EOSINOPHILS RELATIVE PERCENT: 2.4 %
HEMATOCRIT: 39.1 % — ABNORMAL LOW (ref 41.0–53.0)
HEMOGLOBIN: 11.8 g/dL — ABNORMAL LOW (ref 13.5–17.5)
LARGE UNSTAINED CELLS: 1 % (ref 0–4)
LYMPHOCYTES ABSOLUTE COUNT: 0.8 10*9/L — ABNORMAL LOW (ref 1.5–5.0)
LYMPHOCYTES RELATIVE PERCENT: 6.3 %
MEAN CORPUSCULAR HEMOGLOBIN CONC: 30.2 g/dL — ABNORMAL LOW (ref 31.0–37.0)
MEAN CORPUSCULAR HEMOGLOBIN: 25.3 pg — ABNORMAL LOW (ref 26.0–34.0)
MEAN CORPUSCULAR VOLUME: 83.9 fL (ref 80.0–100.0)
MEAN PLATELET VOLUME: 9.4 fL (ref 7.0–10.0)
MONOCYTES ABSOLUTE COUNT: 0.6 10*9/L (ref 0.2–0.8)
MONOCYTES RELATIVE PERCENT: 5 %
NEUTROPHILS ABSOLUTE COUNT: 10.1 10*9/L — ABNORMAL HIGH (ref 2.0–7.5)
NEUTROPHILS RELATIVE PERCENT: 85 %
PLATELET COUNT: 301 10*9/L (ref 150–440)
RED BLOOD CELL COUNT: 4.66 10*12/L (ref 4.50–5.90)
RED CELL DISTRIBUTION WIDTH: 18.9 % — ABNORMAL HIGH (ref 12.0–15.0)
WBC ADJUSTED: 11.9 10*9/L — ABNORMAL HIGH (ref 4.5–11.0)

## 2020-01-26 LAB — PHOSPHORUS
PHOSPHORUS: 5.5 mg/dL — ABNORMAL HIGH (ref 2.4–5.1)
PHOSPHORUS: 5.6 mg/dL — ABNORMAL HIGH (ref 2.4–5.1)

## 2020-01-26 LAB — MAGNESIUM
MAGNESIUM: 2 mg/dL (ref 1.6–2.6)
MAGNESIUM: 1.9 mg/dL (ref 1.6–2.6)

## 2020-01-26 LAB — SLIDE REVIEW

## 2020-01-26 MED ADMIN — dextrose 5 % and sodium chloride 0.9 % with KCl 20 mEq/L infusion: 125 mL/h | INTRAVENOUS | @ 19:00:00 | Stop: 2020-01-27

## 2020-01-26 MED ADMIN — phenoL (CHLORASEPTIC) 1.4 % spray 2 spray: 2 | @ 03:00:00 | Stop: 2020-02-02

## 2020-01-26 MED ADMIN — enoxaparin (LOVENOX) syringe 40 mg: 40 mg | SUBCUTANEOUS | @ 14:00:00 | Stop: 2020-02-02

## 2020-01-26 MED ADMIN — acetaminophen (OFIRMEV) 10 mg/mL injection 1,000 mg: 1000 mg | INTRAVENOUS | Stop: 2020-01-28

## 2020-01-26 MED ADMIN — acetaminophen (OFIRMEV) 10 mg/mL injection 1,000 mg: 1000 mg | INTRAVENOUS | @ 07:00:00 | Stop: 2020-01-28

## 2020-01-26 MED ADMIN — sodium chloride (NS) 0.9 % flush 10 mL: 10 mL | INTRAVENOUS | Stop: 2020-02-02

## 2020-01-26 MED ADMIN — enoxaparin (LOVENOX) syringe 40 mg: 40 mg | SUBCUTANEOUS | @ 02:00:00 | Stop: 2020-02-02

## 2020-01-26 MED ADMIN — potassium chloride 10 mEq in 100 mL IVPB: 10 meq | INTRAVENOUS | @ 13:00:00 | Stop: 2020-01-26

## 2020-01-26 MED ADMIN — DULoxetine (CYMBALTA) DR capsule 30 mg: 30 mg | ORAL | @ 02:00:00 | Stop: 2020-02-02

## 2020-01-26 MED ADMIN — levothyroxine (SYNTHROID) injection: 188 ug | INTRAVENOUS | Stop: 2020-01-31

## 2020-01-26 MED ADMIN — potassium chloride 10 mEq in 100 mL IVPB: 10 meq | INTRAVENOUS | @ 15:00:00 | Stop: 2020-01-26

## 2020-01-26 MED ADMIN — DULoxetine (CYMBALTA) DR capsule 30 mg: 30 mg | ORAL | @ 14:00:00 | Stop: 2020-02-02

## 2020-01-26 MED ADMIN — acetaminophen (OFIRMEV) 10 mg/mL injection 1,000 mg: 1000 mg | INTRAVENOUS | @ 12:00:00 | Stop: 2020-01-28

## 2020-01-26 MED ADMIN — acetaminophen (OFIRMEV) 10 mg/mL injection 1,000 mg: 1000 mg | INTRAVENOUS | @ 17:00:00 | Stop: 2020-01-28

## 2020-01-26 MED ADMIN — pantoprazole (PROTONIX) injection 40 mg: 40 mg | INTRAVENOUS | @ 19:00:00 | Stop: 2020-02-02

## 2020-01-26 NOTE — Unmapped (Signed)
NGT to LCWS. Voiding. Ambulated in hallway x1 during the shift. Abdominal dressing with provena wound vac intact. Abdominal binder in place. NPO maintained. No acute events overnight. All safety measures in place. Will continue to monitor  Problem: Pain Acute  Goal: Acceptable Pain Control and Functional Ability  Outcome: Progressing     Problem: VTE (Venous Thromboembolism)  Goal: VTE (Venous Thromboembolism) Symptom Resolution  Outcome: Progressing  Intervention: Prevent or Manage VTE (Venous Thromboembolism)  Recent Flowsheet Documentation  Taken 01/26/2020 0200 by Tresa Res, RN  Anti-Embolism Device Type: SCD, Knee  Anti-Embolism Intervention: On  Anti-Embolism Device Location: BLE  Taken 01/26/2020 0000 by Tresa Res, RN  Anti-Embolism Device Type: SCD, Knee  Anti-Embolism Intervention: On  Anti-Embolism Device Location: BLE  Taken 01/25/2020 2200 by Tresa Res, RN  Anti-Embolism Device Type: SCD, Knee  Anti-Embolism Intervention: On  Anti-Embolism Device Location: BLE  Taken 01/25/2020 2100 by Tresa Res, RN  Anti-Embolism Device Type: SCD, Knee  Anti-Embolism Intervention: On  Anti-Embolism Device Location: BLE  Taken 01/25/2020 2000 by Tresa Res, RN  Anti-Embolism Device Type: SCD, Knee  Anti-Embolism Intervention: On  Anti-Embolism Device Location: BLE

## 2020-01-26 NOTE — Unmapped (Signed)
No acute changes this shift. VSS. NGT remains in place to LCWS. Pain managed with dilaudid PCA. Foley removed this afternoon. TOV in progress. All safety measures in place.    Problem: Pain Acute  Goal: Acceptable Pain Control and Functional Ability  Outcome: Progressing     Problem: VTE (Venous Thromboembolism)  Goal: VTE (Venous Thromboembolism) Symptom Resolution  Outcome: Progressing  Intervention: Prevent or Manage VTE (Venous Thromboembolism)  Recent Flowsheet Documentation  Taken 01/25/2020 0800 by Berneice Gandy, RN  Anti-Embolism Device Type: SCD, Knee  Anti-Embolism Intervention: On  Anti-Embolism Device Location: BLE

## 2020-01-26 NOTE — Unmapped (Addendum)
Adult Nutrition Progress Note    Visit Type: MD Consult  Reason for Visit: Parenteral Nutrition      Nutrition Progress:  Patient now NPO/Clears day 8. Had one day trial of regular diet, however experience multiple bouts of emesis. Now with NG to suction, ~1100 mL output overnight. Asked RN for updated weight, noted significant weight loss (7%) over course of admission.  Completed remotely.    Anthropometric Data:  Height: 188 cm (6' 2)   Admission weight: (!) 149.7 kg (330 lb)  Last recorded weight: (!) 140.1 kg (308 lb 12.8 oz)  IBW: 86.23 kg  Percent IBW: 173.59 %  BMI: Body mass index is 39.65 kg/m??.   Usual Body Weight: Unable to obtain at this time     Weight history prior to admission:   Wt Readings from Last 10 Encounters:   01/26/20 (!) 140.1 kg (308 lb 12.8 oz)   01/13/20 (!) 149.3 kg (329 lb 1.6 oz)   01/03/20 (!) 150.4 kg (331 lb 9.2 oz)   01/03/20 (!) 150.4 kg (331 lb 9.6 oz)   12/23/19 (!) 147.1 kg (324 lb 4.8 oz)   11/25/19 (!) 144.9 kg (319 lb 8 oz)   11/11/19 (!) 144.4 kg (318 lb 4.8 oz)   09/30/19 (!) 141.4 kg (311 lb 12.8 oz)   08/30/19 (!) 141 kg (310 lb 14.4 oz)   08/24/19 (!) 142 kg (313 lb)        Weight changes this admission:   Last 5 Recorded Weights    01/20/20 1748 01/26/20 1030   Weight: (!) 149.7 kg (330 lb) (!) 140.1 kg (308 lb 12.8 oz)        Nutrition Focused Physical Exam:    Nutrition Evaluation  Overall Impressions: Nutrition-Focused Physical Exam not indicated due to lack of malnutrition risk factors. (01/22/20 1543)  Nutrition Designation: Obese class III  (BMI > 39.99 kg/m2) (01/22/20 1543)      NUTRITIONALLY RELEVANT DATA     Medications:   Nutritionally pertinent medications reviewed and evaluated for potential food and/or medication interactions.   TPN Order:   Recent TPN Orders (Show up to 1 orders ; newest on the right.)     Start date and time 01/28/2020 0000       Adult 3-in-1 PN [[id number]]     Order Status Active        Macro Ingredients    amino acid (CLINISOL SF) 15% 100 g     dextrose 150 g        Electrolytes    potassium chloride 40 mEq     potassium phosphate 15 mmol     magnesium sulfate dilution 8 mEq        Additives    multivitamin, adult injection 10 mL     trace elements (TRALEMENT) Zn-Cu-Mn-Se 1 mL        Medications    thiamine 200 mg        QS Base    sterile water 7.1 mL        Lipids    fat emulsion 20 % 30 g        Energy Contribution    Proteins 400 kcal     Dextrose 510 kcal     Lipids 300 kcal     Total 1,210 kcal        Electrolyte Ion Calculated Amount    Sodium --     Potassium 62 mEq     Calcium --  Magnesium 8 mEq     Aluminum --     Phosphate 15 mmol     Chloride 40 mEq     Acetate --        Other    Total Amino Acid 100 g     Total Amino Acid/kg 0.71 g/kg     Glucose Infusion Rate 0.74 mg/kg/min     Osmolarity (Estimated) 1,768.61     Volume 1,080 mL     Rate 45 mL/hr     Dosing Weight 140.1 kg     Infusion Site [facility]        Admin Instructions      Please infuse using a parenteral nutrition tubing set with a 1.2-micron filter           Labs:     Results in Past 7 Days  Result Component Current Result   Sodium 147 (H) (01/27/2020)   Potassium 3.7 (01/27/2020)   Chloride 111 (H) (01/27/2020)   CO2 25.0 (01/27/2020)   BUN 12 (01/27/2020)   Creatinine 1.06 (01/27/2020)   Glucose 132 (01/27/2020)   Calcium 5.5 (LL) (01/27/2020)   Ionized Calcium Not in Time Range   Magnesium 2.0 (01/27/2020)   Phosphorus 4.8 (01/27/2020)   Albumin 2.7 (L) (01/27/2020)   Total Bilirubin 0.4 (01/27/2020)   Bilirubin, Direct 0.20 (01/27/2020)   AST 58 (H) (01/27/2020)   ALT 56 (H) (01/27/2020)   Alkaline Phosphatase 102 (01/27/2020)   Triglycerides 200 (H) (01/27/2020)   Glucose, POC 104 (01/27/2020)        Nutrition History:   01/27/20: PICC placed 01/12 evening. Plan to start TPN tonight.   01/26/20: NPO/Clears day 8. Plan to start TPN tonight. Informed by VAT that PICC would likely not be placed on 01/12.   January 22, 2020: Prior to admission: Patient unable to provide at this time. Patient started to discuss his nutrition hx but was unable to proceed due to starting to vomit in bag. Patient confirmed recent weight gain due to steroids. Per RN, patient started the day on Regular diet, began to vomit; was transitioned to clear liquid, again vomiting. Now NPO with sips. Patient was anticipated discharge pending tolerance to meal yesterday. Patient given Zofran and may start on Phenergan; possible NGT placement pending. Loose BMs and passing flatus per notes.    Allergies, Intolerances, Sensitivities, and/or Cultural/Religious Dietary Restrictions: include coconut    Current Nutrition:  NPO  Parenteral nutrition via PICC   Placement Date: 01/26/20  Placed by: Venous Access Team  Double lumen  Tip located in the Appropriate Tip Location Confirmed by [employer] and [employer] for use by the Venous Access Team (VAT)        Nutrition Orders   (From admission, onward)             Start     Ordered    01/22/20 1411  NPO Sips with meds; Medically necessary  Effective now        Question Answer Comment   NPO Except: Sips with meds    Reason: Medically necessary        01/22/20 1410                   Nutritional Needs:   Daily Estimated Nutrient Needs:  Energy: [phone] kcal/day (BMR x 1.1-1.2) kcals Per Miffl[employer] St-Jeor Equation using last recorded weight, 150 kg (01/22/20 1552)]  Protein: 135-148 gm/day gm [other (comment) (20% of kcal) using last recorded weight, 150 kg (01/22/20 1552)]  Carbohydrate:   [no restriction]  Fluid:   mL [1 mL/kcal (maintenance)]        Malnutrition Assessment using AND/ASPEN Clinical Characteristics:    Severe Protein-Calorie Malnutrition in the context of acute illness or injury (01/26/20 1033)  Energy Intake: < or equal to 50% of estimated energy requirement of > or equal to 5 days  Interpretation of Wt. Loss: > 2% x 1 week  Malnutrition Score: 2      GOALS and EVALUATION     ??? Patient to meet 100% or greater of nutritional needs via parenteral nutrition within 3-5 days.  - New    Motivation, Barriers, and Compliance:  Evaluation of motivation, barriers, and compliance completed and include clinical condition     NUTRITION ASSESSMENT     ??? Patient appropriate for parenteral nutrition based on the following ASPEN criteria of bowel obstruction.  ??? Patient would benefit from addition of 200 mg of thiamine in parenteral nutrition (PN) in order to help minimize effects of refeeding syndrome.   ??? Patient appropriate for use of IV multivitamins due to NPO status and inability to take po/enteral meds at this time.   ??? Patient will no longer need dextrose and K+ in IV fluids starting at midnight when parenteral nutrition (PN) starts.       Discharge Planning:   Monitor for potential discharge needs with multi-disciplinary team.       NUTRITION INTERVENTIONS and RECOMMENDATION     -Discontinue D5 with KCl at midnight  Daily BMP, magnesium, and phosphorus while on TPN until stable  Check triglycerides  Check hepatic panel    If home TPN services are anticipated, please contact the TPN team ([facility] in [place]) at least 72 hours prior to anticipated discharge in order to coordinate cycling for home discharge.      Follow-Up Parameters:   Monitor daily while on TPN    Lavella Lemons, MS, RD, LDN, CNSC  Pager: [phone]

## 2020-01-26 NOTE — Unmapped (Shared)
Assessment:  Kenneth Fitzgerald is a 45 y.o. 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 He is POD2 following a open primary ventral hernia repair.     Plan:   Neuro:  - Pain well-controlled IV dilaudid, acetaminophen and oxycodone prn.   HEENT:  - Continue to monitor area of tenderness on posterior scalp for signs of injury/pressure sore  CV:  - Continue to monitor vitals.  Respiratory:  - Continue to monitor SpO2 levels. Initiate incentive spirometry, use 10 times per hour    GI:  - AKI: increased fluids to mIVF 125, continue to monitor for BP changes    - Nutrition: continue NPO with NGT; insert PICC and initiate TPN; monitor for return of bowel function and transition to clear NG tube output    - Continue Zofran prn for nausea  - Continue Prevena wound vac until approximately POD7   GU:  - Continue to monitor urinary habits  Nephrology:  - AKI: continue to monitor Scr levels  - Give IV K and continue to monitor levels   Endocrine:  - DM: continue to hold metformin  Heme:   - Metastatic melanoma: stable, continue dabrafenib/trametinib  ID:  - Trend WBC count for signs of infection   Dispo:  - Maintain floor status and continue to monitor     Subjective:  No acute events overnight. Foley removed 01/25/2020 and patient reports uncomplicated voiding. He reports the area of tenderness on the back of his scalp is improving.     Objective:  Vitals:  Temp: 36.7 C  HR: 106  RR: 18  BP: 130/82  SpO2: 93%    Ins/Outs:  Ins: 0  Outs: 2990 [urine: 1910; emesis/NG output: 800; Drains: 280]  Net: -2990     Data reviewed: labs within the previous 24 hours    Physical Exam:  General- laying comfortably in no apparent distress  Neuro- alert and oriented  CV- regular rate and rhythm. NO murmurs, gallops or rubs noted  Respiratory- Normal work of breath. No adventitious sounds noted  Abdominal- tenderness to palpation in the LUQ. Improving serous fluid in surgical JP drain. Wound vac in place with no apparent complications. NGT tube producing brown output     Signed: Charlaine Dalton, PA-S

## 2020-01-26 NOTE — Unmapped (Signed)
[facility], [facility], and GENERAL SURGERY   - Surgery Daily Progress Note -  01/26/2020       Admit Date: 01/18/2020, Hospital Day: 9  Hospital Service: Kenneth Fitzgerald)      Assessment     Kenneth Fitzgerald Kenneth Fitzgerald 45 y.o. 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 He underwent primary open ventral hernia repair with Dr. Carlynn Purl on 01/24/20    Plan     Neuro:   - Acute Pain: controlled with dPCA, IV tylenol, oxycodone prn, IV dilaudid for breakthrough pain.   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 Carlynn Purl  - Diet: NPO with NGT, PICC and TPN pending.    - Fluids: mIVF at 125  - Zofran as needed for nausea   - Replete lytes ZOX:WRUEAVW today  - Bowel regimen: on hold, NPO  - continue provena wound vac until POD5-7.     *Chron's disease  -  Hold home sulfasalazine to await return of bowel function    GU: AKI on admission Cr peaked at 1.34 likely due to nausea, vomiting and poor PO intake in the setting of SBO (resolving)  - Adequate UOP.   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  Half the dose of the PO form.    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   - Dexamethasone per onc, weaning down to 4mg  daily ending 1/10  - Discussed with heme/onc today and no changes to the meds    ID:  - Afebrile.  - WBC- 10.6 from 11.6    PPx:   - Heparin     Dispo: Floor status  PT/OT: not indicated  Barriers to discharge: AROBF  CM/SW assisting in discharge planning.     Contact: [facility]  Kenneth Fitzgerald  [phone]      Subjective:   NAEO.   Reports some belching    Objective:     Vital signs in last 24 hours:  Temp:  [36.2 ??C (97.2 ??F)-36.9 ??C (98.4 ??F)] 36.2 ??C (97.2 ??F)  Heart Rate:  [99-116] 103  Resp:  [17-18] 17  BP: (125-143)/(61-84) 126/84  MAP (mmHg):  [88-102] 101  SpO2:  [93 %-98 %] 97 %    Intake/Output last 24 hours:  I/O last 3 completed shifts:  In: 0  Out: 4870 [Urine:2660; Emesis/NG output:1850; Drains:360]    Physical Exam:  -General:  Appropriate, comfortable and in no apparent distress.   -HEENT: no erythema, laceration, abrasion or drainage.   -Neurological: Alert and oriented x3. Moves all 4 extremities spontaneously.   -Pulmonary: Normal work of breathing. No accessory muscle use.  -Abdomen: Soft, mildly distended, tender to palpation midline at incision with provena vac in place and SS output.  Drain to LUQ with SS output. NGT with brown output.    -Genitourinary: Voiding spontaneously.   -Extremities: Warm, well perfused, normal skin turgor.    Data Review:    All lab results last 24 hours:    Recent Results (from the past 24 hour(s))   POCT Glucose    Collection Time: 01/25/20  5:00 PM   Result Value Ref Range    Glucose, POC 129 70 - 179 mg/dL   POCT Glucose    Collection Time: 01/25/20  9:22 PM   Result Value Ref Range    Glucose, POC 142 70 - 179 mg/dL   Basic Metabolic Panel    Collection Time: 01/26/20  4:56 AM Result Value Ref Range    Sodium 146 (H) 135 - 145 mmol/L    Potassium 3.8 3.4 - 4.5 mmol/L    Chloride 107 98 - 107 mmol/L    CO2 25.0 20.0 - 31.0 mmol/L    Anion Gap 14 5 - 14 mmol/L    BUN 14 9 - 23 mg/dL    Creatinine 2.44 (H) 0.60 - 1.10 mg/dL    BUN/Creatinine Ratio 12     EGFR CKD-EPI Non-African American, Male 12 >=60 mL/min/1.63m2    EGFR CKD-EPI African American, Male 31 >=60 mL/min/1.75m2    Glucose 125 70 - 179 mg/dL    Calcium 5.6 (LL) 8.7 - 10.4 mg/dL   Magnesium Level    Collection Time: 01/26/20  4:56 AM   Result Value Ref Range    Magnesium 2.0 1.6 - 2.6 mg/dL   Phosphorus Level    Collection Time: 01/26/20  4:56 AM   Result Value Ref Range    Phosphorus 5.5 (H) 2.4 - 5.1 mg/dL   CBC w/ Differential    Collection Time: 01/26/20  4:56 AM   Result Value Ref Range    WBC 11.9 (H) 4.5 - 11.0 10*9/L    RBC 4.66 4.50 - 5.90 10*12/L    HGB 11.8 (L) 13.5 - 17.5 g/dL    HCT 01.0 (L) 27.2 - 53.0 %    MCV 83.9 80.0 - 100.0 fL    MCH 25.3 (L) 26.0 - 34.0 pg    MCHC 30.2 (L) 31.0 - 37.0 g/dL    RDW 53.6 (H) 64.4 - 15.0 %    MPV 9.4 7.0 - 10.0 fL    Platelet 301 150 - 440 10*9/L    Neutrophils % 85.0 %    Lymphocytes % 6.3 %    Monocytes % 5.0 %    Eosinophils % 2.4 %    Basophils % 0.4 %    Neutrophil Left Shift 1+ (A) Not Present    Absolute Neutrophils 10.1 (H) 2.0 - 7.5 10*9/L    Absolute Lymphocytes 0.8 (L) 1.5 - 5.0 10*9/L    Absolute Monocytes 0.6 0.2 - 0.8 10*9/L    Absolute Eosinophils 0.3 0.0 - 0.4 10*9/L    Absolute Basophils 0.0 0.0 - 0.1 10*9/L    Large Unstained Cells 1 0 - 4 %    Microcytosis Slight (A) Not  Present    Anisocytosis Moderate (A) Not Present    Hypochromasia Marked (A) Not Present   Morphology Review    Collection Time: 01/26/20  4:56 AM   Result Value Ref Range    Smear Review Comments See Comment (A) Undefined   POCT Glucose    Collection Time: 01/26/20  6:21 AM   Result Value Ref Range    Glucose, POC 103 70 - 179 mg/dL   Basic Metabolic Panel    Collection Time: 01/26/20  8:53 AM   Result Value Ref Range    Sodium 142 135 - 145 mmol/L    Potassium 3.7 3.4 - 4.5 mmol/L    Chloride 106 98 - 107 mmol/L    CO2 24.0 20.0 - 31.0 mmol/L    Anion Gap 12 5 - 14 mmol/L    BUN 12 9 - 23 mg/dL    Creatinine 1.61 (H) 0.60 - 1.10 mg/dL    BUN/Creatinine Ratio 10     EGFR CKD-EPI Non-African American, Male 75 >=60 mL/min/1.67m2    EGFR CKD-EPI African American, Male 49 >=60 mL/min/1.3m2    Glucose 113 70 - 179 mg/dL    Calcium 5.5 (LL) 8.7 - 10.4 mg/dL   Magnesium Level    Collection Time: 01/26/20  8:53 AM   Result Value Ref Range    Magnesium 1.9 1.6 - 2.6 mg/dL   Phosphorus Level    Collection Time: 01/26/20  8:53 AM   Result Value Ref Range    Phosphorus 5.6 (H) 2.4 - 5.1 mg/dL   CBC    Collection Time: 01/26/20  8:53 AM   Result Value Ref Range    WBC 10.6 4.5 - 11.0 10*9/L    RBC 4.39 (L) 4.50 - 5.90 10*12/L    HGB 11.1 (L) 13.5 - 17.5 g/dL    HCT 09.6 (L) 04.5 - 53.0 %    MCV 83.5 80.0 - 100.0 fL    MCH 25.3 (L) 26.0 - 34.0 pg    MCHC 30.3 (L) 31.0 - 37.0 g/dL    RDW 40.9 (H) 81.1 - 15.0 %    MPV 10.9 (H) 7.0 - 10.0 fL    Platelet 290 150 - 440 10*9/L   POCT Glucose    Collection Time: 01/26/20 11:13 AM   Result Value Ref Range    Glucose, POC 116 70 - 179 mg/dL         Imaging: none      ATTENDING ATTESTATION:  I was the supervising physician in the delivery of the service.  ______________________________

## 2020-01-27 LAB — CBC W/ AUTO DIFF
BASOPHILS ABSOLUTE COUNT: 0 10*9/L (ref 0.0–0.1)
BASOPHILS RELATIVE PERCENT: 0.5 %
EOSINOPHILS ABSOLUTE COUNT: 0.4 10*9/L (ref 0.0–0.4)
EOSINOPHILS RELATIVE PERCENT: 4.7 %
HEMATOCRIT: 31.3 % — ABNORMAL LOW (ref 41.0–53.0)
HEMOGLOBIN: 9.9 g/dL — ABNORMAL LOW (ref 13.5–17.5)
LARGE UNSTAINED CELLS: 1 % (ref 0–4)
LYMPHOCYTES ABSOLUTE COUNT: 0.6 10*9/L — ABNORMAL LOW (ref 1.5–5.0)
LYMPHOCYTES RELATIVE PERCENT: 6.7 %
MEAN CORPUSCULAR HEMOGLOBIN CONC: 31.4 g/dL (ref 31.0–37.0)
MEAN CORPUSCULAR HEMOGLOBIN: 25.5 pg — ABNORMAL LOW (ref 26.0–34.0)
MEAN CORPUSCULAR VOLUME: 81 fL (ref 80.0–100.0)
MEAN PLATELET VOLUME: 10 fL (ref 7.0–10.0)
MONOCYTES ABSOLUTE COUNT: 0.4 10*9/L (ref 0.2–0.8)
MONOCYTES RELATIVE PERCENT: 4.8 %
NEUTROPHILS ABSOLUTE COUNT: 7.1 10*9/L (ref 2.0–7.5)
NEUTROPHILS RELATIVE PERCENT: 82.8 %
PLATELET COUNT: 268 10*9/L (ref 150–440)
RED BLOOD CELL COUNT: 3.87 10*12/L — ABNORMAL LOW (ref 4.50–5.90)
RED CELL DISTRIBUTION WIDTH: 18.6 % — ABNORMAL HIGH (ref 12.0–15.0)
WBC ADJUSTED: 8.6 10*9/L (ref 4.5–11.0)

## 2020-01-27 LAB — BASIC METABOLIC PANEL
ANION GAP: 11 mmol/L (ref 5–14)
BLOOD UREA NITROGEN: 12 mg/dL (ref 9–23)
BUN / CREAT RATIO: 11
CALCIUM: 5.5 mg/dL — CL (ref 8.7–10.4)
CHLORIDE: 111 mmol/L — ABNORMAL HIGH (ref 98–107)
CO2: 25 mmol/L (ref 20.0–31.0)
CREATININE: 1.06 mg/dL
EGFR CKD-EPI AA MALE: >90 mL/min/{1.73_m2} (ref >=60)
EGFR CKD-EPI NON-AA MALE: 85 mL/min/{1.73_m2} (ref >=60)
GLUCOSE RANDOM: 132 mg/dL (ref 70–179)
POTASSIUM: 3.7 mmol/L (ref 3.4–4.5)
SODIUM: 147 mmol/L — ABNORMAL HIGH (ref 135–145)

## 2020-01-27 LAB — IONIZED CALCIUM VENOUS: CALCIUM IONIZED VENOUS (MG/DL): 2.91 mg/dL — CL (ref 4.40–5.40)

## 2020-01-27 LAB — PHOSPHORUS: PHOSPHORUS: 4.8 mg/dL (ref 2.4–5.1)

## 2020-01-27 LAB — HEPATIC FUNCTION PANEL
ALBUMIN: 2.7 g/dL — ABNORMAL LOW (ref 3.4–5.0)
ALKALINE PHOSPHATASE: 102 U/L (ref 46–116)
ALT (SGPT): 56 U/L — ABNORMAL HIGH (ref 10–49)
AST (SGOT): 58 U/L — ABNORMAL HIGH (ref <=34)
BILIRUBIN DIRECT: 0.2 mg/dL (ref 0.00–0.30)
BILIRUBIN TOTAL: 0.4 mg/dL (ref 0.3–1.2)
PROTEIN TOTAL: 5.8 g/dL (ref 5.7–8.2)

## 2020-01-27 LAB — TRIGLYCERIDES: TRIGLYCERIDES: 200 mg/dL — ABNORMAL HIGH (ref 0–150)

## 2020-01-27 LAB — MAGNESIUM: MAGNESIUM: 2 mg/dL (ref 1.6–2.6)

## 2020-01-27 MED ADMIN — DULoxetine (CYMBALTA) DR capsule 30 mg: 30 mg | ORAL | @ 01:00:00 | Stop: 2020-02-02

## 2020-01-27 MED ADMIN — pantoprazole (PROTONIX) injection 40 mg: 40 mg | INTRAVENOUS | @ 19:00:00 | Stop: 2020-02-02

## 2020-01-27 MED ADMIN — dextrose 5 % and sodium chloride 0.45 % with KCl 20 mEq/L infusion: 125 mL/h | INTRAVENOUS | @ 15:00:00 | Stop: 2020-01-27

## 2020-01-27 MED ADMIN — sodium chloride (NS) 0.9 % flush 10 mL: 10 mL | INTRAVENOUS | @ 08:00:00 | Stop: 2020-02-02

## 2020-01-27 MED ADMIN — sodium chloride (NS) 0.9 % flush 10 mL: 10 mL | INTRAVENOUS | @ 23:00:00 | Stop: 2020-02-02

## 2020-01-27 MED ADMIN — pantoprazole (PROTONIX) injection 40 mg: 40 mg | INTRAVENOUS | @ 11:00:00 | Stop: 2020-02-02

## 2020-01-27 MED ADMIN — sodium chloride (NS) 0.9 % flush 10 mL: 10 mL | INTRAVENOUS | @ 17:00:00 | Stop: 2020-02-02

## 2020-01-27 MED ADMIN — enoxaparin (LOVENOX) syringe 40 mg: 40 mg | SUBCUTANEOUS | @ 15:00:00 | Stop: 2020-02-02

## 2020-01-27 MED ADMIN — acetaminophen (OFIRMEV) 10 mg/mL injection 1,000 mg: 1000 mg | INTRAVENOUS | @ 11:00:00 | Stop: 2020-01-28

## 2020-01-27 MED ADMIN — acetaminophen (OFIRMEV) 10 mg/mL injection 1,000 mg: 1000 mg | INTRAVENOUS | @ 05:00:00 | Stop: 2020-01-28

## 2020-01-27 MED ADMIN — enoxaparin (LOVENOX) syringe 40 mg: 40 mg | SUBCUTANEOUS | @ 01:00:00 | Stop: 2020-02-02

## 2020-01-27 MED ADMIN — acetaminophen (OFIRMEV) 10 mg/mL injection 1,000 mg: 1000 mg | INTRAVENOUS | @ 19:00:00 | Stop: 2020-01-28

## 2020-01-27 MED ADMIN — calcium gluconate 1 g in sodium chloride (NS) 0.9 % 100 mL IVPB: 1 g | INTRAVENOUS | @ 15:00:00 | Stop: 2020-01-27

## 2020-01-27 MED ADMIN — DULoxetine (CYMBALTA) DR capsule 30 mg: 30 mg | ORAL | @ 15:00:00 | Stop: 2020-02-02

## 2020-01-27 MED ADMIN — calcium gluconate 1 g in sodium chloride (NS) 0.9 % 100 mL IVPB: 1 g | INTRAVENOUS | @ 12:00:00 | Stop: 2020-01-27

## 2020-01-27 MED ADMIN — levothyroxine (SYNTHROID) injection: 188 ug | INTRAVENOUS | @ 23:00:00 | Stop: 2020-01-31

## 2020-01-27 MED FILL — MEKTOVI 15 MG TABLET: ORAL | 30 days supply | Qty: 180 | Fill #3

## 2020-01-27 MED FILL — BRAFTOVI 75 MG CAPSULE: ORAL | 30 days supply | Qty: 180 | Fill #3

## 2020-01-27 MED FILL — SULFASALAZINE 500 MG TABLET: 30 days supply | Qty: 240 | Fill #3

## 2020-01-27 NOTE — Unmapped (Signed)
PARENTERAL NUTRITION CONSULTATION NOTE       NUTRITION ASSESSMENT       Nutrition Progress:  Patient now NPO/Clears day 8. Had one day trial of regular diet, however experience multiple bouts of emesis. Now with NG to suction, ~1100 mL output overnight. Asked RN for updated weight, noted significant weight loss (7%) over course of admission.  Completed remotely.    Anthropometric Data:  Height: 188 cm (6' 2)   Admission weight: (!) 149.7 kg (330 lb)  Last recorded weight: (!) 140.1 kg (308 lb 12.8 oz)  IBW: 86.23 kg  Percent IBW: 173.59 %  BMI: Body mass index is 39.65 kg/m??.   Usual Body Weight: Unable to obtain at this time     Weight history prior to admission:   Wt Readings from Last 10 Encounters:   01/26/20 (!) 140.1 kg (308 lb 12.8 oz)   01/13/20 (!) 149.3 kg (329 lb 1.6 oz)   01/03/20 (!) 150.4 kg (331 lb 9.2 oz)   01/03/20 (!) 150.4 kg (331 lb 9.6 oz)   12/23/19 (!) 147.1 kg (324 lb 4.8 oz)   11/25/19 (!) 144.9 kg (319 lb 8 oz)   11/11/19 (!) 144.4 kg (318 lb 4.8 oz)   09/30/19 (!) 141.4 kg (311 lb 12.8 oz)   08/30/19 (!) 141 kg (310 lb 14.4 oz)   08/24/19 (!) 142 kg (313 lb)        Weight changes this admission:   Last 5 Recorded Weights    01/20/20 1748 01/26/20 1030   Weight: (!) 149.7 kg (330 lb) (!) 140.1 kg (308 lb 12.8 oz)        Nutrition Focused Physical Exam:    Nutrition Evaluation  Overall Impressions: Nutrition-Focused Physical Exam not indicated due to lack of malnutrition risk factors. (01/22/20 1543)  Nutrition Designation: Obese class III  (BMI > 39.99 kg/m2) (01/22/20 1543)      NUTRITIONALLY RELEVANT DATA     Medications:   Nutritionally pertinent medications reviewed and evaluated for potential food and/or medication interactions.   TPN Order:   Recent TPN Orders (Show up to 1 orders ; newest on the right.)     Start date and time 01/28/2020 0000       Adult 3-in-1 PN [[id number]]     Order Status Active        Macro Ingredients    amino acid (CLINISOL SF) 15% 100 g     dextrose 150 g Electrolytes    potassium chloride 40 mEq     potassium phosphate 15 mmol     magnesium sulfate dilution 8 mEq        Additives    multivitamin, adult injection 10 mL     trace elements (TRALEMENT) Zn-Cu-Mn-Se 1 mL        Medications    thiamine 200 mg        QS Base    sterile water 7.1 mL        Lipids    fat emulsion 20 % 30 g        Energy Contribution    Proteins 400 kcal     Dextrose 510 kcal     Lipids 300 kcal     Total 1,210 kcal        Electrolyte Ion Calculated Amount    Sodium --     Potassium 62 mEq     Calcium --     Magnesium 8 mEq     Aluminum --  Phosphate 15 mmol     Chloride 40 mEq     Acetate --        Other    Total Amino Acid 100 g     Total Amino Acid/kg 0.71 g/kg     Glucose Infusion Rate 0.74 mg/kg/min     Osmolarity (Estimated) 1,768.61     Volume 1,080 mL     Rate 45 mL/hr     Dosing Weight 140.1 kg     Infusion Site [facility]        Admin Instructions      Please infuse using a parenteral nutrition tubing set with a 1.2-micron filter           Labs:     Results in Past 7 Days  Result Component Current Result   Sodium 147 (H) (01/27/2020)   Potassium 3.7 (01/27/2020)   Chloride 111 (H) (01/27/2020)   CO2 25.0 (01/27/2020)   BUN 12 (01/27/2020)   Creatinine 1.06 (01/27/2020)   Glucose 132 (01/27/2020)   Calcium 5.5 (LL) (01/27/2020)   Ionized Calcium Not in Time Range   Magnesium 2.0 (01/27/2020)   Phosphorus 4.8 (01/27/2020)   Albumin 2.7 (L) (01/27/2020)   Total Bilirubin 0.4 (01/27/2020)   Bilirubin, Direct 0.20 (01/27/2020)   AST 58 (H) (01/27/2020)   ALT 56 (H) (01/27/2020)   Alkaline Phosphatase 102 (01/27/2020)   Triglycerides 200 (H) (01/27/2020)   Glucose, POC 104 (01/27/2020)        Nutrition History:   01/27/20: PICC placed 01/12 evening. Plan to start TPN tonight.   01/26/20: NPO/Clears day 8. Plan to start TPN tonight. Informed by VAT that PICC would likely not be placed on 01/12.   January 22, 2020: Prior to admission: Patient unable to provide at this time. Patient started to discuss his nutrition hx but was unable to proceed due to starting to vomit in bag. Patient confirmed recent weight gain due to steroids. Per RN, patient started the day on Regular diet, began to vomit; was transitioned to clear liquid, again vomiting. Now NPO with sips. Patient was anticipated discharge pending tolerance to meal yesterday. Patient given Zofran and may start on Phenergan; possible NGT placement pending. Loose BMs and passing flatus per notes.    Allergies, Intolerances, Sensitivities, and/or Cultural/Religious Dietary Restrictions: include coconut    Current Nutrition:  NPO       Nutrition Orders   (From admission, onward)             Start     Ordered    01/22/20 1411  NPO Sips with meds; Medically necessary  Effective now        Question Answer Comment   NPO Except: Sips with meds    Reason: Medically necessary        01/22/20 1410                   Nutritional Needs:   Daily Estimated Nutrient Needs:  Energy: [phone] kcal/day (BMR x 1.1-1.2) kcals Per Miffl[employer] St-Jeor Equation using last recorded weight, 150 kg (01/22/20 1552)]  Protein: 135-148 gm/day gm [other (comment) (20% of kcal) using last recorded weight, 150 kg (01/22/20 1552)]  Carbohydrate:   [no restriction]  Fluid:   mL [1 mL/kcal (maintenance)]        Malnutrition Assessment using AND/ASPEN Clinical Characteristics:    Severe Protein-Calorie Malnutrition in the context of acute illness or injury (01/26/20 1033)  Energy Intake: < or equal to 50% of estimated energy requirement of >  or equal to 5 days  Interpretation of Wt. Loss: > 2% x 1 week  Malnutrition Score: 2      GOALS and EVALUATION     ??? Patient to meet 100% or greater of nutritional needs via parenteral nutrition within 3-5 days.  - New    Motivation, Barriers, and Compliance:  Evaluation of motivation, barriers, and compliance completed and include clinical condition     NUTRITION ASSESSMENT     ??? Patient appropriate for parenteral nutrition based on the following ASPEN criteria of bowel obstruction.  ??? Patient would benefit from addition of 200 mg of thiamine in parenteral nutrition (PN) in order to help minimize effects of refeeding syndrome.   ??? Patient appropriate for use of IV multivitamins due to NPO status and inability to take po/enteral meds at this time.   ??? Patient will no longer need dextrose and K+ in IV fluids starting at midnight when parenteral nutrition (PN) starts.       Discharge Planning:   Monitor for potential discharge needs with multi-disciplinary team.       NUTRITION INTERVENTIONS and RECOMMENDATION     Daily BMP, magnesium, and phosphorus while on TPN until stable  Check triglycerides  Check hepatic panel    If home TPN services are anticipated, please contact the TPN team ([facility] On[facility]Call in [place]) at least 72 hours prior to anticipated discharge in order to coordinate cycling for home discharge.      Follow-Up Parameters:   Monitor daily while on TPN    Lavella Lemons, MS, RD, LDN, CNSC  Pager: [phone]

## 2020-01-27 NOTE — Unmapped (Signed)
[place] TRAUMA, ACUTE CARE, and GENERAL SURGERY   - Surgery Daily Progress Note -  01/27/2020       Admit Date: 01/18/2020, Hospital Day: 10  Hospital Service: [facility])      Assessment     Kenneth Fitzgerald [facility] 45 y.o. 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 He underwent primary open ventral hernia repair with Dr. 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 Carlynn Purl on 01/24/20  - Diet: NPO with NGT, PICC paced 1/12 and TPN to start tonight  - Fluids: mIVF at 125 - TFO 125 in place for when TPN starts  - Zofran as needed for nausea   - Replete lytes prn: 2g calcium gluconate for hypocalcemia  - Bowel regimen: on hold, NPO  - continue provena wound vac until POD5.     *Chron's disease  -  Hold home sulfasalazine to await return of bowel function    GU: AKI on admission Cr peaked at 1.34 likely due to nausea, vomiting and poor PO intake in the setting of SBO (resolved)  - Adequate UOP.   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   - Dexamethasone discontinued 1/10 following taper    ID:  - Afebrile.  - WBC- 8.6 from 10.6    PPx:   - Heparin     Dispo: Floor status  PT/OT: not indicated  Barriers to discharge: AROBF, NGT removal, advancing diet  CM/SW assisting in discharge planning.     Contact: [facility]  Tamsen Snider  [phone]      Subjective:   NAEO. Passing flatus but still feels distended.    Objective:     Vital signs in last 24 hours:  Temp:  [36 ??C (96.8 ??F)-36.8 ??C (98.2 ??F)] 36.3 ??C (97.3 ??F)  Heart Rate:  [90-103] 90  Resp:  [17-19] 19  BP: (126-147)/(79-89) 147/87  MAP (mmHg):  [98-111] 111  SpO2:  [97 %-99 %] 99 %    Intake/Output last 24 hours:  I/O last 3 completed shifts:  In: 1400 [I.V.:1300; IV Piggyback:100]  Out: 5625 [Urine:3485; Emesis/NG output:1700; Drains:440]    Physical Exam:  -General:  Appropriate, comfortable and in no  apparent distress.   -Neurological: Alert and oriented x3. Moves all 4 extremities spontaneously.   -Pulmonary: Normal work of breathing. No accessory muscle use.  -Abdomen: Soft, mildly distended, tender to palpation midline at incision with provena vac in place and SS output.  Drain to LUQ with SS output. NGT with light brown output.    -Extremities: Warm, well perfused, normal skin turgor.    Data Review:    All lab results last 24 hours:    Recent Results (from the past 24 hour(s))   POCT Glucose    Collection Time: 01/26/20  5:46 PM   Result Value Ref Range    Glucose, POC 125 70 - 179 mg/dL   POCT Glucose    Collection Time: 01/26/20 10:20 PM   Result Value Ref Range    Glucose, POC 102 70 - 179 mg/dL   CBC w/ Differential    Collection Time: 01/27/20  3:59 AM   Result Value Ref Range    WBC 8.6 4.5 - 11.0 10*9/L    RBC 3.87 (L) 4.50 - 5.90 10*12/L    HGB 9.9 (L) 13.5 - 17.5 g/dL    HCT 16.1 (L) 09.6 - 53.0 %    MCV 81.0 80.0 - 100.0 fL    MCH 25.5 (L) 26.0 - 34.0 pg    MCHC 31.4 31.0 - 37.0 g/dL    RDW 04.5 (H) 40.9 - 15.0 %    MPV 10.0 7.0 - 10.0 fL    Platelet 268 150 - 440 10*9/L    Neutrophils % 82.8 %    Lymphocytes % 6.7 %    Monocytes % 4.8 %    Eosinophils % 4.7 %    Basophils % 0.5 %    Neutrophil Left Shift 1+ (A) Not Present    Absolute Neutrophils 7.1 2.0 - 7.5 10*9/L    Absolute Lymphocytes 0.6 (L) 1.5 - 5.0 10*9/L    Absolute Monocytes 0.4 0.2 - 0.8 10*9/L    Absolute Eosinophils 0.4 0.0 - 0.4 10*9/L    Absolute Basophils 0.0 0.0 - 0.1 10*9/L    Large Unstained Cells 1 0 - 4 %    Microcytosis Moderate (A) Not Present    Anisocytosis Moderate (A) Not Present    Hypochromasia Moderate (A) Not Present   Basic Metabolic Panel    Collection Time: 01/27/20  4:00 AM   Result Value Ref Range    Sodium 147 (H) 135 - 145 mmol/L    Potassium 3.7 3.4 - 4.5 mmol/L    Chloride 111 (H) 98 - 107 mmol/L    CO2 25.0 20.0 - 31.0 mmol/L    Anion Gap 11 5 - 14 mmol/L    BUN 12 9 - 23 mg/dL    Creatinine 8.11 9.14 - 1.10 mg/dL    BUN/Creatinine Ratio 11     EGFR CKD-EPI Non-African American, Male 85 >=60 mL/min/1.35m2    EGFR CKD-EPI African American, Male >90 >=60 mL/min/1.38m2    Glucose 132 70 - 179 mg/dL    Calcium 5.5 (LL) 8.7 - 10.4 mg/dL   Magnesium Level    Collection Time: 01/27/20  4:00 AM   Result Value Ref Range    Magnesium 2.0 1.6 - 2.6 mg/dL   Triglycerides    Collection Time: 01/27/20  4:00 AM   Result Value Ref Range    Triglycerides 200 (H) 0 - 150 mg/dL   Hepatic Function Panel    Collection Time: 01/27/20  4:00 AM   Result  Value Ref Range    Albumin 2.7 (L) 3.4 - 5.0 g/dL    Total Protein 5.8 5.7 - 8.2 g/dL    Total Bilirubin 0.4 0.3 - 1.2 mg/dL    Bilirubin, Direct 1.61 0.00 - 0.30 mg/dL    AST 58 (H) <=09 U/L    ALT 56 (H) 10 - 49 U/L    Alkaline Phosphatase 102 46 - 116 U/L   Phosphorus Level    Collection Time: 01/27/20  4:00 AM   Result Value Ref Range Phosphorus 4.8 2.4 - 5.1 mg/dL   POCT Glucose    Collection Time: 01/27/20  5:49 AM   Result Value Ref Range    Glucose, POC 104 70 - 179 mg/dL   Ionized Calcium, Venous    Collection Time: 01/27/20  8:34 AM   Result Value Ref Range    Calcium, Ionized Venous 2.91 (LL) 4.40 - 5.40 mg/dL   POCT Glucose    Collection Time: 01/27/20 11:45 AM   Result Value Ref Range    Glucose, POC 102 70 - 179 mg/dL       Imaging: none    ATTENDING ATTESTATION:  I was the supervising physician in the delivery of the service.  ______________________________

## 2020-01-27 NOTE — Unmapped (Signed)
Patient is alert and oriented x 4, neurovascularly intact. Remains NPO at this time per MD orders. Provena wound vac to abdomen remains patient with no output. NG remains at continuous suction with brown drainage. Patient ambulates to bathroom. Requires assistance just to help with lines and drains. He continues to use dilaudid pca for pain control and denied need for further pain interventions. Patient has an order for a PICC to be placed by the VAT. Once that is done, he will start TPN.     Problem: Pain Acute  Goal: Acceptable Pain Control and Functional Ability  Outcome: Progressing     Problem: VTE (Venous Thromboembolism)  Goal: VTE (Venous Thromboembolism) Symptom Resolution  Outcome: Progressing     Problem: Self-Care Deficit  Goal: Improved Ability to Complete Activities of Daily Living  Outcome: Progressing

## 2020-01-27 NOTE — Unmapped (Signed)
Hancel Ion [facility] 's Entire shipment will be delayed as a result of a high copay.     I have reached out to the patient and was unable to leave a message. We will call the patient back to reschedule the delivery upon resolution. We have not confirmed the new delivery date.

## 2020-01-27 NOTE — Unmapped (Signed)
Kenneth Fitzgerald [facility] 's ENTIRE shipment will be sent out  as a result of copay is now approved by patient/caregiver.      I have reached out to the patient and communicated the delivery change. We will reschedule the medication for the delivery date that the patient agreed upon.  We have confirmed the delivery date as 01/28/20, via ups.

## 2020-01-27 NOTE — Unmapped (Shared)
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 He is POD3 following a open primary ventral hernia repair with Prevena wound vac placement.     Plan:  Neuro:  - continue pain management; well controlled with IV duladid, acetominophen and oxycodone prn  HEENT:  - continue to monitor for improving pain on the back of the scalp  CV:  -continue to monitor vitals  Respiratory:  - continue to monitor for maintained improved SpO2  GI:  - AKI improving; Scr down from 1.20 to 1.06  - Nutrition: initiate TPN via PICC line, monitor for continued bowel movements and transition to clear NG tube output. Consider NG tube removal when output <1000 and is clear. Consider wound vac removal on POD4.   GU:  - continue to monitor urinary habits  Nephrology:  - AKI resolving: continue to monitor Scr levels  - continue to monitor K levels as still slightly low  Endocrine:  - DM: continue to hold metformin  Heme:  - metastatic melanoma: stable; continue dabrafenib/trametinib  ID:  - continue to trend WBC count decreased to 8.6  Dispo:   Maintain floor status and continue to monitor    Subjective:  No acute events overnight. PICC line placed 01/26/2020. He reports area of tenderness on back of scalp continues to improve.    Objective:  Vitals:  Temp: 36.1 C  HR: 93  RR: 18  BP: 127/92  SpO2: 97%    Ins/Outs:  Ins: 1400 [IV: 1300; IV Piggyback: 100]  Outs: 3570 [Urine: 2250; Emesis/NG output: 1100; Drains: 220]    Diagnostic data reviewed:  Labs over the previous 24 hour period    Physical Exam:  General: laying comfortably on the exam table in no apparent distress   Neuro: alert and oriented  CV: Regular rate and rhythm   Pulmonary: Normal work of breathing on RA. No adventitious sound noted  Abdominal: Tenderness to palpation throughout abdomen. Improving serous fluid within JP drain. Wound vac in place without complications and covered by binder. NGT producing improving brown output

## 2020-01-27 NOTE — Unmapped (Signed)
PICC LINE INSERTION PROCEDURE NOTE    Indications:  TPN    Consent/Time Out:    Risks, benefits and alternatives discussed with patient.  Written consent was obtained prior to the procedure and is detailed in the medical record.  Prior to the start of the procedure, a time out was taken and the identity of the patient was confirmed via name, medical record number and  date of birth.  The availability of the correct equipment was verified.    Procedure Details:  The vein was identified and measured for appropriate catheter length. Maximum sterile techniques including masks and protective eyewear were utilized.  Sterile field prepared with necessary supplies and equipment. Insertion site was prepped with chlorhexidine solution and allowed to dry. Lidocaine 2 mL subcutaneously and intradermally administered to insertion site.  The catheter was primed with normal saline.  A 4 FR Double lumen was inserted to the R Basilic vein with 1 insertion attempt(s).  Catheter aspirated, 10 mL blood return present.  The catheter was then flushed with 20 mL of normal saline.   Insertion site cleansed, and sterile dressing applied per manufacturer guidelines. The Central Line Checklist was referenced.  The catheter was inserted without difficulty by Alfredo Bach RN and assisted by Marylee Floras RN.    Findings:  Manufacturer:  [employer]  Lot #:  [id number]  CT Injectable (power):  Yes  Arm Circumference:  39  cm  Total catheter length:  48 cm.    External length:  1 cm.  Catheter trimmed:  yes  Port reserved:  red color port reserved for TPN  Patient Position:  supine    Vein Size:   Right arm basilic vein compressible:  Yes, measurement:   (see image)    Tip Placement Verification:   [employer] and [employer]lock    Workup Time:    90 minutes    Recommendations:  Refer to head of bed sign      See vein image below:

## 2020-01-27 NOTE — Unmapped (Signed)
NGT to LCWS. Voiding. Ambulated in hallway x1 during the shift. Abdominal dressing with provena wound vac intact. Abdominal binder in place. NPO maintained. No acute events overnight. All safety measures in place. Will continue to monitor  Problem: Pain Acute  Goal: Acceptable Pain Control and Functional Ability  Outcome: Progressing     Problem: VTE (Venous Thromboembolism)  Goal: VTE (Venous Thromboembolism) Symptom Resolution  Outcome: Progressing  Intervention: Prevent or Manage VTE (Venous Thromboembolism)  Recent Flowsheet Documentation  Taken 01/26/2020 0200 by Tresa Res, RN  Anti-Embolism Device Type: SCD, Knee  Anti-Embolism Intervention: On  Anti-Embolism Device Location: BLE  Taken 01/26/2020 0000 by Tresa Res, RN  Anti-Embolism Device Type: SCD, Knee  Anti-Embolism Intervention: On  Anti-Embolism Device Location: BLE  Taken 01/25/2020 2200 by Tresa Res, RN  Anti-Embolism Device Type: SCD, Knee  Anti-Embolism Intervention: On  Anti-Embolism Device Location: BLE  Taken 01/25/2020 2100 by Tresa Res, RN  Anti-Embolism Device Type: SCD, Knee  Anti-Embolism Intervention: On  Anti-Embolism Device Location: BLE  Taken 01/25/2020 2000 by Tresa Res, RN  Anti-Embolism Device Type: SCD, Knee  Anti-Embolism Intervention: On  Anti-Embolism Device Location: BLE

## 2020-01-28 LAB — CBC W/ AUTO DIFF
BASOPHILS ABSOLUTE COUNT: 0 10*9/L (ref 0.0–0.1)
BASOPHILS RELATIVE PERCENT: 0.4 %
EOSINOPHILS ABSOLUTE COUNT: 0.3 10*9/L (ref 0.0–0.4)
EOSINOPHILS RELATIVE PERCENT: 4.9 %
HEMATOCRIT: 31 % — ABNORMAL LOW (ref 41.0–53.0)
HEMOGLOBIN: 9.3 g/dL — ABNORMAL LOW (ref 13.5–17.5)
LARGE UNSTAINED CELLS: 1 % (ref 0–4)
LYMPHOCYTES ABSOLUTE COUNT: 0.6 10*9/L — ABNORMAL LOW (ref 1.5–5.0)
LYMPHOCYTES RELATIVE PERCENT: 9 %
MEAN CORPUSCULAR HEMOGLOBIN CONC: 29.9 g/dL — ABNORMAL LOW (ref 31.0–37.0)
MEAN CORPUSCULAR HEMOGLOBIN: 25.2 pg — ABNORMAL LOW (ref 26.0–34.0)
MEAN CORPUSCULAR VOLUME: 84.4 fL (ref 80.0–100.0)
MEAN PLATELET VOLUME: 10.1 fL — ABNORMAL HIGH (ref 7.0–10.0)
MONOCYTES ABSOLUTE COUNT: 0.3 10*9/L (ref 0.2–0.8)
MONOCYTES RELATIVE PERCENT: 4.1 %
NEUTROPHILS ABSOLUTE COUNT: 5.6 10*9/L (ref 2.0–7.5)
NEUTROPHILS RELATIVE PERCENT: 80.9 %
PLATELET COUNT: 236 10*9/L (ref 150–440)
RED BLOOD CELL COUNT: 3.68 10*12/L — ABNORMAL LOW (ref 4.50–5.90)
RED CELL DISTRIBUTION WIDTH: 19.1 % — ABNORMAL HIGH (ref 12.0–15.0)
WBC ADJUSTED: 6.9 10*9/L (ref 4.5–11.0)

## 2020-01-28 LAB — BASIC METABOLIC PANEL
ANION GAP: 9 mmol/L (ref 5–14)
BLOOD UREA NITROGEN: 10 mg/dL (ref 9–23)
BUN / CREAT RATIO: 10
CALCIUM: 5.5 mg/dL — CL (ref 8.7–10.4)
CHLORIDE: 112 mmol/L — ABNORMAL HIGH (ref 98–107)
CO2: 26 mmol/L (ref 20.0–31.0)
CREATININE: 1.02 mg/dL
EGFR CKD-EPI AA MALE: >90 mL/min/{1.73_m2} (ref >=60)
EGFR CKD-EPI NON-AA MALE: 89 mL/min/{1.73_m2} (ref >=60)
GLUCOSE RANDOM: 115 mg/dL (ref 70–179)
POTASSIUM: 3.3 mmol/L — ABNORMAL LOW (ref 3.4–4.5)
SODIUM: 147 mmol/L — ABNORMAL HIGH (ref 135–145)

## 2020-01-28 LAB — PHOSPHORUS: PHOSPHORUS: 5.3 mg/dL — ABNORMAL HIGH (ref 2.4–5.1)

## 2020-01-28 LAB — MAGNESIUM: MAGNESIUM: 1.9 mg/dL (ref 1.6–2.6)

## 2020-01-28 MED ADMIN — sodium chloride (NS) 0.9 % flush 10 mL: 10 mL | INTRAVENOUS | @ 01:00:00 | Stop: 2020-02-02

## 2020-01-28 MED ADMIN — lactated Ringers infusion: 100 mL/h | INTRAVENOUS | @ 06:00:00 | Stop: 2020-01-28

## 2020-01-28 MED ADMIN — acetaminophen (OFIRMEV) 10 mg/mL injection 1,000 mg: 1000 mg | INTRAVENOUS | @ 01:00:00 | Stop: 2020-01-28

## 2020-01-28 MED ADMIN — enoxaparin (LOVENOX) syringe 40 mg: 40 mg | SUBCUTANEOUS | @ 13:00:00 | Stop: 2020-02-02

## 2020-01-28 MED ADMIN — sodium chloride (NS) 0.9 % flush 10 mL: 10 mL | INTRAVENOUS | @ 08:00:00 | Stop: 2020-02-02

## 2020-01-28 MED ADMIN — acetaminophen (OFIRMEV) 10 mg/mL injection 1,000 mg: 1000 mg | INTRAVENOUS | @ 12:00:00 | Stop: 2020-01-29

## 2020-01-28 MED ADMIN — DULoxetine (CYMBALTA) DR capsule 30 mg: 30 mg | ORAL | @ 13:00:00 | Stop: 2020-02-02

## 2020-01-28 MED ADMIN — enoxaparin (LOVENOX) syringe 40 mg: 40 mg | SUBCUTANEOUS | @ 01:00:00 | Stop: 2020-02-02

## 2020-01-28 MED ADMIN — DULoxetine (CYMBALTA) DR capsule 30 mg: 30 mg | ORAL | @ 01:00:00 | Stop: 2020-02-02

## 2020-01-28 MED ADMIN — levothyroxine (SYNTHROID) injection: 188 ug | INTRAVENOUS | Stop: 2020-01-31

## 2020-01-28 MED ADMIN — acetaminophen (OFIRMEV) 10 mg/mL injection 1,000 mg: 1000 mg | INTRAVENOUS | @ 06:00:00 | Stop: 2020-01-28

## 2020-01-28 MED ADMIN — sodium chloride (NS) 0.9 % flush 10 mL: 10 mL | INTRAVENOUS | Stop: 2020-02-02

## 2020-01-28 MED ADMIN — lactated Ringers infusion: 80 mL/h | INTRAVENOUS | @ 16:00:00 | Stop: 2020-01-29

## 2020-01-28 MED ADMIN — pantoprazole (PROTONIX) injection 40 mg: 40 mg | INTRAVENOUS | @ 10:00:00 | Stop: 2020-02-02

## 2020-01-28 MED ADMIN — Adult 3-in-1 PN: INTRAVENOUS | @ 06:00:00 | Stop: 2020-01-29

## 2020-01-28 MED ADMIN — pantoprazole (PROTONIX) injection 40 mg: 40 mg | INTRAVENOUS | @ 20:00:00 | Stop: 2020-02-02

## 2020-01-28 MED ADMIN — sodium chloride (NS) 0.9 % flush 10 mL: 10 mL | INTRAVENOUS | @ 16:00:00 | Stop: 2020-02-02

## 2020-01-28 MED ADMIN — acetaminophen (OFIRMEV) 10 mg/mL injection 1,000 mg: 1000 mg | INTRAVENOUS | @ 18:00:00 | Stop: 2020-01-29

## 2020-01-28 NOTE — Unmapped (Signed)
Oncology Treatment Plan       Service Requesting Consult : [facility])  Reason for Consult: metastatic melanoma  Primary Oncologist: Dr. Lendell Caprice  ??  Assessment: Kenneth Fitzgerald is a 45 y.o.  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 CT demonstrated closed loop bowel obstruction within mid abdominal ventral hernia. While peritoneal implants and abdominal/pelvic adenopathy visualized, lymph nodes less priominent than on prior imaging. After failed medical management, he was brought to OR for open repair (1/10). He is currently on TPN and awaiting clamp trial and possible NGT removal this weekend. Presence of peritoneal disease may delay recovery.    He has completed WBRT and dex taper for CNS disease this admission.     Dabrafenib/trametinib have been held since admission on 01/18/2020    We will follow peripherally but please reach out if major concerns arise, and touch base prior to discharge to ensure appropriate onc follow-up is in place.    Wendie Simmer, MD  Heme/Onc Fellow

## 2020-01-28 NOTE — Unmapped (Signed)
Kenneth Fitzgerald TRAUMA, ACUTE CARE, and GENERAL SURGERY   - Surgery Daily Progress Note -  01/28/2020       Admit Date: 01/18/2020, Hospital Day: 11  Hospital Service: [facility])      Assessment     Kenneth Fitzgerald 45 y.o. 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 He underwent primary open ventral hernia repair with Dr. 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 Carlynn Purl on 01/24/20  - Diet: NPO with NGT (clamp trial today)  - PICC paced 1/12 and TPN started 1/13.   - Fluids: mIVF and TPN - TFO 125 in place   - Zofran as needed for nausea   - Replete lytes prn: K given today  - Bowel regimen: on hold, NPO  - continue provena wound vac until POD5 (1/15).     *Chron's disease  -  Hold home sulfasalazine to await return of bowel function    GU: AKI on admission Cr peaked at 1.34 likely due to nausea, vomiting and poor PO intake in the setting of SBO (resolved)  - Adequate UOP.   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   - Dexamethasone discontinued 1/10 following taper  - [facility] oncology at discharge for final recs and follow up needs    ID:  - Afebrile.  - WBC- WNL    PPx:   - Heparin     Dispo: Floor status  PT/OT: not indicated  Barriers to discharge: AROBF, NGT removal, advancing diet  CM/SW assisting in discharge planning.     Contact: [facility]  [name]  [phone]      Subjective:   NAEO. Passing flatus and had BM yesterday.  Drinking lots of water and ice chips due to mouth being dry.    Objective:     Vital signs in last 24 hours:  Temp:  [36.1 ??C (97 ??F)-36.6 ??C (97.9 ??F)] 36.4 ??C (97.5 ??F)  Heart Rate:  [82-90] 82  Resp:  [18-19] 18  BP: (110-148)/(59-87) 110/59  MAP (mmHg):  [82-111] 82  SpO2:  [97 %-100 %] 97 %    Intake/Output last 24 hours:  I/O last 3 completed shifts:  In: 1903.8 [P.O.:100; I.V.:1010; IV Piggyback:520]  Out: 2841 [Urine:4025; Emesis/NG output:2450; Drains:150]    Physical Exam:  -General:  Appropriate, comfortable and in no apparent distress.   -Neurological: Alert and oriented x3. Moves all 4 extremities spontaneously.   -Pulmonary: Normal work of breathing. No accessory muscle use.  -Abdomen: Soft, slight distended, tender to palpation midline at incision with provena vac in place and SS output.  Drain to LUQ with SS output. NGT with light brown output.    -Extremities: Warm, well perfused, normal skin turgor.    Data Review:    All lab results last 24 hours:    Recent Results (from the past 24 hour(s))   POCT Glucose    Collection Time: 01/27/20 11:45 AM   Result Value Ref Range    Glucose, POC 102 70 - 179 mg/dL   POCT Glucose    Collection Time: 01/27/20  6:12 PM   Result Value Ref Range    Glucose, POC 101 70 - 179 mg/dL   POCT Glucose    Collection Time: 01/27/20  9:25 PM   Result Value Ref Range    Glucose, POC 97 70 - 179 mg/dL   Magnesium Level    Collection Time: 01/28/20  3:17 AM   Result Value Ref Range    Magnesium 1.9 1.6 - 2.6 mg/dL   Phosphorus Level    Collection Time: 01/28/20  3:17 AM   Result Value Ref Range    Phosphorus 5.3 (H) 2.4 - 5.1 mg/dL   Basic Metabolic Panel    Collection Time: 01/28/20  3:17 AM   Result Value Ref Range    Sodium 147 (H) 135 - 145 mmol/L    Potassium 3.3 (L) 3.4 - 4.5 mmol/L    Chloride 112 (H) 98 - 107 mmol/L    CO2 26.0 20.0 - 31.0 mmol/L    Anion Gap 9 5 - 14 mmol/L    BUN 10 9 - 23 mg/dL    Creatinine 3.24 4.01 - 1.10 mg/dL    BUN/Creatinine Ratio 10     EGFR CKD-EPI Non-African American, Male 48 >=60 mL/min/1.62m2    EGFR CKD-EPI African American, Male >90 >=60 mL/min/1.77m2    Glucose 115 70 - 179 mg/dL    Calcium 5.5 (LL) 8.7 - 10.4 mg/dL   CBC w/ Differential    Collection Time: 01/28/20  3:17 AM   Result Value Ref Range    WBC 6.9 4.5 - 11.0 10*9/L    RBC 3.68 (L) 4.50 - 5.90 10*12/L    HGB 9.3 (L) 13.5 - 17.5 g/dL    HCT 02.7 (L) 25.3 - 53.0 %    MCV 84.4 80.0 - 100.0 fL    MCH 25.2 (L) 26.0 - 34.0 pg    MCHC 29.9 (L) 31.0 - 37.0 g/dL    RDW 66.4 (H) 40.3 - 15.0 %    MPV 10.1 (H) 7.0 - 10.0 fL    Platelet 236 150 - 440 10*9/L    Neutrophils % 80.9 %    Lymphocytes % 9.0 %    Monocytes % 4.1 %    Eosinophils % 4.9 %    Basophils % 0.4 %    Neutrophil Left Shift 1+ (A) Not Present    Absolute Neutrophils 5.6 2.0 - 7.5 10*9/L    Absolute Lymphocytes 0.6 (L) 1.5 - 5.0 10*9/L    Absolute Monocytes 0.3 0.2 - 0.8 10*9/L    Absolute Eosinophils 0.3 0.0 - 0.4 10*9/L  Absolute Basophils 0.0 0.0 - 0.1 10*9/L    Large Unstained Cells 1 0 - 4 %    Microcytosis Slight (A) Not Present    Anisocytosis Moderate (A) Not Present    Hypochromasia Marked (A) Not Present   POCT Glucose    Collection Time: 01/28/20  6:08 AM   Result Value Ref Range    Glucose, POC 98 70 - 179 mg/dL       Imaging: none      ATTENDING ATTESTATION:  I was the supervising physician in the delivery of the service.  ______________________________

## 2020-01-28 NOTE — Unmapped (Shared)
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 He is POD4 following a open primary ventral hernia repair with negative pressure Prevena wound vac placement.      Plan:  Neuro-  - Pain management: Continue acetaminophen, IV dilaudid and oxycodone prn  HEENT-  - Continue to monitor for improving tenderness on the posterior scalp   CV-  - Continue to monitor vital signs   Respiratory-  - Continue to monitor vital signs  GI-  - Nutrition:TMP initiated, recheck electrolyte levels following TPN administration; Potassium supplement given to patient today  - NG tube: Continue NPO with emphasis of minimal/no water intake PO, NG tube clamped, monitor tolerance of the patient to NG clamp, if tolerating NG tube clamp plan to remove NG tube  - Prevena wound vac: Continue to monitor; plan to remove POD5-7  - JP Drain: Continue to monitor drainage amount and characteristics   - Continue to monitor for return of bowel function   GU-  - Continue to monitor voiding habits  Nephrology-   - AKI resolved: continue to monitor SCr  Endocrine-  - DM: continue to hold home metformin   ID-  - Afebrile  - Continue to trend WBC levels  Heme-  - Metastatic melanoma: stable; continue dabrafenib/trametinib  Dispo-   - Maintain floor status and continue to monitor     Subjective:  No acute events overnight. Tenderness on the posterior scalp continues to improve per patient. Patient reports small bowel movement overnight and continued passing of flatus. Patient states he has been drinking water and eating ice chips due to his mouth feeling dry.    Objective:  Vitals:  Temp- 36.1 C  HR- 82  RR- 18  BP- 140/82  SpO2- 100%    Ins/Outs:  Ins: 1630 [PO: 100, IV: 1010, IV Piggyback: 120]  Outs: 4245 [Urine: 2225, Emesis/NG tube: 1950, Drains: 70]    Physical Exam:  General- laying comfortably on exam table in no apparent distress  Neuro- alert and oriented x3  HEENT- improved tenderness on posterior scalp   CV- Regular rate and rhythm. S1 and S1 noted with no murmurs, rubs or gallops  Pulmonary- Normal work of breathing on room air. No adventitious sounds noted on auscultation  GI- Minimal tenderness to palpation throughout the abdomen. Prevena wound vac in place held by abdominal binder without complications. JP drain producing serous fluid output. NG tube producing light brown output  Heme- PICC line in place with no complications     Diagnostic data reviewed: labs within previous 24 hours

## 2020-01-28 NOTE — Unmapped (Signed)
A&Ox4 VSS on RA.  No acute events this shift. Patient is NPO.  NG tube remains intact, LCWS with moderate output.  DL PICC RUE C/D/I, infusing D5 1/2NS w 20K as ordered.  TPN scheduled to start at 0000 01/28/2020.  Provena WV to abdomen C/D/I, with abdominal binder in place.  JP drain to LUQ intact.  Safety measures remain in place, patient free from falls.  Will continue to monitor progress.     Problem: Pain Acute  Goal: Acceptable Pain Control and Functional Ability  Outcome: Progressing     Problem: VTE (Venous Thromboembolism)  Goal: VTE (Venous Thromboembolism) Symptom Resolution  Outcome: Progressing  Intervention: Prevent or Manage VTE (Venous Thromboembolism)  Recent Flowsheet Documentation  Taken 01/27/2020 1200 by Bo Mcclintock, RN  Anti-Embolism Device Type: SCD, Knee  Anti-Embolism Intervention: On  Anti-Embolism Device Location: BLE  Taken 01/27/2020 1000 by Bo Mcclintock, RN  Anti-Embolism Device Type: SCD, Knee  Anti-Embolism Intervention: On  Anti-Embolism Device Location: BLE  Taken 01/27/2020 0800 by Bo Mcclintock, RN  Anti-Embolism Device Type: SCD, Knee  Anti-Embolism Intervention: On  Anti-Embolism Device Location: BLE     Problem: Self-Care Deficit  Goal: Improved Ability to Complete Activities of Daily Living  Outcome: Progressing

## 2020-01-28 NOTE — Unmapped (Signed)
No acute events overnight. Pt free from falls or injury this shift. TPN infusing through PICC at 60ml/hr. Provena wound vac in place to abdomen, 0 output, abdominal binder in place. LLQ JP in place with serosanguinous output. NGT in place, 1L output. DPCA in use, patient infrequently using it. SCDs in place, VTE prevention education provided. VSS, urine output adequate. Will continue to monitor.

## 2020-01-29 LAB — CBC W/ AUTO DIFF
BASOPHILS ABSOLUTE COUNT: 0 10*9/L (ref 0.0–0.1)
BASOPHILS RELATIVE PERCENT: 0.5 %
EOSINOPHILS ABSOLUTE COUNT: 0.3 10*9/L (ref 0.0–0.4)
EOSINOPHILS RELATIVE PERCENT: 4.3 %
HEMATOCRIT: 30.7 % — ABNORMAL LOW (ref 41.0–53.0)
HEMOGLOBIN: 9.7 g/dL — ABNORMAL LOW (ref 13.5–17.5)
LARGE UNSTAINED CELLS: 1 % (ref 0–4)
LYMPHOCYTES ABSOLUTE COUNT: 0.7 10*9/L — ABNORMAL LOW (ref 1.5–5.0)
LYMPHOCYTES RELATIVE PERCENT: 10.4 %
MEAN CORPUSCULAR HEMOGLOBIN CONC: 31.6 g/dL (ref 31.0–37.0)
MEAN CORPUSCULAR HEMOGLOBIN: 26.2 pg (ref 26.0–34.0)
MEAN CORPUSCULAR VOLUME: 82.8 fL (ref 80.0–100.0)
MEAN PLATELET VOLUME: 10.2 fL — ABNORMAL HIGH (ref 7.0–10.0)
MONOCYTES ABSOLUTE COUNT: 0.2 10*9/L (ref 0.2–0.8)
MONOCYTES RELATIVE PERCENT: 3.5 %
NEUTROPHILS ABSOLUTE COUNT: 5.4 10*9/L (ref 2.0–7.5)
NEUTROPHILS RELATIVE PERCENT: 80.4 %
PLATELET COUNT: 255 10*9/L (ref 150–440)
RED BLOOD CELL COUNT: 3.71 10*12/L — ABNORMAL LOW (ref 4.50–5.90)
RED CELL DISTRIBUTION WIDTH: 18.9 % — ABNORMAL HIGH (ref 12.0–15.0)
WBC ADJUSTED: 6.7 10*9/L (ref 4.5–11.0)

## 2020-01-29 LAB — BASIC METABOLIC PANEL
ANION GAP: 9 mmol/L (ref 5–14)
BLOOD UREA NITROGEN: 11 mg/dL (ref 9–23)
BUN / CREAT RATIO: 13
CALCIUM: 5.5 mg/dL — CL (ref 8.7–10.4)
CHLORIDE: 110 mmol/L — ABNORMAL HIGH (ref 98–107)
CO2: 24 mmol/L (ref 20.0–31.0)
CREATININE: 0.87 mg/dL
EGFR CKD-EPI AA MALE: >90 mL/min/{1.73_m2} (ref >=60)
EGFR CKD-EPI NON-AA MALE: >90 mL/min/{1.73_m2} (ref >=60)
GLUCOSE RANDOM: 117 mg/dL (ref 70–179)
POTASSIUM: 3.1 mmol/L — ABNORMAL LOW (ref 3.4–4.5)
SODIUM: 143 mmol/L (ref 135–145)

## 2020-01-29 LAB — MAGNESIUM: MAGNESIUM: 1.8 mg/dL (ref 1.6–2.6)

## 2020-01-29 LAB — PHOSPHORUS: PHOSPHORUS: 5.7 mg/dL — ABNORMAL HIGH (ref 2.4–5.1)

## 2020-01-29 MED ADMIN — sodium chloride (NS) 0.9 % flush 10 mL: 10 mL | INTRAVENOUS | @ 23:00:00 | Stop: 2020-02-02

## 2020-01-29 MED ADMIN — Adult 3-in-1 PN: INTRAVENOUS | @ 06:00:00 | Stop: 2020-01-30

## 2020-01-29 MED ADMIN — white petrolatum (AQUAPHOR) 41 % ointment 1 application: 1 | TOPICAL | @ 14:00:00 | Stop: 2020-02-02

## 2020-01-29 MED ADMIN — sodium chloride (NS) 0.9 % flush 10 mL: 10 mL | INTRAVENOUS | @ 17:00:00 | Stop: 2020-02-02

## 2020-01-29 MED ADMIN — sodium chloride (NS) 0.9 % flush 10 mL: 10 mL | INTRAVENOUS | @ 08:00:00 | Stop: 2020-02-02

## 2020-01-29 MED ADMIN — pantoprazole (PROTONIX) injection 40 mg: 40 mg | INTRAVENOUS | @ 11:00:00 | Stop: 2020-02-02

## 2020-01-29 MED ADMIN — enoxaparin (LOVENOX) syringe 40 mg: 40 mg | SUBCUTANEOUS | @ 02:00:00 | Stop: 2020-02-02

## 2020-01-29 MED ADMIN — calcium gluconate 1 g in sodium chloride (NS) 0.9 % 100 mL IVPB: 1 g | INTRAVENOUS | @ 14:00:00 | Stop: 2020-01-29

## 2020-01-29 MED ADMIN — DULoxetine (CYMBALTA) DR capsule 30 mg: 30 mg | ORAL | @ 02:00:00 | Stop: 2020-02-02

## 2020-01-29 MED ADMIN — pantoprazole (PROTONIX) injection 40 mg: 40 mg | INTRAVENOUS | @ 20:00:00 | Stop: 2020-02-02

## 2020-01-29 MED ADMIN — acetaminophen (OFIRMEV) 10 mg/mL injection 1,000 mg: 1000 mg | INTRAVENOUS | @ 23:00:00 | Stop: 2020-01-30

## 2020-01-29 MED ADMIN — potassium chloride 20 mEq in 100 mL IVPB Premix: 20 meq | INTRAVENOUS | @ 17:00:00 | Stop: 2020-01-29

## 2020-01-29 MED ADMIN — acetaminophen (OFIRMEV) 10 mg/mL injection 1,000 mg: 1000 mg | INTRAVENOUS | @ 06:00:00 | Stop: 2020-01-29

## 2020-01-29 MED ADMIN — potassium chloride 20 mEq in 100 mL IVPB Premix: 20 meq | INTRAVENOUS | @ 15:00:00 | Stop: 2020-01-29

## 2020-01-29 MED ADMIN — DULoxetine (CYMBALTA) DR capsule 30 mg: 30 mg | ORAL | @ 14:00:00 | Stop: 2020-02-02

## 2020-01-29 MED ADMIN — sodium chloride (NS) 0.9 % infusion: 65 mL/h | INTRAVENOUS | @ 14:00:00 | Stop: 2020-02-01

## 2020-01-29 MED ADMIN — enoxaparin (LOVENOX) syringe 40 mg: 40 mg | SUBCUTANEOUS | @ 14:00:00 | Stop: 2020-02-02

## 2020-01-29 MED ADMIN — acetaminophen (OFIRMEV) 10 mg/mL injection 1,000 mg: 1000 mg | INTRAVENOUS | @ 17:00:00 | Stop: 2020-01-30

## 2020-01-29 NOTE — Unmapped (Signed)
A&Ox4 VSS on RA.  No acute events this shift. Patient is on a trial for sips of clears.  NG tube remain intact and clamped. Should nausea/vomiting occur will unclamp and resume suction.  DL PICC RUE C/D/I, infusing LR@ 34mL/h as ordered.  TPN continues to run at 16ml/h.  Total fluid parameters are at 155ml/h.   Provena WV to abdomen C/D/I, with abdominal binder in place.  JP drain to LUQ intact, .  Safety measures remain in place, patient free from falls.  Will continue to monitor progress.     Problem: Pain Acute  Goal: Acceptable Pain Control and Functional Ability  Outcome: Progressing     Problem: VTE (Venous Thromboembolism)  Goal: VTE (Venous Thromboembolism) Symptom Resolution  Outcome: Progressing  Intervention: Prevent or Manage VTE (Venous Thromboembolism)  Recent Flowsheet Documentation  Taken 01/28/2020 1400 by Bo Mcclintock, RN  Anti-Embolism Device Type: SCD, Knee  Anti-Embolism Intervention: On  Anti-Embolism Device Location: BLE  Taken 01/28/2020 1200 by Bo Mcclintock, RN  Anti-Embolism Device Type: SCD, Knee  Anti-Embolism Intervention: On  Anti-Embolism Device Location: BLE  Taken 01/28/2020 1000 by Bo Mcclintock, RN  Anti-Embolism Device Type: SCD, Knee  Anti-Embolism Intervention: On  Anti-Embolism Device Location: BLE  Taken 01/28/2020 0800 by Bo Mcclintock, RN  Anti-Embolism Device Type: SCD, Knee  Anti-Embolism Intervention: On  Anti-Embolism Device Location: BLE     Problem: Self-Care Deficit  Goal: Improved Ability to Complete Activities of Daily Living  Outcome: Progressing

## 2020-01-29 NOTE — Unmapped (Signed)
Ambulating in hallways. Voiding. Denies pain at this time. NGT to LCWS.Wound vac to midline provena. NPO maintained. Will continue to monitor  Problem: Pain Acute  Goal: Acceptable Pain Control and Functional Ability  Outcome: Progressing     Problem: VTE (Venous Thromboembolism)  Goal: VTE (Venous Thromboembolism) Symptom Resolution  Outcome: Progressing     Problem: Self-Care Deficit  Goal: Improved Ability to Complete Activities of Daily Living  Outcome: Progressing

## 2020-01-29 NOTE — Unmapped (Signed)
[place] TRAUMA, ACUTE CARE, and GENERAL SURGERY   - Surgery Daily Progress Note -  01/29/2020       Admit Date: 01/18/2020, Hospital Day: 12  Hospital Service: Peter Garter [facility])      Assessment     Kenneth Fitzgerald [facility] 45 y.o. 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 He underwent primary open ventral hernia repair with Dr. 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 Carlynn Purl on 01/24/20  - Diet: Continue NG today  - PICC paced 1/12 and TPN started 1/13.   - Fluids: mIVF and TPN - TFO 125 in place   - Zofran as needed for nausea   - Replete lytes prn: IV K+, calcium for hypokalemia, hypocalcemia today   - Bowel regimen: on hold while AROBF  - continue provena wound vac until POD5 (1/15) - removed today     *Crohn's disease  -  Hold home sulfasalazine to await return of bowel function    GU: AKI on admission Cr peaked at 1.34 likely due to nausea, vomiting and poor PO intake in the setting of SBO (resolved)  - Adequate UOP.   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   - Dexamethasone discontinued 1/10 following taper  - [facility] at discharge for final recs and follow up needs    ID:  - Afebrile.  - WBC- WNL    PPx:   - Heparin     Dispo: Floor status  PT/OT: not indicated  Barriers to discharge: AROBF, diet advancement  CM/SW assisting in discharge planning.     Contact: Gabriel Rung Lawanna Cecere  [phone]      Subjective:   NAEON. Wound vac removed. NG clamped overnight and patient nauseous this am. NG put to suction this am with 400 out. Will keep NG in today.    Objective:     Vital signs in last 24 hours:  Temp:  [35.8 ??C-36 ??C] 35.8 ??C  Heart Rate:  [77-90] 77  Resp:  [18-19] 19  BP: (132-145)/(75-90) 138/85  MAP (mmHg):  [98-113] 106  SpO2:  [96 %-98 %] 96 %    Intake/Output  last 24 hours:  I/O last 3 completed shifts:  In: 1983.8 [P.O.:300; I.V.:1010; IV Piggyback:400]  Out: 4360 [Urine:2775; Emesis/NG output:1500; Drains:85]    Physical Exam:  -General:  Appropriate, comfortable and in no apparent distress.   -Neurological: Alert and oriented x3. Moves all 4 extremities spontaneously.   -Pulmonary: Normal work of breathing. No accessory muscle use.  -Abdomen: Soft, slight distended, tender to palpation midline at incision with provena vac in place and SS output.  Drain to LUQ with SS output. NGT with light brown output.    -Extremities: Warm, well perfused, normal skin turgor.    Data Review:    All lab results last 24 hours:    Recent Results (from the past 24 hour(s))   POCT Glucose    Collection Time: 01/28/20 11:31 AM   Result Value Ref Range    Glucose, POC 105 70 - 179 mg/dL   POCT Glucose    Collection Time: 01/28/20  3:51 PM   Result Value Ref Range    Glucose, POC 102 70 - 179 mg/dL   POCT Glucose    Collection Time: 01/28/20  8:47 PM   Result Value Ref Range    Glucose, POC 104 70 - 179 mg/dL   CBC w/ Differential    Collection Time: 01/29/20  4:50 AM   Result Value Ref Range    WBC 6.7 4.5 - 11.0 10*9/L    RBC 3.71 (L) 4.50 - 5.90 10*12/L    HGB 9.7 (L) 13.5 - 17.5 g/dL    HCT 19.1 (L) 47.8 - 53.0 %    MCV 82.8 80.0 - 100.0 fL    MCH 26.2 26.0 - 34.0 pg    MCHC 31.6 31.0 - 37.0 g/dL    RDW 29.5 (H) 62.1 - 15.0 %    MPV 10.2 (H) 7.0 - 10.0 fL    Platelet 255 150 - 440 10*9/L    Neutrophils % 80.4 %    Lymphocytes % 10.4 %    Monocytes % 3.5 %    Eosinophils % 4.3 %    Basophils % 0.5 %    Neutrophil Left Shift 1+ (A) Not Present    Absolute Neutrophils 5.4 2.0 - 7.5 10*9/L    Absolute Lymphocytes 0.7 (L) 1.5 - 5.0 10*9/L    Absolute Monocytes 0.2 0.2 - 0.8 10*9/L    Absolute Eosinophils 0.3 0.0 - 0.4 10*9/L    Absolute Basophils 0.0 0.0 - 0.1 10*9/L    Large Unstained Cells 1 0 - 4 %    Microcytosis Slight (A) Not Present    Anisocytosis Moderate (A) Not Present    Hypochromasia Marked (A) Not Present   Magnesium Level    Collection Time: 01/29/20  4:54 AM   Result Value Ref Range    Magnesium 1.8 1.6 - 2.6 mg/dL   Phosphorus Level    Collection Time: 01/29/20  4:54 AM   Result Value Ref Range    Phosphorus 5.7 (H) 2.4 - 5.1 mg/dL   Basic Metabolic Panel    Collection Time: 01/29/20  4:54 AM   Result Value Ref Range    Sodium 143 135 - 145 mmol/L    Potassium 3.1 (L) 3.4 - 4.5 mmol/L    Chloride 110 (H) 98 - 107 mmol/L    CO2 24.0 20.0 - 31.0 mmol/L    Anion Gap 9 5 - 14 mmol/L    BUN 11 9 - 23 mg/dL    Creatinine 3.08 6.57 - 1.10  mg/dL    BUN/Creatinine Ratio 13     EGFR CKD-EPI Non-African American, Male >90 >=60 mL/min/1.33m2    EGFR CKD-EPI African American, Male >90 >=60 mL/min/1.87m2    Glucose 117 70 - 179 mg/dL    Calcium 5.5 (LL) 8.7 - 10.4 mg/dL   POCT Glucose    Collection Time: 01/29/20  7:10 AM   Result Value Ref Range    Glucose, POC 108 70 - 179 mg/dL POCT Glucose    Collection Time: 01/29/20 11:06 AM   Result Value Ref Range    Glucose, POC 101 70 - 179 mg/dL       Imaging: none

## 2020-01-30 LAB — IONIZED CALCIUM VENOUS: CALCIUM IONIZED VENOUS (MG/DL): 3.02 mg/dL — CL (ref 4.40–5.40)

## 2020-01-30 LAB — CBC W/ AUTO DIFF
BASOPHILS ABSOLUTE COUNT: 0 10*9/L (ref 0.0–0.1)
BASOPHILS RELATIVE PERCENT: 0.6 %
EOSINOPHILS ABSOLUTE COUNT: 0.3 10*9/L (ref 0.0–0.4)
EOSINOPHILS RELATIVE PERCENT: 5.1 %
HEMATOCRIT: 29.7 % — ABNORMAL LOW (ref 41.0–53.0)
HEMOGLOBIN: 9.4 g/dL — ABNORMAL LOW (ref 13.5–17.5)
LARGE UNSTAINED CELLS: 2 % (ref 0–4)
LYMPHOCYTES ABSOLUTE COUNT: 0.7 10*9/L — ABNORMAL LOW (ref 1.5–5.0)
LYMPHOCYTES RELATIVE PERCENT: 11.3 %
MEAN CORPUSCULAR HEMOGLOBIN CONC: 31.5 g/dL (ref 31.0–37.0)
MEAN CORPUSCULAR HEMOGLOBIN: 25.7 pg — ABNORMAL LOW (ref 26.0–34.0)
MEAN CORPUSCULAR VOLUME: 81.4 fL (ref 80.0–100.0)
MEAN PLATELET VOLUME: 10.2 fL — ABNORMAL HIGH (ref 7.0–10.0)
MONOCYTES ABSOLUTE COUNT: 0.2 10*9/L (ref 0.2–0.8)
MONOCYTES RELATIVE PERCENT: 3.6 %
NEUTROPHILS ABSOLUTE COUNT: 4.6 10*9/L (ref 2.0–7.5)
NEUTROPHILS RELATIVE PERCENT: 78 %
PLATELET COUNT: 216 10*9/L (ref 150–440)
RED BLOOD CELL COUNT: 3.65 10*12/L — ABNORMAL LOW (ref 4.50–5.90)
RED CELL DISTRIBUTION WIDTH: 18.7 % — ABNORMAL HIGH (ref 12.0–15.0)
WBC ADJUSTED: 5.9 10*9/L (ref 4.5–11.0)

## 2020-01-30 LAB — BASIC METABOLIC PANEL
ANION GAP: 9 mmol/L (ref 5–14)
BLOOD UREA NITROGEN: 11 mg/dL (ref 9–23)
BUN / CREAT RATIO: 14
CALCIUM: 5.4 mg/dL — CL (ref 8.7–10.4)
CHLORIDE: 107 mmol/L (ref 98–107)
CO2: 23 mmol/L (ref 20.0–31.0)
CREATININE: 0.78 mg/dL
EGFR CKD-EPI AA MALE: >90 mL/min/{1.73_m2} (ref >=60)
EGFR CKD-EPI NON-AA MALE: >90 mL/min/{1.73_m2} (ref >=60)
GLUCOSE RANDOM: 107 mg/dL (ref 70–179)
POTASSIUM: 3.3 mmol/L — ABNORMAL LOW (ref 3.4–4.5)
SODIUM: 139 mmol/L (ref 135–145)

## 2020-01-30 LAB — PHOSPHORUS: PHOSPHORUS: 5.5 mg/dL — ABNORMAL HIGH (ref 2.4–5.1)

## 2020-01-30 LAB — MAGNESIUM: MAGNESIUM: 1.7 mg/dL (ref 1.6–2.6)

## 2020-01-30 MED ADMIN — pantoprazole (PROTONIX) injection 40 mg: 40 mg | INTRAVENOUS | @ 10:00:00 | Stop: 2020-02-02

## 2020-01-30 MED ADMIN — sodium chloride (NS) 0.9 % infusion: 65 mL/h | INTRAVENOUS | @ 21:00:00 | Stop: 2020-02-01

## 2020-01-30 MED ADMIN — sodium chloride (NS) 0.9 % flush 10 mL: 10 mL | INTRAVENOUS | Stop: 2020-02-02

## 2020-01-30 MED ADMIN — levothyroxine (SYNTHROID) injection: 125 ug | INTRAVENOUS | Stop: 2020-01-30

## 2020-01-30 MED ADMIN — acetaminophen (OFIRMEV) 10 mg/mL injection 1,000 mg: 1000 mg | INTRAVENOUS | @ 21:00:00 | Stop: 2020-01-31

## 2020-01-30 MED ADMIN — acetaminophen (OFIRMEV) 10 mg/mL injection 1,000 mg: 1000 mg | INTRAVENOUS | @ 05:00:00 | Stop: 2020-01-30

## 2020-01-30 MED ADMIN — calcium gluconate 1 g in sodium chloride (NS) 0.9 % 100 mL IVPB: 1 g | INTRAVENOUS | @ 14:00:00 | Stop: 2020-01-30

## 2020-01-30 MED ADMIN — sodium chloride (NS) 0.9 % flush 10 mL: 10 mL | INTRAVENOUS | @ 21:00:00 | Stop: 2020-02-02

## 2020-01-30 MED ADMIN — sodium chloride (NS) 0.9 % flush 10 mL: 10 mL | INTRAVENOUS | @ 08:00:00 | Stop: 2020-02-02

## 2020-01-30 MED ADMIN — acetaminophen (OFIRMEV) 10 mg/mL injection 1,000 mg: 1000 mg | INTRAVENOUS | @ 10:00:00 | Stop: 2020-01-30

## 2020-01-30 MED ADMIN — potassium chloride 20 mEq in 100 mL IVPB Premix: 20 meq | INTRAVENOUS | @ 14:00:00 | Stop: 2020-01-30

## 2020-01-30 MED ADMIN — DULoxetine (CYMBALTA) DR capsule 30 mg: 30 mg | ORAL | @ 02:00:00 | Stop: 2020-02-02

## 2020-01-30 MED ADMIN — potassium chloride 20 mEq in 100 mL IVPB Premix: 20 meq | INTRAVENOUS | @ 18:00:00 | Stop: 2020-01-30

## 2020-01-30 MED ADMIN — enoxaparin (LOVENOX) syringe 40 mg: 40 mg | SUBCUTANEOUS | @ 02:00:00 | Stop: 2020-02-02

## 2020-01-30 MED ADMIN — DULoxetine (CYMBALTA) DR capsule 30 mg: 30 mg | ORAL | @ 14:00:00 | Stop: 2020-02-02

## 2020-01-30 MED ADMIN — Adult 3-in-1 PN: INTRAVENOUS | @ 05:00:00 | Stop: 2020-01-31

## 2020-01-30 MED ADMIN — enoxaparin (LOVENOX) syringe 40 mg: 40 mg | SUBCUTANEOUS | @ 14:00:00 | Stop: 2020-02-02

## 2020-01-30 MED ADMIN — pantoprazole (PROTONIX) injection 40 mg: 40 mg | INTRAVENOUS | @ 21:00:00 | Stop: 2020-02-02

## 2020-01-30 MED ADMIN — white petrolatum (AQUAPHOR) 41 % ointment 1 application: 1 | TOPICAL | @ 18:00:00 | Stop: 2020-02-02

## 2020-01-30 NOTE — Unmapped (Signed)
Pt VSS, free of falls, alert and oriented, wound vac discontinued by team, dry dressing, multiple bowel movements today, no nausea, NGT clamped all day, team notified, will continue to monitor  Problem: Pain Acute  Goal: Acceptable Pain Control and Functional Ability  Outcome: Progressing     Problem: VTE (Venous Thromboembolism)  Goal: VTE (Venous Thromboembolism) Symptom Resolution  Outcome: Progressing     Problem: Self-Care Deficit  Goal: Improved Ability to Complete Activities of Daily Living  Outcome: Progressing     Problem: Adult Inpatient Plan of Care  Goal: Absence of Hospital-Acquired Illness or Injury  Intervention: Identify and Manage Fall Risk  Recent Flowsheet Documentation  Taken 01/29/2020 1800 by Lajean Manes, RN  Safety Interventions: low bed  Taken 01/29/2020 1600 by Lajean Manes, RN  Safety Interventions: low bed  Taken 01/29/2020 1400 by Lajean Manes, RN  Safety Interventions: low bed  Taken 01/29/2020 1000 by Lajean Manes, RN  Safety Interventions: low bed  Taken 01/29/2020 0800 by Lajean Manes, RN  Safety Interventions: low bed

## 2020-01-30 NOTE — Unmapped (Signed)
Ambulating in hallways. Voiding. Denies pain at this time. NGT to LCWS  clamped and tolerating sips of fluids. Denies nausea and vomiting. No acute events overnight. All safety measures in place.          Will continue to monitor  Problem: Pain Acute  Goal: Acceptable Pain Control and Functional Ability  Outcome: Progressing     Problem: VTE (Venous Thromboembolism)  Goal: VTE (Venous Thromboembolism) Symptom Resolution  Outcome: Progressing     Problem: Self-Care Deficit  Goal: Improved Ability to Complete Activities of Daily Living  Outcome: Progressing

## 2020-01-30 NOTE — Unmapped (Signed)
[place] TRAUMA, ACUTE CARE, and GENERAL SURGERY   - Surgery Daily Progress Note -  01/30/2020       Admit Date: 01/18/2020, Hospital Day: 13  Hospital Service: Peter Garter [facility])      Assessment     Kenneth Fitzgerald 45 y.o. 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 He underwent primary open ventral hernia repair with Dr. 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 Carlynn Purl on 01/24/20  - Diet: DC NGT, advance to sips of clears  - PICC paced 1/12 and TPN started 1/13. Renew daily  - Fluids: mIVF and TPN - TFO 125 in place   - Zofran as needed for nausea   - Replete lytes prn: IV K+, calcium for hypokalemia, hypocalcemia today   - Bowel regimen: on hold while AROBF  - continue provena wound vac until POD5 (1/15) - removed 1/15    *Crohn's disease  -  Hold home sulfasalazine while awaiting return of bowel function    GU: AKI on admission Cr peaked at 1.34 likely due to nausea, vomiting and poor PO intake in the setting of SBO (resolved)  - Adequate UOP.   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   - Dexamethasone discontinued 1/10 following taper  - [facility] at discharge for final recs and follow up needs    ID:  - Afebrile.  - WBC- WNL    PPx:   - Heparin     Dispo: Floor status  PT/OT: not indicated  Barriers to discharge: AROBF, diet advancement  CM/SW assisting in discharge planning.     Contact: [facility]  [name]  [phone]      Subjective:   NAEON. No nausea or vomiting while NGT clamped. VSS.    Objective:     Vital signs in last 24 hours:  Temp:  [35.8 ??C-36.6 ??C] 36.6 ??C  Heart Rate:  [84-86] 86  Resp:  [16-18] 16  BP: (133-151)/(78-90) 133/78  MAP (mmHg):  [99-111] 99  SpO2:  [97 %-100 %] 97 %    Intake/Output last 24 hours:  I/O last 3 completed shifts:  In: 250 [P.O.:250]  Out: 845 [Urine:300; Emesis/NG output:400; Drains:145]    Physical Exam:  -General:  Appropriate, comfortable and in no apparent distress.   -Neurological: Alert and oriented x3. Moves all 4 extremities spontaneously.   -Pulmonary: Normal work of breathing. No accessory muscle use.  -Abdomen: Soft, slight distended, tender to palpation midline at incision. Drain to LUQ with SS output.  -Extremities: Warm, well perfused, normal skin turgor.    Data Review:    All lab results last 24 hours:    Recent Results (from the past 24 hour(s))   POCT Glucose    Collection Time: 01/29/20  4:31 PM   Result Value Ref Range    Glucose, POC 104 70 - 179 mg/dL   POCT Glucose    Collection Time: 01/30/20 12:18 AM   Result Value Ref Range    Glucose, POC 108 70 - 179 mg/dL   Magnesium Level    Collection Time: 01/30/20  5:17 AM   Result Value Ref Range    Magnesium 1.7 1.6 - 2.6 mg/dL   Phosphorus Level    Collection Time: 01/30/20  5:17 AM   Result Value Ref Range    Phosphorus 5.5 (H) 2.4 - 5.1 mg/dL   Basic Metabolic Panel    Collection Time: 01/30/20  5:17 AM   Result Value Ref Range    Sodium 139 135 - 145 mmol/L    Potassium 3.3 (L) 3.4 - 4.5 mmol/L    Chloride 107 98 - 107 mmol/L    CO2 23.0 20.0 - 31.0 mmol/L    Anion Gap 9 5 - 14 mmol/L    BUN 11 9 - 23 mg/dL    Creatinine 9.14 7.82 - 1.10 mg/dL    BUN/Creatinine Ratio 14     EGFR CKD-EPI Non-African American, Male >90 >=60 mL/min/1.6m2    EGFR CKD-EPI African American, Male >90 >=60 mL/min/1.30m2    Glucose 107 70 - 179 mg/dL    Calcium 5.4 (LL) 8.7 - 10.4 mg/dL   CBC w/ Differential    Collection Time: 01/30/20  5:17 AM   Result Value Ref Range    WBC 5.9 4.5 - 11.0 10*9/L    RBC 3.65 (L) 4.50 - 5.90 10*12/L    HGB 9.4 (L) 13.5 - 17.5 g/dL    HCT 95.6 (L) 21.3 - 53.0 %    MCV 81.4 80.0 - 100.0 fL    MCH 25.7 (L) 26.0 - 34.0 pg    MCHC 31.5 31.0 - 37.0 g/dL    RDW 08.6 (H) 57.8 - 15.0 %    MPV 10.2 (H) 7.0 - 10.0 fL    Platelet 216 150 - 440 10*9/L    Neutrophils % 78.0 %    Lymphocytes % 11.3 %    Monocytes % 3.6 %    Eosinophils % 5.1 %    Basophils % 0.6 %    Neutrophil Left Shift 1+ (A) Not Present    Absolute Neutrophils 4.6 2.0 - 7.5 10*9/L    Absolute Lymphocytes 0.7 (L) 1.5 - 5.0 10*9/L    Absolute Monocytes 0.2 0.2 - 0.8 10*9/L    Absolute Eosinophils 0.3 0.0 - 0.4 10*9/L    Absolute Basophils 0.0 0.0 - 0.1 10*9/L    Large Unstained Cells 2 0 - 4 %    Microcytosis Moderate (A) Not Present    Anisocytosis Moderate (A) Not Present    Hypochromasia Moderate (A) Not Present   POCT Glucose    Collection Time: 01/30/20  6:11 AM  Result Value Ref Range    Glucose, POC 96 70 - 179 mg/dL   POCT Glucose    Collection Time: 01/30/20 12:31 PM   Result Value Ref Range    Glucose, POC 132 70 - 179 mg/dL       Imaging: none

## 2020-01-31 LAB — BASIC METABOLIC PANEL
ANION GAP: 10 mmol/L (ref 5–14)
ANION GAP: 7 mmol/L (ref 5–14)
ANION GAP: 9 mmol/L (ref 5–14)
BLOOD UREA NITROGEN: 11 mg/dL (ref 9–23)
BLOOD UREA NITROGEN: 13 mg/dL (ref 9–23)
BLOOD UREA NITROGEN: 14 mg/dL (ref 9–23)
BUN / CREAT RATIO: 14
BUN / CREAT RATIO: 16
BUN / CREAT RATIO: 18
CALCIUM: 5.8 mg/dL — CL (ref 8.7–10.4)
CALCIUM: 6 mg/dL — ABNORMAL LOW (ref 8.7–10.4)
CALCIUM: 6.4 mg/dL — ABNORMAL LOW (ref 8.7–10.4)
CHLORIDE: 107 mmol/L (ref 98–107)
CHLORIDE: 103 mmol/L (ref 98–107)
CHLORIDE: 107 mmol/L (ref 98–107)
CO2: 22 mmol/L (ref 20.0–31.0)
CO2: 20 mmol/L (ref 20.0–31.0)
CO2: 22 mmol/L (ref 20.0–31.0)
CREATININE: 0.79 mg/dL
CREATININE: 0.79 mg/dL
CREATININE: 0.78 mg/dL
EGFR CKD-EPI AA MALE: >90 mL/min/{1.73_m2} (ref >=60)
EGFR CKD-EPI AA MALE: >90 mL/min/{1.73_m2} (ref >=60)
EGFR CKD-EPI AA MALE: >90 mL/min/{1.73_m2} (ref >=60)
EGFR CKD-EPI NON-AA MALE: >90 mL/min/{1.73_m2} (ref >=60)
EGFR CKD-EPI NON-AA MALE: >90 mL/min/{1.73_m2} (ref >=60)
EGFR CKD-EPI NON-AA MALE: >90 mL/min/{1.73_m2} (ref >=60)
GLUCOSE RANDOM: 92 mg/dL (ref 70–179)
GLUCOSE RANDOM: 474 mg/dL (ref 70–179)
GLUCOSE RANDOM: 94 mg/dL (ref 70–179)
POTASSIUM: 3.6 mmol/L (ref 3.4–4.5)
POTASSIUM: 7.1 mmol/L (ref 3.4–4.5)
POTASSIUM: 4 mmol/L (ref 3.4–4.5)
SODIUM: 139 mmol/L (ref 135–145)
SODIUM: 130 mmol/L — ABNORMAL LOW (ref 135–145)
SODIUM: 138 mmol/L (ref 135–145)

## 2020-01-31 LAB — CBC W/ AUTO DIFF
BASOPHILS ABSOLUTE COUNT: 0 10*9/L (ref 0.0–0.1)
BASOPHILS RELATIVE PERCENT: 0.4 %
EOSINOPHILS ABSOLUTE COUNT: 0.3 10*9/L (ref 0.0–0.4)
EOSINOPHILS RELATIVE PERCENT: 5.7 %
HEMATOCRIT: 29.4 % — ABNORMAL LOW (ref 41.0–53.0)
HEMOGLOBIN: 9.4 g/dL — ABNORMAL LOW (ref 13.5–17.5)
LARGE UNSTAINED CELLS: 2 % (ref 0–4)
LYMPHOCYTES ABSOLUTE COUNT: 0.8 10*9/L — ABNORMAL LOW (ref 1.5–5.0)
LYMPHOCYTES RELATIVE PERCENT: 13.9 %
MEAN CORPUSCULAR HEMOGLOBIN CONC: 31.8 g/dL (ref 31.0–37.0)
MEAN CORPUSCULAR HEMOGLOBIN: 26.2 pg (ref 26.0–34.0)
MEAN CORPUSCULAR VOLUME: 82.2 fL (ref 80.0–100.0)
MEAN PLATELET VOLUME: 9.7 fL (ref 7.0–10.0)
MONOCYTES ABSOLUTE COUNT: 0.3 10*9/L (ref 0.2–0.8)
MONOCYTES RELATIVE PERCENT: 4.7 %
NEUTROPHILS ABSOLUTE COUNT: 4.2 10*9/L (ref 2.0–7.5)
NEUTROPHILS RELATIVE PERCENT: 73.7 %
PLATELET COUNT: 223 10*9/L (ref 150–440)
RED BLOOD CELL COUNT: 3.58 10*12/L — ABNORMAL LOW (ref 4.50–5.90)
RED CELL DISTRIBUTION WIDTH: 18.8 % — ABNORMAL HIGH (ref 12.0–15.0)
WBC ADJUSTED: 5.6 10*9/L (ref 4.5–11.0)

## 2020-01-31 LAB — IONIZED CALCIUM VENOUS: CALCIUM IONIZED VENOUS (MG/DL): 3.13 mg/dL — CL (ref 4.40–5.40)

## 2020-01-31 LAB — PHOSPHORUS: PHOSPHORUS: 5.2 mg/dL — ABNORMAL HIGH (ref 2.4–5.1)

## 2020-01-31 LAB — MAGNESIUM: MAGNESIUM: 1.8 mg/dL (ref 1.6–2.6)

## 2020-01-31 MED ADMIN — enoxaparin (LOVENOX) syringe 40 mg: 40 mg | SUBCUTANEOUS | @ 14:00:00 | Stop: 2020-02-02

## 2020-01-31 MED ADMIN — sodium chloride (NS) 0.9 % flush 10 mL: 10 mL | INTRAVENOUS | @ 18:00:00 | Stop: 2020-02-02

## 2020-01-31 MED ADMIN — DULoxetine (CYMBALTA) DR capsule 30 mg: 30 mg | ORAL | @ 14:00:00 | Stop: 2020-02-02

## 2020-01-31 MED ADMIN — Adult 3-in-1 PN: INTRAVENOUS | @ 05:00:00 | Stop: 2020-01-31

## 2020-01-31 MED ADMIN — enoxaparin (LOVENOX) syringe 40 mg: 40 mg | SUBCUTANEOUS | @ 02:00:00 | Stop: 2020-02-02

## 2020-01-31 MED ADMIN — sodium chloride (NS) 0.9 % flush 10 mL: 10 mL | INTRAVENOUS | @ 09:00:00 | Stop: 2020-02-02

## 2020-01-31 MED ADMIN — oxyCODONE (ROXICODONE) immediate release tablet 10 mg: 10 mg | ORAL | @ 02:00:00 | Stop: 2020-02-02

## 2020-01-31 MED ADMIN — acetaminophen (OFIRMEV) 10 mg/mL injection 1,000 mg: 1000 mg | INTRAVENOUS | @ 07:00:00 | Stop: 2020-01-31

## 2020-01-31 MED ADMIN — calcium gluconate 2 g in sodium chloride (NS) 0.9 % 250 mL IVPB: 2 g | INTRAVENOUS | @ 02:00:00 | Stop: 2020-01-31

## 2020-01-31 MED ADMIN — acetaminophen (TYLENOL) tablet 650 mg: 650 mg | ORAL | @ 14:00:00 | Stop: 2020-02-02

## 2020-01-31 MED ADMIN — pantoprazole (PROTONIX) injection 40 mg: 40 mg | INTRAVENOUS | @ 10:00:00 | Stop: 2020-02-02

## 2020-01-31 MED ADMIN — pantoprazole (PROTONIX) injection 40 mg: 40 mg | INTRAVENOUS | @ 18:00:00 | Stop: 2020-02-02

## 2020-01-31 MED ADMIN — acetaminophen (TYLENOL) tablet 650 mg: 650 mg | ORAL | @ 23:00:00 | Stop: 2020-02-02

## 2020-01-31 MED ADMIN — sodium chloride (NS) 0.9 % flush 10 mL: 10 mL | INTRAVENOUS | @ 22:00:00 | Stop: 2020-02-02

## 2020-01-31 MED ADMIN — white petrolatum (AQUAPHOR) 41 % ointment 1 application: 1 | TOPICAL | @ 14:00:00 | Stop: 2020-02-02

## 2020-01-31 MED ADMIN — calcium gluconate 2 g in sodium chloride (NS) 0.9 % 250 mL IVPB: 2 g | INTRAVENOUS | @ 11:00:00 | Stop: 2020-01-31

## 2020-01-31 MED ADMIN — acetaminophen (OFIRMEV) 10 mg/mL injection 1,000 mg: 1000 mg | INTRAVENOUS | @ 01:00:00 | Stop: 2020-01-31

## 2020-01-31 MED ADMIN — DULoxetine (CYMBALTA) DR capsule 30 mg: 30 mg | ORAL | @ 02:00:00 | Stop: 2020-02-02

## 2020-01-31 MED ADMIN — magnesium oxide (MAG-OX) tablet 400 mg: 400 mg | ORAL | @ 14:00:00 | Stop: 2020-01-31

## 2020-01-31 MED ADMIN — potassium chloride (KLOR-CON) packet 40 mEq: 40 meq | ORAL | @ 14:00:00 | Stop: 2020-01-31

## 2020-01-31 NOTE — Unmapped (Signed)
Pt Vss, alert and oriented, free of falls, NGT out, clear liquids, no nausea, will continue to monitor    Problem: Pain Acute  Goal: Acceptable Pain Control and Functional Ability  Outcome: Progressing     Problem: VTE (Venous Thromboembolism)  Goal: VTE (Venous Thromboembolism) Symptom Resolution  Outcome: Progressing  Intervention: Prevent or Manage VTE (Venous Thromboembolism)  Recent Flowsheet Documentation  Taken 01/30/2020 0800 by Lajean Manes, RN  VTE Prevention/Management:   anticoagulant therapy   ambulation promoted     Problem: Self-Care Deficit  Goal: Improved Ability to Complete Activities of Daily Living  Outcome: Progressing

## 2020-01-31 NOTE — Unmapped (Signed)
[place] TRAUMA, ACUTE CARE, and GENERAL SURGERY   - Surgery Daily Progress Note -  01/31/2020       Admit Date: 01/18/2020, Hospital Day: 14  Hospital Service: Peter Garter [facility])      Assessment     Kenneth Fitzgerald [facility] 45 y.o. 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 He underwent primary open ventral hernia repair with Dr. 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 Carlynn Purl on 01/24/20  - Diet: Advanced to regular diet. Added CC  - PICC paced 1/12 and TPN started 1/13. Renew daily  - Fluids: mIVF and TPN - TFO 125 in place   - Zofran as needed for nausea   - Replete lytes prn: IV K+, calcium for hypokalemia, hypocalcemia today   - Bowel regimen: on hold while AROBF  - continue provena wound vac until POD5 (1/15) - removed 1/15    *Crohn's disease  -  Hold home sulfasalazine while awaiting return of bowel function    GU: AKI on admission Cr peaked at 1.34 likely due to nausea, vomiting and poor PO intake in the setting of SBO (resolved)  - Adequate UOP.   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 Will transition back to home regimen 1/18 if tolerating diet.     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   - Dexamethasone discontinued 1/10 following taper  - [facility] at discharge for final recs and follow up needs    ID:  - Afebrile.  - WBC- WNL    PPx:   - Heparin     Dispo: Floor status  PT/OT: not indicated  Barriers to discharge: AROBF, diet advancement  CM/SW assisting in discharge planning.     Contact: [facility]  Kenneth Fitzgerald  [phone]      Subjective:   NAEON. No nausea or vomiting. Continues to pass gas and had BM.  Tolerated clears yesterday.     Objective:     Vital signs in last 24 hours:  Temp:  [35.9 ??C (96.6 ??F)-37.3 ??C (99.1 ??F)] 35.9 ??C (96.6 ??F)  Heart Rate:  [74-85] 74  Resp:  [16-18] 18  BP: (128-139)/(78-86) 128/82  MAP (mmHg):  [99-108] 99  SpO2:  [  97 %-98 %] 97 %    Intake/Output last 24 hours:  I/O last 3 completed shifts:  In: 290 [P.O.:290]  Out: 435 [Urine:300; Drains:135]    Physical Exam:  -General:  Appropriate, comfortable and in no apparent distress.   -Neurological: Alert and oriented x3. Moves all 4 extremities spontaneously.   -Pulmonary: Normal work of breathing. No accessory muscle use.  -Abdomen: Soft, slight distended, tender to palpation midline at incision. Incision c/d/i with staples.  Drain to LUQ with SS output.  -Extremities: Warm, well perfused, normal skin turgor.    Data Review:    All lab results last 24 hours:    Recent Results (from the past 24 hour(s))   POCT Glucose    Collection Time: 01/30/20 12:31 PM   Result Value Ref Range    Glucose, POC 132 70 - 179 mg/dL   POCT Glucose    Collection Time: 01/30/20  4:50 PM   Result Value Ref Range    Glucose, POC 96 70 - 179 mg/dL   Ionized Calcium, Venous    Collection Time: 01/30/20  8:01 PM   Result Value Ref Range    Calcium, Ionized Venous 3.02 (LL) 4.40 - 5.40 mg/dL   POCT Glucose    Collection Time: 01/31/20 12:35 AM   Result Value Ref Range    Glucose, POC 93 70 - 179 mg/dL   Magnesium Level    Collection Time: 01/31/20  4:05 AM   Result Value Ref Range    Magnesium 1.8 1.6 - 2.6 mg/dL   Phosphorus Level    Collection Time: 01/31/20  4:05 AM   Result Value Ref Range    Phosphorus 5.2 (H) 2.4 - 5.1 mg/dL   Basic Metabolic Panel    Collection Time: 01/31/20  4:05 AM   Result Value Ref Range    Sodium 139 135 - 145 mmol/L    Potassium 3.6 3.4 - 4.5 mmol/L    Chloride 107 98 - 107 mmol/L    CO2 22.0 20.0 - 31.0 mmol/L    Anion Gap 10 5 - 14 mmol/L    BUN 11 9 - 23 mg/dL    Creatinine 2.84 1.32 - 1.10 mg/dL    BUN/Creatinine Ratio 14     EGFR CKD-EPI Non-African American, Male >90 >=60 mL/min/1.62m2    EGFR CKD-EPI African American, Male >90 >=60 mL/min/1.61m2    Glucose 92 70 - 179 mg/dL    Calcium 5.8 (LL) 8.7 - 10.4 mg/dL   Ionized Calcium, Venous    Collection Time: 01/31/20  4:05 AM   Result Value Ref Range    Calcium, Ionized Venous 3.13 (LL) 4.40 - 5.40 mg/dL   CBC w/ Differential    Collection Time: 01/31/20  4:05 AM   Result Value Ref Range    WBC 5.6 4.5 - 11.0 10*9/L    RBC 3.58 (L) 4.50 - 5.90 10*12/L    HGB 9.4 (L) 13.5 - 17.5 g/dL    HCT 44.0 (L) 10.2 - 53.0 %    MCV 82.2 80.0 - 100.0 fL    MCH 26.2 26.0 - 34.0 pg    MCHC 31.8 31.0 - 37.0 g/dL    RDW 72.5 (H) 36.6 - 15.0 %    MPV 9.7 7.0 - 10.0 fL    Platelet 223 150 - 440 10*9/L    Neutrophils % 73.7 %    Lymphocytes % 13.9 %    Monocytes % 4.7 %    Eosinophils % 5.7 %  Basophils % 0.4 %    Absolute Neutrophils 4.2 2.0 - 7.5 10*9/L    Absolute Lymphocytes 0.8 (L) 1.5 - 5.0 10*9/L    Absolute Monocytes 0.3 0.2 - 0.8 10*9/L    Absolute Eosinophils 0.3 0.0 - 0.4 10*9/L    Absolute Basophils 0.0 0.0 - 0.1 10*9/L    Large Unstained Cells 2 0 - 4 %    Microcytosis Slight (A) Not Present Anisocytosis Moderate (A) Not Present    Hypochromasia Marked (A) Not Present   POCT Glucose    Collection Time: 01/31/20  6:21 AM   Result Value Ref Range    Glucose, POC 96 70 - 179 mg/dL       Imaging: none    Attending Attestation  I saw and evaluated the patient, participating in the key portions of the service. I reviewed the resident???s note. I agree with the resident???s findings and plan.  Celso Amy MD

## 2020-01-31 NOTE — Unmapped (Signed)
Problem: Pain Acute  Goal: Acceptable Pain Control and Functional Ability  Outcome: Progressing     Problem: VTE (Venous Thromboembolism)  Goal: VTE (Venous Thromboembolism) Symptom Resolution  Outcome: Progressing  Intervention: Prevent or Manage VTE (Venous Thromboembolism)  Recent Flowsheet Documentation  Taken 01/31/2020 0753 by Lajean Manes, RN  VTE Prevention/Management:   anticoagulant therapy   ambulation promoted     Problem: Self-Care Deficit  Goal: Improved Ability to Complete Activities of Daily Living  Outcome: Progressing

## 2020-01-31 NOTE — Unmapped (Signed)
Problem: Pain Acute  Goal: Acceptable Pain Control and Functional Ability  01/31/2020 1301 by Lajean Manes, RN  Outcome: Progressing  01/31/2020 0758 by Lajean Manes, RN  Outcome: Progressing     Problem: VTE (Venous Thromboembolism)  Goal: VTE (Venous Thromboembolism) Symptom Resolution  01/31/2020 1301 by Lajean Manes, RN  Outcome: Progressing  01/31/2020 0758 by Lajean Manes, RN  Outcome: Progressing  Intervention: Prevent or Manage VTE (Venous Thromboembolism)  Recent Flowsheet Documentation  Taken 01/31/2020 0753 by Lajean Manes, RN  VTE Prevention/Management:   anticoagulant therapy   ambulation promoted     Problem: Self-Care Deficit  Goal: Improved Ability to Complete Activities of Daily Living  01/31/2020 1301 by Lajean Manes, RN  Outcome: Progressing  01/31/2020 0758 by Lajean Manes, RN  Outcome: Progressing

## 2020-01-31 NOTE — Unmapped (Signed)
Problem: Pain Acute  Goal: Acceptable Pain Control and Functional Ability  Outcome: Progressing     Problem: VTE (Venous Thromboembolism)  Goal: VTE (Venous Thromboembolism) Symptom Resolution  Outcome: Progressing  Intervention: Prevent or Manage VTE (Venous Thromboembolism)  Recent Flowsheet Documentation  Taken 01/30/2020 0800 by Lajean Manes, RN  VTE Prevention/Management:   anticoagulant therapy   ambulation promoted     Problem: Self-Care Deficit  Goal: Improved Ability to Complete Activities of Daily Living  Outcome: Progressing

## 2020-01-31 NOTE — Unmapped (Signed)
Ambulating in hallways. Voiding. Denies pain at this time.tolerating sips of fluids. Denies nausea and vomiting. No acute events overnight. All safety measures in place.  Will continue to monitor.         Problem: Pain Acute  Goal: Acceptable Pain Control and Functional Ability  Outcome: Progressing     Problem: VTE (Venous Thromboembolism)  Goal: VTE (Venous Thromboembolism) Symptom Resolution  Outcome: Progressing     Problem: Self-Care Deficit  Goal: Improved Ability to Complete Activities of Daily Living  Outcome: Progressing

## 2020-02-01 LAB — CBC W/ AUTO DIFF
BASOPHILS ABSOLUTE COUNT: 0 10*9/L (ref 0.0–0.1)
BASOPHILS RELATIVE PERCENT: 0.6 %
EOSINOPHILS ABSOLUTE COUNT: 0.3 10*9/L (ref 0.0–0.4)
EOSINOPHILS RELATIVE PERCENT: 4.6 %
HEMATOCRIT: 31.6 % — ABNORMAL LOW (ref 41.0–53.0)
HEMOGLOBIN: 9.6 g/dL — ABNORMAL LOW (ref 13.5–17.5)
LARGE UNSTAINED CELLS: 1 % (ref 0–4)
LYMPHOCYTES ABSOLUTE COUNT: 0.8 10*9/L — ABNORMAL LOW (ref 1.5–5.0)
LYMPHOCYTES RELATIVE PERCENT: 11.3 %
MEAN CORPUSCULAR HEMOGLOBIN CONC: 30.3 g/dL — ABNORMAL LOW (ref 31.0–37.0)
MEAN CORPUSCULAR HEMOGLOBIN: 25.2 pg — ABNORMAL LOW (ref 26.0–34.0)
MEAN CORPUSCULAR VOLUME: 83.2 fL (ref 80.0–100.0)
MEAN PLATELET VOLUME: 10.2 fL — ABNORMAL HIGH (ref 7.0–10.0)
MONOCYTES ABSOLUTE COUNT: 0.3 10*9/L (ref 0.2–0.8)
MONOCYTES RELATIVE PERCENT: 5 %
NEUTROPHILS ABSOLUTE COUNT: 5.1 10*9/L (ref 2.0–7.5)
NEUTROPHILS RELATIVE PERCENT: 77.3 %
PLATELET COUNT: 264 10*9/L (ref 150–440)
RED BLOOD CELL COUNT: 3.8 10*12/L — ABNORMAL LOW (ref 4.50–5.90)
RED CELL DISTRIBUTION WIDTH: 19.1 % — ABNORMAL HIGH (ref 12.0–15.0)
WBC ADJUSTED: 6.6 10*9/L (ref 4.5–11.0)

## 2020-02-01 LAB — BASIC METABOLIC PANEL
ANION GAP: 11 mmol/L (ref 5–14)
BLOOD UREA NITROGEN: 13 mg/dL (ref 9–23)
BUN / CREAT RATIO: 16
CALCIUM: 6.3 mg/dL — ABNORMAL LOW (ref 8.7–10.4)
CHLORIDE: 107 mmol/L (ref 98–107)
CO2: 21 mmol/L (ref 20.0–31.0)
CREATININE: 0.81 mg/dL
EGFR CKD-EPI AA MALE: >90 mL/min/{1.73_m2} (ref >=60)
EGFR CKD-EPI NON-AA MALE: >90 mL/min/{1.73_m2} (ref >=60)
GLUCOSE RANDOM: 105 mg/dL (ref 70–179)
POTASSIUM: 3.7 mmol/L (ref 3.4–4.5)
SODIUM: 139 mmol/L (ref 135–145)

## 2020-02-01 LAB — PHOSPHORUS: PHOSPHORUS: 4.9 mg/dL (ref 2.4–5.1)

## 2020-02-01 LAB — MAGNESIUM: MAGNESIUM: 1.8 mg/dL (ref 1.6–2.6)

## 2020-02-01 MED ADMIN — enoxaparin (LOVENOX) syringe 40 mg: 40 mg | SUBCUTANEOUS | @ 02:00:00 | Stop: 2020-02-02

## 2020-02-01 MED ADMIN — sodium chloride (NS) 0.9 % flush 10 mL: 10 mL | INTRAVENOUS | @ 09:00:00 | Stop: 2020-02-02

## 2020-02-01 MED ADMIN — sodium chloride (NS) 0.9 % flush 10 mL: 10 mL | INTRAVENOUS | @ 23:00:00 | Stop: 2020-02-02

## 2020-02-01 MED ADMIN — levothyroxine (SYNTHROID) tablet 375 mcg: 375 ug | ORAL | @ 13:00:00 | Stop: 2020-02-02

## 2020-02-01 MED ADMIN — white petrolatum (AQUAPHOR) 41 % ointment 1 application: 1 | TOPICAL | @ 13:00:00 | Stop: 2020-02-02

## 2020-02-01 MED ADMIN — DULoxetine (CYMBALTA) DR capsule 30 mg: 30 mg | ORAL | @ 02:00:00 | Stop: 2020-02-02

## 2020-02-01 MED ADMIN — sodium chloride (NS) 0.9 % flush 10 mL: 10 mL | INTRAVENOUS | @ 16:00:00 | Stop: 2020-02-02

## 2020-02-01 MED ADMIN — pantoprazole (PROTONIX) injection 40 mg: 40 mg | INTRAVENOUS | @ 12:00:00 | Stop: 2020-02-02

## 2020-02-01 MED ADMIN — acetaminophen (TYLENOL) tablet 650 mg: 650 mg | ORAL | @ 12:00:00 | Stop: 2020-02-02

## 2020-02-01 MED ADMIN — memantine (NAMENDA) tablet 10 mg: 10 mg | ORAL | @ 15:00:00 | Stop: 2020-02-02

## 2020-02-01 MED ADMIN — acetaminophen (TYLENOL) tablet 650 mg: 650 mg | ORAL | @ 23:00:00 | Stop: 2020-02-02

## 2020-02-01 MED ADMIN — pantoprazole (PROTONIX) injection 40 mg: 40 mg | INTRAVENOUS | @ 19:00:00 | Stop: 2020-02-02

## 2020-02-01 MED ADMIN — calcium gluconate 2 g in sodium chloride (NS) 0.9 % 250 mL IVPB: 2 g | INTRAVENOUS | @ 16:00:00 | Stop: 2020-02-01

## 2020-02-01 MED ADMIN — DULoxetine (CYMBALTA) DR capsule 30 mg: 30 mg | ORAL | @ 13:00:00 | Stop: 2020-02-02

## 2020-02-01 MED ADMIN — enoxaparin (LOVENOX) syringe 40 mg: 40 mg | SUBCUTANEOUS | @ 13:00:00 | Stop: 2020-02-02

## 2020-02-01 MED ADMIN — oxyCODONE (ROXICODONE) immediate release tablet 10 mg: 10 mg | ORAL | @ 05:00:00 | Stop: 2020-02-02

## 2020-02-01 MED ADMIN — Adult 3-in-1 PN: INTRAVENOUS | @ 05:00:00 | Stop: 2020-02-01

## 2020-02-01 MED ADMIN — acetaminophen (TYLENOL) tablet 650 mg: 650 mg | ORAL | @ 19:00:00 | Stop: 2020-02-02

## 2020-02-01 NOTE — Unmapped (Signed)
No acute events this shift. VSS, patient on room air.   Patient diet.  TPN infusing.  Patient voiding adequately, no BM this shift.  Pain managed with prn oxy x1 this shift.  All safety precautions in place, will continue to monitor.                  Problem: Pain Acute  Goal: Acceptable Pain Control and Functional Ability  Outcome: Progressing     Problem: VTE (Venous Thromboembolism)  Goal: VTE (Venous Thromboembolism) Symptom Resolution  Outcome: Progressing     Problem: Self-Care Deficit  Goal: Improved Ability to Complete Activities of Daily Living  Outcome: Progressing

## 2020-02-01 NOTE — Unmapped (Signed)
TRAUMA, ACUTE CARE, and GENERAL SURGERY   - Surgery Daily Progress Note -  02/01/2020       Admit Date: 01/18/2020, Hospital Day: 15  Hospital Service: [facility])      Assessment     Kenneth Fitzgerald 45 y.o. 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 He underwent primary open ventral hernia repair with Dr. 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 Carlynn Purl on 01/24/20  - Diet: Tolerating regular diet with good apetite  - PICC paced 1/12 and TPN started 1/13. Will let expire tonight  - Fluids: Medlock   - Zofran as needed for nausea   - Replete lytes prn: Calcium gluconate for hypocalcemia  - Bowel regimen: on hold for loose BMs    *Crohn's disease  -  Hold home sulfasalazine, may restart upon discharge    GU: AKI on admission Cr peaked at 1.34 likely due to nausea, vomiting and poor PO intake in the setting of SBO (resolved)  - Adequate UOP.   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   - Dexamethasone discontinued 1/10 following taper    ID:  - Afebrile.  - WBC- WNL    PPx:   - Heparin     Dispo: Floor status  PT/OT: not indicated  Barriers to discharge: TPN to expire tonight  CM/SW assisting in discharge planning.     Contact: [facility]  Tamsen Snider  [phone]      Subjective:   NAEON. No nausea or vomiting. Continues to pass gas and had BM.  Tolerated RD yesterday.     Objective:     Vital signs in last 24 hours:  Temp:  [35.6 ??C (96.1 ??F)-36.8 ??C (98.2 ??F)] 36.3 ??C (97.3 ??F)  Heart Rate:  [82-90] 82  Resp:  [16-18] 17  BP: (131-153)/(69-89) 153/86  MAP (mmHg):  [98-111] 111  SpO2:  [94 %-98 %] 98 %    Intake/Output last 24 hours:  I/O last 3 completed shifts:  In: 1640 [P.O.:1640]  Out: 1325 [Urine:1050; Drains:275]    Physical Exam:  -General:  Appropriate, comfortable and in no apparent distress.   -Neurological: Alert and oriented x3. Moves all 4 extremities spontaneously.   -  Pulmonary: Normal work of breathing. No accessory muscle use.  -Abdomen: Soft, non-distended, tender to palpation midline at incision. Incision c/d/i with staples.  Drain to LUQ with SS output.  -Extremities: Warm, well perfused, normal skin turgor.    Data Review:    All lab results last 24 hours:    Recent Results (from the past 24 hour(s))   POCT Glucose    Collection Time: 01/31/20 12:45 PM   Result Value Ref Range    Glucose, POC 95 70 - 179 mg/dL   POCT Glucose    Collection Time: 01/31/20  4:55 PM   Result Value Ref Range    Glucose, POC 108 70 - 179 mg/dL   Basic Metabolic Panel    Collection Time: 01/31/20  7:03 PM   Result Value Ref Range    Sodium 130 (L) 135 - 145 mmol/L    Potassium 7.1 (HH) 3.4 - 4.5 mmol/L    Chloride 103 98 - 107 mmol/L    CO2 20.0 20.0 - 31.0 mmol/L    Anion Gap 7 5 - 14 mmol/L    BUN 13 9 - 23 mg/dL    Creatinine 1.61 0.96 - 1.10 mg/dL    BUN/Creatinine Ratio 16     EGFR CKD-EPI Non-African American, Male >90 >=60 mL/min/1.63m2    EGFR CKD-EPI African American, Male >90 >=60 mL/min/1.68m2    Glucose 474 (HH) 70 - 179 mg/dL    Calcium 6.0 (L) 8.7 - 10.4 mg/dL   Basic Metabolic Panel    Collection Time: 01/31/20  9:02 PM   Result Value Ref Range    Sodium 138 135 - 145 mmol/L    Potassium 4.0 3.4 - 4.5 mmol/L    Chloride 107 98 - 107 mmol/L    CO2 22.0 20.0 - 31.0 mmol/L    Anion Gap 9 5 - 14 mmol/L    BUN 14 9 - 23 mg/dL    Creatinine 0.45 4.09 - 1.10 mg/dL    BUN/Creatinine Ratio 18     EGFR CKD-EPI Non-African American, Male >90 >=60 mL/min/1.47m2    EGFR CKD-EPI African American, Male >90 >=60 mL/min/1.8m2    Glucose 94 70 - 179 mg/dL    Calcium 6.4 (L) 8.7 - 10.4 mg/dL   POCT Glucose    Collection Time: 01/31/20 11:55 PM   Result Value Ref Range    Glucose, POC 103 70 - 179 mg/dL   Magnesium Level    Collection Time: 02/01/20  4:49 AM   Result Value Ref Range    Magnesium 1.8 1.6 - 2.6 mg/dL   Phosphorus Level    Collection Time: 02/01/20  4:49 AM   Result Value Ref Range    Phosphorus 4.9 2.4 - 5.1 mg/dL   Basic Metabolic Panel    Collection Time: 02/01/20  4:49 AM   Result Value Ref Range    Sodium 139 135 - 145 mmol/L    Potassium 3.7 3.4 - 4.5 mmol/L    Chloride 107 98 - 107 mmol/L    CO2 21.0 20.0 - 31.0 mmol/L    Anion Gap 11 5 - 14 mmol/L    BUN 13 9 - 23 mg/dL    Creatinine 8.11 9.14 - 1.10 mg/dL    BUN/Creatinine Ratio 16     EGFR CKD-EPI Non-African American, Male >90 >=60 mL/min/1.25m2    EGFR CKD-EPI African American, Male >90 >=60 mL/min/1.83m2    Glucose 105 70 - 179 mg/dL    Calcium 6.3 (L) 8.7 -  10.4 mg/dL   CBC w/ Differential    Collection Time: 02/01/20  4:49 AM   Result Value Ref Range    WBC 6.6 4.5 - 11.0 10*9/L    RBC 3.80 (L) 4.50 - 5.90 10*12/L    HGB 9.6 (L) 13.5 - 17.5 g/dL    HCT 01.0 (L) 27.2 - 53.0 %    MCV 83.2 80.0 - 100.0 fL    MCH 25.2 (L) 26.0 - 34.0 pg    MCHC 30.3 (L) 31.0 - 37.0 g/dL    RDW 53.6 (H) 64.4 - 15.0 %    MPV 10.2 (H) 7.0 - 10.0 fL    Platelet 264 150 - 440 10*9/L    Neutrophils % 77.3 %    Lymphocytes % 11.3 %    Monocytes % 5.0 %    Eosinophils % 4.6 %    Basophils % 0.6 %    Absolute Neutrophils 5.1 2.0 - 7.5 10*9/L    Absolute Lymphocytes 0.8 (L) 1.5 - 5.0 10*9/L    Absolute Monocytes 0.3 0.2 - 0.8 10*9/L    Absolute Eosinophils 0.3 0.0 - 0.4 10*9/L    Absolute Basophils 0.0 0.0 - 0.1 10*9/L    Large Unstained Cells 1 0 - 4 %    Microcytosis Slight (A) Not Present    Anisocytosis Moderate (A) Not Present    Hypochromasia Marked (A) Not Present   POCT Glucose    Collection Time: 02/01/20  6:52 AM   Result Value Ref Range    Glucose, POC 108 70 - 179 mg/dL       Imaging: none    Attending Attestation  I saw and evaluated the patient, participating in the key portions of the service. I reviewed the resident???s note. I agree with the resident???s findings and plan.  Celso Amy MD

## 2020-02-01 NOTE — Unmapped (Signed)
No acute events this shift. VSS, patient on room air.   Patient tolerating diet.  Patient voididng, no bm this shift.  Pain managed with prn oxy x1 this shift.  All safety precautions in place, will continue to monitor.              Problem: Pain Acute  Goal: Acceptable Pain Control and Functional Ability  02/01/2020 0731 by Livia Snellen, RN  Outcome: Progressing  02/01/2020 0618 by Livia Snellen, RN  Outcome: Progressing     Problem: VTE (Venous Thromboembolism)  Goal: VTE (Venous Thromboembolism) Symptom Resolution  02/01/2020 0731 by Livia Snellen, RN  Outcome: Progressing  02/01/2020 0618 by Livia Snellen, RN  Outcome: Progressing     Problem: Self-Care Deficit  Goal: Improved Ability to Complete Activities of Daily Living  02/01/2020 0731 by Livia Snellen, RN  Outcome: Progressing  02/01/2020 0618 by Livia Snellen, RN  Outcome: Progressing

## 2020-02-02 DIAGNOSIS — C439 Malignant melanoma of skin, unspecified: Principal | ICD-10-CM

## 2020-02-02 MED ORDER — OXYCODONE 5 MG TABLET
ORAL_TABLET | Freq: Four times a day (QID) | ORAL | 0 refills | 5 days | Status: CP | PRN
Start: 2020-02-02 — End: 2020-02-07
  Filled 2020-02-02: qty 20, 5d supply, fill #0

## 2020-02-02 MED ADMIN — white petrolatum (AQUAPHOR) 41 % ointment 1 application: 1 | TOPICAL | @ 15:00:00 | Stop: 2020-02-02

## 2020-02-02 MED ADMIN — pantoprazole (PROTONIX) injection 40 mg: 40 mg | INTRAVENOUS | @ 11:00:00 | Stop: 2020-02-02

## 2020-02-02 MED ADMIN — acetaminophen (TYLENOL) tablet 650 mg: 650 mg | ORAL | @ 05:00:00 | Stop: 2020-02-02

## 2020-02-02 MED ADMIN — acetaminophen (TYLENOL) tablet 650 mg: 650 mg | ORAL | @ 18:00:00 | Stop: 2020-02-02

## 2020-02-02 MED ADMIN — sodium chloride (NS) 0.9 % flush 10 mL: 10 mL | INTRAVENOUS | @ 15:00:00 | Stop: 2020-02-02

## 2020-02-02 MED ADMIN — enoxaparin (LOVENOX) syringe 40 mg: 40 mg | SUBCUTANEOUS | @ 15:00:00 | Stop: 2020-02-02

## 2020-02-02 MED ADMIN — DULoxetine (CYMBALTA) DR capsule 30 mg: 30 mg | ORAL | @ 15:00:00 | Stop: 2020-02-02

## 2020-02-02 MED ADMIN — acetaminophen (TYLENOL) tablet 650 mg: 650 mg | ORAL | @ 11:00:00 | Stop: 2020-02-02

## 2020-02-02 MED ADMIN — memantine (NAMENDA) tablet 10 mg: 10 mg | ORAL | @ 15:00:00 | Stop: 2020-02-02

## 2020-02-02 MED ADMIN — calcium gluconate 2 g in sodium chloride (NS) 0.9 % 250 mL IVPB: 2 g | INTRAVENOUS | @ 15:00:00 | Stop: 2020-02-02

## 2020-02-02 MED ADMIN — levothyroxine (SYNTHROID) tablet 375 mcg: 375 ug | ORAL | @ 11:00:00 | Stop: 2020-02-02

## 2020-02-02 MED ADMIN — sodium chloride (NS) 0.9 % flush 10 mL: 10 mL | INTRAVENOUS | @ 08:00:00 | Stop: 2020-02-02

## 2020-02-02 MED ADMIN — oxyCODONE (ROXICODONE) immediate release tablet 10 mg: 10 mg | ORAL | @ 05:00:00 | Stop: 2020-02-02

## 2020-02-02 MED ADMIN — enoxaparin (LOVENOX) syringe 40 mg: 40 mg | SUBCUTANEOUS | @ 02:00:00 | Stop: 2020-02-02

## 2020-02-02 MED ADMIN — DULoxetine (CYMBALTA) DR capsule 30 mg: 30 mg | ORAL | @ 02:00:00 | Stop: 2020-02-02

## 2020-02-02 NOTE — Unmapped (Signed)
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) POA: Yes    Postoperative hypothyroidism POA: Yes    Malignant melanoma, metastatic (CMS-HCC) POA: Unknown    Crohn's disease of large intestine without complication (CMS-HCC) POA: Yes    Malignant neoplasm of brain, unspecified location (CMS-HCC) POA: Yes    Gout POA: Yes    Morbid obesity with BMI of 40.0-44.9, adult (CMS-HCC) POA: Not Applicable  Resolved Problems:    Small bowel obstruction (CMS-HCC) POA: Yes      OR Procedures:    EXPLORATORY LAPAROTOMY, EXPLORATORY CELIOTOMY WITH OR WITHOUT BIOPSY(S)  ENTEROLYSIS (SEPART PROC)  REPAIR RECURRENT INCISIONAL OR VENTRAL HERNIA; INCARCERATED OR STRANGULATED  NEG PRESS WOUND TX (VAC ASSIST) INCL TOPICALS, PER SESSION, TSA GREATER THAN/= 50 CM SQUARED  Date  01/24/2020  -------------------     Ancillary Procedures: no procedures    Discharge Day Services: The patient was seen and examined by the Trauma Surgery team on the day of discharge. Vital signs and laboratory values were stable and within normal limits. Plan to discharge with follow-up in 1 week for staple removal and drain assessment. Okay to resume all home medications. Discharge plan was discussed, instructions were given and all questions answered.      Subjective   No acute events overnight. Pain Controlled. No fever or chills.    Objective   Patient Vitals for the past 8 hrs:   BP Temp Temp src Pulse Resp SpO2   02/02/20 0900 113/59 36.8 ??C (98.2 ??F) Oral 76 18 94 %   02/02/20 0616 116/58 36.1 ??C (97 ??F) Oral 81 19 96 %     No intake/output data recorded.      Hospital Course:  Kenneth Fitzgerald [facility] 45 y.o. 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      # AKI on admission: likely due to nausea, poor PO intake and vomiting.  Creatinine on admission 1.34.  MIVF administered.  At discharge, creatinine was WNL.      # Metastatic melanoma with carcinomatosis and liver involvement, recurrent thyroid cancer s/p RT to neck 2015: oncology consulted and recommended to continue dexamethaxone and home dabrafenib/trametinib.  Radiation oncology was also consulted to treat while inpatient if necessary.   Pt will discharge and continue regular treatment as outpatient.  Discussed with rad onc; prednisone taper completed.    # SBO: NGT placed and clamp trial performed 1/6 removed on 1/6, pt initially tolerated well however had recurrence of nausea and vomiting requiring re-insertion on 1/8.  Repeat CT imaging showed closed loop bowel obstruction.  Patient was taken to the OR  on 1/10 for an Open primary ventral hernia repair with LOA of 90 minutes.  with Dr. Carlynn Purl. Operative findings: Melanoma implants within peritoneum and mesentery.  Chronically dilated SB from jejunum to proximal ileum.  Incarcerated small bowel appeared viable.  Retained mesh at the lateral abdomen without any appreciable mesh in the midline. JP drain placed in the subcutaneous tissue, which will be left in place with tentative plan to remove in clinic in 1 week.     His diet was advanced and by discharge he was tolerating a regular diet. He was voiding adequately, ambulating independently, and his pain was controlled wth PO pain medications. He was examined by the Trauma Surgery team on the day of discharge and was deemed suitable for discharge home. He will be discharged on HD #3 in good condition.    I spent greater than 30 minutes completing this discharge.       Condition at Discharge: Improved  Discharge Medications:      Medication List      START taking these medications     acetaminophen 500 MG tablet; Commonly known as: TYLENOL; Take 2 tablets   (1,000 mg total) by mouth every eight (8) hours as needed for pain.   oxyCODONE 5 MG immediate release tablet; Commonly known as: ROXICODONE;   Take 1 tablet (5 mg total) by mouth every six (6) hours as needed for up   to 5 days.     CONTINUE taking these medications     ALLEGRA ALLERGY 180 MG tablet; Generic drug: fexofenadine   blood sugar diagnostic Strp; Dispense 100 blood glucose test strips, ok   to sub any brand preferred by insurance/patient, use 3x/day; dispense   whatever brand matches with meter.   blood-glucose meter kit; Use as instructed; dispense 1 meter, whatever   is preferred by insurance   BRAFTOVI 75 mg capsule; Generic drug: encorafenib; Take 6 capsules (450   mg total) by mouth daily.   calcitrioL 0.25 MCG capsule; Commonly known as: ROCALTROL; Take 1   capsule (0.25 mcg total) by mouth daily.   calcium carbonate 650 mg calcium (1,625 mg) tablet   DULoxetine 30 MG capsule; Commonly known as: CYMBALTA; Take 1 capsule   (30 mg total) by mouth Two (2) times a day.   folic acid 1 MG tablet; Commonly known as: FOLVITE; Take 1 tablet (1 mg   total) by mouth daily.   ibuprofen 200 MG tablet; Commonly known as: ADVIL,MOTRIN   lancets Misc; Dispense 100 lancets, ok to sub any brand preferred by   insurance/patient, use 3x/day   [name] 15 mg tablet; Generic drug: binimetinib; Take 3 tablets (45 mg   total) by mouth Two (2) times a day.   melatonin 3 mg Tab; Take 1 tablet (3 mg total) by mouth every evening.   memantine 10 MG tablet; Commonly known as: NAMENDA metFORMIN 500 MG tablet; Commonly known as: GLUCOPHAGE; Take 1 tablet   (500 mg total) by mouth 2 (two) times a day with meals.   ondansetron 8 MG tablet; Commonly known as: ZOFRAN; Take 1 tablet (8 mg   total) by mouth every eight (8) hours as needed for nausea.   sulfaSALAzine 500 mg tablet; Commonly known as: AZULFIDINE; Take 2   tablets (1000mg ) by mouth twice daily for 7 days, then take 4 tablets   (2000mg ) twice daily thereafter.   SYNTHROID 125 MCG tablet; Generic drug: levothyroxine     STOP taking these medications     dexAMETHasone 2 MG tablet; Commonly known as: DECADRON       Pending  Test Results: None    Discharge Instructions:  Activity:   Activity Instructions       Activity as tolerated      Lifting restrictions (specify)      Weight restriction of 10 lbs.          Diet: Regular     Other Instructions:  Other Instructions       Call MD for:  persistent nausea or vomiting      Call MD for:  redness, tenderness, or signs of infection (pain, swelling, redness, odor or green/yellow discharge around incision site)      Call MD for:  severe uncontrolled pain      Call MD for:  temperature >38.5 Celsius (101.3 Fahrenheit)      Discharge instructions      Instructions    DIET: You may advance your diet to regular, as tolerated.   Can take OTC laxative or stool softener like colace or miralax as needed for any constipation.      INCISION: Look at your incisions at least twice a day. A small amount of drainage is normal, especially in the first couple of days after surgery. If you notice any redness around your incisions that spreads along your skin, or any thick, yellow drainage, you should call the clinic. Avoid wearing tight clothing.    ACTIVITY: You may shower after 24 hours, but do not soak in a tub for 2 to 3 weeks. No lifting greater than 10 pounds or strenuous activity for 2 weeks then nothing over 20 pounds from 2-6 weeks. If feeling pulling at incision back off.      PAIN MEDICATION: Do not drive or drink alcohol while taking prescription pain medication. Take your prescribed pain medication only as needed. You may decrease the amount of pain medication as tolerated, and take Tylenol or a Motrin product as directed on the label.  Pain medication can cause constipation. To avoid this, you should take an over-the-counter stool softener, such as docusate 100mg  1-2 times daily.     Effective July 13, 2015, the [place] signed a [address] that works to address the opioid crisis our state is facing.  It is called the STOP Law, for Strengthen Opioid Misuse Prevention.       As part of this law, we are limited to prescribing no more than a seven-day supply of opioid pain medicine for our surgery patients initally.  (This law also limits prescriptions for patients who have not had surgery to a five-day supply.)     Because opioid prescriptions cannot be renewed electronically or over the phone, a paper prescription must be presented to the pharmacy.  Therefore, a patient must contact our office at least 2-3 business days before you anticipate you will run out of pain medication so that we can develop a plan for you.  The best number to call is [phone].  The on-call provider will NOT be able to renew your prescription.     The STOP Act also monitors the amount of controlled substances that each patient has received from any and all providers.  Prescriptions for each patient will be monitored by the [place] Controlled Substances Reporting System in accordance with the law.  Thank you for working with [place] so we can provide good pain control in the safest way possible while you recover.     Please note that prescription pain medication refills will not be called into your pharmacy. If you feel that you are  needing more pain medication, you will need to be seen in clinic or the Emergency Department for further evaluation to ensure you are not experiencing a complication. Please take note of how many pills you have left in your bottle, and if you are starting to run low and feel that you will need more for adequate pain control, to call the clinic as soon as possible to schedule an appointment, as our clinics are very busy, and we cannot guarantee same week appointments.    The Trauma Surgery office number is [phone]. For emergencies at night or on the week-end, please call [phone]) and ask for the surgery resident on call.    WHEN TO CALL THE SURGEON'S OFFICE:  1. Signs and symptoms of strangulated hernia that would require immediate attention or emergency care include: nausea, vomiting, fever, severe pain, hernia bulge that turns red or purple, or inability to move bowels or pass gas.  Pt verbalized understanding.   2. Temperature greater than 101.5  3. Uncontrolled nausea or vomiting.  4. Pain that is not controlled by your pain medication    May take Motrin 600mg  with breakfast, lunch, dinner and before bed time to help with pain.     Standard Patient Notification Script  We care deeply about how our patients' pain is managed after surgery.  During the month after you have surgery, you may receive a call from [place] asking about your postsurgical pain and about how you managed that pain.  The survey will take approximately 5 to 10 minutes and will help [place] to provide better care to our patients.  Thank you in advance for your participation and please answer any calls coming from the [facility] or [place] area!    Please follow-up with your primary care physician (PCP) to discuss the following incidental findings noted on your imaging:  - Multiple peritoneal implants and prominent abdominal and pelvic lymph nodes, consistent with patient's known metastatic melanoma and better appreciated on prior MRI. Lymph nodes are less prominent than on prior imaging.  - Extensive colonic diverticulosis without evidence of diverticulitis  - Lytic lesion in the T12 vertebral body, unchanged from prior imaging. Sclerotic lesion in the L2 vertebral body is not well visualized on current imaging.   - Small left-sided fat-containing inguinal hernia.     From the oncology team:   - Resume dabrafenib/trametinib now that patient is tolerating PO   - Will arrange for follow-up with primary Oncologist    Follow Up  [facility] and Trauma Surgery  [address]  [facility] - First Floor  [place], [place]  [address]    Contact Information for Nurses:  [phone]: Waynetta Sandy    If need to change your appointment:  [phone]    Fax: [phone]  Follow-up in 1 week for staple removal and drain assessment. If having concerns please call the clinic to schedule an appointment.     Please note, if you arrive more than 20 minutes late, you will need to reschedule your appointment      *For support and information on substance abuse you may call the [facility]. It is free, confidential 24/7, 365 day-a-year treatment referral and information service (in [place] and [place]) for individuals and families facing mental and/or substance use disorders. 1-800-662-HELP (4357).     Keep schedule f/u with your Oncology team.          Labs and Other Follow-ups after Discharge:  Follow Up  instructions and Outpatient Referrals     Call MD for:  persistent nausea or vomiting      Call MD for:  redness, tenderness, or signs of infection (pain, swelling,   redness, odor or green/yellow discharge around incision site)      Call MD for:  severe uncontrolled pain      Call MD for:  temperature >38.5 Celsius (101.3 Fahrenheit)      Discharge instructions           Future Appointments:  Appointments which have been scheduled for you      Feb 10, 2020  UGI ENDOSCOPY; WITH BIOPSY, SINGLE OR MULTIPLE, SIGMOIDOSCOPY, FLEXIBLE; DIAGNOSTIC, WITH OR WITHOUT COLLECTION OF SPECIMEN(S) BY BRUSHING OR WASHING with Leland Her, MD  GI PERIOP [facility] REGION) [address]  [place] [address]  [phone] SIGMOIDOSCOPY, FLEXIBLE; DIAGNOSTIC, WITH OR WITHOUT COLLECTION OF  SPECIMEN(S) BY BRUSHING OR WASHING None     Apr 19, 2020  9:40 AM  (Arrive by 9:25 AM)  CT CHEST W CONTRAST with IC CT RM 1  RAD [facility] [address] [facility]) [address]  [place] [address]  [phone]   On appt date:  Drink lots of water 24 hrs  Bring recent lab work  Take meds as usual  [employer] fast streamer of current meds  Bring snack if diabetic    On appt date do not:  Consume anything 2 hrs prior to your appointment    Let us know if pt:  Allergic to contrast dyes  Diabetic  Pregnant or nursing  Claustrophobic    (Title:CTWCNTRST)       Apr 19, 2020 10:00 AM  (Arrive by 9:45 AM)  MRI Combo 135 w contrast -UN with IC MRI RM 2  IMG MRI IMAGING CENTER [facility]) [address]  [place] [address]  [phone]   On appt date:  Bring recent lab work  Bring documentation of any metal object implants  Take meds as usual  Check w/physician if diabetic  You will be asked to change into a gown for your safety    On appt date do not:  Consume anything 2 hrs  Wear metallic items including jewelry (we are not responsible for lost items)    Let us know if pt:  Claustrophobic  Metal object implant  Pregnant  Prescribed a sedative  On dialysis  Allergic to MRI dye/contrast  Kidney Failure    (Title:MRIWCNTRST)       Apr 19, 2020  1:00 PM  (Arrive by 12:30 PM)  RETURN ACTIVE [place] with [name], MD  [facility] ONCOLOGY MULTIDISCIPLINARY 2ND FLR CANCER HOSP [facility] REGION) [address]  [place] [address]  [phone]        Apr 19, 2020  1:30 PM  (Arrive by 1:00 PM)  RETURN PALLIATIVE CARE with Everlena Cooper, MD  [facility] ONCOLOGY MULTIDISCIPLINARY 2ND FLR CANCER HOSP [facility] REGION) [address]  [place] [address]  [phone]        May 03, 2020  2:00 PM  (Arrive by 1:45 PM)  RETURN  ENDOCRINE with Tripuraneni Jackquline Denmark, MD  [facility] DIABETES AND ENDOCRINOLOGY [place] [facility] REG[facility]) [address]  [place] [address]  [phone]        May 08, 2020 10:00 AM  (Arrive by 9:45 AM)  RETURN VIDEO - OTHER  with Luanne Bras, MD  [facility] GI MEDICINE [place] [facility]) [address]  [place] [address]  [phone]             ATTENDING ATTESTATION:    I saw and evaluated the patient, participating in key portions of the service on the day of discharge.  I reviewed the resident's note and agree with the discharge plans and disposition.  I personally spent less than 30 minutes in discharge planning service.   Celso Amy MD

## 2020-02-02 NOTE — Unmapped (Signed)
VSS, free of falls, tpn to discontinue at midnight, tolerating meals, will continue to monitor  Problem: Pain Acute  Goal: Acceptable Pain Control and Functional Ability  Outcome: Progressing     Problem: VTE (Venous Thromboembolism)  Goal: VTE (Venous Thromboembolism) Symptom Resolution  Outcome: Progressing  Intervention: Prevent or Manage VTE (Venous Thromboembolism)  Recent Flowsheet Documentation  Taken 02/01/2020 0800 by Lajean Manes, RN  VTE Prevention/Management:   ambulation promoted   anticoagulant therapy     Problem: Self-Care Deficit  Goal: Improved Ability to Complete Activities of Daily Living  Outcome: Progressing     Problem: Adult Inpatient Plan of Care  Goal: Absence of Hospital-Acquired Illness or Injury  Intervention: Identify and Manage Fall Risk  Recent Flowsheet Documentation  Taken 02/01/2020 1400 by Lajean Manes, RN  Safety Interventions: low bed  Taken 02/01/2020 1200 by Lajean Manes, RN  Safety Interventions: fall reduction program maintained  Taken 02/01/2020 0800 by Lajean Manes, RN  Safety Interventions: fall reduction program maintained  Intervention: Prevent and Manage VTE (Venous Thromboembolism) Risk  Recent Flowsheet Documentation  Taken 02/01/2020 0800 by Lajean Manes, RN  VTE Prevention/Management:   ambulation promoted   anticoagulant therapy

## 2020-02-02 NOTE — Unmapped (Signed)
Patient discharged. PICC removed. JP drain in place. Staples in place to abdomen with abdominal binder in use. Pt acknowledged understanding of discharge information, including new medications, when to seek medical attention, and follow up appointments. Pt transport called to take to outpatient pharmacy.      Problem: Pain Acute  Goal: Acceptable Pain Control and Functional Ability  Outcome: Resolved     Problem: VTE (Venous Thromboembolism)  Goal: VTE (Venous Thromboembolism) Symptom Resolution  Outcome: Resolved  Intervention: Prevent or Manage VTE (Venous Thromboembolism)  Recent Flowsheet Documentation  Taken 02/02/2020 1400 by Hinda Lenis, RN  Anti-Embolism Device Type: SCD, Knee  Anti-Embolism Intervention: Refused  Taken 02/02/2020 1200 by Hinda Lenis, RN  Anti-Embolism Device Type: SCD, Knee  Anti-Embolism Intervention: Refused  Taken 02/02/2020 1000 by Hinda Lenis, RN  Anti-Embolism Device Type: SCD, Knee  Anti-Embolism Intervention: Refused  Taken 02/02/2020 0800 by Hinda Lenis, RN  Anti-Embolism Device Type: SCD, Knee  Anti-Embolism Intervention: Refused     Problem: Self-Care Deficit  Goal: Improved Ability to Complete Activities of Daily Living  Outcome: Resolved

## 2020-02-02 NOTE — Unmapped (Signed)
I left message with patient Kenneth Fitzgerald [facility] to confirm appointments on the following date 2/16    [name]

## 2020-02-02 NOTE — Unmapped (Signed)
Called to check on patient in hospital post-op from ventral hernia surgery.  He is doing well.  Tolerating po and plans for going home today.  Reviewed path, noted that the abdominal wall/peritoneal implants contained melanophages, but no melanoma cells.  We discussed that this is interesting, good to know that there wasn't melanoma in the biopsy, is in keeping w/immune response.  That said, still need close monitoring.  Ok to resume BRAF/MEK targeted therapy.  Will arrange for follow up visit w/me in 4-6 weeks.  We can decide then if scans should be obtained sooner than scheduled in early April.  Pt expressed understanding.

## 2020-02-02 NOTE — Unmapped (Signed)
Oncology Consult Follow Up Note    Primary Attending Physician:  [name], MD  Primary Service: Peter Garter [facility])    Assessment: Kenneth Fitzgerald is a 45 y.o. 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 CT demonstrated closed loop bowel obstruction within mid abdominal ventral hernia. While peritoneal implants and abdominal/pelvic adenopathy visualized, lymph nodes less priominent than on prior imaging. After failed medical management, he was brought to OR for open repair (1/10). He is recovering well from surgery and now tolerating a regular diet.  ??  He has completed WBRT and dexamethasone taper for CNS disease this admission.   ??  Dabrafenib/trametinib have been held since admission on 01/18/2020.    Recommendations:   - Resume dabrafenib/trametinib at discharge  - We will arrange for follow up with primary oncologist    This patient was discussed with Dr. Philomena Course. These recommendations were relayed to the primary team by phone.    We will sign off. Please contact the Solid oncology consult fellow: [phone] with any further questions.    10:09 AM 02/02/2020   Esperanza Richters, MD  Hematology/Oncology Fellow    -------------------------------------------------------------    Interval history:    Patient is doing well today and excited to go home.  He has been tolerating full diet without nausea or vomiting.  Some mild residual post surgery abdominal pain.     Objective:   Vitals: Temp:  [35.9 ??C (96.6 ??F)-36.8 ??C (98.2 ??F)] 36.8 ??C (98.2 ??F)  Heart Rate:  [66-88] 76  Resp:  [18-19] 18  BP: (113-139)/(58-84) 113/59  MAP (mmHg):  [78-103] 80  SpO2:  [94 %-97 %] 94 %    Physical Exam:  Gen - well appearing, in no acute distress  Eyes - conjunctivae clear, PERRL  ENT - oral mucosa clear, dentition normal  Resp - symmetric excursion, normal work of breathing  CV - warm, well perfused, no cyanosis  GI - abdomen nondistended  Skin - no rashes, no lesions noted on visible skin  Neuro - AOx3, EOM intact, no facial droop, speech fluent and coherent, moves all extremities without asymmetry  Psych - affect normal, mood okay  MSK - no joint swelling or effusions noted    Test Results  Recent Labs     01/31/20  0405 02/01/20  0449   WBC 5.6 6.6   NEUTROABS 4.2 5.1   HGB 9.4* 9.6*   PLT 223 264       Current medication list reviewed.

## 2020-02-02 NOTE — Unmapped (Signed)
Ambulating in hallways. Voiding. fluids. Denies nausea and vomiting. No acute events overnight. All safety measures in place.  Will continue to monitor.         Problem: Pain Acute  Goal: Acceptable Pain Control and Functional Ability  Outcome: Progressing     Problem: VTE (Venous Thromboembolism)  Goal: VTE (Venous Thromboembolism) Symptom Resolution  Outcome: Progressing     Problem: Self-Care Deficit  Goal: Improved Ability to Complete Activities of Daily Living  Outcome: Progressing

## 2020-02-03 ENCOUNTER — Other Ambulatory Visit: Admit: 2020-02-03 | Discharge: 2020-02-04 | Payer: PRIVATE HEALTH INSURANCE

## 2020-02-03 DIAGNOSIS — C439 Malignant melanoma of skin, unspecified: Principal | ICD-10-CM

## 2020-02-04 NOTE — Unmapped (Signed)
RADIATION ONCOLOGY TREATMENT COMPLETION NOTE    Encounter Date: 02/04/2020  Patient Name: Kenneth Fitzgerald [facility]  Medical Record Number: [id number]    Referring Physician: No referring provider defined for this encounter.    Primary Care Provider: Ralene Cork, DO    DIAGNOSIS:  45 y.o. 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    RADIATION COURSE:  # 2 (received 4800 cGy to right neck in 2015)    TREATMENT INTENT: palliative    CLINICAL TRIAL: no    CHEMOTHERAPY: administered concurrently    RADIATION TREATMENT SUMMARY:          Treatment site Treatment Technique/Modality Energy Dose per fraction Total number  of fractions Total dose Start date End date   Whole brain 3D CRT 6 MV 300 cGy 10 3000 cGy 01/03/2020 01/20/2020     COMPLETED INTENDED COURSE:  yes    TREATMENT BREAK > 2 WEEKS:  no    TOLERANCE TO TREATMENT:  No toxicities or complications        PLAN FOR FOLLOW-UP: Donnal Debar is to return for follow up with me on a PRN basis. He will have ongoing follow up with medical oncology    Felipa Emory ANP      Bhishamjit S. Despina Hick, MD  Associate Professor  Associate Chair for Clinical Operations and Improvement  Director of Patient Safety and Quality  Department of Radiation Oncology  [facility]  [address], [id number]  [place], [place] [address]  O: [phone]  02/04/20 2:38 PM

## 2020-02-13 MED ORDER — DULOXETINE 30 MG CAPSULE,DELAYED RELEASE
ORAL_CAPSULE | Freq: Two times a day (BID) | ORAL | 3 refills | 0 days
Start: 2020-02-13 — End: ?

## 2020-02-14 ENCOUNTER — Encounter: Admit: 2020-02-14 | Discharge: 2020-02-15 | Payer: PRIVATE HEALTH INSURANCE | Attending: Clinical | Primary: Clinical

## 2020-02-14 DIAGNOSIS — F331 Major depressive disorder, recurrent, moderate: Principal | ICD-10-CM

## 2020-02-14 NOTE — Unmapped (Signed)
[facility]  Psychiatry  Psychotherapy Note - Telehealth via??Video  ??  Service Date: January 31,??2022  Service:??50??minutes of??psychotherapy via??video??session  Time with Patient:??50??minutes  ??  Encounter Description:??This encounter was conducted from provider's office via EPIC video session due to COVID-19 pandemic. Kenneth Fitzgerald was located in his home. Rationale for??video session??is need to social distance. See Plan for telemedicine consent/disclaimer.   ??  Encounter Description/Consent:??  Kenneth Fitzgerald visit was completed through telehealth encounter (video).   ????  This patient encounter is appropriate and reasonable under the circumstances. The patient has been advised of the potential risks and limitations of this mode of treatment (including, but not limited to, the absence of in-person examination) and has agreed to be treated in a remote fashion in spite of them. Any and all of the patient's/patient's family's questions on this issue have been answered.     The patient was physically located in [place] in which I am permitted to provide care. The patient??understood that??he may incur co-pays and cost sharing, and agreed to the telemedicine visit. The visit was??reasonable and appropriate under the circumstances given the patient's presentation at the time.   ????  Time Spent: 50??minutes  ??  Assessment:  Kenneth Fitzgerald a 44yo??male??with metastatic melanoma, h/o thyroid cancer, recent portal vein thrombosis, who I see in therapy to address depression and enhance coping with??multiple??stressors.????I checked-in with Kenneth Fitzgerald during his recent hospitalization and we met today for session via telehealth (video).????Kenneth Fitzgerald described being deconditioned and easily wiped out following much exertion. This has limited his ability to work and thus affected his finances.  As a result of these recent stressors - in addition to ongoing anxiety about his prognosis - he continues to experience depressive symptoms. No urgent or emergent concerns.  I offered psychotherapy, support,??and recommendations. I will also connect with him Kenneth Fitzgerald to explore financial resources.  Will see next in two weeks.  ??  Risk Assessment:  A suicide and violence risk assessment was performed as part of this evaluation. There patient is deemed to be at chronic elevated risk??for self-harm/suicide given the following factors: current diagnosis of depression, suicidal ideation or threats without a plan and past diagnosis of depression. There patient is deemed to be at chronic elevated risk??for violence given the following factors: male gender. These risk factors are mitigated by the following factors: lack of active SI/HI. There is no acute risk for suicide or violence at this time. The patient was educated about relevant modifiable risk factors including following recommendations for treatment of psychiatric illness and abstaining from substance abuse.??While future psychiatric events cannot be accurately predicted, the patient does not currently require acute inpatient psychiatric care and does not currently meet [facility] involuntary commitment criteria.??????????????????  ??  Plan:  1.??I will continue to see pt for psychotherapy. ??Next session??is??scheduled??to take place??in ~ two weeks  2. Pt??has??information on available [facility] resources.  I reached out today to Kenneth Fitzgerald to explore financial resources.  3.??Pt??followed by Dr. Lawernce Fitzgerald of Psychiatry.  4. Pt has??my contact information and knows??to reach me as needed.  ??  Subjective:??  Kenneth Fitzgerald presented as alert and oriented.????He was engaged and cooperative, and did not present as highly anxious or depressed during the session.  ??  I had checked-in with Kenneth Fitzgerald twice briefly during his recent hospitalization, and today was our first appt since he was discharged.  Kenneth Fitzgerald described being deconditioned and feeling wiped out when exerting much energy.  He noted the  last month - between radiation, SBO, hernia repair - has probably been the most physically challenging period of his entire cancer experience.  He has not been able to work much at all recently, which is having negative financial implications (we discussed and processed this, and I will link him with Kenneth Fitzgerald of [facility]).  He has an appt with Dr. Nedra Fitzgerald in ~ two weeks, which carries with it some anticipatory anxiety and worries related to his prognosis.  Taken together, Kenneth Fitzgerald is experiencing more down times than usual.  That said, he denied experiencing severe depressive symptoms and expressed optimism that he will do better once I get my strength back.  He has reconnected with his church group since being discharged, which has been a bright spot.    During his recent hospitalization, Kenneth Fitzgerald sister had word relayed to me that she was concerned about the status and cleanliness of his home living conditions. Kenneth Fitzgerald and I discussed it at the time; he largely corroborated his sister's concerns and committed to attend to this at home.  Today, he reported that he has made some progress- very slow progress, but progress. I work at it for 15-20 minutes at time until I get tired.  He has plans to continue to chip away at it as his stamina allows. Will continue to follow.  ??  Pt??continues to take Cymbalta as prescribed by??Dr. Lawernce Fitzgerald.  ??  I provided??Kenneth Fitzgerald with??support and understanding, and we discussed and processed??his??thoughts and feelings in depth.????We problem-solved cognitive and behavioral??approaches to address and tolerate distress and depressive symptoms, and to enhance his adaptive coping.  Kenneth Fitzgerald??was engaged and receptive.?? We agreed to meet next on the day after his appt with Dr. Nedra Fitzgerald.  ??  Mental Status Exam:  Appearance:  ?? ??Appears stated age   Motor: ?? ??No abnormal movements   Speech/Language:  ?? ??Normal rate, volume, tone, fluency   Mood: ?? ??tired mostly   Affect: ?? ??engaged; pleasant   Thought process: ?? ??Logical, linear, clear, coherent, goal directed   Thought content: ?? ?? ??Denies SI/HI??or thoughts of??self-harm   Perceptual disturbances: ?? ?? ??Behavior not concerning for response to internal stimuli  ??   Orientation: ?? ??Oriented to person, place, time, and general circumstances   Attention: ?? ??Able to fully attend without fluctuations in consciousness   Concentration: ?? ??Able to fully concentrate and attend   Memory: ?? ??Immediate, short-term, long-term, and recall grossly intact   ??Fund of knowledge:  ?? ??Consistent with level of education and development   Insight: ?? ?? ??Intact   Judgment:  ?? ??Intact   Impulse Control: ?? ??Intact   ??  Diagnosis: Recurrent major depressive disorder  ??  [name]. Khy Pitre PhD  January 31,??2022

## 2020-02-14 NOTE — Unmapped (Deleted)
Repair of Recurrent Incarcerated Ventral and Incisional Hernia    Surgeon: Dr. Thomes Cake    Date of Surgery: 01/24/20    Admission: 01/18/20    Discharge: 02/02/20     Discharge  Diagnoses:   - Small bowel obstruction  - Recurrent incarcerated ventral hernia  - Metastatic melanoma    Pathology  Final Diagnosis   Date Value Ref Range Status   01/24/2020   Final    A: Soft tissue, peritoneal implant, biopsy   - Melanophages involving peritoneal soft tissue, consistent with treatment response  - No viable melanoma identified  - Mineralized bone and marrow elements    B: Hernia sac, excision  - Benign partially mesothelial lined fibroadipose tissue, clinically hernia sac  - No malignancy identified    This electronic signature is attestation that the pathologist personally reviewed the submitted material(s) and the final diagnosis reflects that evaluation.          Has the patient been in the ER since D/C: ***

## 2020-02-15 ENCOUNTER — Encounter: Admit: 2020-02-15 | Discharge: 2020-02-16 | Payer: PRIVATE HEALTH INSURANCE

## 2020-02-15 DIAGNOSIS — Z09 Encounter for follow-up examination after completed treatment for conditions other than malignant neoplasm: Principal | ICD-10-CM

## 2020-02-15 NOTE — Unmapped (Signed)
[facility] ACUTE CARE, and GENERAL SURGERY   - Follow-up Appointment -  02/15/2020       Patient: Kenneth Fitzgerald [facility]  DOB: 21-Aug-1975  Primary Care Provider:  Ozzie Hoyle DRAPER, DO      Hospitalization for: Ventral hernia with SBO  Discharged on: 02/02/20      Subjective: Patient states his blake drain has been putting out 100-125cc of SS fluid each day. He continues to wear his abdominal binder at all times except for showering. He denies any nausea or emesis and has 1-2 well formed bowel movements per day. He recently completed his radiation therapy for melanoma metastases. He says he feels tired and worn out and has pain only around the drain site.      BP 140/96 (BP Site: L Arm, BP Position: Sitting, BP Cuff Size: X-Large)  - Pulse 112  - Temp 36.7 ??C (Oral)  - Resp 18  - Ht 188 cm (6' 2.02)  - Wt (!) 141.1 kg (311 lb)  - SpO2 99%  - BMI 39.91 kg/m??   Estimated body mass index is 39.91 kg/m?? as calculated from the following:    Height as of this encounter: 188 cm (6' 2.02).    Weight as of this encounter: 141.1 kg (311 lb).        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

## 2020-02-15 NOTE — Unmapped (Signed)
[facility] Refill Coordination Note    Specialty Medication(s) to be Shipped:   Hematology/Oncology: Kenneth Fitzgerald and [name]    Other medication(s) to be shipped: No additional medications requested for fill at this time     Kenneth Fitzgerald, DOB: [birth year]  Phone: [phone] (home)       All above HIPAA information was verified with patient.     Was a Nurse, learning disability used for this call? No    Completed refill call assessment today to schedule patient's medication shipment from the [facility] [phone]).       Specialty medication(s) and dose(s) confirmed: Regimen is correct and unchanged.   Changes to medications: [name] reports no changes at this time.  Changes to insurance: No  Questions for the pharmacist: No    Confirmed patient received Welcome Packet with first shipment. The patient will receive a drug information handout for each medication shipped and additional FDA Medication Guides as required.       DISEASE/MEDICATION-SPECIFIC INFORMATION        N/A    SPECIALTY MEDICATION ADHERENCE     Medication Adherence    Patient reported X missed doses in the last month: 0  Specialty Medication: Braftovi  Patient is on additional specialty medications: Yes  Additional Specialty Medications: Meftovi  Patient Reported Additional Medication X Missed Doses in the Last Month: 0  Patient is on more than two specialty medications: No                Braftovi 75 mg: 12 days of medicine on hand   [name] 15 mg: 12 days of medicine on hand          SHIPPING     Shipping address confirmed in Epic.     Delivery Scheduled: Yes, Expected medication delivery date: 02/24/20.     Medication will be delivered via UPS to the prescription address in Epic WAM.    Unk Lightning   [facility] Specialty Technician

## 2020-02-16 MED ORDER — DULOXETINE 30 MG CAPSULE,DELAYED RELEASE
ORAL_CAPSULE | Freq: Two times a day (BID) | ORAL | 3 refills | 30 days | Status: CP
Start: 2020-02-16 — End: ?

## 2020-02-23 NOTE — Unmapped (Signed)
[facility] 's entire shipment will be delayed as a result of the medication is too soon to refill until 02/25/20.     I have reached out to the patient and communicated the delivery change. We will reschedule the medication for the delivery date that the patient agreed upon.  We have confirmed the delivery date as 02/28/20, via ups.

## 2020-02-25 MED FILL — MEKTOVI 15 MG TABLET: ORAL | 30 days supply | Qty: 180 | Fill #4

## 2020-02-25 MED FILL — BRAFTOVI 75 MG CAPSULE: ORAL | 30 days supply | Qty: 180 | Fill #4

## 2020-03-01 ENCOUNTER — Encounter
Admit: 2020-03-01 | Discharge: 2020-03-02 | Payer: PRIVATE HEALTH INSURANCE | Attending: Hematology & Oncology | Primary: Hematology & Oncology

## 2020-03-01 ENCOUNTER — Encounter: Admit: 2020-03-01 | Discharge: 2020-03-02 | Payer: PRIVATE HEALTH INSURANCE

## 2020-03-01 DIAGNOSIS — C439 Malignant melanoma of skin, unspecified: Principal | ICD-10-CM

## 2020-03-01 LAB — COMPREHENSIVE METABOLIC PANEL
ALBUMIN: 3.8 g/dL (ref 3.4–5.0)
ALKALINE PHOSPHATASE: 64 U/L (ref 46–116)
ALT (SGPT): 9 U/L — ABNORMAL LOW (ref 10–49)
ANION GAP: 9 mmol/L (ref 5–14)
AST (SGOT): 18 U/L (ref <=34)
BILIRUBIN TOTAL: 0.3 mg/dL (ref 0.3–1.2)
BLOOD UREA NITROGEN: 12 mg/dL (ref 9–23)
BUN / CREAT RATIO: 13
CALCIUM: 8.4 mg/dL — ABNORMAL LOW (ref 8.7–10.4)
CHLORIDE: 106 mmol/L (ref 98–107)
CO2: 26 mmol/L (ref 20.0–31.0)
CREATININE: 0.96 mg/dL
EGFR CKD-EPI AA MALE: >90 mL/min/{1.73_m2} (ref >=60)
EGFR CKD-EPI NON-AA MALE: >90 mL/min/{1.73_m2} (ref >=60)
GLUCOSE RANDOM: 99 mg/dL (ref 70–179)
POTASSIUM: 3.8 mmol/L (ref 3.4–4.5)
PROTEIN TOTAL: 6.7 g/dL (ref 5.7–8.2)
SODIUM: 141 mmol/L (ref 135–145)

## 2020-03-01 LAB — CBC W/ AUTO DIFF
BASOPHILS ABSOLUTE COUNT: 0.1 10*9/L (ref 0.0–0.1)
BASOPHILS RELATIVE PERCENT: 0.8 %
EOSINOPHILS ABSOLUTE COUNT: 0.2 10*9/L (ref 0.0–0.4)
EOSINOPHILS RELATIVE PERCENT: 3.3 %
HEMATOCRIT: 39.2 % — ABNORMAL LOW (ref 41.0–53.0)
HEMOGLOBIN: 11.7 g/dL — ABNORMAL LOW (ref 13.5–17.5)
LARGE UNSTAINED CELLS: 2 % (ref 0–4)
LYMPHOCYTES ABSOLUTE COUNT: 1.1 10*9/L — ABNORMAL LOW (ref 1.5–5.0)
LYMPHOCYTES RELATIVE PERCENT: 18.4 %
MEAN CORPUSCULAR HEMOGLOBIN CONC: 29.9 g/dL — ABNORMAL LOW (ref 31.0–37.0)
MEAN CORPUSCULAR HEMOGLOBIN: 24.4 pg — ABNORMAL LOW (ref 26.0–34.0)
MEAN CORPUSCULAR VOLUME: 81.7 fL (ref 80.0–100.0)
MEAN PLATELET VOLUME: 10.2 fL — ABNORMAL HIGH (ref 7.0–10.0)
MONOCYTES ABSOLUTE COUNT: 0.4 10*9/L (ref 0.2–0.8)
MONOCYTES RELATIVE PERCENT: 6.5 %
NEUTROPHILS ABSOLUTE COUNT: 4.3 10*9/L (ref 2.0–7.5)
NEUTROPHILS RELATIVE PERCENT: 69.5 %
PLATELET COUNT: 297 10*9/L (ref 150–440)
RED BLOOD CELL COUNT: 4.8 10*12/L (ref 4.50–5.90)
RED CELL DISTRIBUTION WIDTH: 16.8 % — ABNORMAL HIGH (ref 12.0–15.0)
WBC ADJUSTED: 6.2 10*9/L (ref 4.5–11.0)

## 2020-03-01 LAB — SLIDE REVIEW

## 2020-03-01 LAB — LACTATE DEHYDROGENASE: LACTATE DEHYDROGENASE: 201 U/L (ref 120–246)

## 2020-03-01 MED ORDER — MAGIC MOUTHWASH (NYSTATIN/DIPHENHYDRAMINE/MYLANTA) WITH LIDOCAINE ORAL MIXTURE
Freq: Four times a day (QID) | ORAL | 0 refills | 0 days | Status: CP | PRN
Start: 2020-03-01 — End: ?

## 2020-03-01 NOTE — Unmapped (Signed)
Stay on BRAF/MEK  See me back in about 3 weeks w/CT chest and MRI abdomen.     For appointments & questions Monday through Friday 8 AM???5 PM     Please call [phone] or Toll free [phone]    For urgent clinical needs on Nights, Weekends or Holidays  Call [phone] and ask for the oncologist on call.     For appointment changes please contact during normal business hours.     Please visit PrivacyFever.cz, a resource created just for family members and caregivers.  This website lists support services, how and where to ask for help. It has tools to assist you as you help [place] care for your loved one.    [facility]  [address]  [place], [place] [address]  [email or web address]

## 2020-03-01 NOTE — Unmapped (Signed)
PRIMARY CARE PHYSICIAN  TIMOTHY R DRAPER, DO  [address]  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   S/p thyroidectomy 06/2011 and radioactive iodine 03/2012 and again 05/2013.    Thyroid CA recurrence 11/2012 s/p L neck dissecion 12/07/2012.  2nd thyroid CA recurrence 03/2013 AND co-existing 1st melanoma recurrence 03/2013 s/p R cervical level 2-4 node dissection and partial parotidectomy  S/p radiation to R neck, completed 06/2013  Nivolumab (02/12/18 x 2 cycles) complicated by colitis that required a 6 week steroid taper.     CURRENT THERAPY:   BRAF/MEK started early May 2020    ASSESSMENT: Kenneth Fitzgerald is a pleasant 45 y.o. male who presents today for further evaluation of recurrent stage IV melanoma.  He completed adjuvant high dose IFN in Jan 2014. Since then, thyroid CA has recurred twice and melanoma recurred locally and then distantly. Now w/metastatic disease in abdominal/pelvic LN, on BRAFtovi/[name].    PLAN  1. Stage IV melanoma: BRAF+ by IHC. Dr Eyvonne Left reviewed path: TILS present, non-brisk; lymphocytes are 20 to 30% the number of melanoma cells.  Known sites of disease include abdominal/pelvic LN.  Brain indeterminate.   Started dabrafenib/trametinib mid-May 2020, has had stable disease.  Recently hospitalized for mgmt of incarcerated ventral hernia.      -Reviewed path from hernia surgery, noted that the abdominal wall/peritoneal implants contained melanophages, but no melanoma cells.  We discussed that this is interesting, good to know that there wasn't melanoma in the biopsy, is in keeping w/immune response.  That said, still need close monitoring.    - He is back on BRAF/MEK targeted therapy.  Will continue.   - CT scan abdomen Jan 23, 2020, CT chest Nov 10, 2020,  and abdominal MRI Nov 25, 2019 stable, unfortunately November brain MRI w/innumerable punctate lesions.   - Neuro tumor board felt brain MRI c/w metastatic disease.    - He is s/p WBRT, completed 01/20/20. Has f/u MRI planned for April 19, 2020.  - At this point, he is losing weight scans are growing out of date, so we agree that it makes sense to obtain new images to assess disease status.  RTC in 3 weeks w/new CT chest and brain MRI.   -Of note, we have attempted to confirm BRAF on NGS twice, on original primary specimen from July 2012 and and pelvic LN sample from 01/09/2018.  Both samples were insufficient tissue for analysis.  At this point, BRAF is known by IHC only.     #.) Atypical Crohn's; recent colonoscopy w/ulcerations and crypt abscesses.  Sulfasalazine started.      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 During hospital stay, it was determined that this was malignant thrombus and no AC recommended.  Will follow.     #.) Hernia repair; all scars healing well and he is eating and drinking normally.     #.) Thyroid CA, recurrent. Well controlled now. Followed by endocrinology for thyroid and Ca+ replacement and ongoing monitoring of glucose.    #.) Mood; excessive stress related to h/o 2 malignancies, job loss and legal issues surrounding, parents both passed away in recent years.  He follows w/ Dr. Willaim Bane.     #.) HypoCA; he struggles w/compliance during the workday,cont Ca supplementation      INTERVAL HISTORY: This is a return visit to the [facility] for further evaluation of melanoma.    Hospitalize 01/18/20-02/02/20 for mgmt of incarcerated ventral hernia.  Today he feels:   ++fatigue, attributed to WBRT, was told it would take 6mos to recover.   - Weight is down about 30lbs unintentionally  - B lower extremity, knee-down, intermittent pins and needles feeling.   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  Will be a 40hr week soon, no more nights and weekends.   - has been walking the dog for exercise  - continues BRAF/MEK  - no persistent fevers, chills  - No N/V  - follows w/Dr. Willaim Bane, finds this helpful.  - Usually does 2 glasses/milk and 1500mg  Ca+  - [employer], still thinking about buying an RV  -no other changes      PAST MEDICAL HISTORY:   1. Diverticulitis.   2. History of ankylosing spondylitis in 2009. Please see past medical history dictated on 11/15/2010 for details. In brief, he was treated w/Enbrel and prednisone for 6mos or so.  Symptoms improved, immunosuppressants were stopped and symptoms did not recur.   3. S/p incisional hernia repair       SOCIAL HISTORY:    reports that he has never smoked. He has never used smokeless tobacco. He reports current alcohol use. He reports that he does not use drugs.      MEDICATIONS:   Current Outpatient Medications   Medication Sig Dispense Refill   ??? acetaminophen (TYLENOL) 500 MG tablet Take 2 tablets (1,000 mg total) by mouth every eight (8) hours as needed for pain. 30 tablet 0   ??? binimetinib ([name]) 15 mg tablet Take 3 tablets (45 mg total) by mouth Two (2) times a day. 180 tablet 11   ??? blood sugar diagnostic Strp Dispense 100 blood glucose test strips, ok to sub any brand preferred by insurance/patient, use 3x/day; dispense whatever brand matches with meter. 100 strip 12   ??? blood-glucose meter kit Use as instructed; dispense 1 meter, whatever is preferred by insurance 1 each 1   ??? calcitrioL (ROCALTROL) 0.25 MCG capsule Take 1 capsule (0.25 mcg total) by mouth daily. 30 capsule 11   ??? calcium carbonate 650 mg calcium (1,625 mg) tablet Take 1 tablet by mouth Three (3) times a day with a meal.     ??? DULoxetine (CYMBALTA) 30 MG capsule Take 1 capsule (30 mg total) by mouth Two (2) times a day. 60 capsule 3   ??? encorafenib (BRAFTOVI) 75 mg capsule Take 6 capsules (450 mg total) by mouth daily. 180 capsule 11   ??? fexofenadine (ALLEGRA ALLERGY) 180 MG tablet Take 180 mg by mouth daily as needed (allergies).      ??? folic acid (FOLVITE) 1 MG tablet Take 1 tablet (1 mg total) by mouth daily. 30 tablet 11   ??? ibuprofen (ADVIL,MOTRIN) 200 MG tablet Take 600 mg by mouth daily as needed for pain.     ??? lancets Misc Dispense 100 lancets, ok to sub any  brand preferred by insurance/patient, use 3x/day 100 each 12   ??? levothyroxine (SYNTHROID) 125 MCG tablet Take by mouth daily. Take 2 tablets (250 mcg total) every Sat and Sun. Take 3 tablets (375 mcg total) every Mon, Tue, Wed, Thur and Fri.     ??? melatonin 3 mg Tab Take 1 tablet (3 mg total) by mouth every evening. 90 tablet 1   ??? memantine (NAMENDA) 10 MG tablet Take 10 mg by mouth daily.     ??? metFORMIN (GLUCOPHAGE) 500 MG tablet Take 1 tablet (500 mg total) by mouth 2 (two) times a day with meals. (Patient taking differently: Take 500 mg by mouth daily. ) 180 tablet 3   ??? sulfaSALAzine (AZULFIDINE) 500 mg tablet Take 2 tablets (1000mg ) by mouth twice daily for 7 days, then take 4 tablets (2000mg ) twice daily thereafter. 240 tablet 11   ??? aluminum-magnesium hydroxide-simethicone 200-200-20 mg/5 mL Susp 80 mL, diphenhydrAMINE 12.5 mg/5 mL Liqd 200 mg, nystatin 100,000 unit/mL Susp 8,000,000 Units, distilled water Liqd 80 mL, lidocaine 2% viscous 2 % Soln 80 mL Take 5 mL by mouth every six (6) hours as needed. Swish, gargle, spit as needed.  May be swallowed if esophageal involvement. 100 mL 0     No current facility-administered medications for this visit.       REVIEW OF SYSTEMS  A complete review of systems was obtained and was negative except for those symptoms listed in the HPI     PHYSICAL EXAM  Vitals:    03/01/20 1301   BP: 128/95   Pulse: 83   Resp: 18   Temp: 36.7 ??C (98.1 ??F)   SpO2: 99%       GEN: Awake and alert, pleasant appearing male in no acute distress  LUNGS: CTAB, no W/R/R  CV: distant heart sounds, no W/R/R  ABD; obese, soft NTND  SKIN: No rashes, petechiae or jaundice noted.  All melanoma scarlines examined; there is a very small skin breakdown on top of head.  Appears benign, but will follow closely. Cyst mid-back.   PYSCH: Alert and oriented to person, place and time  EXT: No edema noted of the lower extremity noted.          LABS  Lab Results   Component Value Date    WBC 6.2 03/01/2020    HGB 11.7 (L) 03/01/2020    HCT 39.2 (L) 03/01/2020    PLT 297 03/01/2020       Lab Results   Component Value Date    NA 141 03/01/2020    K 3.8 03/01/2020    CL 106 03/01/2020    CO2 26.0 03/01/2020    BUN 12 03/01/2020    CREATININE 0.96 03/01/2020    GLU 99 03/01/2020    CALCIUM 8.4 (L) 03/01/2020    MG 1.8 02/01/2020    PHOS 4.9 02/01/2020       Lab Results   Component Value Date    BILITOT 0.3 03/01/2020    BILIDIR 0.20 01/27/2020    PROT 6.7 03/01/2020    ALBUMIN 3.8 03/01/2020    ALT 9 (L) 03/01/2020    AST 18 03/01/2020    ALKPHOS 64 03/01/2020    GGT 42 08/22/2011       Lab Results   Component Value Date    INR 1.12 01/24/2020       RADIOLOGY RESULTS;  Abd MRI 11/25/19:  ??  ??  IMPRESSION:  ??  --Examination is limited due to extensive  motion artifact.  ??  --Unchanged size and number of metastatic peritoneal implants. No new lesions are seen.  ??  -- Unchanged size of a 1.9 cm indeterminate lesion in the L2 vertebral body, now hypoenhancing or nonenhancing. Ongoing follow-up is suggested.  ??  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  ??  --Please see same-day CT chest for full report of findings above the diaphragm    Brain MRI 11/25/19:  IMPRESSION:  --Interval increase in number and conspicuity of multiple bilateral T1 hyperintense lesions are seen throughout the cerebrum and the cerebellum. Some of these lesions may demonstrate subtle enhancement. Findings are concerning for diffuse micrometastases.  ??  --Rim-enhancing lesion within the superior right parietal bone, concerning for osseous metastasis.  ??  -- No discrete leptomeningeal enhancement is identified      CT chest 11/11/19:  IMPRESSION:  No evidence of thoracic metastasis.      PET 05/12/19:  IMPRESSION:  - The sclerotic lesion with central lucency in the L2 vertebral body which displays enhancement on recent MRI does not show any evidence of FDG uptake and is favored to be benign.  ??  -Multiple enlarged hypermetabolic abdominal and pelvic lymph nodes and peritoneal implants concerning for metastases, grossly unchanged since MRI dated 04/29/2019.  ??  -Interval increase in the size of the focal uptake with corresponding soft tissue lesion in the mid transverse colon. Recommend further evaluation with colonoscopy.    Echo 04/29/19:  Summary    1. The left ventricle is normal in size with normal wall thickness.    2. The left ventricular systolic function is normal, LVEF is visually  estimated at 55%.    3. The right ventricle is upper normal in size, with normal systolic  function.          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  B: Lymph node, pelvic, core needle biopsy  - Metastatic melanoma  - See comment  ??     FNA R femur 07/19/14:  : Bone, right femur, core biopsy with touch imprints  - No primary or metastatic malignancy identified  - Bone with focal osteocyte dropout, suggestive of necrosis  - Bone marrow with trilineage hematopoiesis and focal effacement by chronic inflammation (see comment)    B: Bone, right femur, fine needle aspiration  - Few atypical cells present on direct smears (see comment)  - Scant fragments of edematous stromal tissue in cell block  - Bone marrow with trilineage hematopoiesis    Diagnosis:  Lymph nodes, right cervical, levels 2-4, removal and partial parotidectomy  -Metastatic melanoma involving 1 out of 20 lymph nodes, with the largest  diameter measuring 10 mm (1.0 cm) and no evidence of extracapsular extension  (1/20)  -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

## 2020-03-02 ENCOUNTER — Encounter: Admit: 2020-03-02 | Discharge: 2020-03-03 | Payer: PRIVATE HEALTH INSURANCE | Attending: Clinical | Primary: Clinical

## 2020-03-05 MED ORDER — MEMANTINE 5 MG TABLET
0 days
Start: 2020-03-05 — End: 2020-03-22

## 2020-03-08 NOTE — Unmapped (Signed)
[facility]  Psychiatry  Psychotherapy Note - Telehealth via??Video  ??  Service Date: February 17,??2022  Service:??50??minutes of??psychotherapy via??video??session  Time with Patient:??50??minutes  ??  Encounter Description:??This encounter was conducted from provider's office via EPIC video session due to COVID-19 pandemic. Hilary Pundt was located in his home. Rationale for??video session??is need to social distance. See Plan for telemedicine consent/disclaimer.   ??  Encounter Description/Consent:??  [name] visit was completed through telehealth encounter (video).   ????  This patient encounter is appropriate and reasonable under the circumstances. The patient has been advised of the potential risks and limitations of this mode of treatment (including, but not limited to, the absence of in-person examination) and has agreed to be treated in a remote fashion in spite of them. Any and all of the patient's/patient's family's questions on this issue have been answered.     The patient was physically located in [place] in which I am permitted to provide care. The patient??understood that??he may incur co-pays and cost sharing, and agreed to the telemedicine visit. The visit was??reasonable and appropriate under the circumstances given the patient's presentation at the time.   ????  Time Spent: 50??minutes  ??  Assessment:  Mr. Akamine a 44yo??male??with metastatic melanoma, h/o thyroid cancer, recent portal vein thrombosis, who I see in therapy to address depression and enhance coping with??multiple??stressors.?? We met today for session via telehealth (video).????Mr. Ellinwood endorsed some depressive symptoms related to medical stressors and recovery from hernia and prolonged hospital stay.  He has returned to work and is more active, which has helped. Anxiety about his prognosis and ongoing medical uncertainty and stressors remain.  No urgent or emergent psych concerns.  I offered psychotherapy, support,??and recommendations.  Will see next in three weeks.  ??  Risk Assessment:  A suicide and violence risk assessment was performed as part of this evaluation. There patient is deemed to be at chronic elevated risk??for self-harm/suicide given the following factors: current diagnosis of depression, suicidal ideation or threats without a plan and past diagnosis of depression. There patient is deemed to be at chronic elevated risk??for violence given the following factors: male gender. These risk factors are mitigated by the following factors: lack of active SI/HI. There is no acute risk for suicide or violence at this time. The patient was educated about relevant modifiable risk factors including following recommendations for treatment of psychiatric illness and abstaining from substance abuse.??While future psychiatric events cannot be accurately predicted, the patient does not currently require acute inpatient psychiatric care and does not currently meet [facility] involuntary commitment criteria.??????????????????  ??  Plan:  1.??I will continue to see pt for psychotherapy. ??Next session??is??scheduled??to take place??on 3/14.  2. Pt??has??information on available [facility] resources.  I reached out today to [name] to explore financial resources.  3.??Pt??followed by Dr. Lawernce Keas of Psychiatry.  4. Pt has??my contact information and knows??to reach me as needed.  ??  Subjective:??  Mr. Mcmillon??presented as alert and oriented.????He was engaged and cooperative, and did not present as highly anxious or depressed during our time together.    Mr. Bostock endorsed depressed mood and some anhedonia as he continues to cope with medical stressors and recovery from hernia surgery and a prolonged hospital stay. He described his meeting with Dr. Nedra Hai from earlier this week.  Mr. Enis noted that what weighs on him the most are his uncertain and stressful medical situation and his challenging prognosis.  He denied hopelessness or any thoughts of  self-harm. He has been able to return to work, increase his activity level, and be involved with his church group, which have been good.  ??  Regarding the condition of his home, Mr. Rollo stated that he has only made slight progress due to wanting/needing to conserve his energy for other activities and for work.  He acknowledged that he should be doing more about this, and we problem-solved ways for him to make more headway.  He did note that he is make sure not to add to the problem by accumulating more stuff. Will continue to follow.  ??  Pt??continues to take Cymbalta as prescribed.  ??  I provided??Mr. Arya with??support and understanding, and we??discussed and processed??his??thoughts and feelings??in depth.????We problem-solved??cognitive and behavioral??approaches to??address and??tolerate distress and depressive symptoms, and to enhance his adaptive coping.  Mr. Malter??was engaged and receptive.  ??  Mental Status Exam:  Appearance:  ?? ??Appears stated age   Motor: ?? ??No abnormal movements   Speech/Language:  ?? ??Normal rate, volume, tone, fluency   Mood: ?? ??okay   Affect: ?? ??engaged; pleasant   Thought process: ?? ??Logical, linear, clear, coherent, goal directed   Thought content: ?? ?? ??Denies SI/HI??or thoughts of??self-harm   Perceptual disturbances: ?? ?? ??Behavior not concerning for response to internal stimuli  ??   Orientation: ?? ??Oriented to person, place, time, and general circumstances   Attention: ?? ??Able to fully attend without fluctuations in consciousness   Concentration: ?? ??Able to fully concentrate and attend   Memory: ?? ??Immediate, short-term, long-term, and recall grossly intact   ??Fund of knowledge:  ?? ??Consistent with level of education and development   Insight: ?? ?? ??Intact   Judgment:  ?? ??Intact   Impulse Control: ?? ??Intact   ??  Diagnosis: Recurrent major depressive disorder  ??  [name]. Carline Dura PhD  February 23,??2022

## 2020-03-13 DIAGNOSIS — C719 Malignant neoplasm of brain, unspecified: Principal | ICD-10-CM

## 2020-03-13 MED ORDER — DULOXETINE 30 MG CAPSULE,DELAYED RELEASE
ORAL_CAPSULE | Freq: Two times a day (BID) | ORAL | 2 refills | 90 days | Status: CP
Start: 2020-03-13 — End: ?

## 2020-03-13 NOTE — Unmapped (Signed)
[facility] Refill Coordination Note    Specialty Medication(s) to be Shipped:   Hematology/Oncology: [name] and [name]    Other medication(s) to be shipped: No additional medications requested for fill at this time     Kenneth Fitzgerald, DOB: 23-Dec-1975  Phone: [phone] (home)       All above HIPAA information was verified with patient.     Was a Nurse, learning disability used for this call? No    Completed refill call assessment today to schedule patient's medication shipment from the [facility] [phone]).       Specialty medication(s) and dose(s) confirmed: Regimen is correct and unchanged.   Changes to medications: Dagoberto reports no changes at this time.  Changes to insurance: No  Questions for the pharmacist: No    Confirmed patient received Welcome Packet with first shipment. The patient will receive a drug information handout for each medication shipped and additional FDA Medication Guides as required.       DISEASE/MEDICATION-SPECIFIC INFORMATION        N/A    SPECIALTY MEDICATION ADHERENCE     Medication Adherence    Patient reported X missed doses in the last month: 2  Specialty Medication: Braftovi 75mg   Patient is on additional specialty medications: Yes  Additional Specialty Medications: [name] 15mg   Patient Reported Additional Medication X Missed Doses in the Last Month: 2  Patient is on more than two specialty medications: No  Informant: patient                [name] 15 mg: 18 days of medicine on hand   Braftovi 75 mg: 18 days of medicine on hand         SHIPPING     Shipping address confirmed in Epic.     Delivery Scheduled: Yes, Expected medication delivery date: 03/28/20.     Medication will be delivered via UPS to the prescription address in Epic [place].    [name] M [name]   [facility] Specialty Technician

## 2020-03-13 NOTE — Unmapped (Signed)
I called [name] receiivng his MyChart message and speaking with Dr.  Nedra Hai.   He is concerned something is worse in his brain. His headaches are not terrible but they are always present. He described pint tenderness on his scalp but cannot visualize any injury/trauma there.   He has been nauseous when lying down at HS; he has not tried any antiemetic because he did not want to mask anything before hearing from [place]. He is also dizzy if she stand too quickly.   He is trying to stay hydrated although things taste off.    We will try to get the brain MRI asap. I told him he may try ondansetron to see if that helps. I also asked him to let me know if the headaches worsen, as we can start dexamethasone in the interim. Marland Kitchen

## 2020-03-14 NOTE — Unmapped (Signed)
I spoke with patient Kenneth Fitzgerald to confirm appointments on the following date mri tomorrow    [name]

## 2020-03-15 ENCOUNTER — Encounter: Admit: 2020-03-15 | Discharge: 2020-03-16 | Payer: PRIVATE HEALTH INSURANCE

## 2020-03-15 MED ADMIN — gadobenate dimeglumine (MULTIHANCE) 529 mg/mL (0.1mmol/0.2mL) solution 20 mL: 20 mL | INTRAVENOUS | @ 22:00:00 | Stop: 2020-03-15

## 2020-03-16 DIAGNOSIS — R930 Abnormal findings on diagnostic imaging of skull and head, not elsewhere classified: Principal | ICD-10-CM

## 2020-03-16 DIAGNOSIS — E278 Other specified disorders of adrenal gland: Principal | ICD-10-CM

## 2020-03-16 MED ORDER — CALCITRIOL 0.25 MCG CAPSULE
ORAL_CAPSULE | Freq: Every day | ORAL | 11 refills | 30 days | Status: CP
Start: 2020-03-16 — End: ?

## 2020-03-16 MED ORDER — DEXAMETHASONE 2 MG TABLET
ORAL_TABLET | Freq: Two times a day (BID) | ORAL | 0 refills | 30.00000 days | Status: CP
Start: 2020-03-16 — End: 2021-03-16

## 2020-03-16 MED ORDER — MAGIC MOUTHWASH (NYSTATIN/DIPHENHYDRAMINE/MYLANTA) WITH LIDOCAINE ORAL MIXTURE
Freq: Four times a day (QID) | ORAL | 0 refills | 0 days | Status: CP | PRN
Start: 2020-03-16 — End: ?

## 2020-03-22 ENCOUNTER — Ambulatory Visit
Admit: 2020-03-22 | Discharge: 2020-04-13 | Payer: PRIVATE HEALTH INSURANCE | Attending: Hematology & Oncology | Primary: Hematology & Oncology

## 2020-03-22 ENCOUNTER — Encounter: Admit: 2020-03-22 | Discharge: 2020-04-13 | Payer: PRIVATE HEALTH INSURANCE

## 2020-03-22 ENCOUNTER — Encounter
Admit: 2020-03-22 | Discharge: 2020-04-13 | Payer: PRIVATE HEALTH INSURANCE | Attending: Radiation Oncology | Primary: Radiation Oncology

## 2020-03-22 ENCOUNTER — Ambulatory Visit: Admit: 2020-03-22 | Discharge: 2020-04-13 | Payer: PRIVATE HEALTH INSURANCE

## 2020-03-22 DIAGNOSIS — C439 Malignant melanoma of skin, unspecified: Principal | ICD-10-CM

## 2020-03-22 MED ORDER — ONDANSETRON HCL 8 MG TABLET
ORAL_TABLET | Freq: Three times a day (TID) | ORAL | 2 refills | 20 days | Status: CP | PRN
Start: 2020-03-22 — End: 2020-04-21

## 2020-03-22 MED ADMIN — iohexoL (OMNIPAQUE) 350 mg iodine/mL solution 75 mL: 75 mL | INTRAVENOUS | @ 16:00:00 | Stop: 2020-03-22

## 2020-03-22 MED ADMIN — gadobenate dimeglumine (MULTIHANCE) 529 mg/mL (0.1mmol/0.2mL) solution 10 mL: 10 mL | INTRAVENOUS | @ 16:00:00 | Stop: 2020-03-22

## 2020-03-22 NOTE — Unmapped (Signed)
PRIMARY CARE PHYSICIAN  TIMOTHY R DRAPER, DO  [address]  [place] [address]    CONSULTING PHYSICIANS  Ralene Cork, Do  [address]  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   S/p thyroidectomy 06/2011 and radioactive iodine 03/2012 and again 05/2013.    Thyroid CA recurrence 11/2012 s/p L neck dissecion 12/07/2012.  2nd thyroid CA recurrence 03/2013 AND co-existing 1st melanoma recurrence 03/2013 s/p R cervical level 2-4 node dissection and partial parotidectomy  S/p radiation to R neck, completed 06/2013  Nivolumab (02/12/18 x 2 cycles) complicated by colitis that required a 6 week steroid taper.     CURRENT THERAPY:   BRAF/MEK started early May 2020    ASSESSMENT: [name] is a pleasant 45 y.o. male who presents today for further evaluation of recurrent stage IV melanoma.  He completed adjuvant high dose IFN in Jan 2014. Since then, thyroid CA has recurred twice and melanoma recurred locally and then distantly. Now w/metastatic disease in abdominal/pelvic LN, on BRAFtovi/[name].    PLAN  1. Stage IV melanoma: BRAF+ by IHC. Dr Eyvonne Left reviewed path: TILS present, non-brisk; lymphocytes are 20 to 30% the number of melanoma cells.  Known sites of disease include abdominal/pelvic, and brain. Started dabrafenib/trametinib mid-May 2020, has had stable disease.      -Reviewed path from hernia surgery, noted that the abdominal wall/peritoneal implants contained melanophages, but no melanoma cells.  We discussed that this is interesting, good to know that there wasn't melanoma in the biopsy, is in keeping w/immune response.  That said, still need close monitoring.    - He is back on BRAF/MEK targeted therapy.  Will continue.   - He is s/p WBRT, completed 01/20/20. Has f/u MRI planned for April 19, 2020.  - I personally reviewed CT chest today which is NED, abdominal MRI w/no clear progression of disease.  Will CTM the psoas finding.  It is only 1cm.   - Given weight loss and CNS findings, we talked today about uncertainty of prognosis, but my level of concern is increasing.  We talked about him doing important things like traveling and spending time w/friends. Will c/s palliative care for ongoing support.   -RTC in 6 weeks for clinical follow up.  -Of note, we have attempted to confirm BRAF on NGS twice, on original primary specimen from July 2012 and and pelvic LN sample from 01/09/2018.  Both samples were insufficient tissue for analysis.  At this point, BRAF is known by IHC only.     #.) Brain mets; s/p WBRT as per above.  Sees Dr. Imogene Burn today.  Will get her opinion on scalp tenderness, dry heaves.  In meantime, cont low dose dexamethasone for symptom relief.     #.) Atypical Crohn's; recent colonoscopy w/ulcerations and crypt abscesses.  Sulfasalazine started.      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 During hospital stay, it was determined that this was malignant thrombus and no AC recommended.  Will follow.     #.) Hernia repair; all scars healing well and he is eating and drinking normally.  Will alert his surgeon to the abdominal wall finding.     #.) Thyroid CA, recurrent. Well controlled now. Followed by endocrinology for thyroid and Ca+ replacement and ongoing monitoring of glucose.    #.)  Mood; excessive stress related to h/o 2 malignancies, job loss and legal issues surrounding, parents both passed away in recent years.   He follows w/ Dr. Willaim Bane.     #.) HypoCA; he struggles w/compliance during the workday,cont Ca supplementation      INTERVAL HISTORY: This is a return visit to the [facility] for further evaluation of melanoma.    Hospitalize 01/18/20-02/02/20 for mgmt of incarcerated ventral hernia.      Today he feels:   Had increased HA/scalp pain (tender to touch) and daily emesis/dry heave, so we obtained a brain MRI early.  There was no change.   He had felt better on steroids in the past, so we re-started Dex 2mg  BID.   This helped the emesis/dry heaves and the scalp pain.   Fatigue is stable. Is back to dispatching 40hrs/week.  Appetite is down and he has lost another 13 lbs in 3 weeks.   Denies confusion, but has some dizziness.  Was worried he was dying a few weeks ago when the CNS symptoms began.   B lower extremity, knee-down, intermittent pins and needles feeling ongoing, no change w/steroids. .   No new pain others  No more hematemesis  Bowels are normal for him; recent diagnosis of atypical crohn's disease, started on sulfasalazine 2 tabs BID  No increased abdominal pain   - has been walking the dog for exercise  - continues BRAF/MEK  - no persistent fevers, chills  - No N/V  - follows w/Dr. Willaim Bane, finds this helpful.  - Usually does 2 glasses/milk and 1500mg  Ca+  - [employer], still thinking about buying an RV  -no other changes      PAST MEDICAL HISTORY:   1. Diverticulitis.   2. History of ankylosing spondylitis in 2009. Please see past medical history dictated on 11/15/2010 for details. In brief, he was treated w/Enbrel and prednisone for 6mos or so.  Symptoms improved, immunosuppressants were stopped and symptoms did not recur.   3. S/p incisional hernia repair       SOCIAL HISTORY:    reports that he has never smoked. He has never used smokeless tobacco. He reports current alcohol use. He reports that he does not use drugs.      MEDICATIONS:   Current Outpatient Medications   Medication Sig Dispense Refill   ??? acetaminophen (TYLENOL) 500 MG tablet Take 2 tablets (1,000 mg total) by mouth every eight (8) hours as needed for pain. 30 tablet 0   ??? aluminum-magnesium hydroxide-simethicone 200-200-20 mg/5 mL Susp 80 mL, diphenhydrAMINE 12.5 mg/5 mL Liqd 200 mg, nystatin 100,000 unit/mL Susp 8,000,000 Units, distilled water Liqd 80 mL, lidocaine 2% viscous 2 % Soln 80 mL Take 5 mL by mouth every six (6) hours as needed. Swish, gargle, spit as needed.  May be swallowed if esophageal involvement. 100 mL 0   ??? binimetinib ([name]) 15 mg tablet Take 3 tablets (45 mg total) by mouth Two (2) times a day. 180 tablet 11   ??? blood sugar diagnostic Strp Dispense 100 blood glucose test strips, ok to sub any brand preferred by insurance/patient, use 3x/day; dispense whatever brand matches with meter. 100 strip 12   ??? blood-glucose meter kit Use as instructed; dispense 1 meter, whatever is preferred by insurance 1 each 1   ??? calcitrioL (ROCALTROL) 0.25 MCG capsule Take 1 capsule (0.25 mcg total) by mouth daily. 30 capsule 11   ??? calcium carbonate 650 mg calcium (1,625  mg) tablet Take 1 tablet by mouth Three (3) times a day with a meal.     ??? dexAMETHasone (DECADRON) 2 MG tablet Take 1 tablet (2 mg total) by mouth 2 (two) times a day with meals. 60 tablet 0   ??? DULoxetine (CYMBALTA) 30 MG capsule TAKE 1 CAPSULE (30 MG TOTAL) BY MOUTH TWO (2) TIMES A DAY. 180 capsule 2   ??? encorafenib (BRAFTOVI) 75 mg capsule Take 6 capsules (450 mg total) by mouth daily. 180 capsule 11   ??? fexofenadine (ALLEGRA ALLERGY) 180 MG tablet Take 180 mg by mouth daily as needed (allergies).      ??? folic acid (FOLVITE) 1 MG tablet Take 1 tablet (1 mg total) by mouth daily. 30 tablet 11   ??? ibuprofen (ADVIL,MOTRIN) 200 MG tablet Take 600 mg by mouth daily as needed for pain.     ??? lancets Misc Dispense 100 lancets, ok to sub any brand preferred by insurance/patient, use 3x/day 100 each 12   ??? levothyroxine (SYNTHROID) 125 MCG tablet Take by mouth daily. Take 2 tablets (250 mcg total) every Sat and Sun. Take 3 tablets (375 mcg total) every Mon, Tue, Wed, Thur and Fri.     ??? melatonin 3 mg Tab Take 1 tablet (3 mg total) by mouth every evening. 90 tablet 1   ??? memantine (NAMENDA) 10 MG tablet Take 10 mg by mouth daily.     ??? metFORMIN (GLUCOPHAGE) 500 MG tablet Take 1 tablet (500 mg total) by mouth 2 (two) times a day with meals. (Patient taking differently: Take 500 mg by mouth daily. ) 180 tablet 3   ??? sulfaSALAzine (AZULFIDINE) 500 mg tablet Take 2 tablets (1000mg ) by mouth twice daily for 7 days, then take 4 tablets (2000mg ) twice daily thereafter. 240 tablet 11   ??? fluticasone propionate (FLONASE) 50 mcg/actuation nasal spray 2 sprays into each nostril two (2) times a day. 16 g 6   ??? ondansetron (ZOFRAN) 8 MG tablet Take 1 tablet (8 mg total) by mouth every eight (8) hours as needed for nausea. 60 tablet 2     No current facility-administered medications for this visit.       REVIEW OF SYSTEMS  A complete review of systems was obtained and was negative except for those symptoms listed in the HPI     PHYSICAL EXAM  Vitals:    03/22/20 1335   BP: 133/83   Pulse: 95   Temp: 37 ??C (98.6 ??F)   SpO2: 97%       GEN: Awake and alert, pleasant appearing male in no acute distress  LUNGS: CTAB, no W/R/R  CV: distant heart sounds, no W/R/R  ABD; obese, soft NTND  SKIN: No rashes, petechiae or jaundice noted.  All melanoma scarlines examined; no nodularity.  Appears benign, but will follow closely. Cyst mid-back.   PYSCH: Alert and oriented to person, place and time  EXT: No edema noted of the lower extremity noted.          LABS  Lab Results   Component Value Date    WBC 6.2 03/01/2020    HGB 11.7 (L) 03/01/2020    HCT 39.2 (L) 03/01/2020    PLT 297 03/01/2020       Lab Results   Component Value Date    NA 141 03/01/2020    K 3.8 03/01/2020    CL 106 03/01/2020    CO2 26.0 03/01/2020    BUN 12 03/01/2020    CREATININE  0.96 03/01/2020    GLU 99 03/01/2020    CALCIUM 8.4 (L) 03/01/2020    MG 1.8 02/01/2020    PHOS 4.9 02/01/2020       Lab Results   Component Value Date    BILITOT 0.3 03/01/2020    BILIDIR 0.20 01/27/2020    PROT 6.7 03/01/2020    ALBUMIN 3.8 03/01/2020    ALT 9 (L) 03/01/2020    AST 18 03/01/2020    ALKPHOS 64 03/01/2020    GGT 42 08/22/2011       Lab Results   Component Value Date    INR 1.12 01/24/2020       RADIOLOGY RESULTS;  Abd MRI 03/22/20:    IMPRESSION:  1.There are postsurgical changes along the midline anterior wall. There is a 7.5 cm rim-enhancing fluid collection noted along the left aspect of the surgical incision site that abuts the left rectus abdominis muscle located approximately 4.5 cm beneath the skin surface. The sterility of this collection is unknown. Clinical condition is recommended but hematoma with surrounding chronic inflammatory changes is favored  2.Unchanged size and number of metastatic peritoneal implants. No new lesions are identified.  3.New peripherally rim-enhancing lesion in the left psoas muscle. This is worrisome for an metastasis versus less likely a tiny abscess. Clinical correlation is recommended. Short-term follow-up is recommended. Alternatively further assessment with PET/CT could be considered.  4.Chronic and incidental findings as detailed above.    Brain MRI 03/15/20:  ??  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 No definitive new or enlarging lesions; however, several lesions demonstrate interval microhemorrhage, as compared to 11/25/2019.  ??  Similar size and appearance of an enhancing osseous lesion within the right parietal bone, indeterminate, but may represent an osseous metastasis. Recommend continued attention on follow-up.      CT chest 03/22/20:  IMPRESSION:  No thoracic metastatic disease.      PET 05/12/19:  IMPRESSION:  - The sclerotic lesion with central lucency in the L2 vertebral body which displays enhancement on recent MRI does not show any evidence of FDG uptake and is favored to be benign.  ??  -Multiple enlarged hypermetabolic abdominal and pelvic lymph nodes and peritoneal implants concerning for metastases, grossly unchanged since MRI dated 04/29/2019.  ??  -Interval increase in the size of the focal uptake with corresponding soft tissue lesion in the mid transverse colon. Recommend further evaluation with colonoscopy.    Echo 04/29/19:  Summary    1. The left ventricle is normal in size with normal wall thickness.    2. The left ventricular systolic function is normal, LVEF is visually  estimated at 55%.    3. The right ventricle is upper normal in size, with normal systolic  function.          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  B: Lymph node, pelvic, core needle biopsy  - Metastatic melanoma  - See comment  ??     FNA R femur 07/19/14:  : Bone, right femur, core biopsy with touch imprints  - No primary or metastatic malignancy identified  - Bone with focal osteocyte  dropout, suggestive of necrosis  - Bone marrow with trilineage hematopoiesis and focal effacement by chronic inflammation (see comment)    B: Bone, right femur, fine needle aspiration  - Few atypical cells present on direct smears (see comment)  - Scant fragments of edematous stromal tissue in cell block  - Bone marrow with trilineage hematopoiesis    Diagnosis:  Lymph nodes, right cervical, levels 2-4, removal and partial parotidectomy  -Metastatic melanoma involving 1 out of 20 lymph nodes, with the largest  diameter measuring 10 mm (1.0 cm) and no evidence of extracapsular extension  (1/20)  -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

## 2020-03-22 NOTE — Unmapped (Signed)
See me back in 6 weeks w/labs.    For appointments & questions Monday through Friday 8 AM???5 PM     Please call [phone] or Toll free [phone]    For urgent clinical needs on Nights, Weekends or Holidays  Call [phone] and ask for the oncologist on call.     For appointment changes please contact during normal business hours.     Please visit [email or web address], a resource created just for family members and caregivers.  This website lists support services, how and where to ask for help. It has tools to assist you as you help [place] care for your loved one.    [facility]  [address]  [place], [place] [address]  [email or web address]

## 2020-03-22 NOTE — Unmapped (Signed)
SiteLast TxDose  ZO:[id number] Neck: 07/09/2013: 4,800/4,800 cGy  AV:[id number] Brai: 01/20/2020: 3,000/3,000 cGy    03/22/2020      Subjective/Assessment/Recommendations: pt is here for follow up    1. Fatigue:   2. Pain: headaches, nausea, dizziness, balance issues if he stands too fast  3. Elimination: no issues  4. Prescription Needs: none  5. Psychosocial:   6. Other: CT and MRI

## 2020-03-22 NOTE — Unmapped (Signed)
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  Hecompleted WBRT to 30 Gy on 01/20/20 and presents today for followup evaluation.    DURATION SINCE COMPLETION OF RADIOTHERAPY:  2 months    ASSESSMENT:  Disease Status: Stable disease intracranially, possible progression in psoas extracranially with other areas stable--MRI brain 03/15/20 with overall stable innumerable punctate lesions, although several have interval microhemorrhage; CT chest today NED; MRI abd/pelvis today with stable peritoneal implants and new lesion in the left psoas, metastasis vs abscess    RECOMMENDATIONS:  1. FOLLOW-UP: 1 month with MRI brain (already scheduled)  2. Steroids: Continue dexamethasone 4mg  daily x 1 more week, then decrease to 2mg  daily x 1 week, then 1mg  daily x 1 week, then stop  3. Encouraged increasing hydration and PO intake in general to help with lightheadedness      INTERVAL HISTORY:    Since completing WBRT, [name] was hospitalized 01/18/20-02/02/20--hospitalized for management of ventral hernia.    He notes feeling very drained after WBRT, more than any other treatment he's had, and this is slowly improving.  Continues on BRAF/MEK inhibitor.    He does not the following symptoms and was started on dexamethasone 03/16/20 by Dr. Nedra Hai:  -Scalp tenderness along both sides of head from front to back   -Stable headaches  -Taste changes started ~2-3 weeks ago, metallic/fruity taste; hasn't been drinking/eating as well because of this  -Nausea/gagging better with steroids started ~1 week ago  -Lightheaded, especially when standing up quickly    Scans done last week and today as noted below.      REVIEW OF SYSTEMS:  A comprehensive review of 10 systems was negative except for pertinent positives noted in HPI.    PAST MEDICAL HISTORY/FAMILY HISTORY/SOCIAL HISTORY:  Reviewed in EPIC    ALLERGIES/MEDICATIONS:  Reviewed in EPIC    PHYSICAL EXAM:  Vital Signs for this encounter:   There were no vitals taken for this visit.  Pain:  Pain Evaluation:                                   Pain Score (0 - 10):                          not quantified but minimal  Pain Location:                                      Karnofsky/Lansky Performance Status: 80, Normal activity with effort; some signs or symptoms of disease (ECOG equivalent 1)  General:   No acute distress, alert and oriented X 4   HEENT:  Normocephalic and atraumatic. Sclerae anicteric. Moist mucous membranes.  Neck/lymphatics:  Supple.  Respiratory:  Breathing is non-labored.  Extremities:  No cyanosis or edema.  Neuro:   Alert and oriented  x4. CN II-XII grossly intact. Normal gait.       RADIOLOGY:    CT Chest W Contrast    Result Date: 03/22/2020  EXAM: CT CHEST W CONTRAST DATE: 03/22/2020 11:11 AM ACCESSION: [id number] UN DICTATED: 03/22/2020 11:47 AM INTERPRETATION LOCATION: [facility]     CLINICAL INDICATION: 45 years old Male with melanoma  - C43.9 - Malignant melanoma, unspecified site (CMS - HCC)      COMPARISON: 11/03/2019     TECHNIQUE: A spiral CT scan was obtained without IV contrast from the thoracic inlet through the hemidiaphragms. Images were reconstructed in the axial plane.  Coronal and sagittal reformatted images of the chest were also provided for further evaluation of the lung parenchyma.     FINDINGS:     Clear central tracheobronchial tree.     No airspace consolidation. No suspicious pulmonary nodules/masses. No pleural effusion.     Thyroid gland is not visualized likely thyroidectomy. Normal heart size. Normal caliber thoracic aorta. Coronary artery calcifications. No adenopathy. Small hiatal hernia.  Please refer to concurrent abdominal and pelvic CT report for findings below the hemidiaphragms.     Stable sebaceous cyst in the posterior chest wall.     No suspicious osseous lesion.         No thoracic metastatic disease.         MRI Brain W Wo Contrast    Result Date: 03/16/2020  EXAM: Magnetic resonance imaging, brain, without and with contrast material. DATE: 03/15/2020 4:45 PM ACCESSION: [id number] UN DICTATED: 03/16/2020 8:09 AM INTERPRETATION LOCATION: [facility]     CLINICAL INDICATION: 45 years old Male with hx of melanoma brain mets; increasing headaches/nausea ; Brain mass or lesion  - C71.9 - Malignant neoplasm of brain, unspecified location (CMS - HCC)      COMPARISON: MRI brain 11/25/2019, 08/05/2019, 04/29/19     TECHNIQUE: Multiplanar, multisequence MR imaging of the brain was performed without and with I.V. contrast.     FINDINGS:  Innumerable punctate T1 bright foci scattered throughout the supra/infratentorial parenchyma predominantly centered at the gray-white matter junction and with sparing the brainstem. The largest focus measures 0.3 cm and is centered along the periphery of right head of the caudate. A number of these foci exhibit enhancement. No definitive new or enlarging lesions (given difference in magnet strength). However, some of the lesions demonstrate interval internal microhemorrhage (marked on series 10). Ventricles are normal in size. There is no midline shift. No extra-axial fluid collection. No evidence of acute intracranial hemorrhage. No diffusion weighted signal abnormality to suggest acute infarct.     Linear segment of enhancement within the anterior apex of the right parietal bone, similar in size and appearance from the prior (103:121)     Persistent complete opacification of the left maxillary sinus and a large mucosal retention cyst in the right. Trace left greater than right mastoid effusions.         Innumerable punctate T1 bright foci scattered throughout the cerebral and cerebellar hemispheres, some of these foci exhibit mild enhancement, again compatible with micrometastases given history of melanoma. No definitive new or enlarging lesions; however, several lesions demonstrate interval microhemorrhage, as compared to 11/25/2019.     Similar size and appearance of an enhancing osseous lesion within the right parietal bone, indeterminate, but may represent an osseous metastasis. Recommend continued attention on follow-up.    MRI Abdomen Pelvis W Wo Contrast    Result Date: 03/22/2020  EXAM: MRI ABDOMEN PELVIS W WO CONTRAST DATE: 03/22/2020  10:55 AM ACCESSION: [id number] UN DICTATED: 03/22/2020 2:09 PM INTERPRETATION LOCATION: [facility]     CLINICAL INDICATION: melanoma  - C43.9 - Malignant melanoma, unspecified site (CMS - HCC)      COMPARISON: CT abdomen/pelvis 01/23/2020. MRI abdomen 11/25/2019     TECHNIQUE: MRI of the abdomen and pelvis was performed with and without IV contrast material. Multisequence, multiplanar images were obtained.       FINDINGS:     LOWER CHEST: Please see same-day CT chest for intrathoracic details.     HEPATOBILIARY: In hepatic segment 8, there are areas of increased T1 signal intensity measuring up to 1.1 cm (series 18, images 25, 34, 43) with capsular retraction. There is no associated restricted diffusion in these areas. There is no definite T2 correlate.     Lobulated appearance of the gallbladder is again identified.. No evidence of biliary obstruction.     PANCREAS: Pancreatic divisum. The main pancreatic duct is nondilated.     SPLEEN: Mildly enlarged measuring 14.0 cm. No focal lesion is identified     ADRENAL GLANDS: No large measurable lesion is identified.     KIDNEYS/URETERS: Symmetric contrast enhancement. No renal collecting system dilatation. No enhancing renal lesion is identified     BLADDER: Underdistended and demonstrates circumferential wall thickening.     REPRODUCTIVE ORGANS: The prostate gland is mildly enlarged measuring 5.8 cm in diameter. The seminal vesicles are symmetric and appear grossly unremarkable.     GI TRACT: Tiny hiatal hernia. No dilated loops of bowel. No acute inflammatory process. The appendix unremarkable.     PERITONEUM/RETROPERITONEUM AND MESENTERY: Similar appearance of metastatic implants (series 16, image 54, series 20, images 39 and 43.     VASCULATURE: Normal caliber of the abdominal aorta. 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 The remaining portal venous system is patent. The origins of the celiac axis, SMA and renal arteries are patent.     LYMPH NODES: Persistent enlarged left inguinal lymph nodes. There is a prominent left external iliac lymph node measuring 1.1 cm (series 8, image 7). Unchanged T1 hyperintense subcentimeter prominent lymph nodes in the abdomen and pelvis.     BONES/SOFT TISSUES: There are postsurgical changes along the midline anterior abdominal wall. There is a rim-enhancing fluid collection measuring 7.5 x 4.2 cm (series 22, image 90). This collection is located approximately 4.5 cm beneath the skin surface. This fluid collection is located primarily along the left para midline, along the surgical incision and abuts the left rectus abdominis muscle.     There is a fat-containing left inguinal hernia that contains a loculated cysts/fluid.     There is a 1.1 cm T2 hyperintense lesion with enhancement peripherally located within the left psoas muscle (series 15, image 29)     There is a nonenhancing lesion within the left aspect of the L2 vertebral body (series 21, image 69). No enhancing osseous lesion is identified.         1.There are postsurgical changes along the midline anterior wall. There is a 7.5 cm rim-enhancing fluid collection noted along the left aspect of the surgical incision site that abuts the left rectus abdominis muscle located approximately 4.5 cm beneath the skin surface. The sterility of this collection is unknown. Clinical condition is recommended but hematoma with surrounding chronic inflammatory changes is favored 2.Unchanged size and number of metastatic peritoneal implants. No new lesions are identified. 3.New peripherally rim-enhancing lesion in the left psoas muscle. This is  worrisome for an metastasis versus less likely a tiny abscess. Clinical correlation is recommended. Short-term follow-up is recommended. Alternatively further assessment with PET/CT could be considered. 4.Chronic and incidental findings as detailed above.                 Labs:    Lab Results   Component Value Date    WBC 6.2 03/01/2020    HGB 11.7 (L) 03/01/2020    HCT 39.2 (L) 03/01/2020    PLT 297 03/01/2020     Lab Results   Component Value Date    NA 141 03/01/2020    K 3.8 03/01/2020    CL 106 03/01/2020    CREATININE 0.96 03/01/2020    BUN 12 03/01/2020    CO2 26.0 03/01/2020     TSH   Date Value Ref Range Status   01/03/2020 26.788 (H) 0.550 - 4.780 uIU/mL Final   08/30/2019 1.748 0.550 - 4.780 uIU/mL Final   01/28/2019 2.684 0.600 - 3.300 uIU/mL Final   12/07/2018 3.563 (H) 0.600 - 3.300 uIU/mL Final   11/24/2018 5.062 (H) 0.600 - 3.300 uIU/mL Final   10/28/2018 4.133 (H) 0.600 - 3.300 uIU/mL Final   06/22/2018 0.352 (L) 0.600 - 3.300 uIU/mL Final   05/13/2018 0.143 (L) 0.600 - 3.300 uIU/mL Final   03/12/2018 0.020 (L) 0.600 - 3.300 uIU/mL Final   02/12/2018 0.033 (L) 0.600 - 3.300 uIU/mL Final   12/17/2017 0.071 (L) 0.600 - 3.300 uIU/mL Final   06/11/2017 0.084 (L) 0.600 - 3.300 uIU/mL Final   02/27/2017 0.789 0.600 - 3.300 uIU/mL Final   07/03/2016 23.100 (H) 0.600 - 3.300 uIU/mL Final   01/26/2016 13.240 (H) 0.600 - 3.300 uIU/mL Final   12/04/2015 0.060 (L) 0.600 - 3.300 uIU/mL Final   06/05/2015 1.120 0.600 - 3.300 uIU/mL Final   12/12/2014 0.970 0.600 - 3.300 uIU/mL Final 10/13/2014 0.040 (L) 0.600 - 3.300 uIU/mL Final   06/30/2014 0.240 (L) 0.600 - 3.300 uIU/mL Final   10/04/2013 0.11 (L) 0.60 - 3.30 u[iU]/mL Final   06/28/2013 0.28 (L) 0.60 - 3.30 u[iU]/mL Final   04/27/2013 0.09 (L) 0.60 - 3.30 u[iU]/mL Final   12/21/2012 1.99 0.60 - 3.30 u[iU]/mL Final   11/18/2012 0.37 (L) 0.60 - 3.30 u[iU]/mL Final   09/22/2012 1.55 0.60 - 3.30 u[iU]/mL Final   05/26/2012 17.07 (H) 0.60 - 3.30 u[iU]/mL Final   03/27/2012 29.67 (H) 0.60 - 3.30 MICROIU/ML Final   03/25/2012 88.42 (H) 0.60 - 3.30 MICROIU/ML Final   03/23/2012 16.53 (H) 0.60 - 3.30 MICROIU/ML Final   01/30/2012 21.81 (H) 0.60 - 3.30 MICROIU/ML Final   11/07/2011 7.63 (H) 0.60 - 3.30 MICROIU/ML Final   08/15/2011 9.10 (H) 0.60 - 3.30 MICROIU/ML Final   07/25/2011 21.79 (H) 0.60 - 3.30 MICROIU/ML Final   07/16/2011 19.60 (H) 0.60 - 3.30 MICROIU/ML Final   05/02/2011 0.88 0.60 - 3.30 MICROIU/ML Final   03/28/2011 0.55 (L) 0.60 - 3.30 MICROIU/ML Final   12/13/2010 0.89 0.60 - 3.30 MICROIU/ML Final         I personally reviewed all relevant imaging and data associated with this encounter.    Mina Marble, MD, PhD  Department of Radiation Oncology  [facility]  [id number]  [place], [place] [address]  O: [phone]

## 2020-03-25 NOTE — Unmapped (Signed)
Otolaryngology Clinic Note    Kenneth Fitzgerald is a 45 y.o. male is seen in consultation at the request of Angus [facility]ms evaluation of mucus retention cysts.       History of Present Illness:     The patient is a 45 y.o. male who  has a past medical history of Cancer (CMS-HCC), Disease of thyroid gland, Hypothyroidism, and Skin cancer. who presents for the evaluation of chronic sinonasal symptoms noted on brain MRI ordered by his Oncologists. 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 He endorses intermittent anterior and posterior clear drainage, as well as infrequent facial pressure. He notes changes in sense of smell and taste secondary to brain radiation, but notes good smell/taste prior to XRT. He denies past sinus surgeries. He denies allergy testing or immunotherapy. He currently uses Afrin PRN. He takes Allegra PRN. He does not use topical steroid sprays or saline sprays/rinses. He is not frequently taking antibiotics for sinus infections. His most recent surgery for his cancer was 3-4 years ago.     Patients medical records were personally reviewed.      A 12 point review of systems was negative except as indicated.  The patient denies fevers, chills, shortness of breath, chest pain, nausea, vomiting, diarrhea, inability to lie flat, dysphagia, odynophagia, hemoptysis, hematemesis, changes in vision, changes in voice quality, otalgia, otorrhea, vertiginous symptoms, focal deficits, or other concerning symptoms.    Past Medical History     has a past medical history of Cancer (CMS-HCC), Disease of thyroid gland, Hypothyroidism, and Skin cancer.    Past Surgical History     has a past surgical history that includes pr removal nodes, neck,cerv cmplt (Left, 12/07/2012); pr removal nodes, neck,cerv mod rad (Right, 03/29/2013); pr lap, ventral hernia repair,reducible (N/A, 04/07/2014); pr repair recurr incis hernia,reduc (N/A, 01/03/2015); pr implant mesh hernia repair/debridement closure (N/A, 01/03/2015); pr repair recurr incis hernia,strang (N/A, 06/21/2016); pr colonoscopy w/biopsy single/multiple (N/A, 01/09/2018); pr sigmoidoscopy,fine needl bx,us guided (N/A, 01/09/2018); pr colonoscopy w/biopsy single/multiple (N/A, 08/24/2019); pr colsc flx with directed submucosal njx any sbst (N/A, 08/24/2019); pr exploratory of abdomen (N/A, 01/24/2020); pr freeing bowel adhesion,enterolysis (N/A, 01/24/2020); pr repair recurr incis hernia,strang (N/A, 01/24/2020); and pr negative pressure wound therapy dme >50 sq cm (N/A, 01/24/2020).    Current Medications    Current Outpatient Medications   Medication Sig Dispense Refill   ??? acetaminophen (TYLENOL) 500 MG tablet Take 2 tablets (1,000 mg total) by mouth every eight (8) hours as needed for pain. 30 tablet 0   ??? aluminum-magnesium hydroxide-simethicone 200-200-20 mg/5 mL Susp 80 mL, diphenhydrAMINE 12.5 mg/5 mL Liqd 200 mg, nystatin 100,000 unit/mL Susp 8,000,000 Units, distilled water Liqd 80 mL, lidocaine 2% viscous 2 % Soln 80 mL Take 5 mL by mouth every six (6) hours as needed. Swish, gargle, spit as needed.  May be swallowed if esophageal involvement. 100 mL 0   ??? binimetinib ([name]) 15 mg tablet Take 3 tablets (45 mg total) by mouth Two (2) times a day. 180 tablet 11   ??? blood sugar diagnostic Strp Dispense 100 blood glucose test strips, ok to sub any brand preferred by insurance/patient, use 3x/day; dispense whatever brand matches with meter. 100 strip 12   ??? blood-glucose meter kit Use as instructed; dispense 1 meter, whatever is preferred by insurance 1 each 1   ??? calcitrioL (ROCALTROL) 0.25 MCG capsule Take 1 capsule (0.25 mcg total) by mouth daily.  30 capsule 11   ??? calcium carbonate 650 mg calcium (1,625 mg) tablet Take 1 tablet by mouth Three (3) times a day with a meal.     ??? dexAMETHasone (DECADRON) 2 MG tablet Take 1 tablet (2 mg total) by mouth 2 (two) times a day with meals. 60 tablet 0   ??? DULoxetine (CYMBALTA) 30 MG capsule TAKE 1 CAPSULE (30 MG TOTAL) BY MOUTH TWO (2) TIMES A DAY. 180 capsule 2   ??? encorafenib (BRAFTOVI) 75 mg capsule Take 6 capsules (450 mg total) by mouth daily. 180 capsule 11   ??? fexofenadine (ALLEGRA ALLERGY) 180 MG tablet Take 180 mg by mouth daily as needed (allergies).      ??? folic acid (FOLVITE) 1 MG tablet Take 1 tablet (1 mg total) by mouth daily. 30 tablet 11   ??? ibuprofen (ADVIL,MOTRIN) 200 MG tablet Take 600 mg by mouth daily as needed for pain.     ??? lancets Misc Dispense 100 lancets, ok to sub any brand preferred by insurance/patient, use 3x/day 100 each 12   ??? levothyroxine (SYNTHROID) 125 MCG tablet Take by mouth daily. Take 2 tablets (250 mcg total) every Sat and Sun. Take 3 tablets (375 mcg total) every Mon, Tue, Wed, Thur and Fri.     ??? melatonin 3 mg Tab Take 1 tablet (3 mg total) by mouth every evening. 90 tablet 1   ??? memantine (NAMENDA) 10 MG tablet Take 10 mg by mouth daily.     ??? metFORMIN (GLUCOPHAGE) 500 MG tablet Take 1 tablet (500 mg total) by mouth 2 (two) times a day with meals. (Patient taking differently: Take 500 mg by mouth daily. ) 180 tablet 3   ??? ondansetron (ZOFRAN) 8 MG tablet Take 1 tablet (8 mg total) by mouth every eight (8) hours as needed for nausea. 60 tablet 2   ??? sulfaSALAzine (AZULFIDINE) 500 mg tablet Take 2 tablets (1000mg ) by mouth twice daily for 7 days, then take 4 tablets (2000mg ) twice daily thereafter. 240 tablet 11   ??? fluticasone propionate (FLONASE) 50 mcg/actuation nasal spray 2 sprays into each nostril two (2) times a day. 16 g 6     No current facility-administered medications for this visit.       Allergies    Allergies   Allergen Reactions   ??? Compazine [Prochlorperazine] Itching   ??? Coconut Nausea And Vomiting   ??? Multihance [Gadobenate Dimeglumine] Other (See Comments)     Patient sneezed immediately after administration of contrast.       Family History    Negative for bleeding disorders or free bleeding. family history includes Arrhythmia in his mother; Colon cancer in his paternal grandmother; Coronary artery disease in his father; Diabetes in his father; Hypertension in his father; Hyperthyroidism in his mother; Osteoporosis in his mother; Prostate cancer in his father; Squamous cell carcinoma in his mother.    Social History:     reports that he has never smoked. He has never used smokeless tobacco.   reports current alcohol use.   reports no history of drug use.    Review of Systems    A 12 system review of systems was performed and is negative other than that noted in the history of present illness.    Vital Signs  Blood pressure 169/101, pulse 79, temperature 36.4 ??C (97.5 ??F), height 188 cm (6' 2), SpO2 96 %.    Rhinosinusitis Disability Index  Because of my problem I feel stressed in relationships with  friends and family.: 0  Because of my problem I feel confused.: 0  Because of my problem I have difficulty paying attention.: 0  Because of my difficulty I avoid being around people.: 0  Because of my problem I am frequently angry.: 0  Because of my problem I do not like to socialize.: 0  Because of my problem I frequently feel tense.: 0  Because of my problem I frequently feel irritable.: 0  Because of my problem I am depressed.: 0  My problem places stress on my relationship with members of my family or friends.: 0  Because of my problem I feel handicapped.: 0  Because of my problem I feel restricted in performace of my routine daily activities.: 0  Because of my problem I restrict my recreational activities.: 2  Because of my problem I feel frustrated.: 0  Because of my problem I feel fatigued.: 2  Because of my problem I avoid traveling.: 0  Because of my problem I miss work or social activities.: 1  My outlook on the world is affected by my problem.: 0  Because of my problem I find it difficult to focus my attention away from my problem and on other things.: 0  The pain of pressure in my face makes it difficult for me to concentrate.: 0  The pain in my eyes makes it difficult for me to read.: 0  I have difficulty stooping over to lift objects due to face pressure.: 0  Because of my problem I have difficulty with strenuous yard work and housework.: 1  Straining increases or worsend my problem.: 0  I am inconvenienced by my chronic runny nose.: 0  Food does not taste good because of my change in smell.: 3  My frequent sniffing is irritating to my friends and family.: 0  Because of my problem I do not sleep well.: 0  I have difficulty with exertion due to my nasal obstruction.: 0  My sexual activity is affected by my problem.: 0  Total Score: 9      Physical Exam    General: Well-developed, well-nourished. Appropriate, comfortable, and in no apparent distress.  Head/Face: On external examination there is no obvious asymmetry or scars. On palpation there is no tenderness over maxillary sinuses or masses within the salivary glands. Cranial nerves V and VII are intact through all distributions.  Eyes: PERRL, EOMI, the conjunctiva are not injected and sclera is non-icteric.  Ears: On external exam, there is no obvious lesions or asymmetry. The EACs are bilaterally with mild cerumen. The TMs are in the neutral position and are mobile to pneumatic otoscopy bilaterally. There are no middle ear masses or fluid noted. Hearing is grossly intact bilaterally.  Nose: On external exam there are neither lesions nor asymmetry of the nasal tip/ dorsum. On anterior rhinoscopy, visualization posteriorly is limited on anterior examination. For this reason, to adequately evaluate posteriorly for masses, polypoid disease and/or signs of infections, nasal endoscopy is indicated (see procedure below).  Oral cavity/oropharynx: The mucosa of the lips, gums, hard and soft palate, posterior pharyngeal wall, tongue, floor of mouth, and buccal region are without masses or lesions and are normally hydrated. Good dentition. Tongue protrudes midline. Tonsils are normal appearing. Supraglottis not visualized due to gag reflex.  Neck: There is no asymmetry or masses. Trachea is midline. There is no enlargement of the thyroid or palpable thyroid nodules.   Lymphatics: There is no palpable lymphadenopathy along the jugulodiagastric, submental, or posterior cervical  chains.  Chest: No audible wheeze, unlabored respirations.  Cardiovascular: Regular rate.  GI: Nondistended.  Neurologic: Cranial nerve???s II-XII are grossly intact. Exam is non-focal.  Extremities: No cyanosis, clubbing or edema.    Procedures:  Diagnostic Bilateral Nasal Endoscopy (CPT [id number])    NOTE: Nasal endoscopy is performed for the sinuses only, and not for examination of the skull base, septum or inferior turbinates, nor is it related to any previously performed septoplasty or inferior turbinate surgery or skull base surgery.     Surgeon: Egbert Garibaldi, MD  Anesthesia: none  Procedure Detail:  As a result of inability to visualize the intranasal anatomy, and after discussion of the potential risks related to the procedure (primarily bleeding), a endoscope is used to examine the left and right sinonasal cavities, including the interior of the nasal cavity and the middle and superior meatus, the turbinates, and the spheno-ethmoid recess. All these areas were inspected.    Findings:    Examination on the left reveals an intact nasal septum with no associated masses, lesions, or friable mucosa. The left middle meatus, sphenoethmoidal recess, and skull base are clear with no evidence of purulence, polyposis, or polypoid edema.     Examination on the right reveals an intact nasal septum with no associated masses, lesions, or friable mucosa. The right middle meatus, sphenoethmoidal recess, and skull base are clear with no evidence of purulence, polyposis, or polypoid edema. Mild septal deviation.       Oretha Ellis Nasal Endoscopy Score: [employer] is used to assess the degree of inflammation of the sinonasal structures, including the middle and superior turbinates, the ethmoid sinuses, maxillary sinuses, frontal sinuses, and sphenoid sinuses.  In the presence of previous surgery, some or all of these structures may be absent.    Left        ?? Polyps:  Absent (0)   ?? Edema:   None (0)  ?? Discharge:  Clear, Thin (1)    ?? Scarring:  Absent (0)   ?? Crusting:  None (0)      Total Left:  1     Right         ?? Polyps:  Absent (0)   ?? Edema:  Absent (0)   ?? Discharge: Clear, Thin (1)    ?? Scarring:  Absent (0)   ?? Crusting:  None (0)      Total Right:   1      Labs and Diagnostic Tests  MRI Brain 03/13/2020:       FINDINGS:    Innumerable punctate T1 bright foci scattered throughout the supra/infratentorial parenchyma predominantly centered at the gray-white matter junction and with sparing the brainstem. The largest focus measures 0.3 cm and is centered along the periphery of right head of the caudate. A number of these foci exhibit enhancement. No definitive new or enlarging lesions (given difference in magnet strength). However, some of the lesions demonstrate interval internal microhemorrhage (marked on series 10). Ventricles are normal in size. There is no midline shift. No extra-axial fluid collection. No evidence of acute intracranial hemorrhage. No diffusion weighted signal abnormality to suggest acute infarct.   ??  Linear segment of enhancement within the anterior apex of the right parietal bone, similar in size and appearance from the prior (103:121)  ??  Persistent complete opacification of the left maxillary sinus and a large mucosal retention cyst in the right. Trace left greater than right mastoid effusions.  ??  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 No definitive new or enlarging lesions; however, several lesions demonstrate interval microhemorrhage, as compared to 11/25/2019.  ??  Similar size and appearance of an enhancing osseous lesion within the right parietal bone, indeterminate, but may represent an osseous metastasis. Recommend continued attention on follow-up.    Assessment:  The patient is a 45 y.o. male who  has a past medical history of Cancer (CMS-HCC), Disease of thyroid gland, Hypothyroidism, and Skin cancer. who presents for the evaluation of:  Allergic Rhinitis  Posterior Nasal Drainage  Headache/Facial Pain  Diminished Sense of Smell   Mucus Retention Cysts    Recommendations:  1. The patient has evidence of mucus retention cysts (L>R) noted on radiographic imaging. However, he is asymptomatic from that standpoint, and I do not recommend surgical intervention at this time. Continue to monitor with radiographic imaging scheduled by Oncology.     2. Mr. Aase has minimal inflammation in the nasal cavity noted on nasal endoscopy exam today in clinic. We have extensively discussed treatment options. The patient will start on a nasal hygiene regimen which includes nasal saline irrigations and nasal steroid sprays daily. We have discussed the proper positioning for optimal use of the nasal steroid. I will also prescribe him the nasal steroid spray. Allegra PRN.     Follow-up 6-9 months. He knows to reach out to my clinic if he starts to experience severe facial pain.     The patient's physical examination findings including flexible fiberoptic nasopharyngolaryngosocpy/sinonasal endoscopy were thoroughly discussed.    The patient voiced complete understanding of plan as detailed above and is in full agreement.    Scribe's Attestation: Junius Finner, MD obtained and performed the history, physical exam and medical decision making elements that were entered into the chart. Signed by Frazier Butt, Scribe, on March 30, 2020 10:13 AM    ----------------------------------------------------------------------------------------------------------------------  March 30, 2020 2:38 PM. Documentation assistance provided by the Scribe. I was present during the time the encounter was recorded. The information recorded by the Scribe was done at my direction and has been reviewed and validated by me.  ----------------------------------------------------------------------------------------------------------------------

## 2020-03-27 ENCOUNTER — Encounter: Admit: 2020-03-27 | Discharge: 2020-03-28 | Payer: PRIVATE HEALTH INSURANCE | Attending: Clinical | Primary: Clinical

## 2020-03-27 DIAGNOSIS — F331 Major depressive disorder, recurrent, moderate: Principal | ICD-10-CM

## 2020-03-27 DIAGNOSIS — C801 Malignant (primary) neoplasm, unspecified: Principal | ICD-10-CM

## 2020-03-27 DIAGNOSIS — F411 Generalized anxiety disorder: Principal | ICD-10-CM

## 2020-03-27 MED FILL — MEKTOVI 15 MG TABLET: ORAL | 30 days supply | Qty: 180 | Fill #5

## 2020-03-27 MED FILL — BRAFTOVI 75 MG CAPSULE: ORAL | 30 days supply | Qty: 180 | Fill #5

## 2020-03-27 NOTE — Unmapped (Incomplete)
[facility]  Psychiatry  Psychotherapy Note - Telehealth via??Video  ??  Service Date: March 14,??2022  Service:??50??minutes of??psychotherapy via??video??session  Time with Patient:??50??minutes  ??  Encounter Description:??This encounter was conducted from provider's office via EPIC video session due to COVID-19 pandemic. Kenneth Fitzgerald was located in his home. Rationale for??video session??is need to social distance. See Plan for telemedicine consent/disclaimer.   ??  Encounter Description/Consent:??  Kenneth Fitzgerald visit was completed through telehealth encounter (video).   ????  This patient encounter is appropriate and reasonable under the circumstances. The patient has been advised of the potential risks and limitations of this mode of treatment (including, but not limited to, the absence of in-person examination) and has agreed to be treated in a remote fashion in spite of them. Any and all of the patient's/patient's family's questions on this issue have been answered.     The patient was physically located in [place] in which I am permitted to provide care. The patient??understood that??he may incur co-pays and cost sharing, and agreed to the telemedicine visit. The visit was??reasonable and appropriate under the circumstances given the patient's presentation at the time.   ????  Time Spent: 50??minutes  ??  Assessment:  Kenneth Fitzgerald a 44yo??male??with metastatic melanoma, h/o thyroid cancer, recent portal vein thrombosis, who I see in therapy to address depression and enhance coping with??multiple??stressors.?? We met today for session??via telehealth (video).????Kenneth Fitzgerald described his reaction to learning that his life expectancy at this point is about a year, and he has been understandably distressed.  At the same time, he elucidated priorities in his life, and detailed plans and a renewed energy to meet them.  No emergent psych concerns. ??I offered psychotherapy, support,??and recommendations. ??Will see next in two weeks.  ??  Risk Assessment:  A suicide and violence risk assessment was performed as part of this evaluation. There patient is deemed to be at chronic elevated risk??for self-harm/suicide given the following factors: current diagnosis of depression, suicidal ideation or threats without a plan and past diagnosis of depression. There patient is deemed to be at chronic elevated risk??for violence given the following factors: male gender. These risk factors are mitigated by the following factors: lack of active SI/HI. There is no acute risk for suicide or violence at this time. The patient was educated about relevant modifiable risk factors including following recommendations for treatment of psychiatric illness and abstaining from substance abuse.??While future psychiatric events cannot be accurately predicted, the patient does not currently require acute inpatient psychiatric care and does not currently meet [facility] involuntary commitment criteria.??????????????????  ??  Plan:  1.??I will continue to see pt for psychotherapy. ??Next session??is??scheduled??to take place in two weeks.  2. Pt??has??information on available [facility] resources.??Pt is working with [name], [name]. ??I have reached out to [name] to explore financial resources.  3.??Pt??has been followed by Dr. Lawernce Keas of Psychiatry.  4. Pt has??my contact information and knows??to reach me as needed.  ??  Subjective:??  Kenneth Fitzgerald??presented as alert and oriented.????Mood was a a bit down, but he was engaged throughout.    Kenneth Fitzgerald described his recent meeting with Dr. Nedra Hai, and shared that his life expectancy at this point is about one year.  While not completely surprising to Kenneth Fitzgerald, he noted the experience of thinking about life expectancy in different terms than previously.  He noted being understandably upset, but I do not believe overwhelmed. No emergent concerns noted.  In some ways the conversation seems  to have sharpened his resolve to clarify priorities in his life and take steps to achieve them.  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 My sense is that Kenneth Fitzgerald has renewed energy to accomplish these aims.    Pt??continues to take Cymbalta as prescribed.  ??  I provided??Kenneth Fitzgerald with??support and understanding, and we??discussed and processed??his??thoughts and feelings??in depth.????We problem-solved??cognitive and behavioral??approaches to??address??and??tolerate??distress and depressive symptoms, to enhance his??adaptive coping, and to achieve his stated goals. ??Kenneth Fitzgerald??was engaged and receptive.  ??  Mental Status Exam:  Appearance:  ?? ??Appears stated age   Motor: ?? ??No abnormal movements   Speech/Language:  ?? ??Normal rate, volume, tone, fluency   Mood: ?? ??okay   Affect: ?? ??engaged; pleasant; sw depressed mood initially   Thought process: ?? ??Logical, linear, clear, coherent, goal directed   Thought content: ?? ?? ??Denies SI/HI??or thoughts of??self-harm   Perceptual disturbances: ?? ?? ??Behavior not concerning for response to internal stimuli  ??   Orientation: ?? ??Oriented to person, place, time, and general circumstances   Attention: ?? ??Able to fully attend without fluctuations in consciousness   Concentration: ?? ??Able to fully concentrate and attend   Memory: ?? ??Immediate, short-term, long-term, and recall grossly intact   ??Fund of knowledge:  ?? ??Consistent with level of education and development   Insight: ?? ?? ??Intact   Judgment:  ?? ??Intact   Impulse Control: ?? ??Intact   ??  Diagnosis: Recurrent major depressive disorder;  Anxiety associated with cancer diagnosis  ??  Kenneth Fitzgerald. Kenneth Kuhnle PhD  March 14,??2022 major depressive disorder;  Anxiety associated with cancer diagnosis  ??  Kenneth Fitzgerald. Kipton Skillen PhD  March 14,??2022

## 2020-03-28 NOTE — Unmapped (Signed)
Outpatient Oncology Social Work  Follow Up     At request of [name], [place] called patient to discuss applying for SSDI.  Patient did not answer and his VM was full.  Sw then sent patient a message via his MyChart, asking him to call this Sw.    [name]   Oncology Outpatient Social Worker  [phone]

## 2020-03-30 ENCOUNTER — Encounter
Admit: 2020-03-30 | Discharge: 2020-03-31 | Payer: PRIVATE HEALTH INSURANCE | Attending: Student in an Organized Health Care Education/Training Program | Primary: Student in an Organized Health Care Education/Training Program

## 2020-03-30 DIAGNOSIS — J310 Chronic rhinitis: Principal | ICD-10-CM

## 2020-03-30 DIAGNOSIS — R930 Abnormal findings on diagnostic imaging of skull and head, not elsewhere classified: Principal | ICD-10-CM

## 2020-03-30 MED ORDER — FLUTICASONE PROPIONATE 50 MCG/ACTUATION NASAL SPRAY,SUSPENSION
Freq: Two times a day (BID) | NASAL | 6 refills | 0.00000 days | Status: CP
Start: 2020-03-30 — End: 2021-03-30

## 2020-04-07 NOTE — Unmapped (Signed)
Outpatient Oncology Social Work  Follow Up     Sw called patient to discuss applying for SSDI.  Patient is currently still working; his income is ~$2,000/month.  He is considering signing up for SSDI, but understands that they do not start payment until six months after his date of determination.  Sw explained that when he signs up on ssa.gov he should have them flag his case as compassionate allowance to help expedite the application process.      Patient shared that he is struggling some financially as he used up all of his savings when he had to spend 15 days in the hospital earlier in the year and couldn't work.  Sw explained about the [facility] CPAF gas card program.  Sw completed CPAF gas card program application and submitted to [name], CCSP Patient Assistance Counselor, for approval.      Sw also explained that CPAF may be able to assist with a household bill.  Sw will check with staff to learn if they can make a partial payment on a property tax [facility]) bill.      Sw also explained about [employer] and agreed to email patient an application.  Sw encouraged him to call with any questions while filling this out.      Sw called the patient back later in the day and left a message asking for him to email Sw his property tax bill and CPAF staff will try to make a partial payment on this.    [name]   Oncology Outpatient Social Worker  [phone]

## 2020-04-07 NOTE — Unmapped (Signed)
COMPLEX CASE MANAGEMENT   Brief Note    This patient has been reviewed for Complex Case Management services and is not eligible at this time. To have this patient reevaluated for Complex Case Management please place AMB Referral to Case Management to the Personal Health Advocate Department. Patient no longer attributed to [facility]       Deliah Goody - High Risk Care Coordinator   [facility]  [address], [address] [place], [place] [address]  P: [phone] F: [phone]  [email or web address]

## 2020-04-10 ENCOUNTER — Encounter: Admit: 2020-04-10 | Discharge: 2020-04-11 | Payer: PRIVATE HEALTH INSURANCE | Attending: Clinical | Primary: Clinical

## 2020-04-10 NOTE — Unmapped (Signed)
[id number] [facility]  Psychiatry  Psychotherapy Note - Telehealth via??Video  ??  Service Date: March 28,??2022  Service:??30??minutes of??psychotherapy via??video??session  Time with Patient:??30??minutes  ??  Encounter Description:??This encounter was conducted from provider's office via EPIC video session due to COVID-19 pandemic. Kenneth Fitzgerald was located in his home. Rationale for??video session??is need to social distance. See Plan for telemedicine consent/disclaimer.   ??  Encounter Description/Consent:??  Kenneth Fitzgerald visit was completed through telehealth encounter (video).   ????  This patient encounter is appropriate and reasonable under the circumstances. The patient has been advised of the potential risks and limitations of this mode of treatment (including, but not limited to, the absence of in-person examination) and has agreed to be treated in a remote fashion in spite of them. Any and all of the patient's/patient's family's questions on this issue have been answered.     The patient was physically located in [place] in which I am permitted to provide care. The patient??understood that??he may incur co-pays and cost sharing, and agreed to the telemedicine visit. The visit was??reasonable and appropriate under the circumstances given the patient's presentation at the time.   ????  Time Spent: 30??minutes  ??  Assessment:  Kenneth Fitzgerald a 45yo??male??with metastatic melanoma, h/o thyroid cancer, recent portal vein thrombosis, who I see in therapy to address depression and enhance coping with??multiple??stressors.?? We met today for a 30-minute session??via telehealth (video). Kenneth Fitzgerald reported overall better psychosocial functioning in the time since our last session. He described distress around his prognosis as being more manageable and that he is making progress on stated goals to spend his time meaningfully.  No emergent psych concerns. ??I offered psychotherapy, support,??and recommendations. ??Will see next in two weeks.  ??  Risk Assessment:  A suicide and violence risk assessment was performed as part of this evaluation. There patient is deemed to be at chronic elevated risk??for self-harm/suicide given the following factors: current diagnosis of depression, suicidal ideation or threats without a plan and past diagnosis of depression. There patient is deemed to be at chronic elevated risk??for violence given the following factors: male gender. These risk factors are mitigated by the following factors: lack of active SI/HI. There is no acute risk for suicide or violence at this time. The patient was educated about relevant modifiable risk factors including following recommendations for treatment of psychiatric illness and abstaining from substance abuse.??While future psychiatric events cannot be accurately predicted, the patient does not currently require acute inpatient psychiatric care and does not currently meet [facility] involuntary commitment criteria.??????????????????  ??  Plan:  1.??I will continue to see pt for psychotherapy. ??Next session??is??scheduled??to take place in two weeks.  2. Pt??has??information on available CCSP resources.??Pt is working with [name], [name]. ??I have reached out to [name] to explore financial resources.  3.??Pt??has been followed by Dr. Lawernce Keas of Psychiatry.  4. Pt has??my contact information and knows??to reach me as needed.  ??  Subjective:??  [name]??presented as alert and oriented.????Affect was relatively bright and he was engaged throughout.    Kenneth Fitzgerald reported better overall psychosocial functioning during the time since our last session.  He described absorbing and adapting to the idea that his life expectancy is about a year, and that his emotional distress about this has been less intense and more manageable.  He turned 45 years old yesterday, which understandably triggered some thoughts abouthis mortality, but he was not overwhelmed by the occasion.  No emergent psych concerns noted.  Kenneth Fitzgerald reiterated his top two priorities of: 1) estate and funeral planning, and 2) traveling to various places in the country. Over the last two weeks, he has taken active steps toward each of these aims.    Pt??continues to take Cymbalta as prescribed.  ??  I provided??Kenneth Fitzgerald with??support and understanding, and we??discussed and processed??his??thoughts and feelings??again in some depth.????We problem-solved??and reviewed approaches to??address??and??tolerate??distress and depressive symptoms, to enhance his??adaptive coping, and to achieve his stated goals. ??Kenneth Fitzgerald??was engaged and receptive.  ??  Mental Status Exam:  Appearance:  ?? ??Appears stated age   Motor: ?? ??No abnormal movements   Speech/Language:  ?? ??Normal rate, volume, tone, fluency   Mood: ?? ??okay   Affect: ?? ??engaged; pleasant   Thought process: ?? ??Logical, linear, clear, coherent, goal directed   Thought content: ?? ?? ??Denies SI/HI??or thoughts of??self-harm   Perceptual disturbances: ?? ?? ??Behavior not concerning for response to internal stimuli  ??   Orientation: ?? ??Oriented to person, place, time, and general circumstances   Attention: ?? ??Able to fully attend without fluctuations in consciousness   Concentration: ?? ??Able to fully concentrate and attend   Memory: ?? ??Immediate, short-term, long-term, and recall grossly intact   ??Fund of knowledge:  ?? ??Consistent with level of education and development   Insight: ?? ?? ??Intact   Judgment:  ?? ??Intact   Impulse Control: ?? ??Intact   ??  Diagnosis: Recurrent major depressive disorder;  Anxiety associated with cancer diagnosis  ??  [name]. [name]  March 28,??2022

## 2020-04-17 NOTE — Unmapped (Cosign Needed)
Radiation Oncology Follow Up Visit Note    Patient Name: [facility]  Patient Age: 45 y.o.  Encounter Date: 04/19/2020    Referring Physician:   [name], MD  [address]  Medicine  [id number] Physician Office Building  [place],  [place] [address]    Primary Care Provider:  Ralene Cork, DO    Diagnoses:  1. Malignant melanoma, metastatic (CMS-HCC)    2. Malignant neoplasm of brain, unspecified location (CMS-HCC)        Treatment Site: whole brain to 30Gy completed 01/20/20  Right neck to 48 Gy completed 07/09/13    Interval Since Completion of Treatment: 3 months since WBRT      Assessment: Kenneth Fitzgerald is a 45 y.o. 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    He originally had a Stage III scalp melanoma in 2012, pT2pN2, s/p WLE and 3/3 positive SLN's, completion right level 2 neck dissection on 11/06/10 (0/25), one year of interferon completed 01/2012, and then right neck dissection 03/29/13 showing 1+/20 nodes (1cm with no ECE).    He also has multiply recurrent thyroid cancer in the same right neck dissection and is s/p RAI on 05/21/13.    He has had stable extracranial disease on dabrafenib/trametinib and was noted to ahve multiple punctate lesions??(best seen on T1 noncontrast sequence)??in the brain suspicious for metastasis that have been increasing in number since January 2020.  He completed WBRT to 30 Gy on 01/20/20.    At the end of his course of WBRT he developed a SBO and required surgery.  He has recovered pretty well from that.    He is having intermittent nausea, intermittent headaches and dental issues.    Today's MRI demonstrates intracranial control.  CT chest was NED.          Plan:     -Disease Status: intracranial control, NED chest, most recent abd imaging, MRI 03/22/20     -Care Plan: systemic therapy per Dr Nedra Hai     -Symptoms management: trial of uptitration of flonase w respect to nausea, zofran doesn't work great, if persistent, could consider zyprexa would recc discuss w palliative, unlikely dental issues related to prior neck radiation. Encouraged follow up with dental.     -Follow-up: 3 months with MRI brain      Interval History:  Kenneth Fitzgerald returns today for a regularly scheduled follow-up, he was last seen in clinic about one month ago.     --Interval History:   He has tapered off dex as of Saturday.    Today he notes ongoing intermittent nausea that he suspects PND.  Saw ENT who noted mucous retention cysts that may be contributing and has started flonase per ENT, he has just been doing one spray/nostril/day, though was ordered 2 sprays/nostril/bid.  He's going to uptitrate this and see if improves nausea. Last bout of nausea was Sunday.  Overall this seems to be improving.    Headaches are intermittent and respond to OTC agents.  He notes working full time, staring at 3 screens.    Has had some teeth break and crack, has been unable to get magic mouthwash per Dr Nedra Hai.    Having some trouble with sleep.     Works with Dr Electa Sniff and Dr Willaim Bane, has had good experiences with [employer].    Review of Systems: All other systems reviewed are negative. Pertinent positives and negatives are above in interval history.  Past Medical, Surgical, Family and Social Histories reviewed and updated in the electronic medical record.    Oncology History   Malignant melanoma, metastatic (CMS-HCC)   03/17/2013 Initial Diagnosis    Melanoma (CMS-HCC)     02/12/2018 - 03/12/2018 Chemotherapy    OP NIVOLUMAB 480 MG Q4W  nivolumab 480 mg every 28 days     Malignant neoplasm of brain, unspecified location (CMS-HCC)   12/22/2019 Initial Diagnosis    Malignant neoplasm of brain, unspecified location (CMS-HCC)     12/22/2019 -  Radiation    Radiation Therapy Treatment Details (Noted on 12/22/2019)  Site: Brain  Technique: IMRT  Goal: No goal specified  Planned Treatment Start Date: No planned start date specified           Physical Exam:  Vitals:    04/19/20 1507   Weight: (!) 132.3 kg (291 lb 9.6 oz)     ECOG: 80, Normal activity with effort; some signs or symptoms of disease (ECOG equivalent 1)  General/Constitutional: Well-appearing, NAD   HEENT: Normocephalic, atraumatic, no scleral icterus, missing teeth in back of mouth   Skin: No suspicious lesions or rashes  Pulmonary: No respiratory distress or increased work of breathing   Abdominal: Non distended  Musculoskeletal: Full range of motion in the extremities, without edema   Neurologic: Alert and oriented to conversation.  CN II-XII grossly intact.  Strength and sensation intact bilaterally.  Psychiatric: Appropriate affect and judgement        Radiology  MRI Brain 04/19/20  IMPRESSION:  Innumerable micrometastases decreased somewhat in number and conspicuity. No definitive new or enlarging lesions.  ??  CT Chest 04/19/20  No evidence of new thoracic metastasis.    MRI Abd 03/22/20  IMPRESSION:  1.There are postsurgical changes along the midline anterior wall. There is a 7.5 cm rim-enhancing fluid collection noted along the left aspect of the surgical incision site that abuts the left rectus abdominis muscle located approximately 4.5 cm beneath the skin surface. The sterility of this collection is unknown. Clinical condition is recommended but hematoma with surrounding chronic inflammatory changes is favored  2.Unchanged size and number of metastatic peritoneal implants. No new lesions are identified.  3.New peripherally rim-enhancing lesion in the left psoas muscle. This is worrisome for an metastasis versus less likely a tiny abscess. Clinical correlation is recommended. Short-term follow-up is recommended. Alternatively further assessment with PET/CT could be considered.  4.Chronic and incidental findings as detailed above.      Electronically signed by:  Sydnee Levans, PA-C  Department of Radiation Oncology  [facility]  [address], [address]  [place], [place] [address]  O: [phone]  April 19, 2020 5:00 PM

## 2020-04-18 NOTE — Unmapped (Signed)
[facility] Clinical Assessment & Refill Coordination Note    Kenneth Fitzgerald [facility], DOB: 19-Jan-1975  Phone: There are no phone numbers on file.    All above HIPAA information was verified with patient.     Was a Nurse, learning disability used for this call? No    Specialty Medication(s):   Hematology/Oncology: [facility]     Current Outpatient Medications   Medication Sig Dispense Refill   ??? acetaminophen (TYLENOL) 500 MG tablet Take 2 tablets (1,000 mg total) by mouth every eight (8) hours as needed for pain. 30 tablet 0   ??? aluminum-magnesium hydroxide-simethicone 200-200-20 mg/5 mL Susp 80 mL, diphenhydrAMINE 12.5 mg/5 mL Liqd 200 mg, nystatin 100,000 unit/mL Susp 8,000,000 Units, distilled water Liqd 80 mL, lidocaine 2% viscous 2 % Soln 80 mL Take 5 mL by mouth every six (6) hours as needed. Swish, gargle, spit as needed.  May be swallowed if esophageal involvement. 100 mL 0   ??? binimetinib ([name]) 15 mg tablet Take 3 tablets (45 mg total) by mouth Two (2) times a day. 180 tablet 11   ??? blood sugar diagnostic Strp Dispense 100 blood glucose test strips, ok to sub any brand preferred by insurance/patient, use 3x/day; dispense whatever brand matches with meter. 100 strip 12   ??? blood-glucose meter kit Use as instructed; dispense 1 meter, whatever is preferred by insurance 1 each 1   ??? calcitrioL (ROCALTROL) 0.25 MCG capsule Take 1 capsule (0.25 mcg total) by mouth daily. 30 capsule 11   ??? calcium carbonate 650 mg calcium (1,625 mg) tablet Take 1 tablet by mouth Three (3) times a day with a meal.     ??? dexAMETHasone (DECADRON) 2 MG tablet Take 1 tablet (2 mg total) by mouth 2 (two) times a day with meals. 60 tablet 0   ??? DULoxetine (CYMBALTA) 30 MG capsule TAKE 1 CAPSULE (30 MG TOTAL) BY MOUTH TWO (2) TIMES A DAY. 180 capsule 2   ??? encorafenib (BRAFTOVI) 75 mg capsule Take 6 capsules (450 mg total) by mouth daily. 180 capsule 11   ??? fexofenadine (ALLEGRA ALLERGY) 180 MG tablet Take 180 mg by mouth daily as needed (allergies).      ??? fluticasone propionate (FLONASE) 50 mcg/actuation nasal spray 2 sprays into each nostril two (2) times a day. 16 g 6   ??? folic acid (FOLVITE) 1 MG tablet Take 1 tablet (1 mg total) by mouth daily. 30 tablet 11   ??? ibuprofen (ADVIL,MOTRIN) 200 MG tablet Take 600 mg by mouth daily as needed for pain.     ??? lancets Misc Dispense 100 lancets, ok to sub any brand preferred by insurance/patient, use 3x/day 100 each 12   ??? levothyroxine (SYNTHROID) 125 MCG tablet Take by mouth daily. Take 2 tablets (250 mcg total) every Sat and Sun. Take 3 tablets (375 mcg total) every Mon, Tue, Wed, Thur and Fri.     ??? melatonin 3 mg Tab Take 1 tablet (3 mg total) by mouth every evening. 90 tablet 1   ??? memantine (NAMENDA) 10 MG tablet Take 10 mg by mouth daily.     ??? metFORMIN (GLUCOPHAGE) 500 MG tablet Take 1 tablet (500 mg total) by mouth 2 (two) times a day with meals. (Patient taking differently: Take 500 mg by mouth daily. ) 180 tablet 3   ??? ondansetron (ZOFRAN) 8 MG tablet Take 1 tablet (8 mg total) by mouth every eight (8) hours as needed for nausea. 60 tablet 2   ???  sulfaSALAzine (AZULFIDINE) 500 mg tablet Take 2 tablets (1000mg ) by mouth twice daily for 7 days, then take 4 tablets (2000mg ) twice daily thereafter. 240 tablet 11     No current facility-administered medications for this visit.        Changes to medications: Ihsan reports no changes at this time.    Allergies   Allergen Reactions   ??? Compazine [Prochlorperazine] Itching   ??? Coconut Nausea And Vomiting   ??? Multihance [Gadobenate Dimeglumine] Other (See Comments)     Patient sneezed immediately after administration of contrast.       Changes to allergies: No    SPECIALTY MEDICATION ADHERENCE     [name] 15 mg: 10 days of medicine on hand     Medication Adherence    Patient reported X missed doses in the last month: 0  Specialty Medication: [name] 15mg           Specialty medication(s) dose(s) confirmed: Regimen is correct and unchanged.     Are there any concerns with adherence? No    Adherence counseling provided? Not needed    CLINICAL MANAGEMENT AND INTERVENTION      Clinical Benefit Assessment:    Do you feel the medicine is effective or helping your condition? Yes    Clinical Benefit counseling provided? Not needed    Adverse Effects Assessment:    Are you experiencing any side effects? No    Are you experiencing difficulty administering your medicine? No    Quality of Life Assessment:    How many days over the past month did your condition/medication  keep you from your normal activities? For example, brushing your teeth or getting up in the morning. 0    Have you discussed this with your provider? Not needed    Acute Infection Status:    Acute infections noted within Epic:  No active infections  Patient reported infection: None    Therapy Appropriateness:    Is therapy appropriate? Yes, therapy is appropriate and should be continued    DISEASE/MEDICATION-SPECIFIC INFORMATION      N/A    PATIENT SPECIFIC NEEDS     - Does the patient have any physical, cognitive, or cultural barriers? No    - Is the patient high risk? Yes, patient is taking oral chemotherapy. Appropriateness of therapy as been assessed    - Does the patient require a Care Management Plan? No     - Does the patient require physician intervention or other additional services (i.e. nutrition, smoking cessation, social work)? No      SHIPPING     Specialty Medication(s) to be Shipped:   Hematology/Oncology: [name]    Other medication(s) to be shipped: No additional medications requested for fill at this time     Changes to insurance: No    Delivery Scheduled: Yes, Expected medication delivery date: 04/25/20.     Medication will be delivered via UPS to the confirmed home address in [facility].    The patient will receive a drug information handout for each medication shipped and additional FDA Medication Guides as required.  Verified that patient has previously received a Conservation officer, historic buildings and a Surveyor, mining.    All of the patient's questions and concerns have been addressed.    Rollen Sox   [facility] Specialty Pharmacist

## 2020-04-18 NOTE — Unmapped (Signed)
[facility] Clinical Assessment & Refill Coordination Note    Dhillon [place] [facility], DOB: 1975/07/05  Phone: There are no phone numbers on file.    All above HIPAA information was verified with patient.     Was a Nurse, learning disability used for this call? No    Specialty Medication(s):   Hematology/Oncology: Braftovi     Current Outpatient Medications   Medication Sig Dispense Refill   ??? acetaminophen (TYLENOL) 500 MG tablet Take 2 tablets (1,000 mg total) by mouth every eight (8) hours as needed for pain. 30 tablet 0   ??? aluminum-magnesium hydroxide-simethicone 200-200-20 mg/5 mL Susp 80 mL, diphenhydrAMINE 12.5 mg/5 mL Liqd 200 mg, nystatin 100,000 unit/mL Susp 8,000,000 Units, distilled water Liqd 80 mL, lidocaine 2% viscous 2 % Soln 80 mL Take 5 mL by mouth every six (6) hours as needed. Swish, gargle, spit as needed.  May be swallowed if esophageal involvement. 100 mL 0   ??? binimetinib ([name]) 15 mg tablet Take 3 tablets (45 mg total) by mouth Two (2) times a day. 180 tablet 11   ??? blood sugar diagnostic Strp Dispense 100 blood glucose test strips, ok to sub any brand preferred by insurance/patient, use 3x/day; dispense whatever brand matches with meter. 100 strip 12   ??? blood-glucose meter kit Use as instructed; dispense 1 meter, whatever is preferred by insurance 1 each 1   ??? calcitrioL (ROCALTROL) 0.25 MCG capsule Take 1 capsule (0.25 mcg total) by mouth daily. 30 capsule 11   ??? calcium carbonate 650 mg calcium (1,625 mg) tablet Take 1 tablet by mouth Three (3) times a day with a meal.     ??? dexAMETHasone (DECADRON) 2 MG tablet Take 1 tablet (2 mg total) by mouth 2 (two) times a day with meals. 60 tablet 0   ??? DULoxetine (CYMBALTA) 30 MG capsule TAKE 1 CAPSULE (30 MG TOTAL) BY MOUTH TWO (2) TIMES A DAY. 180 capsule 2   ??? encorafenib (BRAFTOVI) 75 mg capsule Take 6 capsules (450 mg total) by mouth daily. 180 capsule 11   ??? fexofenadine (ALLEGRA ALLERGY) 180 MG tablet Take 180 mg by mouth daily as needed (allergies).      ??? fluticasone propionate (FLONASE) 50 mcg/actuation nasal spray 2 sprays into each nostril two (2) times a day. 16 g 6   ??? folic acid (FOLVITE) 1 MG tablet Take 1 tablet (1 mg total) by mouth daily. 30 tablet 11   ??? ibuprofen (ADVIL,MOTRIN) 200 MG tablet Take 600 mg by mouth daily as needed for pain.     ??? lancets Misc Dispense 100 lancets, ok to sub any brand preferred by insurance/patient, use 3x/day 100 each 12   ??? levothyroxine (SYNTHROID) 125 MCG tablet Take by mouth daily. Take 2 tablets (250 mcg total) every Sat and Sun. Take 3 tablets (375 mcg total) every Mon, Tue, Wed, Thur and Fri.     ??? melatonin 3 mg Tab Take 1 tablet (3 mg total) by mouth every evening. 90 tablet 1   ??? memantine (NAMENDA) 10 MG tablet Take 10 mg by mouth daily.     ??? metFORMIN (GLUCOPHAGE) 500 MG tablet Take 1 tablet (500 mg total) by mouth 2 (two) times a day with meals. (Patient taking differently: Take 500 mg by mouth daily. ) 180 tablet 3   ??? ondansetron (ZOFRAN) 8 MG tablet Take 1 tablet (8 mg total) by mouth every eight (8) hours as needed for nausea. 60 tablet 2   ???  sulfaSALAzine (AZULFIDINE) 500 mg tablet Take 2 tablets (1000mg ) by mouth twice daily for 7 days, then take 4 tablets (2000mg ) twice daily thereafter. 240 tablet 11     No current facility-administered medications for this visit.        Changes to medications: [place] reports no changes at this time.    Allergies   Allergen Reactions   ??? Compazine [Prochlorperazine] Itching   ??? Coconut Nausea And Vomiting   ??? Multihance [Gadobenate Dimeglumine] Other (See Comments)     Patient sneezed immediately after administration of contrast.       Changes to allergies: No    SPECIALTY MEDICATION ADHERENCE     Braftovi 75 mg: 10 days of medicine on hand     Medication Adherence    Patient reported X missed doses in the last month: 0  Specialty Medication: Braftovi 75mg           Specialty medication(s) dose(s) confirmed: Regimen is correct and unchanged.     Are there any concerns with adherence? No    Adherence counseling provided? Not needed    CLINICAL MANAGEMENT AND INTERVENTION      Clinical Benefit Assessment:    Do you feel the medicine is effective or helping your condition? Yes    Clinical Benefit counseling provided? Not needed    Adverse Effects Assessment:    Are you experiencing any side effects? No    Are you experiencing difficulty administering your medicine? No    Quality of Life Assessment:    How many days over the past month did your condition/medication  keep you from your normal activities? For example, brushing your teeth or getting up in the morning. 0    Have you discussed this with your provider? Not needed    Acute Infection Status:    Acute infections noted within Epic:  No active infections  Patient reported infection: None    Therapy Appropriateness:    Is therapy appropriate? Yes, therapy is appropriate and should be continued    DISEASE/MEDICATION-SPECIFIC INFORMATION      N/A    PATIENT SPECIFIC NEEDS     - Does the patient have any physical, cognitive, or cultural barriers? No    - Is the patient high risk? Yes, patient is taking oral chemotherapy. Appropriateness of therapy as been assessed    - Does the patient require a Care Management Plan? No     - Does the patient require physician intervention or other additional services (i.e. nutrition, smoking cessation, social work)? No      SHIPPING     Specialty Medication(s) to be Shipped:   Hematology/Oncology: Braftovi    Other medication(s) to be shipped: No additional medications requested for fill at this time     Changes to insurance: No    Delivery Scheduled: Yes, Expected medication delivery date: 04/25/20.     Medication will be delivered via UPS to the confirmed home address in [facility].    The patient will receive a drug information handout for each medication shipped and additional FDA Medication Guides as required.  Verified that patient has previously received a Conservation officer, historic buildings and a Surveyor, mining.    All of the patient's questions and concerns have been addressed.    Rollen Sox   [facility] Specialty Pharmacist

## 2020-04-19 ENCOUNTER — Ambulatory Visit: Admit: 2020-04-19 | Discharge: 2020-05-13 | Payer: PRIVATE HEALTH INSURANCE

## 2020-04-19 ENCOUNTER — Encounter
Admit: 2020-04-19 | Discharge: 2020-05-13 | Payer: PRIVATE HEALTH INSURANCE | Attending: Hematology & Oncology | Primary: Hematology & Oncology

## 2020-04-19 ENCOUNTER — Encounter: Admit: 2020-04-19 | Discharge: 2020-05-13 | Payer: PRIVATE HEALTH INSURANCE

## 2020-04-19 ENCOUNTER — Encounter
Admit: 2020-04-19 | Discharge: 2020-05-13 | Payer: PRIVATE HEALTH INSURANCE | Attending: Registered" | Primary: Registered"

## 2020-04-19 ENCOUNTER — Encounter
Admit: 2020-04-19 | Discharge: 2020-05-13 | Payer: PRIVATE HEALTH INSURANCE | Attending: Radiation Oncology | Primary: Radiation Oncology

## 2020-04-19 DIAGNOSIS — C799 Secondary malignant neoplasm of unspecified site: Principal | ICD-10-CM

## 2020-04-19 DIAGNOSIS — R630 Anorexia: Principal | ICD-10-CM

## 2020-04-19 DIAGNOSIS — G479 Sleep disorder, unspecified: Principal | ICD-10-CM

## 2020-04-19 MED ORDER — MIRTAZAPINE 7.5 MG TABLET
ORAL_TABLET | Freq: Every evening | ORAL | 0 refills | 30 days | Status: CP
Start: 2020-04-19 — End: 2020-05-19

## 2020-04-19 MED ORDER — ALUMINUM-MAG HYDROXIDE-SIMETHICONE 200 MG-200 MG-20 MG/5 ML ORAL SUSP
0 refills | 0 days | Status: CP
Start: 2020-04-19 — End: ?
  Filled 2020-04-19: qty 355, 14d supply, fill #0

## 2020-04-19 MED ORDER — DIPHENHYDRAMINE 12.5 MG/5 ML ORAL LIQUID
0 refills | 0 days | Status: CP
Start: 2020-04-19 — End: ?
  Filled 2020-04-19: qty 118, 6d supply, fill #0

## 2020-04-19 MED ORDER — MAGIC MOUTHWASH (NYSTATIN/DIPHENHYDRAMINE/MYLANTA) WITH LIDOCAINE ORAL MIXTURE
Freq: Four times a day (QID) | ORAL | 6 refills | 0 days | Status: CP | PRN
Start: 2020-04-19 — End: 2020-04-19

## 2020-04-19 MED ORDER — LIDOCAINE HCL 2 % MUCOSAL SOLUTION
0 refills | 0 days | Status: CP
Start: 2020-04-19 — End: ?
  Filled 2020-04-19: qty 100, 6d supply, fill #0

## 2020-04-19 MED ADMIN — iohexoL (OMNIPAQUE) 350 mg iodine/mL solution 75 mL: 75 mL | INTRAVENOUS | @ 14:00:00 | Stop: 2020-04-19

## 2020-04-19 MED ADMIN — gadoterate meglumine (DOTAREM) Soln 20 mL: 20 mL | INTRAVENOUS | @ 14:00:00 | Stop: 2020-04-19

## 2020-04-19 NOTE — Unmapped (Addendum)
OUTPATIENT ONCOLOGY PALLIATIVE CARE    Principal Diagnosis: Kenneth Fitzgerald is a 45 y.o. male with metastatic melanoma, diagnosed in 3. Disease sites include metastases to brain and abdominal/pelvic lymph nodes.     Assessment/Plan:   1. Fatigue/Insomnia: Patient endorses improved sleep since his work schedule has adjusted, and he is no longer on night call.  In the past, he endorsed improved sleep associated with melatonin use. Finished taper of dexamethasone for headache due to brain mets 4/1.  Plan:  -START Mirtazapine 7.5 mg nightly to target insomnia, appetite, and sleep. Will plan to check in with pt in ~2 weeks to assess effect.  -Advised patient that it is safe to continue melatonin 3mg  at bedtime and take as needed - not taking currently as he does not feel this has helped in past few months  -defer option for OSA work-up to his PCP    2. Decreased appetite/Nausea: Patient endorsing intermittent nausea/dry heaving episodes which he suspects is due to mucus pockets in sinuses. Episodes happen sporadically without predictable pattern, on average ~3 times/week, each episode lasts 15-20 min. Has not seen much relief with PRN ondansetron.  -Continue Zofran 8 mg q8h PRN nausea  -Adding Mirtazapine 7.5 mg today; discussed benefits for appetite stimulation  -Advised pt to trial non-pharmacologic measure of inhaled isopropyl alcohol for nausea; recommended he purchase alcohol swabs and try smelling at onset of nausea  -Discussed w/ Dr. Genice Rouge; suspect nausea could be vestibular in origin; given intermittent nature may trial OTC meclizine 12.5 mg q6h PRN at next check-in    3. Depression/Anxiety: Patient is currently on Cymbalta and reports good response.  Plan:  -Continue Cymbalta 30 mg twice daily per psychiatry  -Adding Mirtazapine 7.5 mg today; discussed additional benefits for depression/anxiety    4. Coping: Patient endorses ups and downs since being diagnosed with metastatic melanoma.  He has had difficulty recently due to friends dying from cancer, and he currently has a cousin with GBM on hospice care.  He follows with [facility] as well.  Plan:  -Will continue to support Kenneth Fitzgerald and his coping    5. Advance Care Planning: Patient has an old advance directive scanned into chart, which has his father listed as his healthcare decision maker (who is now deceased).  He states that his sister, Kenneth Fitzgerald, is now his designated Oceanographer.  He has been given a copy of the [place] advance directive (prepare for your care) at a prior visit.  Plan:  -Will follow up at future visit on Kenneth Fitzgerald's thoughts and willingness to complete another advanced directive    F/u: 1-2 months; in conjunction with Oncology or RadOnc appointments if possible    ----------------------------------------  Referring Provider: Dr. Lendell Caprice  Oncology Team: Dr. Nedra Hai, Dr. Despina Hick  PCP: Ralene Cork, DO      HPI:  45 year old white male with metastatic melanoma (Mets to lymph nodes and brain), history of thyroid cancer, history of portal vein thrombosis, and depression who presents for initial palliative care visit.  Dr. Nedra Hai with heme-onc referred patient for symptom management.  Patient also follows with Dr. Willaim Bane and Dr. Electa Sniff for his depression and anxiety.  Patient reports that in general he has been doing okay, but he has had decreased energy and fatigue.  Patient notes he works approximately 60 to 70 hours/week.  His fatigue has been worsening over the last few months.  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 In regards to patient's prognosis, he states that he doesn't want to know timeline unless his death is imminent.    Current cancer-directed therapy: Arva Chafe    Interval history, 09/17/2019, [facility]: Follow-up.  This is a video visit.  ???Kenneth Fitzgerald reports that use of melatonin 3 mg has been helpful for his sleep.  On his best night he slept 6 and half hours and his worst night after taking melatonin was 4-1/2 hours of sleep.  Prior to taking melatonin he notes that he was sleeping only 1 to 1-1/2 hours per night.  He does note that he has been taking night call for work which frequently interrupts his sleep and he does not take melatonin on most nights.  He hopes to cut down on work hours and is applying for disability.  ???States mood is good.  Takes Cymbalta 30 mg twice a day.  ???Had colonic biopsies which were negative for malignancy though raise possibility of inflammatory bowel disease.  Plan for consultation.  ???Kenneth Fitzgerald states that he is overall functioning well.  He looks forward to playing in a band this weekend at his church and enjoying the Labor Day weekend.    Interval history, 12/31/19 Follow-up (video visit):   Today, Kenneth Fitzgerald notes that he is doing okay, but he has some anxiety about starting radiation next week.  He notes that he has been through radiation before, so he is prepared for having to wear the mask during the treatments; he notes that he is scheduled to get 10 treatments this course.  He notes that he recently changed position at work, so his hours are better and he is not doing as many nights and weekends.  He endorses improved sleep recently, noting that he only occasionally gets up during the night, but nothing like he used to.  He has not been taking melatonin recently due to his improved sleep and work schedule adjustments, but he still has melatonin in case he feels like he needs to take again.  Lately, he notes that he has been dealing with some vision trouble, stating that he is not able to focus on things in the distance as well as he used to, which he thinks is age-related.  He also notes having some dizziness and headaches, which he thinks is related to the sulfasalazine, since his symptoms have been occurring since he started that medication.  He denies any falls, stating that he will sit down or hold on to something if he feels unsteady from the dizziness.  He notes that his abdominal pain is doing much better, and he denies any nausea or vomiting.  He endorses a good appetite and regular bowel movements.  He notes that his mood has been doing okay, but he has had a rough time recently due to a few friends passing away from cancer.  He also notes that he has a male cousin with GBM currently on hospice care, and he has been trying to support his cousin's wife in his care, but he notes it has been difficult to see his cousin go through this.  He notes that he has many friends who have been offering to help him, but he feels like he does not need anything at the moment.  He is going to visit his [place] in the [place] this weekend to celebrate Christmas before he starts  his radiation treatments.  He notes that sometimes, he feels like it always on his mind, but he is trying to deal with that the best he can.  He notes that sometimes are heavy, and some times he does not think about his cancer at all.  He tries to do things to take his mind off it during the heavy times, such as playing with his dog.    Interval history, 04/19/20 - In person, VR + Kenneth Fitzgerald  - Admitted for SBO 1/4 - 02/02/20, Open primary ventral hernia repair. BMs now regular; no constipation.  - Recently finished course of dexamethasone (taper finished this past weekend) for headaches related to brain mets; states headaches have not been bothering him recently  - Mood/anxiety: follows with [facility]; prescribed duloxetine 30 mg BID  - Nausea/dizziness: Has occasional dizzy spells. Also has episodes of nausea that lead to dry heaving. Feels this episodes are related to mucus pockets in sinus. States these episodes are sporadic and has not noticed any pattern provoking episodes. Some days will have no episodes; other days will have multiple episodes per day. States happens on average 3 days/week and each episode lasts 15-20 min. States hard to tell if Zofran helping.   - Fatigue/insomnia: feels he has not been sleeping well for the past few months; falls asleep but has trouble staying asleep. Does report that his energy has been gradually improving since hospitalization.  - Appetite: feels like this has decreased in the last few months; eating less than he used to  - Coping/support: Recently discussed prognosis with Dr. 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  Hobbies: playing fetch with his dog, [name] (12 year old labradoodle); plays bass in band at church every couple of weeks  Current residence / distance from [place]: [place], [place] (42 miles from [facility] hospitals)    Advance Care Planning:   HCPOA: sister, [name]  Natural surrogate decision maker: sister, [name] & brother, Alaa Eyerman  Living Will: yes  ACP note: no    Objective     Oncology History   Malignant melanoma, metastatic (CMS-HCC)   03/17/2013 Initial Diagnosis    Melanoma (CMS-HCC)     02/12/2018 - 03/12/2018 Chemotherapy    OP NIVOLUMAB 480 MG Q4W  nivolumab 480 mg every 28 days     Malignant neoplasm of brain, unspecified location (CMS-HCC)   12/22/2019 Initial Diagnosis    Malignant neoplasm of brain, unspecified location (CMS-HCC)     12/22/2019 -  Radiation    Radiation Therapy Treatment Details (Noted on 12/22/2019)  Site: Brain  Technique: IMRT  Goal: No goal specified  Planned Treatment Start Date: No planned start date specified         Patient Active Problem List   Diagnosis   ??? 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 Malignant neoplasm of thyroid gland (CMS-HCC)   ??? Postoperative hypothyroidism   ???  Hypocalcemia   ??? Thyroid cancer (CMS-HCC)   ??? Malignant melanoma, metastatic (CMS-HCC)   ??? Ventral hernia with bowel obstruction   ??? Insomnia   ??? Crohn's disease of large intestine without complication (CMS-HCC)   ??? Malignant neoplasm of brain, unspecified location (CMS-HCC)   ??? Gout   ??? 01/24/2020: Open, primary ventral hernia repair for SBO   ??? Morbid obesity with BMI of 40.0-44.9, adult (CMS-HCC)       Past Medical History:   Diagnosis Date   ??? Cancer (CMS-HCC)     melanoma, thyroid cancer   ??? Disease of thyroid gland     hypothyroid   ??? Hypothyroidism    ??? Skin cancer        Past Surgical History:   Procedure Laterality Date   ??? PR COLONOSCOPY W/BIOPSY SINGLE/MULTIPLE N/A 01/09/2018    Procedure: COLONOSCOPY, FLEXIBLE, PROXIMAL TO SPLENIC FLEXURE; WITH BIOPSY, SINGLE OR MULTIPLE;  Surgeon: Vonda Antigua, MD;  [facility];  Service: Gastroenterology   ??? PR COLONOSCOPY W/BIOPSY SINGLE/MULTIPLE N/A 08/24/2019    Procedure: COLONOSCOPY, FLEXIBLE, PROXIMAL TO SPLENIC FLEXURE; WITH BIOPSY, SINGLE OR MULTIPLE;  Surgeon: Leland Her, MD;  [facility];  Service: Gastroenterology   ??? PR COLSC FLX WITH DIRECTED SUBMUCOSAL NJX ANY SBST N/A 08/24/2019    Procedure: COLONOSCOPY, FLEXIBLE, PROXIMAL TO SPLENIC FLEXURE; WITH DIRECTED SUBMUCOSAL INJECTION(S), ANY SUBSTANCE;  Surgeon: Leland Her, MD;  [facility];  Service: Gastroenterology   ??? PR EXPLORATORY OF ABDOMEN N/A 01/24/2020    Procedure: EXPLORATORY LAPAROTOMY, EXPLORATORY CELIOTOMY WITH OR WITHOUT BIOPSY(S);  Surgeon: [name], MD;  Location: MAIN OR [facility];  Service: Trauma   ??? PR FREEING BOWEL ADHESION,ENTEROLYSIS N/A 01/24/2020 Procedure: ENTEROLYSIS (SEPART PROC);  Surgeon: [name], MD;  Location: MAIN OR [facility];  Service: Trauma   ??? PR IMPLANT MESH HERNIA REPAIR/DEBRIDEMENT CLOSURE N/A 01/03/2015    Procedure: IMPLANTATION OF MESH/OTHER PROSTHES INCISION/VENTRAL HERNIA REPAIR/MESH CLOSE DEBRID NECROT SOFT TIS INFECT;  Surgeon: Romero Belling, MD;  Location: MAIN OR [place];  Service: Gastrointestinal   ??? PR LAP, VENTRAL HERNIA REPAIR,REDUCIBLE N/A 04/07/2014    Procedure: LAPAROSCOPY, SURGICAL, REPAIR, VENTRAL, UMBILICAL, SPIGELIAN OR EPIGASTRIC HERNIA, REDUCIBLE;  Surgeon: Romero Belling, MD;  Location: MAIN OR [place];  Service: Gastrointestinal   ??? PR NEGATIVE PRESSURE WOUND THERAPY DME >50 SQ CM N/A 01/24/2020    Procedure: NEG PRESS WOUND TX (VAC ASSIST) INCL TOPICALS, PER SESSION, TSA GREATER THAN/= 50 CM SQUARED;  Surgeon: [name], MD;  Location: MAIN OR [place];  Service: Trauma   ??? PR REMOVAL NODES, NECK,CERV CMPLT Left 12/07/2012    Procedure: CERVICAL LYMPHADENECTOMY (COMPLETE);  Surgeon: Charlott Rakes, MD;  Location: MA[facility] OR [facility];  Service: Surgical Oncology   ??? PR REMOVAL NODES, NECK,CERV MOD RAD Right 03/29/2013    Procedure: CERVICAL LYMPHADENECTOMY (MODIFIED RADICAL NECK DISSECTION);  Surgeon: Charlott Rakes, MD;  Location: MAIN OR [facility];  Service: Surgical Oncology   ??? PR REPAIR RECURR INCIS HERNIA,REDUC N/A 01/03/2015    Procedure: REPAIR RECURRENT INCISIONAL OR VENTRAL HERNIA; REDUCIBLE;  Surgeon: Romero Belling, MD;  Location: MAIN OR [place];  Service: Gastrointestinal   ??? PR REPAIR RECURR INCIS HERNIA,STRANG N/A 06/21/2016    Procedure: REPAIR RECURRENT INCISIONAL OR VENTRAL HERNIA; INCARCERATED OR STRANGULATED;  Surgeon: Mickle Asper, MD;  Location: MAIN OR [place];  Service: Gastrointestinal   ??? PR REPAIR RECURR INCIS HERNIA,STRANG N/A 01/24/2020    Procedure: REPAIR  RECURRENT INCISIONAL OR VENTRAL HERNIA; INCARCERATED OR STRANGULATED;  Surgeon: [name], MD;  Location: [facility];  Service: Trauma   ??? PR SIGMOIDOSCOPY,FINE NEEDL BX,US GUIDED N/A 01/09/2018    Procedure: SIGMOIDOSCOPY, FLEXIBLE, W/TRANSENDOSCOPIC ULTRASOUND GUIDED NEEDLE ASPIRATION;  Surgeon: Vonda Antigua, MD;  [facility];  Service: Gastroenterology       Current Outpatient Medications   Medication Sig Dispense Refill   ??? acetaminophen (TYLENOL) 500 MG tablet Take 2 tablets (1,000 mg total) by mouth every eight (8) hours as needed for pain. 30 tablet 0   ??? aluminum-magnesium hydroxide-simethicone 200-200-20 mg/5 mL Susp 80 mL, diphenhydrAMINE 12.5 mg/5 mL Liqd 200 mg, nystatin 100,000 unit/mL Susp 8,000,000 Units, distilled water Liqd 80 mL, lidocaine 2% viscous 2 % Soln 80 mL Take 5 mL by mouth every six (6) hours as needed. Swish, gargle, spit as needed.  May be swallowed if esophageal involvement. 100 mL 0   ??? binimetinib ([name]) 15 mg tablet Take 3 tablets (45 mg total) by mouth Two (2) times a day. 180 tablet 11   ??? blood sugar diagnostic Strp Dispense 100 blood glucose test strips, ok to sub any brand preferred by insurance/patient, use 3x/day; dispense whatever brand matches with meter. 100 strip 12   ??? blood-glucose meter kit Use as instructed; dispense 1 meter, whatever is preferred by insurance 1 each 1   ??? calcitrioL (ROCALTROL) 0.25 MCG capsule Take 1 capsule (0.25 mcg total) by mouth daily. 30 capsule 11   ??? calcium carbonate 650 mg calcium (1,625 mg) tablet Take 1 tablet by mouth Three (3) times a day with a meal.     ??? dexAMETHasone (DECADRON) 2 MG tablet Take 1 tablet (2 mg total) by mouth 2 (two) times a day with meals. 60 tablet 0   ??? DULoxetine (CYMBALTA) 30 MG capsule TAKE 1 CAPSULE (30 MG TOTAL) BY MOUTH TWO (2) TIMES A DAY. 180 capsule 2   ??? encorafenib (BRAFTOVI) 75 mg capsule Take 6 capsules (450 mg total) by mouth daily. 180 capsule 11   ??? fexofenadine (ALLEGRA ALLERGY) 180 MG tablet Take 180 mg by mouth daily as needed (allergies).      ??? fluticasone propionate (FLONASE) 50 mcg/actuation nasal spray 2 sprays into each nostril two (2) times a day. 16 g 6   ??? folic acid (FOLVITE) 1 MG tablet Take 1 tablet (1 mg total) by mouth daily. 30 tablet 11   ??? ibuprofen (ADVIL,MOTRIN) 200 MG tablet Take 600 mg by mouth daily as needed for pain.     ??? lancets Misc Dispense 100 lancets, ok to sub any brand preferred by insurance/patient, use 3x/day 100 each 12   ??? levothyroxine (SYNTHROID) 125 MCG tablet Take by mouth daily. Take 2 tablets (250 mcg total) every Sat and Sun. Take 3 tablets (375 mcg total) every Mon, Tue, Wed, Thur and Fri.     ??? melatonin 3 mg Tab Take 1 tablet (3 mg total) by mouth every evening. 90 tablet 1   ??? memantine (NAMENDA) 10 MG tablet Take 10 mg by mouth daily.     ??? metFORMIN (GLUCOPHAGE) 500 MG tablet Take 1 tablet (500 mg total) by mouth 2 (two) times a day with meals. (Patient taking differently: Take 500 mg by mouth daily. ) 180 tablet 3   ??? ondansetron (ZOFRAN) 8 MG tablet Take 1 tablet (8 mg total) by mouth every eight (8) hours as needed for nausea. 60 tablet 2   ???  sulfaSALAzine (AZULFIDINE) 500 mg tablet Take 2 tablets (1000mg ) by mouth twice daily for 7 days, then take 4 tablets (2000mg ) twice daily thereafter. 240 tablet 11     No current facility-administered medications for this visit.       Allergies:   Allergies   Allergen Reactions   ??? Compazine [Prochlorperazine] Itching   ??? Coconut Nausea And Vomiting   ??? Multihance [Gadobenate Dimeglumine] Other (See Comments)     Patient sneezed immediately after administration of contrast.       Family History:  Cancer-related family history includes Colon cancer in his paternal grandmother; Prostate cancer in his father.  He indicated that the status of his mother is unknown. He indicated that the status of his father is unknown. He indicated that the status of his paternal grandmother is unknown. He indicated that the status of his neg hx is unknown.      REVIEW OF SYSTEMS:  A comprehensive review of 10 systems was negative except for pertinent positives noted in HPI.      PHYSICAL EXAM: - video visit  GEN: Awake and alert, comfortable appearing male in no acute distress  Pulm: Nonlabored respirations  Neuro: Alert, normal speech  Psych: Appropriate mood and affect    Lab Results   Component Value Date    CREATININE 0.96 03/01/2020     Lab Results   Component Value Date    ALKPHOS 64 03/01/2020    BILITOT 0.3 03/01/2020    BILIDIR 0.20 01/27/2020    PROT 6.7 03/01/2020    ALBUMIN 3.8 03/01/2020    ALT 9 (L) 03/01/2020    AST 18 03/01/2020            Time spent with patient was 25 minutes.  Additional 25 minutes were spent on preparation, documentation and coordinating care.    Hector Shade, PharmD, BCPS, CPP  [facility] Outpatient Oncology Palliative Care

## 2020-04-19 NOTE — Unmapped (Signed)
error 

## 2020-04-19 NOTE — Unmapped (Signed)
04/19/2020      Subjective/Assessment/Recommendations: Pt in radiation oncology clinic today for follow up regarding radiation treatment for whole brain. Pt finished treatment on 01/20/2020.    1. Fatigue: Mild  2. Chemo: Administered concurrently  3. Pain: Mild and mild headaches about 2-3 per week  4. Neurologic status: Headache, Vision, Nausea/vomiting and mild headaches (theyve improved lately) about 2-3 per week, pt having dizziness more often now, nauseated-dry heaving with phlegm, loss of taste and appetite.recent vision changes where pt cannot focus on the distance as well as he used to- worsening since January.  5. Psychosocial: Has home support     Pt not taking dexamethasone since Saturday April 2nd per order.

## 2020-04-21 ENCOUNTER — Telehealth: Admit: 2020-04-21 | Discharge: 2020-04-22 | Payer: PRIVATE HEALTH INSURANCE | Attending: Clinical | Primary: Clinical

## 2020-04-21 DIAGNOSIS — F411 Generalized anxiety disorder: Principal | ICD-10-CM

## 2020-04-21 DIAGNOSIS — C801 Malignant (primary) neoplasm, unspecified: Principal | ICD-10-CM

## 2020-04-21 DIAGNOSIS — F330 Major depressive disorder, recurrent, mild: Principal | ICD-10-CM

## 2020-04-21 NOTE — Unmapped (Signed)
[facility]  Psychiatry  Psychotherapy Note - Telehealth via??Video  ??  Service Date: April 8,??2022  Service:??45??minutes of??psychotherapy via??video??session  Time with Patient:??45??minutes    Encounter Description:??This encounter was conducted from provider's office via EPIC video session due to COVID-19 pandemic. Severiano Utsey was located in his home. Rationale for??video session??is need to social distance. See Plan for telemedicine consent/disclaimer.   ??  Encounter Description/Consent:??  [name] visit was completed through telehealth encounter (video).   ????  This patient encounter is appropriate and reasonable under the circumstances. The patient has been advised of the potential risks and limitations of this mode of treatment (including, but not limited to, the absence of in-person examination) and has agreed to be treated in a remote fashion in spite of them. Any and all of the patient's/patient's family's questions on this issue have been answered.     The patient was physically located in [place] in which I am permitted to provide care. The patient??understood that??he may incur co-pays and cost sharing, and agreed to the telemedicine visit. The visit was??reasonable and appropriate under the circumstances given the patient's presentation at the time.   ????  Time Spent: 45??minutes  ??  Assessment:  Mr. Bobier a 45yo??male??with metastatic melanoma, h/o thyroid cancer, recent portal vein thrombosis, who I see in therapy to address depression and enhance coping with??multiple??stressors.?? We met today via telehealth (video). Mr.??Pflaum reported largely stable psychosocial functioning in the time since our last session. He is relieved about the preliminary results of his recent MRI and continues to make progress on stated goals to spend his time meaningfully.  No emergent??psych??concerns. ??I offered psychotherapy, support,??and recommendations. ??Will see next in three weeks.  ??  Risk Assessment:  A suicide and violence risk assessment was performed as part of this evaluation. There patient is deemed to be at chronic elevated risk??for self-harm/suicide given the following factors: current diagnosis of depression, suicidal ideation or threats without a plan and past diagnosis of depression. There patient is deemed to be at chronic elevated risk??for violence given the following factors: male gender. These risk factors are mitigated by the following factors: lack of active SI/HI. There is no acute risk for suicide or violence at this time. The patient was educated about relevant modifiable risk factors including following recommendations for treatment of psychiatric illness and abstaining from substance abuse.??While future psychiatric events cannot be accurately predicted, the patient does not currently require acute inpatient psychiatric care and does not currently meet [facility] involuntary commitment criteria.??????????????????  ??  Plan:  1.??I will continue to see pt for psychotherapy. ??Next session??is??scheduled??to take place in three weeks.  2. Pt??has??information on available [facility] resources.??Pt is working with [name], [name]. ??I have reached out to [name] to explore financial resources.  3.??Pt??has been followed by Dr. Lawernce Keas of Psychiatry.  4. Pt has??my contact information and knows??to reach me as needed.  ??  Subjective:??  Mr. Chhim??presented as alert and oriented.????Affect was relatively bright and he was engaged throughout the session.  ??  Mr. Penza reported largely stable overall psychosocial functioning during the time since our last session.  He noted pretty good mood for the most part and staying active with his church. He did note sleeping problems, but does not believe that are rooted or anxiety or mood concerns.  He did have understandable anticipatory anxiety leading up to this week's scans.  He noted that preliminary results show that maybe some of the spots are shrinking a  little and while feeling relieved, I'm not going to get too excited or anything until I talk with Dr. Nedra Hai.  No emergent psych concerns noted.  ??  Mr. 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  ??  Pt??continues to take Cymbalta as prescribed.  ??  I provided??Mr. Samaras with??support and understanding, and we??discussed and processed??his??thoughts and feelings??again in some depth.????We problem-solved??and reviewed approaches to??address??and??tolerate??distress and depressive symptoms, to enhance his??adaptive coping, and to achieve his stated goals. ??Mr. Paget??was engaged and receptive as always.  ??  Mental Status Exam:  Appearance:  ?? ??Appears stated age   Motor: ?? ??No abnormal movements   Speech/Language:  ?? ??Normal rate, volume, tone, fluency   Mood: ?? ??pretty good   Affect: ?? ??engaged; pleasant   Thought process: ?? ??Logical, linear, clear, coherent, goal directed   Thought content: ?? ?? ??Denies SI/HI??or thoughts of??self-harm   Perceptual disturbances: ?? ?? ??Behavior not concerning for response to internal stimuli  ??   Orientation: ?? ??Oriented to person, place, time, and general circumstances   Attention: ?? ??Able to fully attend without fluctuations in consciousness   Concentration: ?? ??Able to fully concentrate and attend   Memory: ?? ??Immediate, short-term, long-term, and recall grossly intact   ??Fund of knowledge:  ?? ??Consistent with level of education and development   Insight: ?? ?? ??Intact   Judgment:  ?? ??Intact   Impulse Control: ?? ??Intact   ??  Diagnosis: Recurrent major depressive disorder;  Anxiety associated with cancer diagnosis  ??  [name]. Mireya Meditz PhD  April 8,??2022

## 2020-04-22 MED ORDER — ONDANSETRON HCL 8 MG TABLET
ORAL | 0 days
Start: 2020-04-22 — End: ?

## 2020-04-25 MED FILL — MEKTOVI 15 MG TABLET: ORAL | 30 days supply | Qty: 180 | Fill #6

## 2020-04-25 MED FILL — BRAFTOVI 75 MG CAPSULE: ORAL | 30 days supply | Qty: 180 | Fill #6

## 2020-04-25 NOTE — Unmapped (Signed)
Taishaun Levels [facility] 's Entire shipment will be sent out  as a result of the medication is too soon to refill until 04/25/20.     I have reached out to the patient  at [phone] and communicated the delivery change. We will reschedule the medication for the delivery date that the patient agreed upon.  We have confirmed the delivery date as 04/26/20, via ups.

## 2020-05-04 NOTE — Unmapped (Signed)
OUTPATIENT ONCOLOGY PALLIATIVE CARE    Spoke with Mr. Van to assess appetite, insomnia and nausea. He reports feeling a lot better. Has not had nausea/gagging reflex in the past 2.5 weeks and has only needed zofran once. He reports sleeping has gotten a lot better and appetite is a little better. He started mirtazepine 7.5 mg nightly approximately 2-3 days ago.     Also reports needing an updated prescription for his memantine from [facility] after recent titration. Currently has memantine 5 mg tablets (takes 2 tablets twice a day), but needs 10 mg tablets per pharmacy since insurance won't cover quantity using 5 mg tablets. Had to go 3 days without memantine. Recently picked up a new fill but had to pay ~$30 and currently has ~24 day supply right now.    Plan:  - Continue mirtazepine 7.5 mg nightly   - Continue Zofran PRN nausea  - Will reach out to [facility] to send a new memantine 10mg  twice daily prescription to [facility] in [place], [place]    Follow-up: Clinic with Burtis Junes, FNP in palliative clinic and Lendell Caprice MD with Oncology on 4/27    Time spent on call with patient: 8 minutes    Jacqueline Dela Pe??a, PharmD  PGY-2 Oncology Pharmacy Resident

## 2020-05-05 MED ORDER — MEMANTINE 10 MG TABLET
ORAL_TABLET | Freq: Two times a day (BID) | ORAL | 11 refills | 30 days | Status: CP
Start: 2020-05-05 — End: 2021-05-05

## 2020-05-05 NOTE — Unmapped (Signed)
Thanks for the message, I sent the Rx.  Kenneth Fitzgerald

## 2020-05-05 NOTE — Unmapped (Signed)
Addended by: Janell Quiet on: 05/05/2020 03:24 PM     Modules accepted: Orders

## 2020-05-09 NOTE — Unmapped (Signed)
Assessment/Plan:    1. T1N1b papillary thyroid cancer (classic/follicular variant), multifocal, s/p 3 surgeries and 2 doses of I-131. Last treatment was in 2015. Has indeterminate response to treatment based on persistent detectable Tg (< 1 on suppression, rose to 2.4 with stimulation in Jan 2018).  Note that he also has stage IV melanoma, followed by oncology.  -Thyrogen-stimulated WBS was negative in 01/2016; stimulated Tg was 2.4  -Tg on suppression has been stable, last check 08/2019, 0.3 at that time with negative antibody.  Repeat Tg/Ab ~ 08/2020.  -10/2018 neck US stable. Repeat US ~ end of 2022.    2. Hypothyroidism, with TSH fluctuating between 0.06 and 23 in 2017-2018 on same doses of thyroid hormone, perhaps related to wt changes, absorption issues, and/or variable adherence at that time  - TFTs less labile from 2018 to 2021, even in the setting of bowel obstruction in 2020  - TSH much higher in 12/2019, and FT4 lower, perhaps due to interim weight gain and higher thyroid hormone needs. Has been on higher dose since then. Repeat TFTs today and adjust dose further if needed. **Follow both TSH and FT4 from now on re: brain XRT x 10 days from 12/2019-01/2020  -Reviewed appropriate administration, and factors that can affect dose needs or laboratory measurement    3. Hypocalcemia, severe, started calcitriol 05/2015. Low calcium in 12/2017 d/t low calcium intake at that time. Normal level in 05/2018 and in 06/2018 off of calcitriol, but significantly low again in 07/2018. Has been back on calcitriol 0.25 mg/day since then.  -Last few Ca checks stable and at or close to goal, no significant symptoms, but he has been out of calcitriol 0.25 mcg daily for 1.5 weeks for unclear reasons (Rx up to date)  -Repeat Ca/albumin/Phos today   ???Reviewed goals of treatment and symptoms or signs with which to take extra calcium and/or notify us.     4. Hyperglycemia, new in 03/2018, in the setting of 4 weeks of prednisone to treat a reaction to immunotherapy.  A1c 6.0% in 06/2018.  -A1c 08/2019 improved, started MTF at that time for possible help with wt loss and interest in preventing progression to frank DM, has been off since 12/2019 due to significant weight loss (and GI symptoms) following brain XRT in 12/2019  - Repeat A1c today. Glucose values on recent panels have been normal. Off dexamethasone BID (which he was taking for n/v) for a few weeks now.    There are no Patient Instructions on file for this visit.  Return in about 3 months (around 08/09/2020).     Orders Placed This Encounter   Procedures   ??? TSH   ??? T4, Free   ??? Calcium   ??? Albumin   ??? Phosphorus Level   ??? Creatinine   ??? Hemoglobin A1c             Reason For Visit:   Chief Complaint   Patient presents with   ??? Follow-up thyroid cancer and hypoparathyroidism      Subjective:     History of Present Illness:  Kenneth Fitzgerald is a 45 y.o. male with PTC who was last seen 12/2019.     ?? 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 Had hernia surgery during the hospital stay as well (3rd or 4th time it has been operated on).     Wt is down 30-40 lbs in last few months. Attributes to changes to taste following XRT and smaller portions and less soft drinks. Also has nausea and dry heaves after XRT (was on dexamethasone BID and Zofran prn). GI symptoms have improved now and he's been off the dexamethasone and Zofran for a few weeks. Trying hard to stay hydrated. Just started Remeron to help with sleep. Back on the oral targeted therapy for the melanoma. Sees Onc later today.    Taking LT4 250 mcg x 2 days/week (Sat and Sun), and 375 mcg x 5 days/week. Good adherence. No biotin supplements. No overt hypothyroid or hyperthyroid symptoms. No neck pain, swelling, globus, dysphagia, or dysphonia.    Taking calcitriol 0.25 mcg/day. Has been out of this 1.5 weeks as he has not gotten refill. Taking supplemental calcium 1000 mg/day.  Dietary calcium - 1-2 servings. Has vit D in his ca supplements. No kidney stones. No recent significant hypoCa symptoms despite being out of calcitriol.      Stopped MTF 500 mg daily re: taking too many pills per day and also GI symptoms from XRT    Weight trend:  Wt Readings from Last 6 Encounters:   05/10/20 (!) 131.5 kg (290 lb)   04/19/20 (!) 132.3 kg (291 lb 9.6 oz)   04/19/20 (!) 132.5 kg (292 lb)   03/22/20 (!) 133.6 kg (294 lb 9.6 oz)   03/01/20 (!) 139.7 kg (307 lb 14.4 oz)   02/15/20 (!) 141.1 kg (311 lb)       PMH:  1. 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 Thyroidectomy 06/25/2011 with 1 foci each lobe and 2/2 central LN (left Level 6)     1b. Thyrogen-based RAI 125 mCi 03/2012 with pre/post scans showing fairly large focus of bed activity and 4.2% uptake; pretx stimulated Tg of 73     1c. Recurrence on [place] 09/2012 in left thyroid bed and confirmed by FNA 10/2012; left ND in 11/2012 levels 3-6 with 12/16 positive (largest 1.8 cm, and some with extranodal extension)     1d. Right CND 03/2013 levels 2-4 with 3/20 positive (largest 3.5 mm), found incidentally as the right ND was performed for melanoma recurrence which was 1 cm node in 1 of 20 nodes     1e. Thyrogen-based 150 mCi in 05/2013 [LID as well before the treatment]      21f. Thyrogen-stimulated Tg was 2.4 in 01/2016. WBS in 01/2016 was negative.  2. Postsurgical hypothyroidism  3. Stage 3 melanoma - scalp lesion, surgery including RND, and systemic therapy ending 01/2012; recurrence in a 1 cm right LN removed 03/2013 on repeat RND; XRT completed 06/2013  4. Diverticulitis  5. H/o anklyosing spondylitis 2009  6. Small bowel obstruction, hospitalization 11/2018, due to adhesions from past abdominal surgeries including hernia repairs  7. Elevated A1c of 6% in 06/2018  8. Crohns, diagnosed 09/2019 by Dr. Stevphen Rochester in GI, but likely present since 2019 per note, defer immunosuppressive therapy re: active melanoma, start w sulfasalazine     PSH reviewed in Epic      Current Outpatient  Medications:   ???  acetaminophen (TYLENOL) 500 MG tablet, Take 2 tablets (1,000 mg total) by mouth every eight (8) hours as needed for pain., Disp: 30 tablet, Rfl: 0  ???  aluminum-magnesium hydroxide-simethicone (MAALOX PLUS) 200-200-20 mg/5 mL Susp, Mix equal parts diphenhydramine, lidocaine, and liquid antacid. Swish 5 to in mouth for 60 seconds, every 4 to 6 hours as needed, then swallow or spit mixture out (as directed by prescriber). Shake well before using. Refrigerate after mixing and discard after 14 days., Disp: 355 mL, Rfl: 0  ???  binimetinib ([name]) 15 mg tablet, Take 3 tablets (45 mg total) by mouth Two (2) times a day., Disp: 180 tablet, Rfl: 11  ???  blood sugar diagnostic Strp, Dispense 100 blood glucose test strips, ok to sub any brand preferred by insurance/patient, use 3x/day; dispense whatever brand matches with meter., Disp: 100 strip, Rfl: 12  ???  blood-glucose meter kit, Use as instructed; dispense 1 meter, whatever is preferred by insurance, Disp: 1 each, Rfl: 1  ???  calcitrioL (ROCALTROL) 0.25 MCG capsule, Take 1 capsule (0.25 mcg total) by mouth daily., Disp: 90 capsule, Rfl: 3  ???  calcium carbonate 650 mg calcium (1,625 mg) tablet, Take 1 tablet by mouth Three (3) times a day with a meal., Disp: , Rfl:   ???  diphenhydrAMINE (BENADRYL) 12.5 mg/5 mL liquid, Mix equal parts diphenhydramine, lidocaine, and liquid antacid. Swish 5 to in mouth for 60 seconds, every 4 to 6 hours as needed, then swallow or spit mixture out (as directed by prescriber). Shake well before using. Refrigerate after mixing and discard after 14 days., Disp: 118 mL, Rfl: 0  ???  DULoxetine (CYMBALTA) 30 MG capsule, TAKE 1 CAPSULE (30 MG TOTAL) BY MOUTH TWO (2) TIMES A DAY., Disp: 180 capsule, Rfl: 2  ???  encorafenib (BRAFTOVI) 75 mg capsule, Take 6 capsules (450 mg total) by mouth daily., Disp: 180 capsule, Rfl: 11  ???  fexofenadine (ALLEGRA ALLERGY) 180 MG tablet, Take 180 mg by mouth daily as needed (allergies). , Disp: , Rfl:   ???  fluticasone propionate (FLONASE) 50 mcg/actuation nasal spray, 2 sprays into each nostril two (2) times a day., Disp: 16 g, Rfl: 6  ???  folic acid (FOLVITE) 1 MG tablet, Take 1 tablet (1 mg total) by mouth daily., Disp: 30 tablet, Rfl: 11  ???  ibuprofen (ADVIL,MOTRIN) 200 MG tablet, Take 600 mg by mouth daily as needed for pain., Disp: , Rfl:   ???  lancets Misc, Dispense 100 lancets, ok to sub any brand preferred by insurance/patient, use 3x/day, Disp: 100 each, Rfl: 12  ???  levothyroxine (SYNTHROID) 125 MCG tablet, Take by mouth daily. Take 2 tablets (250 mcg total) every Sat and Sun. Take 3 tablets (375 mcg total) every Mon, Tue, Wed, Thur and Fri., Disp: , Rfl:   ???  lidocaine 2% viscous (XYLOCAINE) 2 % Soln, Mix equal parts diphenhydramine, lidocaine, and liquid antacid. Swish 5 to in mouth for 60 seconds, every 4 to 6 hours as needed, then swallow or spit mixture out (as directed by prescriber). Shake well before using. Refrigerate after mixing and discard after 14 days., Disp: 100 mL, Rfl: 0  ???  melatonin 3 mg Tab, Take 1 tablet (3 mg total) by mouth every evening., Disp: 90 tablet, Rfl: 1  ???  memantine (NAMENDA) 10 MG tablet, Take 10 mg by mouth daily., Disp: , Rfl:   ???  memantine (NAMENDA) 10 MG tablet, Take 1 tablet (  10 mg total) by mouth Two (2) times a day., Disp: 60 tablet, Rfl: 11  ???  mirtazapine (REMERON) 7.5 MG tablet, Take 1 tablet (7.5 mg total) by mouth nightly., Disp: 30 tablet, Rfl: 0  ???  sulfaSALAzine (AZULFIDINE) 500 mg tablet, Take 2 tablets (1000mg ) by mouth twice daily for 7 days, then take 4 tablets (2000mg ) twice daily thereafter., Disp: 240 tablet, Rfl: 11      Allergies   Allergen Reactions   ??? Compazine [Prochlorperazine] Itching   ??? Coconut Nausea And Vomiting   ??? Multihance [Gadobenate Dimeglumine] Other (See Comments)     Patient sneezed immediately after administration of contrast.       ROS  No chest pain or SOB. No f/c.  No cough.  No fractures or falls. Remainder of 8 systems reviewed were negative except as noted in the history of present illness.    Social Hx:  -Living in [place].  -Working at [employer], a Quarry manager company, changed to desk job in 2020/07/09  -Nonsmoker    Family History   Problem Relation Age of Onset   ??? Hyperthyroidism Mother    ??? Osteoporosis Mother    ??? Arrhythmia Mother    ??? Squamous cell carcinoma Mother         basal cell vs squamous cell skin cancer   ??? Coronary artery disease Father         s/p CABG   ??? Diabetes Father    ??? Hypertension Father    ??? Prostate cancer Father    ??? Colon cancer Paternal Grandmother    ??? Thyroid disease Neg Hx    Father died 02-08-2017 with diabetes and heart disease  Mother died in 2012/07/09    Objective:     Physical Exam:  BP 130/99  - Pulse 81  - Ht 188 cm (6' 2)  - Wt (!) 131.5 kg (290 lb)  - BMI 37.23 kg/m??    BP Readings from Last 3 Encounters:   05/10/20 130/99   04/19/20 129/93   03/30/20 169/101     General appearance - Pleasant, conversant, NAD. Generalized and abdominal obesity.   Eyes -  No lid lag or stare, no lid edema  Neck - Supple w/o palpable LAD, no masses appreciated. Well healed incision from thyroidectomy.and previous LND.   Lymphatics - no cervical or supraclavicular adenopathy appreciated   Resp - clear to auscultation bilaterally  CV - normal rate, regular rhythm  Neurological - no hand tremors, 2+ upper extremity DTRs  Extremities - peripheral pulses normal, no lower extremity edema  Skin - warm, dry, no acanthosis.    Data Review:    TSH   Date Value   01/03/2020 26.788 uIU/mL (H)   08/30/2019 1.748 uIU/mL 01/28/2019 2.684 uIU/mL   10/04/2013 0.11 u[iU]/mL (L)   06/28/2013 0.28 u[iU]/mL (L)   04/27/2013 0.09 u[iU]/mL (L)     Free T4 (ng/dL)   Date Value   98/11/9145 0.88 (L)   08/30/2019 1.13   01/28/2019 1.10   10/04/2013 1.55 (H)   06/28/2013 1.40   04/27/2013 1.69 (H)     THYROGLOBULIN AB (IU/mL)   Date Value   08/30/2019 <1.8   06/22/2018 <1.8   12/17/2017 <1.8     Thyroglobulin, Tumor Marker, IA (ng/mL)   Date Value   08/30/2019 0.3 (H)   06/22/2018 0.2 (H)   12/17/2017 0.3 (H)      Lab Results   Component Value Date  CALCIUM 8.4 (L) 03/01/2020    CALCIUM 6.3 (L) 02/01/2020    CALCIUM 6.4 (L) 01/31/2020    PHOS 4.9 02/01/2020    PHOS 5.2 (H) 01/31/2020    PHOS 5.5 (H) 01/30/2020    CREATININE 0.96 03/01/2020    CREATININE 0.81 02/01/2020    CREATININE 0.78 01/31/2020    VITDTOTAL 31.0 02/27/2017    VITDTOTAL 37 12/04/2015    VITDTOTAL 34 12/12/2014    PTH 9.9 (L) 12/04/2015    PTH 12.4 12/12/2014    PTH 12 04/27/2013     Lab Results   Component Value Date    A1C 5.8 (H) 01/03/2020     Calcium values while hospitalized in 06/2016 were low at less than 7, and had low ionized calcium as well.     01/2016 Thyrogen stimulated testing  - Tg rose to 2.4    PATH from 03/2013 right neck dissection:  Amended: 03/31/2013 by Kirkland Hun, MD  Reason: Additional Information  Comment: The report is amended to add the results of a BRAF V600E immunostain.  The final diagnosis is unchanged.  Previous Signout Date: 03/30/2013  Diagnosis:  Lymph nodes, right cervical, levels 2-4, removal and partial parotidectomy  -Metastatic melanoma involving 1 out of 20 lymph nodes, with the largest diameter measuring 10 mm (1.0 cm) and no evidence of extracapsular extension (1/20)  -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, with the largest diameter measuring 3.5 mm and no evidence of extracapsular extension (3/20)   -BRAF V600E immunohistochemical staining is positive (3+ staining, 100% of cells) --> this was on LN with melanoma  -Benign parotid gland is also present  *Molecular testing:  RESULTS:  Gene Variants of Known Clinical Utility:   BRAF: No mutation detected (see note) --> A BRAF V600E immunohistochemical stain was performed on the tissue section of this case and was positive (see case [id number]). Though within validated parameters, the tumor input for sequencing in this case was low. This could result in the V600E mutant allele frequency falling below the limit of detection for the assay. [Again on LN with melanoma]  KIT: No mutation detected     Radiology:      Results for orders placed during the hospital encounter of 10/28/18   US Soft Tissue Head And Neck    Narrative EXAM: US SOFT TISSUE HEAD AND NECK  DATE: 10/28/2018 9:20 AM  ACCESSION: [id number]  DICTATED: 10/28/2018 10:48 AM  INTERPRETATION LOCATION: [facility]    CLINICAL INDICATION: 45 years old Male with thyroid cancer  - C73 - Thyroid cancer (CMS - HCC)     COMPARISON: Thyroid ultrasound from 12/17/2017 and earlier examinations    TECHNIQUE:  Ultrasound views of the thyroid were obtained using gray scale and limited color Doppler imaging.    FINDINGS:  Status post total thyroidectomy. Redemonstration of heterogeneous hypoechoic nodule in the midline, superior to the thyroidectomy bed measuring 1.1 x 1 x 0.5 cm, previously 1.1 x 1.0 x 0.6 cm. No new soft tissues are noted in the thyroidectomy bed.    Lymph nodes: No adenopathy      Impression Status post total thyroidectomy. Unchanged 1.1 cm heterogeneous nodule in the midline superior to the thyroidectomy bed. Appearance is similar since 02/02/2015, likely nonaggressive process. Subcentimeter cervical chain lymph nodes measuring up to 0.7 cm and left level 2, previously 0.4 cm.    Please see below for data measurements:    Lymph node measurements:  Right I 0.48  cm  Right II 0.18 cm  Right V 0.14 cm    Left I 0.49 cm  Left II 0.68 cm  Left III 0.36 cm  Left IV 0.48 cm  Left V 0.29 cm   [Images reviewed 10/2018]    01/2016 Whole body scan:  FINDINGS: ??There are no abnormal foci of increased radiotracer uptake identified. ??There is physiologic uptake seen in the nasopharynx, the bowel, stomach, and bladder.  Impression  No evidence of I-131 avid metastasis.    05/2013 had stimulated PET and WBS, as well as 1 week post treatment WBS     Other Medical Data:

## 2020-05-10 ENCOUNTER — Encounter: Admit: 2020-05-10 | Discharge: 2020-05-10 | Payer: PRIVATE HEALTH INSURANCE

## 2020-05-10 ENCOUNTER — Encounter
Admit: 2020-05-10 | Discharge: 2020-05-10 | Payer: PRIVATE HEALTH INSURANCE | Attending: Hematology & Oncology | Primary: Hematology & Oncology

## 2020-05-10 DIAGNOSIS — E890 Postprocedural hypothyroidism: Principal | ICD-10-CM

## 2020-05-10 DIAGNOSIS — C73 Malignant neoplasm of thyroid gland: Principal | ICD-10-CM

## 2020-05-10 DIAGNOSIS — R739 Hyperglycemia, unspecified: Principal | ICD-10-CM

## 2020-05-10 DIAGNOSIS — C439 Malignant melanoma of skin, unspecified: Principal | ICD-10-CM

## 2020-05-10 DIAGNOSIS — E278 Other specified disorders of adrenal gland: Principal | ICD-10-CM

## 2020-05-10 LAB — CBC W/ AUTO DIFF
BASOPHILS ABSOLUTE COUNT: 0 10*9/L (ref 0.0–0.1)
BASOPHILS RELATIVE PERCENT: 0.3 %
EOSINOPHILS ABSOLUTE COUNT: 0.2 10*9/L (ref 0.0–0.5)
EOSINOPHILS RELATIVE PERCENT: 2.7 %
HEMATOCRIT: 35.2 % — ABNORMAL LOW (ref 39.0–48.0)
HEMOGLOBIN: 11.7 g/dL — ABNORMAL LOW (ref 12.9–16.5)
LYMPHOCYTES ABSOLUTE COUNT: 1.3 10*9/L (ref 1.1–3.6)
LYMPHOCYTES RELATIVE PERCENT: 19.9 %
MEAN CORPUSCULAR HEMOGLOBIN CONC: 33.2 g/dL (ref 32.0–36.0)
MEAN CORPUSCULAR HEMOGLOBIN: 25 pg — ABNORMAL LOW (ref 25.9–32.4)
MEAN CORPUSCULAR VOLUME: 75.3 fL — ABNORMAL LOW (ref 77.6–95.7)
MEAN PLATELET VOLUME: 8.3 fL (ref 6.8–10.7)
MONOCYTES ABSOLUTE COUNT: 0.4 10*9/L (ref 0.3–0.8)
MONOCYTES RELATIVE PERCENT: 5.8 %
NEUTROPHILS ABSOLUTE COUNT: 4.5 10*9/L (ref 1.8–7.8)
NEUTROPHILS RELATIVE PERCENT: 71.3 %
PLATELET COUNT: 269 10*9/L (ref 150–450)
RED BLOOD CELL COUNT: 4.67 10*12/L (ref 4.26–5.60)
RED CELL DISTRIBUTION WIDTH: 20.4 % — ABNORMAL HIGH (ref 12.2–15.2)
WBC ADJUSTED: 6.3 10*9/L (ref 3.6–11.2)

## 2020-05-10 LAB — COMPREHENSIVE METABOLIC PANEL
ALBUMIN: 3.8 g/dL (ref 3.4–5.0)
ALKALINE PHOSPHATASE: 86 U/L (ref 46–116)
ALT (SGPT): 29 U/L (ref 10–49)
ANION GAP: 10 mmol/L (ref 5–14)
AST (SGOT): 24 U/L (ref <=34)
BILIRUBIN TOTAL: 0.2 mg/dL — ABNORMAL LOW (ref 0.3–1.2)
BLOOD UREA NITROGEN: 15 mg/dL (ref 9–23)
BUN / CREAT RATIO: 16
CALCIUM: 7.6 mg/dL — ABNORMAL LOW (ref 8.7–10.4)
CHLORIDE: 107 mmol/L (ref 98–107)
CO2: 24 mmol/L (ref 20.0–31.0)
CREATININE: 0.93 mg/dL
EGFR CKD-EPI AA MALE: >90 mL/min/{1.73_m2} (ref >=60)
EGFR CKD-EPI NON-AA MALE: >90 mL/min/{1.73_m2} (ref >=60)
GLUCOSE RANDOM: 116 mg/dL (ref 70–179)
POTASSIUM: 3.5 mmol/L (ref 3.4–4.8)
PROTEIN TOTAL: 6.8 g/dL (ref 5.7–8.2)
SODIUM: 141 mmol/L (ref 135–145)

## 2020-05-10 LAB — TSH: THYROID STIMULATING HORMONE: 9.387 u[IU]/mL — ABNORMAL HIGH (ref 0.550–4.780)

## 2020-05-10 LAB — T4, FREE: FREE T4: 0.94 ng/dL (ref 0.89–1.76)

## 2020-05-10 LAB — HEMOGLOBIN A1C
ESTIMATED AVERAGE GLUCOSE: 108 mg/dL
HEMOGLOBIN A1C: 5.4 % (ref 4.8–5.6)

## 2020-05-10 LAB — PHOSPHORUS: PHOSPHORUS: 5.8 mg/dL — ABNORMAL HIGH (ref 2.4–5.1)

## 2020-05-10 MED ORDER — CALCITRIOL 0.25 MCG CAPSULE
ORAL_CAPSULE | Freq: Every day | ORAL | 3 refills | 90 days | Status: CP
Start: 2020-05-10 — End: ?

## 2020-05-10 NOTE — Unmapped (Signed)
PRIMARY CARE PHYSICIAN  TIMOTHY R DRAPER, DO  [address]  [place] [address]    CONSULTING PHYSICIANS  Ralene Cork, Do  [address]  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   S/p thyroidectomy 06/2011 and radioactive iodine 03/2012 and again 05/2013.    Thyroid CA recurrence 11/2012 s/p L neck dissecion 12/07/2012.  2nd thyroid CA recurrence 03/2013 AND co-existing 1st melanoma recurrence 03/2013 s/p R cervical level 2-4 node dissection and partial parotidectomy  S/p radiation to R neck, completed 06/2013  Nivolumab (02/12/18 x 2 cycles) complicated by colitis that required a 6 week steroid taper.     CURRENT THERAPY:   BRAF/MEK started early May 2020    ASSESSMENT: [name] is a pleasant 45 y.o. male who presents today for further evaluation of recurrent stage IV melanoma.  He completed adjuvant high dose IFN in Jan 2014. Since then, thyroid CA has recurred twice and melanoma recurred locally and then distantly. Now w/metastatic disease in abdominal/pelvic LN, on BRAFtovi/[name].    PLAN  1. Stage IV melanoma: BRAF+ by IHC. Dr Eyvonne Left reviewed path: TILS present, non-brisk; lymphocytes are 20 to 30% the number of melanoma cells.  Known sites of disease include abdominal/pelvic, and brain. Started dabrafenib/trametinib mid-May 2020, has had stable disease.      -Reviewed path from hernia surgery, noted that the abdominal wall/peritoneal implants contained melanophages, but no melanoma cells.  We discussed that this is interesting, good to know that there wasn't melanoma in the biopsy, is in keeping w/immune response.  That said, still need close monitoring.    - He is back on BRAF/MEK targeted therapy.  Will continue.   - He is s/p WBRT, completed 01/20/20. MRI brain on April 19, 2020 personally reviewed and c/w stable disease.  Rad/onc is following.  - I personally reviewed CT chest from 04/19/20 which is NED, abdominal MRI w/no clear progression of disease.  Will CTM the psoas finding.  It is only 1cm.   -clinically, he is doing very well, much improved compared to last visit.   -RTC in 8 weeks for clinical follow up and repeat imaging.  -Of note, we have attempted to confirm BRAF on NGS twice, on original primary specimen from July 2012 and and pelvic LN sample from 01/09/2018.  Both samples were insufficient tissue for analysis.  At this point, BRAF is known by IHC only.     #.) Brain mets; s/p WBRT as per above.  Brain MRI from 04/19/20, NED. Dry heaves and nausea much improved w/flonase.  No longer on steroid taper.     #) L ear fullness, hearing loss; will reach out to ENT for further recs.     #.) Atypical Crohn's; recent colonoscopy w/ulcerations and crypt abscesses.  Sulfasalazine started.      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 During hospital stay, it was determined that this was malignant thrombus and no AC recommended.  Will follow.     #.) Hernia repair; all scars healing well and he is eating and drinking normally.  Surgeon aware abdominal wall finding, CTM.     #.) Thyroid CA, recurrent. Well controlled now. Followed by endocrinology for thyroid and Ca+ replacement and ongoing monitoring of glucose.    #.) Mood; excessive stress related to h/o  2 malignancies, job loss and legal issues surrounding, parents both passed away in recent years.   He follows w/ Dr. Willaim Bane.     #.) HypoCA; he struggles w/compliance during the workday,cont Ca supplementation    #.) Supportive care/GOC;appreciate pall care support and med titration.      INTERVAL HISTORY: This is a return visit to the [facility] for further evaluation of melanoma.  Doing MUCH better than last visit.  No more dry heaving, nausea in 3 weeks.   L ear feels plugged.  ENT recommended flonase, allegra.   Nausea improved w/flonase, for unclear reasons.    But these drugs haven't helped w/L ear fullness and it is very bothersome.  Had increased HA/scalp pain much better.   Remeron knocks him out.  Takes it at 7-8pm.   Has some twitching in UE/LE when in a relaxed state.  Has been going on for a while.  Fatigue is improved. Is back to dispatching 40hrs/week.  Did some hiking last week.  Is in a wedding on 8/7.   Appetite improved, weight stabilizing. Is trying to lose weight.   Wants to go ziplining.   Bowels are normal for him; recent diagnosis of atypical crohn's disease, started on sulfasalazine 2 tabs BID  Follows w/Dr. Willaim Bane and Yopp, finds this helpful.  Usually does 2 glasses/milk and 1500mg  Ca+      PAST MEDICAL HISTORY:   1. Diverticulitis.   2. History of ankylosing spondylitis in 2009. Please see past medical history dictated on 11/15/2010 for details. In brief, he was treated w/Enbrel and prednisone for 6mos or so.  Symptoms improved, immunosuppressants were stopped and symptoms did not recur.   3. S/p incisional hernia repair       SOCIAL HISTORY:    reports that he has never smoked. He has never used smokeless tobacco. He reports current alcohol use. He reports that he does not use drugs.      MEDICATIONS:   Current Outpatient Medications   Medication Sig Dispense Refill   ??? acetaminophen (TYLENOL) 500 MG tablet Take 2 tablets (1,000 mg total) by mouth every eight (8) hours as needed for pain. 30 tablet 0   ??? aluminum-magnesium hydroxide-simethicone (MAALOX PLUS) 200-200-20 mg/5 mL Susp Mix equal parts diphenhydramine, lidocaine, and liquid antacid. Swish 5 to in mouth for 60 seconds, every 4 to 6 hours as needed, then swallow or spit mixture out (as directed by prescriber). Shake well before using. Refrigerate after mixing and discard after 14 days. 355 mL 0   ??? binimetinib ([name]) 15 mg tablet Take 3 tablets (45 mg total) by mouth Two (2) times a day. 180 tablet 11   ??? blood sugar diagnostic Strp Dispense 100 blood glucose test strips, ok to sub any brand preferred by insurance/patient, use 3x/day; dispense whatever brand matches with meter. 100 strip 12   ??? blood-glucose meter kit Use as instructed; dispense 1 meter, whatever is preferred by insurance 1 each 1   ??? calcitrioL (ROCALTROL) 0.25 MCG capsule Take 1 capsule (0.25 mcg total) by mouth daily. 90 capsule 3   ??? calcium carbonate 650 mg calcium (1,625 mg) tablet Take 1 tablet by mouth Three (3) times a day with a meal.     ??? diphenhydrAMINE (BENADRYL) 12.5 mg/5 mL liquid Mix equal parts diphenhydramine, lidocaine, and liquid antacid. Swish 5 to in mouth for 60 seconds, every 4 to 6 hours as needed, then swallow or spit mixture out (as  directed by prescriber). Shake well before using. Refrigerate after mixing and discard after 14 days. 118 mL 0   ??? DULoxetine (CYMBALTA) 30 MG capsule TAKE 1 CAPSULE (30 MG TOTAL) BY MOUTH TWO (2) TIMES A DAY. 180 capsule 2   ??? encorafenib (BRAFTOVI) 75 mg capsule Take 6 capsules (450 mg total) by mouth daily. 180 capsule 11   ??? fexofenadine (ALLEGRA ALLERGY) 180 MG tablet Take 180 mg by mouth daily as needed (allergies).      ??? fluticasone propionate (FLONASE) 50 mcg/actuation nasal spray 2 sprays into each nostril two (2) times a day. 16 g 6   ??? folic acid (FOLVITE) 1 MG tablet Take 1 tablet (1 mg total) by mouth daily. 30 tablet 11   ??? ibuprofen (ADVIL,MOTRIN) 200 MG tablet Take 600 mg by mouth daily as needed for pain.     ??? lancets Misc Dispense 100 lancets, ok to sub any brand preferred by insurance/patient, use 3x/day 100 each 12   ??? levothyroxine (SYNTHROID) 125 MCG tablet Take by mouth daily. Take 2 tablets (250 mcg total) every Sat and Sun. Take 3 tablets (375 mcg total) every Mon, Tue, Wed, Thur and Fri.     ??? lidocaine 2% viscous (XYLOCAINE) 2 % Soln Mix equal parts diphenhydramine, lidocaine, and liquid antacid. Swish 5 to in mouth for 60 seconds, every 4 to 6 hours as needed, then swallow or spit mixture out (as directed by prescriber). Shake well before using. Refrigerate after mixing and discard after 14 days. 100 mL 0   ??? melatonin 3 mg Tab Take 1 tablet (3 mg total) by mouth every evening. 90 tablet 1   ??? memantine (NAMENDA) 10 MG tablet Take 10 mg by mouth daily.     ??? memantine (NAMENDA) 10 MG tablet Take 1 tablet (10 mg total) by mouth Two (2) times a day. 60 tablet 11   ??? mirtazapine (REMERON) 7.5 MG tablet Take 1 tablet (7.5 mg total) by mouth nightly. 30 tablet 0   ??? sulfaSALAzine (AZULFIDINE) 500 mg tablet Take 2 tablets (1000mg ) by mouth twice daily for 7 days, then take 4 tablets (2000mg ) twice daily thereafter. 240 tablet 11     No current facility-administered medications for this visit.       REVIEW OF SYSTEMS  A complete review of systems was obtained and was negative except for those symptoms listed in the HPI     PHYSICAL EXAM  There were no vitals filed for this visit.    GEN: Awake and alert, pleasant appearing male in no acute distress  LUNGS: normal WOB  CV: NAD  ABD; obese, soft NTND  SKIN: No rashes, petechiae or jaundice noted.  All melanoma scarlines examined; no nodularity.    PYSCH: Alert and oriented to person, place and time  EXT: No edema noted of the lower extremity noted.          LABS  Lab Results   Component Value Date    WBC 6.2 03/01/2020    HGB 11.7 (L) 03/01/2020    HCT 39.2 (L) 03/01/2020    PLT 297 03/01/2020       Lab Results   Component Value Date    NA 141 03/01/2020    K 3.8 03/01/2020    CL 106 03/01/2020    CO2 26.0 03/01/2020    BUN 12 03/01/2020    CREATININE 0.96 03/01/2020    GLU 99 03/01/2020    CALCIUM 8.4 (L) 03/01/2020  MG 1.8 02/01/2020    PHOS 4.9 02/01/2020       Lab Results   Component Value Date    BILITOT 0.3 03/01/2020    BILIDIR 0.20 01/27/2020    PROT 6.7 03/01/2020    ALBUMIN 3.8 03/01/2020    ALT 9 (L) 03/01/2020    AST 18 03/01/2020    ALKPHOS 64 03/01/2020    GGT 42 08/22/2011       Lab Results   Component Value Date    INR 1.12 01/24/2020 RADIOLOGY RESULTS;  CT chest 04/19/20:  IMPRESSION:  ??  No evidence of new thoracic metastasis.      Abd MRI 03/22/20:    IMPRESSION:  1.There are postsurgical changes along the midline anterior wall. There is a 7.5 cm rim-enhancing fluid collection noted along the left aspect of the surgical incision site that abuts the left rectus abdominis muscle located approximately 4.5 cm beneath the skin surface. The sterility of this collection is unknown. Clinical condition is recommended but hematoma with surrounding chronic inflammatory changes is favored  2.Unchanged size and number of metastatic peritoneal implants. No new lesions are identified.  3.New peripherally rim-enhancing lesion in the left psoas muscle. This is worrisome for an metastasis versus less likely a tiny abscess. Clinical correlation is recommended. Short-term follow-up is recommended. Alternatively further assessment with PET/CT could be considered.  4.Chronic and incidental findings as detailed above.    Brain MRI 04/19/20:  IMPRESSION:  Innumerable micrometastases decreased somewhat in number and conspicuity. No definitive new or enlarging lesions.      CT chest 03/22/20:  IMPRESSION:  No thoracic metastatic disease.      PET 05/12/19:  IMPRESSION:  - The sclerotic lesion with central lucency in the L2 vertebral body which displays enhancement on recent MRI does not show any evidence of FDG uptake and is favored to be benign.  ??  -Multiple enlarged hypermetabolic abdominal and pelvic lymph nodes and peritoneal implants concerning for metastases, grossly unchanged since MRI dated 04/29/2019.  ??  -Interval increase in the size of the focal uptake with corresponding soft tissue lesion in the mid transverse colon. Recommend further evaluation with colonoscopy.    Echo 04/29/19:  Summary    1. The left ventricle is normal in size with normal wall thickness.    2. The left ventricular systolic function is normal, LVEF is visually  estimated at 55%.    3. The right ventricle is upper normal in size, with normal systolic  function.          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  B: Lymph node, pelvic, core needle biopsy  - Metastatic melanoma  - See comment  ??     FNA R femur 07/19/14:  : Bone, right femur, core biopsy with touch imprints  - No primary or metastatic malignancy identified  - Bone with focal osteocyte dropout, suggestive of necrosis  - Bone marrow with trilineage hematopoiesis and focal effacement by chronic inflammation (see comment)    B: Bone, right femur, fine needle aspiration  - Few atypical cells present on direct smears (see comment)  - Scant fragments of edematous stromal tissue in cell block  - Bone marrow with trilineage hematopoiesis    Diagnosis:  Lymph nodes,  right cervical, levels 2-4, removal and partial parotidectomy  -Metastatic melanoma involving 1 out of 20 lymph nodes, with the largest  diameter measuring 10 mm (1.0 cm) and no evidence of extracapsular extension  (1/20)  -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

## 2020-05-10 NOTE — Unmapped (Signed)
See me back in 8 weeks w/scans.   For appointments & questions Monday through Friday 8 AM???5 PM     Please call [phone] or Toll free [phone]    For urgent clinical needs on Nights, Weekends or Holidays  Call [phone] and ask for the oncologist on call.     For appointment changes please contact during normal business hours.     Please visit [email or web address], a resource created just for family members and caregivers.  This website lists support services, how and where to ask for help. It has tools to assist you as you help [place] care for your loved one.    [facility]  [address]  [place], [place] [address]  [email or web address]

## 2020-05-10 NOTE — Unmapped (Unsigned)
OUTPATIENT ONCOLOGY PALLIATIVE CARE    Principal Diagnosis: Kenneth Fitzgerald is a 45 y.o. 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 Disease sites include metastases to brain and abdominal/pelvic lymph nodes.     Assessment/Plan:   1. Fatigue/Insomnia: improved  -Continue  Mirtazapine 7.5 mg nightly to target insomnia, appetite, and sleep.     2. Left ear fullness-has tried all the interventions by the ENT team, but no relief.  It is affecting his hearing as well.  -Will connect with Dr. Manfred Shirts for follow-up visit    3. Depression/Anxiety: stable  -Continue Cymbalta 30 mg twice daily per psychiatry  -Continue Mirtazapine 7.5 mg daily    4. Coping: Has support of his family and friends.  Looking forward to an upcoming wedding in August.  Active with [facility] with both Dr. Electa Sniff and Dr. Willaim Bane.  -Monitor    5. Advance Care Planning:   -Shares doesn't want to have to much optimism.  Comments that he is feeling well now, but he is aware that things can change.  Grateful for this time of feeling well.      At prior visits: Patient has an old advance directive scanned into chart, which has his father listed as his healthcare decision maker (who is now deceased).  He states that his sister, Kenneth Fitzgerald, is now his designated Oceanographer.  He has been given a copy of the [place] advance directive (prepare for your care) at a prior visit.  Plan:  -Will follow up at future visit on Kenneth Fitzgerald's thoughts and willingness to complete another advanced directive    F/u: 6/8 clinic with Dr. Nedra Hai    ----------------------------------------  Referring Provider: Dr. Lendell Caprice  Oncology Team: Dr. Nedra Hai, Dr. Despina Hick  PCP: Ralene Cork, DO      HPI:  45 year old white male with metastatic melanoma (Mets to lymph nodes and brain), history of thyroid cancer, history of portal vein thrombosis, and depression who presents for initial palliative care visit.  Dr. Nedra Hai with heme-onc referred patient for symptom management.  Patient also follows with Dr. Willaim Bane and Dr. Electa Sniff for his depression and anxiety.  Patient reports that in general he has been doing okay, but he has had decreased energy and fatigue.  Patient notes he works approximately 60 to 70 hours/week.  His fatigue has been worsening over the last few months.  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 In regards to patient's prognosis, he states that he doesn't want to know timeline unless his death is imminent.    Current cancer-directed therapy: Arva Chafe      Interval history, 04/19/20 - In person, VR + [name]  - Admitted for SBO 1/4 - 02/02/20, Open primary ventral hernia repair. BMs now regular; no constipation.  - Recently finished course of dexamethasone (taper finished this past weekend) for headaches related to brain mets; states headaches have not been bothering him recently  - Mood/anxiety: follows with [facility]; prescribed duloxetine 30 mg BID  - Nausea/dizziness: Has occasional dizzy spells. Also has episodes of nausea that lead to dry heaving. Feels this episodes are related to mucus pockets in sinus. States these episodes are sporadic and has not noticed any pattern provoking episodes. Some days will have no episodes; other days will have multiple episodes per day. States happens  on average 3 days/week and each episode lasts 15-20 min. States hard to tell if Zofran helping.   - Fatigue/insomnia: feels he has not been sleeping well for the past few months; falls asleep but has trouble staying asleep. Does report that his energy has been gradually improving since hospitalization.  - Appetite: feels like this has decreased in the last few months; eating less than he used to  - Coping/support: Recently discussed prognosis with Dr. Nedra Hai; states he is doing okay processing information; connected to [facility] for support as well    Interval hx 05/10/20 CK, [name] and Dr. Nedra Hai    -No sickness in the last 3.5 wks. Feeling much better.  -Left ear is still plugged (like ear is full of water) and has been communicating with ENT and he is following their recs.  -HA and scalp pain comes and goes. Overall pain is decreased.   -Rare dizziness, esp if gets up too quickly  -Energy is getting better. Was able to go hiking.  -8/7 for a wedding.   -Trying to lose some wgt.   -Wants to go zip lining  -Breathing is ok.  -No new pains.   -Continues to work full time-45 hours/week.    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  Hobbies: playing fetch with his dog, [name] (61 year old labradoodle); plays bass in band at church every couple of weeks  Current residence / distance from [place]: [place], [place] ([place] miles from [facility])    Advance Care Planning:   HCPOA: sister, [name]  Natural surrogate decision maker: sister, [name] & brother, [name]  Living Will: yes  ACP note: no    Objective     Oncology History   Malignant melanoma, metastatic (CMS-HCC)   03/17/2013 Initial Diagnosis    Melanoma (CMS-HCC)     02/12/2018 - 03/12/2018 Chemotherapy    OP NIVOLUMAB 480 MG Q4W  nivolumab 480 mg every 28 days     Malignant neoplasm of brain, unspecified location (CMS-HCC)   12/22/2019 Initial Diagnosis    Malignant neoplasm of brain, unspecified location (CMS-HCC)     12/22/2019 -  Radiation    Radiation Therapy Treatment Details (Noted on 12/22/2019)  Site: Brain  Technique: IMRT  Goal: No goal specified  Planned Treatment Start Date: No planned start date specified         Patient Active Problem List   Diagnosis   ??? 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 Malignant neoplasm of thyroid gland (CMS-HCC)   ??? Postoperative hypothyroidism   ??? Hypocalcemia   ??? Thyroid cancer (CMS-HCC)   ??? Malignant melanoma, metastatic (CMS-HCC)   ??? Ventral hernia with bowel obstruction   ??? Insomnia   ??? Crohn's disease of large intestine without complication (CMS-HCC)   ??? Malignant neoplasm of brain, unspecified location (CMS-HCC)   ??? Gout   ??? 01/24/2020: Open, primary ventral hernia repair for SBO   ??? Morbid obesity with BMI of 40.0-44.9, adult (CMS-HCC)  Past Medical History:   Diagnosis Date   ??? Cancer (CMS-HCC)     melanoma, thyroid cancer   ??? Disease of thyroid gland     hypothyroid   ??? Hypothyroidism    ??? Skin cancer        Past Surgical History:   Procedure Laterality Date   ??? PR COLONOSCOPY W/BIOPSY SINGLE/MULTIPLE N/A 01/09/2018    Procedure: COLONOSCOPY, FLEXIBLE, PROXIMAL TO SPLENIC FLEXURE; WITH BIOPSY, SINGLE OR MULTIPLE;  Surgeon: Vonda Antigua, MD;  [facility];  Service: Gastroenterology   ??? PR COLONOSCOPY W/BIOPSY SINGLE/MULTIPLE N/A 08/24/2019    Procedure: COLONOSCOPY, FLEXIBLE, PROXIMAL TO SPLENIC FLEXURE; WITH BIOPSY, SINGLE OR MULTIPLE;  Surgeon: Leland Her, MD;  [facility];  Service: Gastroenterology   ??? PR COLSC FLX WITH DIRECTED SUBMUCOSAL NJX ANY SBST N/A 08/24/2019    Procedure: COLONOSCOPY, FLEXIBLE, PROXIMAL TO SPLENIC FLEXURE; WITH DIRECTED SUBMUCOSAL INJECTION(S), ANY SUBSTANCE;  Surgeon: Leland Her, MD;  [facility]: [facility];  Service: Gastroenterology   ??? PR EXPLORATORY OF ABDOMEN N/A 01/24/2020    Procedure: EXPLORATORY LAPAROTOMY, EXPLORATORY CELIOTOMY WITH OR WITHOUT BIOPSY(S);  Surgeon: [name], MD;  Location: MAIN OR [facility];  Service: Trauma   ??? PR FREEING BOWEL ADHESION,ENTEROLYSIS N/A 01/24/2020    Procedure: ENTEROLYSIS (SEPART PROC);  Surgeon: [name], MD;  Location: MA[facility];  Service: Trauma   ??? PR IMPLANT MESH HERNIA REPAIR/DEBRIDEMENT CLOSURE N/A 01/03/2015    Procedure: IMPLANTATION OF MESH/OTHER PROSTHES INCISION/VENTRAL HERNIA REPAIR/MESH CLOSE DEBRID NECROT SOFT TIS INFECT;  Surgeon: Romero Belling, MD;  Location: MAIN OR [place];  Service: Gastrointestinal   ??? PR LAP, VENTRAL HERNIA REPAIR,REDUCIBLE N/A 04/07/2014    Procedure: LAPAROSCOPY, SURGICAL, REPAIR, VENTRAL, UMBILICAL, SPIGELIAN OR EPIGASTRIC HERNIA, REDUCIBLE;  Surgeon: Romero Belling, MD;  Location: MAIN OR [place];  Service: Gastrointestinal   ??? PR NEGATIVE PRESSURE WOUND THERAPY DME >50 SQ CM N/A 01/24/2020    Procedure: NEG PRESS WOUND TX (VAC ASSIST) INCL TOPICALS, PER SESSION, TSA GREATER THAN/= 50 CM SQUARED;  Surgeon: [name], MD;  Location: MAIN OR [place];  Service: Trauma   ??? PR REMOVAL NODES, NECK,CERV CMPLT Left 12/07/2012    Procedure: CERVICAL LYMPHADENECTOMY (COMPLETE);  Surgeon: Charlott Rakes, MD;  Location: MA[facility] OR [facility];  Service: Surgical Oncology   ??? PR REMOVAL NODES, NECK,CERV MOD RAD Right 03/29/2013    Procedure: CERVICAL LYMPHADENECTOMY (MODIFIED RADICAL NECK DISSECTION);  Surgeon: Charlott Rakes, MD;  Location: MAIN OR [facility];  Service: Surgical Oncology   ??? PR REPAIR RECURR INCIS HERNIA,REDUC N/A 01/03/2015    Procedure: REPAIR RECURRENT INCISIONAL OR VENTRAL HERNIA; REDUCIBLE;  Surgeon: Romero Belling, MD;  Location: MAIN OR [place];  Service: Gastrointestinal   ??? PR REPAIR RECURR INCIS HERNIA,STRANG N/A 06/21/2016    Procedure: REPAIR RECURRENT INCISIONAL OR VENTRAL HERNIA; INCARCERATED OR STRANGULATED;  Surgeon: Mickle Asper, MD;  Location: MAIN OR [place];  Service: Gastrointestinal   ??? PR REPAIR RECURR INCIS HERNIA,STRANG N/A 01/24/2020    Procedure: REPAIR RECURRENT INCISIONAL OR VENTRAL HERNIA; INCARCERATED OR STRANGULATED;  Surgeon: [name], MD;  Location: MAIN OR [place];  Service: Trauma   ??? PR SIGMOIDOSCOPY,FINE NEEDL BX,US GUIDED N/A 01/09/2018    Procedure: SIGMOIDOSCOPY, FLEXIBLE, W/TRANSENDOSCOPIC ULTRASOUND GUIDED NEEDLE ASPIRATION;  Surgeon: Vonda Antigua, MD;  [facility];  Service: Gastroenterology       Current Outpatient Medications   Medication Sig Dispense Refill   ???  acetaminophen (TYLENOL) 500 MG tablet Take 2 tablets (1,000 mg total) by mouth every eight (8) hours as needed for pain. 30 tablet 0   ??? aluminum-magnesium hydroxide-simethicone (MAALOX PLUS) 200-200-20 mg/5 mL Susp Mix equal parts diphenhydramine, lidocaine, and liquid antacid. Swish 5 to in mouth for 60 seconds, every 4 to 6 hours as needed, then swallow or spit mixture out (as directed by prescriber). Shake well before using. Refrigerate after mixing and discard after 14 days. 355 mL 0   ??? binimetinib ([name]) 15 mg tablet Take 3 tablets (45 mg total) by mouth Two (2) times a day. 180 tablet 11   ??? blood sugar diagnostic Strp Dispense 100 blood glucose test strips, ok to sub any brand preferred by insurance/patient, use 3x/day; dispense whatever brand matches with meter. 100 strip 12   ??? blood-glucose meter kit Use as instructed; dispense 1 meter, whatever is preferred by insurance 1 each 1   ??? calcitrioL (ROCALTROL) 0.25 MCG capsule Take 1 capsule (0.25 mcg total) by mouth daily. 90 capsule 3   ??? calcium carbonate 650 mg calcium (1,625 mg) tablet Take 1 tablet by mouth Three (3) times a day with a meal.     ??? diphenhydrAMINE (BENADRYL) 12.5 mg/5 mL liquid Mix equal parts diphenhydramine, lidocaine, and liquid antacid. Swish 5 to in mouth for 60 seconds, every 4 to 6 hours as needed, then swallow or spit mixture out (as directed by prescriber). Shake well before using. Refrigerate after mixing and discard after 14 days. 118 mL 0   ??? DULoxetine (CYMBALTA) 30 MG capsule TAKE 1 CAPSULE (30 MG TOTAL) BY MOUTH TWO (2) TIMES A DAY. 180 capsule 2   ??? encorafenib (BRAFTOVI) 75 mg capsule Take 6 capsules (450 mg total) by mouth daily. 180 capsule 11   ??? fexofenadine (ALLEGRA ALLERGY) 180 MG tablet Take 180 mg by mouth daily as needed (allergies).      ??? fluticasone propionate (FLONASE) 50 mcg/actuation nasal spray 2 sprays into each nostril two (2) times a day. 16 g 6   ??? folic acid (FOLVITE) 1 MG tablet Take 1 tablet (1 mg total) by mouth daily. 30 tablet 11   ??? ibuprofen (ADVIL,MOTRIN) 200 MG tablet Take 600 mg by mouth daily as needed for pain.     ??? lancets Misc Dispense 100 lancets, ok to sub any brand preferred by insurance/patient, use 3x/day 100 each 12   ??? levothyroxine (SYNTHROID) 125 MCG tablet Take by mouth daily. Take 2 tablets (250 mcg total) every Sat and Sun. Take 3 tablets (375 mcg total) every Mon, Tue, Wed, Thur and Fri.     ??? lidocaine 2% viscous (XYLOCAINE) 2 % Soln Mix equal parts diphenhydramine, lidocaine, and liquid antacid. Swish 5 to in mouth for 60 seconds, every 4 to 6 hours as needed, then swallow or spit mixture out (as directed by prescriber). Shake well before using. Refrigerate after mixing and discard after 14 days. 100 mL 0   ??? melatonin 3 mg Tab Take 1 tablet (3 mg total) by mouth every evening. 90 tablet 1   ??? memantine (NAMENDA) 10 MG tablet Take 10 mg by mouth daily.     ??? memantine (NAMENDA) 10 MG tablet Take 1 tablet (10 mg total) by mouth Two (2) times a day. 60 tablet 11   ??? mirtazapine (REMERON) 7.5 MG tablet Take 1 tablet (7.5 mg total) by mouth nightly. 30 tablet 0   ??? sulfaSALAzine (AZULFIDINE) 500 mg tablet Take 2  tablets (1000mg ) by mouth twice daily for 7 days, then take 4 tablets (2000mg ) twice daily thereafter. 240 tablet 11     No current facility-administered medications for this visit.       Allergies:   Allergies   Allergen Reactions   ??? Compazine [Prochlorperazine] Itching   ??? Coconut Nausea And Vomiting   ??? Multihance [Gadobenate Dimeglumine] Other (See Comments)     Patient sneezed immediately after administration of contrast.       Family History:  Cancer-related family history includes Colon cancer in his paternal grandmother; Prostate cancer in his father.  He indicated that the status of his mother is unknown. He indicated that the status of his father is unknown. He indicated that the status of his paternal grandmother is unknown. He indicated that the status of his neg hx is unknown.      REVIEW OF SYSTEMS:  A comprehensive review of 10 systems was negative except for pertinent positives noted in HPI.    Vital signs for this encounter: VS reviewed in EPIC.  GEN: Awake and alert & in no acute distress  PSYCH: Alert and oriented to person, place and time. Euthymic.  HEENT: Pupils equally round without scleral icterus. No facial asymmetry.  LUNGS: No increased work of breathing.  SKIN: No rashes, petechiae or jaundice noted on visible skin  EXT: No edema noted of the lower extremities         Lab Results   Component Value Date    CREATININE 0.96 03/01/2020     Lab Results   Component Value Date    ALKPHOS 64 03/01/2020    BILITOT 0.3 03/01/2020    BILIDIR 0.20 01/27/2020    PROT 6.7 03/01/2020    ALBUMIN 3.8 03/01/2020    ALT 9 (L) 03/01/2020    AST 18 03/01/2020            Pam Drown, FNP-BC, [facility]  Outpatient Oncology Palliative Care Service  [facility]  [address], [place], [place] [address]  [phone]      Time spent with patient was 30 minutes.  Additional  15 minutes were spent on preparation, documenting and coordinating care. capsule 2   ??? encorafenib (BRAFTOVI) 75 mg capsule Take 6 capsules (450 mg total) by mouth daily. 180 capsule 11   ??? fexofenadine (ALLEGRA ALLERGY) 180 MG tablet Take 180 mg by mouth daily as needed (allergies).      ??? fluticasone propionate (FLONASE) 50 mcg/actuation nasal spray 2 sprays into each nostril two (2) times a day. 16 g 6   ??? folic acid (FOLVITE) 1 MG tablet Take 1 tablet (1 mg total) by mouth daily. 30 tablet 11   ??? ibuprofen (ADVIL,MOTRIN) 200 MG tablet Take 600 mg by mouth daily as needed for pain.     ??? lancets Misc Dispense 100 lancets, ok to sub any brand preferred by insurance/patient, use 3x/day 100 each 12   ??? levothyroxine (SYNTHROID) 125 MCG tablet Take by mouth daily. Take 2 tablets (250 mcg total) every Sat and Sun. Take 3 tablets (375 mcg total) every Mon, Tue, Wed, Thur and Fri.     ??? lidocaine 2% viscous (XYLOCAINE) 2 % Soln Mix equal parts diphenhydramine, lidocaine, and liquid antacid. Swish 5 to in mouth for 60 seconds, every 4 to 6 hours as needed, then swallow or spit mixture out (as directed by prescriber). Shake well before using. Refrigerate after mixing and discard after 14 days. 100 mL 0   ???  melatonin 3 mg Tab Take 1 tablet (3 mg total) by mouth every evening. 90 tablet 1   ??? memantine (NAMENDA) 10 MG tablet Take 10 mg by mouth daily.     ??? memantine (NAMENDA) 10 MG tablet Take 1 tablet (10 mg total) by mouth Two (2) times a day. 60 tablet 11   ??? mirtazapine (REMERON) 7.5 MG tablet Take 1 tablet (7.5 mg total) by mouth nightly. 30 tablet 0   ??? sulfaSALAzine (AZULFIDINE) 500 mg tablet Take 2 tablets (1000mg ) by mouth twice daily for 7 days, then take 4 tablets (2000mg ) twice daily thereafter. 240 tablet 11     No current facility-administered medications for this visit.       Allergies:   Allergies   Allergen Reactions   ??? Compazine [Prochlorperazine] Itching   ??? Coconut Nausea And Vomiting   ??? Multihance [Gadobenate Dimeglumine] Other (See Comments)     Patient sneezed immediately after administration of contrast.       Family History:  Cancer-related family history includes Colon cancer in his paternal grandmother; Prostate cancer in his father.  He indicated that the status of his mother is unknown. He indicated that the status of his father is unknown. He indicated that the status of his paternal grandmother is unknown. He indicated that the status of his neg hx is unknown.      REVIEW OF SYSTEMS:  A comprehensive review of 10 systems was negative except for pertinent positives noted in HPI.      PHYSICAL EXAM: - video visit  GEN: Awake and alert, comfortable appearing male in no acute distress  Pulm: Nonlabored respirations  Neuro: Alert, normal speech  Psych: Appropriate mood and affect    Lab Results   Component Value Date    CREATININE 0.96 03/01/2020     Lab Results   Component Value Date    ALKPHOS 64 03/01/2020    BILITOT 0.3 03/01/2020    BILIDIR 0.20 01/27/2020    PROT 6.7 03/01/2020    ALBUMIN 3.8 03/01/2020    ALT 9 (L) 03/01/2020    AST 18 03/01/2020            Time spent with patient was 25 minutes.  Additional 25 minutes were spent on preparation, documentation and coordinating care.    Hector Shade, PharmD, BCPS, CPP  [facility] Outpatient Oncology Palliative Care

## 2020-05-11 DIAGNOSIS — E890 Postprocedural hypothyroidism: Principal | ICD-10-CM

## 2020-05-11 MED ORDER — MIRTAZAPINE 7.5 MG TABLET
ORAL_TABLET | Freq: Every evening | ORAL | 1 refills | 30 days | Status: CP
Start: 2020-05-11 — End: 2020-07-10

## 2020-05-11 MED ORDER — LEVOTHYROXINE 125 MCG TABLET
ORAL_TABLET | 3 refills | 0 days | Status: CP
Start: 2020-05-11 — End: ?

## 2020-05-12 NOTE — Unmapped (Signed)
Called pt to notify him of CT and MRI scheduling prior to his appointments on 6/8. Patient confirmed all appointments.

## 2020-05-17 NOTE — Unmapped (Signed)
[facility] Refill Coordination Note    Specialty Medication(s) to be Shipped:   Hematology/Oncology: [name] and [name]    Other medication(s) to be shipped: sulfasalazine     Kenneth Fitzgerald, DOB: 11/26/1975  Phone: There are no phone numbers on file.      All above HIPAA information was verified with patient.     Was a Nurse, learning disability used for this call? No    Completed refill call assessment today to schedule patient's medication shipment from the [facility] [phone]).  All relevant notes have been reviewed.     Specialty medication(s) and dose(s) confirmed: Regimen is correct and unchanged.   Changes to medications: [name] reports no changes at this time.  Changes to insurance: No  New side effects reported not previously addressed with a pharmacist or physician: None reported  Questions for the pharmacist: No    Confirmed patient received a Conservation officer, historic buildings and a Surveyor, mining with first shipment. The patient will receive a drug information handout for each medication shipped and additional FDA Medication Guides as required.       DISEASE/MEDICATION-SPECIFIC INFORMATION        N/A    SPECIALTY MEDICATION ADHERENCE     Medication Adherence    Patient reported X missed doses in the last month: 1  Specialty Medication: [name] 15 mg  Patient is on additional specialty medications: Yes  Additional Specialty Medications: Braftovi 75mg   Patient Reported Additional Medication X Missed Doses in the Last Month: 1  Patient is on more than two specialty medications: No       Were doses missed due to medication being on hold? No    [name] 15 mg: 9 days of medicine on hand   Braftovi 75 mg: 9 days of medicine on hand     REFERRAL TO PHARMACIST     Referral to the pharmacist: Not needed      [facility]     Shipping address confirmed in Epic.     Delivery Scheduled: Yes, Expected medication delivery date: 05/24/2020.     Medication will be delivered via UPS to the prescription address in Epic WAM.    Lorelei Pont [facility] Specialty Technician

## 2020-05-23 MED FILL — SULFASALAZINE 500 MG TABLET: 30 days supply | Qty: 240 | Fill #4

## 2020-05-23 NOTE — Unmapped (Signed)
Kenneth Fitzgerald [facility] 's Braftovi and [facility] shipment will be delivered on 05/26/20 via UPS.

## 2020-05-23 NOTE — Unmapped (Signed)
Kenneth Fitzgerald [facility] 's Braftovi and [facility] shipment will be delayed as a result of the medication is too soon to refill until 05/25/20.     I have reached out to the patient  at [phone] and left a voicemail message.  We will wait for a call back from the patient to reschedule the delivery.  We have not confirmed the new delivery date.

## 2020-05-24 NOTE — Unmapped (Unsigned)
REASON FOR VISIT:  Colitis     HISTORY OF PRESENT ILLNESS:  Since last visit,  01/18/2020 - 02/02/2020, admitted for SBO presumably due to radiation therapy and ventral hernia.  Treated with NG decompression. 01/24/2020, ventral and incisional hernia repair due to recurrent incarcerated hernia.  Melanophages, but no melanoma found in peritoneal implants.  03/22/2020, MRI AP.  1.1 cm T1 liver lesions.  Lobulated appearing gallbladder.  Pancreatic divisum.  14 cm spleen.  Under distended thick-walled bladder.  Tiny hiatal hernia.  No bowel inflammation.  Similar appearance of metastatic implants.  1.1 cm left inguinal lymph nodes that are stable.  7.5 cm rim-enhancing fluid collection in midline anterior abdominal wall.  1.1 cm enhancing lesion in left psoas.  05/10/2020, CBC, CMP normal except hemoglobin 11.7, MCV 75    Patient doing well since surgery for hernia. Wearing abdominal binder. Lost 40 lbs since December unintentionally. 12 wks ago, experienced some dry heaving after brain radiation thought to be due to radiation effects on brain that resolved on zofran and steroids.  Joint pains have improved spontaneously since last visit.  1 formed nonbloody bm per day.  Rare blood on TP. Taking sulfasalazine 1g po bid.    MEDICATIONS:  has a current medication list which includes the following prescription(s): acetaminophen, aluminum-magnesium hydroxide-simethicone, binimetinib, blood sugar diagnostic, blood-glucose meter, calcitriol, calcium carbonate, diphenhydramine, duloxetine, encorafenib, allegra allergy, fluticasone propionate, folic acid, ibuprofen, lancets, levothyroxine, lidocaine 2% viscous, melatonin, memantine, memantine, mirtazapine, and sulfasalazine.    ALLERGIES:  Allergies as of 05/25/2020 - Reviewed 05/10/2020   Allergen Reaction Noted   ??? Compazine [prochlorperazine] Itching 09/21/2011   ??? Coconut Nausea And Vomiting 05/14/2012   ??? Multihance [gadobenate dimeglumine] Other (See Comments) 01/28/2019 PAST MEDICAL HISTORY:  1.  Crohn's colitis.  01/09/2018, colonoscopy done for melanoma staging incidentally showed chronic active colitis in transverse colon.  No diagnosis or treatment was made.  01/2018, immunotherapy for melanoma.  03/2018, bloody diarrhea thought to be due to immunotherapy.  Stopped immunotherapy starting prednisone with improvement in symptoms.  07/2019, slight worsening of bowel frequency.  PET/CT showed increased uptake in transverse colon.  08/24/2019, colonoscopy showed signs of Crohn's colitis.  09/30/2019, start sulfasalazine. 01/24/2020, ventral and incisional hernia repair due to recurrent incarcerated hernia.  2.  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  Treated with immunotherapy in 01/2018 which caused diarrhea prompting cessation.  Currently on targeted oral therapy.  3.  Thyroid cancer diagnosed incidentally on PET/CT done for melanoma in 2013.  Status post thyroidectomy with 2 recurrences.  4.  Recurrent diverticulitis in 2012.  Sigmoid colectomy and June 2012 with pathology showing acute and chronic inflammatory changes as well as perforated diverticulitis.  5.  Ankylosing spondylitis diagnosed 2010.  Treated with prednisone and Humira for 1 year.  Also took sulfasalazine.  Stopped Humira due to melanoma diagnosis.  6.  Ventral hernias.  7.  Depression  8.  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    SOCIAL HISTORY:  Lives alone with his dog. Dispatcher for towing and trucking.  Has brothers and sisters who live in [place].    FAMILY HISTORY:  No IBD or colon cancer    PHYSICAL EXAM:  There were no vitals taken for this visit.  Wt Readings from Last 4 Encounters:   05/10/20 (!) 131.5 kg (290 lb)   04/19/20 (!) 132.3 kg (291 lb 9.6 oz)   04/19/20 (!) 132.5 kg (292 lb)   03/22/20 (!) 133.6 kg (294 lb 9.6  oz)     CONSTITUTIONAL: awake, alert, NAD    TEST DATA:  1.  01/09/2018, colonoscopy to IC valve.  Colocolonic anastomosis in the rectum.  Transverse colon diverticulosis, granularity.  Biopsies showed chronic active colitis with no CMV  2.  05/12/2019, PET/CT.  Increased uptake in soft tissue density in transverse colon.  Multiple tiny peritoneal implants.  Status post sigmoid colectomy.  Multiple ventral hernias.  Stable enlarged hypermetabolic abdominal and pelvic lymph nodes and peritoneal implants.  3.  08/05/2019, CBC, CMP normal  4.  08/24/2019, colonoscopy to terminal ileum.  Internal hemorrhoids.  Colocolonic anastomosis in the rectum.  Pancolonic diverticulosis left> right.  Patchy erythema, granularity, edema, ulcers, pseudopolyps from 0-58 cm.  Tattoo placed 5 cm distal to area of pseudopolyps.  Dark mucosa in mid transverse colon.  Normal ileum.  Transverse colon biopsy normal.  Transverse colon mass biopsy showed moderate chronic active colitis, CMV negative.  Transverse colon biopsy showed ulceration.  Transverse biopsies all showed increased lymphoid infiltration.  Cecum biopsy showed ulcers with large atypical cells possibly due to reactive atypia less likely lymphoma.  No signs of melanoma.  5. 11/25/2019, MRI abdomen.  Extensive motion artifact.  Stable metastatic peritoneal implants, indeterminate L2 lesion, right portal vein thrombosis.  No signs of bowel inflammation.  6. 03/22/2020, MRI AP.  1.1 cm T1 liver lesions.  Lobulated appearing gallbladder.  Pancreatic divisum.  14 cm spleen.  Under distended thick-walled bladder.  Tiny hiatal hernia.  No bowel inflammation.  Similar appearance of metastatic implants.  1.1 cm left inguinal lymph nodes that are stable.  7.5 cm rim-enhancing fluid collection in midline anterior abdominal wall.  1.1 cm enhancing lesion in left psoas.    ASSESSMENT:  1.  Crohn's colitis.  Currently, her GI symptoms seem to be in remission on low-dose sulfasalazine.  Has noticed some blood on the toilet paper.  Possibly hemorrhoids.  Less likely Crohn's disease.  Plan for sigmoidoscopy as previously recommended to restage Crohn's colitis.  2.  Remote episode of hematemesis.  Resolved.  Probably benign etiology such as NSAID gastritis, Mallory-Weiss, Dieulafoy's.  However, could also be melanoma of the upper GI tract.  Plan for EGD as previously recommended.    RECOMMENDATIONS AND PLAN:  Patient Instructions   -Continue sulfasalazine 1g by mouth twice daily  -Continue folic acid once daily.  -Upper endoscopy and flexible sigmoidoscopy to look for causes of bleeding. Only need to do enema prep. No oral prep.  -Clinic with me in 1 year or sooner if needed.               The patient reports they are currently: at home. I spent 15 minutes on the real-time audio and video visit with the patient on the date of service. I spent an additional 5 minutes on pre- and post-visit activities on the date of service.     The patient was not located and I was located within 250 yards of a hospital based location during the real-time audio and video visit. The patient was physically located in [place] or a state in which I am permitted to provide care. The patient and/or parent/guardian understood that s/he may incur co-pays and cost sharing, and agreed to the telemedicine visit. The visit was reasonable and appropriate under the circumstances given the patient's presentation at the time.    The patient and/or parent/guardian has been advised of the potential risks and limitations of this mode of treatment (including, but not limited to,  the absence of in-person examination) and has agreed to be treated using telemedicine. The patient's/patient's family's questions regarding telemedicine have been answered.    If the visit was completed in an ambulatory setting, the patient and/or parent/guardian has also been advised to contact their provider???s office for worsening conditions, and seek emergency medical treatment and/or call 911 if the patient deems either necessary. parent/guardian has been advised of the potential risks and limitations of this mode of treatment (including, but not limited to, the absence of in-person examination) and has agreed to be treated using telemedicine. The patient's/patient's family's questions regarding telemedicine have been answered.    If the visit was completed in an ambulatory setting, the patient and/or parent/guardian has also been advised to contact their provider???s office for worsening conditions, and seek emergency medical treatment and/or call 911 if the patient deems either necessary.

## 2020-05-25 ENCOUNTER — Encounter
Admit: 2020-05-25 | Discharge: 2020-05-26 | Payer: PRIVATE HEALTH INSURANCE | Attending: Gastroenterology | Primary: Gastroenterology

## 2020-05-25 MED FILL — BRAFTOVI 75 MG CAPSULE: ORAL | 30 days supply | Qty: 180 | Fill #7

## 2020-05-25 MED FILL — MEKTOVI 15 MG TABLET: ORAL | 30 days supply | Qty: 180 | Fill #7

## 2020-05-25 NOTE — Unmapped (Signed)
-  Continue sulfasalazine 1g by mouth twice daily  -Continue folic acid once daily.  -Upper endoscopy and flexible sigmoidoscopy to look for causes of bleeding. Only need to do enema prep. No oral prep.  -Clinic with me in 1 year or sooner if needed.

## 2020-05-26 ENCOUNTER — Encounter: Admit: 2020-05-26 | Discharge: 2020-05-27 | Payer: PRIVATE HEALTH INSURANCE | Attending: Clinical | Primary: Clinical

## 2020-05-26 DIAGNOSIS — F330 Major depressive disorder, recurrent, mild: Principal | ICD-10-CM

## 2020-05-26 DIAGNOSIS — C801 Malignant (primary) neoplasm, unspecified: Principal | ICD-10-CM

## 2020-05-26 DIAGNOSIS — F411 Generalized anxiety disorder: Principal | ICD-10-CM

## 2020-05-26 NOTE — Unmapped (Signed)
[facility]  Psychiatry  Psychotherapy Note - Telehealth via??Video  ??  Service Date: May 13,??2022  Service:??30??minutes of??psychotherapy via??video??session  Time with Patient:??30??minutes  ??  Encounter Description:??This encounter was conducted from provider's office via EPIC video session due to COVID-19 pandemic. Kenneth Fitzgerald was located in his home. Rationale for??video session??is need to social distance. See Plan for telemedicine consent/disclaimer.   ??  Encounter Description/Consent:??  Kenneth Fitzgerald visit was completed through telehealth encounter (video).   ????  This patient encounter is appropriate and reasonable under the circumstances. The patient has been advised of the potential risks and limitations of this mode of treatment (including, but not limited to, the absence of in-person examination) and has agreed to be treated in a remote fashion in spite of them. Any and all of the patient's/patient's family's questions on this issue have been answered.     The patient was physically located in [place] in which I am permitted to provide care. The patient??understood that??he may incur co-pays and cost sharing, and agreed to the telemedicine visit. The visit was??reasonable and appropriate under the circumstances given the patient's presentation at the time.   ????  Time Spent: 30??minutes  ??  Assessment:  Kenneth Fitzgerald a 45yo??male??with metastatic melanoma, h/o thyroid cancer, recent portal vein thrombosis, who I see in therapy to address depression and enhance coping with??multiple??stressors.?? We met today via telehealth (video). Mr.??Fitzgerald??reported largely stable psychosocial functioning and no pressing psych concerns. He described mostly encouraging recent visits with Dr. Nedra Hai and Palliative Care; he still appreciates his challenging prognosis.  Kenneth Fitzgerald reported continuing to make progress on stated goals to spend his time meaningfully.????I offered psychotherapy, support,??and recommendations. ??Will see next in ~ one month.  ??  Risk Assessment:  A suicide and violence risk assessment was performed as part of this evaluation. There patient is deemed to be at chronic elevated risk??for self-harm/suicide given the following factors: current diagnosis of depression, suicidal ideation or threats without a plan and past diagnosis of depression. There patient is deemed to be at chronic elevated risk??for violence given the following factors: male gender. These risk factors are mitigated by the following factors: lack of active SI/HI. There is no acute risk for suicide or violence at this time. The patient was educated about relevant modifiable risk factors including following recommendations for treatment of psychiatric illness and abstaining from substance abuse.??While future psychiatric events cannot be accurately predicted, the patient does not currently require acute inpatient psychiatric care and does not currently meet [facility] involuntary commitment criteria.??????????????????  ??  Plan:  1.??I will continue to see pt for psychotherapy. ??Next session??is??scheduled??to take place in ~ one month.  2. Pt??has??information on available [facility] resources.??Pt is working with [name], [name]. ??I have reached out to [name] to explore financial resources.  3.??Pt??has been followed by Dr. Lawernce Keas of Psychiatry.  4. Pt has??my contact information and knows??to reach me as needed.  ??  Subjective:??  Kenneth Fitzgerald??presented as alert and oriented.????Affect was relatively bright and??he was engaged throughout the session as usual.  ??  Kenneth Fitzgerald??reported largely stable overall psychosocial functioning in the weeks since our last session. ??He described mostly encouraging recent visits with Dr. Nedra Hai and Palliative Care, although he still appreciates his prognosis.  He has been staying relatively active through his work and with church.  He has also been visiting with friends and family members, and will begin a weeklong trip at the beach this weekend. 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  He acknowledged that he has not improve the state of his home, citing low energy at the end of the day and that is not a high priority for him at this time. He stated that he plans to work on it when he has adequate time and energy.  ??  Pt??continues to take Cymbalta as prescribed.  ??  I provided??Kenneth Fitzgerald with??support and understanding, and we??discussed and processed??his??thoughts and feelings??again??in??some??depth.????We problem-solved??and reviewed approaches to??address??and??tolerate??distress and depressive symptoms, to enhance his??adaptive coping, and to achieve his stated goals. ??Kenneth Fitzgerald??was engaged and receptive. We will meet next in ~ a month.  ??  Mental Status Exam:  Appearance:  ?? ??Appears stated age   Motor: ?? ??No abnormal movements   Speech/Language:  ?? ??Normal rate, volume, tone, fluency   Mood: ?? ??alright   Affect: ?? ??engaged; pleasant   Thought process: ?? ??Logical, linear, clear, coherent, goal directed   Thought content: ?? ?? ??Denies SI/HI??or thoughts of??self-harm   Perceptual disturbances: ?? ?? ??Behavior not concerning for response to internal stimuli  ??   Orientation: ?? ??Oriented to person, place, time, and general circumstances   Attention: ?? ??Able to fully attend without fluctuations in consciousness   Concentration: ?? ??Able to fully concentrate and attend   Memory: ?? ??Immediate, short-term, long-term, and recall grossly intact   ??Fund of knowledge:  ?? ??Consistent with level of education and development   Insight: ?? ?? ??Intact   Judgment:  ?? ??Intact   Impulse Control: ?? ??Intact   ??  Diagnosis: Recurrent major depressive disorder; ??Anxiety associated with cancer diagnosis  ??  [name]. Dakisha Schoof PhD  May 13,??2022

## 2020-06-02 NOTE — Unmapped (Signed)
Otolaryngology Clinic Note    Kenneth Fitzgerald is a 45 y.o. male is seen in consultation at the request of Referred Selffor evaluation of mucus retention cysts.       History of Present Illness:     The patient is a 45 y.o. male who  has a past medical history of Cancer (CMS-HCC), Disease of thyroid gland, Hypothyroidism, and Skin cancer. who presents for the evaluation of chronic sinonasal symptoms noted on brain MRI ordered by his Oncologists. 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 He endorses intermittent anterior and posterior clear drainage, as well as infrequent facial pressure. He notes changes in sense of smell and taste secondary to brain radiation, but notes good smell/taste prior to XRT. He denies past sinus surgeries. He denies allergy testing or immunotherapy. He currently uses Afrin PRN. He takes Allegra PRN. He does not use topical steroid sprays or saline sprays/rinses. He is not frequently taking antibiotics for sinus infections. His most recent surgery for his cancer was 3-4 years ago.     Update 06/08/2020:   The patient returns today for follow-up. He reports left ear fullness and diminished hearing. He denies any change when trying to pop his ear. He has not been on antibiotics or steroids.  This happened 3 weeks ago and has been very aggravating.  He also feels slightly off balance.      Patients medical records were personally reviewed.      A 12 point review of systems was negative except as indicated.  The patient denies fevers, chills, shortness of breath, chest pain, nausea, vomiting, diarrhea, inability to lie flat, dysphagia, odynophagia, hemoptysis, hematemesis, changes in vision, changes in voice quality, otalgia, otorrhea, vertiginous symptoms, focal deficits, or other concerning symptoms.    Past Medical History     has a past medical history of Cancer (CMS-HCC), Disease of thyroid gland, Hypothyroidism, and Skin cancer.    Past Surgical History     has a past surgical history that includes pr removal nodes, neck,cerv cmplt (Left, 12/07/2012); pr removal nodes, neck,cerv mod rad (Right, 03/29/2013); pr lap, ventral hernia repair,reducible (N/A, 04/07/2014); pr repair recurr incis hernia,reduc (N/A, 01/03/2015); pr implant mesh hernia repair/debridement closure (N/A, 01/03/2015); pr repair recurr incis hernia,strang (N/A, 06/21/2016); pr colonoscopy w/biopsy single/multiple (N/A, 01/09/2018); pr sigmoidoscopy,fine needl bx,us guided (N/A, 01/09/2018); pr colonoscopy w/biopsy single/multiple (N/A, 08/24/2019); pr colsc flx with directed submucosal njx any sbst (N/A, 08/24/2019); pr exploratory of abdomen (N/A, 01/24/2020); pr freeing bowel adhesion,enterolysis (N/A, 01/24/2020); pr repair recurr incis hernia,strang (N/A, 01/24/2020); and pr negative pressure wound therapy dme >50 sq cm (N/A, 01/24/2020).    Current Medications    Current Outpatient Medications   Medication Sig Dispense Refill   ??? binimetinib ([name]) 15 mg tablet Take 3 tablets (45 mg total) by mouth Two (2) times a day. 180 tablet 11   ??? blood sugar diagnostic Strp Dispense 100 blood glucose test strips, ok to sub any brand preferred by insurance/patient, use 3x/day; dispense whatever brand matches with meter. 100 strip 12   ??? blood-glucose meter kit Use as instructed; dispense 1 meter, whatever is preferred by insurance 1 each 1   ??? calcitrioL (ROCALTROL) 0.25 MCG capsule Take 1 capsule (0.25 mcg total) by mouth daily. 90 capsule 3   ??? calcium carbonate 650 mg calcium (1,625 mg) tablet Take 1 tablet by mouth Three (3) times a day with a meal.     ???  DULoxetine (CYMBALTA) 30 MG capsule TAKE 1 CAPSULE (30 MG TOTAL) BY MOUTH TWO (2) TIMES A DAY. 180 capsule 2   ??? encorafenib (BRAFTOVI) 75 mg capsule Take 6 capsules (450 mg total) by mouth daily. 180 capsule 11   ??? fexofenadine (ALLEGRA ALLERGY) 180 MG tablet Take 180 mg by mouth daily as needed (allergies).      ??? fluticasone propionate (FLONASE) 50 mcg/actuation nasal spray 2 sprays into each nostril two (2) times a day. 16 g 6   ??? folic acid (FOLVITE) 1 MG tablet Take 1 tablet (1 mg total) by mouth daily. 30 tablet 11   ??? lancets Misc Dispense 100 lancets, ok to sub any brand preferred by insurance/patient, use 3x/day 100 each 12   ??? levothyroxine (SYNTHROID) 125 MCG tablet Take 2 tablets (250 mcg total) every Sunday (ie 1 day per week). Take 3 tablets (375 mcg total) on Monday-Saturday (ie 6 days per week). 270 tablet 3   ??? memantine (NAMENDA) 10 MG tablet Take 10 mg by mouth daily.     ??? memantine (NAMENDA) 10 MG tablet TAKE 1 TABLET BY MOUTH TWO TIMES A DAY. 180 tablet 4   ??? sulfaSALAzine (AZULFIDINE) 500 mg tablet Take 2 tablets (1000mg ) by mouth twice daily for 7 days, then take 4 tablets (2000mg ) twice daily thereafter. 240 tablet 11   ??? acetaminophen (TYLENOL) 500 MG tablet Take 2 tablets (1,000 mg total) by mouth every eight (8) hours as needed for pain. (Patient not taking: Reported on 06/08/2020) 30 tablet 0   ??? ibuprofen (ADVIL,MOTRIN) 200 MG tablet Take 600 mg by mouth daily as needed for pain. (Patient not taking: Reported on 06/08/2020)     ??? ondansetron (ZOFRAN) 8 MG tablet Take 1 tablet by mouth Take as directed. (Patient not taking: Reported on 06/08/2020)       No current facility-administered medications for this visit.       Allergies    Allergies   Allergen Reactions   ??? Compazine [Prochlorperazine] Itching   ??? Coconut Nausea And Vomiting   ??? Multihance [Gadobenate Dimeglumine] Other (See Comments)     Patient sneezed immediately after administration of contrast.       Family History    Negative for bleeding disorders or free bleeding.     family history includes Arrhythmia in his mother; Colon cancer in his paternal grandmother; Coronary artery disease in his father; Diabetes in his father; Hypertension in his father; Hyperthyroidism in his mother; Osteoporosis in his mother; Prostate cancer in his father; Squamous cell carcinoma in his mother.    Social History:     reports that he has never smoked. He has never used smokeless tobacco.   reports current alcohol use.   reports no history of drug use.    Review of Systems    A 12 system review of systems was performed and is negative other than that noted in the history of present illness.    Vital Signs  Temperature 37 ??C (98.6 ??F), resp. rate 18, height 188 cm (6' 2), weight (!) 133.8 kg (295 lb).    RSDI is 9.     Physical Exam    General: Well-developed, well-nourished. Appropriate, comfortable, and in no apparent distress.  Head/Face: On external examination there is no obvious asymmetry or scars. On palpation there is no tenderness over maxillary sinuses or masses within the salivary glands. Cranial nerves V and VII are intact through all distributions.  Eyes: PERRL, EOMI, the conjunctiva are not  injected and sclera is non-icteric.  Ears: On external exam, there is no obvious lesions or asymmetry. The EACs are bilaterally with mild cerumen. Amber effusion on the left side. TM is hypomobile.  There are no middle ear masses or fluid noted. Hearing is grossly intact bilaterally.  Nose: On external exam there are neither lesions nor asymmetry of the nasal tip/ dorsum. On anterior rhinoscopy, visualization posteriorly is limited on anterior examination. For this reason, to adequately evaluate posteriorly for masses, polypoid disease and/or signs of infections, nasal endoscopy is indicated (see procedure below).  Oral cavity/oropharynx: The mucosa of the lips, gums, hard and soft palate, posterior pharyngeal wall, tongue, floor of mouth, and buccal region are without masses or lesions and are normally hydrated. Good dentition. Tongue protrudes midline. Tonsils are normal appearing. Supraglottis not visualized due to gag reflex.  Neck: There is no asymmetry or masses. Trachea is midline. There is no enlargement of the thyroid or palpable thyroid nodules. Lymphatics: There is no palpable lymphadenopathy along the jugulodiagastric, submental, or posterior cervical chains.  Chest: No audible wheeze, unlabored respirations.  Cardiovascular: Regular rate.  GI: Nondistended.  Neurologic: Cranial nerve???s II-XII are grossly intact. Exam is non-focal.  Extremities: No cyanosis, clubbing or edema.    Procedures:  Diagnostic Bilateral Nasal Endoscopy (CPT [id number])    NOTE: Nasal endoscopy is performed for the sinuses only, and not for examination of the skull base, septum or inferior turbinates, nor is it related to any previously performed septoplasty or inferior turbinate surgery or skull base surgery.     Surgeon: Egbert Garibaldi, MD  Anesthesia: none  Procedure Detail:  As a result of inability to visualize the intranasal anatomy, and after discussion of the potential risks related to the procedure (primarily bleeding), a endoscope is used to examine the left and right sinonasal cavities, including the interior of the nasal cavity and the middle and superior meatus, the turbinates, and the spheno-ethmoid recess. All these areas were inspected.    Findings:    Examination on the left reveals an intact nasal septum with no associated masses, lesions, or friable mucosa. The left middle meatus, sphenoethmoidal recess, and skull base are clear with no evidence of purulence, polyposis, or polypoid edema. Low left septal deviation. No ET masses.     Examination on the right reveals an intact nasal septum with no associated masses, lesions, or friable mucosa. The right middle meatus, sphenoethmoidal recess, and skull base are clear with no evidence of purulence, polyposis, or polypoid edema. Mild septal deviation.       Oretha Ellis Nasal Endoscopy Score: [employer] is used to assess the degree of inflammation of the sinonasal structures, including the middle and superior turbinates, the ethmoid sinuses, maxillary sinuses, frontal sinuses, and sphenoid sinuses. In the presence of previous surgery, some or all of these structures may be absent.    Left        ?? Polyps:  Absent (0)   ?? Edema:   None (0)  ?? Discharge:  Clear, Thin (1)    ?? Scarring:  Absent (0)   ?? Crusting:  None (0)      Total Left:  1     Right         ?? Polyps:  Absent (0)   ?? Edema:  Absent (0)   ?? Discharge: Clear, Thin (1)    ?? Scarring:  Absent (0)   ?? Crusting:  None (0)  Total Right:   1    Procedure 2: Myringotomy  Preop Dx: otitis media with effusion  Anesthesia: none  Description: After obtaining consent, phenol was applied to the anterior inferior aspect of the eardrum. A myringotomy was made in that position.  The serous fluid was suctioned free with immediate improvement in hearing.  The patient tolerated the procedure well.  There were no complications.     Labs and Diagnostic Tests  Left type B. Mild/moderate CHL on the left normal on the right.     MRI Brain 03/13/2020:       FINDINGS:    Innumerable punctate T1 bright foci scattered throughout the supra/infratentorial parenchyma predominantly centered at the gray-white matter junction and with sparing the brainstem. The largest focus measures 0.3 cm and is centered along the periphery of right head of the caudate. A number of these foci exhibit enhancement. No definitive new or enlarging lesions (given difference in magnet strength). However, some of the lesions demonstrate interval internal microhemorrhage (marked on series 10). Ventricles are normal in size. There is no midline shift. No extra-axial fluid collection. No evidence of acute intracranial hemorrhage. No diffusion weighted signal abnormality to suggest acute infarct.   ??  Linear segment of enhancement within the anterior apex of the right parietal bone, similar in size and appearance from the prior (103:121)  ??  Persistent complete opacification of the left maxillary sinus and a large mucosal retention cyst in the right. Trace left greater than right mastoid effusions.  ??  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 No definitive new or enlarging lesions; however, several lesions demonstrate interval microhemorrhage, as compared to 11/25/2019.  ??  Similar size and appearance of an enhancing osseous lesion within the right parietal bone, indeterminate, but may represent an osseous metastasis. Recommend continued attention on follow-up.    Audiogram 06/08/2020:       Assessment:  The patient is a 44 y.o. male who  has a past medical history of Cancer (CMS-HCC), Disease of thyroid gland, Hypothyroidism, and Skin cancer. who presents for the evaluation of:  Allergic Rhinitis  Posterior Nasal Drainage  Headache/Facial Pain  Diminished Sense of Smell   Mucus Retention Cysts  SNHL (L)    Recommendations:  1. Based on the results of the patient's Audiogram, he has evidence of diminished hearing on the left. We extensively discussed treatment options. The patient voiced interest in a left-sided Myringotomy.  This was performed with subjective improvement.  Will plan on seeing him back.  If this reaccumulated after the myringotomy healed, would likely have him see Dorris lin    2. The patient has evidence of mucus retention cysts (L>R) noted on radiographic imaging. However, he is asymptomatic from that standpoint, and I do not recommend surgical intervention at this time. Continue to monitor with radiographic imaging scheduled by Oncology. He will stay on a nasal hygiene regimen which includes nasal saline irrigations and nasal steroid sprays daily. We have discussed the proper positioning for optimal use of the nasal steroid. I will also prescribe him the nasal steroid spray. Allegra PRN.     He knows to reach out to my clinic if he starts to experience severe facial pain.     The patient's physical examination findings including flexible fiberoptic nasopharyngolaryngosocpy/sinonasal endoscopy were thoroughly discussed.    The patient voiced complete understanding of plan  as detailed above and is in full agreement.    Scribe's Attestation: Junius Finner, MD obtained and performed the history, physical exam and medical decision making elements that were entered into the chart. Signed by Frazier Butt, Scribe, on Jun 08, 2020 2:42 PM    ----------------------------------------------------------------------------------------------------------------------  Jun 08, 2020 9:32 PM. Documentation assistance provided by the Scribe. I was present during the time the encounter was recorded. The information recorded by the Scribe was done at my direction and has been reviewed and validated by me.  ----------------------------------------------------------------------------------------------------------------------

## 2020-06-03 MED ORDER — MEMANTINE 10 MG TABLET
ORAL_TABLET | 4 refills | 0 days
Start: 2020-06-03 — End: ?

## 2020-06-06 NOTE — Unmapped (Signed)
Please refill if appropriate

## 2020-06-08 ENCOUNTER — Encounter
Admit: 2020-06-08 | Discharge: 2020-06-08 | Payer: PRIVATE HEALTH INSURANCE | Attending: Audiologist | Primary: Audiologist

## 2020-06-08 ENCOUNTER — Ambulatory Visit
Admit: 2020-06-08 | Discharge: 2020-06-08 | Payer: PRIVATE HEALTH INSURANCE | Attending: Student in an Organized Health Care Education/Training Program | Primary: Student in an Organized Health Care Education/Training Program

## 2020-06-08 NOTE — Unmapped (Incomplete)
[facility]  Audiology at [facility]    AUDIOLOGIC EVALUATION REPORT     Patient: Kenneth Fitzgerald, Kenneth Fitzgerald  MRN: [id number]  DOB: 01/29/1975  DATE OF EVALUATION: 06/08/2020    PLEASE SEE AUDIOGRAM INCLUDING FULL REPORT IN MEDIA MANAGER TAB.      HISTORY     Jean Skow is a 45 y.o. male seen by Audiology for a hearing evaluation as part of Dr. Madelaine Bhat Kimple's add-on clinic. His medical history is significant for current cx treatment. Today, patient reports about a 3 week hx of fullness underwater in his left ear. He denies ear pain and tinnitus.    RESULTS     Otoscopy revealed:  Right Ear: clear external auditory canal  Left Ear: clear external auditory canal    Tympanometry using a 226 Hz probe tone was consistent with:  Right Ear: Type A tympanogram, consistent with normal middle ear pressure, compliance, and volume  Left Ear: Type B tympanogram, consistent with abnormal middle ear function    Today's behavioral evaluation was completed using conventional audiometry via insert earphones with good reliability.     Right Ear: Hearing WNL with a mild loss at 4k Hz  ??? Speech Recognition Threshold (SRT): 10 dB HL  ??? Word Recognition Testing: 100% at 50 dB HL using recorded NU-6 word list.    Left Ear: Mild to moderate CHL  ??? Speech Recognition Threshold (SRT): 35 dB HL  ??? Word Recognition Testing: 96% at 70 dB HL using recorded NU-6 word list.    Patient was counseled on today's results and expressed understanding.     RECOMMENDATIONS     ??? ENT - Seeing Dr. Ralene Ok today  ??? Re-evaluate hearing per ENT request, sooner should concerns arise    Babs Bertin, B.S.  Sharol Given, B.S.    Provider and student wore appropriate PPE (face mask and eye protection) for the entirety of today's appointment. Patient wore face mask(s) during the appointment.    Charges associated with this visit:  HC TYMPANOMETRY  HC COMP AUDIO EVAL & SPEECH RECOG

## 2020-06-13 NOTE — Unmapped (Signed)
[facility]  Audiology at [facility]    AUDIOLOGIC EVALUATION REPORT     Patient: Kenneth Fitzgerald, Kenneth Fitzgerald  MRN: [id number]  DOB: 01/29/1975  DATE OF EVALUATION: 06/08/2020    PLEASE SEE AUDIOGRAM INCLUDING FULL REPORT IN MEDIA MANAGER TAB.      HISTORY     Kenneth Fitzgerald is a 45 y.o. male seen by Audiology for a hearing evaluation as part of Dr. Madelaine Bhat Kimple's add-on clinic. His medical history is significant for current cx treatment. Today, patient reports about a 3 week hx of fullness underwater in his left ear. He denies ear pain and tinnitus.    RESULTS     Otoscopy revealed:  Right Ear: clear external auditory canal  Left Ear: clear external auditory canal    Tympanometry using a 226 Hz probe tone was consistent with:  Right Ear: Type A tympanogram, consistent with normal middle ear pressure, compliance, and volume  Left Ear: Type B tympanogram, consistent with abnormal middle ear function    Today's behavioral evaluation was completed using conventional audiometry via insert earphones with good reliability.     Right Ear: Hearing WNL with a mild loss at 4k Hz  ??? Speech Recognition Threshold (SRT): 10 dB HL  ??? Word Recognition Testing: 100% at 50 dB HL using recorded NU-6 word list.    Left Ear: Mild to moderate CHL  ??? Speech Recognition Threshold (SRT): 35 dB HL  ??? Word Recognition Testing: 96% at 70 dB HL using recorded NU-6 word list.    Patient was counseled on today's results and expressed understanding.     RECOMMENDATIONS     ??? ENT - Seeing Dr. Ralene Ok today  ??? Re-evaluate hearing per ENT request, sooner should concerns arise    Babs Bertin, B.S.  Sharol Given, B.S.    Provider and student wore appropriate PPE (face mask and eye protection) for the entirety of today's appointment. Patient wore face mask(s) during the appointment.    Charges associated with this visit:  HC TYMPANOMETRY  HC COMP AUDIO EVAL & SPEECH RECOG

## 2020-06-15 DIAGNOSIS — F32A Depression, unspecified depression type: Principal | ICD-10-CM

## 2020-06-15 MED ORDER — MIRTAZAPINE 7.5 MG TABLET
ORAL_TABLET | Freq: Every evening | ORAL | 3 refills | 30.00000 days | Status: CP
Start: 2020-06-15 — End: ?

## 2020-06-15 NOTE — Unmapped (Signed)
Hi,    Patient Kenneth Fitzgerald [facility] called requesting a medication refill for the following:    ??? Medication: Remeron/ Mirtazapine  ??? Dosage: 7.5 mg  ??? Days left of medication: 0  ??? Pharmacy: [facility] [place], [place]    The expected turnaround time is 3-4 business days     Please contact [name] at [phone]      Thank you,  [name]  [facility]  [phone]

## 2020-06-15 NOTE — Unmapped (Signed)
Called pt to let him know requested script was sent to his pharmacy. He will call back with any further needs.

## 2020-06-20 NOTE — Unmapped (Signed)
[facility] Refill Coordination Note    Specialty Medication(s) to be Shipped:   Hematology/Oncology: [name] and [name]    Other medication(s) to be shipped: sulfasalazine 500mg      Leonel Mccollum Sidney, DOB: 1975/10/09  Phone: There are no phone numbers on file.      All above HIPAA information was verified with patient.     Was a Nurse, learning disability used for this call? No    Completed refill call assessment today to schedule patient's medication shipment from the [facility] [phone]).  All relevant notes have been reviewed.     Specialty medication(s) and dose(s) confirmed: Regimen is correct and unchanged.   Changes to medications: Khary reports no changes at this time.  Changes to insurance: No  New side effects reported not previously addressed with a pharmacist or physician: None reported  Questions for the pharmacist: No    Confirmed patient received a Conservation officer, historic buildings and a Surveyor, mining with first shipment. The patient will receive a drug information handout for each medication shipped and additional FDA Medication Guides as required.       DISEASE/MEDICATION-SPECIFIC INFORMATION        N/A    SPECIALTY MEDICATION ADHERENCE     Medication Adherence    Patient reported X missed doses in the last month: 0  Specialty Medication: Braftovi  Patient is on additional specialty medications: Yes  Additional Specialty Medications: [name]  Patient Reported Additional Medication X Missed Doses in the Last Month: 0  Patient is on more than two specialty medications: No              Were doses missed due to medication being on hold? No    Braftovi 75 mg: 5 days of medicine on hand   [name] 15 mg: 5 days of medicine on hand        REFERRAL TO PHARMACIST     Referral to the pharmacist: Not needed      [facility]     Shipping address confirmed in Epic.     Delivery Scheduled: Yes, Expected medication delivery date: 06/23/20.     Medication will be delivered via UPS to the prescription address in Epic WAM.    Unk Lightning   [facility] Specialty Technician

## 2020-06-21 ENCOUNTER — Ambulatory Visit: Admit: 2020-06-21 | Discharge: 2020-07-13 | Payer: PRIVATE HEALTH INSURANCE

## 2020-06-21 ENCOUNTER — Encounter: Admit: 2020-06-21 | Discharge: 2020-07-13 | Payer: PRIVATE HEALTH INSURANCE

## 2020-06-21 ENCOUNTER — Encounter
Admit: 2020-06-21 | Discharge: 2020-07-13 | Payer: PRIVATE HEALTH INSURANCE | Attending: Radiation Oncology | Primary: Radiation Oncology

## 2020-06-21 ENCOUNTER — Encounter
Admit: 2020-06-21 | Discharge: 2020-07-13 | Payer: PRIVATE HEALTH INSURANCE | Attending: Hematology & Oncology | Primary: Hematology & Oncology

## 2020-06-21 DIAGNOSIS — C439 Malignant melanoma of skin, unspecified: Principal | ICD-10-CM

## 2020-06-21 DIAGNOSIS — R530 Neoplastic (malignant) related fatigue: Principal | ICD-10-CM

## 2020-06-21 DIAGNOSIS — Z515 Encounter for palliative care: Principal | ICD-10-CM

## 2020-06-21 DIAGNOSIS — C799 Secondary malignant neoplasm of unspecified site: Principal | ICD-10-CM

## 2020-06-21 LAB — CBC W/ AUTO DIFF
BASOPHILS ABSOLUTE COUNT: 0.1 10*9/L (ref 0.0–0.1)
BASOPHILS RELATIVE PERCENT: 1 %
EOSINOPHILS ABSOLUTE COUNT: 0.2 10*9/L (ref 0.0–0.5)
EOSINOPHILS RELATIVE PERCENT: 2.8 %
HEMATOCRIT: 37 % — ABNORMAL LOW (ref 39.0–48.0)
HEMOGLOBIN: 11.9 g/dL — ABNORMAL LOW (ref 12.9–16.5)
LYMPHOCYTES ABSOLUTE COUNT: 1.4 10*9/L (ref 1.1–3.6)
LYMPHOCYTES RELATIVE PERCENT: 19.4 %
MEAN CORPUSCULAR HEMOGLOBIN CONC: 32.2 g/dL (ref 32.0–36.0)
MEAN CORPUSCULAR HEMOGLOBIN: 25.3 pg — ABNORMAL LOW (ref 25.9–32.4)
MEAN CORPUSCULAR VOLUME: 78.4 fL (ref 77.6–95.7)
MEAN PLATELET VOLUME: 8.8 fL (ref 6.8–10.7)
MONOCYTES ABSOLUTE COUNT: 0.5 10*9/L (ref 0.3–0.8)
MONOCYTES RELATIVE PERCENT: 7.4 %
NEUTROPHILS ABSOLUTE COUNT: 5 10*9/L (ref 1.8–7.8)
NEUTROPHILS RELATIVE PERCENT: 69.4 %
PLATELET COUNT: 235 10*9/L (ref 150–450)
RED BLOOD CELL COUNT: 4.72 10*12/L (ref 4.26–5.60)
RED CELL DISTRIBUTION WIDTH: 20.4 % — ABNORMAL HIGH (ref 12.2–15.2)
WBC ADJUSTED: 7.3 10*9/L (ref 3.6–11.2)

## 2020-06-21 LAB — COMPREHENSIVE METABOLIC PANEL
ALBUMIN: 3.9 g/dL (ref 3.4–5.0)
ALKALINE PHOSPHATASE: 76 U/L (ref 46–116)
ALT (SGPT): 21 U/L (ref 10–49)
ANION GAP: 7 mmol/L (ref 5–14)
AST (SGOT): 27 U/L (ref <=34)
BILIRUBIN TOTAL: 0.2 mg/dL — ABNORMAL LOW (ref 0.3–1.2)
BLOOD UREA NITROGEN: 16 mg/dL (ref 9–23)
BUN / CREAT RATIO: 16
CALCIUM: 8.2 mg/dL — ABNORMAL LOW (ref 8.7–10.4)
CHLORIDE: 107 mmol/L (ref 98–107)
CO2: 27 mmol/L (ref 20.0–31.0)
CREATININE: 0.97 mg/dL
EGFR CKD-EPI (2021) MALE: >90 mL/min/{1.73_m2} (ref >=60)
GLUCOSE RANDOM: 81 mg/dL (ref 70–179)
POTASSIUM: 3.9 mmol/L (ref 3.4–4.8)
PROTEIN TOTAL: 6.8 g/dL (ref 5.7–8.2)
SODIUM: 141 mmol/L (ref 135–145)

## 2020-06-21 LAB — LACTATE DEHYDROGENASE: LACTATE DEHYDROGENASE: 221 U/L (ref 120–246)

## 2020-06-21 MED ADMIN — gadobenate dimeglumine (MULTIHANCE) 529 mg/mL (0.1mmol/0.2mL) solution 20 mL: 20 mL | INTRAVENOUS | @ 14:00:00 | Stop: 2020-06-21

## 2020-06-21 MED ADMIN — iohexoL (OMNIPAQUE) 350 mg iodine/mL solution 50 mL: 50 mL | INTRAVENOUS | @ 14:00:00 | Stop: 2020-06-21

## 2020-06-21 NOTE — Unmapped (Signed)
06/21/2020      Subjective/Assessment/Recommendations: Pt in radiation oncology clinic today for follow up regarding radiation treatment to the brain. Pt finished treatment on 01/20/2020.    1. Fatigue: Mild and pt gets tired easily-but geting more strength as time goes on  2. Chemo: Not administered  3. Pain: Denies pain  4. Neurologic status: Headache, Ataxia/balance and headaches are only occasional and balance issues only if pt stands up too quickly  5. Psychosocial: Has home support

## 2020-06-21 NOTE — Unmapped (Signed)
See [place] back in 3 mos w/scans and labs.     For appointments & questions Monday through Friday 8 AM--5 PM     Please call [phone] or Toll free [phone]    For urgent clinical needs on Nights, Weekends or Holidays  Call [phone] and ask for the oncologist on call.     For appointment changes please contact during normal business hours.     Please visit [email or web address], a resource created just for family members and caregivers.  This website lists support services, how and where to ask for help. It has tools to assist you as you help [place] care for your loved one.    [facility]  [address]  [place], [place] [address]  [email or web address]

## 2020-06-21 NOTE — Unmapped (Signed)
PRIMARY CARE PHYSICIAN  TIMOTHY R DRAPER, DO  [address]  [place] [address]    CONSULTING PHYSICIANS  [name], [place]  [address]  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   S/p thyroidectomy 06/2011 and radioactive iodine 03/2012 and again 05/2013.    Thyroid CA recurrence 11/2012 s/p L neck dissecion 12/07/2012.  2nd thyroid CA recurrence 03/2013 AND co-existing 1st melanoma recurrence 03/2013 s/p R cervical level 2-4 node dissection and partial parotidectomy  S/p radiation to R neck, completed 06/2013  Nivolumab (02/12/18 x 2 cycles) complicated by colitis that required a 6 week steroid taper.     CURRENT THERAPY:   BRAF/MEK started early May 2020    ASSESSMENT: Kenneth Fitzgerald is a pleasant 45 y.o. male who presents today for further evaluation of recurrent stage IV melanoma.  He completed adjuvant high dose IFN in Jan 2014. Since then, thyroid CA has recurred twice and melanoma recurred locally and then distantly. Now w/metastatic disease in abdominal/pelvic LN, on BRAFtovi/[name].    PLAN  1. Stage IV melanoma: BRAF+ by IHC. Dr Eyvonne Left reviewed path: TILS present, non-brisk; lymphocytes are 20 to 30% the number of melanoma cells.  Known sites of disease include abdominal/pelvic, and brain. Started dabrafenib/trametinib mid-May 2020, has had stable disease.      -Reviewed path from hernia surgery in Jan 2022, noted that the abdominal wall/peritoneal implants contained melanophages, but no melanoma cells.  We discussed that this is interesting, good to know that there wasn't melanoma in the biopsy, is in keeping w/immune response.    - He is back on BRAF/MEK targeted therapy.  Will continue.   - He is s/p WBRT, completed 01/20/20. MRI brain from today personally reviewed and c/w stable disease.  Rad/onc is following.  - I personally reviewed CT chest from today 06/21/20 which is NED, abdominal MRI also w/out progression of disease.   -clinically, he is doing very well, much improved over time.  -RTC in 12 weeks for clinical follow up and repeat imaging.  -Of note, we have attempted to confirm BRAF on NGS twice, on original primary specimen from July 2012 and and pelvic LN sample from 01/09/2018.  Both samples were insufficient tissue for analysis.  At this point, BRAF is known by IHC only.     #.) Brain mets; s/p WBRT as per above.  Brain MRI from today 06/21/20. Dry heaves and nausea much improved w/flonase.  No longer on steroid taper.     #) L ear fullness, hearing loss; improved s/p myringotomy but has recurred; will reach out to ENT for further recs.     #.) Atypical Crohn's; recent colonoscopy w/ulcerations and crypt abscesses.  Sulfasalazine per GI medicine.    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 During hospital stay, it was determined that this was malignant thrombus and no AC recommended.  Will follow.     #.) Hernia repair; all scars healing well and he is eating and drinking normally.  Surgeon aware abdominal wall finding, CTM.     #.) Thyroid CA, recurrent. Well controlled now. Followed by endocrinology for thyroid and Ca+ replacement and ongoing monitoring of glucose.    #.) Mood; excessive stress related to h/o 2 malignancies, job loss and legal  issues surrounding, parents both passed away in recent years.   He follows w/ Drs. Y[facility] and [facility].     #.) HypoCA; he struggles w/compliance during the workday,cont Ca supplementation    #.) Supportive care/GOC;appreciate pall care support and med titration. Discussed w/[name]; OK to pause pall care for now given well controlled disease and well controlled symptoms.      INTERVAL HISTORY: This is a return visit to the [facility] for further evaluation of melanoma.  Had symptomatic improvement of hearing loss after myringotomy 5/26.   Unfortunately, hearing loss recurred about 2 weeks after procedure.  Energy is slowly improving.    Sleeps better at night w/remeron.    Works 45-50hrs/week.  No more dry heaving, no more nausea.  HA/scalp pain much better.   Weight loss has stabilized.  Appetite is improved.    Less twitching in UE/LE when in a relaxed state.   Is in a wedding on 8/7.   Wants to go ziplining.   Bowels are normal for him; recent diagnosis of atypical crohn's disease, started on sulfasalazine 2 tabs BID  Follows w/D[facility]. [facility], finds this helpful.Mood is good.  Usually does 2 glasses/milk and 1500mg  Ca+      PAST MEDICAL HISTORY:   1. Diverticulitis.   2. History of ankylosing spondylitis in 2009. Please see past medical history dictated on 11/15/2010 for details. In brief, he was treated w/Enbrel and prednisone for 6mos or so.  Symptoms improved, immunosuppressants were stopped and symptoms did not recur.   3. S/p incisional hernia repair       SOCIAL HISTORY:    reports that he has never smoked. He has never used smokeless tobacco. He reports current alcohol use. He reports that he does not use drugs.      MEDICATIONS:   Current Outpatient Medications   Medication Sig Dispense Refill   ??? acetaminophen (TYLENOL) 500 MG tablet Take 2 tablets (1,000 mg total) by mouth every eight (8) hours as needed for pain. 30 tablet 0   ??? binimetinib ([name]) 15 mg tablet Take 3 tablets (45 mg total) by mouth Two (2) times a day. 180 tablet 11   ??? blood sugar diagnostic Strp Dispense 100 blood glucose test strips, ok to sub any brand preferred by insurance/patient, use 3x/day; dispense whatever brand matches with meter. 100 strip 12   ??? blood-glucose meter kit Use as instructed; dispense 1 meter, whatever is preferred by insurance 1 each 1   ??? calcitrioL (ROCALTROL) 0.25 MCG capsule Take 1 capsule (0.25 mcg total) by mouth daily. 90 capsule 3   ??? calcium carbonate 650 mg calcium (1,625 mg) tablet Take 1 tablet by mouth Three (3) times a day with a meal.     ??? DULoxetine (CYMBALTA) 30 MG capsule TAKE 1 CAPSULE (30 MG TOTAL) BY MOUTH TWO (2) TIMES A DAY. 180 capsule 2   ??? encorafenib (BRAFTOVI) 75 mg capsule Take 6 capsules (450 mg total) by mouth daily. 180 capsule 11   ??? fexofenadine (ALLEGRA ALLERGY) 180 MG tablet Take 180 mg by mouth daily as needed (allergies).      ??? fluticasone propionate (FLONASE) 50 mcg/actuation nasal spray 2 sprays into each nostril two (2) times a day. 16 g 6   ??? folic acid (FOLVITE) 1 MG tablet Take 1 tablet (1 mg total) by mouth daily. 30 tablet 11   ??? ibuprofen (ADVIL,MOTRIN) 200 MG tablet Take 600 mg by mouth as needed  in the morning for pain.     ??? lancets Misc Dispense 100 lancets, ok to sub any brand preferred by insurance/patient, use 3x/day 100 each 12   ??? levothyroxine (SYNTHROID) 125 MCG tablet Take 2 tablets (250 mcg total) every Sunday (ie 1 day per week). Take 3 tablets (375 mcg total) on Monday-Saturday (ie 6 days per week). 270 tablet 3   ??? memantine (NAMENDA) 10 MG tablet Take 10 mg by mouth daily.     ??? memantine (NAMENDA) 10 MG tablet TAKE 1 TABLET BY MOUTH TWO TIMES A DAY. 180 tablet 4   ??? mirtazapine (REMERON) 7.5 MG tablet Take 1 tablet (7.5 mg total) by mouth nightly. 30 tablet 3   ??? ondansetron (ZOFRAN) 8 MG tablet Take 1 tablet by mouth Take as directed.     ??? sulfaSALAzine (AZULFIDINE) 500 mg tablet Take 2 tablets (1000mg ) by mouth twice daily for 7 days, then take 4 tablets (2000mg ) twice daily thereafter. 240 tablet 11     No current facility-administered medications for this visit.       REVIEW OF SYSTEMS  A complete review of systems was obtained and was negative except for those symptoms listed in the HPI     PHYSICAL EXAM  Vitals:    06/21/20 1404   BP: 132/90   Pulse: 90   Resp: 18   Temp: 37.2 ??C (98.9 ??F)   SpO2: 99%       GEN: Awake and alert, pleasant appearing male in no acute distress  LUNGS: normal WOB, CTAB  CV: NAD, S1S2  ABD; obese, soft NTND  SKIN: No rashes, petechiae or jaundice noted.  All melanoma scarlines examined; no nodularity.    PYSCH: Alert and oriented to person, place and time  EXT: No edema noted of the lower extremity noted.          LABS  Lab Results   Component Value Date    WBC 6.3 05/10/2020    HGB 11.7 (L) 05/10/2020    HCT 35.2 (L) 05/10/2020    PLT 269 05/10/2020       Lab Results   Component Value Date    NA 141 05/10/2020    K 3.5 05/10/2020    CL 107 05/10/2020    CO2 24.0 05/10/2020    BUN 15 05/10/2020    CREATININE 0.93 05/10/2020    GLU 116 05/10/2020    CALCIUM 7.6 (L) 05/10/2020    MG 1.8 02/01/2020    PHOS 5.8 (H) 05/10/2020       Lab Results   Component Value Date    BILITOT 0.2 (L) 05/10/2020    BILIDIR 0.20 01/27/2020    PROT 6.8 05/10/2020    ALBUMIN 3.8 05/10/2020    ALT 29 05/10/2020    AST 24 05/10/2020    ALKPHOS 86 05/10/2020    GGT 42 08/22/2011       Lab Results   Component Value Date    INR 1.12 01/24/2020       RADIOLOGY RESULTS;  CT chest 06/21/20:  IMPRESSION:  ??  No intrathoracic metastasis.    Brain MRI 06/21/20:  IMPRESSION:  No change with numerous micrometastasis noted in the cerebral and cerebellar hemispheres some of which are associated with microhemorrhage.      Abd MRI 06/21/20:    IMPRESSION:  ??  1.Postsurgical changes along the midline anterior wall with a rim-enhancing fluid collection located within the surgical incision, significantly decreased in size compared to prior.  2.Perihepatic/capsular implants appear slightly decreased in size. Otherwise, stable hepatic and peritoneal metastases.  3. Previously described left psoas muscle intramuscular lesion or collection is significantly decreased, and not well delineated on the current exam.  ??  I have reviewed the images and interpretation with the radiology resident/fellow and agree with the resident report as above.        Echo 04/29/19:  Summary    1. The left ventricle is normal in size with normal wall thickness.    2. The left ventricular systolic function is normal, LVEF is visually  estimated at 55%.    3. The right ventricle is upper normal in size, with normal systolic  function.          PATHOLOGY:  Pelvic LN 01/09/18:  Final Diagnosis   A: Colon, transverse, biopsy  - Moderately-severely active chronic colitis  - No CMV viral cytopathic effect, granuloma, or dysplasia identified  - No metastatic melanoma or papillary thyroid cancer identified  - See comment  ??  B: Lymph node, pelvic, core needle biopsy  - Metastatic melanoma  - See comment  ??     FNA R femur 07/19/14:  : Bone, right femur, core biopsy with touch imprints  - No primary or metastatic malignancy identified  - Bone with focal osteocyte dropout, suggestive of necrosis  - Bone marrow with trilineage hematopoiesis and focal effacement by chronic inflammation (see comment)    B: Bone, right femur, fine needle aspiration  - Few atypical cells present on direct smears (see comment)  - Scant fragments of edematous stromal tissue in cell block  - Bone marrow with trilineage hematopoiesis    Diagnosis:  Lymph nodes, right cervical, levels 2-4, removal and partial parotidectomy  -Metastatic melanoma involving 1 out of 20 lymph nodes, with the largest  diameter measuring 10 mm (1.0 cm) and no evidence of extracapsular extension  (1/20)  -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

## 2020-06-21 NOTE — Unmapped (Signed)
OUTPATIENT ONCOLOGY PALLIATIVE CARE    Principal Diagnosis: Kenneth Fitzgerald is a 45 y.o. male with metastatic melanoma, diagnosed in 90. Disease sites include metastases to brain and abdominal/pelvic lymph nodes.     Assessment/Plan:   1. Fatigue/Insomnia: improved  -Continue Mirtazapine 7.5 mg nightly to target insomnia, appetite, and sleep.     2. Left ear fullness and hearing loss has returned-2 days ago left ear closed up again and hearing loss is present again. Hearing loss is not as bad as it was before, but can appreciate the loss. The procedure-Myringotomy did help initially for about 2 wks  -Will connect with Dr. Manfred Shirts for follow-up.    3. Depression/Anxiety: stable  -Continue Cymbalta 30 mg twice daily per psychiatry  -Continue Mirtazapine 7.5 mg daily    4. Coping: Has support of his family and friends.  Looking forward to an upcoming wedding in August.  Active with [facility] with both Dr. Electa Sniff and Dr. Willaim Bane.  -Monitor  -Uses humor for coping.    5. Advance Care Planning:   -Currently meeting with his lawyer to help with his estate planning and health care advance care planning documentation.    -At prior visits:Shares doesn't want to have to much optimism.  Comments that he is feeling well now, but he is aware that things can change.  Grateful for this time of feeling well.      At prior visits: Patient has an old advance directive scanned into chart, which has his father listed as his healthcare decision maker (who is now deceased).  He states that his sister, Kenneth Fitzgerald, is now his designated Oceanographer.  He has been given a copy of the [place] advance directive (prepare for your care) at a prior visit.  Plan:  -Will follow up at future visit on [name]'s thoughts and willingness to complete another advanced directive    F/u: PRN for now    ----------------------------------------  Referring Provider: Dr. Lendell Caprice  Oncology Team: Dr. Nedra Hai, Dr. Despina Hick  PCP: Ralene Cork, DO      HPI: 45 year old white male with metastatic melanoma (Mets to lymph nodes and brain), history of thyroid cancer, history of portal vein thrombosis, and depression who presents for initial palliative care visit.  Dr. Nedra Hai with heme-onc referred patient for symptom management.  Patient also follows with Dr. Willaim Bane and Dr. Electa Sniff for his depression and anxiety.  Patient reports that in general he has been doing okay, but he has had decreased energy and fatigue.  Patient notes he works approximately 60 to 70 hours/week.  His fatigue has been worsening over the last few months.  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 In regards to patient's prognosis, he states that he doesn't want to know timeline unless his death is imminent.    Current cancer-directed therapy: Arva Chafe      Interval history, 04/19/20 - In person, VR + [name]  - Admitted for SBO 1/4 - 02/02/20, Open primary ventral hernia repair. BMs now regular; no constipation.  - Recently finished course of dexamethasone (taper finished this past weekend) for headaches related to brain mets; states headaches have not been bothering him recently  - Mood/anxiety: follows with [facility]; prescribed duloxetine 30 mg BID  - Nausea/dizziness: Has occasional dizzy spells. Also has episodes  of nausea that lead to dry heaving. Feels this episodes are related to mucus pockets in sinus. States these episodes are sporadic and has not noticed any pattern provoking episodes. Some days will have no episodes; other days will have multiple episodes per day. States happens on average 3 days/week and each episode lasts 15-20 min. States hard to tell if Zofran helping.   - Fatigue/insomnia: feels he has not been sleeping well for the past few months; falls asleep but has trouble staying asleep. Does report that his energy has been gradually improving since hospitalization.  - Appetite: feels like this has decreased in the last few months; eating less than he used to  - Coping/support: Recently discussed prognosis with Dr. Nedra Hai; states he is doing okay processing information; connected to [facility] for support as well      Interval hx 06/21/20 CK, [name] and Dr. Nedra Hai  -Brain MRI and CT chest are stable.  -CT of abd is improved  -Doing ok  -2 days ago left ear closed up again and hearing loss is present again. Hearing loss is not as bad as it was before, but can appreciate the loss. The procedure-Myringotomy did help initially for about 2 wks.   -Still working full time 45-50 hours/week  -No nausea   -Energy slowly getting better  -HS sleeping better-remeron is helpful.  -Mild, occasional headaches-not like before.  -Wgt is good/stable. Still losing wgt-may be a 1-2 lb since last visit. Stopped drinking sweet drinks. Appetite good.  -Rare twitching in upper arms.  -Feels the the bms are ok-normal form-QD  -Mood is good.      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 [place] [facility])    Advance Care Planning:   HCPOA: sister, Kenneth Fitzgerald  Natural surrogate decision maker: sister, Kenneth Fitzgerald & brother, Kenneth Fitzgerald  Living Will: yes  ACP note: no    Objective     Oncology History   Malignant melanoma, metastatic (CMS-HCC)   03/17/2013 Initial Diagnosis    Melanoma (CMS-HCC)     02/12/2018 - 03/12/2018 Chemotherapy    OP NIVOLUMAB 480 MG Q4W  nivolumab 480 mg every 28 days     Malignant neoplasm of brain, unspecified location (CMS-HCC)   12/22/2019 Initial Diagnosis    Malignant neoplasm of brain, unspecified location (CMS-HCC)     12/22/2019 -  Radiation    Radiation Therapy Treatment Details (Noted on 12/22/2019)  Site: Brain  Technique: IMRT  Goal: No goal specified  Planned Treatment Start Date: No planned start date specified         Patient Active Problem List   Diagnosis   ??? 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 Malignant neoplasm of thyroid gland (CMS-HCC)   ??? Postoperative hypothyroidism   ??? Hypocalcemia   ??? Thyroid cancer (CMS-HCC)   ???  Malignant melanoma, metastatic (CMS-HCC)   ??? Ventral hernia with bowel obstruction   ??? Insomnia   ??? Crohn's disease of large intestine without complication (CMS-HCC)   ??? Malignant neoplasm of brain, unspecified location (CMS-HCC)   ??? Gout   ??? 01/24/2020: Open, primary ventral hernia repair for SBO   ??? Morbid obesity with BMI of 40.0-44.9, adult (CMS-HCC)       Past Medical History:   Diagnosis Date   ??? Cancer (CMS-HCC)     melanoma, thyroid cancer   ??? Disease of thyroid gland     hypothyroid   ??? Hypothyroidism    ??? Skin cancer        Past Surgical History:   Procedure Laterality Date   ??? PR COLONOSCOPY W/BIOPSY SINGLE/MULTIPLE N/A 01/09/2018    Procedure: COLONOSCOPY, FLEXIBLE, PROXIMAL TO SPLENIC FLEXURE; WITH BIOPSY, SINGLE OR MULTIPLE;  Surgeon: Vonda Antigua, MD;  [facility];  Service: Gastroenterology   ??? PR COLONOSCOPY W/BIOPSY SINGLE/MULTIPLE N/A 08/24/2019    Procedure: COLONOSCOPY, FLEXIBLE, PROXIMAL TO SPLENIC FLEXURE; WITH BIOPSY, SINGLE OR MULTIPLE;  Surgeon: Leland Her, MD;  [facility];  Service: Gastroenterology   ??? PR COLSC FLX WITH DIRECTED SUBMUCOSAL NJX ANY SBST N/A 08/24/2019    Procedure: COLONOSCOPY, FLEXIBLE, PROXIMAL TO SPLENIC FLEXURE; WITH DIRECTED SUBMUCOSAL INJECTION(S), ANY SUBSTANCE;  Surgeon: Leland Her, MD;  [facility];  Service: Gastroenterology   ??? PR EXPLORATORY OF ABDOMEN N/A 01/24/2020    Procedure: EXPLORATORY LAPAROTOMY, EXPLORATORY CELIOTOMY WITH OR WITHOUT BIOPSY(S);  Surgeon: [name], MD;  Location: [facility]IN OR [facility];  Service: Trauma   ??? PR FREEING BOWEL ADHESION,ENTEROLYSIS N/A 01/24/2020    Procedure: ENTEROLYSIS (SEPART PROC);  Surgeon: [name], MD;  Location: MAIN OR [facility];  Service: Trauma   ??? PR IMPLANT MESH HERNIA REPAIR/DEBRIDEMENT CLOSURE N/A 01/03/2015    Procedure: IMPLANTATION OF MESH/OTHER PROSTHES INCISION/VENTRAL HERNIA REPAIR/MESH CLOSE DEBRID NECROT SOFT TIS INFECT;  Surgeon: Romero Belling, MD;  Location: MAIN OR [place];  Service: Gastrointestinal   ??? PR LAP, VENTRAL HERNIA REPAIR,REDUCIBLE N/A 04/07/2014    Procedure: LAPAROSCOPY, SURGICAL, REPAIR, VENTRAL, UMBILICAL, SPIGELIAN OR EPIGASTRIC HERNIA, REDUCIBLE;  Surgeon: Romero Belling, MD;  Location: MAIN OR [place];  Service: Gastrointestinal   ??? PR NEGATIVE PRESSURE WOUND THERAPY DME >50 SQ CM N/A 01/24/2020    Procedure: NEG PRESS WOUND TX (VAC ASSIST) INCL TOPICALS, PER SESSION, TSA GREATER THAN/= 50 CM SQUARED;  Surgeon: [name], MD;  Location: MAIN OR [place];  Service: Trauma   ??? PR REMOVAL NODES, NECK,CERV CMPLT Left 12/07/2012    Procedure: CERVICAL LYMPHADENECTOMY (COMPLETE);  Surgeon: Charlott Rakes, MD;  Location: [facility];  Service: Surgical Oncology   ??? PR REMOVAL NODES, NECK,CERV MOD RAD Right 03/29/2013    Procedure: CERVICAL LYMPHADENECTOMY (MODIFIED RADICAL NECK DISSECTION);  Surgeon: Charlott Rakes, MD;  Location: MAIN OR [facility];  Service: Surgical Oncology   ??? PR REPAIR RECURR INCIS HERNIA,REDUC N/A 01/03/2015    Procedure: REPAIR RECURRENT INCISIONAL OR VENTRAL HERNIA; REDUCIBLE;  Surgeon: Romero Belling, MD; Location: MAIN OR [place];  Service: Gastrointestinal   ??? PR REPAIR RECURR INCIS HERNIA,STRANG N/A 06/21/2016    Procedure: REPAIR RECURRENT INCISIONAL OR VENTRAL HERNIA; INCARCERATED OR STRANGULATED;  Surgeon: Mickle Asper, MD;  Location: MAIN OR [place];  Service: Gastrointestinal   ??? PR REPAIR RECURR INCIS HERNIA,STRANG N/A 01/24/2020    Procedure: REPAIR RECURRENT INCISIONAL OR VENTRAL HERNIA; INCARCERATED OR STRANGULATED;  Surgeon: [name], MD;  Location: [facility];  Service: Trauma   ??? PR SIGMOIDOSCOPY,FINE NEEDL BX,US GUIDED N/A 01/09/2018    Procedure: SIGMOIDOSCOPY, FLEXIBLE, W/TRANSENDOSCOPIC ULTRASOUND GUIDED NEEDLE ASPIRATION;  Surgeon: Vonda Antigua, MD;  [facility];  Service: Gastroenterology       Current Outpatient Medications   Medication Sig Dispense Refill   ??? acetaminophen (TYLENOL) 500 MG tablet Take 2 tablets (1,000 mg total) by mouth every eight (8) hours as needed for pain. 30 tablet 0   ??? binimetinib ([name]) 15 mg tablet Take 3 tablets (45 mg total) by mouth Two (2) times a day. 180 tablet 11   ??? blood sugar diagnostic Strp Dispense 100 blood glucose test strips, ok to sub any brand preferred by insurance/patient, use 3x/day; dispense whatever brand matches with meter. 100 strip 12   ??? blood-glucose meter kit Use as instructed; dispense 1 meter, whatever is preferred by insurance 1 each 1   ??? calcitrioL (ROCALTROL) 0.25 MCG capsule Take 1 capsule (0.25 mcg total) by mouth daily. 90 capsule 3   ??? calcium carbonate 650 mg calcium (1,625 mg) tablet Take 1 tablet by mouth Three (3) times a day with a meal.     ??? DULoxetine (CYMBALTA) 30 MG capsule TAKE 1 CAPSULE (30 MG TOTAL) BY MOUTH TWO (2) TIMES A DAY. 180 capsule 2   ??? encorafenib (BRAFTOVI) 75 mg capsule Take 6 capsules (450 mg total) by mouth daily. 180 capsule 11   ??? fexofenadine (ALLEGRA ALLERGY) 180 MG tablet Take 180 mg by mouth daily as needed (allergies).      ??? fluticasone propionate (FLONASE) 50 mcg/actuation nasal spray 2 sprays into each nostril two (2) times a day. 16 g 6   ??? folic acid (FOLVITE) 1 MG tablet Take 1 tablet (1 mg total) by mouth daily. 30 tablet 11   ??? ibuprofen (ADVIL,MOTRIN) 200 MG tablet Take 600 mg by mouth as needed in the morning for pain.     ??? lancets Misc Dispense 100 lancets, ok to sub any brand preferred by insurance/patient, use 3x/day 100 each 12   ??? levothyroxine (SYNTHROID) 125 MCG tablet Take 2 tablets (250 mcg total) every Sunday (ie 1 day per week). Take 3 tablets (375 mcg total) on Monday-Saturday (ie 6 days per week). 270 tablet 3   ??? memantine (NAMENDA) 10 MG tablet Take 10 mg by mouth daily.     ??? memantine (NAMENDA) 10 MG tablet TAKE 1 TABLET BY MOUTH TWO TIMES A DAY. 180 tablet 4   ??? mirtazapine (REMERON) 7.5 MG tablet Take 1 tablet (7.5 mg total) by mouth nightly. 30 tablet 3   ??? ondansetron (ZOFRAN) 8 MG tablet Take 1 tablet by mouth Take as directed.     ??? sulfaSALAzine (AZULFIDINE) 500 mg tablet Take 2 tablets (1000mg ) by mouth twice daily for 7 days, then take 4 tablets (2000mg ) twice daily thereafter. 240 tablet 11     No current facility-administered medications for this visit.       Allergies:   Allergies   Allergen Reactions   ??? Compazine [Prochlorperazine] Itching   ??? Coconut Nausea And Vomiting   ??? Multihance [Gadobenate Dimeglumine] Other (See Comments)     Patient sneezed immediately after administration of contrast.       Family History:  Cancer-related family history includes Colon cancer in his paternal grandmother; Prostate cancer in his father.  He indicated that the status of his mother is unknown. He  indicated that the status of his father is unknown. He indicated that the status of his paternal grandmother is unknown. He indicated that the status of his neg hx is unknown.      REVIEW OF SYSTEMS:  A comprehensive review of 10 systems was negative except for pertinent positives noted in HPI.    Vital signs for this encounter: VS reviewed in EPIC.  GEN: Awake and alert & in no acute distress  PSYCH: Alert and oriented to person, place and time. Euthymic.  HEENT: Pupils equally round without scleral icterus. No facial asymmetry.  LUNGS: No increased work of breathing.  SKIN: No rashes, petechiae or jaundice noted on visible skin  EXT: No edema noted of the lower extremities         Lab Results   Component Value Date    CREATININE 0.93 05/10/2020     Lab Results   Component Value Date    ALKPHOS 86 05/10/2020    BILITOT 0.2 (L) 05/10/2020    BILIDIR 0.20 01/27/2020    PROT 6.8 05/10/2020    ALBUMIN 3.8 05/10/2020    ALT 29 05/10/2020    AST 24 05/10/2020            Pam Drown, FNP-BC, [facility]  Outpatient Oncology Palliative Care Service  [facility]  [address], [place], [place] [address]  [phone]      Time spent with patient was 30 minutes.  Additional  15 minutes were spent on preparation, documenting and coordinating care.

## 2020-06-22 MED FILL — SULFASALAZINE 500 MG TABLET: 30 days supply | Qty: 240 | Fill #5

## 2020-06-22 MED FILL — MEKTOVI 15 MG TABLET: ORAL | 30 days supply | Qty: 180 | Fill #8

## 2020-06-22 MED FILL — BRAFTOVI 75 MG CAPSULE: ORAL | 30 days supply | Qty: 180 | Fill #8

## 2020-06-22 NOTE — Unmapped (Signed)
Radiation Oncology Follow Up Visit Note    Patient Name: Kenneth Fitzgerald [facility]  Patient Age: 45 y.o.  Encounter Date: 06/21/2020    Referring Physician:   [name], MD  [address]  Medicine  [id number] Physician Office Building  [place],  [place] [address]    Primary Care Provider:  Ralene Cork, DO    Diagnoses:  1. Malignant neoplasm of brain, unspecified location (CMS-HCC)        Treatment Site: whole brain to 30Gy completed 01/20/20  Right neck to 48 Gy completed 07/09/13    Interval Since Completion of Treatment: 6 months since WBRT      Assessment: Kenneth Fitzgerald is a 45 y.o. 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    He originally had a Stage III scalp melanoma in 2012, pT2pN2, s/p WLE and 3/3 positive SLN's, completion right level 2 neck dissection on 11/06/10 (0/25), one year of interferon completed 01/2012, and then right neck dissection 03/29/13 showing 1+/20 nodes (1cm with no ECE).     He also has multiply recurrent thyroid cancer in the same right neck dissection and is s/p RAI on 05/21/13.  Completed post-op EBRT to right neck to 48 Gy 07/09/13.    He has had stable extracranial disease on dabrafenib/trametinib and was noted to have multiple punctate lesions (best seen on T1 noncontrast sequence) in the brain suspicious for metastasis that have been increasing in number since January 2020.  He completed WBRT to 30 Gy on 01/20/20.    At the end of his course of WBRT he developed a SBO and required surgery.  He has recovered pretty well from that.    Today's MRI demonstrates intracranial control.  CT chest was NED.  MRI abd/pelvis stable.         Plan:     -Disease Status: intracranial control, NED chest on scans today; Stable in Ab/Pelv     -Care Plan: systemic therapy per Dr Nedra Hai     -Symptoms management: follow up with Dr Ralene Ok in ENT for persistent L ear fullness    -Continue memantine for at least 6 months from WBRT, may continue beyond     -Follow-up: 3 months with MRI brain      Interval History:  Kenneth Fitzgerald returns today for a regularly scheduled follow-up, he was last seen in clinic about 2 months ago.     --Interval History:   At last visit nausea was problematic, this has improved significantly.    Left ear fullness improved following myringotomy with ENT but after 2 weeks has returned.  Will follow up with ENT later this month.    Working close to 50 hrs/week.    Has lost 40lbs, holding steady.    Overall feeling well.    Works with Dr Electa Sniff and Dr Willaim Bane, has had good experiences with [employer].    Review of Systems: All other systems reviewed are negative. Pertinent positives and negatives are above in interval history.    Past Medical, Surgical, Family and Social Histories reviewed and updated in the electronic medical record.    Oncology History   Malignant melanoma, metastatic (CMS-HCC)   03/17/2013 Initial Diagnosis    Melanoma (CMS-HCC)     02/12/2018 - 03/12/2018 Chemotherapy    OP NIVOLUMAB 480 MG Q4W  nivolumab 480 mg every 28 days     Malignant neoplasm of brain, unspecified location (CMS-HCC)   12/22/2019 Initial Diagnosis  Malignant neoplasm of brain, unspecified location (CMS-HCC)     12/22/2019 -  Radiation    Radiation Therapy Treatment Details (Noted on 12/22/2019)  Site: Brain  Technique: IMRT  Goal: No goal specified  Planned Treatment Start Date: No planned start date specified           Physical Exam:  Vitals:    06/21/20 1315   Temp: 36.6 ??C (97.9 ??F)   TempSrc: Temporal   Weight: (!) 134.2 kg (295 lb 12.8 oz)     ECOG: 80, Normal activity with effort; some signs or symptoms of disease (ECOG equivalent 1)  General/Constitutional: Well-appearing, NAD   HEENT: Normocephalic, atraumatic, no scleral icterus, missing teeth in back of mouth   Skin: No suspicious lesions or rashes  Pulmonary: No respiratory distress or increased work of breathing   Abdominal: Non distended  Musculoskeletal: Full range of motion in the extremities, without edema Neurologic: Alert and oriented to conversation.  CN II-XII grossly intact.    Psychiatric: Appropriate affect and judgement        Radiology  CT Chest W Contrast    Result Date: 06/21/2020  EXAM: CT CHEST W CONTRAST DATE: 06/21/2020 10:20 AM ACCESSION: [id number] UN DICTATED: 06/21/2020 10:44 AM INTERPRETATION LOCATION: [facility]     CLINICAL INDICATION: 45 year old male with C43.9 - Malignant melanoma, unspecified site (CMS - HCC) .     COMPARISON: None     TECHNIQUE: Contiguous 0.6 and 3mm axial images were reconstructed through the chest following a single breath hold helical acquisition during the administration of intravenous contrast material.  Images were reformatted in the axial and sagittal planes.  MIP slabs were also constructed.     FINDINGS:     LUNGS AND AIRWAYS: The lungs are clear.  The central airways are patent.     PLEURA: No pleural fluid or pneumothorax.     MEDIASTINUM AND LYMPH NODES: No enlarged intrathoracic or axillary lymph nodes.  No other mediastinal abnormality.     HEART AND VASCULATURE:The cardiac chambers are normal in size.  There is no pericardial effusion.  Ascending and descending aorta normal in caliber.  Pulmonary artery normal in size.. Coronary calcification.     BONES AND SOFT TISSUES: Dermal inclusion cyst posterior soft tissues of the back without change series 3 image 195.     UPPER ABDOMEN: Unremarkable.     OTHER: No other significant findings.                 No intrathoracic metastasis.             MRI Brain W Wo Contrast    Result Date: 06/21/2020  EXAM: Magnetic resonance imaging, brain, without and with contrast material. DATE: 06/21/2020 10:02 AM ACCESSION: [id number] UN DICTATED: 06/21/2020 11:27 AM INTERPRETATION LOCATION: [facility]     CLINICAL INDICATION: 45 years old Male with melanoma  - C43.9 - Malignant melanoma, unspecified site (CMS - HCC)      COMPARISON: 04/19/2020 brain MRI     TECHNIQUE: Multiplanar, multisequence MR imaging of the brain was performed without and with I.V. contrast.     FINDINGS:  Small number of nonspecific high signal intensity foci on T2/FLAIR sequences in the centrum semiovale with associated  mild to moderate periventricular hyperintensity, especially silhouetting the occipital extension of each lateral ventricle. These findings usually related to small vessel arteriosclerosis.     Ventricles are normal in size. There is no midline shift. No extra-axial fluid collection. No evidence of intracranial  hemorrhage. No diffusion weighted signal abnormality to suggest acute infarct.     Innumerable number of micrometastasis is seen scattered throughout the cerebral and cerebellar hemispheres. The distribution and number of the lesions are not definitely changed. Lesions vary in the juxtacortical position above the white matter, within the deep gray matter of the basal ganglia, and in the cerebellar hemispheres, left greater than right.     Some of these tiny metastasis are associated with small microhemorrhage in the cerebral and cerebellar hemispheres.     Opacification of the left maxillary sinus is noted with fluid thickening. Left mastoid effusion     Mucous retention cyst noted in right maxillary sinus.         No change with numerous micrometastasis noted in the cerebral and cerebellar hemispheres some of which are associated with microhemorrhage.    MRI Abdomen Pelvis W Wo Contrast    Result Date: 06/21/2020  EXAM: MRI abdomen and pelvis with and without contrast DATE: 06/21/2020 10:02 AM ACCESSION: [id number] UN DICTATED: 06/21/2020 10:08 AM INTERPRETATION LOCATION: [facility]     CLINICAL INDICATION: 45 years old Male with melanoma  - C43.9 - Malignant melanoma, unspecified site (CMS - HCC)      COMPARISON: MRI abdomen pelvis dated 322     TECHNIQUE: MRI of the abdomen and pelvis was obtained with and without IV contrast. Multisequence, multiplanar images were obtained.   CONTRAST: 20mL of  Multihance     FINDINGS: LOWER CHEST: Please see same-day CT chest for intrathoracic details.     HEPATOBILIARY: Multiple unchanged areas of increased T1 signal intensity in hepatic segment 8 measuring up to 1.3 cm with capsular retraction, similar to prior (27:58, 69, 70, 79). There is no associated restricted diffusion in these areas or definite T2 correlate.     Lobulated appearance of the gallbladder is again identified. No evidence of biliary obstruction.     PANCREAS: Pancreatic divisum. The main pancreatic duct is nondilated. No new focal pancreatic mass.     SPLEEN: Mildly enlarged measuring 14.3 cm. No focal lesion is identified     ADRENAL GLANDS: No large measurable lesion is identified.     KIDNEYS/URETERS: Symmetric contrast enhancement. No renal collecting system dilatation. No enhancing renal lesion is identified     BLADDER: Decompressed, limiting evaluation.     REPRODUCTIVE ORGANS: The prostate gland is mildly enlarged measuring 5.5 cm in diameter. The seminal vesicles are symmetric and appear grossly unremarkable.     GI TRACT: Tiny paraesophageal type hiatal hernia. No dilated loops of bowel. No acute inflammatory process.     PERITONEUM/RETROPERITONEUM AND MESENTERY: 2 perihepatic/hepatic capsular implants are again seen measuring 2.1 cm and 2.4 cm, decreased in size compared to prior, previously measuring 2.3 and 3 cm, respectively (32:51, 54). Additional smaller mesenteric nodules and/or lymph nodes are also stable. For example, anterior abdomen on series 33 image 15.     VASCULATURE: Normal caliber of the abdominal aorta. 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 The remainder of the portal venous system is patent. The origins of the celiac axis, SMA and renal arteries are patent.     LYMPH NODES: Stable borderline enlarged 1.6 cm left inguinal lymph node. Multiple prominent left external iliac chain lymph nodes, the largest of which is borderline enlarged and measures 1 cm. BONES/SOFT TISSUES: There are postsurgical changes along the midline anterior abdominal wall. Redemonstrated rim-enhancing fluid collection along the midline surgical incision, decreased in  size compared to prior, now measuring 1.8 x 0.9 cm, previously measuring 7.5 x 4.2 cm (37:82).     There is a fat-containing left inguinal hernia that contains a loculated cysts/fluid, unchanged.     Previously described T2 hyperintense lesion within the left psoas muscle is again seen, though markedly less prominent and less well-defined compared to prior, with persistent rim enhancement (15:42)     Redemonstrated nonenhancing lesion within the left aspect of the L2 vertebral body, decreased in size compared to prior, now measuring 2.2 cm, previously 2.6 cm (27:26). No enhancing osseous lesion is identified.                         1.Postsurgical changes along the midline anterior wall with a rim-enhancing fluid collection located within the surgical incision, significantly decreased in size compared to prior. 2.Perihepatic/capsular implants appear slightly decreased in size. Otherwise, stable hepatic and peritoneal metastases. 3. Previously described left psoas muscle intramuscular lesion or collection is significantly decreased, and not well delineated on the current exam.     I have reviewed the images and interpretation with the radiology resident/fellow and agree with the resident report as above.            Electronically signed by:  Sydnee Levans, PA-C  Department of Radiation Oncology  [facility]  [address], [id number]  [place], [place] [address]  O: [phone]  June 21, 2020 5:10 PM          ATTENDING ATTESTATION:    I saw and evaluated/examined the patient, participating in the key portions of the service.  I discussed the findings, assessment, and plan of care with the physician assistant.  I have edited and agree with the findings and plan as documented in the physician assistant's note.      I personally reviewed all relevant data associated with this encounter including imaging, labwork, and prior records.     Mina Marble, MD, PhD  Assistant Professor  Department of Radiation Oncology  [facility]  [address], [address]  [place], [place] [address]  O: [phone]  P: [phone]

## 2020-06-26 ENCOUNTER — Encounter: Admit: 2020-06-26 | Discharge: 2020-06-27 | Payer: PRIVATE HEALTH INSURANCE | Attending: Clinical | Primary: Clinical

## 2020-06-26 DIAGNOSIS — F330 Major depressive disorder, recurrent, mild: Principal | ICD-10-CM

## 2020-06-26 DIAGNOSIS — C801 Malignant (primary) neoplasm, unspecified: Principal | ICD-10-CM

## 2020-06-26 DIAGNOSIS — F411 Generalized anxiety disorder: Principal | ICD-10-CM

## 2020-06-26 NOTE — Unmapped (Signed)
[facility]  Psychiatry  Psychotherapy Note - Telehealth via Video    Service Date: June 26, 2020  Service: 50 minutes of psychotherapy via video session  Time with Patient: 50 minutes    Encounter Description: This encounter was conducted from provider's office via EPIC video session due to COVID-19 pandemic. Kenneth Fitzgerald was located in his home. Rationale for video session is need to social distance. See Plan for telemedicine consent/disclaimer.     Encounter Description/Consent:   Kenneth Fitzgerald visit was completed through telehealth encounter (video).      This patient encounter is appropriate and reasonable under the circumstances. The patient has been advised of the potential risks and limitations of this mode of treatment (including, but not limited to, the absence of in-person examination) and has agreed to be treated in a remote fashion in spite of them. Any and all of the patient's/patient's family's questions on this issue have been answered.     The patient was physically located in [place] in which I am permitted to provide care. The patient understood that he may incur co-pays and cost sharing, and agreed to the telemedicine visit. The visit was reasonable and appropriate under the circumstances given the patient's presentation at the time.      Time Spent: 30 minutes    Assessment:  Kenneth Fitzgerald is a 45yo male with metastatic melanoma, h/o thyroid cancer, recent portal vein thrombosis, who I see in therapy to address depression and enhance coping with multiple stressors.  We met today via telehealth (video). Kenneth Fitzgerald reported feeling encouraged by the latest scan results reports gradually improving energy levels. He endorsed stable psychosocial functioning that has been sw boosted by scan result; no pressing psych concerns. He still appreciates his challenging long-term prognosis.  Kenneth Fitzgerald reported tending to legal, estate, and disability matters with the goal of living a meaningful life.  I offered psychotherapy, support, and recommendations.  Will see next in ~ one month.    Risk Assessment:  A suicide and violence risk assessment was performed as part of this evaluation. There patient is deemed to be at chronic elevated risk for self-harm/suicide given the following factors: current diagnosis of depression, suicidal ideation or threats without a plan and past diagnosis of depression. There patient is deemed to be at chronic elevated risk for violence given the following factors: male gender. These risk factors are mitigated by the following factors: lack of active SI/HI. There is no acute risk for suicide or violence at this time. The patient was educated about relevant modifiable risk factors including following recommendations for treatment of psychiatric illness and abstaining from substance abuse. While future psychiatric events cannot be accurately predicted, the patient does not currently require acute inpatient psychiatric care and does not currently meet [facility] involuntary commitment criteria.             Plan:  1. I will continue to see pt for psychotherapy.  Next session is scheduled to take place in ~ one month.  2. Pt has information on available [facility] resources. Pt is working with [name], [name].  I have previously reached out to [name] to explore financial resources.  3. Pt has been followed by Dr. Lawernce Keas of Psychiatry.  4. Pt has my contact information and knows to reach me as needed.    Subjective:   Kenneth Fitzgerald presented as alert and oriented.  Affect was relatively bright and he was engaged throughout the session.  Kenneth Fitzgerald reported receiving scan results last week, which he characterized as my brain is stable, and my chest and abdomen findings were unremarkable.  He noted being encouraged and stated I'm trying not to get my hopes up too much, but I can celebrate a little bit.  He stated that he is breathing a little easier.      Kenneth Fitzgerald reported largely stable overall psychosocial functioning in the weeks since our last session.  He denied experiencing clinically significant depressive symptoms and described his overall mood as pretty decent.  He has remained buys with his work and with church, and has been visiting with friends and family members - including a weeklong trip to [place] last month.  When asked about his energy level, Kenneth Fitzgerald stated I'm not where I used to be, but better than where I was a few months ago.  I['m getting better a little every day.  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  He again noted that the aim of improving the state of his home has been put on the back burner; described plans to tend to this week in th evenings. He acknowledged that this is lower on his priority list and that more pressing things usually come up.    Kenneth Fitzgerald is taking Cymbalta and Mirtazapine as prescribed.    I provided Kenneth Fitzgerald with support and understanding, and we discussed and processed his thoughts and feelings again in some depth.  We problem-solved and reviewed approaches to address and tolerate distress and depressive symptoms, to enhance his adaptive coping, and to achieve his stated goals.  Kenneth Fitzgerald was engaged and receptive. We will meet next in ~ a month.    Mental Status Exam:  Appearance:    Appears stated age   Motor:   No abnormal movements   Speech/Language:    Normal rate, volume, tone, fluency   Mood:   pretty decent   Affect:   engaged; relatively bright   Thought process:   Logical, linear, clear, coherent, goal-directed   Thought content:     Denies SI/HI or thought of self-harm   Perceptual disturbances:     Behavior not concerning for response to internal stimuli     Orientation:   Oriented to person, place, time, and general circumstances   Attention:   Able to fully attend without fluctuations in consciousness   Concentration: Able to fully concentrate and attend   Memory:   Immediate, short-term, long-term, and recall grossly intact    Fund of knowledge:    Consistent with level of education and development   Insight:     Intact   Judgment:    Intact   Impulse Control:   Intact     Diagnosis: Recurrent major depressive disorder;  Anxiety associated with cancer diagnosis    [name]. Thurmond Hildebran PhD  June 26, 2020

## 2020-07-08 DIAGNOSIS — F32A Depression, unspecified depression type: Principal | ICD-10-CM

## 2020-07-08 MED ORDER — MIRTAZAPINE 7.5 MG TABLET
ORAL_TABLET | 2 refills | 0 days
Start: 2020-07-08 — End: ?

## 2020-07-10 DIAGNOSIS — F32A Depression, unspecified depression type: Principal | ICD-10-CM

## 2020-07-10 DIAGNOSIS — G4700 Insomnia, unspecified: Principal | ICD-10-CM

## 2020-07-10 MED ORDER — MIRTAZAPINE 7.5 MG TABLET
ORAL_TABLET | Freq: Every evening | ORAL | 2 refills | 0.00000 days | Status: CP
Start: 2020-07-10 — End: ?

## 2020-07-10 NOTE — Unmapped (Signed)
Script for remeron sent for 90 day supply. No refills. Our team will be prn. Seen last month by this provider.

## 2020-07-10 NOTE — Unmapped (Signed)
Patient need refill if appropriate.

## 2020-07-10 NOTE — Unmapped (Unsigned)
Kenneth Fitzgerald is a 45 y.o. male who presents for the evaluation of a left sided middle ear effusion in the setting of allergic rhinitis. He has been seeing Dr. Ralene Ok for mucus retention cysts and nasal drainage. He was noted to have a left sided middle ear effusion, conductive hearing loss and underwent myringotomy on 06/08/20.     - left ear tube?    Past Medical History  He  has a past medical history of Cancer (CMS-HCC), Disease of thyroid gland, Hypothyroidism, and Skin cancer.    Past Surgical History  His  has a past surgical history that includes pr removal nodes, neck,cerv cmplt (Left, 12/07/2012); pr removal nodes, neck,cerv mod rad (Right, 03/29/2013); pr lap, ventral hernia repair,reducible (N/A, 04/07/2014); pr repair recurr incis hernia,reduc (N/A, 01/03/2015); pr implant mesh hernia repair/debridement closure (N/A, 01/03/2015); pr repair recurr incis hernia,strang (N/A, 06/21/2016); pr colonoscopy w/biopsy single/multiple (N/A, 01/09/2018); pr sigmoidoscopy,fine needl bx,us guided (N/A, 01/09/2018); pr colonoscopy w/biopsy single/multiple (N/A, 08/24/2019); pr colsc flx with directed submucosal njx any sbst (N/A, 08/24/2019); pr exploratory of abdomen (N/A, 01/24/2020); pr freeing bowel adhesion,enterolysis (N/A, 01/24/2020); pr repair recurr incis hernia,strang (N/A, 01/24/2020); and pr negative pressure wound therapy dme >50 sq cm (N/A, 01/24/2020).    Past Family History  His family history includes Arrhythmia in his mother; Colon cancer in his paternal grandmother; Coronary artery disease in his father; Diabetes in his father; Hypertension in his father; Hyperthyroidism in his mother; Osteoporosis in his mother; Prostate cancer in his father; Squamous cell carcinoma in his mother.    Past Social History  He  reports that he has never smoked. He has never used smokeless tobacco. He reports current alcohol use. He reports that he does not use drugs.    Medications/Allergies  His current medication(s) include:    Current Outpatient Medications:   ???  acetaminophen (TYLENOL) 500 MG tablet, Take 2 tablets (1,000 mg total) by mouth every eight (8) hours as needed for pain., Disp: 30 tablet, Rfl: 0  ???  binimetinib ([name]) 15 mg tablet, Take 3 tablets (45 mg total) by mouth Two (2) times a day., Disp: 180 tablet, Rfl: 11  ???  blood sugar diagnostic Strp, Dispense 100 blood glucose test strips, ok to sub any brand preferred by insurance/patient, use 3x/day; dispense whatever brand matches with meter., Disp: 100 strip, Rfl: 12  ???  blood-glucose meter kit, Use as instructed; dispense 1 meter, whatever is preferred by insurance, Disp: 1 each, Rfl: 1  ???  calcitrioL (ROCALTROL) 0.25 MCG capsule, Take 1 capsule (0.25 mcg total) by mouth daily., Disp: 90 capsule, Rfl: 3  ???  calcium carbonate 650 mg calcium (1,625 mg) tablet, Take 1 tablet by mouth Three (3) times a day with a meal., Disp: , Rfl:   ???  DULoxetine (CYMBALTA) 30 MG capsule, TAKE 1 CAPSULE (30 MG TOTAL) BY MOUTH TWO (2) TIMES A DAY., Disp: 180 capsule, Rfl: 2  ???  encorafenib (BRAFTOVI) 75 mg capsule, Take 6 capsules (450 mg total) by mouth daily., Disp: 180 capsule, Rfl: 11  ???  fexofenadine (ALLEGRA ALLERGY) 180 MG tablet, Take 180 mg by mouth daily as needed (allergies). , Disp: , Rfl:   ???  fluticasone propionate (FLONASE) 50 mcg/actuation nasal spray, 2 sprays into each nostril two (2) times a day., Disp: 16 g, Rfl: 6  ???  folic acid (FOLVITE) 1 MG tablet, Take 1 tablet (1 mg total) by mouth daily., Disp: 30 tablet, Rfl: 11  ???  ibuprofen (  ADVIL,MOTRIN) 200 MG tablet, Take 600 mg by mouth as needed in the morning for pain., Disp: , Rfl:   ???  lancets Misc, Dispense 100 lancets, ok to sub any brand preferred by insurance/patient, use 3x/day, Disp: 100 each, Rfl: 12  ???  levothyroxine (SYNTHROID) 125 MCG tablet, Take 2 tablets (250 mcg total) every Sunday (ie 1 day per week). Take 3 tablets (375 mcg total) on Monday-Saturday (ie 6 days per week)., Disp: 270 tablet, Rfl: 3  ???  memantine (NAMENDA) 10 MG tablet, Take 10 mg by mouth daily., Disp: , Rfl:   ???  memantine (NAMENDA) 10 MG tablet, TAKE 1 TABLET BY MOUTH TWO TIMES A DAY., Disp: 180 tablet, Rfl: 4  ???  mirtazapine (REMERON) 7.5 MG tablet, Take 1 tablet (7.5 mg total) by mouth nightly., Disp: 30 tablet, Rfl: 3  ???  ondansetron (ZOFRAN) 8 MG tablet, Take 1 tablet by mouth Take as directed., Disp: , Rfl:   ???  sulfaSALAzine (AZULFIDINE) 500 mg tablet, Take 2 tablets (1000mg ) by mouth twice daily for 7 days, then take 4 tablets (2000mg ) twice daily thereafter., Disp: 240 tablet, Rfl: 11  Allergies: Compazine [prochlorperazine], Coconut, and Multihance [gadobenate dimeglumine],    Review of systems   Review of systems was reviewed on attached notes/patient intake forms.      Physical Examination  There were no vitals taken for this visit.    General: well appearing, stated age, no distress   Head - atraumatic, normocephalic   Nose: dorsum midline, no rhinorrhea  Neck: symmetric, trachea midline  Psychiatric: alert and oriented, appropriate mood and affect   Respiratory: no audible wheezing or stridor, normal work of breathing  Neurologic - cranial nerves 2-12 grossly intact  Facial Strength - HB 1/6 bilaterally    Ears - External ear- normal, no lesions, no malformations   Otoscopy - ***  Tuning fork test - ***    Procedures  ***    Audiogram  The audiogram was reviewed. This demonstrates a ***    Tympanogram  ***    Imaging  I personally reviewed the patients imaging studies. ***    I reviewed medical records scanned into media, pertinent findings are summarized in the HPI.    Assessment and Plan  The patient is a 45 y.o. male with ***    The patient/family voiced understanding of the plan as detailed above and is in agreement. I appreciate the opportunity to participate in his care.    I attest to the above information and documentation. However, this note has been created using voice recognition software and may have errors that were not dictated and not seen in editing.    Cheryl Flash MD  Assistant Professor   Division of Otology/Neurotology  Department of [place], [place], [place]

## 2020-07-19 NOTE — Unmapped (Signed)
[facility] Refill Coordination Note    Specialty Medication(s) to be Shipped:   Hematology/Oncology: [name] and [name]    Other medication(s) to be shipped: Sulfasalazine     Kenneth Fitzgerald, DOB: 14-Sep-1975  Phone: There are no phone numbers on file.      All above HIPAA information was verified with patient.     Was a Nurse, learning disability used for this call? No    Completed refill call assessment today to schedule patient's medication shipment from the [facility] [phone]).  All relevant notes have been reviewed.     Specialty medication(s) and dose(s) confirmed: Regimen is correct and unchanged.   Changes to medications: Kenneth Fitzgerald reports no changes at this time.  Changes to insurance: No  New side effects reported not previously addressed with a pharmacist or physician: None reported  Questions for the pharmacist: No    Confirmed patient received a Conservation officer, historic buildings and a Surveyor, mining with first shipment. The patient will receive a drug information handout for each medication shipped and additional FDA Medication Guides as required.       DISEASE/MEDICATION-SPECIFIC INFORMATION        N/A    SPECIALTY MEDICATION ADHERENCE     Medication Adherence    Patient reported X missed doses in the last month: 0  Specialty Medication: Braftovi 75 mg  Patient is on additional specialty medications: Yes  Additional Specialty Medications: [name] 15mg   Patient Reported Additional Medication X Missed Doses in the Last Month: 0  Patient is on more than two specialty medications: No  Informant: patient              Were doses missed due to medication being on hold? No    Braftovi 75 mg: 10 days of medicine on hand   [name] 15 mg: 10 days of medicine on hand       REFERRAL TO PHARMACIST     Referral to the pharmacist: Not needed      [facility]     Shipping address confirmed in Epic.     Delivery Scheduled: Yes, Expected medication delivery date: 07/25/20.     Medication will be delivered via UPS to the prescription address in Epic [place].    [name] M [name]   [facility] Specialty Technician

## 2020-07-24 MED FILL — BRAFTOVI 75 MG CAPSULE: ORAL | 30 days supply | Qty: 180 | Fill #9

## 2020-07-24 MED FILL — SULFASALAZINE 500 MG TABLET: 30 days supply | Qty: 240 | Fill #6

## 2020-07-24 MED FILL — MEKTOVI 15 MG TABLET: ORAL | 30 days supply | Qty: 180 | Fill #9

## 2020-07-31 ENCOUNTER — Encounter: Admit: 2020-07-31 | Discharge: 2020-08-01 | Payer: PRIVATE HEALTH INSURANCE | Attending: Clinical | Primary: Clinical

## 2020-07-31 NOTE — Unmapped (Incomplete)
[facility]  Psychiatry  Psychotherapy Note - Telehealth via??Video  ??  Service Date:??July 18,??2022  Service:??30??minutes of??psychotherapy via??video??session  Time with Patient:??30??minutes  ??  Encounter Description:??This encounter was conducted from provider's office via EPIC video session due to COVID-19 pandemic. Lenord Fralix was located in his home. Rationale for??video session??is need to social distance. See Plan for telemedicine consent/disclaimer.   ??  Encounter Description/Consent:??  [name] visit was completed through telehealth encounter (video).   ??  This patient encounter is appropriate and reasonable under the circumstances. The patient has been advised of the potential risks and limitations of this mode of treatment (including, but not limited to, the absence of in-person examination) and has agreed to be treated in a remote fashion in spite of them. Any and all of the patient's/patient's family's questions on this issue have been answered.   ??  The patient was physically located in [place] in which I am permitted to provide care. The patient??understood that??he may incur co-pays and cost sharing, and agreed to the telemedicine visit. The visit was??reasonable and appropriate under the circumstances given the patient's presentation at the time.   ??  Time Spent: 30??minutes  ??  Assessment:  Mr. Ager a 45yo??male??with metastatic melanoma, h/o thyroid cancer, recent portal vein thrombosis, who I see in therapy to address depression and enhance coping with??multiple??stressors.?? We met today via telehealth (video).  Mr.??Yepez??reported no pressing psychosocial concerns and described sustained improvement in his overall psychosocial functioning.  No pressing psych concerns noted. ??Mr. Lott continues to tend to legal, estate, and disability matters with the goal of living a meaningful life; he expressed hope that his longstanding legal issues may be resolved soon.????I offered psychotherapy, support,??and recommendations. ??Will see next??in??~ one month.  ??  Risk Assessment:  A suicide and violence risk assessment was performed as part of this evaluation. There patient is deemed to be at chronic elevated risk??for self-harm/suicide given the following factors: current diagnosis of depression, suicidal ideation or threats without a plan and past diagnosis of depression. There patient is deemed to be at chronic elevated risk??for violence given the following factors: male gender. These risk factors are mitigated by the following factors: lack of active SI/HI. There is no acute risk for suicide or violence at this time. The patient was educated about relevant modifiable risk factors including following recommendations for treatment of psychiatric illness and abstaining from substance abuse.??While future psychiatric events cannot be accurately predicted, the patient does not currently require acute inpatient psychiatric care and does not currently meet [facility] involuntary commitment criteria.??????????????????  ??  Plan:  1.??I will continue to see pt for psychotherapy. ??Next session??is??scheduled??to take place on 8/15.  2. Pt??has??information on available [facility] resources.??Pt is working with [name], [name]. ??I have previously reached out to [name] to explore financial resources.  3.??Pt??has been followed by Dr. Lawernce Keas of Psychiatry.  4. Pt has??my contact information and knows??to reach me as needed.  ??  Subjective:??  Mr. Cournoyer??presented as alert and oriented.????Affect was relatively bright and, as usual,??he was engaged throughout the session.  ??  Mr. Ida reported good overall psychosocial functioning in the time since our last session. He denied significant depressive or anxiety symptoms at this time. His work continues to be less stressful than before and he has remained meaningfully engaged with church activities and visiting with family.  Mr. Golob recently celebrated his 45 year anniversary from the date he was first diagnosed with cancer - something he  views as an achievement given the prognosis given to him a decade ago.  He continues to feel like he is in a much better place than he was months ago.  Mr. Umland described making some headway on the state of his home; this does not appear to be a matter of high importance for him or one that is leading to distress.    Mr. Berquist??is taking Cymbalta and Mirtazapine as prescribed.  ??  I provided??Mr. Turman with??support and understanding, and we??discussed and processed??his??thoughts and feelings in??some??depth.????We problem-solved??and reviewed approaches to??address??and??tolerate??distress and depressive symptoms, to build on his recent gains, and to achieve his stated goals. ??Mr. Kudrna??was engaged and receptive.??We will meet next month.  ??  Mental Status Exam:  Appearance:  ??  Appears stated age   Motor: ??  No abnormal movements   Speech/Language:  ??  Normal rate, volume, tone, fluency   Mood: ??  I'm good   Affect: ??  engaged; relatively bright   Thought process: ??  Logical, linear, clear, coherent, goal-directed   Thought content:   ??  Denies SI/HI or thought of self-harm   Perceptual disturbances:   ??  Behavior not concerning for response to internal stimuli  ??   Orientation: ??  Oriented to person, place, time, and general circumstances   Attention: ??  Able to fully attend without fluctuations in consciousness   Concentration: ??  Able to fully concentrate and attend   Memory: ??  Immediate, short-term, long-term, and recall grossly intact    Fund of knowledge:  ??  Consistent with level of education and development   Insight:   ??  Intact   Judgment:  ??  Intact   Impulse Control: ??  Intact   ??  Diagnosis: Recurrent major depressive disorder in partial remission; ??Anxiety associated with cancer diagnosis  ??  [name]. Tayllor Breitenstein PhD  July 18,??2022 circumstances   Attention: ??  Able to fully attend without fluctuations in consciousness   Concentration: ??  Able to fully concentrate and attend   Memory: ??  Immediate, short-term, long-term, and recall grossly intact    Fund of knowledge:  ??  Consistent with level of education and development   Insight:   ??  Intact   Judgment:  ??  Intact   Impulse Control: ??  Intact   ??  Diagnosis: Recurrent major depressive disorder; ??Anxiety associated with cancer diagnosis  ??  [name]. Itza Maniaci PhD  July 18,??2022

## 2020-08-11 NOTE — Unmapped (Signed)
[facility] Refill Coordination Note    Specialty Medication(s) to be Shipped:   Hematology/Oncology: [name] and [name]    Other medication(s) to be shipped: Sulfasalazine 500     Kenneth Fitzgerald [place], DOB: 02/07/1975  Phone: There are no phone numbers on file.      All above HIPAA information was verified with patient.     Was a Nurse, learning disability used for this call? No    Completed refill call assessment today to schedule patient's medication shipment from the [facility] [phone]).  All relevant notes have been reviewed.     Specialty medication(s) and dose(s) confirmed: Regimen is correct and unchanged.   Changes to medications: Aboubacar reports no changes at this time.  Changes to insurance: No  New side effects reported not previously addressed with a pharmacist or physician: None reported  Questions for the pharmacist: No    Confirmed patient received a Conservation officer, historic buildings and a Surveyor, mining with first shipment. The patient will receive a drug information handout for each medication shipped and additional FDA Medication Guides as required.       DISEASE/MEDICATION-SPECIFIC INFORMATION        N/A    SPECIALTY MEDICATION ADHERENCE     Medication Adherence    Patient reported X missed doses in the last month: 1  Specialty Medication: Braftovi 75 mg  Patient is on additional specialty medications: Yes  Additional Specialty Medications: [name] 15mg   Patient Reported Additional Medication X Missed Doses in the Last Month: 1  Patient is on more than two specialty medications: No  Informant: patient              Were doses missed due to medication being on hold? No    Braftovi 75 mg: 14 days of medicine on hand   [name] 15 mg: 14 days of medicine on hand       REFERRAL TO PHARMACIST     Referral to the pharmacist: Not needed      [facility]     Shipping address confirmed in Epic.     Delivery Scheduled: Yes, Expected medication delivery date: 08/22/20.     Medication will be delivered via UPS to the prescription address in Epic [place].    Kenneth Fitzgerald M Kenneth Fitzgerald   [facility] Specialty Technician

## 2020-08-14 ENCOUNTER — Encounter: Admit: 2020-08-14 | Payer: PRIVATE HEALTH INSURANCE

## 2020-08-14 NOTE — Unmapped (Unsigned)
Assessment/Plan:    1. T1N1b papillary thyroid cancer (classic/follicular variant), multifocal, s/p 3 surgeries and 2 doses of I-131. Last treatment was in 2015. Has indeterminate response to treatment based on persistent detectable Tg (< 1 on suppression, rose to 2.4 with stimulation in Jan 2018).  Note that he also has stage IV melanoma, followed by oncology.  -Thyrogen-stimulated WBS was negative in 01/2016; stimulated Tg was 2.4  -Tg on suppression has been stable, last check 08/2019, 0.3 at that time with negative antibody.  Repeat Tg/Ab ~ 08/2020.  -10/2018 neck US stable. Repeat US ~ end of 2022.    2. Hypothyroidism, with TSH fluctuating between 0.06 and 23 in 2017-2018 on same doses of thyroid hormone, perhaps related to wt changes, absorption issues, and/or variable adherence at that time  - TFTs less labile from 2018 to 2021, even in the setting of bowel obstruction in 2020  - TSH much higher in 12/2019, and FT4 lower, perhaps due to interim weight gain and higher thyroid hormone needs. Has been on higher dose since then. Repeat TFTs today and adjust dose further if needed. **Follow both TSH and FT4 from now on re: brain XRT x 10 days from 12/2019-01/2020  -Reviewed appropriate administration, and factors that can affect dose needs or laboratory measurement    3. Hypocalcemia, severe, started calcitriol 05/2015. Low calcium in 12/2017 d/t low calcium intake at that time. Normal level in 05/2018 and in 06/2018 off of calcitriol, but significantly low again in 07/2018. Has been back on calcitriol 0.25 mg/day since then.  -Last few Ca checks stable and at or close to goal, no significant symptoms, but he has been out of calcitriol 0.25 mcg daily for 1.5 weeks for unclear reasons (Rx up to date)  -Repeat Ca/albumin/Phos today   -Reviewed goals of treatment and symptoms or signs with which to take extra calcium and/or notify us.     4. Hyperglycemia, new in 03/2018, in the setting of 4 weeks of prednisone to treat a reaction to immunotherapy.  A1c 6.0% in 06/2018.  -A1c 08/2019 and 04/2020 were improved, tried MTF a few months but off since 12/2019 due to significant weight loss (and GI symptoms) following brain XRT   - Repeat A1c with next labs, ~ q 6 mo for now, sooner if restarts steroids or clinical status changes. Glucose values on recent panels have been stable.     There are no Patient Instructions on file for this visit.  No follow-ups on file.     No orders of the defined types were placed in this encounter.            Reason For Visit:   Chief Complaint   Patient presents with   ??? Follow-up thyroid cancer and hypoparathyroidism      Subjective:     History of Present Illness:  Kenneth Fitzgerald is a 45 y.o. male with PTC who was last seen 04/2020.    05/10/20 - visit, taking LT4 to 250 mcg x 2 days/week and 375 mcg x 5, out of calcitriol x 1.5 weeks, no low Ca symptoms, lost 30-40 lbs after brain XRT in 12/2019, RTC 3 mo same day as next Onc visit; incr LT4 to 250 x 1 and 375 x 6; repeat Ca/Phos/alb/TSH/FT4 on 06/21/20 when he sees Onc     Taking LT4 250 mcg x 1 day2/week, and 375 mcg x 6 days/week. Good adherence. No biotin supplements. No overt hypothyroid or hyperthyroid symptoms. No neck pain, swelling,  globus, dysphagia, or dysphonia.    Taking calcitriol 0.25 mcg/day. Taking supplemental calcium 1000 mg/day.  Dietary calcium - 1-2 servings. Has vit D in his ca supplements. No kidney stones. No recent significant hypoCa symptoms.      Stopped MTF 500 mg daily re: taking too many pills per day and also GI symptoms from XRT    Weight trend:  Wt Readings from Last 6 Encounters:   06/21/20 (!) 134.2 kg (295 lb 12.8 oz)   06/21/20 (!) 133.8 kg (294 lb 14.4 oz)   06/08/20 (!) 133.8 kg (295 lb)   05/10/20 (!) 131.5 kg (290 lb)   04/19/20 (!) 132.3 kg (291 lb 9.6 oz)   04/19/20 (!) 132.5 kg (292 lb)       PMH:  1. 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 Thyroidectomy 06/25/2011 with 1 foci each lobe and 2/2 central LN (left Level 6)     1b. Thyrogen-based RAI 125 mCi 03/2012 with pre/post scans showing fairly large focus of bed activity and 4.2% uptake; pretx stimulated Tg of 73     1c. Recurrence on [place] 09/2012 in left thyroid bed and confirmed by FNA 10/2012; left ND in 11/2012 levels 3-6 with 12/16 positive (largest 1.8 cm, and some with extranodal extension)     1d. Right CND 03/2013 levels 2-4 with 3/20 positive (largest 3.5 mm), found incidentally as the right ND was performed for melanoma recurrence which was 1 cm node in 1 of 20 nodes     1e. Thyrogen-based 150 mCi in 05/2013 [LID as well before the treatment]      74f. Thyrogen-stimulated Tg was 2.4 in 01/2016. WBS in 01/2016 was negative.  2. Postsurgical hypothyroidism  3. Stage 3 melanoma - scalp lesion, surgery including RND, and systemic therapy ending 01/2012; recurrence in a 1 cm right LN removed 03/2013 on repeat RND; XRT completed 06/2013  4. Diverticulitis  5. H/o anklyosing spondylitis 2009  6.  Small bowel obstruction, hospitalization 11/2018, due to adhesions from past abdominal surgeries including hernia repairs  7. Elevated A1c of 6% in 06/2018  8. Crohns, diagnosed 09/2019 by Dr. Stevphen Rochester in GI, but likely present since 2019 per note, defer immunosuppressive therapy re: active melanoma, start w sulfasalazine     PSH reviewed in Epic      Current Outpatient Medications:   ???  acetaminophen (TYLENOL) 500 MG tablet, Take 2 tablets (1,000 mg total) by mouth every eight (8) hours as needed for pain., Disp: 30 tablet, Rfl: 0  ???  binimetinib ([name]) 15 mg tablet, Take 3 tablets (45 mg total) by mouth Two (2) times a day., Disp: 180 tablet, Rfl: 11  ???  blood sugar diagnostic Strp, Dispense 100 blood glucose test strips, ok to sub any brand preferred by insurance/patient, use 3x/day; dispense whatever brand matches with meter., Disp: 100 strip, Rfl: 12  ???  blood-glucose meter kit, Use as instructed; dispense 1 meter, whatever is preferred by insurance, Disp: 1 each, Rfl: 1  ???  calcitrioL (ROCALTROL) 0.25 MCG capsule, Take 1 capsule (0.25 mcg total) by mouth daily., Disp: 90 capsule, Rfl: 3  ???  calcium carbonate 650 mg calcium (1,625 mg) tablet, Take 1 tablet by mouth Three (3) times a day with a meal., Disp: , Rfl:   ???  DULoxetine (CYMBALTA) 30 MG capsule, TAKE 1 CAPSULE (30 MG TOTAL) BY MOUTH TWO (2) TIMES A DAY., Disp: 180 capsule, Rfl: 2  ???  encorafenib (BRAFTOVI) 75  mg capsule, Take 6 capsules (450 mg total) by mouth daily., Disp: 180 capsule, Rfl: 11  ???  fexofenadine (ALLEGRA ALLERGY) 180 MG tablet, Take 180 mg by mouth daily as needed (allergies). , Disp: , Rfl:   ???  fluticasone propionate (FLONASE) 50 mcg/actuation nasal spray, 2 sprays into each nostril two (2) times a day., Disp: 16 g, Rfl: 6  ???  folic acid (FOLVITE) 1 MG tablet, Take 1 tablet (1 mg total) by mouth daily., Disp: 30 tablet, Rfl: 11  ???  ibuprofen (ADVIL,MOTRIN) 200 MG tablet, Take 600 mg by mouth as needed in the morning for pain., Disp: , Rfl:   ???  lancets Misc, Dispense 100 lancets, ok to sub any brand preferred by insurance/patient, use 3x/day, Disp: 100 each, Rfl: 12  ???  levothyroxine (SYNTHROID) 125 MCG tablet, Take 2 tablets (250 mcg total) every Sunday (ie 1 day per week). Take 3 tablets (375 mcg total) on Monday-Saturday (ie 6 days per week)., Disp: 270 tablet, Rfl: 3  ???  memantine (NAMENDA) 10 MG tablet, Take 10 mg by mouth daily., Disp: , Rfl:   ???  memantine (NAMENDA) 10 MG tablet, TAKE 1 TABLET BY MOUTH TWO TIMES A DAY., Disp: 180 tablet, Rfl: 4  ???  mirtazapine (REMERON) 7.5 MG tablet, Take 1 tablet (7.5 mg total) by mouth nightly., Disp: 90 tablet, Rfl: 0  ???  ondansetron (ZOFRAN) 8 MG tablet, Take 1 tablet by mouth Take as directed., Disp: , Rfl:   ???  sulfaSALAzine (AZULFIDINE) 500 mg tablet, Take 2 tablets (1000mg ) by mouth twice daily for 7 days, then take 4 tablets (2000mg ) twice daily thereafter., Disp: 240 tablet, Rfl: 11      Allergies   Allergen Reactions   ??? Compazine [Prochlorperazine] Itching   ??? Coconut Nausea And Vomiting   ??? Multihance [Gadobenate Dimeglumine] Other (See Comments)     Patient sneezed immediately after administration of contrast.       ROS  No chest pain or SOB. No f/c.  No cough.  No fractures or falls. Remainder of 8 systems reviewed were negative except as noted in the history of present illness.    Social Hx:  -Living in [place].  -Working at [employer], a Quarry manager company, changed to desk job in 07-29-20  -Nonsmoker    Family History   Problem Relation Age of Onset   ??? Hyperthyroidism Mother    ??? Osteoporosis Mother    ??? Arrhythmia Mother    ??? Squamous cell carcinoma Mother         basal cell vs squamous cell skin cancer   ??? Coronary artery disease Father         s/p CABG   ??? Diabetes Father    ??? Hypertension Father    ??? Prostate cancer Father    ??? Colon cancer Paternal Grandmother    ??? Thyroid disease Neg Hx    Father died 2017-02-28 with diabetes and heart disease  Mother died in 2012-07-29    Objective:     Physical Exam:  There were no vitals taken for this visit.   BP Readings from Last 3 Encounters:   06/21/20 132/90   05/10/20 130/99   04/19/20 129/93     General appearance - Pleasant, conversant, NAD. Generalized and abdominal obesity.   Eyes -  No lid lag or stare, no lid edema  Neck - Supple w/o palpable LAD, no masses appreciated. Well healed incision from thyroidectomy.and previous LND.  Lymphatics - no cervical or supraclavicular adenopathy appreciated   Resp - clear to auscultation bilaterally  CV - normal rate, regular rhythm  Neurological - no hand tremors, 2+ upper extremity DTRs  Extremities - peripheral pulses normal, no lower extremity edema  Skin - warm, dry, no acanthosis.    Data Review:    TSH   Date Value   05/10/2020 9.387 uIU/mL (H)   01/03/2020 26.788 uIU/mL (H)   08/30/2019 1.748 uIU/mL   10/04/2013 0.11 u[iU]/mL (L)   06/28/2013 0.28 u[iU]/mL (L)   04/27/2013 0.09 u[iU]/mL (L)     Free T4 (ng/dL)   Date Value 16/10/9602 0.94   01/03/2020 0.88 (L)   08/30/2019 1.13   10/04/2013 1.55 (H)   06/28/2013 1.40   04/27/2013 1.69 (H)     THYROGLOBULIN AB (IU/mL)   Date Value   08/30/2019 <1.8   06/22/2018 <1.8   12/17/2017 <1.8     Thyroglobulin, Tumor Marker, IA (ng/mL)   Date Value   08/30/2019 0.3 (H)   06/22/2018 0.2 (H)   12/17/2017 0.3 (H)      Lab Results   Component Value Date    CALCIUM 8.2 (L) 06/21/2020    CALCIUM 7.6 (L) 05/10/2020    CALCIUM 8.4 (L) 03/01/2020    PHOS 5.8 (H) 05/10/2020    PHOS 4.9 02/01/2020    PHOS 5.2 (H) 01/31/2020    CREATININE 0.97 06/21/2020    CREATININE 0.93 05/10/2020    CREATININE 0.96 03/01/2020    VITDTOTAL 31.0 02/27/2017    VITDTOTAL 37 12/04/2015    VITDTOTAL 34 12/12/2014    PTH 9.9 (L) 12/04/2015    PTH 12.4 12/12/2014    PTH 12 04/27/2013     Lab Results   Component Value Date    A1C 5.4 05/10/2020     Calcium values while hospitalized in 06/2016 were low at less than 7, and had low ionized calcium as well.     01/2016 Thyrogen stimulated testing  - Tg rose to 2.4    PATH from 03/2013 right neck dissection:  Amended: 03/31/2013 by Kirkland Hun, MD  Reason: Additional Information  Comment: The report is amended to add the results of a BRAF V600E immunostain.  The final diagnosis is unchanged.  Previous Signout Date: 03/30/2013  Diagnosis:  Lymph nodes, right cervical, levels 2-4, removal and partial parotidectomy  -Metastatic melanoma involving 1 out of 20 lymph nodes, with the largest diameter measuring 10 mm (1.0 cm) and no evidence of extracapsular extension (1/20)  -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, with the largest diameter measuring 3.5 mm and no evidence of extracapsular extension (3/20)   -BRAF V600E immunohistochemical staining is positive (3+ staining, 100% of cells) --> this was on LN with melanoma  -Benign parotid gland is also present  *Molecular testing:  RESULTS:  Gene Variants of Known Clinical Utility:   BRAF: No mutation detected (see note) --> A BRAF V600E immunohistochemical stain was performed on the tissue section of this case and was positive (see case [id number]). Though within validated parameters, the tumor input for sequencing in this case was low. This could result in the V600E mutant allele frequency falling below the limit of detection for the assay. [Again on LN with melanoma]  KIT: No mutation detected     Radiology:      Results for orders placed during the hospital encounter of 10/28/18   US Soft Tissue Head And Neck  Narrative EXAM: US SOFT TISSUE HEAD AND NECK  DATE: 10/28/2018 9:20 AM  ACCESSION: [id number]  DICTATED: 10/28/2018 10:48 AM  INTERPRETATION LOCATION: [facility]    CLINICAL INDICATION: 45 years old Male with thyroid cancer  - C73 - Thyroid cancer (CMS - HCC)     COMPARISON: Thyroid ultrasound from 12/17/2017 and earlier examinations    TECHNIQUE:  Ultrasound views of the thyroid were obtained using gray scale and limited color Doppler imaging.    FINDINGS:  Status post total thyroidectomy. Redemonstration of heterogeneous hypoechoic nodule in the midline, superior to the thyroidectomy bed measuring 1.1 x 1 x 0.5 cm, previously 1.1 x 1.0 x 0.6 cm. No new soft tissues are noted in the thyroidectomy bed.    Lymph nodes: No adenopathy      Impression Status post total thyroidectomy. Unchanged 1.1 cm heterogeneous nodule in the midline superior to the thyroidectomy bed. Appearance is similar since 02/02/2015, likely nonaggressive process. Subcentimeter cervical chain lymph nodes measuring up to 0.7 cm and left level 2, previously 0.4 cm.    Please see below for data measurements:    Lymph node measurements:  Right I 0.48 cm  Right II 0.18 cm  Right V 0.14 cm    Left I 0.49 cm  Left II 0.68 cm  Left III 0.36 cm  Left IV 0.48 cm  Left V 0.29 cm   [Images reviewed 10/2018]    01/2016 Whole body scan:  FINDINGS: ??There are no abnormal foci of increased radiotracer uptake identified. ??There is physiologic uptake seen in the nasopharynx, the bowel, stomach, and bladder.  Impression  No evidence of I-131 avid metastasis.    05/2013 had stimulated PET and WBS, as well as 1 week post treatment WBS     Other Medical Data:

## 2020-08-21 MED FILL — MEKTOVI 15 MG TABLET: ORAL | 30 days supply | Qty: 180 | Fill #10

## 2020-08-21 MED FILL — SULFASALAZINE 500 MG TABLET: 30 days supply | Qty: 240 | Fill #7

## 2020-08-21 MED FILL — BRAFTOVI 75 MG CAPSULE: ORAL | 30 days supply | Qty: 180 | Fill #10

## 2020-08-28 ENCOUNTER — Encounter: Admit: 2020-08-28 | Discharge: 2020-08-29 | Payer: PRIVATE HEALTH INSURANCE | Attending: Clinical | Primary: Clinical

## 2020-08-28 NOTE — Unmapped (Signed)
[facility]  Psychiatry  Psychotherapy Note - Telehealth via??Video  ??  Service Date:??August 15,??2022  Service:??50??minutes of??psychotherapy via??video??session  Time with Patient:??50??minutes  ??  Encounter Description:??This encounter was conducted from provider's office via EPIC video session due to COVID-19 pandemic. Kenneth Fitzgerald was located in his home. Part of today's session was conducted via Kenneth Fitzgerald. Rationale for??video session??is need to social distance. See Plan for telemedicine consent/disclaimer.   ??  Encounter Description/Consent:??  Kenneth Fitzgerald visit was completed through telehealth encounter (video).   ??  This patient encounter is appropriate and reasonable under the circumstances. The patient has been advised of the potential risks and limitations of this mode of treatment (including, but not limited to, the absence of in-person examination) and has agreed to be treated in a remote fashion in spite of them. Any and all of the patient's/patient's family's questions on this issue have been answered.   ??  The patient was physically located in [place] in which I am permitted to provide care. The patient??understood that??he may incur co-pays and cost sharing, and agreed to the telemedicine visit. The visit was??reasonable and appropriate under the circumstances given the patient's presentation at the time.   ??  Time Spent: 50??minutes  ??  Assessment:  Kenneth Fitzgerald a 45yo??male??with metastatic melanoma, h/o thyroid cancer, recent portal vein thrombosis, who I see in therapy to address depression and enhance coping with??multiple??stressors.????We met today via telehealth (video).  Kenneth Fitzgerald expressed anxiety and poor mood related to legal issues, which he thought were settled, that have resurfaced anew since our last session.  He was understandably upset and has been perseverating on the potential ramifications of this latest news.  I offered psychotherapy, support,??and recommendations to coping. ??Will see next??in two weeks.  ??  Risk Assessment:  A suicide and violence risk assessment was performed as part of this evaluation. There patient is deemed to be at chronic elevated risk??for self-harm/suicide given the following factors: current diagnosis of depression, suicidal ideation or threats without a plan and past diagnosis of depression. There patient is deemed to be at chronic elevated risk??for violence given the following factors: male gender. These risk factors are mitigated by the following factors: lack of active SI/HI. There is no acute risk for suicide or violence at this time. The patient was educated about relevant modifiable risk factors including following recommendations for treatment of psychiatric illness and abstaining from substance abuse.??While future psychiatric events cannot be accurately predicted, the patient does not currently require acute inpatient psychiatric care and does not currently meet [facility] involuntary commitment criteria.??????????????????  ??  Plan:  1.??I will continue to see pt for psychotherapy. ??Next session??is??scheduled??to take place on 8/29.  2. Pt??has??information on available [facility] resources.??Pt is working with [name], [name]. ??I have??previously??reached out to [name] to explore financial resources.  3.??Pt??has been followed by Dr. Lawernce Keas of Psychiatry.  4. Pt has??my contact information and knows??to reach me as needed.  ??  Subjective:??  Kenneth Fitzgerald??presented as alert and oriented.????Affect was more flat than usual, consistent with the content of today's session.    Kenneth Fitzgerald reported that there have been new developments in his legal situation since the time of our last session.  He had previously been cautiously optimistic that the legal case was nearing completion, only to learn last week that it has been extended.  He feeling gutted by the development, and has experienced understandable low mood, poorer sleep, and more anxiety. He described perseverating on the  potential ramifications this could have on his financial and social standings, and in general expressed how he feels he is right back where I was when this all started. He denied any thoughts of self-harm or feeling hopeless.    I provided Kenneth Fitzgerald support and understanding, and offered a forum to process his thoughts and feelings.  We problem-solved steps that he can take - and just as importantly, ways to tolerate distressing aspects of a situation that he has very little power to change at this time.  Kenneth Fitzgerald was receptive.  We will meet in two weeks.    Kenneth Fitzgerald??continues to take??Cymbalta??and Mirtazapine??as prescribed.    ??  Mental Status Exam:  Appearance:  ?? ??Appears stated age   Motor: ?? ??No abnormal movements   Speech/Language:  ?? ??Normal rate, volume, tone, fluency   Mood: ?? ??not so good   Affect: ?? ??engaged; more flat than usual   Thought process: ?? ??Logical, linear, clear, coherent, goal-directed   Thought content: ?? ?? ??Denies SI/HI or thought of self-harm   Perceptual disturbances: ?? ?? ??Behavior not concerning for response to internal stimuli  ??   Orientation: ?? ??Oriented to person, place, time, and general circumstances   Attention: ?? ??Able to fully attend without fluctuations in consciousness   Concentration: ?? ??Able to fully concentrate and attend   Memory: ?? ??Immediate, short-term, long-term, and recall grossly intact   ??Fund of knowledge:  ?? ??Consistent with level of education and development   Insight: ?? ?? ??Intact   Judgment:  ?? ??Intact   Impulse Control: ?? ??Intact   ??  Diagnosis: Recurrent major depressive disorder in partial remission; ??Anxiety associated with cancer diagnosis  ??  [name]. Keyli Duross PhD  August 16,??2022

## 2020-09-11 ENCOUNTER — Telehealth: Admit: 2020-09-11 | Discharge: 2020-09-12 | Payer: PRIVATE HEALTH INSURANCE | Attending: Clinical | Primary: Clinical

## 2020-09-11 DIAGNOSIS — F411 Generalized anxiety disorder: Principal | ICD-10-CM

## 2020-09-11 DIAGNOSIS — C801 Malignant (primary) neoplasm, unspecified: Principal | ICD-10-CM

## 2020-09-11 DIAGNOSIS — F3341 Major depressive disorder, recurrent, in partial remission: Principal | ICD-10-CM

## 2020-09-11 NOTE — Unmapped (Signed)
[facility]  Psychiatry  Psychotherapy Note - Telehealth via??Video  ??  Service Date:??August 29,??2022  Service:??50??minutes of??psychotherapy via??video??session  Time with Patient:??50??minutes  ??  Encounter Description:??This encounter was conducted from provider's office via EPIC video session due to COVID-19 pandemic. Kenneth Fitzgerald was located in his home. Due to connectivity issues, nearly all of today's session was conducted via Doximity. Rationale for??video session??is need to social distance. See Plan for telemedicine consent/disclaimer.   ??  Encounter Description/Consent:??  [name] visit was completed through telehealth encounter (video).   ??  This patient encounter is appropriate and reasonable under the circumstances. The patient has been advised of the potential risks and limitations of this mode of treatment (including, but not limited to, the absence of in-person examination) and has agreed to be treated in a remote fashion in spite of them. Any and all of the patient's/patient's family's questions on this issue have been answered.   ??  The patient was physically located in [place] in which I am permitted to provide care. The patient??understood that??he may incur co-pays and cost sharing, and agreed to the telemedicine visit. The visit was??reasonable and appropriate under the circumstances given the patient's presentation at the time.   ??  Time Spent: 50??minutes  ??  Assessment:  Kenneth Fitzgerald a 45yo??male??with metastatic melanoma, h/o thyroid cancer, recent portal vein thrombosis, who I see in therapy to address depression and enhance coping with??multiple??stressors.????We met today via telehealth (video).  Kenneth Fitzgerald continues to experience anxiety and poor mood related to new legal issues.  He has been perseverating on the potential ramifications of this latest news, but also taking steps to stay busy and distract himself..  I offered psychotherapy, support,??and recommendations for tolerating and coping with distress. ??Will see next??in two weeks.  ??  Risk Assessment:  A suicide and violence risk assessment was performed as part of this evaluation. There patient is deemed to be at chronic elevated risk??for self-harm/suicide given the following factors: current diagnosis of depression, suicidal ideation or threats without a plan and past diagnosis of depression. There patient is deemed to be at chronic elevated risk??for violence given the following factors: male gender. These risk factors are mitigated by the following factors: lack of active SI/HI. There is no acute risk for suicide or violence at this time. The patient was educated about relevant modifiable risk factors including following recommendations for treatment of psychiatric illness and abstaining from substance abuse.??While future psychiatric events cannot be accurately predicted, the patient does not currently require acute inpatient psychiatric care and does not currently meet [facility] involuntary commitment criteria.??????????????????  ??  Plan:  1.??I will continue to see pt for psychotherapy. ??Next session??is??scheduled??to take place on 9/13.  2. Pt??has??information on available [facility] resources.??Pt is working with [name], [name]. ??I have??previously??reached out to [name] to explore financial resources.  3.??Pt??has been followed by Dr. Lawernce Keas of Psychiatry.  4. Pt has??my contact information and knows??to reach me as needed.  ??  Subjective:??  Kenneth Fitzgerald??presented as alert and oriented. He affect was appropriate and he was engaged during today's session.    Kenneth Fitzgerald reported that he has remained upset about the still relatively new developments in his legal situation.  He noted some poorer mood and sleep, but primarily he has been struggling with perseverating on the financial, social and self-esteem ramifications of his legal situation. He noted anxiety about how others will view him and that he may be ostracized.  Fortunately, he  has felt supported by the few people that he has confided in over the last several weeks.  He denied any thoughts of self-harm or feeling hopeless.    I provided Kenneth Fitzgerald support and understanding, and offered a forum to process his thoughts and feelings.  Given that he does not share with many others, my sense is that the therapeutic arena is especially important for him at this time.  We reviewed and problem-solved steps that he can take - and just as importantly, ways to tolerate distressing aspects of a situation that he has very little power to change at this time.  He had some success distracting himself this weekend and staying busy with work. Kenneth Fitzgerald was engaged during our session.  We will meet in two weeks.    Kenneth Fitzgerald??continues to take??Cymbalta??and Mirtazapine??as prescribed.  ??  Mental Status Exam:  Appearance:  ?? ??Appears stated age   Motor: ?? ??No abnormal movements   Speech/Language:  ?? ??Normal rate, volume, tone, fluency   Mood: ?? ??its on my mind a lot   Affect: ?? ??engaged   Thought process: ?? ??Logical, linear, clear, coherent, goal-directed   Thought content: ?? ?? ??Denies SI/HI or thought of self-harm   Perceptual disturbances: ?? ?? ??Behavior not concerning for response to internal stimuli  ??   Orientation: ?? ??Oriented to person, place, time, and general circumstances   Attention: ?? ??Able to fully attend without fluctuations in consciousness   Concentration: ?? ??Able to fully concentrate and attend   Memory: ?? ??Immediate, short-term, long-term, and recall grossly intact   ??Fund of knowledge:  ?? ??Consistent with level of education and development   Insight: ?? ?? ??Intact   Judgment:  ?? ??Intact   Impulse Control: ?? ??Intact   ??  Diagnosis: Recurrent major depressive disorder in partial remission; ??Anxiety associated with cancer diagnosis  ??  [name]. Venida Tsukamoto PhD  August 29,??2022

## 2020-09-12 NOTE — Unmapped (Signed)
Kenneth Fitzgerald is a 45 y.o. male who presents for the evaluation of left ear fullness and diminished hearing. Notably, he was diagnosed with metastatic melanoma in 2015 with metastases to brain and abdominal/pelvic lymph nodes. He recently started the process of palliative care. Today, the patient reports that fullness/ pressure started after completing radiation 8 months ago. He underwent myringotomy with Dr. Ralene Ok on 06/08/2020 with improvements in hearing and discomfort, but fullness returned after a few days. Denies otorrhea following myringotomy. The patient also reports significant sinus pressure and nasal drainage. He is currently using Flonase BID and taking Allegra daily, but symptoms continue to persist.     Past Medical History  He  has a past medical history of Cancer (CMS-HCC), Disease of thyroid gland, HL (hearing loss), Hypothyroidism, and Skin cancer.    Past Surgical History  His  has a past surgical history that includes pr removal nodes, neck,cerv cmplt (Left, 12/07/2012); pr removal nodes, neck,cerv mod rad (Right, 03/29/2013); pr lap, ventral hernia repair,reducible (N/A, 04/07/2014); pr repair recurr incis hernia,reduc (N/A, 01/03/2015); pr implant mesh hernia repair/debridement closure (N/A, 01/03/2015); pr repair recurr incis hernia,strang (N/A, 06/21/2016); pr colonoscopy w/biopsy single/multiple (N/A, 01/09/2018); pr sigmoidoscopy,fine needl bx,us guided (N/A, 01/09/2018); pr colonoscopy w/biopsy single/multiple (N/A, 08/24/2019); pr colsc flx with directed submucosal njx any sbst (N/A, 08/24/2019); pr exploratory of abdomen (N/A, 01/24/2020); pr freeing bowel adhesion,enterolysis (N/A, 01/24/2020); pr repair recurr incis hernia,strang (N/A, 01/24/2020); and pr negative pressure wound therapy dme >50 sq cm (N/A, 01/24/2020).    Past Family History  His family history includes Arrhythmia in his mother; Colon cancer in his paternal grandmother; Coronary artery disease in his father; Diabetes in his father; Hypertension in his father; Hyperthyroidism in his mother; Osteoporosis in his mother; Prostate cancer in his father; Squamous cell carcinoma in his mother.    Past Social History  He  reports that he has never smoked. He has never used smokeless tobacco. He reports current alcohol use. He reports that he does not use drugs.    Medications/Allergies  His current medication(s) include:    Current Outpatient Medications:   ???  acetaminophen (TYLENOL) 500 MG tablet, Take 2 tablets (1,000 mg total) by mouth every eight (8) hours as needed for pain., Disp: 30 tablet, Rfl: 0  ???  binimetinib ([name]) 15 mg tablet, Take 3 tablets (45 mg total) by mouth Two (2) times a day., Disp: 180 tablet, Rfl: 11  ???  blood sugar diagnostic Strp, Dispense 100 blood glucose test strips, ok to sub any brand preferred by insurance/patient, use 3x/day; dispense whatever brand matches with meter., Disp: 100 strip, Rfl: 12  ???  blood-glucose meter kit, Use as instructed; dispense 1 meter, whatever is preferred by insurance, Disp: 1 each, Rfl: 1  ???  calcitrioL (ROCALTROL) 0.25 MCG capsule, Take 1 capsule (0.25 mcg total) by mouth daily., Disp: 90 capsule, Rfl: 3  ???  calcium carbonate 650 mg calcium (1,625 mg) tablet, Take 1 tablet by mouth Three (3) times a day with a meal., Disp: , Rfl:   ???  DULoxetine (CYMBALTA) 30 MG capsule, TAKE 1 CAPSULE (30 MG TOTAL) BY MOUTH TWO (2) TIMES A DAY., Disp: 180 capsule, Rfl: 2  ???  encorafenib (BRAFTOVI) 75 mg capsule, Take 6 capsules (450 mg total) by mouth daily., Disp: 180 capsule, Rfl: 11  ???  fexofenadine (ALLEGRA ALLERGY) 180 MG tablet, Take 180 mg by mouth daily as needed (allergies). , Disp: , Rfl:   ???  fluticasone propionate (FLONASE)  50 mcg/actuation nasal spray, 2 sprays into each nostril two (2) times a day., Disp: 16 g, Rfl: 6  ???  folic acid (FOLVITE) 1 MG tablet, Take 1 tablet (1 mg total) by mouth daily., Disp: 30 tablet, Rfl: 11  ???  ibuprofen (ADVIL,MOTRIN) 200 MG tablet, Take 600 mg by mouth as needed in the morning for pain., Disp: , Rfl:   ???  lancets Misc, Dispense 100 lancets, ok to sub any brand preferred by insurance/patient, use 3x/day, Disp: 100 each, Rfl: 12  ???  levothyroxine (SYNTHROID) 125 MCG tablet, Take 2 tablets (250 mcg total) every Sunday (ie 1 day per week). Take 3 tablets (375 mcg total) on Monday-Saturday (ie 6 days per week)., Disp: 270 tablet, Rfl: 3  ???  memantine (NAMENDA) 10 MG tablet, Take 10 mg by mouth daily., Disp: , Rfl:   ???  memantine (NAMENDA) 10 MG tablet, TAKE 1 TABLET BY MOUTH TWO TIMES A DAY., Disp: 180 tablet, Rfl: 4  ???  mirtazapine (REMERON) 7.5 MG tablet, Take 1 tablet (7.5 mg total) by mouth nightly., Disp: 90 tablet, Rfl: 0  ???  ondansetron (ZOFRAN) 8 MG tablet, Take 1 tablet by mouth Take as directed., Disp: , Rfl:   ???  sulfaSALAzine (AZULFIDINE) 500 mg tablet, Take 2 tablets (1000mg ) by mouth twice daily for 7 days, then take 4 tablets (2000mg ) twice daily thereafter., Disp: 240 tablet, Rfl: 11  Allergies: Compazine [prochlorperazine], Coconut, and Multihance [gadobenate dimeglumine],    Review of systems   Review of systems was reviewed on attached notes/patient intake forms.      Physical Examination  BP 130/91  - Pulse 75  - Temp 36.9 ??C (98.4 ??F)  - Resp 20  - Ht 188 cm (6' 2)  - Wt (!) 137.4 kg (303 lb)  - BMI 38.90 kg/m??     General: well appearing, stated age, no distress   Head - atraumatic, normocephalic   Nose: dorsum midline, no rhinorrhea  Neck: symmetric, trachea midline  Psychiatric: alert and oriented, appropriate mood and affect   Respiratory: no audible wheezing or stridor, normal work of breathing  Neurologic - cranial nerves 2-12 grossly intact  Facial Strength - HB 1/6 bilaterally    Ears - External ear- normal, no lesions, no malformations   Otoscopy - R: normal EAC, intact TM, clear middle ear space. L: normal EAC, intact TM, effusion in middle ear space.     Procedures  Procedure: myringotomy left  Preop Dx: otitis media with effusion  Anesthesia: none  Description: After obtaining consent, phenol was applied to the anterior inferior aspect of the eardrum.  A myringotomy was made in that position.  The serous fluid was suctioned free with immediate improvement in hearing.  The patient tolerated the procedure well.  There were no complications.    Audiogram  The audiogram from 06/08/2020 was reviewed. This demonstrates normal right sided hearing and left sided normal sloping to moderate conductive hearing loss.       Tympanogram  Type A on the right; Type B on the left.     Imaging  None.    I reviewed medical records scanned into media, pertinent findings are summarized in the HPI.    Assessment and Plan  The patient is a 45 y.o. male with left ear fullness and diminished hearing in the setting of previous radiation. On exam, there is effusion in the left middle ear space. Discussed treatment options including tympanostomy versus myringotomy. The patient experienced benefit from  previous myringotomy and opted to try again before proceeding with PE tube placement. I think this is reasonable and myringotomy was performed in clinic today without issue. Prescribed ear drops in the meantime. He will follow up with me in 6-8 weeks for reevaluation. If middle ear effusion continues to persist at next visit, we will proceed with PE tube.    The patient/family voiced understanding of the plan as detailed above and is in agreement. I appreciate the opportunity to participate in his care.    I attest to the above information and documentation. However, this note has been created using voice recognition software and may have errors that were not dictated and not seen in editing.    Cheryl Flash MD  Assistant Professor   Division of Otology/Neurotology  Department of [facility], [place] [place]    Scribe's Attestation: Norvel Richards. Kendel Bessey, MD obtained and performed the history, physical exam and medical decision making elements that were entered into the chart. Signed by Rozann Lesches, Scribe, on September 13, 2020 at 9:25 AM.    ----------------------------------------------------------------------------------------------------------------------  September 15, 2020 6:06 PM. Documentation assistance provided by the Scribe. I was present during the time the encounter was recorded. The information recorded by the Scribe was done at my direction and has been reviewed and validated by me.  ----------------------------------------------------------------------------------------------------------------------'

## 2020-09-13 ENCOUNTER — Ambulatory Visit
Admit: 2020-09-13 | Discharge: 2020-09-13 | Payer: PRIVATE HEALTH INSURANCE | Attending: Otolaryngology | Primary: Otolaryngology

## 2020-09-13 ENCOUNTER — Other Ambulatory Visit: Admit: 2020-09-13 | Discharge: 2020-09-13 | Payer: PRIVATE HEALTH INSURANCE

## 2020-09-13 DIAGNOSIS — C439 Malignant melanoma of skin, unspecified: Principal | ICD-10-CM

## 2020-09-13 DIAGNOSIS — H9210 Otorrhea, unspecified ear: Principal | ICD-10-CM

## 2020-09-13 DIAGNOSIS — E890 Postprocedural hypothyroidism: Principal | ICD-10-CM

## 2020-09-13 LAB — COMPREHENSIVE METABOLIC PANEL
ALBUMIN: 3.7 g/dL (ref 3.4–5.0)
ALKALINE PHOSPHATASE: 65 U/L (ref 46–116)
ALT (SGPT): 27 U/L (ref 10–49)
ANION GAP: 8 mmol/L (ref 5–14)
AST (SGOT): 29 U/L (ref <=34)
BILIRUBIN TOTAL: 0.2 mg/dL — ABNORMAL LOW (ref 0.3–1.2)
BLOOD UREA NITROGEN: 18 mg/dL (ref 9–23)
BUN / CREAT RATIO: 20
CALCIUM: 8.3 mg/dL — ABNORMAL LOW (ref 8.7–10.4)
CHLORIDE: 107 mmol/L (ref 98–107)
CO2: 23 mmol/L (ref 20.0–31.0)
CREATININE: 0.89 mg/dL
EGFR CKD-EPI (2021) MALE: >90 mL/min/{1.73_m2} (ref >=60)
GLUCOSE RANDOM: 112 mg/dL (ref 70–179)
POTASSIUM: 4 mmol/L (ref 3.4–4.8)
PROTEIN TOTAL: 6.8 g/dL (ref 5.7–8.2)
SODIUM: 138 mmol/L (ref 135–145)

## 2020-09-13 LAB — CBC W/ AUTO DIFF
BASOPHILS ABSOLUTE COUNT: 0 10*9/L (ref 0.0–0.1)
BASOPHILS RELATIVE PERCENT: 0.2 %
EOSINOPHILS ABSOLUTE COUNT: 0.2 10*9/L (ref 0.0–0.5)
EOSINOPHILS RELATIVE PERCENT: 3.3 %
HEMATOCRIT: 37.5 % — ABNORMAL LOW (ref 39.0–48.0)
HEMOGLOBIN: 12.2 g/dL — ABNORMAL LOW (ref 12.9–16.5)
LYMPHOCYTES ABSOLUTE COUNT: 1.1 10*9/L (ref 1.1–3.6)
LYMPHOCYTES RELATIVE PERCENT: 19.3 %
MEAN CORPUSCULAR HEMOGLOBIN CONC: 32.5 g/dL (ref 32.0–36.0)
MEAN CORPUSCULAR HEMOGLOBIN: 25.4 pg — ABNORMAL LOW (ref 25.9–32.4)
MEAN CORPUSCULAR VOLUME: 78 fL (ref 77.6–95.7)
MEAN PLATELET VOLUME: 8.6 fL (ref 6.8–10.7)
MONOCYTES ABSOLUTE COUNT: 0.4 10*9/L (ref 0.3–0.8)
MONOCYTES RELATIVE PERCENT: 7 %
NEUTROPHILS ABSOLUTE COUNT: 4 10*9/L (ref 1.8–7.8)
NEUTROPHILS RELATIVE PERCENT: 70.2 %
PLATELET COUNT: 218 10*9/L (ref 150–450)
RED BLOOD CELL COUNT: 4.81 10*12/L (ref 4.26–5.60)
RED CELL DISTRIBUTION WIDTH: 17.2 % — ABNORMAL HIGH (ref 12.2–15.2)
WBC ADJUSTED: 5.7 10*9/L (ref 3.6–11.2)

## 2020-09-13 LAB — LACTATE DEHYDROGENASE: LACTATE DEHYDROGENASE: 203 U/L (ref 120–246)

## 2020-09-13 LAB — T4, FREE: FREE T4: 0.79 ng/dL — ABNORMAL LOW (ref 0.89–1.76)

## 2020-09-13 LAB — TSH: THYROID STIMULATING HORMONE: 7.389 u[IU]/mL — ABNORMAL HIGH (ref 0.550–4.780)

## 2020-09-13 MED ORDER — OFLOXACIN 0.3 % EYE DROPS
3 refills | 0 days | Status: CP
Start: 2020-09-13 — End: ?

## 2020-09-13 NOTE — Unmapped (Signed)
[facility] Clinical Assessment & Refill Coordination Note    Kenneth Fitzgerald [facility], DOB: Apr 21, 1975  Phone: There are no phone numbers on file.    All above HIPAA information was verified with patient.     Was a Nurse, learning disability used for this call? No    Specialty Medication(s):   Hematology/Oncology: Braftovi     Current Outpatient Medications   Medication Sig Dispense Refill   ??? acetaminophen (TYLENOL) 500 MG tablet Take 2 tablets (1,000 mg total) by mouth every eight (8) hours as needed for pain. 30 tablet 0   ??? binimetinib ([name]) 15 mg tablet Take 3 tablets (45 mg total) by mouth Two (2) times a day. 180 tablet 11   ??? blood sugar diagnostic Strp Dispense 100 blood glucose test strips, ok to sub any brand preferred by insurance/patient, use 3x/day; dispense whatever brand matches with meter. 100 strip 12   ??? blood-glucose meter kit Use as instructed; dispense 1 meter, whatever is preferred by insurance 1 each 1   ??? calcitrioL (ROCALTROL) 0.25 MCG capsule Take 1 capsule (0.25 mcg total) by mouth daily. 90 capsule 3   ??? calcium carbonate 650 mg calcium (1,625 mg) tablet Take 1 tablet by mouth Three (3) times a day with a meal.     ??? DULoxetine (CYMBALTA) 30 MG capsule TAKE 1 CAPSULE (30 MG TOTAL) BY MOUTH TWO (2) TIMES A DAY. 180 capsule 2   ??? encorafenib (BRAFTOVI) 75 mg capsule Take 6 capsules (450 mg total) by mouth daily. 180 capsule 11   ??? fexofenadine (ALLEGRA ALLERGY) 180 MG tablet Take 180 mg by mouth daily as needed (allergies).      ??? fluticasone propionate (FLONASE) 50 mcg/actuation nasal spray 2 sprays into each nostril two (2) times a day. 16 g 6   ??? folic acid (FOLVITE) 1 MG tablet Take 1 tablet (1 mg total) by mouth daily. 30 tablet 11   ??? ibuprofen (ADVIL,MOTRIN) 200 MG tablet Take 600 mg by mouth as needed in the morning for pain.     ??? lancets Misc Dispense 100 lancets, ok to sub any brand preferred by insurance/patient, use 3x/day 100 each 12   ??? levothyroxine (SYNTHROID) 125 MCG tablet Take 2 tablets (250 mcg total) every Sunday (ie 1 day per week). Take 3 tablets (375 mcg total) on Monday-Saturday (ie 6 days per week). 270 tablet 3   ??? memantine (NAMENDA) 10 MG tablet Take 10 mg by mouth daily.     ??? memantine (NAMENDA) 10 MG tablet TAKE 1 TABLET BY MOUTH TWO TIMES A DAY. 180 tablet 4   ??? mirtazapine (REMERON) 7.5 MG tablet Take 1 tablet (7.5 mg total) by mouth nightly. 90 tablet 0   ??? ofloxacin (OCUFLOX) 0.3 % ophthalmic solution Instill 4 drops in left ear twice daily for 5 days 5 mL 3   ??? ondansetron (ZOFRAN) 8 MG tablet Take 1 tablet by mouth Take as directed.     ??? sulfaSALAzine (AZULFIDINE) 500 mg tablet Take 2 tablets (1000mg ) by mouth twice daily for 7 days, then take 4 tablets (2000mg ) twice daily thereafter. 240 tablet 11     No current facility-administered medications for this visit.        Changes to medications: Kenneth Fitzgerald reports no changes at this time.    Allergies   Allergen Reactions   ??? Compazine [Prochlorperazine] Itching   ??? Coconut Nausea And Vomiting   ??? Multihance [Gadobenate Dimeglumine] Other (See Comments)  Patient sneezed immediately after administration of contrast.       Changes to allergies: No    SPECIALTY MEDICATION ADHERENCE     Braftovi 75 mg: 14 days of medicine on hand     Medication Adherence    Patient reported X missed doses in the last month: 0  Specialty Medication: Braftovi 75mg           Specialty medication(s) dose(s) confirmed: Regimen is correct and unchanged.     Are there any concerns with adherence? No    Adherence counseling provided? Not needed    CLINICAL MANAGEMENT AND INTERVENTION      Clinical Benefit Assessment:    Do you feel the medicine is effective or helping your condition? Yes    Clinical Benefit counseling provided? Not needed    Adverse Effects Assessment:    Are you experiencing any side effects? No    Are you experiencing difficulty administering your medicine? No    Quality of Life Assessment:    Quality of Life      Oncology  1. What impact has your specialty medication had on the reduction of your daily pain or discomfort level?: None  2. On a scale of 1-10, how would you rate your ability to manage side effects associated with your specialty medication? (1=no issues, 10 = unable to take medication due to side effects): 1          How many days over the past month did your condition/medication  keep you from your normal activities? For example, brushing your teeth or getting up in the morning. 0    Have you discussed this with your provider? Not needed    Acute Infection Status:    Acute infections noted within Epic:  No active infections  Patient reported infection: None    Therapy Appropriateness:    Is therapy appropriate? Yes, therapy is appropriate and should be continued    DISEASE/MEDICATION-SPECIFIC INFORMATION      N/A    PATIENT SPECIFIC NEEDS     - Does the patient have any physical, cognitive, or cultural barriers? No    - Is the patient high risk? Yes, patient is taking oral chemotherapy. Appropriateness of therapy as been assessed    - Does the patient require a Care Management Plan? No     - Does the patient require physician intervention or other additional services (i.e. nutrition, smoking cessation, social work)? No      SHIPPING     Specialty Medication(s) to be Shipped:   Hematology/Oncology: Braftovi    Other medication(s) to be shipped: sulfasalazine     Changes to insurance: No    Delivery Scheduled: Yes, Expected medication delivery date: 09/20/20.     Medication will be delivered via UPS to the confirmed prescription address in [facility]    The patient will receive a drug information handout for each medication shipped and additional FDA Medication Guides as required.  Verified that patient has previously received a Conservation officer, historic buildings and a Surveyor, mining.    The patient or caregiver noted above participated in the development of this care plan and knows that they can request review of or adjustments to the care plan at any time.      All of the patient's questions and concerns have been addressed.    Rollen Sox   [facility] Specialty Pharmacist

## 2020-09-13 NOTE — Unmapped (Signed)
Dr. Albertine Patricia Instructions after Myringotomy (Incision in the Ear Drum)      Myringotomy with or without placement of ear tubes is the most common ear operation. Myringotomy is an incision in the eardrum about 1/16th to 1/8th inch long. Middle ear fluid and infected material can be suctioned through this opening.        Ear Care After Myringotomy  - You will be prescribed an antibiotic ear drop. This will help decrease drainage from the ear.   - Use Tylenol for mild pain if needed.   - It is normal to have some popping noise in the ear for a few hours.   - Having slight blood tinged drainage is common for a few days after myringotomy.   - You may hear air going out through your ear - particularly when you blow your noise.   - It usually takes several days to weeks for the myringotomy incision to heal.   - It is ok to get water into the ear, but may cause pain if water gets into the middle ear space.     Who can I contact with questions or concerns?   You saw Eleonore Chiquito, MD for your visit today    For appointments call [phone]    To speak to a nurse call Casimiro Needle  Phone: [phone]    To schedule surgery contact Lillette Boxer:  Phone: [phone]     After hours, please call [phone] and ask for the ENT physician on call.    Main phone tree:  [phone]  Triage M-F: 8:15-4:00pm [phone]  Office fax #:  [phone]    MyChart is an electronic way to contact us that works like email to contact your physician or physician's nurse for non-urgent questions or concerns.

## 2020-09-13 NOTE — Unmapped (Signed)
Hi       All appt's moved to 9/21

## 2020-09-13 NOTE — Unmapped (Signed)
[name] Clinical Assessment & Refill Coordination Note    Kenneth Fitzgerald [facility], DOB: 03/28/1975  Phone: There are no phone numbers on file.    All above HIPAA information was verified with patient.     Was a Nurse, learning disability used for this call? No    Specialty Medication(s):   Hematology/Oncology: [facility]     Current Outpatient Medications   Medication Sig Dispense Refill   ??? acetaminophen (TYLENOL) 500 MG tablet Take 2 tablets (1,000 mg total) by mouth every eight (8) hours as needed for pain. 30 tablet 0   ??? binimetinib ([name]) 15 mg tablet Take 3 tablets (45 mg total) by mouth Two (2) times a day. 180 tablet 11   ??? blood sugar diagnostic Strp Dispense 100 blood glucose test strips, ok to sub any brand preferred by insurance/patient, use 3x/day; dispense whatever brand matches with meter. 100 strip 12   ??? blood-glucose meter kit Use as instructed; dispense 1 meter, whatever is preferred by insurance 1 each 1   ??? calcitrioL (ROCALTROL) 0.25 MCG capsule Take 1 capsule (0.25 mcg total) by mouth daily. 90 capsule 3   ??? calcium carbonate 650 mg calcium (1,625 mg) tablet Take 1 tablet by mouth Three (3) times a day with a meal.     ??? DULoxetine (CYMBALTA) 30 MG capsule TAKE 1 CAPSULE (30 MG TOTAL) BY MOUTH TWO (2) TIMES A DAY. 180 capsule 2   ??? encorafenib (BRAFTOVI) 75 mg capsule Take 6 capsules (450 mg total) by mouth daily. 180 capsule 11   ??? fexofenadine (ALLEGRA ALLERGY) 180 MG tablet Take 180 mg by mouth daily as needed (allergies).      ??? fluticasone propionate (FLONASE) 50 mcg/actuation nasal spray 2 sprays into each nostril two (2) times a day. 16 g 6   ??? folic acid (FOLVITE) 1 MG tablet Take 1 tablet (1 mg total) by mouth daily. 30 tablet 11   ??? ibuprofen (ADVIL,MOTRIN) 200 MG tablet Take 600 mg by mouth as needed in the morning for pain.     ??? lancets Misc Dispense 100 lancets, ok to sub any brand preferred by insurance/patient, use 3x/day 100 each 12   ??? levothyroxine (SYNTHROID) 125 MCG tablet Take 2 tablets (250 mcg total) every Sunday (ie 1 day per week). Take 3 tablets (375 mcg total) on Monday-Saturday (ie 6 days per week). 270 tablet 3   ??? memantine (NAMENDA) 10 MG tablet Take 10 mg by mouth daily.     ??? memantine (NAMENDA) 10 MG tablet TAKE 1 TABLET BY MOUTH TWO TIMES A DAY. 180 tablet 4   ??? mirtazapine (REMERON) 7.5 MG tablet Take 1 tablet (7.5 mg total) by mouth nightly. 90 tablet 0   ??? ofloxacin (OCUFLOX) 0.3 % ophthalmic solution Instill 4 drops in left ear twice daily for 5 days 5 mL 3   ??? ondansetron (ZOFRAN) 8 MG tablet Take 1 tablet by mouth Take as directed.     ??? sulfaSALAzine (AZULFIDINE) 500 mg tablet Take 2 tablets (1000mg ) by mouth twice daily for 7 days, then take 4 tablets (2000mg ) twice daily thereafter. 240 tablet 11     No current facility-administered medications for this visit.        Changes to medications: Brandol reports no changes at this time.    Allergies   Allergen Reactions   ??? Compazine [Prochlorperazine] Itching   ??? Coconut Nausea And Vomiting   ??? Multihance [Gadobenate Dimeglumine] Other (See Comments)  Patient sneezed immediately after administration of contrast.       Changes to allergies: No    SPECIALTY MEDICATION ADHERENCE     [name] 15 mg: 14 days of medicine on hand     Medication Adherence    Patient reported X missed doses in the last month: 0  Specialty Medication: [name] 15mg           Specialty medication(s) dose(s) confirmed: Regimen is correct and unchanged.     Are there any concerns with adherence? No    Adherence counseling provided? Not needed    CLINICAL MANAGEMENT AND INTERVENTION      Clinical Benefit Assessment:    Do you feel the medicine is effective or helping your condition? Yes    Clinical Benefit counseling provided? Not needed    Adverse Effects Assessment:    Are you experiencing any side effects? No    Are you experiencing difficulty administering your medicine? No    Quality of Life Assessment:    Quality of Life      Oncology  1. What impact has your specialty medication had on the reduction of your daily pain or discomfort level?: None  2. On a scale of 1-10, how would you rate your ability to manage side effects associated with your specialty medication? (1=no issues, 10 = unable to take medication due to side effects): 1          How many days over the past month did your condition/medication  keep you from your normal activities? For example, brushing your teeth or getting up in the morning. 0    Have you discussed this with your provider? Not needed    Acute Infection Status:    Acute infections noted within Epic:  No active infections  Patient reported infection: None    Therapy Appropriateness:    Is therapy appropriate? Yes, therapy is appropriate and should be continued    DISEASE/MEDICATION-SPECIFIC INFORMATION      N/A    PATIENT SPECIFIC NEEDS     - Does the patient have any physical, cognitive, or cultural barriers? No    - Is the patient high risk? Yes, patient is taking oral chemotherapy. Appropriateness of therapy as been assessed    - Does the patient require a Care Management Plan? No     - Does the patient require physician intervention or other additional services (i.e. nutrition, smoking cessation, social work)? No      SHIPPING     Specialty Medication(s) to be Shipped:   Hematology/Oncology: [name]    Other medication(s) to be shipped: No additional medications requested for fill at this time     Changes to insurance: No    Delivery Scheduled: Yes, Expected medication delivery date: 09/20/20.     Medication will be delivered via UPS to the confirmed prescription address in [facility].    The patient will receive a drug information handout for each medication shipped and additional FDA Medication Guides as required.  Verified that patient has previously received a Conservation officer, historic buildings and a Surveyor, mining.    The patient or caregiver noted above participated in the development of this care plan and knows that they can request review of or adjustments to the care plan at any time.      All of the patient's questions and concerns have been addressed.    Rollen Sox   [facility] Specialty Pharmacist

## 2020-09-18 MED FILL — SULFASALAZINE 500 MG TABLET: 30 days supply | Qty: 240 | Fill #8

## 2020-09-18 MED FILL — MEKTOVI 15 MG TABLET: ORAL | 30 days supply | Qty: 180 | Fill #11

## 2020-09-18 MED FILL — BRAFTOVI 75 MG CAPSULE: ORAL | 30 days supply | Qty: 180 | Fill #11

## 2020-09-19 NOTE — Unmapped (Signed)
I called Kenneth Fitzgerald after receiving his MyChart message.   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 He may have been dehydrated. He does not check his blood glucose regularly. NO signs of blood loss in stool or urine. He took it easy ' on Saturday but then had an active day Sunday. Aside from a slight headache, he feels well.Marland Kitchen He will have scans on the 21st. I have shared his message with Dr.  Nedra Hai and wait to hear if she would like a work-up sooner.

## 2020-09-26 ENCOUNTER — Ambulatory Visit: Admit: 2020-09-26 | Payer: PRIVATE HEALTH INSURANCE | Attending: Clinical | Primary: Clinical

## 2020-09-27 ENCOUNTER — Ambulatory Visit: Admit: 2020-09-27 | Payer: PRIVATE HEALTH INSURANCE | Attending: Radiation Oncology | Primary: Radiation Oncology

## 2020-10-02 ENCOUNTER — Telehealth: Admit: 2020-10-02 | Discharge: 2020-10-03 | Payer: PRIVATE HEALTH INSURANCE

## 2020-10-02 DIAGNOSIS — E890 Postprocedural hypothyroidism: Principal | ICD-10-CM

## 2020-10-02 DIAGNOSIS — C73 Malignant neoplasm of thyroid gland: Principal | ICD-10-CM

## 2020-10-02 NOTE — Unmapped (Signed)
The patient reports they are currently: at home. I spent 10 minutes on the real-time audio and video visit with the patient on the date of service. I spent an additional 7 minutes on pre- and post-visit activities on the date of service.     The patient was not located and I was located within 250 yards of a hospital based location during the real-time audio and video visit. The patient was physically located in [place] or a state in which I am permitted to provide care. The patient and/or parent/guardian understood that s/he may incur co-pays and cost sharing, and agreed to the telemedicine visit. The visit was reasonable and appropriate under the circumstances given the patient's presentation at the time.    The patient and/or parent/guardian has been advised of the potential risks and limitations of this mode of treatment (including, but not limited to, the absence of in-person examination) and has agreed to be treated using telemedicine. The patient's/patient's family's questions regarding telemedicine have been answered.    If the visit was completed in an ambulatory setting, the patient and/or parent/guardian has also been advised to contact their provider???s office for worsening conditions, and seek emergency medical treatment and/or call 911 if the patient deems either necessary.      Assessment/Plan:    1. T1N1b papillary thyroid cancer (classic/follicular variant), multifocal, s/p 3 surgeries and 2 doses of I-131. Last treatment was in 2015. Has indeterminate response to treatment based on persistent detectable Tg (< 1 on suppression, rose to 2.4 with stimulation in Jan 2018).  Note that he also has stage IV melanoma, followed by oncology.  -Thyrogen-stimulated WBS was negative in 01/2016; stimulated Tg was 2.4  -Tg on suppression has been stable, last check 08/2019, 0.3 at that time with negative antibody.  Repeat Tg/Ab with next labs on Wednesday  -10/2018 neck US stable. Repeat US in the next few months, order placed    2. Hypothyroidism, with wide TSH fluctuations in TSH on similar doses of thyroid hormone, perhaps related to wt changes, absorption issues, and/or variable adherence (in past). Last 2 TSH values have been above target.   - Increase LT4 to 375 mcg/day (slight increase)  - Check TFTs in 2 days as part of Tg/Ab panel, and then again 2-3 mo later to assess new dose effect   - Follow both TSH and FT4 from now on re: brain XRT x 10 days from 12/2019-01/2020  -Reviewed appropriate administration, and factors that can affect dose needs or laboratory measurement    3. Hypocalcemia, severe, started calcitriol 05/2015. Low calcium in 12/2017 d/t low calcium intake at that time. Normal level in 05/2018 and in 06/2018 off of calcitriol, but significantly low again in 07/2018. Has been back on calcitriol 0.25 mg/day since then.  -Last few Ca checks stable and at or close to goal, no significant symptoms  -Reviewed goals of treatment and symptoms or signs with which to take extra calcium and/or notify us.     4. Hyperglycemia, new in 03/2018, in the setting of 4 weeks of prednisone to treat a reaction to immunotherapy.  A1c 6.0% in 06/2018.  Alveda Reasons MTF short time, A1c improved in 08/2019, has been off MTF since 12/2019 due to significant weight loss and GI symptoms following brain XRT in 12/2019  -A1c normal 04/2020. Glucose values on recent panels have been normal.     Return in about 6 months (around 04/01/2021).     Orders Placed This Encounter   Procedures   ???  US Soft Tissue Head And Neck   ??? TSH   ??? T4, Free   ??? Thyroglobulin Tumor Marker, Serum             Reason For Visit:   Chief Complaint   Patient presents with   ??? Follow-up thyroid cancer and hypoparathyroidism      Subjective:     History of Present Illness:  Kenneth Fitzgerald is a 45 y.o. male with PTC who was last seen 04/2020.    ?? 05/10/20 - visit, taking LT4 to 250 mcg x 2 days/week and 375 mcg x 5, out of calcitriol x 1.5 weeks, no low Ca symptoms, lost 30-40 lbs after brain XRT in 12/2019, RTC 3 mo same day as next Onc visit; incr LT4 to 250 x 1 and 375 x 6; repeat Ca/Phos/alb/TSH/FT4 on 06/21/20 when he sees Onc  ?? 08/14/20 - no show for me  ?? 09/13/20 - TSH 7.3, FT4 0.79, may need to incr LT4 to  375 x 7, asked FD to schedule in person or video visit    Taking LT4 250 mcg x 1 day/week, and 375 mcg x 6 days/week. Good adherence. Take in AM. No biotin supplements. No overt hypothyroid or hyperthyroid symptoms. No neck pain, swelling, globus, dysphagia, or dysphonia.    Taking calcitriol 0.25 mcg/day. Taking supplemental calcium 1000 mg/day.  Dietary calcium - 1-2 servings. Has vit D in his ca supplements. No kidney stones. No recent significant hypoCa symptoms.      Had one syncopal episode a few weeks ago, thinks he got up too quickly, hit his head on doorframe, recovered without problems. Occasional LH/dizziness. Has d/w Onc team. Seeing them in 2 days. No n/v/low BP, no loss of appetite or wt loss, no recent steroid exposures.     Weight trend:  Wt Readings from Last 6 Encounters:   09/13/20 (!) 137.4 kg (303 lb)   06/21/20 (!) 134.2 kg (295 lb 12.8 oz)   06/21/20 (!) 133.8 kg (294 lb 14.4 oz)   06/08/20 (!) 133.8 kg (295 lb)   05/10/20 (!) 131.5 kg (290 lb)   04/19/20 (!) 132.3 kg (291 lb 9.6 oz)       PMH:  1. 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 Thyroidectomy 06/25/2011 with 1 foci each lobe and 2/2 central LN (left Level 6)     1b. Thyrogen-based RAI 125 mCi 03/2012 with pre/post scans showing fairly large focus of bed activity and 4.2% uptake; pretx stimulated Tg of 73     1c. Recurrence on [place] 09/2012 in left thyroid bed and confirmed by FNA 10/2012; left ND in 11/2012 levels 3-6 with 12/16 positive (largest 1.8 cm, and some with extranodal extension)     1d. Right CND 03/2013 levels 2-4 with 3/20 positive (largest 3.5 mm), found incidentally as the right ND was performed for melanoma recurrence which was 1 cm node in 1 of 20 nodes     1e. Thyrogen-based 150 mCi in 05/2013 [LID as well before the treatment]      79f. Thyrogen-stimulated Tg was 2.4 in 01/2016. WBS in 01/2016 was negative.  2. Postsurgical hypothyroidism  3. Melanoma - scalp lesion, surgery including RND, and systemic therapy ending 01/2012; recurrence in a 1 cm right LN removed 03/2013 on repeat RND; XRT completed 06/2013; whole brain XRT for brain med 12/2019-01/2020  4. Diverticulitis  5. H/o anklyosing spondylitis 2009  6.  Small bowel obstruction, hospitalization 11/2018, due  to adhesions from past abdominal surgeries including hernia repairs  7. Elevated A1c of 6% in 06/2018  8. Crohns, diagnosed 09/2019 by Dr. Stevphen Rochester in GI, but likely present since 2019 per note, defer immunosuppressive therapy re: active melanoma, start w sulfasalazine     PSH reviewed in Epic      Current Outpatient Medications:   ???  acetaminophen (TYLENOL) 500 MG tablet, Take 2 tablets (1,000 mg total) by mouth every eight (8) hours as needed for pain., Disp: 30 tablet, Rfl: 0  ???  binimetinib ([name]) 15 mg tablet, Take 3 tablets (45 mg total) by mouth Two (2) times a day., Disp: 180 tablet, Rfl: 11  ???  blood sugar diagnostic Strp, Dispense 100 blood glucose test strips, ok to sub any brand preferred by insurance/patient, use 3x/day; dispense whatever brand matches with meter., Disp: 100 strip, Rfl: 12  ???  blood-glucose meter kit, Use as instructed; dispense 1 meter, whatever is preferred by insurance, Disp: 1 each, Rfl: 1  ???  calcitrioL (ROCALTROL) 0.25 MCG capsule, Take 1 capsule (0.25 mcg total) by mouth daily., Disp: 90 capsule, Rfl: 3  ???  calcium carbonate 650 mg calcium (1,625 mg) tablet, Take 1 tablet by mouth Three (3) times a day with a meal., Disp: , Rfl:   ???  DULoxetine (CYMBALTA) 30 MG capsule, TAKE 1 CAPSULE (30 MG TOTAL) BY MOUTH TWO (2) TIMES A DAY., Disp: 180 capsule, Rfl: 2  ???  encorafenib (BRAFTOVI) 75 mg capsule, Take 6 capsules (450 mg total) by mouth daily., Disp: 180 capsule, Rfl: 11  ??? fexofenadine (ALLEGRA ALLERGY) 180 MG tablet, Take 180 mg by mouth daily as needed (allergies). , Disp: , Rfl:   ???  fluticasone propionate (FLONASE) 50 mcg/actuation nasal spray, 2 sprays into each nostril two (2) times a day., Disp: 16 g, Rfl: 6  ???  folic acid (FOLVITE) 1 MG tablet, Take 1 tablet (1 mg total) by mouth daily., Disp: 30 tablet, Rfl: 11  ???  ibuprofen (ADVIL,MOTRIN) 200 MG tablet, Take 600 mg by mouth as needed in the morning for pain., Disp: , Rfl:   ???  lancets Misc, Dispense 100 lancets, ok to sub any brand preferred by insurance/patient, use 3x/day, Disp: 100 each, Rfl: 12  ???  levothyroxine (SYNTHROID) 125 MCG tablet, Take 2 tablets (250 mcg total) every Sunday (ie 1 day per week). Take 3 tablets (375 mcg total) on Monday-Saturday (ie 6 days per week)., Disp: 270 tablet, Rfl: 3  ???  memantine (NAMENDA) 10 MG tablet, Take 10 mg by mouth daily., Disp: , Rfl:   ???  memantine (NAMENDA) 10 MG tablet, TAKE 1 TABLET BY MOUTH TWO TIMES A DAY., Disp: 180 tablet, Rfl: 4  ???  mirtazapine (REMERON) 7.5 MG tablet, Take 1 tablet (7.5 mg total) by mouth nightly., Disp: 90 tablet, Rfl: 0  ???  ofloxacin (OCUFLOX) 0.3 % ophthalmic solution, Instill 4 drops in left ear twice daily for 5 days, Disp: 5 mL, Rfl: 3  ???  ondansetron (ZOFRAN) 8 MG tablet, Take 1 tablet by mouth Take as directed., Disp: , Rfl:   ???  sulfaSALAzine (AZULFIDINE) 500 mg tablet, Take 2 tablets (1000mg ) by mouth twice daily for 7 days, then take 4 tablets (2000mg ) twice daily thereafter., Disp: 240 tablet, Rfl: 11      Allergies   Allergen Reactions   ??? Compazine [Prochlorperazine] Itching   ??? Coconut Nausea And Vomiting   ??? Multihance [Gadobenate Dimeglumine] Other (See Comments)  Patient sneezed immediately after administration of contrast.       ROS  No chest pain or SOB. No f/c.  No cough.  No fractures or falls. Remainder of 8 systems reviewed were negative except as noted in the history of present illness.    Social Hx:  -Living in [place].  -Working at [employer], a Quarry manager company, changed to desk job in 07/07/2020  -Nonsmoker    Family History   Problem Relation Age of Onset   ??? Hyperthyroidism Mother    ??? Osteoporosis Mother    ??? Arrhythmia Mother    ??? Squamous cell carcinoma Mother         basal cell vs squamous cell skin cancer   ??? Coronary artery disease Father         s/p CABG   ??? Diabetes Father    ??? Hypertension Father    ??? Prostate cancer Father    ??? Colon cancer Paternal Grandmother    ??? Thyroid disease Neg Hx    Father died 2017/07/07 with diabetes and heart disease  Mother died in July 07, 2012    Objective:     Physical Exam:  There were no vitals taken for this visit.   BP Readings from Last 3 Encounters:   09/13/20 130/91   06/21/20 132/90   05/10/20 130/99     General appearance - pleasant, conversant, NAD; voice and speech normal  Neck - supple on gross visualization, rest of neck exam limited by Video Visit  Eyes -  No obvious stare or lid edema  Chest - nL apparent WOB at rest at his desk  Skin - no visible facial rashes      05/10/20 in person visit:  General appearance - Pleasant, conversant, NAD. Generalized and abdominal obesity.   Eyes -  No lid lag or stare, no lid edema  Neck - Supple w/o palpable LAD, no masses appreciated. Well healed incision from thyroidectomy.and previous LND.   Lymphatics - no cervical or supraclavicular adenopathy appreciated   Resp - clear to auscultation bilaterally  CV - normal rate, regular rhythm  Neurological - no hand tremors, 2+ upper extremity DTRs  Extremities - peripheral pulses normal, no lower extremity edema  Skin - warm, dry, no acanthosis.    Data Review:    TSH   Date Value   09/13/2020 7.389 uIU/mL (H)   05/10/2020 9.387 uIU/mL (H)   01/03/2020 26.788 uIU/mL (H)   10/04/2013 0.11 u[iU]/mL (L)   06/28/2013 0.28 u[iU]/mL (L)   04/27/2013 0.09 u[iU]/mL (L)     Free T4 (ng/dL)   Date Value   16/10/9602 0.79 (L)   05/10/2020 0.94   01/03/2020 0.88 (L)   10/04/2013 1.55 (H) 06/28/2013 1.40   04/27/2013 1.69 (H)     THYROGLOBULIN AB (IU/mL)   Date Value   08/30/2019 <1.8   06/22/2018 <1.8   12/17/2017 <1.8     Thyroglobulin, Tumor Marker, IA (ng/mL)   Date Value   08/30/2019 0.3 (H)   06/22/2018 0.2 (H)   12/17/2017 0.3 (H)      Lab Results   Component Value Date    CALCIUM 8.3 (L) 09/13/2020    CALCIUM 8.2 (L) 06/21/2020    CALCIUM 7.6 (L) 05/10/2020    PHOS 5.8 (H) 05/10/2020    PHOS 4.9 02/01/2020    PHOS 5.2 (H) 01/31/2020    CREATININE 0.89 09/13/2020    CREATININE 0.97 06/21/2020    CREATININE 0.93 05/10/2020  VITDTOTAL 31.0 02/27/2017    VITDTOTAL 37 12/04/2015    VITDTOTAL 34 12/12/2014    PTH 9.9 (L) 12/04/2015    PTH 12.4 12/12/2014    PTH 12 04/27/2013     Lab Results   Component Value Date    A1C 5.4 05/10/2020     Calcium values while hospitalized in 06/2016 were low at less than 7, and had low ionized calcium as well.     01/2016 Thyrogen stimulated testing  - Tg rose to 2.4    PATH from 03/2013 right neck dissection:  Amended: 03/31/2013 by Kirkland Hun, MD  Reason: Additional Information  Comment: The report is amended to add the results of a BRAF V600E immunostain.  The final diagnosis is unchanged.  Previous Signout Date: 03/30/2013  Diagnosis:  Lymph nodes, right cervical, levels 2-4, removal and partial parotidectomy  -Metastatic melanoma involving 1 out of 20 lymph nodes, with the largest diameter measuring 10 mm (1.0 cm) and no evidence of extracapsular extension (1/20)  -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, with the largest diameter measuring 3.5 mm and no evidence of extracapsular extension (3/20)   -BRAF V600E immunohistochemical staining is positive (3+ staining, 100% of cells) --> this was on LN with melanoma  -Benign parotid gland is also present  *Molecular testing:  RESULTS:  Gene Variants of Known Clinical Utility:   BRAF: No mutation detected (see note) --> A BRAF V600E immunohistochemical stain was performed on the tissue section of this case and was positive (see case [id number]). Though within validated parameters, the tumor input for sequencing in this case was low. This could result in the V600E mutant allele frequency falling below the limit of detection for the assay. [Again on LN with melanoma]  KIT: No mutation detected     Radiology:      Results for orders placed during the hospital encounter of 10/28/18   US Soft Tissue Head And Neck    Narrative EXAM: US SOFT TISSUE HEAD AND NECK  DATE: 10/28/2018 9:20 AM  ACCESSION: [id number]  DICTATED: 10/28/2018 10:48 AM  INTERPRETATION LOCATION: [facility]    CLINICAL INDICATION: 45 years old Male with thyroid cancer  - C73 - Thyroid cancer (CMS - HCC)     COMPARISON: Thyroid ultrasound from 12/17/2017 and earlier examinations    TECHNIQUE:  Ultrasound views of the thyroid were obtained using gray scale and limited color Doppler imaging.    FINDINGS:  Status post total thyroidectomy. Redemonstration of heterogeneous hypoechoic nodule in the midline, superior to the thyroidectomy bed measuring 1.1 x 1 x 0.5 cm, previously 1.1 x 1.0 x 0.6 cm. No new soft tissues are noted in the thyroidectomy bed.    Lymph nodes: No adenopathy      Impression Status post total thyroidectomy. Unchanged 1.1 cm heterogeneous nodule in the midline superior to the thyroidectomy bed. Appearance is similar since 02/02/2015, likely nonaggressive process. Subcentimeter cervical chain lymph nodes measuring up to 0.7 cm and left level 2, previously 0.4 cm.    Please see below for data measurements:    Lymph node measurements:  Right I 0.48 cm  Right II 0.18 cm  Right V 0.14 cm    Left I 0.49 cm  Left II 0.68 cm  Left III 0.36 cm  Left IV 0.48 cm  Left V 0.29 cm   [Images reviewed 10/2018]    01/2016 Whole body scan:  FINDINGS: ??There are no abnormal foci of increased radiotracer  uptake identified. ??There is physiologic uptake seen in the nasopharynx, the bowel, stomach, and bladder.  Impression  No evidence of I-131 avid metastasis.    05/2013 had stimulated PET and WBS, as well as 1 week post treatment WBS     Other Medical Data:

## 2020-10-03 ENCOUNTER — Telehealth: Admit: 2020-10-03 | Discharge: 2020-10-04 | Payer: PRIVATE HEALTH INSURANCE | Attending: Clinical | Primary: Clinical

## 2020-10-03 DIAGNOSIS — C801 Malignant (primary) neoplasm, unspecified: Principal | ICD-10-CM

## 2020-10-03 DIAGNOSIS — F411 Generalized anxiety disorder: Principal | ICD-10-CM

## 2020-10-03 DIAGNOSIS — C799 Secondary malignant neoplasm of unspecified site: Principal | ICD-10-CM

## 2020-10-03 DIAGNOSIS — F330 Major depressive disorder, recurrent, mild: Principal | ICD-10-CM

## 2020-10-03 DIAGNOSIS — C439 Malignant melanoma of skin, unspecified: Principal | ICD-10-CM

## 2020-10-03 MED ORDER — BRAFTOVI 75 MG CAPSULE
ORAL_CAPSULE | Freq: Every day | ORAL | 11 refills | 30.00000 days
Start: 2020-10-03 — End: ?

## 2020-10-03 MED ORDER — MEKTOVI 15 MG TABLET
ORAL_TABLET | Freq: Two times a day (BID) | ORAL | 11 refills | 30.00000 days
Start: 2020-10-03 — End: ?

## 2020-10-03 NOTE — Unmapped (Signed)
[facility] Refill Coordination Note    Specialty Medication(s) to be Shipped:   Hematology/Oncology: [name] and [name]    Other medication(s) to be shipped: No additional medications requested for fill at this time     Kenneth Fitzgerald, DOB: [birth year]  Phone: There are no phone numbers on file.      All above HIPAA information was verified with patient.     Was a Nurse, learning disability used for this call? No    Completed refill call assessment today to schedule patient's medication shipment from the [facility] [phone]).  All relevant notes have been reviewed.     Specialty medication(s) and dose(s) confirmed: Regimen is correct and unchanged.   Changes to medications: [name] reports no changes at this time.  Changes to insurance: No  New side effects reported not previously addressed with a pharmacist or physician: None reported  Questions for the pharmacist: No    Confirmed patient received a Conservation officer, historic buildings and a Surveyor, mining with first shipment. The patient will receive a drug information handout for each medication shipped and additional FDA Medication Guides as required.       DISEASE/MEDICATION-SPECIFIC INFORMATION        N/A    SPECIALTY MEDICATION ADHERENCE     Medication Adherence    Patient reported X missed doses in the last month: 1  Specialty Medication: [name] 15 mg  Patient is on additional specialty medications: Yes  Additional Specialty Medications: Braftovi 75mg   Patient Reported Additional Medication X Missed Doses in the Last Month: 1  Patient is on more than two specialty medications: No  Informant: patient              Were doses missed due to medication being on hold? No    Braftovi  75 mg: 14 days of medicine on hand   [name] 15 mg: 14 days of medicine on hand        REFERRAL TO PHARMACIST     Referral to the pharmacist: Not needed      [facility]     Shipping address confirmed in Epic.     Delivery Scheduled: Yes, Expected medication delivery date: 10/18/20.  However, Rx request for refills was sent to the provider as there are none remaining.     Medication will be delivered via UPS to the prescription address in Epic [place].    Kenneth Fitzgerald   [facility] Specialty Technician

## 2020-10-03 NOTE — Unmapped (Signed)
[facility]  Psychiatry  Psychotherapy Note - Telehealth via??Video  ??  Service Date: September 20,??2022  Service:??50??minutes of??psychotherapy via??video??session  Time with Patient:??50??minutes  ??  Encounter Description:??This encounter was conducted from provider's office via EPIC video session due to COVID-19 pandemic. Ridley Schewe was located in his home. Session was conducted via EPIC.  Rationale for??video session??is need to social distance. See Plan for telemedicine consent/disclaimer.   ??  Encounter Description/Consent:??  [name] visit was completed through telehealth encounter (video).   ??  This patient encounter is appropriate and reasonable under the circumstances. The patient has been advised of the potential risks and limitations of this mode of treatment (including, but not limited to, the absence of in-person examination) and has agreed to be treated in a remote fashion in spite of them. Any and all of the patient's/patient's family's questions on this issue have been answered.   ??  The patient was physically located in [place] in which I am permitted to provide care. The patient??understood that??he may incur co-pays and cost sharing, and agreed to the telemedicine visit. The visit was??reasonable and appropriate under the circumstances given the patient's presentation at the time.   ??  Time Spent: 50??minutes  ??  Assessment:  Mr. Badley a 45yo??male??with metastatic melanoma, h/o thyroid cancer, recent portal vein thrombosis, who I see in therapy to address depression and enhance coping with??multiple??stressors.????We met today via telehealth (video). ??Mr. Theiss reported no meaningful psychosocial changes since our last session.  He continues to experience anxiety and poor mood related to new legal issues and medical stressors. He has scans scheduled for tomorrow; denied overwhelming anxiety.  I offered psychotherapy, support,??and recommendations for tolerating and coping with distress. ??Will see next??in 2-3 weeks.  ??  Risk Assessment:  A suicide and violence risk assessment was performed as part of this evaluation. There patient is deemed to be at chronic elevated risk??for self-harm/suicide given the following factors: current diagnosis of depression, suicidal ideation or threats without a plan and past diagnosis of depression. There patient is deemed to be at chronic elevated risk??for violence given the following factors: male gender. These risk factors are mitigated by the following factors: lack of active SI/HI. There is no acute risk for suicide or violence at this time. The patient was educated about relevant modifiable risk factors including following recommendations for treatment of psychiatric illness and abstaining from substance abuse.??While future psychiatric events cannot be accurately predicted, the patient does not currently require acute inpatient psychiatric care and does not currently meet [facility] involuntary commitment criteria.??????????????????  ??  Plan:  1.??I will continue to see pt for psychotherapy. ??Next session??is??scheduled??to take place on 10/10  2. Pt??has??information on available [facility] resources.??Pt is working with [name], [name]. ??I have??previously??reached out to [name] to explore financial resources.  3.??Pt??has been followed previously by Dr. Lawernce Keas of Psychiatry.  4. Pt has??my contact information and knows??to reach me as needed.  ??  Subjective:??  Mr. Pawloski??presented as alert and oriented. He affect was appropriate and he was engaged during the session.    Mr. Hodkinson reported no significant or meaningful changes in his psychosocial functioning in the time since our last session.  He endorsed some poorer mood and sleep, and has continued to ruminate on the financial, social and self-esteem ramifications of his legal situation (no new developments over the last several weeks). He appreciates that at this point the next steps in the case are not within his control, which  is understandably challenging. He does feel supported by those people who are very close to him. No thoughts or intent to harm self or others.    Mr. Lombardo shared that he has scans scheduled for tomorrow. He noted a syncope episode 1-2 weeks ago that resulted in fall; described as scary, but is relieved that he has not experienced since then.  The episode has led to some anxiety about tomorrow's scans, although Mr. Worth stated luckily I've been so busy that I haven't been thinking about it too much.    I provided Mr. Spivack support and understanding, and offered him with a forum to process his thoughts and feelings. My sense is that the therapeutic arena is important for him at this time.  We reviewed and problem-solved steps that he can take - as well as ways to tolerate distressing aspects of a situation that he has very little power to change at this time. Mr. Falkner was engaged during our session.  We will meet next on 10/10.    Mental Status Exam:  Appearance:  ?? ??Appears stated age   Motor: ?? ??No abnormal movements   Speech/Language:  ?? ??Normal rate, volume, tone, fluency   Mood: ?? ??about the same   Affect: ?? ??engaged   Thought process: ?? ??Logical, linear, clear, coherent, goal-directed   Thought content: ?? ?? ??Denies SI/HI or thought of self-harm   Perceptual disturbances: ?? ?? ??Behavior not concerning for response to internal stimuli  ??   Orientation: ?? ??Oriented to person, place, time, and general circumstances   Attention: ?? ??Able to fully attend without fluctuations in consciousness   Concentration: ?? ??Able to fully concentrate and attend   Memory: ?? ??Immediate, short-term, long-term, and recall grossly intact   ??Fund of knowledge:  ?? ??Consistent with level of education and development   Insight: ?? ?? ??Intact   Judgment:  ?? ??Intact   Impulse Control: ?? ??Intact   ??  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  [name]. Nasim Garofano PhD  September 20,??2022

## 2020-10-04 ENCOUNTER — Ambulatory Visit: Admit: 2020-10-04 | Discharge: 2020-10-05 | Payer: PRIVATE HEALTH INSURANCE

## 2020-10-04 ENCOUNTER — Ambulatory Visit
Admit: 2020-10-04 | Discharge: 2020-10-05 | Payer: PRIVATE HEALTH INSURANCE | Attending: Hematology & Oncology | Primary: Hematology & Oncology

## 2020-10-04 ENCOUNTER — Other Ambulatory Visit: Admit: 2020-10-04 | Discharge: 2020-10-05 | Payer: PRIVATE HEALTH INSURANCE

## 2020-10-04 DIAGNOSIS — C439 Malignant melanoma of skin, unspecified: Principal | ICD-10-CM

## 2020-10-04 LAB — CBC W/ AUTO DIFF
BASOPHILS ABSOLUTE COUNT: 0.1 10*9/L (ref 0.0–0.1)
BASOPHILS RELATIVE PERCENT: 1.8 %
EOSINOPHILS ABSOLUTE COUNT: 0.2 10*9/L (ref 0.0–0.5)
EOSINOPHILS RELATIVE PERCENT: 3.2 %
HEMATOCRIT: 38.5 % — ABNORMAL LOW (ref 39.0–48.0)
HEMOGLOBIN: 12.5 g/dL — ABNORMAL LOW (ref 12.9–16.5)
LYMPHOCYTES ABSOLUTE COUNT: 1.6 10*9/L (ref 1.1–3.6)
LYMPHOCYTES RELATIVE PERCENT: 22.5 %
MEAN CORPUSCULAR HEMOGLOBIN CONC: 32.4 g/dL (ref 32.0–36.0)
MEAN CORPUSCULAR HEMOGLOBIN: 25.4 pg — ABNORMAL LOW (ref 25.9–32.4)
MEAN CORPUSCULAR VOLUME: 78.5 fL (ref 77.6–95.7)
MEAN PLATELET VOLUME: 8.8 fL (ref 6.8–10.7)
MONOCYTES ABSOLUTE COUNT: 0.4 10*9/L (ref 0.3–0.8)
MONOCYTES RELATIVE PERCENT: 5.7 %
NEUTROPHILS ABSOLUTE COUNT: 4.8 10*9/L (ref 1.8–7.8)
NEUTROPHILS RELATIVE PERCENT: 66.8 %
PLATELET COUNT: 237 10*9/L (ref 150–450)
RED BLOOD CELL COUNT: 4.91 10*12/L (ref 4.26–5.60)
RED CELL DISTRIBUTION WIDTH: 17.8 % — ABNORMAL HIGH (ref 12.2–15.2)
WBC ADJUSTED: 7.2 10*9/L (ref 3.6–11.2)

## 2020-10-04 LAB — SLIDE REVIEW

## 2020-10-04 LAB — COMPREHENSIVE METABOLIC PANEL
ALBUMIN: 4.1 g/dL (ref 3.4–5.0)
ALKALINE PHOSPHATASE: 64 U/L (ref 46–116)
ALT (SGPT): 19 U/L (ref 10–49)
ANION GAP: 7 mmol/L (ref 5–14)
AST (SGOT): 25 U/L (ref <=34)
BILIRUBIN TOTAL: 0.3 mg/dL (ref 0.3–1.2)
BLOOD UREA NITROGEN: 17 mg/dL (ref 9–23)
BUN / CREAT RATIO: 18
CALCIUM: 8.4 mg/dL — ABNORMAL LOW (ref 8.7–10.4)
CHLORIDE: 107 mmol/L (ref 98–107)
CO2: 25 mmol/L (ref 20.0–31.0)
CREATININE: 0.94 mg/dL
EGFR CKD-EPI (2021) MALE: >90 mL/min/{1.73_m2} (ref >=60)
GLUCOSE RANDOM: 100 mg/dL (ref 70–179)
POTASSIUM: 3.9 mmol/L (ref 3.4–4.8)
PROTEIN TOTAL: 7.1 g/dL (ref 5.7–8.2)
SODIUM: 139 mmol/L (ref 135–145)

## 2020-10-04 LAB — LACTATE DEHYDROGENASE: LACTATE DEHYDROGENASE: 211 U/L (ref 120–246)

## 2020-10-04 MED ADMIN — iohexoL (OMNIPAQUE) 350 mg iodine/mL solution 75 mL: 75 mL | INTRAVENOUS | @ 16:00:00 | Stop: 2020-10-04

## 2020-10-04 MED ADMIN — gadobenate dimeglumine (MULTIHANCE) 529 mg/mL (0.1mmol/0.2mL) solution 20 mL: 20 mL | INTRAVENOUS | @ 18:00:00 | Stop: 2020-10-04

## 2020-10-04 NOTE — Unmapped (Signed)
15:15----Labs drawn from pre-existing PIV & sent for analysis.  Line flushed & saline-locked.  PIV removed.   Care provided by [facility] LPN.

## 2020-10-04 NOTE — Unmapped (Signed)
PRIMARY CARE PHYSICIAN  TIMOTHY R DRAPER, DO  [address]  [place] [address]    CONSULTING PHYSICIANS  [name], [place]  [address]  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   -S/p thyroidectomy 06/2011 and radioactive iodine 03/2012 and again 05/2013.    -Thyroid CA recurrence 11/2012 s/p L neck dissecion 12/07/2012.  -2nd thyroid CA recurrence 03/2013 AND co-existing 1st melanoma recurrence 03/2013 s/p R -cervical level 2-4 node dissection and partial parotidectomy  -S/p radiation to R neck, completed 06/2013  -Nivolumab (02/12/18 x 2 cycles) complicated by colitis that required a 6 week steroid taper.   -s/p WBRT, completed 01/20/20    CURRENT THERAPY:   BRAF/MEK started early May 2020    ASSESSMENT: [name] is a pleasant 45 y.o. male who presents today for further evaluation of recurrent stage IV melanoma. 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    PLAN  1. Stage IV melanoma: BRAF+ by IHC. Dr Eyvonne Left reviewed path: TILS present, non-brisk; lymphocytes are 20 to 30% the number of melanoma cells.  Known sites of disease include abdominal/pelvic LN, and brain. Started dabrafenib/trametinib mid-May 2020, has had stable disease extracranially.      - Reviewed path from hernia surgery in Jan 2022, noted that the abdominal wall/peritoneal implants contained melanophages, but no melanoma cells.  We discussed that this is interesting, good to know that there wasn't melanoma in the biopsy, is in keeping w/immune response.    - He is back on BRAF/MEK targeted therapy.  Will continue.   - He is s/p WBRT, completed 01/20/20. MRI brain from 9/21 personally reviewed and c/w stable disease.  Rad/onc is following.  - I personally reviewed CT chest from today 10/04/20 which is NED, abdominal MRI also w/out progression of disease.   -clinically, he is doing very well, much improved over time.  -RTC in 12 weeks for clinical follow up and repeat imaging.  -Of note, we have attempted to confirm BRAF on NGS twice, on original primary specimen from July 2012 and and pelvic LN sample from 01/09/2018.  Both samples were insufficient tissue for analysis.  At this point, BRAF is known by IHC only.     #.) Brain mets; s/p WBRT as per above.  Brain MRI from today 10/04/20.  Asx at this time.     #) L ear fullness, hearing loss; ENT following.  Brain MRI w/sinus and mastoid air cell abnormalities.  Another procedure is planned.     #.) Atypical Crohn's; recent colonoscopy w/ulcerations and crypt abscesses.  Sulfasalazine per GI medicine.    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 During hospital stay, it was determined that this was malignant thrombus and no AC recommended.  Will follow.     #.) Hernia repair; all scars healing well and he is eating and drinking normally.  Surgeon aware abdominal wall finding, CTM.     #.) Thyroid CA, recurrent. Well controlled now. Followed by endocrinology for thyroid and Ca+ replacement and ongoing monitoring of glucose.    #.) Mood; excessive stress related to h/o 2 malignancies, job loss and legal issues surrounding, parents both passed away in recent years.   He follows w/ Drs. Yopp and [facility].     #.)  HypoCA; he struggles w/compliance during the workday,cont Ca supplementation    #.) Supportive care/GOC;appreciate pall care support and med titration. Discussed w/[name]; OK to pause pall care for now given well controlled disease and well controlled symptoms.      INTERVAL HISTORY: This is a return visit to the [facility] for further evaluation of melanoma.  Had symptomatic improvement of hearing loss after myringotomy 5/26 and 09/13/20.   Unfortunately, the symptoms of fullness and hearing loss recurred and persist.   Energy is stable.    Works 45-50hrs/week.  No more dry heaving, no more nausea.  HA/scalp pain much better.  No new CNS symptoms.    Weight loss has stabilized.  Appetite is improved.    Bowels are normal for him; recent diagnosis of atypical crohn's disease, started on sulfasalazine 2 tabs BID  Follows w/Drs. [facility], finds this helpful.Mood is good.       PAST MEDICAL HISTORY:   1. Diverticulitis.   2. History of ankylosing spondylitis in 2009. Please see past medical history dictated on 11/15/2010 for details. In brief, he was treated w/Enbrel and prednisone for 6mos or so.  Symptoms improved, immunosuppressants were stopped and symptoms did not recur.   3. S/p incisional hernia repair       SOCIAL HISTORY:    reports that he has never smoked. He has never used smokeless tobacco. He reports current alcohol use. He reports that he does not use drugs.      MEDICATIONS:   Current Outpatient Medications   Medication Sig Dispense Refill   ??? acetaminophen (TYLENOL) 500 MG tablet Take 2 tablets (1,000 mg total) by mouth every eight (8) hours as needed for pain. 30 tablet 0   ??? binimetinib ([name]) 15 mg tablet Take 3 tablets (45 mg total) by mouth Two (2) times a day. 180 tablet 11   ??? blood sugar diagnostic Strp Dispense 100 blood glucose test strips, ok to sub any brand preferred by insurance/patient, use 3x/day; dispense whatever brand matches with meter. 100 strip 12   ??? blood-glucose meter kit Use as instructed; dispense 1 meter, whatever is preferred by insurance 1 each 1   ??? calcitrioL (ROCALTROL) 0.25 MCG capsule Take 1 capsule (0.25 mcg total) by mouth daily. 90 capsule 3   ??? calcium carbonate 650 mg calcium (1,625 mg) tablet Take 1 tablet by mouth Three (3) times a day with a meal.     ??? DULoxetine (CYMBALTA) 30 MG capsule TAKE 1 CAPSULE (30 MG TOTAL) BY MOUTH TWO (2) TIMES A DAY. 180 capsule 2   ??? encorafenib (BRAFTOVI) 75 mg capsule Take 6 capsules (450 mg total) by mouth daily. 180 capsule 11   ??? fexofenadine (ALLEGRA ALLERGY) 180 MG tablet Take 180 mg by mouth daily as needed (allergies).      ??? fluticasone propionate (FLONASE) 50 mcg/actuation nasal spray 2 sprays into each nostril two (2) times a day. 16 g 6   ??? ibuprofen (ADVIL,MOTRIN) 200 MG tablet Take 600 mg by mouth as needed in the morning for pain.     ??? lancets Misc Dispense 100 lancets, ok to sub any brand preferred by insurance/patient, use 3x/day 100 each 12   ??? levothyroxine (SYNTHROID) 125 MCG tablet Take 2 tablets (250 mcg total) every Sunday (ie 1 day per week). Take 3 tablets (375 mcg total) on Monday-Saturday (ie 6 days per week). (Patient taking differently: Take 375 mcg by mouth daily.) 270 tablet 3   ???  memantine (NAMENDA) 10 MG tablet Take 10 mg by mouth daily.     ??? memantine (NAMENDA) 10 MG tablet TAKE 1 TABLET BY MOUTH TWO TIMES A DAY. 180 tablet 4   ??? mirtazapine (REMERON) 7.5 MG tablet Take 1 tablet (7.5 mg total) by mouth nightly. 90 tablet 0   ??? ofloxacin (OCUFLOX) 0.3 % ophthalmic solution Instill 4 drops in left ear twice daily for 5 days 5 mL 3   ??? ondansetron (ZOFRAN) 8 MG tablet Take 1 tablet by mouth Take as directed.     ??? sulfaSALAzine (AZULFIDINE) 500 mg tablet Take 2 tablets (1000mg ) by mouth twice daily for 7 days, then take 4 tablets (2000mg ) twice daily thereafter. 240 tablet 11   ??? folic acid (FOLVITE) 1 MG tablet TAKE 1 TABLET BY MOUTH EVERY DAY 90 tablet 3     No current facility-administered medications for this visit.       REVIEW OF SYSTEMS  A complete review of systems was obtained and was negative except for those symptoms listed in the HPI     PHYSICAL EXAM  Vitals:    10/04/20 1525   BP: 136/90   Pulse: 87   Resp: 16   Temp: 36.9 ??C (98.4 ??F)   SpO2: 99%       GEN: Awake and alert, pleasant appearing male in no acute distress  LUNGS: normal WOB, CTAB  CV: NAD, S1S2  ABD; obese, soft NTND  SKIN: No rashes, petechiae or jaundice noted.  All melanoma scarlines examined; no nodularity.    PYSCH: Alert and oriented to person, place and time  EXT: No edema noted of the lower extremity noted.          LABS  Lab Results   Component Value Date    WBC 7.2 10/04/2020    HGB 12.5 (L) 10/04/2020    HCT 38.5 (L) 10/04/2020    PLT 237 10/04/2020       Lab Results   Component Value Date    NA 139 10/04/2020    K 3.9 10/04/2020    CL 107 10/04/2020    CO2 25.0 10/04/2020    BUN 17 10/04/2020    CREATININE 0.94 10/04/2020    GLU 100 10/04/2020    CALCIUM 8.4 (L) 10/04/2020    MG 1.8 02/01/2020    PHOS 5.8 (H) 05/10/2020       Lab Results   Component Value Date    BILITOT 0.3 10/04/2020    BILIDIR 0.20 01/27/2020    PROT 7.1 10/04/2020    ALBUMIN 4.1 10/04/2020    ALT 19 10/04/2020    AST 25 10/04/2020    ALKPHOS 64 10/04/2020    GGT 42 08/22/2011       Lab Results   Component Value Date    INR 1.12 01/24/2020       RADIOLOGY RESULTS;  CT chest 10/04/20:  IMPRESSION:  ??  *No thoracic metastatic disease.    Brain MRI 10/04/20:    IMPRESSION:  ??  1. Similar size and distribution of innumerable cerebral and cerebellar micrometastases.      Abd MRI 10/04/20;    IMPRESSION:  --Overall, stable disease when compared to 06/21/2020 with subcapsular implants and T1 hyperintense lesions in the liver. Scattered prominent lymph nodes remain nonspecific.    Echo 04/29/19:  Summary    1. The left ventricle is normal in size with normal wall thickness.    2. The left ventricular systolic function is normal, LVEF  is visually  estimated at 55%.    3. The right ventricle is upper normal in size, with normal systolic  function.          PATHOLOGY:  Pelvic LN 01/09/18:  Final Diagnosis   A: Colon, transverse, biopsy  - Moderately-severely active chronic colitis  - No CMV viral cytopathic effect, granuloma, or dysplasia identified  - No metastatic melanoma or papillary thyroid cancer identified  - See comment  ??  B: Lymph node, pelvic, core needle biopsy  - Metastatic melanoma  - See comment  ??     FNA R femur 07/19/14:  : Bone, right femur, core biopsy with touch imprints  - No primary or metastatic malignancy identified  - Bone with focal osteocyte dropout, suggestive of necrosis  - Bone marrow with trilineage hematopoiesis and focal effacement by chronic inflammation (see comment)    B: Bone, right femur, fine needle aspiration  - Few atypical cells present on direct smears (see comment)  - Scant fragments of edematous stromal tissue in cell block  - Bone marrow with trilineage hematopoiesis    Diagnosis:  Lymph nodes, right cervical, levels 2-4, removal and partial parotidectomy  -Metastatic melanoma involving 1 out of 20 lymph nodes, with the largest  diameter measuring 10 mm (1.0 cm) and no evidence of extracapsular extension  (1/20)  -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

## 2020-10-07 MED ORDER — FOLIC ACID 1 MG TABLET
ORAL_TABLET | 3 refills | 0 days
Start: 2020-10-07 — End: ?

## 2020-10-09 MED ORDER — FOLIC ACID 1 MG TABLET
ORAL_TABLET | 3 refills | 0 days | Status: CP
Start: 2020-10-09 — End: ?

## 2020-10-11 NOTE — Unmapped (Signed)
error 

## 2020-10-16 NOTE — Unmapped (Signed)
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  I confirmed that Kenneth Fitzgerald has metastatic melanoma w/treated brain mets.  Brain MRI stable, but he is in surveillance, not felt to be cured.  APP informed me that the regulations do not allow for licensure in patients w/CNS tumors unless they are cured.  I then followed up w/Kenneth Fitzgerald and relayed my conversation w/the APP.  He expressed understanding and intends to seek a 2nd opinion.  All questions answered, we confirmed he has ongoing f/u w/rad/onc, [facility], ENT and I will see him back in December w/scans.

## 2020-10-17 ENCOUNTER — Ambulatory Visit
Admit: 2020-10-17 | Discharge: 2020-10-18 | Payer: PRIVATE HEALTH INSURANCE | Attending: Radiation Oncology | Primary: Radiation Oncology

## 2020-10-17 DIAGNOSIS — C439 Malignant melanoma of skin, unspecified: Principal | ICD-10-CM

## 2020-10-17 NOTE — Unmapped (Signed)
Kenneth Fitzgerald [facility] 's [name] and Braftovi shipment will be delayed as a result of no refills remain on the prescription.      I have reached out to the patient  at [phone] via text and communicated the delay. We will call the patient back to reschedule the delivery upon resolution. We have not confirmed the new delivery date.

## 2020-10-17 NOTE — Unmapped (Signed)
Radiation Oncology Televisit Note    Patient Name: Kenneth Fitzgerald [facility]  Patient Age: 45 y.o.  Encounter Date: 10/17/2020    Referring Physician:   [name], MD  [address]  Medicine  [id number] Physician Office Building  [place],  [place] [address]    Primary Care Provider:  Ralene Cork, DO    Diagnoses:  1. Malignant neoplasm of brain, unspecified location (CMS-HCC)        Treatment Site: whole brain to 30Gy completed 01/20/20  Right neck to 48 Gy completed 07/09/13    Interval Since Completion of Treatment: 9 months since WBRT      Assessment: Kenneth Fitzgerald is a 45 y.o. 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    He originally had a Stage III scalp melanoma in 2012, pT2pN2, s/p WLE and 3/3 positive SLN's, completion right level 2 neck dissection on 11/06/10 (0/25), one year of interferon completed 01/2012, and then right neck dissection 03/29/13 showing 1+/20 nodes (1cm with no ECE).     He also has multiply recurrent thyroid cancer in the same right neck dissection and is s/p RAI on 05/21/13.  Completed post-op EBRT to right neck to 48 Gy 07/09/13.    He has had stable extracranial disease on dabrafenib/trametinib and was noted to have multiple punctate lesions (best seen on T1 noncontrast sequence) in the brain suspicious for metastasis that have been increasing in number since January 2020.  He completed WBRT to 30 Gy on 01/20/20.    At the end of his course of WBRT he developed a SBO and required surgery.  He has recovered pretty well from that.    10/04/20 MRI demonstrates intracranial control.  CT chest was NED.  MRI abd/pelvis stable.       Plan:     -Disease Status: intracranial control, NED chest, stable abd/pelv on scans 10/04/20     -Care Plan: systemic therapy per Dr Nedra Hai (continue Braftovi/[name])     -Symptoms management: follow up with Dr Bevelyn Ngo in ENT for persistent L ear fullness    -Continue memantine for now     -Follow-up: 3 months with MRI brain on 01/03/21 (ordered by Dr. Nedra Hai)      Interval History:  Kenneth Fitzgerald returns today for a regularly scheduled follow-up.     --Interval History:   Overall feeling well.    Had a syncopal episode mid-September but may have been vasovagal, none since.    Occasional memory difficulties.    Left ear fullness and hearing decrease, s/p 2 myringotomies and has discussed tube with ENT.    Occasional vision difficulties seeing distance.    Working close to 50 hrs/week.  Energy ok during the day but crashes in the evening.    Weight back to 300 lb.    Works with Dr Electa Sniff and Dr Willaim Bane, has had good experiences with [employer].      Review of Systems: All other systems reviewed are negative. Pertinent positives and negatives are above in interval history.    Past Medical, Surgical, Family and Social Histories reviewed and updated in the electronic medical record.    Oncology History   Malignant melanoma, metastatic (CMS-HCC)   03/17/2013 Initial Diagnosis    Melanoma (CMS-HCC)     02/12/2018 - 03/12/2018 Chemotherapy    OP NIVOLUMAB 480 MG Q4W  nivolumab 480 mg every 28 days     Malignant neoplasm of brain, unspecified location (CMS-HCC)  12/22/2019 Initial Diagnosis    Malignant neoplasm of brain, unspecified location (CMS-HCC)     12/22/2019 -  Radiation    Radiation Therapy Treatment Details (Noted on 12/22/2019)  Site: Brain  Technique: IMRT  Goal: No goal specified  Planned Treatment Start Date: No planned start date specified           Physical Exam:  Limited d/t televisit  There were no vitals filed for this visit.  ECOG: 90,  Able to carry on normal activity; minor signs or symptoms of disease (ECOG equivalent 0)  General/Constitutional: Well-appearing, NAD   Skin: No suspicious lesions or rashes  Pulmonary: No respiratory distress or increased work of breathing   Neurologic: Alert and oriented to conversation.  CN II-XII grossly intact.    Psychiatric: Appropriate affect and judgement        Radiology  CT Chest W Contrast    Result Date: 10/04/2020  EXAM: CT CHEST W CONTRAST DATE: 10/04/2020 12:14 PM ACCESSION: [id number] UN DICTATED: 10/04/2020 12:17 PM INTERPRETATION LOCATION: [facility]     CLINICAL INDICATION: 45 years old Male with melanoma  - C43.9 - Malignant melanoma, unspecified site (CMS - HCC)      TECHNIQUE: Contiguous axial images were reconstructed through the chest following a single breath hold helical acquisition during the administration of intravenous contrast material.  Images were reformatted in the axial and sagittal planes. MIP slabs were also constructed.         COMPARISON: CT chest dated 21 June 2020.     FINDINGS:     LUNGS AND AIRWAYS: The lungs are clear. The central airways are patent.     PLEURA AND DIAPHRAGM: No pleural effusion. No pneumothorax. Normal diaphragm.     MEDIASTINUM AND LYMPH NODES: No enlarged intrathoracic lymph nodes. Esophagus is normal.     HEART AND PERICARDIUM: Heart is normal in size. Moderate coronary artery calcifications. No pericardial effusion.     VASCULATURE: Aorta is normal in caliber. Main pulmonary artery is normal in size.      BONES AND CHEST WALL: Mild-to-moderate degenerative changes of the thoracic spine. Mild gynecomastia bilaterally. Stable 2.6 cm subcutaneous low-density lesion of the mid back at midline (2:50) which probably represents a sebaceous cyst.     UPPER ABDOMEN: Imaged upper abdomen is normal.     OTHER: No actionable thyroid nodule.     MEDICAL DEVICES: None.                 *No thoracic metastatic disease.         MRI Brain W Wo Contrast    Result Date: 10/04/2020  EXAM: Magnetic resonance imaging, brain, without and with contrast material. DATE: 10/04/2020 2:16 PM ACCESSION: [id number] UN DICTATED: 10/04/2020 2:41 PM INTERPRETATION LOCATION: Main [facility]     CLINICAL INDICATION: 45 years old Male with melanoma  - C43.9 - Malignant melanoma, unspecified site (CMS - HCC)      COMPARISON: MRI brain 06/21/2020     TECHNIQUE: Multiplanar, multisequence MR imaging of the brain was performed without and with I.V. contrast.     FINDINGS:  Similar size and number of innumerable punctate enhancing lesions throughout the bilateral cerebral and cerebellar hemispheres again consistent with micrometastatic disease. There is mild periventricular and centrum semiovale white matter T2 FLAIR hyperintensity consistent with chronic microangiopathic change. No additional focal parenchymal signal abnormality. Ventricles are normal in size. There is no midline shift. No extra-axial fluid collection. Similar appearance of scattered foci of susceptibility blooming artifact associated  with several metastases consistent with petechial hemosiderin staining. No acute intracranial blood products. No diffusion weighted signal abnormality to suggest acute infarct. There is left maxillary mucosal thickening and complete opacification. Right maxillary mucous retention cyst is present. The paranasal sinuses are otherwise clear. There is a large left mastoid air cell effusion.             1. Similar size and distribution of innumerable cerebral and cerebellar micrometastases.    MRI Abdomen Pelvis W Wo Contrast    Result Date: 10/04/2020  EXAM: MRI abdomen and pelvis with and without contrast DATE: 10/04/2020 2:16 PM ACCESSION: [id number] UN DICTATED: 10/04/2020 2:29 PM INTERPRETATION LOCATION: [facility]     CLINICAL INDICATION: 45 years old Male with melanoma  - C43.9 - Malignant melanoma, unspecified site (CMS - HCC)      COMPARISON: MRI abdomen/pelvis dated 06/21/2020     TECHNIQUE: MRI of the abdomen and pelvis was obtained with and without IV contrast. Multisequence, multiplanar images were obtained.   CONTRAST: 20mL of  Multihance     FINDINGS:     LOWER CHEST: Please refer to dedicated same day CT chest for further evaluation/characterization of structures above the diaphragm.     ABDOMEN/PELVIS:     HEPATOBILIARY: Multiple redemonstrated intrinsically T1 hyperintense and enhancing foci in hepatic segment VIII/V measuring up to 1.3 cm (7:49). There is similar capsular retraction. There were no definite T2 correlates for these areas. No biliary ductal dilatation. Gallbladder is present, partially decompressed, and lobulated.     PANCREAS: Pancreatic divisum. No main pancreatic ductal dilatation. SPLEEN: Splenomegaly, measuring up to 13.7 cm in craniocaudal dimension.     ADRENAL GLANDS: Unremarkable. KIDNEYS/URETERS: Kidneys enhance symmetrically. No hydronephrosis.     BLADDER: Unremarkable. BOWEL: No bowel obstruction. No acute inflammatory process. The appendix unremarkable. Colonic diverticulosis without evidence of diverticulitis.     PERITONEUM/RETROPERITONEUM: Redemonstrated perihepatic/hepatic capsular implants which demonstrate restricted diffusion are again noted measuring 2.2 and 1.9 cm (7:60), previously 2.4 and 2.1 cm. Additional, previously described peritoneal implants are not well delineated, although may be obscured by motion artifact. There is a suspected implants versus inferior epigastric lymph node seen on series 46 image 19, for example.     VASCULATURE: Abdominal aorta within normal limits for patient's age. The anterior branch of the right portal vein is once again nonenhancing and likely chronically occluded. The remainder of the portal venous system is patent. Unremarkable inferior vena cava.     LYMPH NODES: Multiple mildly prominent left external iliac lymph nodes, similar to prior. Unchanged, borderline enlarged 1.5 cm left inguinal lymph node (14:72).     REPRODUCTIVE ORGANS: Mildly enlarged heterogeneous prostate measuring up to 5.4 cm transversely (14:59).     BONES/SOFT TISSUES: No aggressive osseous lesions. No enhancing osseous lesion identified. Postsurgical changes of the anterior abdominal wall.         --Overall, stable disease when compared to 06/21/2020 with subcapsular implants and T1 hyperintense lesions in the liver. Scattered prominent lymph nodes remain nonspecific.                Mina Marble, MD, PhD  Assistant Professor  Department of Radiation Oncology  [facility]  [id number]  [place], [place] [address]  O: [phone]  P: [phone]

## 2020-10-18 MED ORDER — SULFASALAZINE 500 MG TABLET
ORAL_TABLET | Freq: Two times a day (BID) | ORAL | 5 refills | 30 days | Status: CP
Start: 2020-10-18 — End: ?

## 2020-10-18 NOTE — Unmapped (Signed)
Patient called in and we have confirmed the new delivery date as 10/11 via [employer].

## 2020-10-18 NOTE — Unmapped (Signed)
Labs/appt up to date, sulfasalazine refills authorized.

## 2020-10-23 ENCOUNTER — Telehealth: Admit: 2020-10-23 | Discharge: 2020-10-24 | Payer: PRIVATE HEALTH INSURANCE | Attending: Clinical | Primary: Clinical

## 2020-10-23 DIAGNOSIS — F330 Major depressive disorder, recurrent, mild: Principal | ICD-10-CM

## 2020-10-23 DIAGNOSIS — C439 Malignant melanoma of skin, unspecified: Principal | ICD-10-CM

## 2020-10-23 MED ORDER — BINIMETINIB 15 MG TABLET
ORAL_TABLET | Freq: Two times a day (BID) | ORAL | 11 refills | 30 days | Status: CP
Start: 2020-10-23 — End: ?
  Filled 2020-10-23: qty 180, 30d supply, fill #0

## 2020-10-23 MED ORDER — ENCORAFENIB 75 MG CAPSULE
ORAL_CAPSULE | Freq: Every day | ORAL | 11 refills | 30 days | Status: CP
Start: 2020-10-23 — End: ?
  Filled 2020-10-23: qty 180, 30d supply, fill #0

## 2020-10-23 NOTE — Unmapped (Signed)
[facility]  Psychiatry  Psychotherapy Note - Telehealth via??Video  ??  Service Date: October 24,??2022  Service:??50??minutes of??psychotherapy via??video??session  Time with Patient:??50??minutes  ??  Encounter Description:??This encounter was conducted from provider's office via EPIC video session due to COVID-19 pandemic. Kenneth Fitzgerald was located in his home. Session was conducted via EPIC.????Rationale for??video session??is need to social distance. See Plan for telemedicine consent/disclaimer.   ??  Encounter Description/Consent:??  Kenneth Fitzgerald visit was completed through telehealth encounter (video).   ??  This patient encounter is appropriate and reasonable under the circumstances. The patient has been advised of the potential risks and limitations of this mode of treatment (including, but not limited to, the absence of in-person examination) and has agreed to be treated in a remote fashion in spite of them. Any and all of the patient's/patient's family's questions on this issue have been answered.   ??  The patient was physically located in [place] in which I am permitted to provide care. The patient??understood that??he may incur co-pays and cost sharing, and agreed to the telemedicine visit. The visit was??reasonable and appropriate under the circumstances given the patient's presentation at the time.   ??  Time Spent: 50??minutes  ??  Assessment:  Kenneth Fitzgerald a 45yo??male??with metastatic melanoma, h/o thyroid cancer, recent portal vein thrombosis, who I see in therapy to address depression and enhance coping with??multiple??stressors.????We met today via telehealth. ??Kenneth Fitzgerald??reported no significant psychosocial changes as he continues to experience??anxiety and poor mood related to??ongoing legal issues and medical stressors.  We spent time today processing a traumatic event from his past and how it may impact him today  I offered psychotherapy, support,??and recommendations??for tolerating and??coping with distress. ??Will see next in twoweeks.  ??  Risk Assessment:  A suicide and violence risk assessment was performed as part of this evaluation. There patient is deemed to be at chronic elevated risk??for self-harm/suicide given the following factors: current diagnosis of depression, suicidal ideation or threats without a plan and past diagnosis of depression. There patient is deemed to be at chronic elevated risk??for violence given the following factors: male gender. These risk factors are mitigated by the following factors: lack of active SI/HI. There is no acute risk for suicide or violence at this time. The patient was educated about relevant modifiable risk factors including following recommendations for treatment of psychiatric illness and abstaining from substance abuse.??While future psychiatric events cannot be accurately predicted, the patient does not currently require acute inpatient psychiatric care and does not currently meet [facility] involuntary commitment criteria.??????????????????  ??  Plan:  1.??I will continue to see pt for psychotherapy. ??Next session??is??scheduled??to take place on 10/24.  2. Pt??has??information on available CCSP resources.??Pt is working with [name], [name]. ??I have??previously??reached out to [name] to explore financial resources.  3.??Pt??has been followed previously by Dr. Lawernce Keas of Psychiatry.  4. Pt has??my contact information and knows??to reach me as needed.  ??  Subjective:??  Kenneth Fitzgerald??presented as alert and oriented.??He affect was appropriate and he was engaged during??the session as usual.  ??  Kenneth Fitzgerald reported no significant changes in his psychosocial functioning in the time since we last met.  In this respect, he continues to have some poor mood and rumination over the financial, social and self-esteem ramifications of his legal situation that continues to hang over his head.  That he has little control over how this unfolds remains understandably challenging.  Related to this, we processed in more depth than  before an event from several years ago that found understandably, how it affected him afterwards, and how it continues to impact him today. Kenneth Fitzgerald does not share these thoughts and feelings with others easily, and my sense was that it was beneficial to do so today.  ??  I provided Kenneth Fitzgerald support and understanding, and offered him with a forum to process his thoughts and feelings.??My sense is that the therapy remains important for him as he tolerates and address stressors in his life.????We??reviewed and??problem-solved steps that he can take - as well as ways to tolerate distressing aspects of a situation that he has very little power to change at this time.??Kenneth Fitzgerald was??engaged during our session as usual. ??We will meet next in two weeks.  ??  Mental Status Exam:  Appearance:  ?? ??Appears stated age   Motor: ?? ??No abnormal movements   Speech/Language:  ?? ??Normal rate, volume, tone, fluency   Mood: ?? ??eh, okay for the most part   Affect: ?? ??engaged   Thought process: ?? ??Logical, linear, clear, coherent, goal-directed   Thought content: ?? ?? ??Denies SI/HI or thought of self-harm   Perceptual disturbances: ?? ?? ??Behavior not concerning for response to internal stimuli  ??   Orientation: ?? ??Oriented to person, place, time, and general circumstances   Attention: ?? ??Able to fully attend without fluctuations in consciousness   Concentration: ?? ??Able to fully concentrate and attend   Memory: ?? ??Immediate, short-term, long-term, and recall grossly intact   ??Fund of knowledge:  ?? ??Consistent with level of education and development   Insight: ?? ?? ??Intact   Judgment:  ?? ??Intact   Impulse Control: ?? ??Intact   ??  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  [name]. Courtland Coppa PhD  October 10,??2022

## 2020-11-01 NOTE — Unmapped (Signed)
Encounter addended by: Verneda Skill, RN on: 11/01/2020 4:09 PM   Actions taken: Charge Capture section accepted

## 2020-11-06 ENCOUNTER — Telehealth: Admit: 2020-11-06 | Discharge: 2020-11-07 | Payer: PRIVATE HEALTH INSURANCE | Attending: Clinical | Primary: Clinical

## 2020-11-07 NOTE — Unmapped (Signed)
[facility]  Psychiatry  Psychotherapy Note - Telehealth via??Video  ??  Service Date: October 24,??2022  Service:??50??minutes of??psychotherapy via??video??session  Time with Patient:??50??minutes  ??  Encounter Description:??This encounter was conducted from provider's office via EPIC video session due to COVID-19 pandemic. Payden Docter was located in his home. Session was conducted via EPIC.????Rationale for??video session??is need to social distance. See Plan for telemedicine consent/disclaimer.   ??  Encounter Description/Consent:??  [name] visit was completed through telehealth encounter (video).   ??  This patient encounter is appropriate and reasonable under the circumstances. The patient has been advised of the potential risks and limitations of this mode of treatment (including, but not limited to, the absence of in-person examination) and has agreed to be treated in a remote fashion in spite of them. Any and all of the patient's/patient's family's questions on this issue have been answered.   ??  The patient was physically located in [place] in which I am permitted to provide care. The patient??understood that??he may incur co-pays and cost sharing, and agreed to the telemedicine visit. The visit was??reasonable and appropriate under the circumstances given the patient's presentation at the time.   ??  Time Spent: 50??minutes  ??  Assessment:  Mr. Pickrell a 45yo??male??with metastatic melanoma, h/o thyroid cancer, recent portal vein thrombosis, who I see in therapy to address depression and enhance coping with??multiple??stressors. I met with Mr. Heard today via telehealth (video). ??Mr. Sentell??continues to experience??anxiety and some poor mood related to ongoing??legal issues and medical stressors.  No urgent psychosocial concerns noted.  I offered psychotherapy, support,??and recommendations??for tolerating and??coping with distress. ??Will see next??in ~ three weeks.  ??  Risk Assessment:  A suicide and violence risk assessment was performed as part of this evaluation. There patient is deemed to be at chronic elevated risk??for self-harm/suicide given the following factors: current diagnosis of depression, suicidal ideation or threats without a plan and past diagnosis of depression. There patient is deemed to be at chronic elevated risk??for violence given the following factors: male gender. These risk factors are mitigated by the following factors: lack of active SI/HI. There is no acute risk for suicide or violence at this time. The patient was educated about relevant modifiable risk factors including following recommendations for treatment of psychiatric illness and abstaining from substance abuse.??While future psychiatric events cannot be accurately predicted, the patient does not currently require acute inpatient psychiatric care and does not currently meet [facility] involuntary commitment criteria.??????????????????  ??  Plan:  1.??I will continue to see pt for psychotherapy. ??Next session??is??scheduled??to take place on 11/14.  2. Pt??has??information on available CCSP resources.??Pt is working with [name], [name]. ??I have??previously??reached out to [name] to explore financial resources.  3.??Pt??has been followed previously by Dr. Lawernce Keas of Psychiatry.  4. Pt has??my contact information and knows??to reach me as needed.    Subjective:??  Mr. Crotteau??presented as alert, oriented, and engaged throughout the session.    Mr. Pavon reported no significant or meaningful changes in his psychosocial functioning in the two weeks since our last session. He continues to experience some poorer mood and ruminates on the financial, social and self-esteem ramifications of his legal situation.  Much more than medically-related issues, the legal situation is causing Mr. Landini the most distress at this time.  There is very little that he can do to control the next steps the case takes, which is something he appreciates and understandably finds difficult to cope with.  While he  does continue to feel supported by those close to him, he is conscious of the possible ramifications if the case again goes public.  ??  I provided Mr. Berberian support and understanding, and offered him with a forum to process his thoughts and feelings.??My sense remains that the therapeutic arena is important for him.????We??reviewed and??problem-solved steps that he can take to tolerate the distressing aspects of a situation that he has very little control over but affects him greatly.  We drew some parallels to his health situation in this sense.  Mr. Duell was??engaged during our session. ??We will meet next in three weeks.  ??  Mental Status Exam:  Appearance:  ?? ??Appears stated age   Motor: ?? ??No abnormal movements   Speech/Language:  ?? ??Normal rate, volume, tone, fluency   Mood: ?? ??eh, about the same   Affect: ?? ??engaged   Thought process: ?? ??Logical, linear, clear, coherent, goal-directed   Thought content: ?? ?? ??Denies SI/HI or thought of self-harm   Perceptual disturbances: ?? ?? ??Behavior not concerning for response to internal stimuli  ??   Orientation: ?? ??Oriented to person, place, time, and general circumstances   Attention: ?? ??Able to fully attend without fluctuations in consciousness   Concentration: ?? ??Able to fully concentrate and attend   Memory: ?? ??Immediate, short-term, long-term, and recall grossly intact   ??Fund of knowledge:  ?? ??Consistent with level of education and development   Insight: ?? ?? ??Intact   Judgment:  ?? ??Intact   Impulse Control: ?? ??Intact   ??  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  [name]. Kayston Jodoin PhD  October 25,??2022

## 2020-11-08 NOTE — Unmapped (Signed)
[facility] Refill Coordination Note    Specialty Medication(s) to be Shipped:   Hematology/Oncology: [name] and [name]    Other medication(s) to be shipped: No additional medications requested for fill at this time     Kenneth Fitzgerald, DOB: 27-Aug-1975  Phone: [phone] (home)       All above HIPAA information was verified with patient.     Was a Nurse, learning disability used for this call? No    Completed refill call assessment today to schedule patient's medication shipment from the [facility] [phone]).  All relevant notes have been reviewed.     Specialty medication(s) and dose(s) confirmed: Regimen is correct and unchanged.   Changes to medications: Gumecindo reports no changes at this time.  Changes to insurance: No  New side effects reported not previously addressed with a pharmacist or physician: None reported  Questions for the pharmacist: No    Confirmed patient received a Conservation officer, historic buildings and a Surveyor, mining with first shipment. The patient will receive a drug information handout for each medication shipped and additional FDA Medication Guides as required.       DISEASE/MEDICATION-SPECIFIC INFORMATION        N/A    SPECIALTY MEDICATION ADHERENCE     Medication Adherence    Patient reported X missed doses in the last month: 0  Specialty Medication: [name] 15 mg  Patient is on additional specialty medications: Yes  Additional Specialty Medications: Braftovi 75mg   Patient Reported Additional Medication X Missed Doses in the Last Month: 0  Patient is on more than two specialty medications: No  Informant: patient              Were doses missed due to medication being on hold? No    [name] 15 mg: 14 days of medicine on hand   Braftovi 75 mg: 14 days of medicine on hand        REFERRAL TO PHARMACIST     Referral to the pharmacist: Not needed      [facility]     Shipping address confirmed in Epic.     Delivery Scheduled: Yes, Expected medication delivery date: 11/21/20.     Medication will be delivered via UPS to the prescription address in Epic [place].    [name] M [name]   [facility] Specialty Technician

## 2020-11-14 NOTE — Unmapped (Unsigned)
Kenneth Fitzgerald is a 45 y.o. male who presents in follow up of left ear fullness and diminished hearing in the setting of previous radiation. He underwent myringotomy with Dr. Ralene Ok on 06/08/2020, but fullness returned after a few days. A second myringotomy was performed at his last visit ***     Past Medical History  He  has a past medical history of Cancer (CMS-HCC), Disease of thyroid gland, HL (hearing loss), Hypothyroidism, and Skin cancer.    Past Surgical History  His  has a past surgical history that includes pr removal nodes, neck,cerv cmplt (Left, 12/07/2012); pr removal nodes, neck,cerv mod rad (Right, 03/29/2013); pr lap, ventral hernia repair,reducible (N/A, 04/07/2014); pr repair recurr incis hernia,reduc (N/A, 01/03/2015); pr implant mesh hernia repair/debridement closure (N/A, 01/03/2015); pr repair recurr incis hernia,strang (N/A, 06/21/2016); pr colonoscopy w/biopsy single/multiple (N/A, 01/09/2018); pr sigmoidoscopy,fine needl bx,us guided (N/A, 01/09/2018); pr colonoscopy w/biopsy single/multiple (N/A, 08/24/2019); pr colsc flx with directed submucosal njx any sbst (N/A, 08/24/2019); pr exploratory of abdomen (N/A, 01/24/2020); pr freeing bowel adhesion,enterolysis (N/A, 01/24/2020); pr repair recurr incis hernia,strang (N/A, 01/24/2020); and pr negative pressure wound therapy dme >50 sq cm (N/A, 01/24/2020).    Past Family History  His family history includes Arrhythmia in his mother; Colon cancer in his paternal grandmother; Coronary artery disease in his father; Diabetes in his father; Hypertension in his father; Hyperthyroidism in his mother; Osteoporosis in his mother; Prostate cancer in his father; Squamous cell carcinoma in his mother.    Past Social History  He  reports that he has never smoked. He has never used smokeless tobacco. He reports current alcohol use. He reports that he does not use drugs.    Medications/Allergies  His current medication(s) include:    Current Outpatient Medications:   ??? acetaminophen (TYLENOL) 500 MG tablet, Take 2 tablets (1,000 mg total) by mouth every eight (8) hours as needed for pain., Disp: 30 tablet, Rfl: 0  ???  binimetinib ([name]) 15 mg tablet, Take 3 tablets (45 mg total) by mouth Two (2) times a day., Disp: 180 tablet, Rfl: 11  ???  blood sugar diagnostic Strp, Dispense 100 blood glucose test strips, ok to sub any brand preferred by insurance/patient, use 3x/day; dispense whatever brand matches with meter., Disp: 100 strip, Rfl: 12  ???  blood-glucose meter kit, Use as instructed; dispense 1 meter, whatever is preferred by insurance, Disp: 1 each, Rfl: 1  ???  calcitrioL (ROCALTROL) 0.25 MCG capsule, Take 1 capsule (0.25 mcg total) by mouth daily., Disp: 90 capsule, Rfl: 3  ???  calcium carbonate 650 mg calcium (1,625 mg) tablet, Take 1 tablet by mouth Three (3) times a day with a meal., Disp: , Rfl:   ???  DULoxetine (CYMBALTA) 30 MG capsule, TAKE 1 CAPSULE (30 MG TOTAL) BY MOUTH TWO (2) TIMES A DAY., Disp: 180 capsule, Rfl: 2  ???  encorafenib (BRAFTOVI) 75 mg capsule, Take 6 capsules (450 mg total) by mouth daily., Disp: 180 capsule, Rfl: 11  ???  fexofenadine (ALLEGRA ALLERGY) 180 MG tablet, Take 180 mg by mouth daily as needed (allergies). , Disp: , Rfl:   ???  fluticasone propionate (FLONASE) 50 mcg/actuation nasal spray, 2 sprays into each nostril two (2) times a day., Disp: 16 g, Rfl: 6  ???  folic acid (FOLVITE) 1 MG tablet, TAKE 1 TABLET BY MOUTH EVERY DAY, Disp: 90 tablet, Rfl: 3  ???  ibuprofen (ADVIL,MOTRIN) 200 MG tablet, Take 600 mg by mouth as needed in the morning for  pain., Disp: , Rfl:   ???  lancets Misc, Dispense 100 lancets, ok to sub any brand preferred by insurance/patient, use 3x/day, Disp: 100 each, Rfl: 12  ???  levothyroxine (SYNTHROID) 125 MCG tablet, Take 2 tablets (250 mcg total) every Sunday (ie 1 day per week). Take 3 tablets (375 mcg total) on Monday-Saturday (ie 6 days per week). (Patient taking differently: Take 375 mcg by mouth daily.), Disp: 270 tablet, Rfl: 3  ???  memantine (NAMENDA) 10 MG tablet, Take 10 mg by mouth daily., Disp: , Rfl:   ???  memantine (NAMENDA) 10 MG tablet, TAKE 1 TABLET BY MOUTH TWO TIMES A DAY., Disp: 180 tablet, Rfl: 4  ???  mirtazapine (REMERON) 7.5 MG tablet, Take 1 tablet (7.5 mg total) by mouth nightly., Disp: 90 tablet, Rfl: 0  ???  ofloxacin (OCUFLOX) 0.3 % ophthalmic solution, Instill 4 drops in left ear twice daily for 5 days, Disp: 5 mL, Rfl: 3  ???  ondansetron (ZOFRAN) 8 MG tablet, Take 1 tablet by mouth Take as directed., Disp: , Rfl:   ???  sulfaSALAzine (AZULFIDINE) 500 mg tablet, Take 2 tablets (1,000 mg total) by mouth two (2) times a day., Disp: 120 tablet, Rfl: 5  Allergies: Compazine [prochlorperazine], Coconut, and Multihance [gadobenate dimeglumine],    Review of systems   Review of systems was reviewed on attached notes/patient intake forms.      Physical Examination  There were no vitals taken for this visit.    General: well appearing, stated age, no distress   Head - atraumatic, normocephalic   Nose: dorsum midline, no rhinorrhea  Neck: symmetric, trachea midline  Psychiatric: alert and oriented, appropriate mood and affect   Respiratory: no audible wheezing or stridor, normal work of breathing  Neurologic - cranial nerves 2-12 grossly intact  Facial Strength - HB 1/6 bilaterally    Ears - External ear- normal, no lesions, no malformations   Otoscopy - ***    Procedures  ***    Audiogram  The audiogram from 06/08/2020 was reviewed. This demonstrates normal right sided hearing and left sided normal sloping to moderate conductive hearing loss.       Tympanogram  Type A on the right; Type B on the left.     Imaging  None.    I reviewed medical records scanned into media, pertinent findings are summarized in the HPI.    Assessment and Plan  The patient is a 45 y.o. male with left ear fullness and diminished hearing in the setting of previous radiation. ***    The patient/family voiced understanding of the plan as detailed above and is in agreement. I appreciate the opportunity to participate in his care.    I attest to the above information and documentation. However, this note has been created using voice recognition software and may have errors that were not dictated and not seen in editing.    Cheryl Flash MD  Assistant Professor   Division of Otology/Neurotology  Department of [place], [place], [place]

## 2020-11-15 ENCOUNTER — Ambulatory Visit
Admit: 2020-11-15 | Discharge: 2020-11-16 | Payer: PRIVATE HEALTH INSURANCE | Attending: Otolaryngology | Primary: Otolaryngology

## 2020-11-15 DIAGNOSIS — H9210 Otorrhea, unspecified ear: Principal | ICD-10-CM

## 2020-11-15 MED ORDER — OFLOXACIN 0.3 % EYE DROPS
3 refills | 0 days | Status: CP
Start: 2020-11-15 — End: ?

## 2020-11-15 NOTE — Unmapped (Signed)
Dr. Albertine Patricia Instructions after Myringotomy (Incision in the Ear Drum) and Ear Tube Placement      Myringotomy with or without placement of ear tubes is the most common ear operation. Myringotomy is an incision in the eardrum about 1/16th to 1/8th inch long. Middle ear fluid and infected material can be suctioned through this opening. Ear tubes (tympanostomy tubes, ventilation tubes, pressure equalization tubes) are tiny cylinders, usually made of plastic or metal, that are surgically inserted into the eardrum. An ear tube creates an airway that ventilates the middle ear and prevents the accumulation of fluids behind the eardrum. Most ear tubes fall out within 6 to 12 months, and the holes typically heal shut on their own.        Ear Care After Ear Tube Placement  - You will be prescribed an antibiotic ear drop. This will help decrease drainage from the ear.   - Use Tylenol for mild pain if needed.   - It is normal to have some popping noise in the ear for a few hours.   - Having slight blood tinged drainage is common for a few days after ear tube placement.   - You may hear air going out through your ear - particularly when you blow your noise.   - It is ok to get water into the ear, but may cause pain if water gets into the middle ear space.   - We will continue to monitor your ear until the ear tube falls out.     Who can I contact with questions or concerns?   You saw Eleonore Chiquito, MD for your visit today    For appointments call [phone]    To speak to a nurse call Casimiro Needle  Phone: [phone]    To schedule surgery contact Lillette Boxer:  Phone: [phone]     After hours, please call [phone] and ask for the ENT physician on call.    Main phone tree:  [phone]  Triage M-F: 8:15-4:00pm [phone]  Office fax #:  [phone]    MyChart is an electronic way to contact us that works like email to contact your physician or physician's nurse for non-urgent questions or concerns.

## 2020-11-20 MED FILL — MEKTOVI 15 MG TABLET: ORAL | 30 days supply | Qty: 180 | Fill #1

## 2020-11-20 MED FILL — BRAFTOVI 75 MG CAPSULE: ORAL | 30 days supply | Qty: 180 | Fill #1

## 2020-11-27 ENCOUNTER — Telehealth: Admit: 2020-11-27 | Discharge: 2020-11-28 | Payer: PRIVATE HEALTH INSURANCE | Attending: Clinical | Primary: Clinical

## 2020-11-27 DIAGNOSIS — F3341 Major depressive disorder, recurrent, in partial remission: Principal | ICD-10-CM

## 2020-11-27 DIAGNOSIS — G4700 Insomnia, unspecified: Principal | ICD-10-CM

## 2020-11-27 DIAGNOSIS — F32A Depression, unspecified depression type: Principal | ICD-10-CM

## 2020-11-27 MED ORDER — MIRTAZAPINE 7.5 MG TABLET
ORAL_TABLET | Freq: Every evening | ORAL | 3 refills | 90 days | Status: CP
Start: 2020-11-27 — End: ?

## 2020-11-27 NOTE — Unmapped (Signed)
[facility]  Psychiatry  Psychotherapy Note - Telehealth via??Video  ??  Service Date: November 14,??2022  Service:??50??minutes of??psychotherapy via??video??session  Time with Patient:??50??minutes  ??  Encounter Description:??This encounter was conducted from provider's office via EPIC video session due to COVID-19 pandemic. Kenneth Fitzgerald was located in his home.??Session was conducted via EPIC.????Rationale for??video session??is need to social distance. See Plan for telemedicine consent/disclaimer.   ??  Encounter Description/Consent:??  Kenneth Fitzgerald visit was completed through telehealth encounter (video).   ??  This patient encounter is appropriate and reasonable under the circumstances. The patient has been advised of the potential risks and limitations of this mode of treatment (including, but not limited to, the absence of in-person examination) and has agreed to be treated in a remote fashion in spite of them. Any and all of the patient's/patient's family's questions on this issue have been answered.   ??  The patient was physically located in [place] in which I am permitted to provide care. The patient??understood that??he may incur co-pays and cost sharing, and agreed to the telemedicine visit. The visit was??reasonable and appropriate under the circumstances given the patient's presentation at the time.   ??  Time Spent: 50??minutes  ??  Assessment:  Kenneth Fitzgerald a 45yo??male??with metastatic melanoma, h/o thyroid cancer, recent portal vein thrombosis, who I see in therapy to address depression and enhance coping with??multiple??stressors. I met with Kenneth Fitzgerald today via telehealth (video). ??Kenneth Fitzgerald??reported running out of Remeron 10-14 days ago, and since then has slept more poorly and experienced slightly poorer mood. He noted that this time of year is often hard for him because of past losses and the daylight hours getting shorter.  Medical and legal stressors continue.  I reached out to Dr. Nedra Hai about a Remeron refill (previous prescriber was Marva Panda NP, but pt is no longer with Palliative Care). ??I offered psychotherapy, support,??and recommendations??for tolerating and??coping with distress. ??Will see next??in 2.5 weeks.  ??  Risk Assessment:  A suicide and violence risk assessment was performed as part of this evaluation. There patient is deemed to be at chronic elevated risk??for self-harm/suicide given the following factors: current diagnosis of depression, suicidal ideation or threats without a plan and past diagnosis of depression. There patient is deemed to be at chronic elevated risk??for violence given the following factors: male gender. These risk factors are mitigated by the following factors: lack of active SI/HI. There is no acute risk for suicide or violence at this time. The patient was educated about relevant modifiable risk factors including following recommendations for treatment of psychiatric illness and abstaining from substance abuse.??While future psychiatric events cannot be accurately predicted, the patient does not currently require acute inpatient psychiatric care and does not currently meet [facility] involuntary commitment criteria.??????????????????  ??  Plan:  1.??I will continue to see pt for psychotherapy. ??Next session??is??scheduled??to take place on 12/1.  2. Pt??has??information on available CCSP resources.??Pt is working with [name], [name]. ??I have??previously??reached out to [name] to explore financial resources.  3. I sent message to Dr. Nedra Hai about a Remeron refill (previous prescriber was Marva Panda NP, but pt is no longer with Palliative Care).  4. Pt has??my contact information and knows??to reach me as needed.  ??  Subjective:??  Kenneth Fitzgerald??presented as alert, oriented, and engaged throughout the??session.  ??  Kenneth Fitzgerald??reported that he ran out of Remeron 10-14 days ago and did not have any refills left (prescriber has been Marva Panda NP, but pt is  no longer with Palliative Care service).  Since he has not taken Remeron, Kenneth Fitzgerald reported more difficulty sleeping and experiencing slightly poorer mood.  He also noted that this time of year is often hard for him because of past losses and the daylight hours getting shorter.  The medical, financial, and legal stressors that have been present for some time continues to affect his psychosocial functioning. The legal situation - and his inability to do much about it at this juncture - is causing Kenneth Fitzgerald the most distress at this time.  He feels??supported by those close to him, he is conscious of the possible ramifications if the case again goes public.  Kenneth Fitzgerald has tired to stay behavioral active. He is working ~ 45 hours a week, which he noted is useful distraction.  ??  I provided Kenneth Fitzgerald support and understanding, and??offered??him with??a forum to process his thoughts, feelings, and experiences.??My sense remains that the therapeutic arena is important for him.????We??reviewed and??problem-solved steps that he can take to tolerate the distressing aspects of a situation that he has very little control over but affects him greatly. I sent message to Dr. Nedra Hai about a Remeron refill.  Kenneth Fitzgerald was??engaged during our session. ??We will meet next in 2.5 weeks.  ??  Mental Status Exam:  Appearance:  ?? ??Appears stated age   Motor: ?? ??No abnormal movements   Speech/Language:  ?? ??Normal rate, volume, tone, fluency   Mood: ?? ??not as good   Affect: ?? ??engaged   Thought process: ?? ??Logical, linear, clear, coherent, goal-directed   Thought content: ?? ?? ??Denies SI/HI or thought of self-harm   Perceptual disturbances: ?? ?? ??Behavior not concerning for response to internal stimuli  ??   Orientation: ?? ??Oriented to person, place, time, and general circumstances   Attention: ?? ??Able to fully attend without fluctuations in consciousness   Concentration: ?? ??Able to fully concentrate and attend   Memory: ?? ??Immediate, short-term, long-term, and recall grossly intact   ??Fund of knowledge:  ?? ??Consistent with level of education and development   Insight: ?? ?? ??Intact   Judgment:  ?? ??Intact   Impulse Control: ?? ??Intact   ??  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  [name]. Randilyn Foisy PhD  November 14,??2022

## 2020-11-28 MED ORDER — DULOXETINE 30 MG CAPSULE,DELAYED RELEASE
ORAL_CAPSULE | 1 refills | 0 days
Start: 2020-11-28 — End: ?

## 2020-12-01 NOTE — Unmapped (Signed)
[facility]  Psychiatry  Individualized Treatment Plan of Care  ??  Name: Kenneth Fitzgerald  Date: 11/27/2020  MRN: [id number]  DOB: 21-Sep-1975  PCP: Ozzie Hoyle DRAPER, DO  ??  Service provided: Individual Psychotherapy     Target outcomes: Reduction in symptoms including anxiety and depression, and enhancement of coping with medical and legal stressors     Projected date of outcome achievement (6-12 months): 12 months     Strategies and interventions: Individual supportive and cognitive-behavioral psychotherapy     Estimated date of next review (6 months): 12 months  ??  Individual signature: N/A  ??  Printed name: N/A  ??  Date: 11/27/2020  ??  Guardian signature, if applicable: N/A  ??  Printed name: N/A  ??  Date: N/A  ??  Provider signature: **  ??  Printed name: Fanny Bien, PhD  ??  Date: 11/27/2020  ??  ** This appointment was conducted virtually in light of the ongoing COVID Public Health Emergency. As such, written consent could not be obtained. The patient was actively involved in treatment planning and is in agreement with treatment plan. The patient provided verbal consent to the treatment plan as detailed above.

## 2020-12-06 NOTE — Unmapped (Signed)
[facility] Refill Coordination Note    Specialty Medication(s) to be Shipped:   Hematology/Oncology: [name] and [name]    Other medication(s) to be shipped: No additional medications requested for fill at this time     Kenneth Fitzgerald, DOB: February 26, 1975  Phone: [phone] (home)       All above HIPAA information was verified with patient.     Was a Nurse, learning disability used for this call? No    Completed refill call assessment today to schedule patient's medication shipment from the [facility] [phone]).  All relevant notes have been reviewed.     Specialty medication(s) and dose(s) confirmed: Regimen is correct and unchanged.   Changes to medications: Jahzir reports no changes at this time.  Changes to insurance: No  New side effects reported not previously addressed with a pharmacist or physician: None reported  Questions for the pharmacist: No    Confirmed patient received a Conservation officer, historic buildings and a Surveyor, mining with first shipment. The patient will receive a drug information handout for each medication shipped and additional FDA Medication Guides as required.       DISEASE/MEDICATION-SPECIFIC INFORMATION        N/A    SPECIALTY MEDICATION ADHERENCE     Medication Adherence    Patient reported X missed doses in the last month: 0  Specialty Medication: Braftovi 75 mg  Patient is on additional specialty medications: Yes  Additional Specialty Medications: [name] 15mg   Patient Reported Additional Medication X Missed Doses in the Last Month: 1  Patient is on more than two specialty medications: No  Informant: patient              Were doses missed due to medication being on hold? No    Braftovi 75 mg: 14 days of medicine on hand   [name] 15 mg: 14 days of medicine on hand       REFERRAL TO PHARMACIST     Referral to the pharmacist: Not needed      [facility]     Shipping address confirmed in Epic.     Delivery Scheduled: Yes, Expected medication delivery date: 12/21/20.     Medication will be delivered via UPS to the prescription address in Epic [place].    [name] M [name]   [facility] Specialty Technician

## 2020-12-15 MED ORDER — DULOXETINE 30 MG CAPSULE,DELAYED RELEASE
ORAL_CAPSULE | Freq: Two times a day (BID) | ORAL | 2 refills | 90 days | Status: CP
Start: 2020-12-15 — End: ?

## 2020-12-20 MED FILL — BRAFTOVI 75 MG CAPSULE: ORAL | 30 days supply | Qty: 180 | Fill #2

## 2020-12-20 MED FILL — MEKTOVI 15 MG TABLET: ORAL | 30 days supply | Qty: 180 | Fill #2

## 2020-12-22 ENCOUNTER — Telehealth: Admit: 2020-12-22 | Discharge: 2020-12-23 | Payer: PRIVATE HEALTH INSURANCE | Attending: Clinical | Primary: Clinical

## 2020-12-22 DIAGNOSIS — F411 Generalized anxiety disorder: Principal | ICD-10-CM

## 2020-12-22 DIAGNOSIS — C801 Malignant (primary) neoplasm, unspecified: Principal | ICD-10-CM

## 2020-12-22 DIAGNOSIS — F330 Major depressive disorder, recurrent, mild: Principal | ICD-10-CM

## 2020-12-22 NOTE — Unmapped (Signed)
[facility]  Psychiatry  Psychotherapy Note - Telehealth via??Video  ??  Service Date: December 9,??2022  Service:??50??minutes of??psychotherapy via??video??session  Time with Patient:??50??minutes  ??  Encounter Description:??This encounter was conducted from provider's office via EPIC video session due to COVID-19 pandemic. Kenneth Fitzgerald was located in his home.??Session was conducted via EPIC.????Rationale for??video session??is need to social distance. See Plan for telemedicine consent/disclaimer.   ??  Encounter Description/Consent:??  Kenneth Fitzgerald visit was completed through telehealth encounter (video).   ??  This patient encounter is appropriate and reasonable under the circumstances. The patient has been advised of the potential risks and limitations of this mode of treatment (including, but not limited to, the absence of in-person examination) and has agreed to be treated in a remote fashion in spite of them. Any and all of the patient's/patient's family's questions on this issue have been answered.   ??  The patient was physically located in [place] in which I am permitted to provide care. The patient??understood that??he may incur co-pays and cost sharing, and agreed to the telemedicine visit. The visit was??reasonable and appropriate under the circumstances given the patient's presentation at the time.   ??  Time Spent: 50??minutes  ??  Assessment:  Kenneth Fitzgerald a 45yo??male??with metastatic melanoma, h/o thyroid cancer, recent portal vein thrombosis, who I see in therapy to address depression and enhance coping with??multiple??stressors.??I??met??with Kenneth Fitzgerald??today via telehealth (video).  Kenneth Fitzgerald reported anxiety about his upcoming scans and some poor mood associated with the holiday season, which has been historically difficult for him.  Legal stressors continue with no resolution in sight. ??I offered psychotherapy, support,??and recommendations??for addressing anxiety and mood concerns.  Will see next??in 2.5 weeks.  ??  Risk Assessment:  A suicide and violence risk assessment was performed as part of this evaluation. There patient is deemed to be at chronic elevated risk??for self-harm/suicide given the following factors: current diagnosis of depression, suicidal ideation or threats without a plan and past diagnosis of depression. There patient is deemed to be at chronic elevated risk??for violence given the following factors: male gender. These risk factors are mitigated by the following factors: lack of active SI/HI. There is no acute risk for suicide or violence at this time. The patient was educated about relevant modifiable risk factors including following recommendations for treatment of psychiatric illness and abstaining from substance abuse.??While future psychiatric events cannot be accurately predicted, the patient does not currently require acute inpatient psychiatric care and does not currently meet [facility] involuntary commitment criteria.??????????????????  ??  Plan:  1.??I will continue to see pt for psychotherapy. ??Next session??is??scheduled??to take place on 12/27.  2. Pt??has??information on available [facility] resources.??Pt is working with [name], [name]. ??I have??previously??reached out to [name] to explore financial resources.  3. Dr. Nedra Hai has refilled pt's Remeron.  4. Pt has??my contact information and knows??to reach me as needed.  ??  Subjective:??  Kenneth Fitzgerald??presented as alert,??oriented, and engaged throughout??the??session.  ??  Kenneth Fitzgerald??endorsed high anxiety and poorer mood over the last ~ week. He attributes the anxiety to his upcoming scans scheduled for 12/21. This anxiety has manifested in rumination, what iffing, and often analyzing whether bodily cues (e.g., vision problems) mean that his cancer has returned. Kenneth Fitzgerald stated that his anxiety leading up to these scans is greater than typical for him and has been associated with poorer mood. He attributed this to the time of year in that it gets dark early and that the holidays have  been difficult for him since his parents died.  He denied thoughts of self-harm and endorsed no emergent concerns.  In addition, the legal stressors continue to affect his life in real and meaningful ways.  Unfortunately, there is very little he can do at this point to influence this and there is no resolution in sight. Kenneth Fitzgerald has tired to stay behavioral active. He is planning to spend the holidays with his brother.  ??  I provided Kenneth Fitzgerald support and understanding, and??offered??him with??a forum to process his thoughts, feelings, and experiences.?? We??reviewed and??problem-solved steps that he can take to address anxiety and poor mood - and to tolerate??the??distressing aspects of a situation that he has very little??control over.  Mr.??Fitzgerald was??engaged during our session as usual. ??We will meet next in 2.5 weeks.  ??  Mental Status Exam:  Appearance:  ?? ??Appears stated age   Motor: ?? ??No abnormal movements   Speech/Language:  ?? ??Normal rate, volume, tone, fluency   Mood: ?? up and down   Affect: ?? ??engaged; sw flat   Thought process: ?? ??Logical, linear, clear, coherent, goal-directed   Thought content: ?? ?? ??Denies SI/HI or thought of self-harm   Perceptual disturbances: ?? ?? ??Behavior not concerning for response to internal stimuli  ??   Orientation: ?? ??Oriented to person, place, time, and general circumstances   Attention: ?? ??Able to fully attend without fluctuations in consciousness   Concentration: ?? ??Able to fully concentrate and attend   Memory: ?? ??Immediate, short-term, long-term, and recall grossly intact   ??Fund of knowledge:  ?? ??Consistent with level of education and development   Insight: ?? ?? ??Intact   Judgment:  ?? ??Intact   Impulse Control: ?? ??Intact   ??  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  Kenneth Fitzgerald. Zerline Melchior PhD  December 9,??2022

## 2021-01-03 ENCOUNTER — Ambulatory Visit
Admit: 2021-01-03 | Discharge: 2021-01-03 | Payer: PRIVATE HEALTH INSURANCE | Attending: Hematology & Oncology | Primary: Hematology & Oncology

## 2021-01-03 ENCOUNTER — Ambulatory Visit: Admit: 2021-01-03 | Discharge: 2021-01-04 | Payer: PRIVATE HEALTH INSURANCE

## 2021-01-03 ENCOUNTER — Ambulatory Visit: Admit: 2021-01-03 | Discharge: 2021-01-03 | Payer: PRIVATE HEALTH INSURANCE

## 2021-01-03 ENCOUNTER — Other Ambulatory Visit: Admit: 2021-01-03 | Discharge: 2021-01-03 | Payer: PRIVATE HEALTH INSURANCE

## 2021-01-03 ENCOUNTER — Ambulatory Visit
Admit: 2021-01-03 | Discharge: 2021-01-04 | Payer: PRIVATE HEALTH INSURANCE | Attending: Radiation Oncology | Primary: Radiation Oncology

## 2021-01-03 DIAGNOSIS — C439 Malignant melanoma of skin, unspecified: Principal | ICD-10-CM

## 2021-01-03 DIAGNOSIS — E890 Postprocedural hypothyroidism: Principal | ICD-10-CM

## 2021-01-03 LAB — CBC W/ AUTO DIFF
BASOPHILS ABSOLUTE COUNT: 0.1 10*9/L (ref 0.0–0.1)
BASOPHILS RELATIVE PERCENT: 1.1 %
EOSINOPHILS ABSOLUTE COUNT: 0.3 10*9/L (ref 0.0–0.5)
EOSINOPHILS RELATIVE PERCENT: 4.2 %
HEMATOCRIT: 42.4 % (ref 39.0–48.0)
HEMOGLOBIN: 13.6 g/dL (ref 12.9–16.5)
LYMPHOCYTES ABSOLUTE COUNT: 1.4 10*9/L (ref 1.1–3.6)
LYMPHOCYTES RELATIVE PERCENT: 18.4 %
MEAN CORPUSCULAR HEMOGLOBIN CONC: 32.1 g/dL (ref 32.0–36.0)
MEAN CORPUSCULAR HEMOGLOBIN: 25.4 pg — ABNORMAL LOW (ref 25.9–32.4)
MEAN CORPUSCULAR VOLUME: 79.2 fL (ref 77.6–95.7)
MEAN PLATELET VOLUME: 8.6 fL (ref 6.8–10.7)
MONOCYTES ABSOLUTE COUNT: 0.5 10*9/L (ref 0.3–0.8)
MONOCYTES RELATIVE PERCENT: 7.1 %
NEUTROPHILS ABSOLUTE COUNT: 5.3 10*9/L (ref 1.8–7.8)
NEUTROPHILS RELATIVE PERCENT: 69.2 %
PLATELET COUNT: 260 10*9/L (ref 150–450)
RED BLOOD CELL COUNT: 5.36 10*12/L (ref 4.26–5.60)
RED CELL DISTRIBUTION WIDTH: 18.4 % — ABNORMAL HIGH (ref 12.2–15.2)
WBC ADJUSTED: 7.7 10*9/L (ref 3.6–11.2)

## 2021-01-03 LAB — COMPREHENSIVE METABOLIC PANEL
ALBUMIN: 3.9 g/dL (ref 3.4–5.0)
ALKALINE PHOSPHATASE: 75 U/L (ref 46–116)
ALT (SGPT): 40 U/L (ref 10–49)
ANION GAP: 10 mmol/L (ref 5–14)
AST (SGOT): 32 U/L (ref <=34)
BILIRUBIN TOTAL: 0.3 mg/dL (ref 0.3–1.2)
BLOOD UREA NITROGEN: 15 mg/dL (ref 9–23)
BUN / CREAT RATIO: 14
CALCIUM: 8.4 mg/dL — ABNORMAL LOW (ref 8.7–10.4)
CHLORIDE: 104 mmol/L (ref 98–107)
CO2: 28 mmol/L (ref 20.0–31.0)
CREATININE: 1.05 mg/dL
EGFR CKD-EPI (2021) MALE: 89 mL/min/{1.73_m2} (ref >=60)
GLUCOSE RANDOM: 87 mg/dL (ref 70–179)
POTASSIUM: 4.3 mmol/L (ref 3.4–4.8)
PROTEIN TOTAL: 7.2 g/dL (ref 5.7–8.2)
SODIUM: 142 mmol/L (ref 135–145)

## 2021-01-03 LAB — SLIDE REVIEW

## 2021-01-03 LAB — TSH: THYROID STIMULATING HORMONE: 3.926 u[IU]/mL (ref 0.550–4.780)

## 2021-01-03 LAB — T4, FREE: FREE T4: 1 ng/dL (ref 0.89–1.76)

## 2021-01-03 MED ADMIN — gadobenate dimeglumine (MULTIHANCE) 529 mg/mL (0.1mmol/0.2mL) solution 20 mL: 20 mL | INTRAVENOUS | @ 16:00:00 | Stop: 2021-01-03

## 2021-01-03 MED ADMIN — iohexoL (OMNIPAQUE) 350 mg iodine/mL solution 75 mL: 75 mL | INTRAVENOUS | @ 16:00:00 | Stop: 2021-01-03

## 2021-01-03 NOTE — Unmapped (Signed)
Radiation Oncology Follow Up Visit Note    Patient Name: Kenneth Fitzgerald [facility]  Patient Age: 45 y.o.  Encounter Date: 01/03/2021    Referring Physician:   [name], MD  [address]  [id number] [facility] [address]  [place],  [place] [address]    Primary Care Provider:  Ralene Cork, DO    Diagnoses:  1. Malignant neoplasm of brain, unspecified location (CMS-HCC)        Treatment Site: whole brain to 30Gy completed 01/20/20  Right neck to 48 Gy completed 07/09/13    Interval Since Completion of Treatment: 11 months since WBRT      Assessment: Bentlie Fitzgerald is a 45 y.o. 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    He originally had a Stage III scalp melanoma in 2012, pT2pN2, s/p WLE and 3/3 positive SLN's, completion right level 2 neck dissection on 11/06/10 (0/25), one year of interferon completed 01/2012, and then right neck dissection 03/29/13 showing 1+/20 nodes (1cm with no ECE).     He also has multiply recurrent thyroid cancer in the same right neck dissection and is s/p RAI on 05/21/13.  Completed post-op EBRT to right neck to 48 Gy 07/09/13.    He has had stable extracranial disease on dabrafenib/trametinib and was noted to have multiple punctate lesions (best seen on T1 noncontrast sequence) in the brain suspicious for metastasis that have been increasing in number since January 2020.  He completed WBRT to 30 Gy on 01/20/20.    At the end of his course of WBRT he developed a SBO and required surgery.  He has recovered pretty well from that.    Today's MRI demonstrates intracranial control.  MRI abdomen showed unchanged hepatic implants without evidence of new disease. CT chest was NED.  US soft tissue head and neck was NED.        Plan:     -Disease Status: intracranial control, NED chest on scans today; Stable in Ab/Pelv     -Care Plan: systemic therapy dabrafenib/trametinib per Dr Nedra Hai     -Symptoms management: follow up with Dr Ralene Ok in ENT 05/16/20    -Continue memantine for at least 6 months from WBRT, may continue beyond     -Follow-up: 3 months with MRI brain (coordinated with Dr. Nedra Hai)      Interval History:  [name] returns today for a regularly scheduled follow-up, he was last seen in clinic about 2 months ago.     --Interval History:   He completed left ear myringectomy in May 2022 and has had improvement in ear fullness since that time. He endorsed fatigue today after moving furniture and states that in the past he did not feel as tired when doing similar tasks. Otherwise he denied any new symptoms today including nausea, dizziness, headaches, or new weaknesses.    Review of Systems: All other systems reviewed are negative. Pertinent positives and negatives are above in interval history.    Past Medical, Surgical, Family and Social Histories reviewed and updated in the electronic medical record.      Oncology History   Malignant melanoma, metastatic (CMS-HCC)   03/17/2013 Initial Diagnosis    Melanoma (CMS-HCC)     02/12/2018 - 03/12/2018 Chemotherapy    OP NIVOLUMAB 480 MG Q4W  nivolumab 480 mg every 28 days     Malignant neoplasm of brain, unspecified location (CMS-HCC)   12/22/2019 Initial Diagnosis  Malignant neoplasm of brain, unspecified location (CMS-HCC)     12/22/2019 -  Radiation    Radiation Therapy Treatment Details (Noted on 12/22/2019)  Site: Brain  Technique: IMRT  Goal: No goal specified  Planned Treatment Start Date: No planned start date specified           Physical Exam:  There were no vitals filed for this visit.  ECOG: 90,  Able to carry on normal activity; minor signs or symptoms of disease (ECOG equivalent 0)  General/Constitutional: Well-appearing, NAD   HEENT: Normocephalic, atraumatic, no scleral icterus, missing teeth in back of mouth   Skin: No suspicious lesions or rashes  Pulmonary: No respiratory distress or increased work of breathing   Abdominal: Non distended  Musculoskeletal: Full range of motion in the extremities, without edema Neurologic: Alert and oriented to conversation.  CN II-XII grossly intact. 5/5 strength throughout the upper and lower extremities.   Psychiatric: Appropriate affect and judgement        Radiology  CT Chest W Contrast    Result Date: 01/03/2021  EXAM: CT CHEST W CONTRAST DATE: 01/03/2021 10:34 AM ACCESSION: [id number] UN DICTATED: 01/03/2021 12:12 PM INTERPRETATION LOCATION: [facility]     CLINICAL INDICATION: 45 year old male with- C43.9 - Malignant melanoma, unspecified site (CMS - HCC)      COMPARISON: September 21.     TECHNIQUE: Contiguous 0.6 or 1 mm and 2 mm axial images were reconstructed through the chest following a single breath hold helical acquisition during the administration of intravenous contrast material.  Images were reformatted in the axial and sagittal planes.  MIP slabs were also constructed.     FINDINGS:     LUNGS AND AIRWAYS: The lungs are clear.  The central airways are patent.     PLEURA: No pleural fluid or pneumothorax.     MEDIASTINUM AND LYMPH NODES: No enlarged intrathoracic or axillary lymph nodes.  No other mediastinal abnormality.     HEART AND VASCULATURE:The cardiac chambers are normal in size.  There is no pericardial effusion.  Ascending and descending aorta normal in caliber.  Pulmonary artery normal in size.. Coronary calcification.     BONES AND SOFT TISSUES: Stable dermal inclusion cyst in the soft tissues of the midline of the back.     UPPER ABDOMEN: See separately reported MRI abdomen.     OTHER: No other significant findings.                 No intrathoracic metastasis.             MRI Brain W Wo Contrast    Result Date: 01/03/2021  EXAM: Magnetic resonance imaging, brain, without and with contrast material. DATE: 01/03/2021 10:48 AM ACCESSION: [id number] UN DICTATED: 01/03/2021 11:11 AM INTERPRETATION LOCATION: [facility]     CLINICAL INDICATION: 45 years old Male with melanoma  - C43.9 - Malignant melanoma, unspecified site (CMS - HCC)      COMPARISON: MRI brain 10/04/2020     TECHNIQUE: Multiplanar, multisequence MR imaging of the brain was performed without and with I.V. contrast.     FINDINGS:  Similar size and number of innumerable punctate enhancing lesions throughout the bilateral cerebral and cerebellar hemispheres again consistent with micrometastatic disease. There is mild periventricular and centrum semiovale white matter T2 FLAIR hyperintensity consistent with chronic microangiopathic change. No additional focal parenchymal signal abnormality. Ventricles are normal in size. There is no midline shift. No extra-axial fluid collection. Similar appearance of scattered foci of susceptibility blooming  artifact associated with several metastases consistent with petechial hemosiderin staining. No acute intracranial blood products. No diffusion weighted signal abnormality to suggest acute infarct. There is left maxillary mucosal thickening and complete opacification. Right maxillary mucous retention cyst is present. The paranasal sinuses are otherwise clear. There is a large left mastoid air cell effusion.             1. Similar size and distribution of innumerable cerebral and cerebellar micrometastases. No new lesions.             No change from MRI BRAIN W WO CONTRAST dated 10/04/2020 .    MRI Abdomen W Wo Contrast    Result Date: 01/03/2021  EXAM: MRI ABDOMEN W WO CONTRAST DATE: 01/03/2021 10:48 AM ACCESSION: [id number] UN DICTATED: 01/03/2021 10:58 AM INTERPRETATION LOCATION: [facility]         CLINICAL INDICATION: 45 year old male with melanoma  - C43.9 - Malignant melanoma, unspecified site (CMS - HCC)      COMPARISON: Concurrent CT of the chest, 10/04/2020 MRI of the abdomen and pelvis     TECHNIQUE: Multisequence, multiplanar magnetic resonance imaging of the abdomen were performed with and without intravenous contrast.         FINDINGS:     LOWER CHEST:Please see concurrent CT of the chest.     LIVER: No significant fat or iron deposition.  Liver is normal in size and contour. Soft tissue implants along the capsule of segment V measuring 3.2 and 2.4 cm (18:42, 47), unchanged. 1.2 cm enhancing T1 hyperintense lesion in segment V/VIII (12:41), previously 1.3 cm without obvious DWI abnormal signal intensity or obvious abnormal enhancement.     GALLBLADDER/BILIARY: Gallbladder is surgically absent. No intrahepatic biliary ductal dilation.  Common bile duct is normal caliber for age.     SPLEEN: Borderline splenomegaly measuring 13.6 cm in craniocaudal dimension, similar to prior.     PANCREAS: Normal size. No ductal dilation.     ADRENALS: No adrenal mass.     KIDNEYS/URETERS: Normal size. No hydronephrosis.     GI TRACT: No findings of bowel obstruction, although limited by motion.     PERITONEUM/RETROPERITONEUM AND MESENTERY: No significant ascites or drainable collections. No retroperitoneal collections.     LYMPH NODES: No pathologic mesenteric or retroperitoneal adenopathy.     VESSELS: Hepatic and portal veins are patent. Aorta is normal in caliber.     BONES AND SOFT TISSUES: No suspicious enhancing marrow lesions. Postsurgical changes of anterior abdominal wall. There is focal area of increased T2 signal with associated enhancement along the left medial psoas muscle adjacent to L2-L3 intervertebral disc and there is  evidence of linear enhancement along the margin of the intervertebral disc abutting the left psoas muscle (series 18 image 61, series 7 image 33, series 20 image 75).             Unchanged T1 hyperintense lesions within hepatic segment V/VIII and capsular soft tissue implants described above. Otherwise, no new or enlarging sites of metastatic disease within the abdomen.     There is focal area of increased T2 signal with associated enhancement along the left medial psoas muscle adjacent to L2-L3 intervertebral disc and there is  evidence of linear enhancement along the margin of the intervertebral disc abutting the left psoas muscle (series 18 image 61, series 7 image 33, series 20 image 75). This is likely degenerative and enhancement is seen along the left psoas muscle is likely secondary to the inflammation due to bulging of  the intervertebral disc. Attention on follow-up.     For findings above the diaphragm, please see concurrent separately reported CT of the chest.    US Soft Tissue Head And Neck    Result Date: 01/03/2021  EXAM: US SOFT TISSUE HEAD AND NECK DATE: 01/03/2021 9:33 AM ACCESSION: [id number] UN DICTATED: 01/03/2021 9:44 AM INTERPRETATION LOCATION: [facility]     CLINICAL INDICATION: 62 years year old Male: thyroid cancer  - C73 - Thyroid cancer (CMS - HCC)      COMPARISON: 10/04/2020 chest CT and 10/28/2018 thyroid ultrasound     TECHNIQUE:  Ultrasound views of the thyroid bed were obtained using gray scale and limited color Doppler imaging.     FINDINGS: Patient is status post total thyroidectomy. No residual thyroid tissue is visualized in the thyroidectomy bed.     Previously visualized heterogeneous nodule at the midline superior to the thyroidectomy bed is not visualized on today's examination.     No pathologically enlarged lymph nodes were identified throughout the neck.         1. Status post total thyroidectomy without sonographic evidence of residual thyroid tissue or recurrent disease with the thyroid bed. 2. Previously visualized heterogeneous nodule midline and superior to the thyroidectomy bed is not visualized on this examination. 3. No pathologically enlarged lymph nodes identified within the neck.         Lymph node measurements: Right neck levels: I: 0.5 II: III: IV: V: 0.4 VI: Left neck levels: I: 0.6 II: 0.6 III: 0.3 IV: V: 0.4 VI: 0.5             Electronically signed by:    Raliegh Ip, MD  Radiation Oncology PGY-3  [facility]  01/03/2021  3:04 PM             ATTENDING ATTESTATION:    I saw and evaluated/examined the patient, participating in the key portions of the service.  I discussed the findings, assessment, and plan of care with the resident.  I have edited and agree with the findings and plan as documented in the resident's note.      I personally reviewed all relevant data associated with this encounter including imaging, labwork, and prior records.      Mina Marble, MD, PhD  Assistant Professor  Department of Radiation Oncology  [facility]  [id number]  [place], [place] [address]  O: [phone]  P: [phone]

## 2021-01-03 NOTE — Unmapped (Signed)
SiteLast TxDose  ZO:XWRUE Neck: 07/09/2013: 4,800/4,800 cGy  AV:WUJWJ Brai: 01/20/2020: 3,000/3,000 cGy    01/03/2021      Subjective/Assessment/Recommendations: pt is here for follow up    1. Fatigue: no issues  2. Pain: no issues  3. Elimination: no issues  4. Prescription Needs: none  5. Psychosocial: has support  6. Other: MRI and CT scan today  Some vision issues, needs to see eye doctor

## 2021-01-03 NOTE — Unmapped (Signed)
PT in lab for blood draws, PT arrived with PIV in place, labs obtained by Durwin Reges and PIV removed. Labs sent for analysis.

## 2021-01-03 NOTE — Unmapped (Signed)
See dermatology and lymphedema therapy.   See me back in 3mos w/new scans and labs.     For appointments & questions Monday through Friday 8 AM--5 PM     Please call [phone] or Toll free [phone]    For urgent clinical needs on Nights, Weekends or Holidays  Call [phone] and ask for the oncologist on call.     For appointment changes please contact during normal business hours.     Please visit PrivacyFever.[email or web address], a resource created just for family members and caregivers.  This website lists support services, how and where to ask for help. It has tools to assist you as you help [place] care for your loved one.    [facility]  [address]  [place], [place] [address]  [email or web address]

## 2021-01-03 NOTE — Unmapped (Signed)
PRIMARY CARE PHYSICIAN  TIMOTHY R DRAPER, DO  [address]  [place] [address]    CONSULTING PHYSICIANS  [name], [place]  [address]  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   -S/p thyroidectomy 06/2011 and radioactive iodine 03/2012 and again 05/2013.    -Thyroid CA recurrence 11/2012 s/p L neck dissecion 12/07/2012.  -2nd thyroid CA recurrence 03/2013 AND co-existing 1st melanoma recurrence 03/2013 s/p R -cervical level 2-4 node dissection and partial parotidectomy  -S/p radiation to R neck, completed 06/2013  -Nivolumab (02/12/18 x 2 cycles) complicated by colitis that required a 6 week steroid taper.   -s/p WBRT, completed 01/20/20    CURRENT THERAPY:   BRAF/MEK started early May 2020    ASSESSMENT: [name] is a pleasant 45 y.o. male who presents today for further evaluation of recurrent stage IV melanoma. 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    PLAN  1. Stage IV melanoma: BRAF+ by IHC. Dr Eyvonne Left reviewed path: TILS present, non-brisk; lymphocytes are 20 to 30% the number of melanoma cells.  Known sites of disease include abdominal/pelvic LN, and brain. Started dabrafenib/trametinib mid-May 2020, has had stable disease extracranially.      - Reviewed path from hernia surgery in Jan 2022, noted that the abdominal wall/peritoneal implants contained melanophages, but no melanoma cells.  We discussed that this is interesting, good to know that there wasn't melanoma in the biopsy, is in keeping w/immune response.    - He is back on BRAF/MEK targeted therapy.  Will continue.   - He is s/p WBRT, completed 01/20/20. MRI brain from 9/21 personally reviewed and c/w stable disease.  Rad/onc is following.  - I personally reviewed CT chest from today **** which is NED, abdominal MRI **** w/out progression of disease.   -clinically, he is doing very well, much improved over time.  -RTC in 12 weeks for clinical follow up and repeat imaging.  -Of note, we have attempted to confirm BRAF on NGS twice, on original primary specimen from July 2012 and and pelvic LN sample from 01/09/2018.  Both samples were insufficient tissue for analysis.  At this point, BRAF is known by IHC only.     #.) Brain mets; s/p WBRT as per above.  Brain MRI from today 10/04/20.  Asx at this time.     #) L ear fullness, hearing loss; ENT following.  Brain MRI w/sinus and mastoid air cell abnormalities.  Another procedure is planned.     #.) Atypical Crohn's; recent colonoscopy w/ulcerations and crypt abscesses.  Sulfasalazine per GI medicine.    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 During hospital stay, it was determined that this was malignant thrombus and no AC recommended.  Will follow.     #.) Hernia repair; all scars healing well and he is eating and drinking normally.  Surgeon aware abdominal wall finding, CTM.     #.) Thyroid CA, recurrent. Well controlled now. Followed by endocrinology for thyroid and Ca+ replacement and ongoing monitoring of glucose.    #.) Mood; excessive stress related to h/o 2 malignancies, job loss and legal issues surrounding, parents both passed away in recent years.   He follows w/ Drs. Yopp and [facility].     #.)  HypoCA; he struggles w/compliance during the workday,cont Ca supplementation    #.) Supportive care/GOC;appreciate pall care support and med titration. Discussed w/[name]; OK to pause pall care for now given well controlled disease and well controlled symptoms.      INTERVAL HISTORY: This is a return visit to the [facility] for further evaluation of melanoma.  Has had some increased back pain w/radiculopathy on the L side, better w/NSAIDs.    Also notes some bilateral arm tingling.  Hasn't seen ortho recently.  Had symptomatic improvement of hearing loss after myringotomy 5/26 and 09/13/20.  Had PE tube placed and this has helped, hear is much better.     Energy is stable.    Works 45-50hrs/week.  No more dry heaving, no more nausea.  HA/scalp pain much better.  No new CNS symptoms.    Weight increased due to stress eating.  Appetite is improved.    Notes some increased fullness in R check, w/weaping, every few weeks.  This is new.    Bowels are normal for him; recent diagnosis of atypical crohn's disease, started on sulfasalazine 2 tabs BID  Follows w/Drs. [facility], finds this helpful.Mood is good.       PAST MEDICAL HISTORY:   1. Diverticulitis.   2. History of ankylosing spondylitis in 2009. Please see past medical history dictated on 11/15/2010 for details. In brief, he was treated w/Enbrel and prednisone for 6mos or so.  Symptoms improved, immunosuppressants were stopped and symptoms did not recur.   3. S/p incisional hernia repair       SOCIAL HISTORY:    reports that he has never smoked. He has never used smokeless tobacco. He reports current alcohol use. He reports that he does not use drugs.      MEDICATIONS:   Current Outpatient Medications   Medication Sig Dispense Refill   ??? acetaminophen (TYLENOL) 500 MG tablet Take 2 tablets (1,000 mg total) by mouth every eight (8) hours as needed for pain. 30 tablet 0   ??? binimetinib ([name]) 15 mg tablet Take 3 tablets (45 mg total) by mouth Two (2) times a day. 180 tablet 11   ??? blood sugar diagnostic Strp Dispense 100 blood glucose test strips, ok to sub any brand preferred by insurance/patient, use 3x/day; dispense whatever brand matches with meter. 100 strip 12   ??? blood-glucose meter kit Use as instructed; dispense 1 meter, whatever is preferred by insurance 1 each 1   ??? calcitrioL (ROCALTROL) 0.25 MCG capsule Take 1 capsule (0.25 mcg total) by mouth daily. 90 capsule 3   ??? calcium carbonate 650 mg calcium (1,625 mg) tablet Take 1 tablet by mouth Three (3) times a day with a meal.     ??? DULoxetine (CYMBALTA) 30 MG capsule Take 1 capsule (30 mg total) by mouth Two (2) times a day. 180 capsule 2   ??? encorafenib (BRAFTOVI) 75 mg capsule Take 6 capsules (450 mg total) by mouth daily. 180 capsule 11   ??? fexofenadine (ALLEGRA ALLERGY) 180 MG tablet Take 180 mg by mouth daily as needed (allergies).      ??? fluticasone propionate (FLONASE) 50 mcg/actuation nasal spray 2 sprays into each nostril two (2) times a day. 16 g 6   ??? folic acid (FOLVITE) 1 MG tablet TAKE 1 TABLET BY MOUTH EVERY DAY 90 tablet 3   ??? ibuprofen (ADVIL,MOTRIN) 200 MG tablet Take 600 mg by mouth as needed in the morning for pain.     ???  lancets Misc Dispense 100 lancets, ok to sub any brand preferred by insurance/patient, use 3x/day 100 each 12   ??? levothyroxine (SYNTHROID) 125 MCG tablet Take 2 tablets (250 mcg total) every Sunday (ie 1 day per week). Take 3 tablets (375 mcg total) on Monday-Saturday (ie 6 days per week). (Patient taking differently: Take 375 mcg by mouth daily.) 270 tablet 3   ??? memantine (NAMENDA) 10 MG tablet Take 10 mg by mouth daily.     ??? memantine (NAMENDA) 10 MG tablet TAKE 1 TABLET BY MOUTH TWO TIMES A DAY. 180 tablet 4   ??? mirtazapine (REMERON) 7.5 MG tablet Take 1 tablet (7.5 mg total) by mouth nightly. 90 tablet 3   ??? ofloxacin (OCUFLOX) 0.3 % ophthalmic solution Instill 4 drops in left ear twice daily for 5 days 5 mL 3   ??? ondansetron (ZOFRAN) 8 MG tablet Take 1 tablet by mouth Take as directed.     ??? sulfaSALAzine (AZULFIDINE) 500 mg tablet Take 2 tablets (1,000 mg total) by mouth two (2) times a day. 120 tablet 5     No current facility-administered medications for this visit.       REVIEW OF SYSTEMS  A complete review of systems was obtained and was negative except for those symptoms listed in the HPI     PHYSICAL EXAM  There were no vitals filed for this visit.    GEN: Awake and alert, pleasant appearing male in no acute distress  LUNGS: normal WOB, CTAB  CV: NAD, S1S2  ABD; obese, soft NTND  SKIN: No rashes, petechiae or jaundice noted.  All melanoma scarlines examined; no nodularity.    PYSCH: Alert and oriented to person, place and time  EXT: No edema noted of the lower extremity noted.          LABS  Lab Results   Component Value Date    WBC 7.2 10/04/2020    HGB 12.5 (L) 10/04/2020    HCT 38.5 (L) 10/04/2020    PLT 237 10/04/2020       Lab Results   Component Value Date    NA 139 10/04/2020    K 3.9 10/04/2020    CL 107 10/04/2020    CO2 25.0 10/04/2020    BUN 17 10/04/2020    CREATININE 0.94 10/04/2020    GLU 100 10/04/2020    CALCIUM 8.4 (L) 10/04/2020    MG 1.8 02/01/2020    PHOS 5.8 (H) 05/10/2020       Lab Results   Component Value Date    BILITOT 0.3 10/04/2020    BILIDIR 0.20 01/27/2020    PROT 7.1 10/04/2020    ALBUMIN 4.1 10/04/2020    ALT 19 10/04/2020    AST 25 10/04/2020    ALKPHOS 64 10/04/2020    GGT 42 08/22/2011       Lab Results   Component Value Date    INR 1.12 01/24/2020       RADIOLOGY RESULTS;        CT chest 01/03/21:  IMPRESSION:  ??  No intrathoracic metastasis.      Brain MRI 01/03/21:  IMPRESSION:  ??  1. Similar size and distribution of innumerable cerebral and cerebellar micrometastases. No new lesions.      Abd MRI 01/03/21:  IMPRESSION:  Unchanged T1 hyperintense lesions within hepatic segment V/VIII and capsular soft tissue implants described above. Otherwise, no new or enlarging sites of metastatic disease within the abdomen.  ??  There is focal  area of increased T2 signal with associated enhancement along the left medial psoas muscle adjacent to L2-L3 intervertebral disc and there is  evidence of linear enhancement along the margin of the intervertebral disc abutting the left psoas muscle (series 18 image 61, series 7 image 33, series 20 image 75). This is likely degenerative and enhancement is seen along the left psoas muscle is likely secondary to the inflammation due to bulging of the intervertebral disc. Attention on follow-up.  ??  For findings above the diaphragm, please see concurrent separately reported CT of the chest.    Echo 04/29/19:  Summary    1. The left ventricle is normal in size with normal wall thickness.    2. The left ventricular systolic function is normal, LVEF is visually  estimated at 55%.    3. The right ventricle is upper normal in size, with normal systolic  function.          PATHOLOGY:  Pelvic LN 01/09/18:  Final Diagnosis   A: Colon, transverse, biopsy  - Moderately-severely active chronic colitis  - No CMV viral cytopathic effect, granuloma, or dysplasia identified  - No metastatic melanoma or papillary thyroid cancer identified  - See comment  ??  B: Lymph node, pelvic, core needle biopsy  - Metastatic melanoma  - See comment  ??     FNA R femur 07/19/14:  : Bone, right femur, core biopsy with touch imprints  - No primary or metastatic malignancy identified  - Bone with focal osteocyte dropout, suggestive of necrosis  - Bone marrow with trilineage hematopoiesis and focal effacement by chronic inflammation (see comment)    B: Bone, right femur, fine needle aspiration  - Few atypical cells present on direct smears (see comment)  - Scant fragments of edematous stromal tissue in cell block  - Bone marrow with trilineage hematopoiesis    Diagnosis:  Lymph nodes, right cervical, levels 2-4, removal and partial parotidectomy  -Metastatic melanoma involving 1 out of 20 lymph nodes, with the largest  diameter measuring 10 mm (1.0 cm) and no evidence of extracapsular extension  (1/20)  -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 focal effacement by chronic inflammation (see comment)    B: Bone, right femur, fine needle aspiration  - Few atypical cells present on direct smears (see comment)  - Scant fragments of edematous stromal tissue in cell block  - Bone marrow with trilineage hematopoiesis    Diagnosis:  Lymph nodes, right cervical, levels 2-4, removal and partial parotidectomy  -Metastatic melanoma involving 1 out of 20 lymph nodes, with the largest  diameter measuring 10 mm (1.0 cm) and no evidence of extracapsular extension  (1/20)  -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

## 2021-01-09 ENCOUNTER — Telehealth: Admit: 2021-01-09 | Discharge: 2021-01-10 | Payer: PRIVATE HEALTH INSURANCE | Attending: Clinical | Primary: Clinical

## 2021-01-09 DIAGNOSIS — F411 Generalized anxiety disorder: Principal | ICD-10-CM

## 2021-01-09 DIAGNOSIS — F330 Major depressive disorder, recurrent, mild: Principal | ICD-10-CM

## 2021-01-09 DIAGNOSIS — C801 Malignant (primary) neoplasm, unspecified: Principal | ICD-10-CM

## 2021-01-09 NOTE — Unmapped (Signed)
[facility]  Psychiatry  Psychotherapy Note - Telehealth via??Video  ??  Service Date:??December 27,??2022  Service:??45??minutes of??psychotherapy via??video??session  Time with Patient:??45??minutes  ??  Encounter Description:??This encounter was conducted from provider's office via EPIC video session due to COVID-19 pandemic. Demontrez Rindfleisch was located in his home.??Session was conducted via EPIC.????Rationale for??video session??is need to social distance. See Plan for telemedicine consent/disclaimer.   ??  Encounter Description/Consent:??  [name] visit was completed through telehealth encounter (video).   ??  This patient encounter is appropriate and reasonable under the circumstances. The patient has been advised of the potential risks and limitations of this mode of treatment (including, but not limited to, the absence of in-person examination) and has agreed to be treated in a remote fashion in spite of them. Any and all of the patient's/patient's family's questions on this issue have been answered.   ??  The patient was physically located in [place] in which I am permitted to provide care. The patient??understood that??he may incur co-pays and cost sharing, and agreed to the telemedicine visit. The visit was??reasonable and appropriate under the circumstances given the patient's presentation at the time.   ??  Time Spent: 45??minutes  ??  Assessment:  Mr. Demore a 45yo??male??with metastatic melanoma, h/o thyroid cancer, recent portal vein thrombosis, who I see in therapy to address depression and enhance coping with??multiple??stressors.??I??met??with Mr. Crossley??today via telehealth (video).  Mr. Shonk reported feeling relief following his most recent scans. He shared that his mood has been okay and intonated that he has had a better holiday season than expected.  Legal stressors continue with no resolution in sight. ??He is looking forward to a taking a trip on his motorcycle for February.  I offered psychotherapy, support,??and recommendations??for addressing anxiety and mood concerns.  Will see next??in ~ three weeks.    Risk Assessment:  A suicide and violence risk assessment was performed as part of this evaluation. There patient is deemed to be at chronic elevated risk??for self-harm/suicide given the following factors: current diagnosis of depression, suicidal ideation or threats without a plan and past diagnosis of depression. There patient is deemed to be at chronic elevated risk??for violence given the following factors: male gender. These risk factors are mitigated by the following factors: lack of active SI/HI. There is no acute risk for suicide or violence at this time. The patient was educated about relevant modifiable risk factors including following recommendations for treatment of psychiatric illness and abstaining from substance abuse.??While future psychiatric events cannot be accurately predicted, the patient does not currently require acute inpatient psychiatric care and does not currently meet [facility] involuntary commitment criteria.??????????????????  ??  Plan:  1.??I will continue to see pt for psychotherapy. ??Next session??is??scheduled??to take place on 1/20.  2. Mr. Ragain??has??information on available [facility] resources.??Pt is working with [name], [name]. ??I have??previously??reached out to [name] to explore financial resources.  3.??Mr. Griffie has refill of Remeron.  4. Pt has??my contact information and knows??to reach me as needed.  ??  Subjective:??  Mr. Hissong??presented as alert,??oriented, and engaged throughout??the??session.  ??  Mr. Heffner??reported that he received good news on his scan results a couple of weeks ago and described feeling understandably relieved.  We discussed these results in the context of his tendency to analyze bodily cues (e.g., vision problems) ahead of scans.  Mr. Arns reported that overall his of late has been okay.  He had a good Christmas at his brother's home and intonated that the  holiday season has not been as difficult for him as he expected.  He noted having no thoughts of self-harm and endorsed no emergent concerns.  Unfortunately, legal stressors continue to affect his life in real and meaningful ways.  There has been no recent movement on this and there is very little he can do at this point to influence this - thus the issue feels like it hangs over his head.  Mr. Morais did share that he is looking forward to a taking a trip on his motorcycle for February.     I provided Mr. Vold support and understanding, and??offered??him with??a forum to process his thoughts,??feelings, and experiences - particularly those related to legal stressors.?? We??reviewed and??problem-solved steps that he can take to address anxiety and poor mood - and to tolerate??the??distressing aspects of a situation that he has very little??control over.?? Mr.??Burkes was??engaged during our session as usual and my sense is that he benefits from therapy. ??We will meet next in ~ three??weeks.  ??  Mental Status Exam:  Appearance:  ?? ??Appears stated age   Motor: ?? ??No abnormal movements   Speech/Language:  ?? ??Normal rate, volume, tone, fluency   Mood: ?? okay   Affect: ?? ??pleasant   Thought process: ?? ??Logical, linear, clear, coherent, goal-directed   Thought content: ?? ?? ??Denies SI/HI or thought of self-harm   Perceptual disturbances: ?? ?? ??Behavior not concerning for response to internal stimuli  ??   Orientation: ?? ??Oriented to person, place, time, and general circumstances   Attention: ?? ??Able to fully attend without fluctuations in consciousness   Concentration: ?? ??Able to fully concentrate and attend   Memory: ?? ??Immediate, short-term, long-term, and recall grossly intact   ??Fund of knowledge:  ?? ??Consistent with level of education and development   Insight: ?? ?? ??Intact   Judgment:  ?? ??Intact   Impulse Control: ?? ??Intact   ??  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  [name]. Ezrah Dembeck PhD  December 27,??2022

## 2021-01-18 ENCOUNTER — Telehealth: Admit: 2021-01-18 | Discharge: 2021-01-19 | Payer: PRIVATE HEALTH INSURANCE | Attending: Clinical | Primary: Clinical

## 2021-01-19 NOTE — Unmapped (Signed)
[facility]  Psychiatry  Psychotherapy Note - Telehealth via??Video  ??  Service Date: January 5,??46?2023  Service:??45??minutes of??psychotherapy via??video??session  Time with Patient:??45??minutes  ??  Encounter Description:??This encounter was conducted from provider's office via EPIC video session due to COVID-19 pandemic. Cord Wilczynski was located in his home.??Session was conducted via EPIC.????Rationale for??video session??is need to social distance. See Plan for telemedicine consent/disclaimer.   ??  Encounter Description/Consent:??  [name] visit was completed through telehealth encounter (video).   ??  This patient encounter is appropriate and reasonable under the circumstances. The patient has been advised of the potential risks and limitations of this mode of treatment (including, but not limited to, the absence of in-person examination) and has agreed to be treated in a remote fashion in spite of them. Any and all of the patient's/patient's family's questions on this issue have been answered.   ??  The patient was physically located in [place] in which I am permitted to provide care. The patient??understood that??he may incur co-pays and cost sharing, and agreed to the telemedicine visit. The visit was??reasonable and appropriate under the circumstances given the patient's presentation at the time.   ??  Time Spent: 45??minutes  ??  Assessment:  Mr. Evitt a 46yo??male??with metastatic melanoma, h/o thyroid cancer, recent portal vein thrombosis, who I see in therapy to address depression and enhance coping with??multiple??stressors.?? Mr. Rozo contacted me yesterday to ask that we meet soon, and today's session was scheduled.  Mr. Leino??has struggled since Monday night after an incident triggered memories of a traumatic event in his life.  Although still shaken, he reported feeling better today than two days ago.  No emergent concerns noted.  I offered psychotherapy, support,??and the forum to process his experiences.  ??  Risk Assessment:  A suicide and violence risk assessment was performed as part of this evaluation. There patient is deemed to be at chronic elevated risk??for self-harm/suicide given the following factors: current diagnosis of depression, suicidal ideation or threats without a plan and past diagnosis of depression. There patient is deemed to be at chronic elevated risk??for violence given the following factors: male gender. These risk factors are mitigated by the following factors: lack of active SI/HI. There is no acute risk for suicide or violence at this time. The patient was educated about relevant modifiable risk factors including following recommendations for treatment of psychiatric illness and abstaining from substance abuse.??While future psychiatric events cannot be accurately predicted, the patient does not currently require acute inpatient psychiatric care and does not currently meet [facility] involuntary commitment criteria.??????????????????  ??  Plan:  1.??I will continue to see pt for psychotherapy. ??Next session??is??scheduled??to take place on 1/20.  2. Mr. General??has??information on available CCSP resources.??Pt is working with [name], [name]. ??I have??previously??reached out to [name] to explore financial resources.  3.??Mr. Bedrosian has refill of Remeron.  4. Pt has??my contact information and knows??to reach me as needed.  ??  Subjective:??  Mr. Olmeda??presented as alert,??oriented, and engaged throughout??the??session.  He was not visibly anxious or in distress.    Mr. Buysse contacted me yesterday to ask that we meet soon, and today's session was scheduled.  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 Mr. Scharnhorst described  being preoccupied and shaken after watching this unfold on TV - and bringing [him] back to his traumatic incident.  He did report feeling better today than two days ago and he has been able to go to work every day since.  No emergent concerns noted.  I offered Mr. Heikkila psychotherapy, support,??and the forum to process his experiences. He was very participatory and showed good insight in linking the traumatic event from years ago with subsequent legal stressors.    Mental Status Exam:  Appearance:  ?? ??Appears stated age   Motor: ?? ??No abnormal movements   Speech/Language:  ?? ??Normal rate, volume, tone, fluency   Mood: ?? better than a couple of days ago, but still shaken   Affect: ?? ??engaged   Thought process: ?? ??Logical, linear, clear, coherent, goal-directed   Thought content: ?? ?? ??Denies SI/HI or thought of self-harm   Perceptual disturbances: ?? ?? ??Behavior not concerning for response to internal stimuli  ??   Orientation: ?? ??Oriented to person, place, time, and general circumstances   Attention: ?? ??Able to fully attend without fluctuations in consciousness   Concentration: ?? ??Able to fully concentrate and attend   Memory: ?? ??Immediate, short-term, long-term, and recall grossly intact   ??Fund of knowledge:  ?? ??Consistent with level of education and development   Insight: ?? ?? ??Intact   Judgment:  ?? ??Intact   Impulse Control: ?? ??Intact   ??  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  [name]. Boomer Winders PhD  January 5,??2023

## 2021-01-19 NOTE — Unmapped (Signed)
[facility] Refill Coordination Note    Specialty Medication(s) to be Shipped:   Hematology/Oncology: Kenneth Fitzgerald and [name]    Other medication(s) to be shipped: No additional medications requested for fill at this time     Kenneth Fitzgerald, DOB: 03-26-1975  Phone: [phone] (home)       All above HIPAA information was verified with patient.     Was a Nurse, learning disability used for this call? No    Completed refill call assessment today to schedule patient's medication shipment from the [facility] [phone]).  All relevant notes have been reviewed.     Specialty medication(s) and dose(s) confirmed: Regimen is correct and unchanged.   Changes to medications: Penny reports no changes at this time.  Changes to insurance: No  New side effects reported not previously addressed with a pharmacist or physician: None reported  Questions for the pharmacist: No    Confirmed patient received a Conservation officer, historic buildings and a Surveyor, mining with first shipment. The patient will receive a drug information handout for each medication shipped and additional FDA Medication Guides as required.       DISEASE/MEDICATION-SPECIFIC INFORMATION        N/A    SPECIALTY MEDICATION ADHERENCE     Medication Adherence    Patient reported X missed doses in the last month: 0  Specialty Medication: Braftovi 75 mg  Patient is on additional specialty medications: Yes  Additional Specialty Medications: [name] 15mg   Patient Reported Additional Medication X Missed Doses in the Last Month: 0  Patient is on more than two specialty medications: No  Informant: patient              Were doses missed due to medication being on hold? No    [name]  15 mg: 14 days of medicine on hand   Braftovi 75 mg: 14 days of medicine on hand        REFERRAL TO PHARMACIST     Referral to the pharmacist: Not needed      [facility]     Shipping address confirmed in Epic.     Delivery Scheduled: Yes, Expected medication delivery date: 01/31/21.     Medication will be delivered via UPS to the prescription address in Epic [place].    [name] M [name]   [facility] Specialty Technician

## 2021-01-30 MED FILL — BRAFTOVI 75 MG CAPSULE: ORAL | 30 days supply | Qty: 180 | Fill #3

## 2021-01-30 MED FILL — MEKTOVI 15 MG TABLET: ORAL | 30 days supply | Qty: 180 | Fill #3

## 2021-02-02 ENCOUNTER — Telehealth: Admit: 2021-02-02 | Discharge: 2021-02-03 | Payer: PRIVATE HEALTH INSURANCE | Attending: Clinical | Primary: Clinical

## 2021-02-02 NOTE — Unmapped (Signed)
[facility]  Psychiatry  Psychotherapy Note - Telehealth via??Video  ??  Service Date: January 20,??2023  Service:??45??minutes of??psychotherapy via??video??session  Time with Patient:??45??minutes  ??  Encounter Description:??This encounter was conducted from provider's office via EPIC video session due to COVID-19 pandemic. Caven Perine was located in his home.??Session was conducted via EPIC.????Rationale for??video session??is need to social distance. See Plan for telemedicine consent/disclaimer.   ??  Encounter Description/Consent:??  [name] visit was completed through telehealth encounter (video).   ??  This patient encounter is appropriate and reasonable under the circumstances. The patient has been advised of the potential risks and limitations of this mode of treatment (including, but not limited to, the absence of in-person examination) and has agreed to be treated in a remote fashion in spite of them. Any and all of the patient's/patient's family's questions on this issue have been answered.   ??  The patient was physically located in [place] in which I am permitted to provide care. The patient??understood that??he may incur co-pays and cost sharing, and agreed to the telemedicine visit. The visit was??reasonable and appropriate under the circumstances given the patient's presentation at the time.   ??  Time Spent: 45??minutes  ??  Assessment:  Mr. Zeek a 45yo??male??with metastatic melanoma, h/o thyroid cancer, recent portal vein thrombosis, who I see in therapy to address depression and enhance coping with??multiple??stressors.?? I met with  Mr. Mckamie today via telehealth.  Mr. Kempfer reported feeling better following the triggering episode from two weeks ago.  No new or pressing psychosocial concerns noted, although he continues to try to cope with medical, and especially, legal stressors.  I offered psychotherapy, support,??and recommendations??for addressing anxiety and mood concerns.????Will see next??in ~ three weeks.  ??  Risk Assessment:  A suicide and violence risk assessment was performed as part of this evaluation. There patient is deemed to be at chronic elevated risk??for self-harm/suicide given the following factors: current diagnosis of depression, suicidal ideation or threats without a plan and past diagnosis of depression. There patient is deemed to be at chronic elevated risk??for violence given the following factors: male gender. These risk factors are mitigated by the following factors: lack of active SI/HI. There is no acute risk for suicide or violence at this time. The patient was educated about relevant modifiable risk factors including following recommendations for treatment of psychiatric illness and abstaining from substance abuse.??While future psychiatric events cannot be accurately predicted, the patient does not currently require acute inpatient psychiatric care and does not currently meet [facility] involuntary commitment criteria.??????????????????  ??  Plan:  1.??I will continue to see pt for psychotherapy. ??Next session??is??scheduled??to take place in several weeks.  2.??Mr. Marengo??has??information on available CCSP resources.??Pt is working with [name], [name]. ??I have??previously??reached out to [name] to explore financial resources.  3.??Mr. Zaring continues to take Remeron.  4. Pt has??my contact information and knows??to reach me as needed.  ??  Subjective:??  [name]??presented as alert,??oriented, and engaged throughout??the??session.  He was not visibly anxious or in distress.  ??  Mr. Rape shared that over the last two weeks, he has done better following the triggering episode of the [employer] player needing CPR on the field. He stated that he thinks about it some still, but noted that it is not something he thinks about often or is significantly affected by.  Mr. Nevel continues to work full-time and is satisfied enough with his job. He is also in frequent contact with - and sometimes  visits - family members. These are protective factors for the ongoing stressors that he continues to face with regard to his medical and legal situations. The latter continues to affect him more greatly and interfere with attempts to reimagine his life.  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    I provided Mr. Tsang support and understanding, and he used today's session to process his thoughts,??feelings, and experiences - particularly those related to legal stressors.?? We??reviewed and??problem-solved steps that he can take to address distress- and to??tolerate??the??distressing aspects of a situation that he cannot control.????Mr.??Ikeda was??engaged during our session as usual and my sense is that he benefits from therapy. ??We will meet next in several weeks.  ??  Mental Status Exam:  Appearance:  ?? ??Appears stated age   Motor: ?? ??No abnormal movements   Speech/Language:  ?? ??Normal rate, volume, tone, fluency   Mood: ?? okay   Affect: ?? ??engaged   Thought process: ?? ??Logical, linear, clear, coherent, goal-directed   Thought content: ?? ?? ??Denies SI/HI or thought of self-harm   Perceptual disturbances: ?? ?? ??Behavior not concerning for response to internal stimuli  ??   Orientation: ?? ??Oriented to person, place, time, and general circumstances   Attention: ?? ??Able to fully attend without fluctuations in consciousness   Concentration: ?? ??Able to fully concentrate and attend   Memory: ?? ??Immediate, short-term, long-term, and recall grossly intact   ??Fund of knowledge:  ?? ??Consistent with level of education and development   Insight: ?? ?? ??Intact   Judgment:  ?? ??Intact   Impulse Control: ?? ??Intact   ??  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  [name]. Tashonna Descoteaux PhD  January 20,??2023

## 2021-02-21 NOTE — Unmapped (Signed)
[facility] Clinical Assessment & Refill Coordination Note    Kenneth Fitzgerald [facility], DOB: 06/28/1975  Phone: [phone] (home)     All above HIPAA information was verified with patient.     Was a Nurse, learning disability used for this call? No    Specialty Medication(s):   Hematology/Oncology: Kenneth Fitzgerald     Current Outpatient Medications   Medication Sig Dispense Refill   ??? acetaminophen (TYLENOL) 500 MG tablet Take 2 tablets (1,000 mg total) by mouth every eight (8) hours as needed for pain. 30 tablet 0   ??? binimetinib ([name]) 15 mg tablet Take 3 tablets (45 mg total) by mouth Two (2) times a day. 180 tablet 11   ??? blood sugar diagnostic Strp Dispense 100 blood glucose test strips, ok to sub any brand preferred by insurance/patient, use 3x/day; dispense whatever brand matches with meter. 100 strip 12   ??? blood-glucose meter kit Use as instructed; dispense 1 meter, whatever is preferred by insurance 1 each 1   ??? calcitrioL (ROCALTROL) 0.25 MCG capsule Take 1 capsule (0.25 mcg total) by mouth daily. 90 capsule 3   ??? calcium carbonate 650 mg calcium (1,625 mg) tablet Take 1 tablet by mouth Three (3) times a day with a meal.     ??? DULoxetine (CYMBALTA) 30 MG capsule Take 1 capsule (30 mg total) by mouth Two (2) times a day. 180 capsule 2   ??? encorafenib (BRAFTOVI) 75 mg capsule Take 6 capsules (450 mg total) by mouth daily. 180 capsule 11   ??? fexofenadine (ALLEGRA ALLERGY) 180 MG tablet Take 180 mg by mouth daily as needed (allergies).      ??? fluticasone propionate (FLONASE) 50 mcg/actuation nasal spray 2 sprays into each nostril two (2) times a day. 16 g 6   ??? folic acid (FOLVITE) 1 MG tablet TAKE 1 TABLET BY MOUTH EVERY DAY 90 tablet 3   ??? ibuprofen (ADVIL,MOTRIN) 200 MG tablet Take 600 mg by mouth as needed in the morning for pain.     ??? lancets Misc Dispense 100 lancets, ok to sub any brand preferred by insurance/patient, use 3x/day 100 each 12   ??? levothyroxine (SYNTHROID) 125 MCG tablet Take 2 tablets (250 mcg total) every Sunday (ie 1 day per week). Take 3 tablets (375 mcg total) on Monday-Saturday (ie 6 days per week). (Patient taking differently: Take 375 mcg by mouth daily.) 270 tablet 3   ??? memantine (NAMENDA) 10 MG tablet Take 10 mg by mouth daily.     ??? memantine (NAMENDA) 10 MG tablet TAKE 1 TABLET BY MOUTH TWO TIMES A DAY. 180 tablet 4   ??? mirtazapine (REMERON) 7.5 MG tablet Take 1 tablet (7.5 mg total) by mouth nightly. 90 tablet 3   ??? ofloxacin (OCUFLOX) 0.3 % ophthalmic solution Instill 4 drops in left ear twice daily for 5 days 5 mL 3   ??? ondansetron (ZOFRAN) 8 MG tablet Take 1 tablet by mouth Take as directed.     ??? sulfaSALAzine (AZULFIDINE) 500 mg tablet Take 2 tablets (1,000 mg total) by mouth two (2) times a day. 120 tablet 5     No current facility-administered medications for this visit.        Changes to medications: Kenneth Fitzgerald reports no changes at this time.    Allergies   Allergen Reactions   ??? Compazine [Prochlorperazine] Itching   ??? Coconut Nausea And Vomiting       Changes to allergies: No  SPECIALTY MEDICATION ADHERENCE     Braftovi 75 mg: 7 days of medicine on hand   [name] 15 mg: 7 days of medicine on hand     Medication Adherence    Patient reported X missed doses in the last month: 0  Specialty Medication: Braftovi 75mg   Patient is on additional specialty medications: Yes  Additional Specialty Medications: [name] 15mg   Patient Reported Additional Medication X Missed Doses in the Last Month: 0          Specialty medication(s) dose(s) confirmed: Regimen is correct and unchanged.     Are there any concerns with adherence? No    Adherence counseling provided? Not needed    CLINICAL MANAGEMENT AND INTERVENTION      Clinical Benefit Assessment:    Do you feel the medicine is effective or helping your condition? Yes    Clinical Benefit counseling provided? Not needed    Adverse Effects Assessment:    Are you experiencing any side effects? No    Are you experiencing difficulty administering your medicine? No    Quality of Life Assessment:    Quality of Life    Rheumatology  Oncology  Dermatology  Cystic Fibrosis          How many days over the past month did your condition/medication  keep you from your normal activities? For example, brushing your teeth or getting up in the morning. 0    Have you discussed this with your provider? Not needed    Acute Infection Status:    Acute infections noted within Epic:  No active infections  Patient reported infection: None    Therapy Appropriateness:    Is therapy appropriate and patient progressing towards therapeutic goals? Yes, therapy is appropriate and should be continued    DISEASE/MEDICATION-SPECIFIC INFORMATION      N/A    PATIENT SPECIFIC NEEDS     - Does the patient have any physical, cognitive, or cultural barriers? No    - Is the patient high risk? Yes, patient is taking oral chemotherapy. Appropriateness of therapy as been assessed    - Does the patient require a Care Management Plan? No           SHIPPING     Specialty Medication(s) to be Shipped:   Hematology/Oncology: Kenneth Fitzgerald and [name]    Other medication(s) to be shipped: No additional medications requested for fill at this time     Changes to insurance: No    Delivery Scheduled: Yes, Expected medication delivery date: 02/23/21.     Medication will be delivered via UPS to the confirmed prescription address in [facility].    The patient will receive a drug information handout for each medication shipped and additional FDA Medication Guides as required.  Verified that patient has previously received a Conservation officer, historic buildings and a Surveyor, mining.    The patient or caregiver noted above participated in the development of this care plan and knows that they can request review of or adjustments to the care plan at any time.      All of the patient's questions and concerns have been addressed.    Rollen Sox   [facility] Specialty Pharmacist

## 2021-02-22 ENCOUNTER — Ambulatory Visit
Admit: 2021-02-22 | Payer: PRIVATE HEALTH INSURANCE | Attending: Rehabilitative and Restorative Service Providers" | Primary: Rehabilitative and Restorative Service Providers"

## 2021-02-22 DIAGNOSIS — I890 Lymphedema, not elsewhere classified: Principal | ICD-10-CM

## 2021-02-22 DIAGNOSIS — C439 Malignant melanoma of skin, unspecified: Principal | ICD-10-CM

## 2021-02-22 DIAGNOSIS — L905 Scar conditions and fibrosis of skin: Principal | ICD-10-CM

## 2021-02-22 MED FILL — MEKTOVI 15 MG TABLET: ORAL | 30 days supply | Qty: 180 | Fill #4

## 2021-02-22 MED FILL — BRAFTOVI 75 MG CAPSULE: ORAL | 30 days supply | Qty: 180 | Fill #4

## 2021-02-23 NOTE — Unmapped (Signed)
Kenneth Fitzgerald [facility]  22-Dec-1975  [address] 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  Later in Stage II, the limb may not pit as excess subcutaneous fat and fibrosis develop.   []  Stage III: Lymphostatic elephantiasis where pitting can be absent and trophic skin changes such as acanthosis, alterations in skin character and thickness, further deposition of fat and fibrosis, and warty overgrowths have developed.     SYMPTOMS:  PATIENT EXHIBITS THE FOLLOWING SYMPTOMS DESPITE CONSERVATIVE THERAPY (check all that apply):  []  Hyperkeratosis   []  Hyperplasia   [x]  Hyperpigmentation   [x]  Skin breakdown with lymphorrhea (skin weeping)   []  Papillomatosis       CONSERVATIVE TREATMENT:  PATIENT HAS TRIED CONSERVATIVE TREATMENTS (COMPRESSION/EXERCISE/ELEVATION):       [x]  Yes      If YES, select type of conservative treatment and duration of use (check all that apply):  Instructed on self-manual lymphatic drainage techniques (self-MLD): [x]  Yes []  No   Instructed on appropriate skin/nail care practices: [x]  Yes []  No   Compression garments:   []  1-5 months   []  6-12 months  [x]  >1 year   Bandaging:   []  1-5 months   []  6-12 months  [x]  >1 year   Elevation:      []  1-5 months   []  6-12 months  [x]  >1 year   Exercise:        []  1-5 months   []  6-12 months  [x]  >1 year       PNEUMATIC COMPRESSION DEVICE EVALUATION:  Patient has tried an E0652 advanced pneumatic compression device?   []  Yes [x]  No     IF NO, is an [phone] advanced pneumatic compression device medically necessary to treat the clinical symptoms listed above that prevent effective treatment with the E0651 basic pneumatic compression device?  [x]  Yes     Needs Head and Neck Pump      TREATMENT PLAN:  MEASUREMENTS:  Girth Measurements: taken in cm  ??    ??  ??  Neck Composite measures:  DATE: 2-9 ?? ?? ?? ?? ??   Superior (just below mandible)cB at 6 cm below ear  46.2 ?? ?? ?? ??      Middle (midway between top and bottom)cD at 9 cm below ear 43.5 ?? ?? ?? ??      Inferior (lowest circumferential location)cC at 12cm below ear 43.4 ?? ?? ?? ??      Tragus to tragus cA at cm in front of earlobe ?? ?? ?? ?? ??      ??  Facial Measures:  DATE 2-9 2-9 ?? ?? ?? ?? ?? ?? ?? ??   ?? Right Left  Right Left Right Left Right Left Right Left  Tragus to inner corner of eye F1   13 13.2 ?? ?? ?? ?? ?? ?? ??      Tragus to outer corner of mouth F2   14 13.4 ?? ?? ?? ?? ?? ?? ??      Tragus to mid chin F3   18 17.2 ?? ?? ?? ?? ?? ?? ??      Mandibular angle outer corner of mouth F4

## 2021-02-23 NOTE — Unmapped (Signed)
[facility] THERAPY SERVICES   OUTPATIENT PHYSICAL THERAPY  02/22/2021  Note Type: Evaluation  Note Date Range: 02-22-21 to 05-20-21    Patient Name: Kenneth Fitzgerald  Date of Birth:03-02-1975  Diagnosis:   Encounter Diagnoses   Name Primary?   ??? Lymphedema of face Yes   ??? Scar condition and fibrosis of skin    ??? Malignant melanoma, unspecified site (CMS-HCC)      Referring MD:  [name], MD     Date of Onset of Impairment-01/22/2021  Date PT Care Plan Established or Reviewed-02/22/2021  Date PT Treatment Started-02/22/2021   Plan of Care Effective Date:     Session #:  1 of 13      Contraindications:  none  Precautions:  hyper/hypothyroidism  thyroid cancer  Red Flags:  history of cancer melanoma and thyroid , brain mets   Past Medical History:  Past Medical History:   Diagnosis Date   ??? Cancer (CMS-HCC)     melanoma, thyroid cancer   ??? Disease of thyroid gland     hypothyroid   ??? HL (hearing loss)    ??? Hypothyroidism    ??? Skin cancer      Oncology History/Surgical History:  Oncology History   Malignant melanoma, metastatic (CMS-HCC)   03/17/2013 Initial Diagnosis    Melanoma (CMS-HCC)     02/12/2018 - 03/12/2018 Chemotherapy    OP NIVOLUMAB 480 MG Q4W  nivolumab 480 mg every 28 days     Malignant neoplasm of brain, unspecified location (CMS-HCC)   12/22/2019 Initial Diagnosis    Malignant neoplasm of brain, unspecified location (CMS-HCC)     12/22/2019 -  Radiation    Radiation Therapy Treatment Details (Noted on 12/22/2019)  Site: Brain  Technique: IMRT  Goal: No goal specified  Planned Treatment Start Date: No planned start date specified       Past Surgical History:   Procedure Laterality Date   ??? PR COLONOSCOPY W/BIOPSY SINGLE/MULTIPLE N/A 01/09/2018    Procedure: COLONOSCOPY, FLEXIBLE, PROXIMAL TO SPLENIC FLEXURE; WITH BIOPSY, SINGLE OR MULTIPLE;  Surgeon: Vonda Antigua, MD;  [facility]: [facility];  Service: Gastroenterology   ??? PR COLONOSCOPY W/BIOPSY SINGLE/MULTIPLE N/A 08/24/2019    Procedure: COLONOSCOPY, FLEXIBLE, PROXIMAL TO SPLENIC FLEXURE; WITH BIOPSY, SINGLE OR MULTIPLE;  Surgeon: Leland Her, MD;  Location: [facility];  Service: Gastroenterology   ??? PR COLSC FLX WITH DIRECTED SUBMUCOSAL NJX ANY SBST N/A 08/24/2019    Procedure: COLONOSCOPY, FLEXIBLE, PROXIMAL TO SPLENIC FLEXURE; WITH DIRECTED SUBMUCOSAL INJECTION(S), ANY SUBSTANCE;  Surgeon: Leland Her, MD;  Location: [facility];  Service: Gastroenterology   ??? PR EXPLORATORY OF ABDOMEN N/A 01/24/2020    Procedure: EXPLORATORY LAPAROTOMY, EXPLORATORY CELIOTOMY WITH OR WITHOUT BIOPSY(S);  Surgeon: [name], MD;  Location: MAIN OR [facility];  Service: Trauma   ??? PR FREEING BOWEL ADHESION,ENTEROLYSIS N/A 01/24/2020    Procedure: ENTEROLYSIS (SEPART PROC);  Surgeon: [name], MD;  Location: MAIN OR [facility];  Service: Trauma   ??? PR IMPLANT MESH HERNIA REPAIR/DEBRIDEMENT CLOSURE N/A 01/03/2015    Procedure: IMPLANTATION OF MESH/OTHER PROSTHES INCISION/VENTRAL HERNIA REPAIR/MESH CLOSE DEBRID NECROT SOFT TIS INFECT;  Surgeon: Romero Belling, MD;  Location: MAIN OR [place];  Service: Gastrointestinal   ??? PR LAP, VENTRAL HERNIA REPAIR,REDUCIBLE N/A 04/07/2014    Procedure: LAPAROSCOPY, SURGICAL, REPAIR, VENTRAL, UMBILICAL, SPIGELIAN OR EPIGASTRIC HERNIA, REDUCIBLE;  Surgeon: Romero Belling, MD;  Location: MAIN OR [place];  Service: Gastrointestinal   ??? PR  NEGATIVE PRESSURE WOUND THERAPY DME >50 SQ CM N/A 01/24/2020    Procedure: NEG PRESS WOUND TX (VAC ASSIST) INCL TOPICALS, PER SESSION, TSA GREATER THAN/= 50 CM SQUARED;  Surgeon: [name], MD;  Location: MAIN OR [facility];  Service: Trauma   ??? PR REMOVAL NODES, NECK,CERV CMPLT Left 12/07/2012    Procedure: CERVICAL LYMPHADENECTOMY (COMPLETE);  Surgeon: Charlott Rakes, MD;  Location: MAIN OR [facility];  Service: Surgical Oncology   ??? PR REMOVAL NODES, NECK,CERV MOD RAD Right 03/29/2013    Procedure: CERVICAL LYMPHADENECTOMY (MODIFIED RADICAL NECK DISSECTION);  Surgeon: Charlott Rakes, MD;  Location: MAIN OR [facility];  Service: Surgical Oncology   ??? PR REPAIR RECURR INCIS HERNIA,REDUC N/A 01/03/2015    Procedure: REPAIR RECURRENT INCISIONAL OR VENTRAL HERNIA; REDUCIBLE;  Surgeon: Romero Belling, MD;  Location: MAIN OR [place];  Service: Gastrointestinal   ??? PR REPAIR RECURR INCIS HERNIA,STRANG N/A 06/21/2016    Procedure: REPAIR RECURRENT INCISIONAL OR VENTRAL HERNIA; INCARCERATED OR STRANGULATED;  Surgeon: Mickle Asper, MD;  Location: MAIN OR [place];  Service: Gastrointestinal   ??? PR REPAIR RECURR INCIS HERNIA,STRANG N/A 01/24/2020    Procedure: REPAIR RECURRENT INCISIONAL OR VENTRAL HERNIA; INCARCERATED OR STRANGULATED;  Surgeon: [name], MD;  Location: MAIN OR [place];  Service: Trauma   ??? PR SIGMOIDOSCOPY,FINE NEEDL BX,US GUIDED N/A 01/09/2018    Procedure: SIGMOIDOSCOPY, FLEXIBLE, W/TRANSENDOSCOPIC ULTRASOUND GUIDED NEEDLE ASPIRATION;  Surgeon: Vonda Antigua, MD;  [facility]: [facility];  Service: Gastroenterology     Metastatic cancer:   Location: brain          ASSESSMENT/Reason for Referral:   46 y.o. male presents with longstanding Stage II facial and neck lymphedema. The patient has an extensive CA history, now with brain mets for which he received whole brain radiation.   He had swelling with his initial diagnosis and sought lymphedema treatments at that time.  His swelling has worsened over the years, and he notes lymphorrhea in the right side of his face at times.   He currently does not perform self care.   In addition to lymphedema , the patient is experiencing these side effects from cancer treatments: Radiation: Fibrosis/myofascial restrictions      Recommendations for treatment/garments:  1) Patient will obtain proper garments to address swelling and improve tissue integrity. Garment recommendations :Jovi pak Half face and neck mask for right side of face with universal neck pad  2) Pt will be seen clinically for rehabilitation to include treatments to address as needed: edema/lymphedema, pain, ROM, strength, balance and endurance deficits, as well as learning self care strategies to address these deficits  3) Pt will require a pneumatic compression pump because despite conservative treatment ZO:[id number] garments or devices, elevation, skin care and self MLD,the patient exhibits the following symptoms:hyperpigmentation, skin breakdown with lymphorrhea, fibrosis, progressive edema, chest/axillary swelling and unable to control swelling    Patient requires skilled Physical Therapy services  for the following problem list and secondary functional limitations:    Problem List:   Decreased knowledge of self care of lymphedema  Lack of appropriate compression garment  Increased risk of infections  Increased swelling     Secondary Functional Limitations:  Decreased knowledge of self care can lead to decreased skin integrity and put patient at risk for infections  Lack of a garment can cause increased swelling and increased risk of infection    Patient Goals: Decrease swelling and obtain a compression garment    Physical Therapy Goals:  In 1-3 months: Pt and/or caregiver will:  Have knowledge of proper skin care, lymphedema risk reduction strategies, self MLD, HEP, and compression garment/devices to utilize  Improve skin condition as evidenced by increased hydration, palpable decrease in fibrosis, decreased pitting, and increased scar mobility to improve ROM for functional use of neck/UE use, and to improve the ability for the area to decongest  Decrease swelling in the face and neck to reduce infection risk, improve overall appearance, and improve QOL      In 6-12 months: Pt and/or caregiver will:  Be independent with home management of self care related to lymphedema to reduce infection risk   Retain optimal reduction in swelling that allows pt to fit into appropriate compression garments, clothing, maintain a more normal appearance, and improve QOL        Prognosis for goal achievement:   Good due to previous successful treatment.      PLAN:   Within a 90 day certification period, schedule permitting, decreasing frequency as able, patient will participate in the following as needed: Manual lymphatic drainage, compression bandaging or use of reduction kits, appropriate garment recommendations, skin care and therapeutic exercise, manual therapy, low level laser therapy, lymphatouch, taping, ROM and strength training, education on posture and body mechanics,pump/equipment recommendations, balance exercises,endurance exercises, and strategies to assist with sensation deficits and pain reduction.      Planned frequency and duration  of treatment:   1 x week x 12 weeks    Next Visit Plan: check on garment and pump status, review self MLD, MLD, MFR, lymphatouch, taping   Pt to continue with current plan of care to educate in self care, precautions, and management of symptoms, optimal edema reduction/maintenance of girth, soft tissue changes, fitting of appropriate compression garments, and progression of exercises to optimize functional ROM, strength, balance and endurance in all ADL's(home, work, recreational, community)    SUBJECTIVE:  Patient???s communication preference: verbal, written, visual, prn   History of Present Illness/ Pt reports:  Longstanding swelling of face and neck, face leaks fluid at times   Location of pain: no pain    Cognitive Changes? no   Barriers to learning:    none    Difficulty coping with diagnosis, body image/financial concerns?  Informed patient of CCSP, pt would like referral:no       Home environment:     Home: lives alone, has a dog , [name]  Pt has caregiver available, capable and willing to help:no   Social History     Tobacco Use   ??? Smoking status: Never   ??? Smokeless tobacco: Never   Substance Use Topics   ??? Alcohol use: Yes     Comment: social drinker 3 times yearly       Occupation/ Activities/Recreation:        Occupational History   ??? Not on file     Exercise: none, used to be an Event organiser , now works as Science writer, desk job   Prior Functional Status:   Independent    Current Functional Limitations: The patient reports the following limitations based on verbal description and a FOTO/Neck Dissection Index  score of 71         Sexual Dysfunction or Bowel or Bladder  GI changes : not addressed     History of  skin/cellulitis infections?: no  Hospitalizations due to infections?no    Previous Lymphedema Treatment Type:  MLD, day garment and bandaging/CDT    Current management:   none  OBJECTIVE:      Posture/Observations:  Forward head, rounded shoulders , mild    Palpation: fibrosis right side of neck       Range of Motion/Flexibilty:   Cervical WFL  UE WFL     Jaw ROM: WFL     Strength/MMT:   Cervical WFL   UE WFL         Girth Measurements: taken in cm          Neck Composite measures:  DATE: 2-9        Superior (just below mandible)cB at 6 cm below ear  46.2          Middle (midway between top and bottom)cD at 9 cm below ear 43.5          Inferior (lowest circumferential location)cC at 12cm below ear 43.4          Tragus to tragus cA at cm in front of earlobe             Facial Measures:  DATE 2-9 2-9            Right Left  Right Left Right Left Right Left Right Left   Tragus to inner corner of eye F1   13 13.2             Tragus to outer corner of mouth F2   14 13.4             Tragus to mid chin F3   18 17.2             Mandibular angle outer corner of mouth F4                      Location of swelling:  right face ad bilateral neck    Skin condition:  increased skin thickness and radiation fibrosis  Incision/Scar:   reduced scar mobility   myofascial restrictions     Sensation :  Peripheral neuropathy: none  Light touch:     Decreased sensation  around scar,      Gait:  WNL    Balance:no LOB reported, pt states he occasionally trips over his dog    Endurance/Fatigue: WFL    DME needs related to safety: none    Total Treatment Time: 60 min      PT Evaluation: 30 min  Moderate    4+ systems evaluated: integumentary, circulatory/lymphatic, neurological, musculoskeletal, functional mobility  Data from the patient???s history and examination indicates the presence of 1-2 personal factors and/or comorbidities that will impact the plan of care for the current problem, including cancer history thyroid Examination of body systems includes 3  body structures, functions, activity limitations and/or participation restrictions including integumentary system, lymphatic system, cognitive function and functional mobility. The assessed clinical presentation is evolving based on clinical findings . These factors result in the need for moderate clinical decision making.    Treatment Rendered:    Patient and/or caregiver and therapist were mask compliant per current [place] COVID mask policy during the entire treatment session.      Self Care x 15 min   Patient Education:Pt and/or caregiver were educated regarding some or all of the following prn:  Lymphedema/edema etiology,Lymphedema/edema precautions, Indications/Contraindications to treatment, importance of therapy,treatment options and plan, self care management  to include skin care, self MLD, garment/equipment options and HEP, and community resources.  Patient and/or caregiver demonstrated and verbalized agreement and understanding. Given written information as needed.  Manual x 15 min   MLD to appropriate anastomoses in order to decrease swelling.  Application of compression garment/devices prn to prevent re-accumulation of fluid.        Equipment provided/recommended:   N/A    Communication/consultation with other professionals:  Email to DME re: garment needs [employer],  and Email to  compression pump rep re: pump needsFlexitouch head and neck pump    Referrals made to the following providers:  none    Medical Necessity: This treatment is medically necessary throughout the course of this patient's life during and after cancer treatments to improve functional activities and /or to minimize the decline of functional abilities and worsening of symptoms,including infection and recurrent hospitalizations, through independent/home management strategies.  Lymphedema, a chronic, progressive condition for which there is no cure, is marked by the accumulation of protein-rich fluid in one or more quadrants of the body due to primary or secondary disruption of the lymphatic system.  The sustained accumulation results in tissue inflammation, an increase in fatty tissue, and development of obstructive connective tissue.  These changes may result in an increased risk of infection, disfigurement and a decrease in mobility and functional performance.   Pt will benefit from physical therapy by a certified lymphedema therapist to address : education in self care, precautions, and management of symptoms, optimal edema reduction/maintenance of girth, optimal soft tissue changes, fitting of appropriate compression garments, and progression of exercises to optimize functional ROM and strength, balance, and endurance in all ADL's(home, work, recreational, community).  Attendance Policy:  I reviewed the no-show/attendance policy with the patient and caregiver(s). The patient/family is aware that they must call to cancel appointments more than 24 hours in advance. They are also aware that if they late cancel or no-show three times, we reserve the right to cancel their remaining appointments. This policy is in place to allow [place] to best serve the needs of our caseload.    I attest that I have reviewed the above information.  Signed: Clementeen Graham, PT   02/22/2021 7:07 PM

## 2021-02-27 DIAGNOSIS — I890 Lymphedema, not elsewhere classified: Principal | ICD-10-CM

## 2021-03-02 NOTE — Unmapped (Addendum)
[facility] THERAPY SERVICES [place]  OUTPATIENT PHYSICAL THERAPY  03/02/2021  Note Type: Treatment Note       Patient Name: Kenneth Fitzgerald [facility]  Date of Birth:February 10, 1975  Diagnosis:   Encounter Diagnoses   Name Primary?   ??? Lymphedema of face Yes   ??? Scar condition and fibrosis of skin    ??? Malignant melanoma, unspecified site (CMS-HCC)      Referring MD:  [name], MD     Date of Onset of Impairment-01/22/2021  Date PT Care Plan Established or Reviewed-02/22/2021  Date PT Treatment Started-02/22/2021   Plan of Care Effective Date:     Session #:  2 of 13      Contraindications:  none  Precautions:  hyper/hypothyroidism  thyroid cancer  Red Flags:  history of cancer melanoma and thyroid , brain mets     Metastatic cancer:   Location: brain    Assessment/Progress toward goals:  Pt is progressing toward meeting goals and Patient's skin integrity is improving.  Skin is softening with treatment which allows for improved lymph uptake, and decreased risk of infection Insurance will not pay for pump.  Awaiting call to schedule for garment.          ASSESSMENT/Reason for Referral:   46 y.o. male presents with longstanding Stage II facial and neck lymphedema. The patient has an extensive CA history, now with brain mets for which he received whole brain radiation.   He had swelling with his initial diagnosis and sought lymphedema treatments at that time.  His swelling has worsened over the years, and he notes lymphorrhea in the right side of his face at times.   He currently does not perform self care.   In addition to lymphedema , the patient is experiencing these side effects from cancer treatments: Radiation: Fibrosis/myofascial restrictions      Recommendations for treatment/garments:  1) Patient will obtain proper garments to address swelling and improve tissue integrity. Garment recommendations :Jovi pak Half face and neck mask for right side of face with universal neck pad  2) Pt will be seen clinically for rehabilitation to include treatments to address as needed: edema/lymphedema, pain, ROM, strength, balance and endurance deficits, as well as learning self care strategies to address these deficits  3) Pt will require a pneumatic compression pump because despite conservative treatment ZO:[id number] garments or devices, elevation, skin care and self MLD,the patient exhibits the following symptoms:hyperpigmentation, skin breakdown with lymphorrhea, fibrosis, progressive edema, chest/axillary swelling and unable to control swelling    Patient requires skilled Physical Therapy services  for the following problem list and secondary functional limitations:    Problem List:   Decreased knowledge of self care of lymphedema  Lack of appropriate compression garment  Increased risk of infections  Increased swelling     Secondary Functional Limitations:  Decreased knowledge of self care can lead to decreased skin integrity and put patient at risk for infections  Lack of a garment can cause increased swelling and increased risk of infection    Patient Goals: Decrease swelling and obtain a compression garment    Physical Therapy Goals:   In 1-3 months: Pt and/or caregiver will:  Have knowledge of proper skin care, lymphedema risk reduction strategies, self MLD, HEP, and compression garment/devices to utilize MET 03-02-21  Improve skin condition as evidenced by increased hydration, palpable decrease in fibrosis, decreased pitting, and increased scar mobility to improve ROM for functional use of neck/UE use, and to improve the ability for the  area to decongest  Decrease swelling in the face and neck to reduce infection risk, improve overall appearance, and improve QOL      In 6-12 months: Pt and/or caregiver will:  Be independent with home management of self care related to lymphedema to reduce infection risk   Retain optimal reduction in swelling that allows pt to fit into appropriate compression garments, clothing, maintain a more normal appearance, and improve QOL              PLAN:     Planned frequency and duration  of treatment:   1 x week x 12 weeks    Next Visit Plan: check on garment and pump status, review self MLD, MLD, MFR, lymphatouch, taping         SUBJECTIVE:  Patient???s communication preference: verbal, written, visual, prn   History of Present Illness/ Pt reports:  Pump was denied by [employer], haven't heard back yet about garment   Location of pain: no pain        OBJECTIVE:      Posture/Observations:  Forward head, rounded shoulders , mild    Palpation: fibrosis right side of neck       Range of Motion/Flexibilty:   Cervical WFL  UE WFL     Jaw ROM: WFL     Strength/MMT:   Cervical WFL   UE WFL         Girth Measurements: taken in cm          Neck Composite measures:  DATE: 2-9        Superior (just below mandible)cB at 6 cm below ear  46.2          Middle (midway between top and bottom)cD at 9 cm below ear 43.5          Inferior (lowest circumferential location)cC at 12cm below ear 43.4          Tragus to tragus cA at cm in front of earlobe             Facial Measures:  DATE 2-9 2-9            Right Left  Right Left Right Left Right Left Right Left   Tragus to inner corner of eye F1   13 13.2             Tragus to outer corner of mouth F2   14 13.4             Tragus to mid chin F3   18 17.2             Mandibular angle outer corner of mouth F4                      Location of swelling:  right face ad bilateral neck    Skin condition:  increased skin thickness and radiation fibrosis  Incision/Scar:   reduced scar mobility   myofascial restrictions                 Total Treatment Time: 45 min ( pt arrived late)    Treatment Rendered:    Patient and/or caregiver and therapist were mask compliant per current [place] COVID mask policy during the entire treatment session. Pt did not wear mask so that MLD could be performed on his face.     Manual x 45 min   Myofascial release/Spontaneous Muscle Release Techniques, STM to affected areas , Lymphatouch to affected areas  with simultaneous MFR and MLD to appropriate anastomoses in order to decrease swelling.  Application of compression garment/devices prn to prevent re-accumulation of fluid.            Equipment provided/recommended:   N/A    Communication/consultation with other professionals:  N/A    Referrals made to the following providers:  none    Medical Necessity: This treatment is medically necessary throughout the course of this patient's life during and after cancer treatments to improve functional activities and /or to minimize the decline of functional abilities and worsening of symptoms,including infection and recurrent hospitalizations, through independent/home management strategies.  Lymphedema, a chronic, progressive condition for which there is no cure, is marked by the accumulation of protein-rich fluid in one or more quadrants of the body due to primary or secondary disruption of the lymphatic system.  The sustained accumulation results in tissue inflammation, an increase in fatty tissue, and development of obstructive connective tissue.  These changes may result in an increased risk of infection, disfigurement and a decrease in mobility and functional performance.   Pt will benefit from physical therapy by a certified lymphedema therapist to address : education in self care, precautions, and management of symptoms, optimal edema reduction/maintenance of girth, optimal soft tissue changes, fitting of appropriate compression garments, and progression of exercises to optimize functional ROM and strength, balance, and endurance in all ADL's(home, work, recreational, community).        I attest that I have reviewed the above information.  Signed: Clementeen Graham, PT   03/02/2021 9:40 AM

## 2021-03-05 ENCOUNTER — Telehealth: Admit: 2021-03-05 | Discharge: 2021-03-06 | Payer: PRIVATE HEALTH INSURANCE | Attending: Clinical | Primary: Clinical

## 2021-03-05 DIAGNOSIS — F330 Major depressive disorder, recurrent, mild: Principal | ICD-10-CM

## 2021-03-05 NOTE — Unmapped (Signed)
[facility]  Psychiatry  Psychotherapy Note - Telehealth via??Video  ??  Service Date:??February 20,??2023  Service:??40??minutes of??psychotherapy via??video??session  Time with Patient:??40??minutes  ??  Encounter Description:??This encounter was conducted from provider's office via EPIC video session due to COVID-19 pandemic. Arleigh Odowd was located in his home.??Session was conducted via EPIC.????Rationale for??video session??is need to social distance. See Plan for telemedicine consent/disclaimer.   ??  Encounter Description/Consent:??  [name] visit was completed through telehealth encounter (video).   ??  This patient encounter is appropriate and reasonable under the circumstances. The patient has been advised of the potential risks and limitations of this mode of treatment (including, but not limited to, the absence of in-person examination) and has agreed to be treated in a remote fashion in spite of them. Any and all of the patient's/patient's family's questions on this issue have been answered.   ??  The patient was physically located in [place] in which I am permitted to provide care. The patient??understood that??he may incur co-pays and cost sharing, and agreed to the telemedicine visit. The visit was??reasonable and appropriate under the circumstances given the patient's presentation at the time.   ??  Time Spent: 40??minutes  ??  Assessment:  Mr. Seliga a 46yo??male??with metastatic melanoma, h/o thyroid cancer, recent portal vein thrombosis, who I see in therapy to address depression and enhance coping with??multiple??stressors.?? I met with  Mr. Leach today via telehealth.  Mr. Harbaugh endorsed mild-to-moderate distress in coping with medical, and especially, legal stressors.  He has some anticipatory anxiety ahead of next month's scans.  I??offered psychotherapy, support,??and recommendations??for addressing anxiety and mood concerns.????Will see next??in three??weeks.  ??  Risk Assessment:  A suicide and violence risk assessment was performed as part of this evaluation. There patient is deemed to be at chronic elevated risk??for self-harm/suicide given the following factors: current diagnosis of depression, suicidal ideation or threats without a plan and past diagnosis of depression. There patient is deemed to be at chronic elevated risk??for violence given the following factors: male gender. These risk factors are mitigated by the following factors: lack of active SI/HI. There is no acute risk for suicide or violence at this time. The patient was educated about relevant modifiable risk factors including following recommendations for treatment of psychiatric illness and abstaining from substance abuse.??While future psychiatric events cannot be accurately predicted, the patient does not currently require acute inpatient psychiatric care and does not currently meet [facility] involuntary commitment criteria.??????????????????  ??  Plan:  1.??I will continue to see pt for psychotherapy. ??Next session??is??scheduled??to take place in three weeks.  2.??Mr. Montesinos??has??information on available [facility]SP resources.??Pt is working with [name], [name]. ??I have??previously??reached out to [name] to explore financial resources.  3.??Mr. Ortez continues to take Remeron.  4. Pt has??my contact information and knows??to reach me as needed.  ??  Subjective:??  Mr. Mazo??presented as alert,??oriented, and he was engaged.????He was not visibly anxious or in distress during our session.    Mr. Peine reported no significant psychosocial changes since our session last month. 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 It was the latest reminder of the long lasting tentacles from that case.  Related to this, he is still awaiting word on whether and when the civil case against him will proceed.  Mr. Merriweather again described how this -  along with his medical status - affects him and impacts his attempts to reimagine his life moving forward.  He is planning a bike trip to [place] next month with a friend, which he is excited about.  Mr. Recker expressed some anticipatory anxiety ahead of next month's scans.  ??  I provided Mr. Adkins support and understanding, and he utilized our session to process his thoughts,??feelings, and experiences??- particularly those related to legal and medical stressors.?? We??reviewed and??problem-solved steps that he can take to address distress - and, importantly, tolerate??- the??distressing aspects of a situation that he cannot control.????Mr.??Arel was??engaged during our session as usual??and my sense is that he benefits from therapy. ??We will meet next in three weeks.  ??  Mental Status Exam:  Appearance:  ?? ??Appears stated age   Motor: ?? ??No abnormal movements   Speech/Language:  ?? ??Normal rate, volume, tone, fluency   Mood: ?? about the same   Affect: ?? ??engaged   Thought process: ?? ??Logical, linear, clear, coherent, goal-directed   Thought content: ?? ?? ??Denies SI/HI or thought of self-harm   Perceptual disturbances: ?? ?? ??Behavior not concerning for response to internal stimuli  ??   Orientation: ?? ??Oriented to person, place, time, and general circumstances   Attention: ?? ??Able to fully attend without fluctuations in consciousness   Concentration: ?? ??Able to fully concentrate and attend   Memory: ?? ??Immediate, short-term, long-term, and recall grossly intact   ??Fund of knowledge:  ?? ??Consistent with level of education and development   Insight: ?? ?? ??Intact   Judgment:  ?? ??Intact   Impulse Control: ?? ??Intact   ??  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  [name]. Somya Jauregui PhD  February 20,??2023

## 2021-03-14 NOTE — Unmapped (Signed)
[facility] Refill Coordination Note    Specialty Medication(s) to be Shipped:   Hematology/Oncology: Kenneth Fitzgerald and [name]    Other medication(s) to be shipped: Sulfasalazine     Kenneth Fitzgerald [place], DOB: June 22, 1975  Phone: [phone] (home)       All above HIPAA information was verified with patient.     Was a Nurse, learning disability used for this call? No    Completed refill call assessment today to schedule patient's medication shipment from the [facility] [phone]).  All relevant notes have been reviewed.     Specialty medication(s) and dose(s) confirmed: Regimen is correct and unchanged.   Changes to medications: Hanzel reports starting the following medications: Terbinafine  Changes to insurance: No  New side effects reported not previously addressed with a pharmacist or physician: None reported  Questions for the pharmacist: No    Confirmed patient received a Conservation officer, historic buildings and a Surveyor, mining with first shipment. The patient will receive a drug information handout for each medication shipped and additional FDA Medication Guides as required.       DISEASE/MEDICATION-SPECIFIC INFORMATION        N/A    SPECIALTY MEDICATION ADHERENCE     Medication Adherence    Patient reported X missed doses in the last month: 1  Specialty Medication: Braftovi 75mg   Patient is on additional specialty medications: Yes  Additional Specialty Medications: [name] 15mg   Patient Reported Additional Medication X Missed Doses in the Last Month: 1  Patient is on more than two specialty medications: No  Informant: patient              Were doses missed due to medication being on hold? No    Braftovi 75 mg: 14 days of medicine on hand   [name] 15 mg: 14 days of medicine on hand       REFERRAL TO PHARMACIST     Referral to the pharmacist: Not needed      [facility]     Shipping address confirmed in Epic.     Delivery Scheduled: Yes, Expected medication delivery date: 03/28/21.     Medication will be delivered via UPS to the prescription address in Epic [place].    Kenneth Fitzgerald   [facility] Specialty Technician

## 2021-03-15 NOTE — Unmapped (Signed)
[facility]ERAP[facility] SERVICES [place]  OUTPATIENT PHYSICAL THERAPY  03/15/2021  Note Type: Treatment Note       Patient Name: [facility]  Date of Birth:1975-02-26  Diagnosis:   Encounter Diagnoses   Name Primary?   ??? Lymphedema of face Yes   ??? Scar condition and fibrosis of skin    ??? Malignant melanoma, unspecified site (CMS-HCC)      Referring MD:  [name], MD     Date of Onset of Impairment-01/22/2021  Date PT Care Plan Established or Reviewed-02/22/2021  Date PT Treatment Started-02/22/2021   Plan of Care Effective Date:     Session #:  3 of 13      Contraindications:  none  Precautions:  hyper/hypothyroidism  thyroid cancer  Red Flags:  history of cancer melanoma and thyroid , brain mets     Metastatic cancer:   Location: brain    Assessment/Progress toward goals:  Pt is progressing toward meeting goals and Patient's skin integrity is improving.  Skin is softening with treatment which allows for improved lymph uptake, and decreased risk of infection Insurance will not pay for pump. Pt is applying to get a free/reduced price pump.  Has an appointment in a few weeks to be measured for a garment.           ASSESSMENT/Reason for Referral:   46 y.o. male presents with longstanding Stage II facial and neck lymphedema. The patient has an extensive CA history, now with brain mets for which he received whole brain radiation.   He had swelling with his initial diagnosis and sought lymphedema treatments at that time.  His swelling has worsened over the years, and he notes lymphorrhea in the right side of his face at times.   He currently does not perform self care.   In addition to lymphedema , the patient is experiencing these side effects from cancer treatments: Radiation: Fibrosis/myofascial restrictions      Recommendations for treatment/garments:  1) Patient will obtain proper garments to address swelling and improve tissue integrity. Garment recommendations :Jovi pak Half face and neck mask for right side of face with universal neck pad  2) Pt will be seen clinically for rehabilitation to include treatments to address as needed: edema/lymphedema, pain, ROM, strength, balance and endurance deficits, as well as learning self care strategies to address these deficits  3) Pt will require a pneumatic compression pump because despite conservative treatment ZO:[id number] garments or devices, elevation, skin care and self MLD,the patient exhibits the following symptoms:hyperpigmentation, skin breakdown with lymphorrhea, fibrosis, progressive edema, chest/axillary swelling and unable to control swelling    Patient requires skilled Physical Therapy services  for the following problem list and secondary functional limitations:    Problem List:   Decreased knowledge of self care of lymphedema  Lack of appropriate compression garment  Increased risk of infections  Increased swelling     Secondary Functional Limitations:  Decreased knowledge of self care can lead to decreased skin integrity and put patient at risk for infections  Lack of a garment can cause increased swelling and increased risk of infection    Patient Goals: Decrease swelling and obtain a compression garment    Physical Therapy Goals:   In 1-3 months: Pt and/or caregiver will:    Improve skin condition as evidenced by increased hydration, palpable decrease in fibrosis, decreased pitting, and increased scar mobility to improve ROM for functional use of neck/UE use, and to improve the ability for the area to  decongest MET 03-15-21  Decrease swelling in the face and neck to reduce infection risk, improve overall appearance, and improve QOL      In 6-12 months: Pt and/or caregiver will:  Be independent with home management of self care related to lymphedema to reduce infection risk   Retain optimal reduction in swelling that allows pt to fit into appropriate compression garments, clothing, maintain a more normal appearance, and improve QOL              PLAN:     Planned frequency and duration  of treatment:   1 x week x 12 weeks    Next Visit Plan: check on garment and pump status,  MLD, MFR, lymphatouch, taping prn        SUBJECTIVE:  Patient???s communication preference: verbal, written, visual, prn   History of Present Illness/ Pt reports:  Has an appointment to get measured for facial garment, applying for reduced/free pump   Did self massage 3 x last week, busy at work   Location of pain: no pain        OBJECTIVE:      Posture/Observations:  Forward head, rounded shoulders , mild    Palpation: fibrosis right side of neck       Range of Motion/Flexibilty:   Cervical WFL  UE WFL     Jaw ROM: WFL     Strength/MMT:   Cervical WFL   UE WFL         Girth Measurements: taken in cm          Neck Composite measures:  DATE: 2-9        Superior (just below mandible)cB at 6 cm below ear  46.2          Middle (midway between top and bottom)cD at 9 cm below ear 43.5          Inferior (lowest circumferential location)cC at 12cm below ear 43.4          Tragus to tragus cA at cm in front of earlobe             Facial Measures:  DATE 2-9 2-9            Right Left  Right Left Right Left Right Left Right Left   Tragus to inner corner of eye F1   13 13.2             Tragus to outer corner of mouth F2   14 13.4             Tragus to mid chin F3   18 17.2             Mandibular angle outer corner of mouth F4                      Location of swelling:  right face ad bilateral neck    Skin condition:  increased skin thickness and radiation fibrosis  Incision/Scar:   reduced scar mobility   myofascial restrictions                 Total Treatment Time: 53    Treatment Rendered:    Patient and/or caregiver and therapist were mask compliant per current [place] COVID mask policy during the entire treatment session. Pt did not wear mask so that MLD could be performed on his face.     Manual x 53 min   Myofascial release/Spontaneous Muscle Release Techniques, STM to affected  areas , Lymphatouch to affected areas with simultaneous MFR and MLD to appropriate anastomoses in order to decrease swelling.  Application of compression garment/devices prn to prevent re-accumulation of fluid.            Equipment provided/recommended:   N/A    Communication/consultation with other professionals:  N/A    Referrals made to the following providers:  none    Medical Necessity: This treatment is medically necessary throughout the course of this patient's life during and after cancer treatments to improve functional activities and /or to minimize the decline of functional abilities and worsening of symptoms,including infection and recurrent hospitalizations, through independent/home management strategies.  Lymphedema, a chronic, progressive condition for which there is no cure, is marked by the accumulation of protein-rich fluid in one or more quadrants of the body due to primary or secondary disruption of the lymphatic system.  The sustained accumulation results in tissue inflammation, an increase in fatty tissue, and development of obstructive connective tissue.  These changes may result in an increased risk of infection, disfigurement and a decrease in mobility and functional performance.   Pt will benefit from physical therapy by a certified lymphedema therapist to address : education in self care, precautions, and management of symptoms, optimal edema reduction/maintenance of girth, optimal soft tissue changes, fitting of appropriate compression garments, and progression of exercises to optimize functional ROM and strength, balance, and endurance in all ADL's(home, work, recreational, community).        I attest that I have reviewed the above information.  Signed: Clementeen Graham, PT   03/15/2021 9:34 AM

## 2021-03-26 ENCOUNTER — Ambulatory Visit: Admit: 2021-03-26 | Payer: PRIVATE HEALTH INSURANCE | Attending: Clinical | Primary: Clinical

## 2021-03-27 MED FILL — BRAFTOVI 75 MG CAPSULE: ORAL | 30 days supply | Qty: 180 | Fill #5

## 2021-03-27 MED FILL — MEKTOVI 15 MG TABLET: ORAL | 30 days supply | Qty: 180 | Fill #5

## 2021-03-27 MED FILL — SULFASALAZINE 500 MG TABLET: ORAL | 30 days supply | Qty: 120 | Fill #0

## 2021-03-30 ENCOUNTER — Ambulatory Visit
Admit: 2021-03-30 | Discharge: 2021-04-22 | Payer: PRIVATE HEALTH INSURANCE | Attending: Rehabilitative and Restorative Service Providers" | Primary: Rehabilitative and Restorative Service Providers"

## 2021-03-30 ENCOUNTER — Ambulatory Visit
Admit: 2021-03-30 | Payer: PRIVATE HEALTH INSURANCE | Attending: Rehabilitative and Restorative Service Providers" | Primary: Rehabilitative and Restorative Service Providers"

## 2021-03-30 ENCOUNTER — Ambulatory Visit
Admit: 2021-03-30 | Discharge: 2021-03-31 | Payer: PRIVATE HEALTH INSURANCE | Attending: Rehabilitative and Restorative Service Providers" | Primary: Rehabilitative and Restorative Service Providers"

## 2021-03-31 NOTE — Unmapped (Signed)
[facility] THERAPY SERVICES [place]  OUTPATIENT PHYSICAL THERAPY  03/30/2021  Note Type: Treatment Note       Patient Name: [facility]  Date of Birth:[birth year]  Diagnosis:   Encounter Diagnoses   Name Primary?   ??? Lymphedema of face Yes   ??? Scar condition and fibrosis of skin    ??? Malignant melanoma, unspecified site (CMS-HCC)      Referring MD:  [name], MD     Date of Onset of Impairment-01/22/2021  Date PT Care Plan Established or Reviewed-02/22/2021  Date PT Treatment Started-02/22/2021   Plan of Care Effective Date:     Session #:  4 of 13      Contraindications:  none  Precautions:  hyper/hypothyroidism  thyroid cancer  Red Flags:  history of cancer melanoma and thyroid , brain mets     Metastatic cancer:   Location: brain    Assessment/Progress toward goals:  Pt is progressing toward meeting goals and Patient's skin integrity is improving.  Skin is softening with treatment which allows for improved lymph uptake, and decreased risk of infection Patient is getting measured for facial garment later today, has applied for pump assistance with Flexitouch.  Left scapular pain reduced post treatment.        ASSESSMENT/Reason for Referral:   46 y.o. male presents with longstanding Stage II facial and neck lymphedema. The patient has an extensive CA history, now with brain mets for which he received whole brain radiation.   He had swelling with his initial diagnosis and sought lymphedema treatments at that time.  His swelling has worsened over the years, and he notes lymphorrhea in the right side of his face at times.   He currently does not perform self care.   In addition to lymphedema , the patient is experiencing these side effects from cancer treatments: Radiation: Fibrosis/myofascial restrictions      Recommendations for treatment/garments:  1) Patient will obtain proper garments to address swelling and improve tissue integrity. Garment recommendations :Jovi pak Half face and neck mask for right side of face with universal neck pad  2) Pt will be seen clinically for rehabilitation to include treatments to address as needed: edema/lymphedema, pain, ROM, strength, balance and endurance deficits, as well as learning self care strategies to address these deficits  3) Pt will require a pneumatic compression pump because despite conservative treatment ZO:[id number] garments or devices, elevation, skin care and self MLD,the patient exhibits the following symptoms:hyperpigmentation, skin breakdown with lymphorrhea, fibrosis, progressive edema, chest/axillary swelling and unable to control swelling    Patient requires skilled Physical Therapy services  for the following problem list and secondary functional limitations:    Problem List:   Decreased knowledge of self care of lymphedema  Lack of appropriate compression garment  Increased risk of infections  Increased swelling     Secondary Functional Limitations:  Decreased knowledge of self care can lead to decreased skin integrity and put patient at risk for infections  Lack of a garment can cause increased swelling and increased risk of infection    Patient Goals: Decrease swelling and obtain a compression garment    Physical Therapy Goals:   In 1-3 months: Pt and/or caregiver will:    Decrease swelling in the face and neck to reduce infection risk, improve overall appearance, and improve QOL      In 6-12 months: Pt and/or caregiver will:  Be independent with home management of self care related to lymphedema to reduce infection risk  Retain optimal reduction in swelling that allows pt to fit into appropriate compression garments, clothing, maintain a more normal appearance, and improve QOL              PLAN:     Planned frequency and duration  of treatment:   1 x week x 12 weeks    Next Visit Plan: Take circumference, check on garment and pump status,  MLD, MFR, lymphatouch, taping prn        SUBJECTIVE:  Patient???s communication preference: verbal, written, visual, prn History of Present Illness/ Pt reports:  Went on vacation last week, not much self care.  Noting some increased pain left scapular area    Location of pain: no pain        OBJECTIVE:      Posture/Observations:  Forward head, rounded shoulders , mild    Palpation: fibrosis right side of neck       Range of Motion/Flexibilty:   Cervical WFL  UE WFL     Jaw ROM: WFL     Strength/MMT:   Cervical WFL   UE WFL         Girth Measurements: taken in cm          Neck Composite measures:  DATE: 2-9        Superior (just below mandible)cB at 6 cm below ear  46.2          Middle (midway between top and bottom)cD at 9 cm below ear 43.5          Inferior (lowest circumferential location)cC at 12cm below ear 43.4          Tragus to tragus cA at cm in front of earlobe             Facial Measures:  DATE 2-9 2-9            Right Left  Right Left Right Left Right Left Right Left   Tragus to inner corner of eye F1   13 13.2             Tragus to outer corner of mouth F2   14 13.4             Tragus to mid chin F3   18 17.2             Mandibular angle outer corner of mouth F4                      Location of swelling:  right face ad bilateral neck    Skin condition:  increased skin thickness and radiation fibrosis  Incision/Scar:   reduced scar mobility   myofascial restrictions                 Total Treatment Time: 53    Treatment Rendered:    Patient and/or caregiver and therapist were mask compliant per current [place] COVID mask policy during the entire treatment session. Pt did not wear mask so that MLD could be performed on his face.     Manual x 53 min   Myofascial release/Spontaneous Muscle Release Techniques, STM to affected areas , Lymphatouch to affected areas with simultaneous MFR and MLD to appropriate anastomoses in order to decrease swelling.  Application of compression garment/devices prn to prevent re-accumulation of fluid. DFM to left scapula             Equipment provided/recommended:   N/A    Communication/consultation with other professionals:  N/A    Referrals made to the following providers:  none    Medical Necessity: This treatment is medically necessary throughout the course of this patient's life during and after cancer treatments to improve functional activities and /or to minimize the decline of functional abilities and worsening of symptoms,including infection and recurrent hospitalizations, through independent/home management strategies.  Lymphedema, a chronic, progressive condition for which there is no cure, is marked by the accumulation of protein-rich fluid in one or more quadrants of the body due to primary or secondary disruption of the lymphatic system.  The sustained accumulation results in tissue inflammation, an increase in fatty tissue, and development of obstructive connective tissue.  These changes may result in an increased risk of infection, disfigurement and a decrease in mobility and functional performance.   Pt will benefit from physical therapy by a certified lymphedema therapist to address : education in self care, precautions, and management of symptoms, optimal edema reduction/maintenance of girth, optimal soft tissue changes, fitting of appropriate compression garments, and progression of exercises to optimize functional ROM and strength, balance, and endurance in all ADL's(home, work, recreational, community).        I attest that I have reviewed the above information.  Signed: Clementeen Graham, PT   03/30/2021 7:17 AM

## 2021-04-02 NOTE — Unmapped (Signed)
Lymphedema Initial Evaluation      HPI   SUBJECTIVE:   Kenneth Fitzgerald is a 46 y.o. male who presents for evaluation of compression garments for the face/head. He has lymphedema of the face/neck area, being seen by physical therapy.      History     Current Outpatient Medications on File Prior to Visit   Medication Sig Dispense Refill   ??? acetaminophen (TYLENOL) 500 MG tablet Take 2 tablets (1,000 mg total) by mouth every eight (8) hours as needed for pain. 30 tablet 0   ??? binimetinib ([name]) 15 mg tablet Take 3 tablets (45 mg total) by mouth Two (2) times a day. 180 tablet 11   ??? blood sugar diagnostic Strp Dispense 100 blood glucose test strips, ok to sub any brand preferred by insurance/patient, use 3x/day; dispense whatever brand matches with meter. 100 strip 12   ??? blood-glucose meter kit Use as instructed; dispense 1 meter, whatever is preferred by insurance 1 each 1   ??? calcitrioL (ROCALTROL) 0.25 MCG capsule Take 1 capsule (0.25 mcg total) by mouth daily. 90 capsule 3   ??? calcium carbonate 650 mg calcium (1,625 mg) tablet Take 1 tablet by mouth Three (3) times a day with a meal.     ??? DULoxetine (CYMBALTA) 30 MG capsule Take 1 capsule (30 mg total) by mouth Two (2) times a day. 180 capsule 2   ??? encorafenib (BRAFTOVI) 75 mg capsule Take 6 capsules (450 mg total) by mouth daily. 180 capsule 11   ??? fexofenadine (ALLEGRA ALLERGY) 180 MG tablet Take 180 mg by mouth daily as needed (allergies).      ??? fluticasone propionate (FLONASE) 50 mcg/actuation nasal spray 2 sprays into each nostril two (2) times a day. 16 g 6   ??? folic acid (FOLVITE) 1 MG tablet TAKE 1 TABLET BY MOUTH EVERY DAY 90 tablet 3   ??? ibuprofen (ADVIL,MOTRIN) 200 MG tablet Take 600 mg by mouth as needed in the morning for pain.     ??? lancets Misc Dispense 100 lancets, ok to sub any brand preferred by insurance/patient, use 3x/day 100 each 12   ??? levothyroxine (SYNTHROID) 125 MCG tablet Take 2 tablets (250 mcg total) every Sunday (ie 1 day per week). Take 3 tablets (375 mcg total) on Monday-Saturday (ie 6 days per week). (Patient taking differently: Take 375 mcg by mouth daily.) 270 tablet 3   ??? memantine (NAMENDA) 10 MG tablet Take 10 mg by mouth daily.     ??? memantine (NAMENDA) 10 MG tablet TAKE 1 TABLET BY MOUTH TWO TIMES A DAY. 180 tablet 4   ??? mirtazapine (REMERON) 7.5 MG tablet Take 1 tablet (7.5 mg total) by mouth nightly. 90 tablet 3   ??? ofloxacin (OCUFLOX) 0.3 % ophthalmic solution Instill 4 drops in left ear twice daily for 5 days 5 mL 3   ??? ondansetron (ZOFRAN) 8 MG tablet Take 1 tablet by mouth Take as directed.     ??? sulfaSALAzine (AZULFIDINE) 500 mg tablet Take 2 tablets (1,000 mg total) by mouth two (2) times a day. 120 tablet 5     No current facility-administered medications on file prior to visit.       Past Medical History:   Diagnosis Date   ??? Cancer (CMS-HCC)     melanoma, thyroid cancer   ??? Disease of thyroid gland     hypothyroid   ??? HL (hearing loss)    ??? Hypothyroidism    ??? Skin  cancer        Past Surgical History:   Procedure Laterality Date   ??? PR COLONOSCOPY W/BIOPSY SINGLE/MULTIPLE N/A 01/09/2018    Procedure: COLONOSCOPY, FLEXIBLE, PROXIMAL TO SPLENIC FLEXURE; WITH BIOPSY, SINGLE OR MULTIPLE;  Surgeon: Vonda Antigua, MD;  [facility];  Service: Gastroenterology   ??? PR COLONOSCOPY W/BIOPSY SINGLE/MULTIPLE N/A 08/24/2019    Procedure: COLONOSCOPY, FLEXIBLE, PROXIMAL TO SPLENIC FLEXURE; WITH BIOPSY, SINGLE OR MULTIPLE;  Surgeon: Leland Her, MD;  [facility];  Service: Gastroenterology   ??? PR COLSC FLX WITH DIRECTED SUBMUCOSAL NJX ANY SBST N/A 08/24/2019    Procedure: COLONOSCOPY, FLEXIBLE, PROXIMAL TO SPLENIC FLEXURE; WITH DIRECTED SUBMUCOSAL INJECTION(S), ANY SUBSTANCE;  Surgeon: Leland Her, MD;  Location: [facility];  Service: Gastroenterology   ??? PR EXPLORATORY OF ABDOMEN N/A 01/24/2020    Procedure: EXPLORATORY LAPAROTOMY, EXPLORATORY CELIOTOMY WITH OR WITHOUT BIOPSY(S);  Surgeon: [name], MD;  Location: MAIN OR [facility];  Service: Trauma   ??? PR FREEING BOWEL ADHESION,ENTEROLYSIS N/A 01/24/2020    Procedure: ENTEROLYSIS (SEPART PROC);  Surgeon: [name], MD;  Location: MAIN OR [facility];  Service: Trauma   ??? PR IMPLANT MESH HERNIA REPAIR/DEBRIDEMENT CLOSURE N/A 01/03/2015    Procedure: IMPLANTATION OF MESH/OTHER PROSTHES INCISION/VENTRAL HERNIA REPAIR/MESH CLOSE DEBRID NECROT SOFT TIS INFECT;  Surgeon: Romero Belling, MD;  Location: MAIN OR [place];  Service: Gastrointestinal   ??? PR LAP, VENTRAL HERNIA REPAIR,REDUCIBLE N/A 04/07/2014    Procedure: LAPAROSCOPY, SURGICAL, REPAIR, VENTRAL, UMBILICAL, SPIGELIAN OR EPIGASTRIC HERNIA, REDUCIBLE;  Surgeon: Romero Belling, MD;  Location: MAIN OR [place];  Service: Gastrointestinal   ??? PR NEGATIVE PRESSURE WOUND THERAPY DME >50 SQ CM N/A 01/24/2020    Procedure: NEG PRESS WOUND TX (VAC ASSIST) INCL TOPICALS, PER SESSION, TSA GREATER THAN/= 50 CM SQUARED;  Surgeon: [name], MD;  Location: MAIN OR [place];  Service: Trauma   ??? PR REMOVAL NODES, NECK,CERV CMPLT Left 12/07/2012    Procedure: CERVICAL LYMPHADENECTOMY (COMPLETE);  Surgeon: Charlott Rakes, MD;  Location: MAIN OR [facility];  Service: Surgical Oncology   ??? PR REMOVAL NODES, NECK,CERV MOD RAD Right 03/29/2013    Procedure: CERVICAL LYMPHADENECTOMY (MODIFIED RADICAL NECK DISSECTION);  Surgeon: Charlott Rakes, MD;  Location: MA[facility] OR [facility];  Service: Surgical Oncology   ??? PR REPAIR RECURR INCIS HERNIA,REDUC N/A 01/03/2015    Procedure: REPAIR RECURRENT INCISIONAL OR VENTRAL HERNIA; REDUCIBLE;  Surgeon: Romero Belling, MD;  Location: MAIN OR [place];  Service: Gastrointestinal   ??? PR REPAIR RECURR INCIS HERNIA,STRANG N/A 06/21/2016    Procedure: REPAIR RECURRENT INCISIONAL OR VENTRAL HERNIA; INCARCERATED OR STRANGULATED;  Surgeon: Mickle Asper, MD;  Location: MAIN OR [place];  Service: Gastrointestinal   ??? PR REPAIR RECURR INCIS HERNIA,STRANG N/A 01/24/2020    Procedure: REPAIR RECURRENT INCISIONAL OR VENTRAL HERNIA; INCARCERATED OR STRANGULATED;  Surgeon: [name], MD;  Location: MAIN OR [place];  Service: Trauma   ??? PR SIGMOIDOSCOPY,FINE NEEDL BX,US GUIDED N/A 01/09/2018    Procedure: SIGMOIDOSCOPY, FLEXIBLE, W/TRANSENDOSCOPIC ULTRASOUND GUIDED NEEDLE ASPIRATION;  Surgeon: Vonda Antigua, MD;  [facility];  Service: Gastroenterology       Family History   Problem Relation Age of Onset   ??? Hyperthyroidism Mother    ??? Osteoporosis Mother    ??? Arrhythmia Mother    ??? Squamous cell carcinoma Mother         basal cell vs squamous cell skin cancer   ???  Coronary artery disease Father         s/p CABG   ??? Diabetes Father    ??? Hypertension Father    ??? Prostate cancer Father    ??? Colon cancer Paternal Grandmother    ??? Thyroid disease Neg Hx        Social History     Tobacco Use   ??? Smoking status: Never   ??? Smokeless tobacco: Never   Vaping Use   ??? Vaping Use: Never used   Substance Use Topics   ??? Alcohol use: Yes     Comment: social drinker 3 times yearly   ??? Drug use: No       History of O&P Care:  none    Physical Exam     Nursing notes and vital signs reviewed   none  Appearance:  Good    Skin condition:  Excellent    Location of swelling:  Face/neck     Sensation:  intact    Face  Level Circumference   Neck 18 in   Chin to Crown 28 in   Crown 23.5             Actions taken today     Assessment and Shape Capture: Measurements for half face and neck mask     Garment Number:  1    Design:  []  Custom  [x]  Prefabricated JoviPak half face and neck mask      Need for Custom: []  Yes [x]  No    Mark all that apply      Could not be fit with a pre-fabricated garment []  Yes [x]  No    Has used prefabricated garments unsuccessfully in the past []  Yes []  No    Condition expected to be long-standing in nature []  Yes []  No    Custom design is needed to prevent tissue injury []  Yes []  No      Modifications expected to custom or prefabricated device   Size  []     Alignment []     Other []       Any additions or modifications to the standard: none      Goals     Manage lymphedema  Wear garments daily    Assessment   Assessment: Mr. Mousseau as seen and measured without incident. He would benefit from the compression garments to reduce swelling and discomfort to the face and head.      Counseled patient about device recommendations, timelines, prescription, authorization process and follow up.  Patient expressed understanding    []  New Patient Info provided   []  Medicare supplier standards provided  Plan    Return Visit:  Once arrived     Authorization expectations:      will request authorization    Ordering:    All items have been ordered in preparation for next visit    [employer]:

## 2021-04-04 ENCOUNTER — Ambulatory Visit
Admit: 2021-04-04 | Discharge: 2021-04-05 | Payer: PRIVATE HEALTH INSURANCE | Attending: Radiation Oncology | Primary: Radiation Oncology

## 2021-04-04 ENCOUNTER — Other Ambulatory Visit: Admit: 2021-04-04 | Discharge: 2021-04-04 | Payer: PRIVATE HEALTH INSURANCE

## 2021-04-04 ENCOUNTER — Ambulatory Visit: Admit: 2021-04-04 | Discharge: 2021-04-04 | Payer: PRIVATE HEALTH INSURANCE

## 2021-04-04 ENCOUNTER — Ambulatory Visit
Admit: 2021-04-04 | Discharge: 2021-04-04 | Payer: PRIVATE HEALTH INSURANCE | Attending: Hematology & Oncology | Primary: Hematology & Oncology

## 2021-04-04 DIAGNOSIS — C439 Malignant melanoma of skin, unspecified: Principal | ICD-10-CM

## 2021-04-04 LAB — CBC W/ AUTO DIFF
BASOPHILS ABSOLUTE COUNT: 0.1 10*9/L (ref 0.0–0.1)
BASOPHILS RELATIVE PERCENT: 1.3 %
EOSINOPHILS ABSOLUTE COUNT: 0.3 10*9/L (ref 0.0–0.5)
EOSINOPHILS RELATIVE PERCENT: 3.4 %
HEMATOCRIT: 41.2 % (ref 39.0–48.0)
HEMOGLOBIN: 13.7 g/dL (ref 12.9–16.5)
LYMPHOCYTES ABSOLUTE COUNT: 1.1 10*9/L (ref 1.1–3.6)
LYMPHOCYTES RELATIVE PERCENT: 13.3 %
MEAN CORPUSCULAR HEMOGLOBIN CONC: 33.1 g/dL (ref 32.0–36.0)
MEAN CORPUSCULAR HEMOGLOBIN: 27 pg (ref 25.9–32.4)
MEAN CORPUSCULAR VOLUME: 81.6 fL (ref 77.6–95.7)
MEAN PLATELET VOLUME: 8.9 fL (ref 6.8–10.7)
MONOCYTES ABSOLUTE COUNT: 0.4 10*9/L (ref 0.3–0.8)
MONOCYTES RELATIVE PERCENT: 5.5 %
NEUTROPHILS ABSOLUTE COUNT: 6.2 10*9/L (ref 1.8–7.8)
NEUTROPHILS RELATIVE PERCENT: 76.5 %
PLATELET COUNT: 226 10*9/L (ref 150–450)
RED BLOOD CELL COUNT: 5.05 10*12/L (ref 4.26–5.60)
RED CELL DISTRIBUTION WIDTH: 17.6 % — ABNORMAL HIGH (ref 12.2–15.2)
WBC ADJUSTED: 8 10*9/L (ref 3.6–11.2)

## 2021-04-04 LAB — COMPREHENSIVE METABOLIC PANEL
ALBUMIN: 3.6 g/dL (ref 3.4–5.0)
ALKALINE PHOSPHATASE: 76 U/L (ref 46–116)
ALT (SGPT): 35 U/L (ref 10–49)
ANION GAP: 8 mmol/L (ref 5–14)
AST (SGOT): 29 U/L (ref <=34)
BILIRUBIN TOTAL: 0.2 mg/dL — ABNORMAL LOW (ref 0.3–1.2)
BLOOD UREA NITROGEN: 15 mg/dL (ref 9–23)
BUN / CREAT RATIO: 15
CALCIUM: 9.4 mg/dL (ref 8.7–10.4)
CHLORIDE: 107 mmol/L (ref 98–107)
CO2: 24 mmol/L (ref 20.0–31.0)
CREATININE: 0.98 mg/dL
EGFR CKD-EPI (2021) MALE: >90 mL/min/{1.73_m2} (ref >=60)
GLUCOSE RANDOM: 101 mg/dL (ref 70–179)
POTASSIUM: 4.5 mmol/L (ref 3.4–4.8)
PROTEIN TOTAL: 7.1 g/dL (ref 5.7–8.2)
SODIUM: 139 mmol/L (ref 135–145)

## 2021-04-04 LAB — T4, FREE: FREE T4: 1.01 ng/dL (ref 0.89–1.76)

## 2021-04-04 LAB — TSH: THYROID STIMULATING HORMONE: 1.157 u[IU]/mL (ref 0.550–4.780)

## 2021-04-04 MED ADMIN — gadobenate dimeglumine (MULTIHANCE) 529 mg/mL (0.1mmol/0.2mL) solution 20 mL: 20 mL | INTRAVENOUS | @ 14:00:00 | Stop: 2021-04-04

## 2021-04-04 MED ADMIN — iohexoL (OMNIPAQUE) 350 mg iodine/mL solution 75 mL: 75 mL | INTRAVENOUS | @ 14:00:00 | Stop: 2021-04-04

## 2021-04-04 NOTE — Unmapped (Signed)
04/04/2021    SiteLast TxDose  ZO:XWRUE Neck: 07/09/2013: 4,800/4,800 cGy  AV:WUJWJ Brai: 01/20/2020: 3,000/3,000 cGy    Subjective/Assessment/Recommendations: Kenneth Fitzgerald presents to Radiation Oncology clinic for follow up with metastatic melanoma.    1. 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  2. Pain: Denies pain  3. Elimination: No issues  4. Prescription Needs: None  5. Psychosocial: Has home support    Wt Readings from Last 6 Encounters:   04/04/21 (!) 146.6 kg (323 lb 3.2 oz)   04/04/21 (!) 147.4 kg (325 lb)   01/03/21 (!) 148 kg (326 lb 3.2 oz)   11/15/20 (!) 142.8 kg (314 lb 12.8 oz)   10/04/20 (!) 139.4 kg (307 lb 6.4 oz)   09/13/20 (!) 137.4 kg (303 lb)

## 2021-04-04 NOTE — Unmapped (Unsigned)
PRIMARY CARE PHYSICIAN  TIMOTHY R DRAPER, DO  [address]  [place] [address]    CONSULTING PHYSICIANS  [name], Peyton  [address]  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   -S/p thyroidectomy 06/2011 and radioactive iodine 03/2012 and again 05/2013.    -Thyroid CA recurrence 11/2012 s/p L neck dissecion 12/07/2012.  -2nd thyroid CA recurrence 03/2013 AND co-existing 1st melanoma recurrence 03/2013 s/p R -cervical level 2-4 node dissection and partial parotidectomy  -S/p radiation to R neck, completed 06/2013  -Nivolumab (02/12/18 x 2 cycles) complicated by colitis that required a 6 week steroid taper.   -s/p WBRT, completed 01/20/20    CURRENT THERAPY:   BRAF/MEK started early May 2020    ASSESSMENT: [name] is a pleasant 46 y.o. male who presents today for further evaluation of recurrent stage IV melanoma. 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    PLAN  1. Stage IV melanoma: BRAF+ by IHC. Dr Eyvonne Left reviewed path: TILS present, non-brisk; lymphocytes are 20 to 30% the number of melanoma cells.  Known sites of disease include abdominal/pelvic LN, and brain. Started dabrafenib/trametinib mid-May 2020, has had stable disease extracranially.      - Reviewed path from hernia surgery in Jan 2022, noted that the abdominal wall/peritoneal implants contained melanophages, but no melanoma cells.  We discussed that this is interesting, good to know that there wasn't melanoma in the biopsy, is in keeping w/immune response.    - He is back on BRAF/MEK targeted therapy.  Will continue.   - He is s/p WBRT, completed 01/20/20. MRI brain from today 04/04/21 personally reviewed and c/w stable disease.  Rad/onc is following.  - I personally reviewed CT chest from today which is NED, abdominal MRI w/out progression of disease. Implants are stable - may not be malignant as per above.  -clinically, he is doing very well, much improved over time.  -RTC in 12 weeks for clinical follow up and repeat imaging.  -Of note, we have attempted to confirm BRAF on NGS twice, on original primary specimen from July 2012 and and pelvic LN sample from 01/09/2018.  Both samples were insufficient tissue for analysis.  At this point, BRAF is known by IHC only.     #.) Brain mets; s/p WBRT as per above.  Brain MRI from today stable.  Asx at this time.  Rad/onc following.    #) L ear fullness, hearing loss; ENT following.  Is better s/p PE tube.     #.) LBP, radiculopathy, MRI abdomen 01/03/21 findings w/disc bulge   - imaging shows improvement in the L sided low back findings and pt reports symptom improvement.    #.) Atypical Crohn's; recent colonoscopy w/ulcerations and crypt abscesses.  Sulfasalazine per GI medicine.    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 During hospital stay, it was determined that this was malignant thrombus and no AC recommended.  Will follow.     #.) Hernia repair; all scars healed and he is eating and drinking normally.  Surgeon aware abdominal wall finding, CTM.     #.) Thyroid CA, recurrent. Well controlled now. Followed by endocrinology for thyroid and Ca+ replacement and ongoing monitoring of glucose.    #.)  Mood; excessive stress related to h/o 2 malignancies, job loss and legal issues surrounding, parents both passed away in recent years.   He follows w/ Drs. Yopp and [facility].     #.) HypoCA; he struggles w/compliance during the workday,cont Ca supplementation    #.) Supportive care/GOC;appreciate pall care support and med titration. Discussed w/[name]; OK to pause pall care for now given well controlled disease and well controlled symptoms.      INTERVAL HISTORY: This is a return visit to the [facility] for further evaluation of melanoma.  L sided back pain is improved, intermittent.  Occ has bilateral knee and hip pain, intermittent, improves w/time, rarely uses NSAIDS.  Had PE tube placed and this has helped, hearing is much better.     Made some dietary changes and Ca is better.  Energy is stable.    Works 45-50hrs/week.  No more dry heaving, no more nausea.  HA/scalp pain much better.  No new CNS symptoms.    Weight increased due to stress eating.  Appetite is improved.    Is getting PT for neck lymphedema, this is helping some.  Bowels are normal for him; recent diagnosis of atypical crohn's disease, continues on sulfasalazine 2 tabs BID  Follows w/Drs. [facility], finds this helpful.Mood is good.       PAST MEDICAL HISTORY:   1. Diverticulitis.   2. History of ankylosing spondylitis in 2009. Please see past medical history dictated on 11/15/2010 for details. In brief, he was treated w/Enbrel and prednisone for 6mos or so.  Symptoms improved, immunosuppressants were stopped and symptoms did not recur.   3. S/p incisional hernia repair       SOCIAL HISTORY:    reports that he has never smoked. He has never used smokeless tobacco. He reports current alcohol use. He reports that he does not use drugs.      MEDICATIONS:   Current Outpatient Medications   Medication Sig Dispense Refill   ??? acetaminophen (TYLENOL) 500 MG tablet Take 2 tablets (1,000 mg total) by mouth every eight (8) hours as needed for pain. 30 tablet 0   ??? binimetinib ([name]) 15 mg tablet Take 3 tablets (45 mg total) by mouth Two (2) times a day. 180 tablet 11   ??? blood sugar diagnostic Strp Dispense 100 blood glucose test strips, ok to sub any brand preferred by insurance/patient, use 3x/day; dispense whatever brand matches with meter. 100 strip 12   ??? blood-glucose meter kit Use as instructed; dispense 1 meter, whatever is preferred by insurance 1 each 1   ??? calcitrioL (ROCALTROL) 0.25 MCG capsule Take 1 capsule (0.25 mcg total) by mouth daily. 90 capsule 3   ??? calcium carbonate 650 mg calcium (1,625 mg) tablet Take 1 tablet (650 mg of elem calcium total) by mouth Three (3) times a day with a meal.     ??? DULoxetine (CYMBALTA) 30 MG capsule Take 1 capsule (30 mg total) by mouth Two (2) times a day. 180 capsule 2   ??? encorafenib (BRAFTOVI) 75 mg capsule Take 6 capsules (450 mg total) by mouth daily. 180 capsule 11   ??? fexofenadine (ALLEGRA ALLERGY) 180 MG tablet Take 1 tablet (180 mg total) by mouth daily as needed (allergies).     ??? fluticasone propionate (FLONASE) 50 mcg/actuation nasal spray 2 sprays into each nostril two (2) times a day. 16 g 6   ??? folic acid (FOLVITE) 1 MG tablet TAKE 1 TABLET  BY MOUTH EVERY DAY 90 tablet 3   ??? ibuprofen (ADVIL,MOTRIN) 200 MG tablet Take 3 tablets (600 mg total) by mouth daily as needed for pain.     ??? lancets Misc Dispense 100 lancets, ok to sub any brand preferred by insurance/patient, use 3x/day 100 each 12   ??? levothyroxine (SYNTHROID) 125 MCG tablet Take 2 tablets (250 mcg total) every Sunday (ie 1 day per week). Take 3 tablets (375 mcg total) on Monday-Saturday (ie 6 days per week). (Patient taking differently: Take 3 tablets (375 mcg total) by mouth daily.) 270 tablet 3   ??? memantine (NAMENDA) 10 MG tablet Take 1 tablet (10 mg total) by mouth daily.     ??? memantine (NAMENDA) 10 MG tablet TAKE 1 TABLET BY MOUTH TWO TIMES A DAY. 180 tablet 4   ??? mirtazapine (REMERON) 7.5 MG tablet Take 1 tablet (7.5 mg total) by mouth nightly. 90 tablet 3   ??? ofloxacin (OCUFLOX) 0.3 % ophthalmic solution Instill 4 drops in left ear twice daily for 5 days (Patient not taking: Reported on 04/04/2021) 5 mL 3   ??? ondansetron (ZOFRAN) 8 MG tablet Take 1 tablet (8 mg total) by mouth Take as directed.     ??? sulfaSALAzine (AZULFIDINE) 500 mg tablet Take 2 tablets (1,000 mg total) by mouth two (2) times a day. 120 tablet 5   ??? terbinafine HCL (LAMISIL) 250 mg tablet Take 1 tablet (250 mg total) by mouth in the morning.       No current facility-administered medications for this visit.       REVIEW OF SYSTEMS  A complete review of systems was obtained and was negative except for those symptoms listed in the HPI     PHYSICAL EXAM  Vitals:    04/04/21 1244   BP: 136/88   Pulse: 101   Temp: 36.8 ??C (98.2 ??F)   SpO2: 96%       GEN: Awake and alert, pleasant appearing male in no acute distress  LUNGS: normal WOB, CTAB  CV: NAD, S1S2  ABD; obese, soft NTND  SKIN: No rashes, petechiae or jaundice noted.  All melanoma scarlines examined; no nodularity.    PYSCH: Alert and oriented to person, place and time  EXT: No edema noted of the lower extremity noted.          LABS  Lab Results   Component Value Date    WBC 8.0 04/04/2021    HGB 13.7 04/04/2021    HCT 41.2 04/04/2021    PLT 226 04/04/2021       Lab Results   Component Value Date    NA 139 04/04/2021    K 4.5 04/04/2021    CL 107 04/04/2021    CO2 24.0 04/04/2021    BUN 15 04/04/2021    CREATININE 0.98 04/04/2021    GLU 101 04/04/2021    CALCIUM 9.4 04/04/2021    MG 1.8 02/01/2020    PHOS 5.8 (H) 05/10/2020       Lab Results   Component Value Date    BILITOT 0.2 (L) 04/04/2021    BILIDIR 0.20 01/27/2020    PROT 7.1 04/04/2021    ALBUMIN 3.6 04/04/2021    ALT 35 04/04/2021    AST 29 04/04/2021    ALKPHOS 76 04/04/2021    GGT 42 08/22/2011       Lab Results   Component Value Date    INR 1.12 01/24/2020       RADIOLOGY RESULTS;  CT chest 04/04/21:  IMPRESSION:  ??  1. No findings of intrathoracic metastasis  ??  2. No findings of infectious pneumonia  ??  3. A moderate burden of calcified atherosclerotic disease within the coronary arterial distribution  ??    Brain MRI 04/04/21:  IMPRESSION:   -- Similar size and distribution of innumerable cerebral and cerebellar micrometastases. No definite new lesions identified.      Abd MRI 04/04/21:  IMPRESSION:  Compared to 01/03/2021, similar appearance of T1 hyperintense lesions within hepatic segment V/VIII and capsular soft tissue implants, as described within the body of this report.  ??  No new or enlarging sites of metastatic disease within the abdomen.    Echo 04/29/19:  Summary    1. The left ventricle is normal in size with normal wall thickness.    2. The left ventricular systolic function is normal, LVEF is visually  estimated at 55%.    3. The right ventricle is upper normal in size, with normal systolic  function.          PATHOLOGY:  Pelvic LN 01/09/18:  Final Diagnosis   A: Colon, transverse, biopsy  - Moderately-severely active chronic colitis  - No CMV viral cytopathic effect, granuloma, or dysplasia identified  - No metastatic melanoma or papillary thyroid cancer identified  - See comment  ??  B: Lymph node, pelvic, core needle biopsy  - Metastatic melanoma  - See comment  ??     FNA R femur 07/19/14:  : Bone, right femur, core biopsy with touch imprints  - No primary or metastatic malignancy identified  - Bone with focal osteocyte dropout, suggestive of necrosis  - Bone marrow with trilineage hematopoiesis and focal effacement by chronic inflammation (see comment)    B: Bone, right femur, fine needle aspiration  - Few atypical cells present on direct smears (see comment)  - Scant fragments of edematous stromal tissue in cell block  - Bone marrow with trilineage hematopoiesis    Diagnosis:  Lymph nodes, right cervical, levels 2-4, removal and partial parotidectomy  -Metastatic melanoma involving 1 out of 20 lymph nodes, with the largest  diameter measuring 10 mm (1.0 cm) and no evidence of extracapsular extension  (1/20)  -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 comment)    B: Bone, right femur, fine needle aspiration  - Few atypical cells present on direct smears (see comment)  - Scant fragments of edematous stromal tissue in cell block  - Bone marrow with trilineage hematopoiesis    Diagnosis:  Lymph nodes, right cervical, levels 2-4, removal and partial parotidectomy  -Metastatic melanoma involving 1 out of 20 lymph nodes, with the largest  diameter measuring 10 mm (1.0 cm) and no evidence of extracapsular extension  (1/20)  -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

## 2021-04-04 NOTE — Unmapped (Signed)
Radiation Oncology Follow Up Visit Note    Patient Name: [facility]  Patient Age: 46 y.o.  Encounter Date: 04/04/2021    Referring Physician:   [name], MD  [address]  [id number] [facility] [address]  [place],  [place] [address]    Primary Care Provider:  Ralene Cork, DO    Diagnoses:  1. Malignant neoplasm of brain, unspecified location (CMS-HCC)        Treatment Site: whole brain to 30Gy completed 01/20/20  Right neck to 48 Gy completed 07/09/13    Interval Since Completion of Treatment: ~1 year 2 months since WBRT      Assessment: [name] is a 46 y.o. 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    He originally had a Stage III scalp melanoma in 2012, pT2pN2, s/p WLE and 3/3 positive SLN's, completion right level 2 neck dissection on 11/06/10 (0/25), one year of interferon completed 01/2012, and then right neck dissection 03/29/13 showing 1+/20 nodes (1cm with no ECE).     He also has multiply recurrent thyroid cancer in the same right neck dissection and is s/p RAI on 05/21/13.  Completed post-op EBRT to right neck to 48 Gy 07/09/13.    He has had stable extracranial disease on dabrafenib/trametinib and was noted to have multiple punctate lesions (best seen on T1 noncontrast sequence) in the brain suspicious for metastasis that have been increasing in number since January 2020.  He completed WBRT to 30 Gy on 01/20/20.    Today's MRI demonstrates intracranial control.  MRI abdomen showed unchanged hepatic implants without evidence of new disease. CT chest was NED.        Plan:     -Disease Status: intracranial control, NED chest on scans today; Stable in Ab/Pelv     -Care Plan: systemic therapy dabrafenib/trametinib per Dr Nedra Hai     -Symptoms management: follow up with Dr Ralene Ok in ENT 05/16/20    -Continuing memantine since tolerating well and feels that memory has improved.     -Follow-up: 3 months with MRI brain (coordinated with Dr. Nedra Hai)      Interval History:  [name] returns today for a regularly scheduled follow-up.  He continues to do very well posttreatment.  He is tolerating the memantine well and feels that his memory continues to improve.  He states that his memory has improved since prior to treatment.  He continues to stay very active with his busy work schedule. otherwise he denied any new symptoms today including nausea, dizziness, headaches, or new weaknesses.    Review of Systems: All other systems reviewed are negative. Pertinent positives and negatives are above in interval history. Past Medical, Surgical, Family and Social Histories reviewed and updated in the electronic medical record.      Oncology History   Malignant melanoma, metastatic (CMS-HCC)   03/17/2013 Initial Diagnosis    Melanoma (CMS-HCC)     02/12/2018 - 03/12/2018 Chemotherapy    OP NIVOLUMAB 480 MG Q4W  nivolumab 480 mg every 28 days     Malignant neoplasm of brain, unspecified location (CMS-HCC)   12/22/2019 Initial Diagnosis    Malignant neoplasm of brain, unspecified location (CMS-HCC)     12/22/2019 -  Radiation    Radiation Therapy Treatment Details (Noted on 12/22/2019)  Site: Brain  Technique: IMRT  Goal: No goal specified  Planned Treatment Start Date: No planned start date specified  Physical Exam:  Vitals:    04/04/21 1436   BP: 166/90   Pulse: 95   Temp: 35 ??C (95 ??F)   TempSrc: Temporal   Weight: (!) 146.6 kg (323 lb 3.2 oz)     ECOG: 90,  Able to carry on normal activity; minor signs or symptoms of disease (ECOG equivalent 0)  General/Constitutional: Well-appearing, NAD   HEENT: Normocephalic, atraumatic, no scleral icterus, missing teeth in back of mouth   Skin: No suspicious lesions or rashes  Pulmonary: No respiratory distress or increased work of breathing   Abdominal: Non distended  Musculoskeletal: Full range of motion in the extremities, without edema   Neurologic: Alert and oriented to conversation.  CN II-XII grossly intact. 5/5 strength throughout the upper and lower extremities.   Psychiatric: Appropriate affect and judgement        Radiology  CT Chest W Contrast    Result Date: 04/04/2021  EXAM: CT CHEST W CONTRAST DATE: 04/04/2021 9:44 AM ACCESSION: [id number] UN DICTATED: 04/04/2021 9:51 AM INTERPRETATION LOCATION: [facility]     CLINICAL INDICATION: 46 years old Male with melanoma  - C43.9 - Malignant melanoma, unspecified site (CMS - HCC)      COMPARISON: CT chest 01/03/2021     TECHNIQUE: Contiguous 0.6 or 1 mm and 2 mm axial images were reconstructed through the chest following a single breath hold helical acquisition during the administration of intravenous contrast material.  Images were reformatted in the axial and sagittal planes.  MIP slabs were also constructed.     FINDINGS:     LUNGS AND AIRWAYS: The lungs are clear.  The central airways are patent.     PLEURA: No pleural fluid or pneumothorax.     MEDIASTINUM AND LYMPH NODES: No enlarged intrathoracic or axillary lymph nodes.Marland Kitchen     HEART AND VASCULATURE: Coronary artery calcifications. The cardiac chambers are normal in size. There is no pericardial effusion. Ascending and descending aorta normal in caliber. Pulmonary artery normal in size.Marland Kitchen     BONES AND SOFT TISSUES: Midline back cutaneous nodule measures 1.8 cm (2:62), stable dating back to 2019. Postsurgical changes of the visualized lower neck. No suspicious osseous lesions.     UPPER ABDOMEN: Segment 5 hepatic lesions are similar to prior measuring 3.3 and 1.9 cm (6:136, 6:154), though better evaluated on prior MRI.. Stable peripherally calcified midline anterior abdominal nodule (2:145).     OTHER: No other significant findings.                 1. No findings of intrathoracic metastasis     2. No findings of infectious pneumonia     3. A moderate burden of calcified atherosclerotic disease within the coronary arterial distribution                     MRI Brain W Wo Contrast    Result Date: 04/04/2021  EXAM: Magnetic resonance imaging, brain, without and with contrast material. DATE: 04/04/2021 10:33 AM ACCESSION: [id number] UN DICTATED: 04/04/2021 10:48 AM INTERPRETATION LOCATION: [facility]     CLINICAL INDICATION: 46 years old Male with melanoma  - C43.9 - Malignant melanoma, unspecified site (CMS - HCC)      COMPARISON: Brain MRI 01/03/2021.     TECHNIQUE: Multiplanar, multisequence MR imaging of the brain was performed without and with I.V. contrast.     FINDINGS:  Similar size and number of innumerable punctate enhancing lesions throughout the bilateral cerebral and cerebellar hemispheres again consistent with  micrometastatic disease. There is mild periventricular and centrum semiovale white matter T2 FLAIR hyperintensity consistent with chronic microangiopathic change.      Ventricles are normal in size. There is no midline shift. No extra-axial fluid collection. Similar appearance of scattered foci of susceptibility blooming artifact associated with several metastases consistent with petechial hemosiderin staining. No acute intracranial blood products. No diffusion weighted signal abnormality to suggest acute infarct. There is left maxillary mucosal thickening and complete opacification. Right maxillary mucous retention cyst is present. The paranasal sinuses are otherwise clear. There is a large left mastoid air cell effusion.         IMPRESSION: -- Similar size and distribution of innumerable cerebral and cerebellar micrometastases. No definite new lesions identified.         MRI Abdomen W Wo Contrast    Result Date: 04/04/2021  EXAM: MRI abdomen with and without contrast DATE: 04/04/2021 10:33 AM ACCESSION: [id number] UN DICTATED: 04/04/2021 10:34 AM INTERPRETATION LOCATION: [facility]     CLINICAL INDICATION: 46 years old Male with melanoma  - C43.9 - Malignant melanoma, unspecified site (CMS - HCC)      COMPARISON: 01/03/2021 abdominal MRI and     TECHNIQUE: MRI of the abdomen was obtained with and without IV contrast. Multisequence, multiplanar images were obtained.        CONTRAST: 20 mL of  Multihance     FINDINGS:     LOWER CHEST: Please refer to separate dictated concurrent chest CT for findings above the diaphragm.     ABDOMEN:     HEPATOBILIARY: Normal liver contour.     Redemonstrated soft tissue implants along the capsule of hepatic segment V measuring 3.2 cm (5:42) and 2.4 cm (5:47), unchanged. 1.2 cm T1 hyperintense lesion in hepatic segment V/VIII (10:39), similar to prior and without evidence of diffusion signal abnormality or abnormal enhancement. There is a second smaller stable 1 cm lesion in segment 8 (10:29) with similar imaging characteristics. No new or enlarging focal hepatic lesion.     No biliary ductal dilatation. Gallbladder is surgically absent.     PANCREAS: Unremarkable. SPLEEN: Borderline splenomegaly measuring up to 13.4 cm in the craniocaudal dimension. ADRENAL GLANDS: Unremarkable. KIDNEYS/URETERS: Unremarkable. BOWEL/PERITONEUM/RETROPERITONEUM: No bowel obstruction. No acute inflammatory process No ascites. VASCULATURE: Abdominal aorta within normal limits. Unremarkable inferior vena cava. The portal venous system is patent. LYMPH NODES: No adenopathy.     BONES/SOFT TISSUES: Postsurgical changes of the anterior abdominal wall. There is a large simple fat-containing lipoma deep to the right latissimus dorsi muscle.     Previously visualized increased T2 signal and enhancement along the left medial psoas muscle adjacent to the L2-L3 intervertebral disc is decreased in conspicuity compared to prior (8:35, 18:62), and favored to be degenerative in nature.         Compared to 01/03/2021, similar appearance of T1 hyperintense lesions within hepatic segment V/VIII and capsular soft tissue implants, as described within the body of this report.     No new or enlarging sites of metastatic disease within the abdomen.           Electronically Signed:  Sunday Shams, MD  Radiation Oncology [facility]          ATTENDING ATTESTATION:    I saw and evaluated/examined the patient, participating in the key portions of the service.  I discussed the findings, assessment, and plan of care with the resident.  I have edited and agree with the findings and plan as  documented in the resident's note.      I personally reviewed all relevant data associated with this encounter including imaging, labwork, and prior records.      Mina Marble, MD, PhD  Assistant Professor  Department of Radiation Oncology  [facility]  [id number]  [place], [place] [address]  O: [phone]  P: [phone]

## 2021-04-04 NOTE — Unmapped (Signed)
11:44 AM---Labs drawn from pre-existing PIV & sent for analysis.  Line flushed & saline-locked.  PIV removed.   Care provided by [facility] Murrin RN.

## 2021-04-11 NOTE — Unmapped (Signed)
[facility] Refill Coordination Note    Specialty Medication(s) to be Shipped:   Hematology/Oncology: [name] and [facility] [place] [facility]    Other medication(s) to be shipped: [employer], DOB: 1975-11-05  Phone: [phone] (home)       All above HIPAA information was verified with patient.     Was a Nurse, learning disability used for this call? No    Completed refill call assessment today to schedule patient's medication shipment from the [facility] [phone]).  All relevant notes have been reviewed.     Specialty medication(s) and dose(s) confirmed: Regimen is correct and unchanged.   Changes to medications: Mirl reports no changes at this time.  Changes to insurance: No  New side effects reported not previously addressed with a pharmacist or physician: None reported  Questions for the pharmacist: No    Confirmed patient received a Conservation officer, historic buildings and a Surveyor, mining with first shipment. The patient will receive a drug information handout for each medication shipped and additional FDA Medication Guides as required.       DISEASE/MEDICATION-SPECIFIC INFORMATION        N/A    SPECIALTY MEDICATION ADHERENCE     Medication Adherence    Patient reported X missed doses in the last month: 0  Specialty Medication: Braftovi 75mg   Patient is on additional specialty medications: Yes  Additional Specialty Medications: [name] 15mg   Patient Reported Additional Medication X Missed Doses in the Last Month: 0  Patient is on more than two specialty medications: No  Informant: patient              Were doses missed due to medication being on hold? No    Braftovi 75 mg: 14 days of medicine on hand   [name] 15 mg: 14 days of medicine on hand       REFERRAL TO PHARMACIST     Referral to the pharmacist: Not needed      [facility]     Shipping address confirmed in Epic.     Delivery Scheduled: Yes, Expected medication delivery date: 04/26/21.     Medication will be delivered via UPS to the prescription address in Epic [place].    [name] M [name]   [facility] Specialty Technician

## 2021-04-13 NOTE — Unmapped (Signed)
[facility] [place]  OUTPATIENT PHYSICAL THERAPY  04/13/2021  Note Type: Treatment Note       Patient Name: Maria Gallicchio [facility]  Date of Birth:Aug 26, 1975  Diagnosis:   Encounter Diagnoses   Name Primary?    Lymphedema of face Yes    Scar condition and fibrosis of skin     Malignant melanoma, unspecified site (CMS-HCC)      Referring MD:  [name], MD     Date of Onset of Impairment-01/22/2021  Date PT Care Plan Established or Reviewed-02/22/2021  Date PT Treatment Started-02/22/2021   Plan of Care Effective Date:     Session #:  5 of 13      Contraindications:  none  Precautions:  hyper/hypothyroidism  thyroid cancer  Red Flags:  history of cancer melanoma and thyroid , brain mets     Metastatic cancer:   Location: brain    Assessment/Progress toward goals:  Pt is progressing toward meeting goals and Patient's skin integrity is improving.  Skin is softening with treatment which allows for improved lymph uptake, and decreased risk of infection Still waiting for completion of pump application.  Picking up garment next week.  Swelling increased today.          ASSESSMENT/Reason for Referral:   46 y.o. male presents with longstanding Stage II facial and neck lymphedema. The patient has an extensive CA history, now with brain mets for which he received whole brain radiation.   He had swelling with his initial diagnosis and sought lymphedema treatments at that time.  His swelling has worsened over the years, and he notes lymphorrhea in the right side of his face at times.   He currently does not perform self care.   In addition to lymphedema , the patient is experiencing these side effects from cancer treatments: Radiation: Fibrosis/myofascial restrictions      Recommendations for treatment/garments:  1) Patient will obtain proper garments to address swelling and improve tissue integrity. Garment recommendations :Jovi pak Half face and neck mask for right side of face with universal neck pad  2) Pt will be seen clinically for rehabilitation to include treatments to address as needed: edema/lymphedema, pain, ROM, strength, balance and endurance deficits, as well as learning self care strategies to address these deficits  3) Pt will require a pneumatic compression pump because despite conservative treatment ZO:[id number] garments or devices, elevation, skin care and self MLD,the patient exhibits the following symptoms:hyperpigmentation, skin breakdown with lymphorrhea, fibrosis, progressive edema, chest/axillary swelling and unable to control swelling    Patient requires skilled Physical Therapy services  for the following problem list and secondary functional limitations:    Problem List:   Decreased knowledge of self care of lymphedema  Lack of appropriate compression garment  Increased risk of infections  Increased swelling     Secondary Functional Limitations:  Decreased knowledge of self care can lead to decreased skin integrity and put patient at risk for infections  Lack of a garment can cause increased swelling and increased risk of infection    Patient Goals: Decrease swelling and obtain a compression garment    Physical Therapy Goals:   In 1-3 months: Pt and/or caregiver will:    Decrease swelling in the face and neck to reduce infection risk, improve overall appearance, and improve QOL      In 6-12 months: Pt and/or caregiver will:  Be independent with home management of self care related to lymphedema to reduce infection risk   Retain optimal  reduction in swelling that allows pt to fit into appropriate compression garments, clothing, maintain a more normal appearance, and improve QOL              PLAN:     Planned frequency and duration  of treatment:   1 x week x 12 weeks    Next Visit Plan: check on garment and pump status,  MLD, MFR, lymphatouch, taping prn        SUBJECTIVE:  Patient???s communication preference: verbal, written, visual, prn   History of Present Illness/ Pt reports:  Went on vacation last week, not much self care.  Noting some increased pain left scapular area    Location of pain: no pain        OBJECTIVE:      Posture/Observations:  Forward head, rounded shoulders , mild    Palpation: fibrosis right side of neck       Range of Motion/Flexibilty:   Cervical WFL  UE WFL     Jaw ROM: WFL     Strength/MMT:   Cervical WFL   UE WFL         Girth Measurements: taken in cm          Neck Composite measures:  DATE: 2-9 3-31       Superior (just below mandible)cB at 6 cm below ear  46.2 51         Middle (midway between top and bottom)cD at 9 cm below ear 43.5 48         Inferior (lowest circumferential location)cC at 12cm below ear 43.4 42         Tragus to tragus cA at cm in front of earlobe             Facial Measures:  DATE 2-9 2-9            Right Left  Right Left Right Left Right Left Right Left   Tragus to inner corner of eye F1   13 13.2             Tragus to outer corner of mouth F2   14 13.4             Tragus to mid chin F3   18 17.2             Mandibular angle outer corner of mouth F4                      Location of swelling:  right face ad bilateral neck    Skin condition:  increased skin thickness and radiation fibrosis  Incision/Scar:   reduced scar mobility   myofascial restrictions                 Total Treatment Time: 53    Treatment Rendered:    Patient and/or caregiver and therapist were mask compliant per current [facility] COVID mask policy during the entire treatment session. Pt did not wear mask so that MLD could be performed on his face.     Manual x 53 min   Myofascial release/Spontaneous Muscle Release Techniques, STM to affected areas , Lymphatouch to affected areas with simultaneous MFR and MLD to appropriate anastomoses in order to decrease swelling.  Application of compression garment/devices prn to prevent re-accumulation of fluid.            Equipment provided/recommended:   N/A    Communication/consultation with other professionals:  N/A    Referrals made  to the following providers:  none    Medical Necessity: This treatment is medically necessary throughout the course of this patient's life during and after cancer treatments to improve functional activities and /or to minimize the decline of functional abilities and worsening of symptoms,including infection and recurrent hospitalizations, through independent/home management strategies.  Lymphedema, a chronic, progressive condition for which there is no cure, is marked by the accumulation of protein-rich fluid in one or more quadrants of the body due to primary or secondary disruption of the lymphatic system.  The sustained accumulation results in tissue inflammation, an increase in fatty tissue, and development of obstructive connective tissue.  These changes may result in an increased risk of infection, disfigurement and a decrease in mobility and functional performance.   Pt will benefit from physical therapy by a certified lymphedema therapist to address : education in self care, precautions, and management of symptoms, optimal edema reduction/maintenance of girth, optimal soft tissue changes, fitting of appropriate compression garments, and progression of exercises to optimize functional ROM and strength, balance, and endurance in all ADL's(home, work, recreational, community).        I attest that I have reviewed the above information.  SignedClementeen Graham, PT   04/13/2021 4:15 PM              04/13/2021 4:15 PM

## 2021-04-20 ENCOUNTER — Ambulatory Visit
Admit: 2021-04-20 | Discharge: 2021-04-21 | Payer: PRIVATE HEALTH INSURANCE | Attending: Rehabilitative and Restorative Service Providers" | Primary: Rehabilitative and Restorative Service Providers"

## 2021-04-20 NOTE — Unmapped (Signed)
[facility] THERAPY SERVICES [place]  OUTPATIENT PHYSICAL THERAPY  04/20/2021  Note Type: Treatment Note       Patient Name: Zavien Clubb [facility]  Date of Birth:Jun 08, 1975  Diagnosis:   Encounter Diagnoses   Name Primary?    Lymphedema of face Yes    Scar condition and fibrosis of skin     Malignant melanoma, unspecified site (CMS-HCC)      Referring MD:  [name], MD     Date of Onset of Impairment-01/22/2021  Date PT Care Plan Established or Reviewed-02/22/2021  Date PT Treatment Started-02/22/2021   Plan of Care Effective Date:     Session #:  6 of 13      Contraindications:  none  Precautions:  hyper/hypothyroidism  thyroid cancer  Red Flags:  history of cancer melanoma and thyroid , brain mets     Metastatic cancer:   Location: brain    Assessment/Progress toward goals:  Pt is progressing toward meeting goals and Patient's skin integrity is improving.  Skin is softening with treatment which allows for improved lymph uptake, and decreased risk of infection Still waiting for completion of pump application.  Getting garment today.          ASSESSMENT/Reason for Referral:   46 y.o. male presents with longstanding Stage II facial and neck lymphedema. The patient has an extensive CA history, now with brain mets for which he received whole brain radiation.   He had swelling with his initial diagnosis and sought lymphedema treatments at that time.  His swelling has worsened over the years, and he notes lymphorrhea in the right side of his face at times.   He currently does not perform self care.   In addition to lymphedema , the patient is experiencing these side effects from cancer treatments: Radiation: Fibrosis/myofascial restrictions      Recommendations for treatment/garments:  1) Patient will obtain proper garments to address swelling and improve tissue integrity. Garment recommendations :Jovi pak Half face and neck mask for right side of face with universal neck pad  2) Pt will be seen clinically for rehabilitation to include treatments to address as needed: edema/lymphedema, pain, ROM, strength, balance and endurance deficits, as well as learning self care strategies to address these deficits  3) Pt will require a pneumatic compression pump because despite conservative treatment [id number]:[id number] garments or devices, elevation, skin care and self MLD,the patient exhibits the following symptoms:hyperpigmentation, skin breakdown with lymphorrhea, fibrosis, progressive edema, chest/axillary swelling and unable to control swelling    Patient requires skilled Physical Therapy services  for the following problem list and secondary functional limitations:    Problem List:   Decreased knowledge of self care of lymphedema  Lack of appropriate compression garment  Increased risk of infections  Increased swelling     Secondary Functional Limitations:  Decreased knowledge of self care can lead to decreased skin integrity and put patient at risk for infections  Lack of a garment can cause increased swelling and increased risk of infection    Patient Goals: Decrease swelling and obtain a compression garment    Physical Therapy Goals:   In 1-3 months: Pt and/or caregiver will:    Decrease swelling in the face and neck to reduce infection risk, improve overall appearance, and improve QOL      In 6-12 months: Pt and/or caregiver will:  Be independent with home management of self care related to lymphedema to reduce infection risk   Retain optimal reduction in swelling that allows pt  to fit into appropriate compression garments, clothing, maintain a more normal appearance, and improve QOL              PLAN:     Planned frequency and duration  of treatment:   1 x week x 12 weeks    Next Visit Plan: check on garment and pump status,  MLD, MFR, lymphatouch, taping prn        SUBJECTIVE:  Patient???s communication preference: verbal, written, visual, prn   History of Present Illness/ Pt reports:  Getting garment today after my visit    Location of pain: no pain        OBJECTIVE:      Posture/Observations:  Forward head, rounded shoulders , mild    Palpation: fibrosis right side of neck       Range of Motion/Flexibilty:   Cervical WFL  UE WFL     Jaw ROM: WFL     Strength/MMT:   Cervical WFL   UE WFL         Girth Measurements: taken in cm          Neck Composite measures:  DATE: 2-9 3-31       Superior (just below mandible)cB at 6 cm below ear  46.2 51         Middle (midway between top and bottom)cD at 9 cm below ear 43.5 48         Inferior (lowest circumferential location)cC at 12cm below ear 43.4 42         Tragus to tragus cA at cm in front of earlobe             Facial Measures:  DATE 2-9 2-9            Right Left  Right Left Right Left Right Left Right Left   Tragus to inner corner of eye F1   13 13.2             Tragus to outer corner of mouth F2   14 13.4             Tragus to mid chin F3   18 17.2             Mandibular angle outer corner of mouth F4                      Location of swelling:  right face ad bilateral neck    Skin condition:  increased skin thickness and radiation fibrosis  Incision/Scar:   reduced scar mobility   myofascial restrictions                 Total Treatment Time: 53    Treatment Rendered:    Patient and/or caregiver and therapist were mask compliant per current [place] COVID mask policy during the entire treatment session. Pt did not wear mask so that MLD could be performed on his face.     Manual x 53 min   Myofascial release/Spontaneous Muscle Release Techniques, STM to affected areas , Lymphatouch to affected areas with simultaneous MFR and MLD to appropriate anastomoses in order to decrease swelling.  Application of compression garment/devices prn to prevent re-accumulation of fluid.      Advised to wear garment 4-6 hours per day as tolerated, ok to break up into several sessions       Equipment provided/recommended:   N/A    Communication/consultation with other professionals:  N/A    Referrals made  to the following providers:  none    Medical Necessity: This treatment is medically necessary throughout the course of this patient's life during and after cancer treatments to improve functional activities and /or to minimize the decline of functional abilities and worsening of symptoms,including infection and recurrent hospitalizations, through independent/home management strategies.  Lymphedema, a chronic, progressive condition for which there is no cure, is marked by the accumulation of protein-rich fluid in one or more quadrants of the body due to primary or secondary disruption of the lymphatic system.  The sustained accumulation results in tissue inflammation, an increase in fatty tissue, and development of obstructive connective tissue.  These changes may result in an increased risk of infection, disfigurement and a decrease in mobility and functional performance.   Pt will benefit from physical therapy by a certified lymphedema therapist to address : education in self care, precautions, and management of symptoms, optimal edema reduction/maintenance of girth, optimal soft tissue changes, fitting of appropriate compression garments, and progression of exercises to optimize functional ROM and strength, balance, and endurance in all ADL's(home, work, recreational, community).        I attest that I have reviewed the above information.  Signed: Clementeen Graham, PT   04/20/2021 1:02 PM

## 2021-04-20 NOTE — Unmapped (Signed)
Compression Garment Delivery    Actions taken today     Devices provided:   Device Data    Base Device  Quantity: 1  Side: Right  Description: JoviPak Half Face Mask  Part Number: [id number]  Serial Number: Lot [id number]  Warranty: 90 Days  Action: Delivered      Supplies  Supplies 1-5: Supply 1  Supply 1  Quantity: 1  Side: Not Applicable  Description: JoviPad Neck Pad  Part Number: [id number]  Serial Number: Lot [id number]  Warranty: 90 Days  Action: Delivered      Adjustments made    Size none   Alignment none   Other none         Goals     Manage lymphedema  Wear garments daily      Assessment   Assessment: Mr. Polgar was fit and delivered his compression garments. Once donned he denied any discomfort or excessive tightness. Fit and function was appropriate.       Device checked for safety and security during the visit today.      Instructions provided:   [x]  Device purpose   [x]  How to put on, take off and wear the device  [x]  Cleaning / Maintenance  [x]  Risks / Benefits of wearing   [x]  Instructions to contact should issues arise      Plan    Return Visit:  PRN

## 2021-04-25 MED FILL — MEKTOVI 15 MG TABLET: ORAL | 30 days supply | Qty: 180 | Fill #6

## 2021-04-25 MED FILL — BRAFTOVI 75 MG CAPSULE: ORAL | 30 days supply | Qty: 180 | Fill #6

## 2021-04-25 MED FILL — SULFASALAZINE 500 MG TABLET: ORAL | 30 days supply | Qty: 120 | Fill #1

## 2021-04-27 ENCOUNTER — Ambulatory Visit
Admit: 2021-04-27 | Payer: PRIVATE HEALTH INSURANCE | Attending: Rehabilitative and Restorative Service Providers" | Primary: Rehabilitative and Restorative Service Providers"

## 2021-04-27 NOTE — Unmapped (Signed)
[facility] THERAPY SERVICES [place]  OUTPATIENT PHYSICAL THERAPY  04/27/2021  Note Type: Treatment Note       Patient Name: Kenneth Fitzgerald [facility]  Date of Birth:08-13-1975  Diagnosis:   Encounter Diagnoses   Name Primary?    Lymphedema of face Yes    Scar condition and fibrosis of skin     Malignant melanoma, unspecified site (CMS-HCC)      Referring MD:  [name], MD     Date of Onset of Impairment-01/22/2021  Date PT Care Plan Established or Reviewed-02/22/2021  Date PT Treatment Started-02/22/2021   Plan of Care Effective Date:     Session #:  7 of 13      Contraindications:  none  Precautions:  hyper/hypothyroidism  thyroid cancer  Red Flags:  history of cancer melanoma and thyroid , brain mets     Metastatic cancer:   Location: brain    Assessment/Progress toward goals:  Pt is progressing toward meeting goals and Patient's skin integrity is improving.  Skin is softening with treatment which allows for improved lymph uptake, and decreased risk of infection Still waiting for completion of pump application.  Garment for right side fibrosis, but pt has more swelling on left, can flip it and use on left side of face.  Fit is fine, however, pt cannot tolerate the garment, he feels it is too claustrophobic and he has not been able to use.  Will see if we can swap out for just a chin strap and regular jovi pak pad. Will email DME for patient.  Patient to follow up with phone call.  Pt understood.  Pt is in contact with pump company.          ASSESSMENT/Reason for Referral:   46 y.o. male presents with longstanding Stage II facial and neck lymphedema. The patient has an extensive CA history, now with brain mets for which he received whole brain radiation.   He had swelling with his initial diagnosis and sought lymphedema treatments at that time.  His swelling has worsened over the years, and he notes lymphorrhea in the right side of his face at times.   He currently does not perform self care.   In addition to lymphedema , the patient is experiencing these side effects from cancer treatments: Radiation: Fibrosis/myofascial restrictions      Recommendations for treatment/garments:  1) Patient will obtain proper garments to address swelling and improve tissue integrity. Garment recommendations :Jovi pak Half face and neck mask for right side of face with universal neck pad  2) Pt will be seen clinically for rehabilitation to include treatments to address as needed: edema/lymphedema, pain, ROM, strength, balance and endurance deficits, as well as learning self care strategies to address these deficits  3) Pt will require a pneumatic compression pump because despite conservative treatment [id number] garments or devices, elevation, skin care and self MLD,the patient exhibits the following symptoms:hyperpigmentation, skin breakdown with lymphorrhea, fibrosis, progressive edema, chest/axillary swelling and unable to control swelling    Patient requires skilled Physical Therapy services  for the following problem list and secondary functional limitations:    Problem List:   Decreased knowledge of self care of lymphedema  Lack of appropriate compression garment  Increased risk of infections  Increased swelling     Secondary Functional Limitations:  Decreased knowledge of self care can lead to decreased skin integrity and put patient at risk for infections  Lack of a garment can cause increased swelling and increased risk of infection  Patient Goals: Decrease swelling and obtain a compression garment    Physical Therapy Goals:   In 1-3 months: Pt and/or caregiver will:    Decrease swelling in the face and neck to reduce infection risk, improve overall appearance, and improve QOL      In 6-12 months: Pt and/or caregiver will:  Be independent with home management of self care related to lymphedema to reduce infection risk   Retain optimal reduction in swelling that allows pt to fit into appropriate compression garments, clothing, maintain a more normal appearance, and improve QOL              PLAN:     Planned frequency and duration  of treatment:   1 x week x 12 weeks    Next Visit Plan: check on garment and pump status,  MLD, MFR, lymphatouch, taping prn        SUBJECTIVE:  Patient???s communication preference: verbal, written, visual, prn   History of Present Illness/ Pt reports:  The garment is for my right side, but my left side is more swollen  Pt does have fibrosis on right ( involved) side  ( Garment can be flipped to accommodate either side )    Location of pain: no pain        OBJECTIVE:      Posture/Observations:  Forward head, rounded shoulders , mild    Palpation: fibrosis right side of neck       Range of Motion/Flexibilty:   Cervical WFL  UE WFL     Jaw ROM: WFL     Strength/MMT:   Cervical WFL   UE WFL         Girth Measurements: taken in cm          Neck Composite measures:  DATE: 2-9 3-31       Superior (just below mandible)cB at 6 cm below ear  46.2 51         Middle (midway between top and bottom)cD at 9 cm below ear 43.5 48         Inferior (lowest circumferential location)cC at 12cm below ear 43.4 42         Tragus to tragus cA at cm in front of earlobe             Facial Measures:  DATE 2-9 2-9            Right Left  Right Left Right Left Right Left Right Left   Tragus to inner corner of eye F1   13 13.2             Tragus to outer corner of mouth F2   14 13.4             Tragus to mid chin F3   18 17.2             Mandibular angle outer corner of mouth F4                      Location of swelling:  right face ad bilateral neck    Skin condition:  increased skin thickness and radiation fibrosis  Incision/Scar:   reduced scar mobility   myofascial restrictions                 Total Treatment Time: 53    Treatment Rendered:    Patient and/or caregiver and therapist were mask compliant per current [place] COVID mask policy during the entire  treatment session. Pt did not wear mask so that MLD could be performed on his face.     Manual x 53 min   Myofascial release/Spontaneous Muscle Release Techniques, STM to affected areas , and MLD to appropriate anastomoses in order to decrease swelling.  Application of compression garment/devices prn to prevent re-accumulation of fluid.      Advised to wear garment 4-6 hours per day as tolerated, ok to break up into several sessions       Equipment provided/recommended:   N/A    Communication/consultation with other professionals:  Email to DME re: garment needs try to see if P and O can switch out.      Referrals made to the following providers:  none    Medical Necessity: This treatment is medically necessary throughout the course of this patient's life during and after cancer treatments to improve functional activities and /or to minimize the decline of functional abilities and worsening of symptoms,including infection and recurrent hospitalizations, through independent/home management strategies.  Lymphedema, a chronic, progressive condition for which there is no cure, is marked by the accumulation of protein-rich fluid in one or more quadrants of the body due to primary or secondary disruption of the lymphatic system.  The sustained accumulation results in tissue inflammation, an increase in fatty tissue, and development of obstructive connective tissue.  These changes may result in an increased risk of infection, disfigurement and a decrease in mobility and functional performance.   Pt will benefit from physical therapy by a certified lymphedema therapist to address : education in self care, precautions, and management of symptoms, optimal edema reduction/maintenance of girth, optimal soft tissue changes, fitting of appropriate compression garments, and progression of exercises to optimize functional ROM and strength, balance, and endurance in all ADL's(home, work, recreational, community).        I attest that I have reviewed the above information.  Signed: Clementeen Graham, PT   04/27/2021 11:30 AM

## 2021-05-03 NOTE — Unmapped (Signed)
[facility] THERAPY SERVICES [place]  OUTPATIENT PHYSICAL THERAPY  05/03/2021  Note Type: Treatment Note       Patient Name: Kenneth Fitzgerald [facility]  Date of Birth:Sep 24, 1975  Diagnosis:   Encounter Diagnoses   Name Primary?    Lymphedema of face Yes    Scar condition and fibrosis of skin     Malignant melanoma, unspecified site (CMS-HCC)      Referring MD:  [name], MD     Date of Onset of Impairment-01/22/2021  Date PT Care Plan Established or Reviewed-02/22/2021  Date PT Treatment Started-02/22/2021   Plan of Care Effective Date:     Session #:  8 of 13      Contraindications:  none  Precautions:  hyper/hypothyroidism  thyroid cancer  Red Flags:  history of cancer melanoma and thyroid , brain mets     Metastatic cancer:   Location: brain    Assessment/Progress toward goals:  Pt is progressing toward meeting goals and Patient's skin integrity is improving.  Skin is softening with treatment which allows for improved lymph uptake, and decreased risk of infection Still waiting for completion of pump application. Pt has not contacted P&O for garment.  Swelling continues, but skin is softening.           ASSESSMENT/Reason for Referral:   46 y.o. male presents with longstanding Stage II facial and neck lymphedema. The patient has an extensive CA history, now with brain mets for which he received whole brain radiation.   He had swelling with his initial diagnosis and sought lymphedema treatments at that time.  His swelling has worsened over the years, and he notes lymphorrhea in the right side of his face at times.   He currently does not perform self care.   In addition to lymphedema , the patient is experiencing these side effects from cancer treatments: Radiation: Fibrosis/myofascial restrictions      Recommendations for treatment/garments:  1) Patient will obtain proper garments to address swelling and improve tissue integrity. Garment recommendations :Jovi pak Half face and neck mask for right side of face with universal neck pad  2) Pt will be seen clinically for rehabilitation to include treatments to address as needed: edema/lymphedema, pain, ROM, strength, balance and endurance deficits, as well as learning self care strategies to address these deficits  3) Pt will require a pneumatic compression pump because despite conservative treatment [id number] garments or devices, elevation, skin care and self MLD,the patient exhibits the following symptoms:hyperpigmentation, skin breakdown with lymphorrhea, fibrosis, progressive edema, chest/axillary swelling and unable to control swelling    Patient requires skilled Physical Therapy services  for the following problem list and secondary functional limitations:    Problem List:   Decreased knowledge of self care of lymphedema  Lack of appropriate compression garment  Increased risk of infections  Increased swelling     Secondary Functional Limitations:  Decreased knowledge of self care can lead to decreased skin integrity and put patient at risk for infections  Lack of a garment can cause increased swelling and increased risk of infection    Patient Goals: Decrease swelling and obtain a compression garment    Physical Therapy Goals:   In 1-3 months: Pt and/or caregiver will:    Decrease swelling in the face and neck to reduce infection risk, improve overall appearance, and improve QOL      In 6-12 months: Pt and/or caregiver will:  Be independent with home management of self care related to lymphedema to reduce infection  risk   Retain optimal reduction in swelling that allows pt to fit into appropriate compression garments, clothing, maintain a more normal appearance, and improve QOL              PLAN:     Planned frequency and duration  of treatment:   1 x week x 12 weeks    Next Visit Plan: check on garment and pump status,  MLD, MFR, lymphatouch, taping prn        SUBJECTIVE:  Patient???s communication preference: verbal, written, visual, prn   History of Present Illness/ Pt reports:  Has not contacted P&O    Location of pain: no pain        OBJECTIVE:      Posture/Observations:  Forward head, rounded shoulders , mild    Palpation: fibrosis right side of neck       Range of Motion/Flexibilty:   Cervical WFL  UE WFL     Jaw ROM: WFL     Strength/MMT:   Cervical WFL   UE WFL         Girth Measurements: taken in cm          Neck Composite measures:  DATE: 2-9 3-31       Superior (just below mandible)cB at 6 cm below ear  46.2 51         Middle (midway between top and bottom)cD at 9 cm below ear 43.5 48         Inferior (lowest circumferential location)cC at 12cm below ear 43.4 42         Tragus to tragus cA at cm in front of earlobe             Facial Measures:  DATE 2-9 2-9            Right Left  Right Left Right Left Right Left Right Left   Tragus to inner corner of eye F1   13 13.2             Tragus to outer corner of mouth F2   14 13.4             Tragus to mid chin F3   18 17.2             Mandibular angle outer corner of mouth F4                      Location of swelling:  right face ad bilateral neck    Skin condition:  increased skin thickness and radiation fibrosis  Incision/Scar:   reduced scar mobility   myofascial restrictions                 Total Treatment Time: 53    Treatment Rendered:    Patient and/or caregiver and therapist were mask compliant per current [place] COVID mask policy during the entire treatment session. Pt did not wear mask so that MLD could be performed on his face.     Manual x 53 min   Myofascial release/Spontaneous Muscle Release Techniques, STM to affected areas , and MLD to appropriate anastomoses in order to decrease swelling.  Application of compression garment/devices prn to prevent re-accumulation of fluid.      Advised to wear garment 4-6 hours per day as tolerated, ok to break up into several sessions       Equipment provided/recommended:   N/A    Communication/consultation with other professionals:  N/A  Referrals made to the following providers:  none    Medical Necessity: This treatment is medically necessary throughout the course of this patient's life during and after cancer treatments to improve functional activities and /or to minimize the decline of functional abilities and worsening of symptoms,including infection and recurrent hospitalizations, through independent/home management strategies.  Lymphedema, a chronic, progressive condition for which there is no cure, is marked by the accumulation of protein-rich fluid in one or more quadrants of the body due to primary or secondary disruption of the lymphatic system.  The sustained accumulation results in tissue inflammation, an increase in fatty tissue, and development of obstructive connective tissue.  These changes may result in an increased risk of infection, disfigurement and a decrease in mobility and functional performance.   Pt will benefit from physical therapy by a certified lymphedema therapist to address : education in self care, precautions, and management of symptoms, optimal edema reduction/maintenance of girth, optimal soft tissue changes, fitting of appropriate compression garments, and progression of exercises to optimize functional ROM and strength, balance, and endurance in all ADL's(home, work, recreational, community).        I attest that I have reviewed the above information.  Signed: Clementeen Graham, PT   05/03/2021 10:19 AM

## 2021-05-16 ENCOUNTER — Ambulatory Visit
Admit: 2021-05-16 | Discharge: 2021-05-17 | Payer: PRIVATE HEALTH INSURANCE | Attending: Otolaryngology | Primary: Otolaryngology

## 2021-05-16 NOTE — Unmapped (Signed)
Kenneth Fitzgerald [facility] returns today in follow-up of recurrent left sided effusions in the setting of previous radiation. He previously underwent nasal endoscopy with Dr. Ralene Ok that was normal. Given that fluid returned after two myringotomies, he opted to proceed with left sided PE tube at his last office visit on 11/15/20. He reports decrease in left sided hearing with associated drainage two weeks ago. He thinks the tube extruded. He currently has some sinus pressure and nasal congestion. He has been using Flonase, but not nasal saline rinses.      Past Medical History  He  has a past medical history of Cancer (CMS-HCC), Disease of thyroid gland, HL (hearing loss), Hypothyroidism, and Skin cancer.    Review of systems   Review of systems was reviewed on attached notes/patient intake forms.     Physical Examination  There were no vitals taken for this visit.    General: well appearing, stated age, no distress   Head - atraumatic, normocephalic   Nose: dorsum midline, no rhinorrhea  Neck: symmetric, trachea midline  Psychiatric: alert and oriented, appropriate mood and affect   Respiratory: no audible wheezing or stridor, normal work of breathing  Neurologic - cranial nerves 2-12 grossly intact  Facial Strength - HB 1/6 bilaterally    Ears - External ear- normal, no lesions, no malformations   Otoscopy - R: EAC with impacted cerumen (removed), TM intact, ME clear. L: EAC with extruded PE tube surrounded by cerumen (removed), TM intact, ME clear.  Weber midline, BC > AC BL    Procedure: cerumen removal AU  Preop Dx: cerumen  Anesthesia: none  Description: cerumen removed from affected ear(s) using microinstruments and binocular microscopy    Audiogram  The previous audiogram was reviewed.       Imaging  None.     Assessment and Plan  Kenneth Fitzgerald is a 46 y.o. male with persistent left sided serous otitis media in the setting of previous radiation as well as bilateral conductive hearing loss, left worse than right. Left sided PE tube was extruded into the ear canal and removed with improvement in hearing and pressure. No effusion visualized in middle ear space. Discussed options including replacing PE tube vs watchful waiting. Will proceed with observation for now given that no fluid was visualized. Discussed optimizing sinus regimen to help with congestion. Plan for follow up in 3 months or sooner with issues.     The patient/family voiced understanding of the plan as detailed above and is in agreement. I appreciate the opportunity to participate in his care.    I attest to the above information and documentation. However, this note has been created using voice recognition software and may have errors that were not dictated and not seen in editing.    Cheryl Flash MD  Assistant Professor   Division of Otology/Neurotology  Department of [facility], [place], [facility]    Scribe's Attestation: A. Eleonore Chiquito, MD obtained and performed the history, physical exam and medical decision making elements that were entered into the chart. Signed by Rozann Lesches, Scribe, on May 16, 2021 at 10:46 AM.    ----------------------------------------------------------------------------------------------------------------------  May 17, 2021 2:27 PM. Documentation assistance provided by the Scribe. I was present during the time the encounter was recorded. The information recorded by the Scribe was done at my direction and has been reviewed and validated by me.  ----------------------------------------------------------------------------------------------------------------------

## 2021-05-17 NOTE — Unmapped (Signed)
[facility] THERAPY SERVICES [place]  OUTPATIENT PHYSICAL THERAPY  05/16/2021  Note Type: Treatment Note       Patient Name: Kenneth Fitzgerald [facility]  Date of Birth:Sep 26, 1975  Diagnosis:   Encounter Diagnoses   Name Primary?    Lymphedema of face Yes    Scar condition and fibrosis of skin     Malignant melanoma, unspecified site (CMS-HCC)      Referring MD:  [name], MD     Date of Onset of Impairment-01/22/2021  Date PT Care Plan Established or Reviewed-02/22/2021  Date PT Treatment Started-02/22/2021   Plan of Care Effective Date:     Session #:  9 of 13      Contraindications:  none  Precautions:  hyper/hypothyroidism  thyroid cancer  Red Flags:  history of cancer melanoma and thyroid , brain mets     Metastatic cancer:   Location: brain    Assessment/Progress toward goals:  Pt is progressing toward meeting goals and Patient's skin integrity is improving.  Skin is softening with treatment which allows for improved lymph uptake, and decreased risk of infection Patient to get new submental garment for face next week.  Increased right sided facial swelling today, decreased post treatment.  Swelling continues as pt has not been able to use facial garment ordered due to  feeling claustrophobic in it.  Hopefully, he will be able to tolerate a smaller garment and some foam on facial area.          ASSESSMENT/Reason for Referral:   46 y.o. male presents with longstanding Stage II facial and neck lymphedema. The patient has an extensive CA history, now with brain mets for which he received whole brain radiation.   He had swelling with his initial diagnosis and sought lymphedema treatments at that time.  His swelling has worsened over the years, and he notes lymphorrhea in the right side of his face at times.   He currently does not perform self care.   In addition to lymphedema , the patient is experiencing these side effects from cancer treatments: Radiation: Fibrosis/myofascial restrictions      Recommendations for treatment/garments:  1) Patient will obtain proper garments to address swelling and improve tissue integrity. Garment recommendations :Jovi pak Half face and neck mask for right side of face with universal neck pad  2) Pt will be seen clinically for rehabilitation to include treatments to address as needed: edema/lymphedema, pain, ROM, strength, balance and endurance deficits, as well as learning self care strategies to address these deficits  3) Pt will require a pneumatic compression pump because despite conservative treatment ZO:[id number] garments or devices, elevation, skin care and self MLD,the patient exhibits the following symptoms:hyperpigmentation, skin breakdown with lymphorrhea, fibrosis, progressive edema, chest/axillary swelling and unable to control swelling    Patient requires skilled Physical Therapy services  for the following problem list and secondary functional limitations:    Problem List:   Decreased knowledge of self care of lymphedema  Lack of appropriate compression garment  Increased risk of infections  Increased swelling     Secondary Functional Limitations:  Decreased knowledge of self care can lead to decreased skin integrity and put patient at risk for infections  Lack of a garment can cause increased swelling and increased risk of infection    Patient Goals: Decrease swelling and obtain a compression garment    Physical Therapy Goals:   In 1-3 months: Pt and/or caregiver will:    Decrease swelling in the face and neck to  reduce infection risk, improve overall appearance, and improve QOL      In 6-12 months: Pt and/or caregiver will:  Be independent with home management of self care related to lymphedema to reduce infection risk   Retain optimal reduction in swelling that allows pt to fit into appropriate compression garments, clothing, maintain a more normal appearance, and improve QOL              PLAN:     Planned frequency and duration  of treatment:   1 x week x 12 weeks    Next Visit Plan: check on circumferential measurements ,  garment and pump status,  MLD, MFR, lymphatouch, taping prn        SUBJECTIVE:  Patient???s communication preference: verbal, written, visual, prn   History of Present Illness/ Pt reports:  Getting new face garment next week, still has not heard from pump company, doing self massage but doesn't feel it is helping     Location of pain: no pain        OBJECTIVE:      Posture/Observations:  Forward head, rounded shoulders , mild    Palpation: fibrosis right side of neck       Range of Motion/Flexibilty:   Cervical WFL  UE WFL     Jaw ROM: WFL     Strength/MMT:   Cervical WFL   UE WFL         Girth Measurements: taken in cm          Neck Composite measures:  DATE: 2-9 3-31       Superior (just below mandible)cB at 6 cm below ear  46.2 51         Middle (midway between top and bottom)cD at 9 cm below ear 43.5 48         Inferior (lowest circumferential location)cC at 12cm below ear 43.4 42         Tragus to tragus cA at cm in front of earlobe             Facial Measures:  DATE 2-9 2-9            Right Left  Right Left Right Left Right Left Right Left   Tragus to inner corner of eye F1   13 13.2             Tragus to outer corner of mouth F2   14 13.4             Tragus to mid chin F3   18 17.2             Mandibular angle outer corner of mouth F4                      Location of swelling:  right face ad bilateral neck    Skin condition:  increased skin thickness and radiation fibrosis  Incision/Scar:   reduced scar mobility   myofascial restrictions                 Total Treatment Time: 45    Treatment Rendered:    Patient and/or caregiver and therapist were mask compliant per current [place] COVID mask policy during the entire treatment session. Pt did not wear mask so that MLD could be performed on his face.     Manual x 45 min   Myofascial release/Spontaneous Muscle Release Techniques, STM to affected areas , and MLD to appropriate anastomoses in order  to decrease swelling.  Application of compression garment/devices prn to prevent re-accumulation of fluid.      Advised to wear garment 4-6 hours per day as tolerated, ok to break up into several sessions       Equipment provided/recommended:   N/A    Communication/consultation with other professionals:  N/A    Referrals made to the following providers:  none    Medical Necessity: This treatment is medically necessary throughout the course of this patient's life during and after cancer treatments to improve functional activities and /or to minimize the decline of functional abilities and worsening of symptoms,including infection and recurrent hospitalizations, through independent/home management strategies.  Lymphedema, a chronic, progressive condition for which there is no cure, is marked by the accumulation of protein-rich fluid in one or more quadrants of the body due to primary or secondary disruption of the lymphatic system.  The sustained accumulation results in tissue inflammation, an increase in fatty tissue, and development of obstructive connective tissue.  These changes may result in an increased risk of infection, disfigurement and a decrease in mobility and functional performance.   Pt will benefit from physical therapy by a certified lymphedema therapist to address : education in self care, precautions, and management of symptoms, optimal edema reduction/maintenance of girth, optimal soft tissue changes, fitting of appropriate compression garments, and progression of exercises to optimize functional ROM and strength, balance, and endurance in all ADL's(home, work, recreational, community).        I attest that I have reviewed the above information.  Signed: Clementeen Graham, PT   05/16/2021 8:19 AM

## 2021-05-23 NOTE — Unmapped (Signed)
[facility] Refill Coordination Note    Specialty Medication(s) to be Shipped:   Hematology/Oncology: [name] and [facility]    Other medication(s) to be shipped:  [employer], DOB: [birth year]  Phone: [phone] (home)       All above HIPAA information was verified with patient.     Was a Nurse, learning disability used for this call? No    Completed refill call assessment today to schedule patient's medication shipment from the [facility] [phone]).  All relevant notes have been reviewed.     Specialty medication(s) and dose(s) confirmed: Regimen is correct and unchanged.   Changes to medications: Ingvald reports no changes at this time.  Changes to insurance: No  New side effects reported not previously addressed with a pharmacist or physician: None reported  Questions for the pharmacist: No    Confirmed patient received a Conservation officer, historic buildings and a Surveyor, mining with first shipment. The patient will receive a drug information handout for each medication shipped and additional FDA Medication Guides as required.       DISEASE/MEDICATION-SPECIFIC INFORMATION        N/A    SPECIALTY MEDICATION ADHERENCE     Medication Adherence    Patient reported X missed doses in the last month: 0  Specialty Medication: Braftovi 75mg   Patient is on additional specialty medications: Yes  Additional Specialty Medications: [name] 15mg   Patient Reported Additional Medication X Missed Doses in the Last Month: 0  Patient is on more than two specialty medications: No  Informant: patient              Were doses missed due to medication being on hold? No    Braftovi 75 mg: 10 days of medicine on hand   [name] 15 mg: 10 days of medicine on hand       REFERRAL TO PHARMACIST     Referral to the pharmacist: Not needed      [facility]     Shipping address confirmed in Epic.     Delivery Scheduled: Yes, Expected medication delivery date: 05/29/21.     Medication will be delivered via UPS to the prescription address in Epic [place].    [name] M [name]   [facility] Specialty Technician

## 2021-05-24 ENCOUNTER — Ambulatory Visit
Admit: 2021-05-24 | Payer: PRIVATE HEALTH INSURANCE | Attending: Rehabilitative and Restorative Service Providers" | Primary: Rehabilitative and Restorative Service Providers"

## 2021-05-24 ENCOUNTER — Ambulatory Visit
Admit: 2021-05-24 | Discharge: 2021-05-25 | Payer: PRIVATE HEALTH INSURANCE | Attending: Rehabilitative and Restorative Service Providers" | Primary: Rehabilitative and Restorative Service Providers"

## 2021-05-24 NOTE — Unmapped (Signed)
Garment Follow Up    Purpose of Visit     Kenneth Fitzgerald [facility] was seen today to exchange his chin/facial garments. He was not able to tolerate the half face mask, and in discussing with his therapist, we are switching to a simple chin strap design.       Chief Concern     Here to exchange half face mask for an extended chin strap.      Actions taken today     Donned new garment without incident. Greatly preferred the fit of this compared to the face mask.      Base Device Info   Quantity: 1   Side: Not Applicable   Description: JoviPak Extended ChinStrap   Part Number: [id number]   Serial Number: Lot [id number]       Warranty: 90 Days   Action: Delivered         Goals     Short Term:  Reduce swelling in chin/neck  Reduce pain in chin/neck  Wear device full time  Long Term:  Improve extremity blood flow  Reduce chance of blood clots  Reduce and prevent future swelling  Heal ulcers  Patient Goals:  Increase activity level  Reduce pain and swelling        Assessment   Assessment: Kenneth Fitzgerald was fit without incident. He is to use the garment per his therapist's guidance.       Device checked for safety and security during the visit today.      Instructions provided:   []  Device purpose   []  How to put on, take off and wear the device  []  Cleaning / Maintenance  []  Risks / Benefits of wearing   []  Instructions to contact should issues arise      Plan    Return Visit:  PRN     Authorization expectations:       [employer]:

## 2021-05-25 NOTE — Unmapped (Signed)
[facility] THERAPY SERVICES [place]  OUTPATIENT PHYSICAL THERAPY  05/24/2021  Note Type: Treatment Note       Patient Name: Kenneth Fitzgerald [facility]  Date of Birth:12/28/1975  Diagnosis:   Encounter Diagnoses   Name Primary?    Lymphedema of face Yes    Scar condition and fibrosis of skin      Referring MD:  [name], MD     Date of Onset of Impairment-01/22/2021  Date PT Care Plan Established or Reviewed-02/22/2021  Date PT Treatment Started-02/22/2021   Plan of Care Effective Date:     Session #:  10 of 13      Contraindications:  none  Precautions:  hyper/hypothyroidism  thyroid cancer  Red Flags:  history of cancer melanoma and thyroid , brain mets     Metastatic cancer:   Location: brain    Assessment/Progress toward goals:  Pt is progressing toward meeting goals and Patient's skin integrity is improving.  Skin is softening with treatment which allows for improved lymph uptake, and decreased risk of infection Patient to get new submental garment for face today, so held measurements until he has had a chance to wear it.   Decreased right sided facial swelling today, .    Hopefully, he will be able to tolerate a smaller garment and some foam on facial area.          ASSESSMENT/Reason for Referral:   46 y.o. male presents with longstanding Stage II facial and neck lymphedema. The patient has an extensive CA history, now with brain mets for which he received whole brain radiation.   He had swelling with his initial diagnosis and sought lymphedema treatments at that time.  His swelling has worsened over the years, and he notes lymphorrhea in the right side of his face at times.   He currently does not perform self care.   In addition to lymphedema , the patient is experiencing these side effects from cancer treatments: Radiation: Fibrosis/myofascial restrictions      Recommendations for treatment/garments:  1) Patient will obtain proper garments to address swelling and improve tissue integrity. Garment recommendations :Jovi pak Half face and neck mask for right side of face with universal neck pad  2) Pt will be seen clinically for rehabilitation to include treatments to address as needed: edema/lymphedema, pain, ROM, strength, balance and endurance deficits, as well as learning self care strategies to address these deficits  3) Pt will require a pneumatic compression pump because despite conservative treatment [id number] garments or devices, elevation, skin care and self MLD,the patient exhibits the following symptoms:hyperpigmentation, skin breakdown with lymphorrhea, fibrosis, progressive edema, chest/axillary swelling and unable to control swelling    Patient requires skilled Physical Therapy services  for the following problem list and secondary functional limitations:    Problem List:   Decreased knowledge of self care of lymphedema  Lack of appropriate compression garment  Increased risk of infections  Increased swelling     Secondary Functional Limitations:  Decreased knowledge of self care can lead to decreased skin integrity and put patient at risk for infections  Lack of a garment can cause increased swelling and increased risk of infection    Patient Goals: Decrease swelling and obtain a compression garment    Physical Therapy Goals:   In 1-3 months: Pt and/or caregiver will:    Decrease swelling in the face and neck to reduce infection risk, improve overall appearance, and improve QOL      In 6-12 months: Pt  and/or caregiver will:  Be independent with home management of self care related to lymphedema to reduce infection risk   Retain optimal reduction in swelling that allows pt to fit into appropriate compression garments, clothing, maintain a more normal appearance, and improve QOL              PLAN:     Planned frequency and duration  of treatment:   1 x week x 12 weeks    Next Visit Plan: check on circumferential measurements ,  garment and pump status,  MLD, MFR, lymphatouch, taping prn        SUBJECTIVE:  Patient???s communication preference: verbal, written, visual, prn   History of Present Illness/ Pt reports:  Getting new face garment today, understands to wear for 4-6 hours   Complaining of low back pain, started last week, has been treated for it many times, understands McKenzie exercises    Location of pain: no pain from lymphedema, LBP today        OBJECTIVE:      Posture/Observations:  Forward head, rounded shoulders , mild    Palpation: fibrosis right side of neck       Range of Motion/Flexibilty:   Cervical WFL  UE WFL     Jaw ROM: WFL     Strength/MMT:   Cervical WFL   UE WFL         Girth Measurements: taken in cm          Neck Composite measures:  DATE: 2-9 3-31       Superior (just below mandible)cB at 6 cm below ear  46.2 51         Middle (midway between top and bottom)cD at 9 cm below ear 43.5 48         Inferior (lowest circumferential location)cC at 12cm below ear 43.4 42         Tragus to tragus cA at cm in front of earlobe             Facial Measures:  DATE 2-9 2-9            Right Left  Right Left Right Left Right Left Right Left   Tragus to inner corner of eye F1   13 13.2             Tragus to outer corner of mouth F2   14 13.4             Tragus to mid chin F3   18 17.2             Mandibular angle outer corner of mouth F4                      Location of swelling:  right face ad bilateral neck    Skin condition:  increased skin thickness and radiation fibrosis  Incision/Scar:   reduced scar mobility   myofascial restrictions                 Total Treatment Time: 53    Treatment Rendered:    Patient and/or caregiver and therapist were mask compliant per current [place] COVID mask policy during the entire treatment session. Pt did not wear mask so that MLD could be performed on his face.     Manual x 45 min   Myofascial release/Spontaneous Muscle Release Techniques, STM to affected areas , and MLD to appropriate anastomoses in order to decrease swelling.  Application of  compression garment/devices prn to prevent re-accumulation of fluid. Gentle AP mobilizations to lumbar spine, pt noted decreased pain post treatment   8 min: therapeutic ex: McKenzie extension, press ups, pain centralized post 2 sets of 10 reps     Advised to wear garment 4-6 hours per day as tolerated, ok to break up into several sessions       Equipment provided/recommended:   N/A    Communication/consultation with other professionals:  N/A    Referrals made to the following providers:  none    Medical Necessity: This treatment is medically necessary throughout the course of this patient's life during and after cancer treatments to improve functional activities and /or to minimize the decline of functional abilities and worsening of symptoms,including infection and recurrent hospitalizations, through independent/home management strategies.  Lymphedema, a chronic, progressive condition for which there is no cure, is marked by the accumulation of protein-rich fluid in one or more quadrants of the body due to primary or secondary disruption of the lymphatic system.  The sustained accumulation results in tissue inflammation, an increase in fatty tissue, and development of obstructive connective tissue.  These changes may result in an increased risk of infection, disfigurement and a decrease in mobility and functional performance.   Pt will benefit from physical therapy by a certified lymphedema therapist to address : education in self care, precautions, and management of symptoms, optimal edema reduction/maintenance of girth, optimal soft tissue changes, fitting of appropriate compression garments, and progression of exercises to optimize functional ROM and strength, balance, and endurance in all ADL's(home, work, recreational, community).        I attest that I have reviewed the above information.  SignedClementeen Graham, PT   05/24/2021 6:04 PM

## 2021-05-28 MED FILL — MEKTOVI 15 MG TABLET: ORAL | 30 days supply | Qty: 180 | Fill #7

## 2021-05-28 MED FILL — BRAFTOVI 75 MG CAPSULE: ORAL | 30 days supply | Qty: 180 | Fill #7

## 2021-05-28 MED FILL — SULFASALAZINE 500 MG TABLET: ORAL | 30 days supply | Qty: 120 | Fill #2

## 2021-06-01 MED ORDER — LEVOTHYROXINE 125 MCG TABLET
ORAL_TABLET | 0 refills | 0 days | Status: CP
Start: 2021-06-01 — End: ?

## 2021-06-01 NOTE — Unmapped (Signed)
Rx pended to provider. Messaged pt to schedule a follow-up appointment.

## 2021-06-08 DIAGNOSIS — E278 Other specified disorders of adrenal gland: Principal | ICD-10-CM

## 2021-06-08 MED ORDER — CALCITRIOL 0.25 MCG CAPSULE
ORAL_CAPSULE | Freq: Every day | ORAL | 0 refills | 90 days | Status: CP
Start: 2021-06-08 — End: ?

## 2021-06-08 MED ORDER — MEMANTINE 10 MG TABLET
ORAL_TABLET | 4 refills | 0 days | Status: CP
Start: 2021-06-08 — End: ?

## 2021-06-08 NOTE — Unmapped (Signed)
Messaged pt to schedule a follow-up appointment.

## 2021-06-08 NOTE — Unmapped (Signed)
Patient need refill if appropriate.     Most recent clinic visit: 04/04/2021  Next clinic visit:07/05/2021

## 2021-06-13 NOTE — Unmapped (Signed)
[facility] Refill Coordination Note    Specialty Medication(s) to be Shipped:   Hematology/Oncology: Kenneth Fitzgerald and Kenneth Fitzgerald    Other medication(s) to be shipped:  Kenneth Fitzgerald, DOB: Jul 07, 1975  Phone: [phone] (home)       All above HIPAA information was verified with patient.     Was a Nurse, learning disability used for this call? No    Completed refill call assessment today to schedule patient's medication shipment from [facility] [phone]).  All relevant notes have been reviewed.     Specialty medication(s) and dose(s) confirmed: Regimen is correct and unchanged.   Changes to medications: Kenneth Fitzgerald reports no changes at this time.  Changes to insurance: No  New side effects reported not previously addressed with a pharmacist or physician: Yes - Patient reports joint pain. Patient would not like to speak to Kenneth pharmacist today. Their provider is aware.  Questions for Kenneth pharmacist: No    Confirmed patient received a Conservation officer, historic buildings and a Surveyor, mining with first shipment. Kenneth patient will receive a drug information handout for each medication shipped and additional FDA Medication Guides as required.       DISEASE/MEDICATION-SPECIFIC INFORMATION        N/A    SPECIALTY MEDICATION ADHERENCE     Medication Adherence    Patient reported X missed doses in Kenneth last month: 0  Specialty Medication: Braftovi 75mg   Patient is on additional specialty medications: Yes  Additional Specialty Medications: [name] 15mg   Patient Reported Additional Medication X Missed Doses in Kenneth Last Month: 0  Patient is on more than two specialty medications: No  Informant: patient              Were doses missed due to medication being on hold? No    Braftovi 75 mg: 14 days of medicine on hand   [name] 15 mg: 14 days of medicine on hand       REFERRAL TO PHARMACIST     Referral to Kenneth pharmacist: Not needed      [facility]     Shipping address confirmed in Epic.     Delivery Scheduled: Yes, Expected medication delivery date: 06/29/21.     Medication will be delivered via UPS to Kenneth prescription address in Epic [place].    Kenneth Fitzgerald   [facility] Specialty Technician

## 2021-06-14 ENCOUNTER — Ambulatory Visit: Admit: 2021-06-14 | Payer: PRIVATE HEALTH INSURANCE | Attending: Clinical | Primary: Clinical

## 2021-06-21 ENCOUNTER — Telehealth: Admit: 2021-06-21 | Discharge: 2021-06-22 | Payer: PRIVATE HEALTH INSURANCE | Attending: Clinical | Primary: Clinical

## 2021-06-21 NOTE — Unmapped (Signed)
Civil case done  Scans on June 22nd  Experiencing Intermittent pain; treated with Ibuprofen prn next??after his scans.  ??  Risk Assessment:  A suicide and violence risk assessment was performed as part of this evaluation. There patient is deemed to be at chronic elevated risk??for self-harm/suicide given the following factors: current diagnosis of depression, suicidal ideation or threats without a plan and past diagnosis of depression. There patient is deemed to be at chronic elevated risk??for violence given the following factors: male gender. These risk factors are mitigated by the following factors: lack of active SI/HI. There is no acute risk for suicide or violence at this time. The patient was educated about relevant modifiable risk factors including following recommendations for treatment of psychiatric illness and abstaining from substance abuse.??While future psychiatric events cannot be accurately predicted, the patient does not currently require acute inpatient psychiatric care and does not currently meet [facility] involuntary commitment criteria.??????????????????  ??  Plan:  1.??I will continue to see pt for psychotherapy. ??Next session??is??scheduled??to take place after his scans.  2.??Kenneth Fitzgerald??has??information on available [facility] resources.??Pt has worked with [name], [name]. ??I have??previously??reached out to [name] to explore financial resources.  3.??Kenneth Fitzgerald??continues to take??Remeron.  4. Pt has??my contact information and knows??to reach me as needed.  ??  Subjective:??  Kenneth Fitzgerald??presented as alert,??oriented, and he was engaged.????He presented in good spirits and was not visibly anxious or in distress during our session.    Kenneth Fitzgerald was pleased to report that the long-standing civil legal matter was recently resolved. Since then, he has felt less stressed and generally been in a better mood.  He also described feeling that he can move forward with plans that have been put on hold for some time.  Kenneth Fitzgerald denied experiencing significant depressive symptoms at this time.  He is experiencing some anticipatory anxiety about scans scheduled for later this month.  ??  I provided Kenneth Fitzgerald support and understanding,??and we processed his thoughts,??feelings, and experiences in depth??- particularly those related to legal and medical stressors.?? We??reviewed and??problem-solved steps that he can take to address remaining distress and to enact plans to live in accordance with his priorities and not encumbered by legal stressors.  Mr.??Fitzgerald was??engaged during our session as usual??and my sense remains that he benefits from therapy. ??We scheduled to meet next following his scans.  ??  Mental Status Exam:  Appearance:  ?? ??Appears stated age   Motor: ?? ??No abnormal movements   Speech/Language:  ?? ??Normal rate, volume, tone, fluency   Mood: ?? better   Affect: ?? ??engaged; relatively bright   Thought process: ?? ??Logical, linear, clear, coherent, goal-directed   Thought content: ?? ?? ??Denies SI/HI or thought of self-harm   Perceptual disturbances: ?? ?? ??Behavior not concerning for response to internal stimuli  ??   Orientation: ?? ??Oriented to person, place, time, and general circumstances   Attention: ?? ??Able to fully attend without fluctuations in consciousness   Concentration: ?? ??Able to fully concentrate and attend   Memory: ?? ??Immediate, short-term, long-term, and recall grossly intact   ??Fund of knowledge:  ?? ??Consistent with level of education and development   Insight: ?? ?? ??Intact   Judgment:  ?? ??Intact   Impulse Control: ?? ??Intact   ??  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  Kenneth Fitzgerald. Kenneth Cuadras PhD  June 22, 2021

## 2021-06-28 MED FILL — SULFASALAZINE 500 MG TABLET: ORAL | 30 days supply | Qty: 120 | Fill #3

## 2021-06-28 MED FILL — BRAFTOVI 75 MG CAPSULE: ORAL | 30 days supply | Qty: 180 | Fill #8

## 2021-06-28 MED FILL — MEKTOVI 15 MG TABLET: ORAL | 30 days supply | Qty: 180 | Fill #8

## 2021-07-05 ENCOUNTER — Ambulatory Visit: Admit: 2021-07-05 | Discharge: 2021-07-05 | Payer: PRIVATE HEALTH INSURANCE

## 2021-07-05 ENCOUNTER — Other Ambulatory Visit: Admit: 2021-07-05 | Discharge: 2021-07-05 | Payer: PRIVATE HEALTH INSURANCE

## 2021-07-05 ENCOUNTER — Ambulatory Visit
Admit: 2021-07-05 | Discharge: 2021-07-05 | Payer: PRIVATE HEALTH INSURANCE | Attending: Hematology & Oncology | Primary: Hematology & Oncology

## 2021-07-05 DIAGNOSIS — C439 Malignant melanoma of skin, unspecified: Principal | ICD-10-CM

## 2021-07-05 DIAGNOSIS — R3915 Urgency of urination: Principal | ICD-10-CM

## 2021-07-05 LAB — CBC W/ AUTO DIFF
BASOPHILS ABSOLUTE COUNT: 0.1 10*9/L (ref 0.0–0.1)
BASOPHILS RELATIVE PERCENT: 0.9 %
EOSINOPHILS ABSOLUTE COUNT: 0.3 10*9/L (ref 0.0–0.5)
EOSINOPHILS RELATIVE PERCENT: 3.3 %
HEMATOCRIT: 37.7 % — ABNORMAL LOW (ref 39.0–48.0)
HEMOGLOBIN: 12.8 g/dL — ABNORMAL LOW (ref 12.9–16.5)
LYMPHOCYTES ABSOLUTE COUNT: 1.2 10*9/L (ref 1.1–3.6)
LYMPHOCYTES RELATIVE PERCENT: 15.2 %
MEAN CORPUSCULAR HEMOGLOBIN CONC: 34 g/dL (ref 32.0–36.0)
MEAN CORPUSCULAR HEMOGLOBIN: 27.8 pg (ref 25.9–32.4)
MEAN CORPUSCULAR VOLUME: 81.7 fL (ref 77.6–95.7)
MEAN PLATELET VOLUME: 8.6 fL (ref 6.8–10.7)
MONOCYTES ABSOLUTE COUNT: 0.4 10*9/L (ref 0.3–0.8)
MONOCYTES RELATIVE PERCENT: 5 %
NEUTROPHILS ABSOLUTE COUNT: 6 10*9/L (ref 1.8–7.8)
NEUTROPHILS RELATIVE PERCENT: 75.6 %
PLATELET COUNT: 239 10*9/L (ref 150–450)
RED BLOOD CELL COUNT: 4.61 10*12/L (ref 4.26–5.60)
RED CELL DISTRIBUTION WIDTH: 17.3 % — ABNORMAL HIGH (ref 12.2–15.2)
WBC ADJUSTED: 7.9 10*9/L (ref 3.6–11.2)

## 2021-07-05 LAB — COMPREHENSIVE METABOLIC PANEL
ALBUMIN: 3.9 g/dL (ref 3.4–5.0)
ALKALINE PHOSPHATASE: 82 U/L (ref 46–116)
ALT (SGPT): 42 U/L (ref 10–49)
ANION GAP: 9 mmol/L (ref 5–14)
AST (SGOT): 34 U/L (ref <=34)
BILIRUBIN TOTAL: 0.3 mg/dL (ref 0.3–1.2)
BLOOD UREA NITROGEN: 17 mg/dL (ref 9–23)
BUN / CREAT RATIO: 16
CALCIUM: 8.5 mg/dL — ABNORMAL LOW (ref 8.7–10.4)
CHLORIDE: 105 mmol/L (ref 98–107)
CO2: 25 mmol/L (ref 20.0–31.0)
CREATININE: 1.09 mg/dL
EGFR CKD-EPI (2021) MALE: 85 mL/min/{1.73_m2} (ref >=60)
GLUCOSE RANDOM: 123 mg/dL (ref 70–179)
POTASSIUM: 4 mmol/L (ref 3.4–4.8)
PROTEIN TOTAL: 7.1 g/dL (ref 5.7–8.2)
SODIUM: 139 mmol/L (ref 135–145)

## 2021-07-05 LAB — SLIDE REVIEW

## 2021-07-05 LAB — URINALYSIS WITH MICROSCOPY WITH CULTURE REFLEX
BACTERIA: NONE SEEN /HPF
BILIRUBIN UA: NEGATIVE
BLOOD UA: NEGATIVE
GLUCOSE UA: NEGATIVE
KETONES UA: NEGATIVE
LEUKOCYTE ESTERASE UA: NEGATIVE
NITRITE UA: NEGATIVE
PH UA: 7 (ref 5.0–9.0)
PROTEIN UA: NEGATIVE
RBC UA: <1 /HPF (ref <=3)
SPECIFIC GRAVITY UA: 1.021 (ref 1.003–1.030)
SQUAMOUS EPITHELIAL: <1 /HPF (ref 0–5)
UROBILINOGEN UA: <2
WBC UA: <1 /HPF (ref <=2)

## 2021-07-05 MED ADMIN — gadobenate dimeglumine (MULTIHANCE) 529 mg/mL (0.1mmol/0.2mL) solution 20 mL: 20 mL | INTRAVENOUS | @ 15:00:00 | Stop: 2021-07-05

## 2021-07-05 NOTE — Unmapped (Unsigned)
Labs drawn from pre-existing PIV & sent for analysis.  Line flushed & saline-locked.  PIV removed.  To next appt.  Care provided by Jenean Lindau RN.

## 2021-07-05 NOTE — Unmapped (Signed)
Urine specimen collected in the clinic per MD order. Urine specimen tubed to the lab.

## 2021-07-05 NOTE — Unmapped (Signed)
PRIMARY CARE PHYSICIAN  TIMOTHY R DRAPER, DO  [address]  [place] [address]    CONSULTING PHYSICIANS  Ralene Cork, Do  [address]  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   -S/p thyroidectomy 06/2011 and radioactive iodine 03/2012 and again 05/2013.    -Thyroid CA recurrence 11/2012 s/p L neck dissecion 12/07/2012.  -2nd thyroid CA recurrence 03/2013 AND co-existing 1st melanoma recurrence 03/2013 s/p R -cervical level 2-4 node dissection and partial parotidectomy  -S/p radiation to R neck, completed 06/2013  -Nivolumab (02/12/18 x 2 cycles) complicated by colitis that required a 6 week steroid taper.   -s/p WBRT, completed 01/20/20    CURRENT THERAPY:   BRAF/MEK started early May 2020    ASSESSMENT: [name] is a pleasant 46 y.o. male who presents today for further evaluation of recurrent stage IV melanoma. 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    PLAN  1. Stage IV melanoma: BRAF+ by IHC. Dr Eyvonne Left reviewed path: TILS present, non-brisk; lymphocytes are 20 to 30% the number of melanoma cells.  Known sites of disease include abdominal/pelvic LN, and brain. Started dabrafenib/trametinib mid-May 2020, has had stable disease extracranially.      - Reviewed path from hernia surgery in Jan 2022, noted that the abdominal wall/peritoneal implants contained melanophages, but no melanoma cells.  We discussed that this is interesting, good to know that there wasn't melanoma in the biopsy, is in keeping w/immune response.    --Personally reviewed CT chest, MRI brain, abdomen today. The abdominal findings seem more c/w inflammation, atypical Crohn's, especially in setting of slight increased BRBPR.  Will reach out to GI medicine doctor.   - He is back on BRAF/MEK targeted therapy.  Will continue.   - He is s/p WBRT, completed 01/20/20. MRI brain from today personally reviewed and c/w stable disease.  Rad/onc is following.  -clinically, he is doing very well, much improved over time.  -RTC in 12 weeks for clinical follow up and repeat imaging.  -Of note, we have attempted to confirm BRAF on NGS twice, on original primary specimen from July 2012 and and pelvic LN sample from 01/09/2018.  Both samples were insufficient tissue for analysis.  At this point, BRAF is known by IHC only.     #.) Brain mets; s/p WBRT as per above.  Brain MRI from today stable.  Asx at this time.  Rad/onc following.    #.) New urinary; UA WNL.  Urology recommended as next step.    #) L ear fullness, hearing loss; ENT following.  Is better s/p PE tube placement, since fallen out.     #.) LBP, radiculopathy, MRI abdomen 01/03/21 findings w/disc bulge   - imaging shows improvement in the L sided low back findings and pt reports symptom improvement.    #.) Atypical Crohn's; recent colonoscopy w/ulcerations and crypt abscesses.  Cont sulfasalazine per GI medicine.  - Will reach out to Dr. Stevphen Rochester as per above, given GI symptoms and MRI findings.    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 During hospital stay, it was determined that this was malignant thrombus and no AC recommended.  Will follow.     #.) Hernia repair; all scars healed and he is eating and drinking normally.  Surgeon aware  abdominal wall finding, CTM.     #.) Thyroid CA, recurrent. Well controlled now. Followed by endocrinology for thyroid and Ca+ replacement and ongoing monitoring of glucose.    #.) Mood; excessive stress related to h/o 2 malignancies, job loss and legal issues surrounding, parents both passed away in recent years.   He follows w/ Drs. Yopp and [facility].     #.) HypoCA; he struggles w/compliance during the workday,cont Ca supplementation    #.) Supportive care/GOC;appreciate pall care support and med titration. Discussed w/[name]; OK to pause pall care for now given well controlled disease and well controlled symptoms.      INTERVAL HISTORY: This is a return visit to the [facility] for further evaluation of melanoma.  Notes 3-4 weeks of urinary urgency.  No worse over time.  No burning.   No incontinence.    Notes blood in stool x 3-4 weeks. Has h/o hemorrhoids. Notes on TP, not filling the bowl.  No constipation.  1-2BMs/day.  No FHx colon CA.    Feels well otherwise, is working full time.    Very tired at the end of the day.   Weight is stable.    No new pains, joint pains unchanged, migratory.   PE tube is out, but hearing well.    HA/scalp pain much better.  No new CNS symptoms.     Completed PT for neck lymphedema, this is helping some.  Bowels are normal for him; recent diagnosis of atypical crohn's disease, continues on sulfasalazine 2 tabs BID  Follows w/Drs. [facility] and [facility], finds this helpful. Mood is good lately.       PAST MEDICAL HISTORY:   1. Diverticulitis.   2. History of ankylosing spondylitis in 2009. Please see past medical history dictated on 11/15/2010 for details. In brief, he was treated w/Enbrel and prednisone for 6mos or so.  Symptoms improved, immunosuppressants were stopped and symptoms did not recur.   3. S/p incisional hernia repair       SOCIAL HISTORY:    reports that he has never smoked. He has never used smokeless tobacco. He reports current alcohol use. He reports that he does not use drugs.      MEDICATIONS:   Current Outpatient Medications   Medication Sig Dispense Refill    acetaminophen (TYLENOL) 500 MG tablet Take 2 tablets (1,000 mg total) by mouth every eight (8) hours as needed for pain. 30 tablet 0    binimetinib ([name]) 15 mg tablet Take 3 tablets (45 mg total) by mouth Two (2) times a day. 180 tablet 11    blood sugar diagnostic Strp Dispense 100 blood glucose test strips, ok to sub any brand preferred by insurance/patient, use 3x/day; dispense whatever brand matches with meter. 100 strip 12    blood-glucose meter kit Use as instructed; dispense 1 meter, whatever is preferred by insurance 1 each 1    calcitrioL (ROCALTROL) 0.25 MCG capsule Take 1 capsule (0.25 mcg total) by mouth daily. You will need to make appointment for further refills. Please go on MyChart to schedule the visit or call [phone]. 90 capsule 0    calcium carbonate 650 mg calcium (1,625 mg) tablet Take 1 tablet (650 mg of elem calcium total) by mouth Three (3) times a day with a meal.      DULoxetine (CYMBALTA) 30 MG capsule Take 1 capsule (30 mg total) by mouth Two (2) times a day. 180 capsule 2    encorafenib (BRAFTOVI)  75 mg capsule Take 6 capsules (450 mg total) by mouth daily. 180 capsule 11    fexofenadine (ALLEGRA ALLERGY) 180 MG tablet Take 1 tablet (180 mg total) by mouth daily as needed (allergies).      fluticasone propionate (FLONASE) 50 mcg/actuation nasal spray 2 sprays into each nostril two (2) times a day. 16 g 6    folic acid (FOLVITE) 1 MG tablet TAKE 1 TABLET BY MOUTH EVERY DAY 90 tablet 3    ibuprofen (ADVIL,MOTRIN) 200 MG tablet Take 3 tablets (600 mg total) by mouth daily as needed for pain.      lancets Misc Dispense 100 lancets, ok to sub any brand preferred by insurance/patient, use 3x/day 100 each 12    levothyroxine (SYNTHROID) 125 MCG tablet Take 375 mcg daily; You will need to make appointment for further refills. Please go on MyChart to schedule the visit or call [phone]. 270 tablet 0    memantine (NAMENDA) 10 MG tablet Take 1 tablet (10 mg total) by mouth daily.      memantine (NAMENDA) 10 MG tablet TAKE 1 TABLET BY MOUTH TWICE A DAY 180 tablet 4    mirtazapine (REMERON) 7.5 MG tablet Take 1 tablet (7.5 mg total) by mouth nightly. 90 tablet 3    ofloxacin (OCUFLOX) 0.3 % ophthalmic solution Instill 4 drops in left ear twice daily for 5 days 5 mL 3    ondansetron (ZOFRAN) 8 MG tablet Take 1 tablet (8 mg total) by mouth Take as directed.      sulfaSALAzine (AZULFIDINE) 500 mg tablet Take 2 tablets (1,000 mg total) by mouth two (2) times a day. 120 tablet 5    terbinafine HCL (LAMISIL) 250 mg tablet Take 1 tablet (250 mg total) by mouth in the morning.       No current facility-administered medications for this visit.       REVIEW OF SYSTEMS  A complete review of systems was obtained and was negative except for those symptoms listed in the HPI     PHYSICAL EXAM  Vitals:    07/05/21 1428   BP: 137/88   Pulse: 96   Resp: 20   Temp: 36.7 ??C (98.1 ??F)   SpO2: 98%         GEN: Awake and alert, pleasant appearing male in no acute distress  LUNGS: normal WOB, CTAB  CV: NAD, S1S2  ABD; obese, soft NTND  SKIN: No rashes, petechiae or jaundice noted.  All melanoma scarlines examined; no nodularity.    PYSCH: Alert and oriented to person, place and time  EXT: No edema noted of the lower extremity noted.          LABS  Lab Results   Component Value Date    WBC 7.9 07/05/2021    HGB 12.8 (L) 07/05/2021    HCT 37.7 (L) 07/05/2021    PLT 239 07/05/2021       Lab Results   Component Value Date    NA 139 07/05/2021    K 4.0 07/05/2021    CL 105 07/05/2021    CO2 25.0 07/05/2021    BUN 17 07/05/2021    CREATININE 1.09 07/05/2021    GLU 123 07/05/2021    CALCIUM 8.5 (L) 07/05/2021    MG 1.8 02/01/2020    PHOS 5.8 (H) 05/10/2020       Lab Results   Component Value Date    BILITOT 0.3 07/05/2021    BILIDIR 0.20 01/27/2020    PROT  7.1 07/05/2021    ALBUMIN 3.9 07/05/2021    ALT 42 07/05/2021    AST 34 07/05/2021    ALKPHOS 82 07/05/2021    GGT 42 08/22/2011       Lab Results   Component Value Date    INR 1.12 01/24/2020       RADIOLOGY RESULTS;  CT chest 07/05/21:  Impression       New sparse micronodules. No nodules.         MRI abdomen 07/05/21:  Impression       -- As compared to 04/04/2021, similar appearance of mildly T1 hyperintense lesions within hepatic segments V and VIII as well as hepatic capsular soft tissue implants, as described within the body of the report.       -- Multiple prominent perirectal, superior rectal chain and sigmoid mesorectal lymph nodes measuring up to 0.8 cm are identified (series 31 image 31, 44, 60) which are not seen on the prior MRI from 10/04/2020. These are indeterminant and could also be reactive although metastatic disease cannot be excluded. Clinical correlation is recommended. Attention on short-term follow-up. Alternatively, further assessment with PET/CT could also be considered.       -- The rectum demonstrates mild circumferential wall thickening with edema. This finding could be secondary to proctitis. Clinical correlation is recommended. If there is high clinical concern, further assessment with colonoscopy could be considered.        Brain MRI 07/05/21:  Impression   Unchanged innumerable micrometastases throughout the supratentorial and infratentorial brain.       Echo 04/29/19:  Summary    1. The left ventricle is normal in size with normal wall thickness.    2. The left ventricular systolic function is normal, LVEF is visually  estimated at 55%.    3. The right ventricle is upper normal in size, with normal systolic  function.          PATHOLOGY:  Pelvic LN 01/09/18:  Final Diagnosis   A: Colon, transverse, biopsy  - Moderately-severely active chronic colitis  - No CMV viral cytopathic effect, granuloma, or dysplasia identified  - No metastatic melanoma or papillary thyroid cancer identified  - See comment     B: Lymph node, pelvic, core needle biopsy  - Metastatic melanoma  - See comment        FNA R femur 07/19/14:  : Bone, right femur, core biopsy with touch imprints  - No primary or metastatic malignancy identified  - Bone with focal osteocyte dropout, suggestive of necrosis  - Bone marrow with trilineage hematopoiesis and focal effacement by chronic inflammation (see comment)    B: Bone, right femur, fine needle aspiration  - Few atypical cells present on direct smears (see comment)  - Scant fragments of edematous stromal tissue in cell block  - Bone marrow with trilineage hematopoiesis    Diagnosis:  Lymph nodes, right cervical, levels 2-4, removal and partial parotidectomy  -Metastatic melanoma involving 1 out of 20 lymph nodes, with the largest  diameter measuring 10 mm (1.0 cm) and no evidence of extracapsular extension  (1/20)  -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

## 2021-07-09 ENCOUNTER — Telehealth: Admit: 2021-07-09 | Discharge: 2021-07-10 | Payer: PRIVATE HEALTH INSURANCE | Attending: Clinical | Primary: Clinical

## 2021-07-09 NOTE — Unmapped (Signed)
Returned call to pt. States he is requesting a call back concerning scan results. Let him know I will relay the message to Dr. Nedra Hai.He was agreeable to this plan and will call back with any further needs.

## 2021-07-09 NOTE — Unmapped (Signed)
Hi,     Patient contacted the Communication Center requesting results of the following:     Procedure: CT and MRI  Completed On: 07/05/21    Please contact Arlin Sass [facility] at [phone] for proper follow up.    Check Indicates criteria has been reviewed and confirmed with the patient:    [x]  Preferred Name   [x]  DOB and/or MR#  [x]  Preferred Contact Method  [x]  Phone Number(s)   []  MyChart     Thank you,   Christell Faith  [facility]   [phone]

## 2021-07-10 ENCOUNTER — Ambulatory Visit
Admit: 2021-07-10 | Discharge: 2021-07-13 | Payer: PRIVATE HEALTH INSURANCE | Attending: Radiation Oncology | Primary: Radiation Oncology

## 2021-07-10 DIAGNOSIS — K5010 Crohn's disease of large intestine without complications: Principal | ICD-10-CM

## 2021-07-10 NOTE — Unmapped (Signed)
Patient with abnormal MRI showing rectal wall thickening and prominent mesorectal lymph node 0.8cm. Recommend colonoscopy to check for Crohn's vs. Colon cancer vs. Melanoma recurrence.

## 2021-07-10 NOTE — Unmapped (Signed)
Pt discharged today, did not return for follow up visits.

## 2021-07-10 NOTE — Unmapped (Signed)
[facility]  Psychiatry  Psychotherapy Note - Telehealth via Video     Service Date: July 09, 2021  Service: 50 minutes of psychotherapy via video session  Time with Patient: 50 minutes     Encounter Description: This encounter was conducted from provider's office via EPIC video session due to COVID-19 pandemic. Juandavid Dallman was located in his home. Session was conducted via EPIC.  Rationale for video session is need to social distance. See Plan for telemedicine consent/disclaimer.      Encounter Description/Consent:   [name] visit was completed through telehealth encounter (video).      This patient encounter is appropriate and reasonable under the circumstances. The patient has been advised of the potential risks and limitations of this mode of treatment (including, but not limited to, the absence of in-person examination) and has agreed to be treated in a remote fashion in spite of them. Any and all of the patient's/patient's family's questions on this issue have been answered.      The patient was physically located in [place] in which I am permitted to provide care. The patient understood that he may incur co-pays and cost sharing, and agreed to the telemedicine visit. The visit was reasonable and appropriate under the circumstances given the patient's presentation at the time.      Time Spent: 50 minutes     Assessment:  Mr. Ramsay is a 46yo male with metastatic melanoma, h/o thyroid cancer, recent portal vein thrombosis, who I see in therapy to address depression and enhance coping with multiple stressors.  I met with Mr. Lessley today via telehealth.  Mr. Jablon endorsed anxiety about pending scan results from last week.  No emergent concerns noted. I offered psychotherapy, support, and recommendations for addressing anxiety, and toward building off recent gains in his overall psychosocial functioning.  Will continue to follow.     Risk Assessment:  A suicide and violence risk assessment was performed as part of this evaluation. There patient is deemed to be at chronic elevated risk for self-harm/suicide given the following factors: current diagnosis of depression, suicidal ideation or threats without a plan and past diagnosis of depression. There patient is deemed to be at chronic elevated risk for violence given the following factors: male gender. These risk factors are mitigated by the following factors: lack of active SI/HI. There is no acute risk for suicide or violence at this time. The patient was educated about relevant modifiable risk factors including following recommendations for treatment of psychiatric illness and abstaining from substance abuse. While future psychiatric events cannot be accurately predicted, the patient does not currently require acute inpatient psychiatric care and does not currently meet [facility] involuntary commitment criteria.              Plan:  1. I will continue to see pt for psychotherapy. He will contact me with times for our next session.  2. Mr. Rybicki has information on available CCSP resources. Pt has worked with [name], [name].  I have previously reached out to [name] to explore financial resources.  3. Mr. Mo continues to take Remeron.  4. Pt has my contact information and knows to reach me as needed.     Subjective:   Mr. Daughety presented as alert, oriented, and he was engaged.  He presented as sw anxious, which was consistent with his verbal report.    Mr. Kiser endorsed anxiety related to his recent scans results that he received via MyChart.  He noted that the results looked concerning, but is awaiting feedback from Dr. Nedra Hai to more fully understand the results.  He described some perseveration and trying to stay distracted; he denied panic attacks. Mr. Bumgarner cited some lower mood related to this anxiety in ways that feel in proportion to the situation.  He denied most symptoms of depression.       I provided Mr. Schiavo support and understanding, and we processed his thoughts, feelings, and experiences in some depth.  We discussed and problem-solved ways to tolerate unknowns for now and to manage health-related anxiety.  Mr. Veltre was engaged during our session as usual and my sense remains that he benefits from therapy.    Will continue to follow.     Mental Status Exam:  Appearance:     Appears stated age   Motor:    No abnormal movements   Speech/Language:     Normal rate, volume, tone, fluency   Mood:   I'm a little worried   Affect:    sw anxious; engaged   Thought process:    Logical, linear, clear, coherent, goal-directed   Thought content:      Denies SI/HI or thought of self-harm   Perceptual disturbances:      Behavior not concerning for response to internal stimuli      Orientation:    Oriented to person, place, time, and general circumstances   Attention:    Able to fully attend without fluctuations in consciousness   Concentration:    Able to fully concentrate and attend   Memory:    Immediate, short-term, long-term, and recall grossly intact    Fund of knowledge:     Consistent with level of education and development   Insight:      Intact   Judgment:     Intact   Impulse Control:    Intact      Diagnosis: Recurrent major depressive disorder in partial remission;  Anxiety associated with cancer diagnosis     [name]. Jahliyah Trice PhD  June 22, 2021

## 2021-07-11 NOTE — Unmapped (Signed)
Radiation Oncology Telemedicine Follow Up Visit    Encounter Date: 07/10/2021  Patient Name: Kenneth Fitzgerald Kenneth Fitzgerald  Medical Record Number: [id number]  Patient identity verbally confirmed: Yes.  Verbal consent for telemedicine encounter: Yes.  Location of patient:  [address]  [address] [place] [address]   Location of provider [place], [place]): [place], [place]  Encounter medium: audio  Software platform: Telephone  Attendees (and relation to patient): Patient, Dr. Flonnie Hailstone  Total time: 10 minutes      IDENTIFICATION:  Kenneth Fitzgerald is a 47 y.o. man with a history of multiply recurrent thyroid cancer treated with RAI (05/2013) and post-op EBRT to R neck to 48 Gy (completed 06/2013), as well as stage III scalp melanoma diagnosed in 2012, pT2pN2, s/p WLE and 3/3 positive SLN's, completion right level 2 neck dissection on 11/06/10 (0/25), one year of interferon completed 01/2012, and then right neck dissection 03/29/13 showing 1/20 nodes (1cm with no ECE).      He has had stable extracranial disease on dabrafenib/trametinib and was noted to have multiple punctate lesions (best seen on T1 noncontrast sequence) in the brain suspicious for metastasis that were increasing in number since January 2020.  He completed WBRT to 30 Gy on 01/20/20.       DURATION SINCE COMPLETION OF RADIOTHERAPY:  1 year 5 months      ASSESSMENT:   Disease Status:  Intracranial--stable, no evidence of progression (MRI brain 07/05/21); Extracranial--no clear evidence of progression, indeterminate perirectal/mesorectal lymph nodes and new sparse lung micronodules (CT chest and MRI abd/pelvis 07/05/21)  Side effects: Memory issues, sinus/ear congestion     RECOMMENDATIONS:  FOLLOW-UP: 3 months with MRI brain (already ordered by Dr. Nedra Hai)  2.   Continue memantine  3.   Continue BRAF/MEK inhibitor per Dr. Nedra Hai     INTERVAL HISTORY:    Overall feeling well, continues to work ~45 hours/week.    Mild vision changes (difficulty with distance), both eyes (since around the time of radiation)    Sinuses very congested, has had ear fullness, uses Flonase regularly  L ear tube got dislodged and was removed, hasn't been replaced but ear is doing better    Still having some memory issues, doing brain training exercises, still taking memantine       REVIEW OF SYSTEMS:  A comprehensive review of 10 systems was negative except for pertinent positives noted in HPI.      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)    Malignant neoplasm of thyroid gland (CMS-HCC)    Postoperative hypothyroidism    Hypocalcemia    Thyroid cancer (CMS-HCC)    Malignant melanoma, metastatic (CMS-HCC)    Ventral hernia with bowel obstruction    Insomnia    Crohn's disease of large intestine without complication (CMS-HCC)    Malignant neoplasm of brain, unspecified location (CMS-HCC)    Gout    01/24/2020: Open, primary ventral hernia repair for SBO    Morbid obesity with BMI of 40.0-44.9, adult (CMS-HCC)       PAST MEDICAL HISTORY/FAMILY HISTORY/SOCIAL HISTORY:  Reviewed in EPIC    ALLERGIES/MEDICATIONS:  Reviewed in EPIC      OBJECTIVE:  Karnofsky/Lansky Performance Status: 90,  Able to carry on normal activity; minor signs or symptoms of disease (ECOG equivalent 0)  Vitals: Not performed/obtained  General: No acute distress, alert and oriented  Pulm: No increased work of breathing, speaking in full  sentences  Neuro:  Speech clear/fluent, no expressive aphasia, comprehension full  Psych: Normal mood and affect        The patient reports they are currently: at home. I spent 5 minutes on the phone with the patient on the date of service. I spent an additional 5 minutes on pre- and post-visit activities on the date of service.     The patient was physically located in [place] or a state in which I am permitted to provide care. The patient and/or parent/guardian understood that s/he may incur co-pays and cost sharing, and agreed to the telemedicine visit. The visit was reasonable and appropriate under the circumstances given the patient's presentation at the time.    The patient and/or parent/guardian has been advised of the potential risks and limitations of this mode of treatment (including, but not limited to, the absence of in-person examination) and has agreed to be treated using telemedicine. The patient's/patient's family's questions regarding telemedicine have been answered.     If the visit was completed in an ambulatory setting, the patient and/or parent/guardian has also been advised to contact their provider???s office for worsening conditions, and seek emergency medical treatment and/or call 911 if the patient deems either necessary.             Mina Marble, MD, PhD  Department of Radiation Oncology  [facility]  [id number]  [place], [place] [address]  O: [phone]

## 2021-07-18 NOTE — Unmapped (Signed)
[facility] Refill Coordination Note    Specialty Medication(s) to be Shipped:   Hematology/Oncology: [name] and [facility]    Other medication(s) to be shipped:  [employer], DOB: 1975-02-22  Phone: [phone] (home)       All above HIPAA information was verified with patient.     Was a Nurse, learning disability used for this call? No    Completed refill call assessment today to schedule patient's medication shipment from the [facility] [phone]).  All relevant notes have been reviewed.     Specialty medication(s) and dose(s) confirmed: Regimen is correct and unchanged.   Changes to medications: Awais reports no changes at this time.  Changes to insurance: No  New side effects reported not previously addressed with a pharmacist or physician: None reported  Questions for the pharmacist: No    Confirmed patient received a Conservation officer, historic buildings and a Surveyor, mining with first shipment. The patient will receive a drug information handout for each medication shipped and additional FDA Medication Guides as required.       DISEASE/MEDICATION-SPECIFIC INFORMATION        N/A    SPECIALTY MEDICATION ADHERENCE     Medication Adherence    Patient reported X missed doses in the last month: 0  Specialty Medication: Braftovi 75mg   Patient is on additional specialty medications: Yes  Additional Specialty Medications: [name] 15mg   Patient Reported Additional Medication X Missed Doses in the Last Month: 0  Patient is on more than two specialty medications: No  Informant: patient          Were doses missed due to medication being on hold? No    Braftovi 75 mg: 14 days of medicine on hand   [name] 15 mg: 14 days of medicine on hand       REFERRAL TO PHARMACIST     Referral to the pharmacist: Not needed      [facility]     Shipping address confirmed in Epic.     Delivery Scheduled: Yes, Expected medication delivery date: 07/27/21.     Medication will be delivered via UPS to the prescription address in Epic [place].    [name] M [name]   [facility] Specialty Technician

## 2021-07-26 MED FILL — BRAFTOVI 75 MG CAPSULE: ORAL | 30 days supply | Qty: 180 | Fill #9

## 2021-07-26 MED FILL — SULFASALAZINE 500 MG TABLET: ORAL | 30 days supply | Qty: 120 | Fill #4

## 2021-07-26 MED FILL — MEKTOVI 15 MG TABLET: ORAL | 30 days supply | Qty: 180 | Fill #9

## 2021-08-16 NOTE — Unmapped (Signed)
[facility] Clinical Assessment & Refill Coordination Note    [facility], DOB: [birth year]  Phone: [phone] (home)     All above HIPAA information was verified with patient.     Was a Nurse, learning disability used for this call? No    Specialty Medication(s):   Hematology/Oncology: [name]     Current Outpatient Medications   Medication Sig Dispense Refill   ??? acetaminophen (TYLENOL) 500 MG tablet Take 2 tablets (1,000 mg total) by mouth every eight (8) hours as needed for pain. 30 tablet 0   ??? binimetinib ([name]) 15 mg tablet Take 3 tablets (45 mg total) by mouth Two (2) times a day. 180 tablet 11   ??? blood sugar diagnostic Strp Dispense 100 blood glucose test strips, ok to sub any brand preferred by insurance/patient, use 3x/day; dispense whatever brand matches with meter. 100 strip 12   ??? blood-glucose meter kit Use as instructed; dispense 1 meter, whatever is preferred by insurance 1 each 1   ??? calcitrioL (ROCALTROL) 0.25 MCG capsule Take 1 capsule (0.25 mcg total) by mouth daily. You will need to make appointment for further refills. Please go on MyChart to schedule the visit or call [phone]. 90 capsule 0   ??? calcium carbonate 650 mg calcium (1,625 mg) tablet Take 1 tablet (650 mg of elem calcium total) by mouth Three (3) times a day with a meal.     ??? DULoxetine (CYMBALTA) 30 MG capsule Take 1 capsule (30 mg total) by mouth Two (2) times a day. 180 capsule 2   ??? encorafenib (BRAFTOVI) 75 mg capsule Take 6 capsules (450 mg total) by mouth daily. 180 capsule 11   ??? fexofenadine (ALLEGRA ALLERGY) 180 MG tablet Take 1 tablet (180 mg total) by mouth daily as needed (allergies).     ??? fluticasone propionate (FLONASE) 50 mcg/actuation nasal spray 2 sprays into each nostril two (2) times a day. 16 g 6   ??? folic acid (FOLVITE) 1 MG tablet TAKE 1 TABLET BY MOUTH EVERY DAY 90 tablet 3   ??? ibuprofen (ADVIL,MOTRIN) 200 MG tablet Take 3 tablets (600 mg total) by mouth daily as needed for pain.     ??? lancets Misc Dispense 100 lancets, ok to sub any brand preferred by insurance/patient, use 3x/day 100 each 12   ??? levothyroxine (SYNTHROID) 125 MCG tablet Take 375 mcg daily; You will need to make appointment for further refills. Please go on MyChart to schedule the visit or call [phone]. 270 tablet 0   ??? memantine (NAMENDA) 10 MG tablet Take 1 tablet (10 mg total) by mouth daily.     ??? memantine (NAMENDA) 10 MG tablet TAKE 1 TABLET BY MOUTH TWICE A DAY 180 tablet 4   ??? mirtazapine (REMERON) 7.5 MG tablet Take 1 tablet (7.5 mg total) by mouth nightly. 90 tablet 3   ??? ofloxacin (OCUFLOX) 0.3 % ophthalmic solution Instill 4 drops in left ear twice daily for 5 days 5 mL 3   ??? ondansetron (ZOFRAN) 8 MG tablet Take 1 tablet (8 mg total) by mouth Take as directed.     ??? sulfaSALAzine (AZULFIDINE) 500 mg tablet Take 2 tablets (1,000 mg total) by mouth two (2) times a day. 120 tablet 5   ??? terbinafine HCL (LAMISIL) 250 mg tablet Take 1 tablet (250 mg total) by mouth in the morning.       No current facility-administered medications for this visit.  Changes to medications: Cyan reports no changes at this time.    Allergies   Allergen Reactions   ??? Compazine [Prochlorperazine] Itching   ??? Coconut Nausea And Vomiting       Changes to allergies: No    SPECIALTY MEDICATION ADHERENCE     Braftovi 75 mg: 10 days of medicine on hand   [name] 15 mg: 10 days of medicine on hand     Medication Adherence    Patient reported X missed doses in the last month: 0  Specialty Medication: Braftovi  Patient is on additional specialty medications: Yes  Additional Specialty Medications: [name]          Specialty medication(s) dose(s) confirmed: Regimen is correct and unchanged.     Are there any concerns with adherence? No    Adherence counseling provided? Not needed    CLINICAL MANAGEMENT AND INTERVENTION      Clinical Benefit Assessment:    Do you feel the medicine is effective or helping your condition? Yes    Clinical Benefit counseling provided? Not needed    Adverse Effects Assessment:    Are you experiencing any side effects? No    Are you experiencing difficulty administering your medicine? No    Quality of Life Assessment:    Quality of Life      Oncology  1. What impact has your specialty medication had on the reduction of your daily pain or discomfort level?: None  2. On a scale of 1-10, how would you rate your ability to manage side effects associated with your specialty medication? (1=no issues, 10 = unable to take medication due to side effects): 1            How many days over the past month did your condition/medication  keep you from your normal activities? For example, brushing your teeth or getting up in the morning. 0    Have you discussed this with your provider? Not needed    Acute Infection Status:    Acute infections noted within Epic:  No active infections  Patient reported infection: None    Therapy Appropriateness:    Is therapy appropriate and patient progressing towards therapeutic goals? Yes, therapy is appropriate and should be continued    DISEASE/MEDICATION-SPECIFIC INFORMATION      N/A    PATIENT SPECIFIC NEEDS     - Does the patient have any physical, cognitive, or cultural barriers? No    - Is the patient high risk? Yes, patient is taking oral chemotherapy. Appropriateness of therapy as been assessed    - Does the patient require a Care Management Plan? No     SOCIAL DETERMINANTS OF HEALTH     At the [facility], we have learned that life circumstances - like trouble affording food, housing, utilities, or transportation can affect the health of many of our patients.   That is why we wanted to ask: are you currently experiencing any life circumstances that are negatively impacting your health and/or quality of life? No    Social Determinants of Psychologist, prison and probation services Strain: Not on file   Internet Connectivity: Not on file   Food Insecurity: Not on file Tobacco Use: Low Risk    ??? Smoking Tobacco Use: Never   ??? Smokeless Tobacco Use: Never   ??? Passive Exposure: Not on file   Housing/Utilities: Not on file   Alcohol Use: Not on file   Transportation Needs: Not on file   Substance  Use: Not on file   Health Literacy: Not on file   Physical Activity: Not on file   Interpersonal Safety: Not on file   Stress: Not on file   Intimate Partner Violence: Not on file   Depression: Not on file   Social Connections: Not on file       Would you be willing to receive help with any of the needs that you have identified today? No       SHIPPING     Specialty Medication(s) to be Shipped:   Hematology/Oncology: [name] and [name]    Other medication(s) to be shipped: sulfasalazine     Changes to insurance: No    Delivery Scheduled: Yes, Expected medication delivery date: 08/23/21.     Medication will be delivered via UPS to the confirmed prescription address in [facility].    The patient will receive a drug information handout for each medication shipped and additional FDA Medication Guides as required.  Verified that patient has previously received a Conservation officer, historic buildings and a Surveyor, mining.    The patient or caregiver noted above participated in the development of this care plan and knows that they can request review of or adjustments to the care plan at any time.      All of the patient's questions and concerns have been addressed.    Rollen Sox   [facility] Specialty Pharmacist

## 2021-08-22 ENCOUNTER — Ambulatory Visit
Admit: 2021-08-22 | Discharge: 2021-08-23 | Payer: PRIVATE HEALTH INSURANCE | Attending: Otolaryngology | Primary: Otolaryngology

## 2021-08-22 MED ORDER — OFLOXACIN 0.3 % EYE DROPS
3 refills | 0 days | Status: CP
Start: 2021-08-22 — End: ?

## 2021-08-22 MED ORDER — OFLOXACIN 0.3 % EAR DROPS
Freq: Every day | OTIC | 0 refills | 20 days | Status: CP
Start: 2021-08-22 — End: 2021-09-05

## 2021-08-22 MED FILL — BRAFTOVI 75 MG CAPSULE: ORAL | 30 days supply | Qty: 180 | Fill #10

## 2021-08-22 MED FILL — SULFASALAZINE 500 MG TABLET: ORAL | 30 days supply | Qty: 120 | Fill #5

## 2021-08-22 MED FILL — MEKTOVI 15 MG TABLET: ORAL | 30 days supply | Qty: 180 | Fill #10

## 2021-08-22 NOTE — Unmapped (Signed)
Kenneth Fitzgerald:  [phone]    For appointments or questions about radiology appointments, call [phone]    For nursing questions call Felizardo Hoffmann RN [phone]     For surgical scheduling, call Rolm Bookbinder [phone]    If you are concerned, do not hesistate to seek medical help at your local emergency department.      For urgent problems during regular hours (Mon-Fri 8:00am-4:00pm) call [phone] to speak with our Triage Nurse.     After hours, call [phone] and ask for the Ear, Nose and Throat (ENT) doctor on call.    Main phone tree: [phone]  Office fax #: [phone]

## 2021-08-22 NOTE — Unmapped (Signed)
Kenneth Fitzgerald [facility] returns today in follow-up of recurrent left sided effusions in the setting of previous radiation. He previously underwent nasal endoscopy with Dr. Ralene Ok that was normal. Given that fluid returned after two myringotomies, he opted to proceed with left sided PE tube at his office visit on 11/15/20. The tube was noted to be extruded at the last office visit on 05/16/21. He reports that he is doing well. Feels like ear issues come intermittently, but hearing has been stable and equal between the two ears.     Past Medical History  He  has a past medical history of Cancer (CMS-HCC), Disease of thyroid gland, HL (hearing loss), Hypothyroidism, and Skin cancer.    Review of systems   Review of systems was reviewed on attached notes/patient intake forms.     Physical Examination  Ht 188 cm (6' 2)  - Wt (!) 147 kg (324 lb)  - BMI 41.60 kg/m??     General: well appearing, stated age, no distress   Head - atraumatic, normocephalic   Nose: dorsum midline, no rhinorrhea  Neck: symmetric, trachea midline  Psychiatric: alert and oriented, appropriate mood and affect   Respiratory: no audible wheezing or stridor, normal work of breathing  Neurologic - cranial nerves 2-12 grossly intact  Facial Strength - HB 1/6 bilaterally    Ears - External ear- normal, no lesions, no malformations   Otoscopy - R: normal EAC, intact TM, clear middle ear space. L: normal EAC, redundant TM, clear middle ear space.     Audiogram  The previous audiogram was reviewed.       Imaging  None.     Assessment and Plan  Kenneth Fitzgerald is a 46 y.o. male with persistent left sided serous otitis media in the setting of previous radiation as well as bilateral conductive hearing loss, left worse than right. His previous ear tube has extruded with complete healing of the TM. Given that he has not had a recurrence of the middle ear effusion, will plan on him calling me should it redevelop rather than scheduling follow-up. I will send in a refill of his ear drops to use as needed.     The patient/family voiced understanding of the plan as detailed above and is in agreement. I appreciate the opportunity to participate in his care.    I attest to the above information and documentation. However, this note has been created using voice recognition software and may have errors that were not dictated and not seen in editing.    Cheryl Flash MD  Assistant Professor   Division of Otology/Neurotology  Department of [facility], [place], [place]

## 2021-08-28 NOTE — Unmapped (Signed)
Spoke with pt about pre call for colonoscopy for tomorrow. Pt states he has not received the prep from shared services. I reviewed the prep with him and  what to buy to get prepped for tomorrows procedure. He states understanding.

## 2021-08-29 ENCOUNTER — Ambulatory Visit: Admit: 2021-08-29 | Discharge: 2021-08-29 | Payer: PRIVATE HEALTH INSURANCE

## 2021-08-29 ENCOUNTER — Encounter
Admit: 2021-08-29 | Discharge: 2021-08-29 | Payer: PRIVATE HEALTH INSURANCE | Attending: Student in an Organized Health Care Education/Training Program | Primary: Student in an Organized Health Care Education/Training Program

## 2021-08-29 MED ORDER — SULFASALAZINE 500 MG TABLET
ORAL_TABLET | Freq: Two times a day (BID) | ORAL | 11 refills | 30 days | Status: CP
Start: 2021-08-29 — End: 2022-08-24

## 2021-08-29 MED ADMIN — propofoL (DIPRIVAN) injection: INTRAVENOUS | @ 17:00:00 | Stop: 2021-08-29

## 2021-08-29 MED ADMIN — sodium chloride (NS) 0.9 % infusion: 10 mL/h | INTRAVENOUS | @ 17:00:00 | Stop: 2021-08-29

## 2021-08-29 MED ADMIN — lidocaine (XYLOCAINE) 20 mg/mL (2 %) injection: INTRAVENOUS | @ 17:00:00 | Stop: 2021-08-29

## 2021-09-05 MED ORDER — DULOXETINE 30 MG CAPSULE,DELAYED RELEASE
ORAL_CAPSULE | Freq: Two times a day (BID) | ORAL | 2 refills | 0 days
Start: 2021-09-05 — End: ?

## 2021-09-05 NOTE — Unmapped (Signed)
Please refill if appropriate.     Most Recent Clinic Visit:07/05/2021  Next Clinic Visit: 10/18/2021

## 2021-09-12 NOTE — Unmapped (Signed)
[facility] Refill Coordination Note    Specialty Medication(s) to be Shipped:   Hematology/Oncology: [name] and [name]    Other medication(s) to be shipped: No additional medications requested for fill at this time     Kenneth Fitzgerald, DOB: 04-07-1975  Phone: [phone] (home)       All above HIPAA information was verified with patient.     Was a Nurse, learning disability used for this call? No    Completed refill call assessment today to schedule patient's medication shipment from the [facility] [phone]).  All relevant notes have been reviewed.     Specialty medication(s) and dose(s) confirmed: Regimen is correct and unchanged.   Changes to medications: Konnor reports no changes at this time.  Changes to insurance: No  New side effects reported not previously addressed with a pharmacist or physician: None reported  Questions for the pharmacist: No    Confirmed patient received a Conservation officer, historic buildings and a Surveyor, mining with first shipment. The patient will receive a drug information handout for each medication shipped and additional FDA Medication Guides as required.       DISEASE/MEDICATION-SPECIFIC INFORMATION        N/A    SPECIALTY MEDICATION ADHERENCE     Medication Adherence    Patient reported X missed doses in the last month: 1  Specialty Medication: Braftovi 75 mg  Patient is on additional specialty medications: Yes  Additional Specialty Medications: [name] 15mg   Patient Reported Additional Medication X Missed Doses in the Last Month: 1  Patient is on more than two specialty medications: No  Informant: patient                       Were doses missed due to medication being on hold? No    Braftovi 75 mg: 14 days of medicine on hand   [name] 15 mg: 14 days of medicine on hand       REFERRAL TO PHARMACIST     Referral to the pharmacist: Not needed      [facility]     Shipping address confirmed in Epic.     Delivery Scheduled: Yes, Expected medication delivery date: 09/21/21.     Medication will be delivered via UPS to the prescription address in Epic [place].    [name] M [name]   [facility] Specialty Technician

## 2021-09-20 MED FILL — MEKTOVI 15 MG TABLET: ORAL | 30 days supply | Qty: 180 | Fill #11

## 2021-09-20 MED FILL — BRAFTOVI 75 MG CAPSULE: ORAL | 30 days supply | Qty: 180 | Fill #11

## 2021-09-27 DIAGNOSIS — E278 Other specified disorders of adrenal gland: Principal | ICD-10-CM

## 2021-09-27 MED ORDER — LEVOTHYROXINE 125 MCG TABLET
ORAL_TABLET | 0 refills | 0 days
Start: 2021-09-27 — End: ?

## 2021-09-27 MED ORDER — CALCITRIOL 0.25 MCG CAPSULE
ORAL_CAPSULE | Freq: Every day | ORAL | 0 refills | 90 days
Start: 2021-09-27 — End: ?

## 2021-09-28 MED ORDER — CALCITRIOL 0.25 MCG CAPSULE
ORAL_CAPSULE | Freq: Every day | ORAL | 0 refills | 30 days
Start: 2021-09-28 — End: ?

## 2021-09-28 MED ORDER — LEVOTHYROXINE 125 MCG TABLET
ORAL_TABLET | 0 refills | 0 days
Start: 2021-09-28 — End: ?

## 2021-09-28 NOTE — Unmapped (Signed)
Last appt: 09/25/20  Upcoming appt:

## 2021-09-29 MED ORDER — LEVOTHYROXINE 125 MCG TABLET
ORAL_TABLET | 0 refills | 0 days | Status: CP
Start: 2021-09-29 — End: ?

## 2021-09-29 MED ORDER — CALCITRIOL 0.25 MCG CAPSULE
ORAL_CAPSULE | Freq: Every day | ORAL | 0 refills | 30 days | Status: CP
Start: 2021-09-29 — End: ?

## 2021-10-10 ENCOUNTER — Ambulatory Visit: Admit: 2021-10-10 | Discharge: 2021-10-10 | Payer: PRIVATE HEALTH INSURANCE

## 2021-10-10 DIAGNOSIS — R739 Hyperglycemia, unspecified: Principal | ICD-10-CM

## 2021-10-10 DIAGNOSIS — E278 Other specified disorders of adrenal gland: Principal | ICD-10-CM

## 2021-10-10 DIAGNOSIS — E890 Postprocedural hypothyroidism: Principal | ICD-10-CM

## 2021-10-10 DIAGNOSIS — C439 Malignant melanoma of skin, unspecified: Principal | ICD-10-CM

## 2021-10-10 DIAGNOSIS — C73 Malignant neoplasm of thyroid gland: Principal | ICD-10-CM

## 2021-10-10 LAB — COMPREHENSIVE METABOLIC PANEL
ALBUMIN: 4 g/dL (ref 3.4–5.0)
ALKALINE PHOSPHATASE: 108 U/L (ref 46–116)
ALT (SGPT): 65 U/L — ABNORMAL HIGH (ref 10–49)
ANION GAP: 11 mmol/L (ref 5–14)
AST (SGOT): 49 U/L — ABNORMAL HIGH (ref <=34)
BILIRUBIN TOTAL: 0.3 mg/dL (ref 0.3–1.2)
BLOOD UREA NITROGEN: 15 mg/dL (ref 9–23)
BUN / CREAT RATIO: 15
CALCIUM: 7.9 mg/dL — ABNORMAL LOW (ref 8.7–10.4)
CHLORIDE: 103 mmol/L (ref 98–107)
CO2: 23.6 mmol/L (ref 20.0–31.0)
CREATININE: 1.02 mg/dL
EGFR CKD-EPI (2021) MALE: >90 mL/min/{1.73_m2} (ref >=60)
GLUCOSE RANDOM: 141 mg/dL (ref 70–179)
POTASSIUM: 3.7 mmol/L (ref 3.4–4.8)
PROTEIN TOTAL: 7.5 g/dL (ref 5.7–8.2)
SODIUM: 138 mmol/L (ref 135–145)

## 2021-10-10 LAB — CBC W/ AUTO DIFF
BASOPHILS ABSOLUTE COUNT: 0.1 10*9/L (ref 0.0–0.1)
BASOPHILS RELATIVE PERCENT: 0.7 %
EOSINOPHILS ABSOLUTE COUNT: 0.2 10*9/L (ref 0.0–0.5)
EOSINOPHILS RELATIVE PERCENT: 2.7 %
HEMATOCRIT: 39.3 % (ref 39.0–48.0)
HEMOGLOBIN: 12.7 g/dL — ABNORMAL LOW (ref 12.9–16.5)
LYMPHOCYTES ABSOLUTE COUNT: 1 10*9/L — ABNORMAL LOW (ref 1.1–3.6)
LYMPHOCYTES RELATIVE PERCENT: 12.8 %
MEAN CORPUSCULAR HEMOGLOBIN CONC: 32.3 g/dL (ref 32.0–36.0)
MEAN CORPUSCULAR HEMOGLOBIN: 25.2 pg — ABNORMAL LOW (ref 25.9–32.4)
MEAN CORPUSCULAR VOLUME: 78 fL (ref 77.6–95.7)
MEAN PLATELET VOLUME: 7.9 fL (ref 6.8–10.7)
MONOCYTES ABSOLUTE COUNT: 0.5 10*9/L (ref 0.3–0.8)
MONOCYTES RELATIVE PERCENT: 6.2 %
NEUTROPHILS ABSOLUTE COUNT: 6 10*9/L (ref 1.8–7.8)
NEUTROPHILS RELATIVE PERCENT: 77.6 %
PLATELET COUNT: 264 10*9/L (ref 150–450)
RED BLOOD CELL COUNT: 5.04 10*12/L (ref 4.26–5.60)
RED CELL DISTRIBUTION WIDTH: 17.8 % — ABNORMAL HIGH (ref 12.2–15.2)
WBC ADJUSTED: 7.8 10*9/L (ref 3.6–11.2)

## 2021-10-10 LAB — TSH: THYROID STIMULATING HORMONE: 9.045 u[IU]/mL — ABNORMAL HIGH (ref 0.550–4.780)

## 2021-10-10 LAB — T4, FREE: FREE T4: 0.76 ng/dL — ABNORMAL LOW (ref 0.89–1.76)

## 2021-10-10 MED ORDER — LEVOTHYROXINE 125 MCG TABLET
ORAL_TABLET | 3 refills | 0.00000 days | Status: CP
Start: 2021-10-10 — End: 2021-10-10

## 2021-10-10 MED ORDER — CALCITRIOL 0.25 MCG CAPSULE
ORAL_CAPSULE | Freq: Every day | ORAL | 3 refills | 90 days | Status: CP
Start: 2021-10-10 — End: ?

## 2021-10-10 NOTE — Unmapped (Signed)
Assessment/Plan:    1. T1N1b papillary thyroid cancer (classic/follicular variant), multifocal, s/p 3 surgeries and 2 doses of I-131. Last treatment was in 2015. Has indeterminate response to treatment based on persistent detectable Tg (< 1 on suppression, rose to 2.4 with stimulation in Jan 2018).  Note that he also has stage IV melanoma, followed by oncology.  -Thyrogen-stimulated WBS was negative in 01/2016; stimulated Tg was 2.4  -Tg on suppression has been stable, last check 08/2019, 0.3 at that time with negative antibody. We ordered labs in 2022 but they were not drawn. Repeat Tg/Ab today, noting it may be a semi-stimulated level (ie TSH may be high) since he was off thyroid medication for about 8 days in the middle of this month, and has been back on for about 10 days  -12/2020 neck US stable. Repeat US in 12/2021, order placed    2. Hypothyroidism, with wide TSH fluctuations in TSH on similar doses of thyroid hormone, perhaps related to wt changes, absorption issues, and/or variable adherence (in past).  TSH was normal 12/2020 and 03/2021 while taking levothyroxine 375 mcg daily.  The last dose adjustment was 09/2021.  - Checking labs today, but will hold on making further adjustment if TSH is modestly elevated since he missed about 8 days of medication in the middle of this month  - Following both TSH and FT4 re: brain XRT x 10 days from 12/2019-01/2020  -Reviewed appropriate administration, and factors that can affect dose needs or laboratory measurement    3. Hypocalcemia, severe, started calcitriol 05/2015. Low calcium in 12/2017 due to low calcium and in 06/2018 off of calcitriol.  More recently, levels have been at or close to goal when he is taking calcitriol regularly and getting enough dietary or supplemental calcium.  -Calcium normal on recent lab check, no symptoms at present  -Continue calcitriol 0.25 mcg/day, supplemental calcium ~ 1000 mg/day, and a few servings of calcium in the diet each day  -Reviewed goals of treatment and symptoms or signs with which to take extra calcium and/or notify us.     Return for 6-7 months.     Orders Placed This Encounter   Procedures    US Thyroid    TSH    T4, Free    Thyroglobulin Tumor Marker, Serum             Reason For Visit:   Chief Complaint   Patient presents with    Follow-up thyroid cancer and hypoparathyroidism      Subjective:     History of Present Illness:  Kenneth Fitzgerald is a 46 y.o. male with PTC who was last seen 04/2020.    10/02/20 - video visit, taking LT4 250 x 1 and 375 x 6, increase LT4 to 375 mcg/day (sl increase), check TSH/Tg/Ab in 2 days and check TFTs again in 2-3 mo, neck US before end of the year, RTC 6 mo  01/03/2021-US stable  01/03/2021 -TSH 3.926, free T4 1.0, calcium 8.4, albumin 3.9, continue same doses  06/08/21 - RF calcitriol, again asked to sched appt (pt read [facility] msg)  03/2021 - TFTs nL (not sent to me, I noted incidentally 05/2021 when RF requested)  05/2021 - sent RF, requested FU appt in 08/2021  09/29/21 - sent RF x 2, has appt 9/27    Taking LT4 375 mcg daily. **Missed 8 days earlier this month due to running out of medication refills.  Has been taking regularly again for the past 10 days  or so.  Otherwise he reports good adherence.  He takes in the morning on an empty stomach without interfering medications or supplements. No overt hypothyroid or hyperthyroid symptoms. No neck pain, swelling, globus, dysphagia, or dysphonia.    Taking calcitriol 0.25 mcg/day. Taking supplemental calcium 1000 mg/day.  Dietary calcium - 1-2 servings. Has vit D in his calcium supplements. No kidney stones. No recent hypocalcemia symptoms but does keep extra calcium with him to take if needed.    Weight has been stable.  He is frustrated that weight has not improved despite reducing/eliminating caloric beverages for several months.     Weight trend:  Wt Readings from Last 6 Encounters:   10/10/21 (!) 147.2 kg (324 lb 9.6 oz)   08/29/21 (!) 147.4 kg (325 lb)   08/22/21 (!) 147 kg (324 lb)   07/05/21 (!) 147.1 kg (324 lb 4.8 oz)   04/04/21 (!) 146.6 kg (323 lb 3.2 oz)   04/04/21 (!) 147.4 kg (325 lb)       PMH:  1. 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 Thyroidectomy 06/25/2011 with 1 foci each lobe and 2/2 central LN (left Level 6)     1b. Thyrogen-based RAI 125 mCi 03/2012 with pre/post scans showing fairly large focus of bed activity and 4.2% uptake; pretx stimulated Tg of 73     1c. Recurrence on [place] 09/2012 in left thyroid bed and confirmed by FNA 10/2012; left ND in 11/2012 levels 3-6 with 12/16 positive (largest 1.8 cm, and some with extranodal extension)     1d. Right CND 03/2013 levels 2-4 with 3/20 positive (largest 3.5 mm), found incidentally as the right ND was performed for melanoma recurrence which was 1 cm node in 1 of 20 nodes     1e. Thyrogen-based 150 mCi in 05/2013 [LID as well before the treatment]      73f. Thyrogen-stimulated Tg was 2.4 in 01/2016. WBS in 01/2016 was negative.  2. Postsurgical hypothyroidism  3. Melanoma - scalp lesion, surgery including RND, and systemic therapy ending 01/2012; recurrence in a 1 cm right LN removed 03/2013 on repeat RND; XRT completed 06/2013; whole brain XRT for brain med 12/2019-01/2020  4. Diverticulitis  5. H/o anklyosing spondylitis 2009  6.  Small bowel obstruction, hospitalization 11/2018, due to adhesions from past abdominal surgeries including hernia repairs  7. Hyperglycemia, new in 03/2018, in the setting of 4 weeks of prednisone to treat a reaction to immunotherapy.  A1c 6.0% in 06/2018. Took MTF short time. A1c improved in 08/2019. Has been off MTF since 12/2019.  -A1c normal 04/2020. Glucose values on recent panels have been normal.   8. Crohns, diagnosed 09/2019 by Dr. Stevphen Rochester in GI, but likely present since 2019 per note, defer immunosuppressive therapy re: active melanoma, start w sulfasalazine     PSH reviewed in Epic      Current Outpatient Medications:     acetaminophen (TYLENOL) 500 MG tablet, Take 2 tablets (1,000 mg total) by mouth every eight (8) hours as needed for pain., Disp: 30 tablet, Rfl: 0    binimetinib ([name]) 15 mg tablet, Take 3 tablets (45 mg total) by mouth Two (2) times a day., Disp: 180 tablet, Rfl: 11    calcium carbonate 650 mg calcium (1,625 mg) tablet, Take 1 tablet (650 mg of elem calcium total) by mouth Three (3) times a day with a meal., Disp: , Rfl:     DULoxetine (CYMBALTA) 30 MG capsule, Take 1 capsule (  30 mg total) by mouth Two (2) times a day., Disp: 180 capsule, Rfl: 2    encorafenib (BRAFTOVI) 75 mg capsule, Take 6 capsules (450 mg total) by mouth daily., Disp: 180 capsule, Rfl: 11    fexofenadine (ALLEGRA ALLERGY) 180 MG tablet, Take 1 tablet (180 mg total) by mouth daily as needed (allergies)., Disp: , Rfl:     folic acid (FOLVITE) 1 MG tablet, TAKE 1 TABLET BY MOUTH EVERY DAY, Disp: 90 tablet, Rfl: 3    ibuprofen (ADVIL,MOTRIN) 200 MG tablet, Take 3 tablets (600 mg total) by mouth daily as needed for pain., Disp: , Rfl:     memantine (NAMENDA) 10 MG tablet, TAKE 1 TABLET BY MOUTH TWICE A DAY, Disp: 180 tablet, Rfl: 4    mirtazapine (REMERON) 7.5 MG tablet, Take 1 tablet (7.5 mg total) by mouth nightly., Disp: 90 tablet, Rfl: 3    ofloxacin (OCUFLOX) 0.3 % ophthalmic solution, Instill 4 drops in left ear twice daily for 5 days, Disp: 5 mL, Rfl: 3    ondansetron (ZOFRAN) 8 MG tablet, Take 1 tablet (8 mg total) by mouth Take as directed., Disp: , Rfl:     sulfaSALAzine (AZULFIDINE) 500 mg tablet, Take 4 tablets (2,000 mg total) by mouth two (2) times a day., Disp: 240 tablet, Rfl: 11    blood sugar diagnostic Strp, Dispense 100 blood glucose test strips, ok to sub any brand preferred by insurance/patient, use 3x/day; dispense whatever brand matches with meter., Disp: 100 strip, Rfl: 12    blood-glucose meter kit, Use as instructed; dispense 1 meter, whatever is preferred by insurance, Disp: 1 each, Rfl: 1    calcitriol (ROCALTROL) 0.25 MCG capsule, Take 1 capsule (0.25 mcg total) by mouth daily., Disp: 90 capsule, Rfl: 3    fluticasone propionate (FLONASE) 50 mcg/actuation nasal spray, 2 sprays into each nostril two (2) times a day., Disp: 16 g, Rfl: 6    lancets Misc, Dispense 100 lancets, ok to sub any brand preferred by insurance/patient, use 3x/day, Disp: 100 each, Rfl: 12    levothyroxine (SYNTHROID) 125 MCG tablet, Take 375 mcg daily; please dispense 90-day supply, Disp: 270 tablet, Rfl: 3      Allergies   Allergen Reactions    Compazine [Prochlorperazine] Itching    Coconut Nausea And Vomiting       ROS  No chest pain or SOB. No f/c.  No cough.  No fractures or falls. Remainder of 8 systems reviewed were negative except as noted in the history of present illness.    Social Hx:  -Living in [place].  -Working full-time at [employer], a Quarry manager company, changed to desk job in 07-16-20  -Nonsmoker    Family History   Problem Relation Age of Onset    Hyperthyroidism Mother     Osteoporosis Mother     Arrhythmia Mother     Squamous cell carcinoma Mother         basal cell vs squamous cell skin cancer    Coronary artery disease Father         s/p CABG    Diabetes Father     Hypertension Father     Prostate cancer Father     Colon cancer Paternal Grandmother     Thyroid disease Neg Hx    Father died 07-16-17 with diabetes and heart disease  Mother died in 07-16-12    Objective:     Physical Exam:  BP 155/98 (BP Site: L Arm, BP Position:  Sitting, BP Cuff Size: Large)  - Pulse 96  - Wt (!) 147.2 kg (324 lb 9.6 oz)  - BMI 41.68 kg/m??    BP Readings from Last 3 Encounters:   10/10/21 155/98   08/29/21 120/86   07/05/21 137/88     General appearance - Pleasant, conversant, NAD. Generalized and abdominal obesity.   Eyes -  No stare or lid lag, no lid edema  Neck - Supple w/o palpable LAD, no masses appreciated. Well healed incision from thyroidectomy.and previous LND.   Lymphatics - no cervical or supraclavicular adenopathy appreciated   Resp - clear to auscultation bilaterally  CV - normal rate, regular rhythm  Neurological - no hand tremors, 2+ upper extremity DTRs  Extremities - peripheral pulses normal, no lower extremity edema  Skin - warm, dry, no acanthosis or rashes noted    Data Review:    TSH   Date Value   04/04/2021 1.157 uIU/mL   01/03/2021 3.926 uIU/mL   09/13/2020 7.389 uIU/mL (H)   10/04/2013 0.11 u[iU]/mL (L)   06/28/2013 0.28 u[iU]/mL (L)   04/27/2013 0.09 u[iU]/mL (L)     Free T4 (ng/dL)   Date Value   16/10/9602 1.01   01/03/2021 1.00   09/13/2020 0.79 (L)   10/04/2013 1.55 (H)   06/28/2013 1.40   04/27/2013 1.69 (H)     THYROGLOBULIN AB (IU/mL)   Date Value   08/30/2019 <1.8   06/22/2018 <1.8   12/17/2017 <1.8     Thyroglobulin, Tumor Marker, IA (ng/mL)   Date Value   08/30/2019 0.3 (H)   06/22/2018 0.2 (H)   12/17/2017 0.3 (H)      Lab Results   Component Value Date    CALCIUM 8.5 (L) 07/05/2021    CALCIUM 9.4 04/04/2021    CALCIUM 8.4 (L) 01/03/2021    PHOS 5.8 (H) 05/10/2020    PHOS 4.9 02/01/2020    PHOS 5.2 (H) 01/31/2020    CREATININE 1.09 07/05/2021    CREATININE 0.98 04/04/2021    CREATININE 1.05 01/03/2021    VITDTOTAL 31.0 02/27/2017    VITDTOTAL 37 12/04/2015    VITDTOTAL 34 12/12/2014    PTH 9.9 (L) 12/04/2015    PTH 12.4 12/12/2014    PTH 12 04/27/2013     Lab Results   Component Value Date    A1C 5.4 05/10/2020     Calcium values while hospitalized in 06/2016 were low at less than 7, and had low ionized calcium as well.     01/2016 Thyrogen stimulated testing  - Tg rose to 2.4    PATH from 03/2013 right neck dissection:  Amended: 03/31/2013 by Kirkland Hun, MD  Reason: Additional Information  Comment: The report is amended to add the results of a BRAF V600E immunostain.  The final diagnosis is unchanged.  Previous Signout Date: 03/30/2013  Diagnosis:  Lymph nodes, right cervical, levels 2-4, removal and partial parotidectomy  -Metastatic melanoma involving 1 out of 20 lymph nodes, with the largest diameter measuring 10 mm (1.0 cm) and no evidence of extracapsular extension (1/20)  -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, with the largest diameter measuring 3.5 mm and no evidence of extracapsular extension (3/20)   -BRAF V600E immunohistochemical staining is positive (3+ staining, 100% of cells) --> this was on LN with melanoma  -Benign parotid gland is also present  *Molecular testing:  RESULTS:  Gene Variants of Known Clinical Utility:   BRAF: No mutation detected (see note) -->  A BRAF V600E immunohistochemical stain was performed on the tissue section of this case and was positive (see case [id number]). Though within validated parameters, the tumor input for sequencing in this case was low. This could result in the V600E mutant allele frequency falling below the limit of detection for the assay. [Again on LN with melanoma]  KIT: No mutation detected     Radiology:      Results for orders placed during the hospital encounter of 10/28/18   US Soft Tissue Head And Neck    Narrative EXAM: US SOFT TISSUE HEAD AND NECK  DATE: 10/28/2018 9:20 AM  ACCESSION: [id number]  DICTATED: 10/28/2018 10:48 AM  INTERPRETATION LOCATION: [facility]    CLINICAL INDICATION: 46 years old Male with thyroid cancer  - C73 - Thyroid cancer (CMS - HCC)     COMPARISON: Thyroid ultrasound from 12/17/2017 and earlier examinations    TECHNIQUE:  Ultrasound views of the thyroid were obtained using gray scale and limited color Doppler imaging.    FINDINGS:  Status post total thyroidectomy. Redemonstration of heterogeneous hypoechoic nodule in the midline, superior to the thyroidectomy bed measuring 1.1 x 1 x 0.5 cm, previously 1.1 x 1.0 x 0.6 cm. No new soft tissues are noted in the thyroidectomy bed.    Lymph nodes: No adenopathy      Impression Status post total thyroidectomy. Unchanged 1.1 cm heterogeneous nodule in the midline superior to the thyroidectomy bed. Appearance is similar since 02/02/2015, likely nonaggressive process. Subcentimeter cervical chain lymph nodes measuring up to 0.7 cm and left level 2, previously 0.4 cm.    Please see below for data measurements:    Lymph node measurements:  Right I 0.48 cm  Right II 0.18 cm  Right V 0.14 cm    Left I 0.49 cm  Left II 0.68 cm  Left III 0.36 cm  Left IV 0.48 cm  Left V 0.29 cm   [Images reviewed 10/2018]    01/2016 Whole body scan:  FINDINGS:  There are no abnormal foci of increased radiotracer uptake identified.  There is physiologic uptake seen in the nasopharynx, the bowel, stomach, and bladder.  Impression  No evidence of I-131 avid metastasis.    05/2013 had stimulated PET and WBS, as well as 1 week post treatment WBS     Other Medical Data:

## 2021-10-11 DIAGNOSIS — C439 Malignant melanoma of skin, unspecified: Principal | ICD-10-CM

## 2021-10-11 MED ORDER — BRAFTOVI 75 MG CAPSULE
ORAL_CAPSULE | Freq: Every day | ORAL | 11 refills | 30.00000 days | Status: CN
Start: 2021-10-11 — End: ?

## 2021-10-11 MED ORDER — MEKTOVI 15 MG TABLET
ORAL_TABLET | Freq: Two times a day (BID) | ORAL | 11 refills | 30.00000 days | Status: CN
Start: 2021-10-11 — End: ?

## 2021-10-11 NOTE — Unmapped (Signed)
[facility] Refill Coordination Note    Specialty Medication(s) to be Shipped:   Hematology/Oncology: Kenneth Fitzgerald and [name]    Other medication(s) to be shipped: No additional medications requested for fill at this time     Kenneth Fitzgerald, DOB: 09/22/1975  Phone: [phone] (home)       All above HIPAA information was verified with patient.     Was a Nurse, learning disability used for this call? No    Completed refill call assessment today to schedule patient's medication shipment from the [facility] [phone]).  All relevant notes have been reviewed.     Specialty medication(s) and dose(s) confirmed: Regimen is correct and unchanged.   Changes to medications: Kenneth Fitzgerald reports no changes at this time.  Changes to insurance: No  New side effects reported not previously addressed with a pharmacist or physician: None reported  Questions for the pharmacist: No    Confirmed patient received a Conservation officer, historic buildings and a Surveyor, mining with first shipment. The patient will receive a drug information handout for each medication shipped and additional FDA Medication Guides as required.       DISEASE/MEDICATION-SPECIFIC INFORMATION        N/A    SPECIALTY MEDICATION ADHERENCE     Medication Adherence    Patient reported X missed doses in the last month: 0  Specialty Medication: Braftovi 75 mg  Patient is on additional specialty medications: Yes  Additional Specialty Medications: [name] 15mg   Patient Reported Additional Medication X Missed Doses in the Last Month: 0  Patient is on more than two specialty medications: No  Informant: patient                       Were doses missed due to medication being on hold? No    Braftovi 75 mg: 14 days of medicine on hand   [name] 15 mg: 14 days of medicine on hand       REFERRAL TO PHARMACIST     Referral to the pharmacist: Not needed      [facility]     Shipping address confirmed in Epic.     Delivery Scheduled: Yes, Expected medication delivery date: 10/23/21.  However, Rx request for refills was sent to the provider as there are none remaining.     Medication will be delivered via UPS to the prescription address in Epic [place].    [name] M [name]   [facility] Specialty Technician

## 2021-10-18 ENCOUNTER — Ambulatory Visit: Admit: 2021-10-18 | Discharge: 2021-10-18 | Payer: PRIVATE HEALTH INSURANCE

## 2021-10-18 ENCOUNTER — Ambulatory Visit
Admit: 2021-10-18 | Discharge: 2021-10-18 | Payer: PRIVATE HEALTH INSURANCE | Attending: Hematology & Oncology | Primary: Hematology & Oncology

## 2021-10-18 DIAGNOSIS — J189 Pneumonia, unspecified organism: Principal | ICD-10-CM

## 2021-10-18 DIAGNOSIS — C439 Malignant melanoma of skin, unspecified: Principal | ICD-10-CM

## 2021-10-18 DIAGNOSIS — E039 Hypothyroidism, unspecified: Principal | ICD-10-CM

## 2021-10-18 DIAGNOSIS — Z0000 Encounter for general adult medical examination without abnormal findings: Principal | ICD-10-CM

## 2021-10-18 LAB — CBC W/ AUTO DIFF
BASOPHILS ABSOLUTE COUNT: 0.1 10*9/L (ref 0.0–0.1)
BASOPHILS RELATIVE PERCENT: 1.3 %
EOSINOPHILS ABSOLUTE COUNT: 0.3 10*9/L (ref 0.0–0.5)
EOSINOPHILS RELATIVE PERCENT: 3.9 %
HEMATOCRIT: 36.6 % — ABNORMAL LOW (ref 39.0–48.0)
HEMOGLOBIN: 12 g/dL — ABNORMAL LOW (ref 12.9–16.5)
LYMPHOCYTES ABSOLUTE COUNT: 1.2 10*9/L (ref 1.1–3.6)
LYMPHOCYTES RELATIVE PERCENT: 17.5 %
MEAN CORPUSCULAR HEMOGLOBIN CONC: 32.7 g/dL (ref 32.0–36.0)
MEAN CORPUSCULAR HEMOGLOBIN: 25.3 pg — ABNORMAL LOW (ref 25.9–32.4)
MEAN CORPUSCULAR VOLUME: 77.4 fL — ABNORMAL LOW (ref 77.6–95.7)
MEAN PLATELET VOLUME: 8.2 fL (ref 6.8–10.7)
MONOCYTES ABSOLUTE COUNT: 0.4 10*9/L (ref 0.3–0.8)
MONOCYTES RELATIVE PERCENT: 5.1 %
NEUTROPHILS ABSOLUTE COUNT: 5.1 10*9/L (ref 1.8–7.8)
NEUTROPHILS RELATIVE PERCENT: 72.2 %
PLATELET COUNT: 252 10*9/L (ref 150–450)
RED BLOOD CELL COUNT: 4.73 10*12/L (ref 4.26–5.60)
RED CELL DISTRIBUTION WIDTH: 19.3 % — ABNORMAL HIGH (ref 12.2–15.2)
WBC ADJUSTED: 7.1 10*9/L (ref 3.6–11.2)

## 2021-10-18 LAB — COMPREHENSIVE METABOLIC PANEL
ALBUMIN: 3.8 g/dL (ref 3.4–5.0)
ALKALINE PHOSPHATASE: 84 U/L (ref 46–116)
ALT (SGPT): 41 U/L (ref 10–49)
ANION GAP: 2 mmol/L — ABNORMAL LOW (ref 5–14)
AST (SGOT): 42 U/L — ABNORMAL HIGH (ref <=34)
BILIRUBIN TOTAL: 0.2 mg/dL — ABNORMAL LOW (ref 0.3–1.2)
BLOOD UREA NITROGEN: 20 mg/dL (ref 9–23)
BUN / CREAT RATIO: 19
CALCIUM: 7.9 mg/dL — ABNORMAL LOW (ref 8.7–10.4)
CHLORIDE: 110 mmol/L — ABNORMAL HIGH (ref 98–107)
CO2: 26 mmol/L (ref 20.0–31.0)
CREATININE: 1.07 mg/dL
EGFR CKD-EPI (2021) MALE: 87 mL/min/{1.73_m2} (ref >=60)
GLUCOSE RANDOM: 105 mg/dL (ref 70–179)
POTASSIUM: 3.8 mmol/L (ref 3.4–4.8)
PROTEIN TOTAL: 7 g/dL (ref 5.7–8.2)
SODIUM: 138 mmol/L (ref 135–145)

## 2021-10-18 LAB — MAGNESIUM: MAGNESIUM: 1.7 mg/dL (ref 1.6–2.6)

## 2021-10-18 LAB — SLIDE REVIEW

## 2021-10-18 LAB — TSH: THYROID STIMULATING HORMONE: 5.59 u[IU]/mL — ABNORMAL HIGH (ref 0.550–4.780)

## 2021-10-18 LAB — T4, FREE: FREE T4: 1.02 ng/dL (ref 0.89–1.76)

## 2021-10-18 MED ORDER — AMOXICILLIN 875 MG-POTASSIUM CLAVULANATE 125 MG TABLET
ORAL_TABLET | Freq: Two times a day (BID) | ORAL | 0 refills | 7 days | Status: CP
Start: 2021-10-18 — End: 2021-10-25

## 2021-10-18 MED ADMIN — gadobenate dimeglumine (MULTIHANCE) 529 mg/mL (0.1mmol/0.2mL) solution 20 mL: 20 mL | INTRAVENOUS | @ 15:00:00 | Stop: 2021-10-18

## 2021-10-18 NOTE — Unmapped (Signed)
PRIMARY CARE PHYSICIAN  Reino Bellis R, DO  [address]  [place] [address]    CONSULTING PHYSICIANS  Lendell Caprice [place], [place]  [address]  [id number] [address]  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   -S/p thyroidectomy 06/2011 and radioactive iodine 03/2012 and again 05/2013.    -Thyroid CA recurrence 11/2012 s/p L neck dissecion 12/07/2012.  -2nd thyroid CA recurrence 03/2013 AND co-existing 1st melanoma recurrence 03/2013 s/p R -cervical level 2-4 node dissection and partial parotidectomy  -S/p radiation to R neck, completed 06/2013  -Nivolumab (02/12/18 x 2 cycles) complicated by colitis that required a 6 week steroid taper.   -s/p WBRT, completed 01/20/20    CURRENT THERAPY:   BRAF/MEK started early May 2020    ASSESSMENT: [name] is a pleasant 46 y.o. male who presents today for further evaluation of recurrent stage IV melanoma. 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    PLAN  1. Stage IV melanoma: BRAF+ by IHC. Dr Eyvonne Left reviewed path: TILS present, non-brisk; lymphocytes are 20 to 30% the number of melanoma cells.  Known sites of disease include abdominal/pelvic LN, and brain. Started dabrafenib/trametinib mid-May 2020, has had stable disease extracranially.      - Reviewed path from hernia surgery in Jan 2022, noted that the abdominal wall/peritoneal implants contained melanophages, but no melanoma cells.  We discussed that this is interesting, good to know that there wasn't melanoma in the biopsy, is in keeping w/immune response.    --Personally reviewed CT chest, MRI brain, abdomen today. There is NED in chest/abdomen.  Brain MRI pending.  - He is back on BRAF/MEK targeted therapy.  Will continue.   - He is s/p WBRT, completed 01/20/20. MRI brain from today personally reviewed and c/w stable disease.  Rad/onc is following.  -clinically, he is doing well.  -RTC in 12 weeks for clinical follow up and repeat imaging.  -Of note, we have attempted to confirm BRAF on NGS twice, on original primary specimen from July 2012 and and pelvic LN sample from 01/09/2018.  Both samples were insufficient tissue for analysis.  At this point, BRAF is known by IHC only.     #.) Brain mets; s/p WBRT as per above.  Brain MRI from today pending  Asx at this time.  Rad/onc following.    #.) RLL PNA; he is symptomatic w/cough;  -Augmentin rx'ed for aspiration PNA  -call if symptoms progress/worsen    #.) health maintenance/CAD on CT chest  --BP typically well controlled, running high today in clinic 2/2 small cuff  --advised PCP, he doesn't have one so placed [facility] referral for wellness check, cholesterol, etc.    #) L ear fullness, hearing loss; ENT following.  Is better s/p PE tube placement, since fallen out.     #.) LBP, radiculopathy, MRI abdomen 01/03/21 findings w/disc bulge   - imaging shows improvement in the L sided low back findings and pt reports symptom improvement.    #.) Atypical Crohn's; recent colonoscopy 08/29/21 w/severe colitis, moderate proctitis.   -- Cont increased sulfasalazine per GI medicine.    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 During hospital stay, it was determined that this was malignant thrombus and no AC recommended.  Will follow.     #.)  Hernia repair; all scars healed and he is eating and drinking normally.  Surgeon aware abdominal wall finding, CTM.     #.) Thyroid CA, recurrent. Well controlled now. Followed by endocrinology  --TSH/FT4 repeated today now the he is back on synthroid/hasn't missed doses  --will alert endocrinology    #.) Mood; excessive stress related to h/o 2 malignancies, job loss and legal issues surrounding, parents both passed away in recent years.   He follows w/ Drs. Yopp and [facility].     #.) HypoCA; he struggles w/compliance during the workday,cont Ca supplementation    #.) Supportive care/GOC;appreciate pall care support and med titration. Discussed w/[name]; OK to pause pall care for now given well controlled disease and well controlled symptoms.      INTERVAL HISTORY: This is a return visit to the [facility] for further evaluation of melanoma.  Injured shoulder x 2, tripped at work.   Being treated for bursitis locally, but it is no better.   Started a higher dose of sulfasalazine, this has helped w/rectal bleeding.   Feels well otherwise, is working full time.    Very tired at the end of the day.  Doesn't have the energy to make healthy food.  Notes about 10 day h/o increased cough, intermittent fever.  Weight is stable.    PE tube is out, but hearing well.    HA/scalp pain much better.  No new CNS symptoms.     Completed PT for neck lymphedema, this is helping some.  Follows w/Drs. [facility], finds this helpful. Mood is good lately.       PAST MEDICAL HISTORY:   1. Diverticulitis.   2. History of ankylosing spondylitis in 2009. Please see past medical history dictated on 11/15/2010 for details. In brief, he was treated w/Enbrel and prednisone for 6mos or so.  Symptoms improved, immunosuppressants were stopped and symptoms did not recur.   3. S/p incisional hernia repair       SOCIAL HISTORY:    reports that he has never smoked. He has never used smokeless tobacco. He reports current alcohol use. He reports that he does not use drugs.      MEDICATIONS:   Current Outpatient Medications   Medication Sig Dispense Refill    acetaminophen (TYLENOL) 500 MG tablet Take 2 tablets (1,000 mg total) by mouth every eight (8) hours as needed for pain. 30 tablet 0    binimetinib ([name]) 15 mg tablet Take 3 tablets (45 mg total) by mouth Two (2) times a day. 180 tablet 11    blood sugar diagnostic Strp Dispense 100 blood glucose test strips, ok to sub any brand preferred by insurance/patient, use 3x/day; dispense whatever brand matches with meter. 100 strip 12    blood-glucose meter kit Use as instructed; dispense 1 meter, whatever is preferred by insurance 1 each 1    calcitriol (ROCALTROL) 0.25 MCG capsule Take 1 capsule (0.25 mcg total) by mouth daily. 90 capsule 3    calcium carbonate 650 mg calcium (1,625 mg) tablet Take 1 tablet (650 mg of elem calcium total) by mouth Three (3) times a day with a meal.      DULoxetine (CYMBALTA) 30 MG capsule Take 1 capsule (30 mg total) by mouth Two (2) times a day. 180 capsule 2    encorafenib (BRAFTOVI) 75 mg capsule Take 6 capsules (450 mg total) by mouth daily. 180 capsule 11    fexofenadine (ALLEGRA ALLERGY) 180 MG tablet  Take 1 tablet (180 mg total) by mouth daily as needed (allergies).      folic acid (FOLVITE) 1 MG tablet TAKE 1 TABLET BY MOUTH EVERY DAY 90 tablet 3    ibuprofen (ADVIL,MOTRIN) 200 MG tablet Take 3 tablets (600 mg total) by mouth daily as needed for pain.      lancets Misc Dispense 100 lancets, ok to sub any brand preferred by insurance/patient, use 3x/day 100 each 12    levothyroxine (SYNTHROID) 125 MCG tablet Take 375 mcg daily; please dispense 90-day supply 270 tablet 3    memantine (NAMENDA) 10 MG tablet TAKE 1 TABLET BY MOUTH TWICE A DAY 180 tablet 4    mirtazapine (REMERON) 7.5 MG tablet Take 1 tablet (7.5 mg total) by mouth nightly. 90 tablet 3    ofloxacin (OCUFLOX) 0.3 % ophthalmic solution Instill 4 drops in left ear twice daily for 5 days 5 mL 3    ondansetron (ZOFRAN) 8 MG tablet Take 1 tablet (8 mg total) by mouth Take as directed.      sulfaSALAzine (AZULFIDINE) 500 mg tablet Take 4 tablets (2,000 mg total) by mouth two (2) times a day. 240 tablet 11    amoxicillin-clavulanate (AUGMENTIN) 875-125 mg per tablet Take 1 tablet by mouth Two (2) times a day for 7 days. 14 tablet 0    fluticasone propionate (FLONASE) 50 mcg/actuation nasal spray 2 sprays into each nostril two (2) times a day. 16 g 6     No current facility-administered medications for this visit.       REVIEW OF SYSTEMS  A complete review of systems was obtained and was negative except for those symptoms listed in the HPI     PHYSICAL EXAM  Vitals:    10/18/21 1430   BP: 161/97   Pulse: 76   Resp: 16   Temp: 35.9 ??C (96.7 ??F)   SpO2: 98%           GEN: Awake and alert, pleasant appearing male in no acute distress  LUNGS: normal WOB, CTAB  CV: NAD, S1S2  ABD; obese, soft NTND  SKIN: No rashes, petechiae or jaundice noted.  All melanoma scarlines examined; no nodularity.    PYSCH: Alert and oriented to person, place and time  EXT: No edema noted of the lower extremity noted.          LABS  Lab Results   Component Value Date    WBC 7.1 10/18/2021    HGB 12.0 (L) 10/18/2021    HCT 36.6 (L) 10/18/2021    PLT 252 10/18/2021       Lab Results   Component Value Date    NA 138 10/18/2021    K 3.8 10/18/2021    CL 110 (H) 10/18/2021    CO2 26.0 10/18/2021    BUN 20 10/18/2021    CREATININE 1.07 10/18/2021    GLU 105 10/18/2021    CALCIUM 7.9 (L) 10/18/2021    MG 1.7 10/18/2021    PHOS 5.8 (H) 05/10/2020       Lab Results   Component Value Date    BILITOT 0.2 (L) 10/18/2021    BILIDIR 0.20 01/27/2020    PROT 7.0 10/18/2021    ALBUMIN 3.8 10/18/2021    ALT 41 10/18/2021    AST 42 (H) 10/18/2021    ALKPHOS 84 10/18/2021    GGT 42 08/22/2011       Lab Results   Component Value Date    INR 1.12 01/24/2020  RADIOLOGY RESULTS;  CT chest 10/18/21:  Impression      1.No thoracic metastatic disease.   2.Posterior basilar right lower lobe segmental consolidation compatible with aspiration. Recommend follow-up in 4 to 6 weeks to confirm resolution.   3.Advanced for age atherosclerotic calcifications of the coronary arteries.       MRI abdomen 10/18/21:  Impression   Compared to prior examinations dating back to 06/21/2020, stable size of soft tissue implants along the hepatic capsule of segment 5. No new sites of metastatic disease identified within the abdomen.      Persistent mild splenomegaly.            Brain MRI 07/05/21:  Impression   Unchanged innumerable micrometastases throughout the supratentorial and infratentorial brain.       Colonoscopy 08/29/21:  Impression:            - Patent functional end-to-end colo-colonic                          anastomosis, characterized by healthy appearing mucosa.                         - Friability and scarring with contact bleeding at the                          ileocecal valve. Biopsied.                         - Pseudopolyps in the transverse colon. Biopsied.                         - Diverticulosis in the entire examined colon.                         - Erythematous, granular, edematous mucosa in the                          rectum. This is worse compared wtih prior and appears                          consistent with mildlly active Crohn's colitis.                          Biopsied to rule out melanoma.    08/29/21:  Diagnosis   A: Colon, biopsy:  -Severely active chronic colitis with erosion.     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       Echo 04/29/19:  Summary    1. The left ventricle is normal in size with normal wall thickness.    2. The left ventricular systolic function is normal, LVEF is visually  estimated at 55%.    3. The right ventricle is upper normal in size, with normal systolic  function.          PATHOLOGY:  Pelvic LN 01/09/18:  Final Diagnosis   A: Colon, transverse, biopsy  - Moderately-severely active chronic colitis  - No CMV viral cytopathic effect, granuloma, or dysplasia identified  - No metastatic melanoma or papillary thyroid cancer identified  - See comment     B: Lymph node, pelvic, core needle biopsy  - Metastatic melanoma  -  See comment        FNA R femur 07/19/14:  : Bone, right femur, core biopsy with touch imprints  - No primary or metastatic malignancy identified  - Bone with focal osteocyte dropout, suggestive of necrosis  - Bone marrow with trilineage hematopoiesis and focal effacement by chronic inflammation (see comment)    B: Bone, right femur, fine needle aspiration  - Few atypical cells present on direct smears (see comment)  - Scant fragments of edematous stromal tissue in cell block  - Bone marrow with trilineage hematopoiesis    Diagnosis:  Lymph nodes, right cervical, levels 2-4, removal and partial parotidectomy  -Metastatic melanoma involving 1 out of 20 lymph nodes, with the largest  diameter measuring 10 mm (1.0 cm) and no evidence of extracapsular extension  (1/20)  -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

## 2021-10-22 DIAGNOSIS — C439 Malignant melanoma of skin, unspecified: Principal | ICD-10-CM

## 2021-10-22 NOTE — Unmapped (Signed)
Gurvir Thane [facility] 's Braftovi and [facility]  shipment will be delayed as a result of no refills remain on the prescription.      I have reached out to the patient  at [phone] and communicated the delay. We will call the patient back to reschedule the delivery upon resolution. We have not confirmed the new delivery date.

## 2021-10-23 ENCOUNTER — Ambulatory Visit
Admit: 2021-10-23 | Discharge: 2021-10-24 | Payer: PRIVATE HEALTH INSURANCE | Attending: Radiation Oncology | Primary: Radiation Oncology

## 2021-10-23 MED ORDER — MEMANTINE 10 MG TABLET
ORAL_TABLET | Freq: Two times a day (BID) | ORAL | 6 refills | 30 days | Status: CP
Start: 2021-10-23 — End: 2022-05-21

## 2021-10-23 NOTE — Unmapped (Signed)
Radiation Oncology Telemedicine Follow Up Visit    Encounter Date: 10/23/2021  Patient Name: Kenneth Fitzgerald [facility]  Medical Record Number: [id number]  Patient identity verbally confirmed: Yes.  Verbal consent for telemedicine encounter: Yes.  Location of patient:  [address]  [address] [place] [address]   Location of provider [place], [place]): [place], [place]  Encounter medium: audio video  Software platform: EPIC  Attendees (and relation to patient): Patient, Sydnee Levans, PA-C  Total time: 10 minutes      IDENTIFICATION:  Kenneth Fitzgerald is a 46 y.o. man with a history of multiply recurrent thyroid cancer treated with RAI (05/2013) and post-op EBRT to R neck to 48 Gy (completed 06/2013), as well as stage III scalp melanoma diagnosed in 2012, pT2pN2, s/p WLE and 3/3 positive SLN's, completion right level 2 neck dissection on 11/06/10 (0/25), one year of interferon completed 01/2012, and then right neck dissection 03/29/13 showing 1/20 nodes (1cm with no ECE).      He has had stable extracranial disease on dabrafenib/trametinib and was noted to have multiple punctate lesions (best seen on T1 noncontrast sequence) in the brain suspicious for metastasis that were increasing in number since January 2020.  He completed WBRT to 30 Gy on 01/20/20.       DURATION SINCE COMPLETION OF RADIOTHERAPY:  1 year 9 months      ASSESSMENT:   Disease Status:  Intracranial--stable, no evidence of progression (MRI brain 10/18/21); Extracranial--no clear evidence of progression, indeterminate perirectal/mesorectal lymph nodes and new sparse lung micronodules (CT chest and MRI abd/pelvis 10/18/21)  Side effects: Memory issues, sinus/ear congestion     RECOMMENDATIONS:  FOLLOW-UP: 3 months with MRI brain (already ordered by Dr. Nedra Hai)  2.   Continue memantine  3.   Continue BRAF/MEK inhibitor per Dr. Nedra Hai     INTERVAL HISTORY:    Presently feeling fairly poorly due to pneumonia, though continues to work ~45 hours/week.    Continues with congested sinuses, uses Flonase regularly    Memory issues somewhat improved, still taking memantine       REVIEW OF SYSTEMS:  A comprehensive review of 10 systems was negative except for pertinent positives noted in HPI.      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)    Malignant neoplasm of thyroid gland (CMS-HCC)    Postoperative hypothyroidism    Hypocalcemia    Thyroid cancer (CMS-HCC)    Malignant melanoma, metastatic (CMS-HCC)    Ventral hernia with bowel obstruction    Insomnia    Crohn's disease of large intestine without complication (CMS-HCC)    Malignant neoplasm of brain, unspecified location (CMS-HCC)    Gout    01/24/2020: Open, primary ventral hernia repair for SBO    Morbid obesity with BMI of 40.0-44.9, adult (CMS-HCC)       PAST MEDICAL HISTORY/FAMILY HISTORY/SOCIAL HISTORY:  Reviewed in EPIC    ALLERGIES/MEDICATIONS:  Reviewed in EPIC      OBJECTIVE:  Karnofsky/Lansky Performance Status: 90,  Able to carry on normal activity; minor signs or symptoms of disease (ECOG equivalent 0)  Vitals: Not performed/obtained  General: No acute distress, alert and oriented  Pulm: No increased work of breathing, speaking in full sentences  Neuro:  Speech clear/fluent, no expressive aphasia, comprehension full  Psych: Normal mood and affect        The patient reports they are currently: at home. I spent 6  minutes on the video visit with the patient on the date of service. I spent an additional 5 minutes on pre- and post-visit activities on the date of service.     The patient was physically located in [place] or a state in which I am permitted to provide care. The patient and/or parent/guardian understood that s/he may incur co-pays and cost sharing, and agreed to the telemedicine visit. The visit was reasonable and appropriate under the circumstances given the patient's presentation at the time.    The patient and/or parent/guardian has been advised of the potential risks and limitations of this mode of treatment (including, but not limited to, the absence of in-person examination) and has agreed to be treated using telemedicine. The patient's/patient's family's questions regarding telemedicine have been answered.     If the visit was completed in an ambulatory setting, the patient and/or parent/guardian has also been advised to contact their provider???s office for worsening conditions, and seek emergency medical treatment and/or call 911 if the patient deems either necessary.         Sydnee Levans, PA-C  Department of Radiation Oncology  [facility]  [address], [id number]  [place], [place] [address]  O: [phone]  10/23/21 4:51 PM

## 2021-10-24 DIAGNOSIS — C439 Malignant melanoma of skin, unspecified: Principal | ICD-10-CM

## 2021-10-26 DIAGNOSIS — C439 Malignant melanoma of skin, unspecified: Principal | ICD-10-CM

## 2021-10-30 DIAGNOSIS — C439 Malignant melanoma of skin, unspecified: Principal | ICD-10-CM

## 2021-10-30 DIAGNOSIS — G4700 Insomnia, unspecified: Principal | ICD-10-CM

## 2021-10-30 DIAGNOSIS — F32A Depression, unspecified depression type: Principal | ICD-10-CM

## 2021-10-30 MED ORDER — FOLIC ACID 1 MG TABLET
ORAL_TABLET | 0 refills | 0 days | Status: CP
Start: 2021-10-30 — End: ?

## 2021-10-30 MED ORDER — MIRTAZAPINE 7.5 MG TABLET
ORAL_TABLET | 3 refills | 0 days
Start: 2021-10-30 — End: ?

## 2021-10-30 MED ORDER — BINIMETINIB 15 MG TABLET
ORAL_TABLET | Freq: Two times a day (BID) | ORAL | 11 refills | 30 days | Status: CP
Start: 2021-10-30 — End: ?
  Filled 2021-11-01: qty 180, 30d supply, fill #0

## 2021-10-30 MED ORDER — ENCORAFENIB 75 MG CAPSULE
ORAL_CAPSULE | Freq: Every day | ORAL | 11 refills | 30 days | Status: CP
Start: 2021-10-30 — End: ?
  Filled 2021-11-01: qty 180, 30d supply, fill #0

## 2021-10-30 MED ORDER — LEVOTHYROXINE 125 MCG TABLET
ORAL_TABLET | 3 refills | 0 days | Status: CP
Start: 2021-10-30 — End: ?

## 2021-10-30 NOTE — Unmapped (Signed)
Pt is requesting refill    Most recent clinic visit: 10/18/2021  Next clinic visit:  01/16/2022

## 2021-10-30 NOTE — Unmapped (Signed)
Refill request for Folate authorized x3. Appointments up to date. Next appointment due 2/24.

## 2021-10-31 ENCOUNTER — Ambulatory Visit
Admit: 2021-10-31 | Discharge: 2021-11-01 | Payer: PRIVATE HEALTH INSURANCE | Attending: Student in an Organized Health Care Education/Training Program | Primary: Student in an Organized Health Care Education/Training Program

## 2021-10-31 DIAGNOSIS — Z0000 Encounter for general adult medical examination without abnormal findings: Principal | ICD-10-CM

## 2021-10-31 DIAGNOSIS — C439 Malignant melanoma of skin, unspecified: Principal | ICD-10-CM

## 2021-10-31 DIAGNOSIS — Z130 Encounter for screening for diseases of the blood and blood-forming organs and certain disorders involving the immune mechanism: Principal | ICD-10-CM

## 2021-10-31 DIAGNOSIS — M79605 Pain in left leg: Principal | ICD-10-CM

## 2021-10-31 DIAGNOSIS — C73 Malignant neoplasm of thyroid gland: Principal | ICD-10-CM

## 2021-10-31 DIAGNOSIS — Z13220 Encounter for screening for lipoid disorders: Principal | ICD-10-CM

## 2021-10-31 DIAGNOSIS — M79604 Pain in right leg: Principal | ICD-10-CM

## 2021-10-31 LAB — LIPID PANEL
CHOLESTEROL/HDL RATIO SCREEN: 6.9 — ABNORMAL HIGH (ref 1.0–4.5)
CHOLESTEROL: 254 mg/dL — ABNORMAL HIGH (ref <=200)
HDL CHOLESTEROL: 37 mg/dL — ABNORMAL LOW (ref 40–60)
LDL CHOLESTEROL CALCULATED: 161 mg/dL — ABNORMAL HIGH (ref 40–99)
NON-HDL CHOLESTEROL: 217 mg/dL — ABNORMAL HIGH (ref 70–130)
TRIGLYCERIDES: 280 mg/dL — ABNORMAL HIGH (ref 0–150)
VLDL CHOLESTEROL CAL: 56 mg/dL — ABNORMAL HIGH (ref 11–50)

## 2021-10-31 LAB — IRON & TIBC
IRON SATURATION: 13 % — ABNORMAL LOW (ref 20–55)
IRON: 38 ug/dL — ABNORMAL LOW
TOTAL IRON BINDING CAPACITY: 299 ug/dL (ref 250–425)

## 2021-10-31 LAB — FERRITIN: FERRITIN: 8.8 ng/mL — ABNORMAL LOW

## 2021-10-31 LAB — PHOSPHORUS: PHOSPHORUS: 4.3 mg/dL (ref 2.4–5.1)

## 2021-10-31 LAB — URIC ACID: URIC ACID: 5.6 mg/dL

## 2021-10-31 MED ORDER — LEVOTHYROXINE 125 MCG TABLET
ORAL_TABLET | 3 refills | 0 days | Status: CP
Start: 2021-10-31 — End: ?

## 2021-10-31 NOTE — Unmapped (Signed)
Please follow up with Dr. Rosine Beat in 3-4 weeks.

## 2021-10-31 NOTE — Unmapped (Signed)
Assessment and Plan:       Diagnoses and all orders for this visit:    Encounter for screening for lipid disorder  -     Lipid Panel; Future    Malignant melanoma, unspecified site (CMS-HCC)  -     Ambulatory referral to [facility]    Health care maintenance  -     Ambulatory referral to Family Practice    Malignant neoplasm of thyroid gland (CMS-HCC)    Screening for deficiency anemia  -     Iron & TIBC; Future  -     Ferritin; Future    Pain in both lower extremities  -     Phosphorus Level  -     Uric acid; Future    Other orders  -     levothyroxine (SYNTHROID) 125 MCG tablet; Take 375 mcg daily; please dispense 90-day supply            Subjective:     Kenneth Fitzgerald 46 y.o.male here to establish care.   Last saw PCP 7-8 years.     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 He does not have any children but does have a dog  Does not have any children  Has a sister that lives close and then additional sister/brother in the [place]. Going to see brother next week     FH of MI: father has had multiple     Concern:   -has been getting leg cramps at night for the past few weeks   -will stretch it out and last 1-2 minutes  -usually occurs 1-2 times     -drinks 36 oz a day       ROS:     Review of systems negative unless otherwise noted as per HPI    He denies     Vital Signs:     Wt Readings from Last 3 Encounters:   10/31/21 (!) 148.1 kg (326 lb 9.6 oz)   10/18/21 (!) 149.2 kg (329 lb)   10/10/21 (!) 147.2 kg (324 lb 9.6 oz)     Temp Readings from Last 3 Encounters:   10/31/21 36.7 ??C (98 ??F) (Oral)   10/18/21 35.9 ??C (96.7 ??F) (Temporal)   08/29/21 35.6 ??C (96 ??F) (Temporal)     BP Readings from Last 3 Encounters:   10/31/21 142/95   10/18/21 161/97   10/10/21 155/98     Pulse Readings from Last 3 Encounters:   10/31/21 69   10/18/21 76   10/10/21 96     Oral Temperature: 98      Objective:     General Appearance: Alert, cooperative, no distress, appears stated age.   EYES: PERRL, conjunctiva/corneas clear, EOM's intact, fundi  benign, both eyes  ENT:  External canals clear, Tympanic membrane pearly grey with normal light reflex bilaterally.   NECK: No carotid bruits.  No palpable cervical or supraclavicular lymphadenopathy. Thyroid smooth, normal size  CV: Regular rate and rhythm. Normal S1 and S2. No murmurs, gallops, or rubs.  No dependent edema noted  RESP: Normal respiratory effort.  Clear to auscultation bilaterally without wheezes,  rhonchi or crackles.   GI: Normal abdominal bowel sounds. Soft, non-tender and non-distended.   VH:QIONGEXB  EXT:  No lower extremity edema.    MSK: Full upright posture, smooth and symmetric gait. All major joints show full range of motion without discomfort. Strength is 5/5 in the upper and lower extremities.   SKIN: No rashes or suspicious focal lesions noted.   LYMPH NODES: Cervical, supraclavicular nodes normal  NEURO: Cranial nerves II- XII grossly intact.  No focal neurologic deficits  PSYCHIATRIC: Alert and oriented x 3. Mood normal. then additional sister/brother in the [facility]. Going to see brother next week     FH of MI: father has had multiple MI    Concern:   -has been getting leg cramps at night for the past few weeks   -will stretch it out and last 1-2 minutes  -usually occurs 1-2 times   -drinks 36 oz a day of water a day      ROS:     Review of systems negative unless otherwise noted as per HPI  Reports bloody stools due to crohns.   Denies chest pain, palpitations, lightheadedness, dizziness, headaches    Vital Signs:     Wt Readings from Last 3 Encounters:   10/31/21 (!) 148.1 kg (326 lb 9.6 oz)   10/18/21 (!) 149.2 kg (329 lb)   10/10/21 (!) 147.2 kg (324 lb 9.6 oz)     Temp Readings from Last 3 Encounters:   10/31/21 36.7 ??C (98 ??F) (Oral)   10/18/21 35.9 ??C (96.7 ??F) (Temporal)   08/29/21 35.6 ??C (96 ??F) (Temporal)     BP Readings from Last 3 Encounters:   10/31/21 142/95   10/18/21 161/97   10/10/21 155/98     Pulse Readings from Last 3 Encounters:   10/31/21 69   10/18/21 76   10/10/21 96       Colonoscopy findings/impression/recommendations (08/29/2021)  Findings:       The perianal and digital rectal examinations were normal.       There was evidence of a prior functional end-to-end colo-colonic        anastomosis in the descending colon. This was patent and was        characterized by healthy appearing mucosa. The anastomosis was traversed.       A localized area of mildly friable, scarred mucosa with spontaneous        bleeding was found at the ileocecal valve. Biopsies were taken with a        cold forceps for histology.       Scattered pseudopolyps were found in the transverse colon. This was        biopsied with a cold forceps for histology. No active inflammation.       Multiple small and large-mouthed diverticula were found in the entire        colon.       A diffuse area of mildly to moderately erythematous, edematous, granular        mucosa was found in the rectum (0-20cm). Biopsies were taken with a cold forceps for histology.  Impression:            - Patent functional end-to-end colo-colonic                          anastomosis, characterized by healthy appearing mucosa.                         - Friability and scarring with contact bleeding at the                          ileocecal valve. Biopsied.                         - Pseudopolyps in the transverse colon. Biopsied.                         - Diverticulosis in the entire examined colon.                         - Erythematous, granular, edematous mucosa in the                          rectum. This is worse compared wtih prior and appears                          consistent with mildlly active Crohn's colitis.                          Biopsied to rule out melanoma.  Recommendation:        - Patient has a contact number available for                          emergencies. The signs and symptoms of potential                          delayed complications were discussed with the patient.                          Return to normal activities tomorrow. Written                          discharge instructions were provided to the patient.                         - Resume previous diet.                         - Continue present medications.                         - Increase sulfasalazine to 2 grams by mouth twice                          daily. I sent in a new prescription to your [facility].                         -  Contact my office at [phone] to schedule a                          clinic visit with me. Telehealth is OK if you prefer.                         - Await pathology results.      Objective:     General Appearance: Alert, cooperative, no distress,  EYES: EOMI  NECK: No palpable cervical or supraclavicular lymphadenopathy or masses noted on exam. .   CV: Regular rate and rhythm.  RESP: Normal respiratory effort.  Clear to auscultation bilaterally without wheezes, rhonchi or crackles.   EXT:  No lower extremity edema. Homan sign negative.  MSK: gait stable  SKIN: No rashes or suspicious focal lesions noted. Incision scars on neck.   NEURO: Cranial nerves II- XII grossly intact.  No focal neurologic deficits  PSYCHIATRIC: Alert and oriented x 3. Mood normal.

## 2021-10-31 NOTE — Unmapped (Signed)
Kenneth Fitzgerald 's [facility] shipment will be sent out  as a result of prior authorization now approved.     I have reached out to the patient  at [phone] and communicated the delivery change. We will reschedule the medication for the delivery date that the patient agreed upon.  We have confirmed the delivery date as 10/20, via ups.

## 2021-11-01 NOTE — Unmapped (Signed)
Patient missed a call from the office.    Please call him again.

## 2021-11-02 DIAGNOSIS — Z5181 Encounter for therapeutic drug level monitoring: Principal | ICD-10-CM

## 2021-11-02 NOTE — Unmapped (Signed)
Complex Case Management  SUMMARY NOTE    High Risk Care Coordinator  spoke with patient and verified correct patient using two identifiers today to introduce the Complex Case Management program.     Discussed the following:  Program Services, Expectations of participation, and Verified Demographics    Program status: Interested    Discuss at next visit: Complete Pre-Assessment    Scheduled for: 11/05/21 at 3:00pm with High Risk Care Coordinator       Deliah Goody - High Risk Care Coordinator   [facility]  [address], [address]  [place], [place] [address]  P: [phone] F: [phone]  [email or web address]

## 2021-11-02 NOTE — Unmapped (Signed)
Contacted patient regarding results. He confirmed his name and birthday.   Discussed his cholesterol panel was abnormal and given findings on CT Chest exam, would recommend statin which he is agreeable to starting.   I stated however I would like him to discuss with pharmacy regarding his medications and interactions with statin. He is agreeable to this. I have reached out to PharmD regarding this as well.   ASCVD risk at this time: 5.7%.   Discussed also will need to discuss with pharmacist regarding anti-hypertensive and current medications to ensure no interactions.   We discussed his iron storage and ferritin were low. I advised taht I would reach out to Dr. Nedra Hai regarding and will discuss PO iron. Could also try IV Venofer as well.   He had no additional questions or concerns at this time

## 2021-11-05 NOTE — Unmapped (Signed)
COMPLEX CASE MANAGEMENT   Brief Note    Care Coordinator left a voicemail with patient to reschedule pre assessment call that is originally scheduled at 11/05/21 at 3:00pm. Care Coordinator will attempt patient at a later time.       Kenneth Fitzgerald - High Risk Care Coordinator   [facility]  [address]  [place], [place] [address]  P: [phone] F: [phone]  [email or web address]

## 2021-11-05 NOTE — Unmapped (Signed)
Complex Case Management Pre-Assessment Note  Pre-Assessment  NOTE      Summary:  Care Coordinator spoke with patient and verified correct patient using two identifiers today for enrollment in Complex Case Management. Informed patient of Complex Case Management services. Pt has agreed to participate in the Complex Case Management program. Case Manager contact information was provided to patient. Care Coordinator scheduled initial assessment with Case Manager for 11/12/21 at 2:00 PM     General Case management Questions:     General Care Management - Patient Level    Assessment completed with: patient[KH1.1]  Patient lives with: alone[KH1.1]  Support system: siblings, friends (Comment: siblings and friends would provide emotional support)[KH1.1]  Type of residence: private residence[KH1.1]  DME used at home: none[KH1.1]  Transportation means: personal vehicle[KH1.1]  Does your health interfere with activities of daily living?: sleep, self-care, household management, social interactions, work, exercise[KH1.1]  Exercise: yes[KH1.1]  Type of exercise: walking;shoulder rehab-when he can[KH1.1]  Follow special diet?: regular[KH1.1]  Interested in seeing dietician?: Yes (Comment: iron low;cholesterol high;weight management)[KH1.1]  Experiencing side effects from current medications: No (Comment: he is unsure)[KH1.1]  Interested in seeing pharmacist?: No (Comment: not at this time)[KH1.1]  Difficulty keeping appointments: No (Comment: not at this time)[KH1.1]  Need assistance with community resources?: Yes (Comment: disability retirement questions and resources)[KH1.1]  Other significant issues impacting care?: None at this time[KH1.1]       Attribution       KH1.1 Steffanie Rainwater 11/05/21 11:10             History Review:     Past Medical History:   Diagnosis Date    Ankylosing spondylitis (CMS-HCC)     Cancer (CMS-HCC)     melanoma, thyroid cancer    Cognitive impairment     since brain radiation he has noted some Crohn's disease (CMS-HCC)     Disease of thyroid gland     hypothyroid    Gout     Hearing impairment     hearing loss in both ears    HL (hearing loss)     Hypothyroidism     Skin cancer      Caregiver burden No   Cognitive Impairment Yes   Falls Risk No   Financial difficulty No   Frail Elderly No   Hearing impairment/loss Yes   Homeless No   Impaired mobility No   Inadequate social/family support No   Ineffective family coping No   Low Literacy No   Nonadherence to medication No   Non-english speaking No   Terminal Illness/Hospice No   Transportation barriers No   Visual impairment No     Past Surgical History:   Procedure Laterality Date    PR COLONOSCOPY FLX DX W/COLLJ SPEC WHEN PFRMD N/A 08/29/2021    Procedure: COLONOSCOPY, FLEXIBLE, PROXIMAL TO SPLENIC FLEXURE; DIAGNOSTIC, W/WO COLLECTION SPECIMEN BY BRUSH OR WASH;  Surgeon: Luanne Bras, MD;  [facility];  Service: Gastroenterology    PR COLONOSCOPY W/BIOPSY SINGLE/MULTIPLE N/A 01/09/2018    Procedure: COLONOSCOPY, FLEXIBLE, PROXIMAL TO SPLENIC FLEXURE; WITH BIOPSY, SINGLE OR MULTIPLE;  Surgeon: Vonda Antigua, MD;  [facility];  Service: Gastroenterology    PR COLONOSCOPY W/BIOPSY SINGLE/MULTIPLE N/A 08/24/2019    Procedure: COLONOSCOPY, FLEXIBLE, PROXIMAL TO SPLENIC FLEXURE; WITH BIOPSY, SINGLE OR MULTIPLE;  Surgeon: Leland Her, MD;  [facility];  Service: Gastroenterology    PR COLONOSCOPY W/BIOPSY SINGLE/MULTIPLE N/A 08/29/2021  Procedure: COLONOSCOPY, FLEXIBLE, PROXIMAL TO SPLENIC FLEXURE; WITH BIOPSY, SINGLE OR MULTIPLE;  Surgeon: Luanne Bras, MD;  Location: [facility] PROC[facility];  Service: Gastroenterology    PR COLSC FLX WITH DIRECTED SUBMUCOSAL NJX ANY SBST N/A 08/24/2019    Procedure: COLONOSCOPY, FLEXIBLE, PROXIMAL TO SPLENIC FLEXURE; WITH DIRECTED SUBMUCOSAL INJECTION(S), ANY SUBSTANCE;  Surgeon: Leland Her, MD;  [facility]: [facility] PROCED[facility];  Service: Gastroenterology    PR EXPLORATORY OF ABDOMEN N/A 01/24/2020    Procedure: EXPLORATORY LAPAROTOMY, EXPLORATORY CELIOTOMY WITH OR WITHOUT BIOPSY(S);  Surgeon: [name], MD;  Location: MAIN OR [place];  Service: Trauma    PR FREEING BOWEL ADHESION,ENTEROLYSIS N/A 01/24/2020    Procedure: ENTEROLYSIS (SEPART PROC);  Surgeon: [name], MD;  Location: MAIN OR [facility];  Service: Trauma    PR IMPLANT MESH HERNIA REPAIR/DEBRIDEMENT CLOSURE N/A 01/03/2015    Procedure: IMPLANTATION OF MESH/OTHER PROSTHES INCISION/VENTRAL HERNIA REPAIR/MESH CLOSE DEBRID NECROT SOFT TIS INFECT;  Surgeon: Romero Belling, MD;  Location: MAIN OR [place];  Service: Gastrointestinal    PR LAP, VENTRAL HERNIA REPAIR,REDUCIBLE N/A 04/07/2014    Procedure: LAPAROSCOPY, SURGICAL, REPAIR, VENTRAL, UMBILICAL, SPIGELIAN OR EPIGASTRIC HERNIA, REDUCIBLE;  Surgeon: Romero Belling, MD;  Location: MAIN OR [place];  Service: Gastrointestinal    PR NEGATIVE PRESSURE WOUND THERAPY DME >50 SQ CM N/A 01/24/2020    Procedure: NEG PRESS WOUND TX (VAC ASSIST) INCL TOPICALS, PER SESSION, TSA GREATER THAN/= 50 CM SQUARED;  Surgeon: [name], MD;  Location: MAIN OR [place];  Service: Trauma    PR REMOVAL NODES, NECK,CERV CMPLT Left 12/07/2012    Procedure: CERVICAL LYMPHADENECTOMY (COMPLETE);  Surgeon: Charlott Rakes, MD;  Location: MAIN OR [facility];  Service: Surgical Oncology    PR REMOVAL NODES, NECK,CERV MOD RAD Right 03/29/2013    Procedure: CERVICAL LYMPHADENECTOMY (MODIFIED RADICAL NECK DISSECTION);  Surgeon: Charlott Rakes, MD;  Location: MAIN OR [facility];  Service: Surgical Oncology    PR REPAIR RECURR INCIS [facility] N/A 01/03/2015    Procedure: REPAIR RECURRENT INCISIONAL OR VENTRAL HERNIA; REDUCIBLE;  Surgeon: Romero Belling, MD;  Location: MAIN OR [facility];  Service: Gastrointestinal    PR REPAIR RECURR INCIS HERNIA,STRANG N/A 06/21/2016    Procedure: REPAIR RECURRENT INCISIONAL OR VENTRAL HERNIA; INCARCERATED OR STRANGULATED;  Surgeon: Mickle Asper, MD;  Location: MAIN OR [facility];  Service: Gastrointestinal    PR REPAIR RECURR INCIS HERNIA,STRANG N/A 01/24/2020    Procedure: REPAIR RECURRENT INCISIONAL OR VENTRAL HERNIA; INCARCERATED OR STRANGULATED;  Surgeon: [name], MD;  Location: MAIN OR [place];  Service: Trauma    PR SIGMOIDOSCOPY,FINE NEEDL BX,US GUIDED N/A 01/09/2018    Procedure: SIGMOIDOSCOPY, FLEXIBLE, W/TRANSENDOSCOPIC ULTRASOUND GUIDED NEEDLE ASPIRATION;  Surgeon: Vonda Antigua, MD;  [facility]: [facility];  Service: Gastroenterology     Family History   Problem Relation Age of Onset    Hyperthyroidism Mother     Osteoporosis Mother     Arrhythmia Mother     Squamous cell carcinoma Mother         basal cell vs squamous cell skin cancer    Coronary artery disease Father         s/p CABG    Diabetes Father     Hypertension Father     Prostate cancer Father     Colon cancer Paternal Grandmother     Thyroid disease Neg Hx      Counseling given: Not Answered    Counseling given: Not Answered  Social History     Substance and Sexual Activity   Drug Use No     Social History     Substance and Sexual Activity   Sexual Activity Not on file       Self-Efficacy Score:  SCORE: 3.17 (11/05/2021 11:16 AM)      Medications:   Outpatient Encounter Medications as of 11/05/2021   Medication Sig Dispense Refill    acetaminophen (TYLENOL) 500 MG tablet Take 2 tablets (1,000 mg total) by mouth every eight (8) hours as needed for pain. 30 tablet 0    binimetinib ([name]) 15 mg tablet Take 3 tablets (45 mg total) by mouth two (2) times a day. 180 tablet 11    blood sugar diagnostic Strp Dispense 100 blood glucose test strips, ok to sub any brand preferred by insurance/patient, use 3x/day; dispense whatever brand matches with meter. 100 strip 12    blood-glucose meter kit Use as instructed; dispense 1 meter, whatever is preferred by insurance 1 each 1    calcitriol (ROCALTROL) 0.25 MCG capsule Take 1 capsule (0.25 mcg total) by mouth daily. 90 capsule 3    calcium carbonate 650 mg calcium (1,625 mg) tablet Take 1 tablet (650 mg of elem calcium total) by mouth Three (3) times a day with a meal.      DULoxetine (CYMBALTA) 30 MG capsule Take 1 capsule (30 mg total) by mouth Two (2) times a day. 180 capsule 2    encorafenib (BRAFTOVI) 75 mg capsule Take 6 capsules (450 mg total) by mouth daily. 180 capsule 11    fexofenadine (ALLEGRA ALLERGY) 180 MG tablet Take 1 tablet (180 mg total) by mouth daily as needed (allergies).      folic acid (FOLVITE) 1 MG tablet TAKE 1 TABLET BY MOUTH EVERY DAY 90 tablet 0    ibuprofen (ADVIL,MOTRIN) 200 MG tablet Take 3 tablets (600 mg total) by mouth daily as needed for pain.      lancets Misc Dispense 100 lancets, ok to sub any brand preferred by insurance/patient, use 3x/day 100 each 12    levothyroxine (SYNTHROID) 125 MCG tablet Take 375 mcg daily; please dispense 90-day supply 270 tablet 3    memantine (NAMENDA) 10 MG tablet Take 1 tablet (10 mg total) by mouth Two (2) times a day. 60 tablet 6    mirtazapine (REMERON) 7.5 MG tablet Take 1 tablet (7.5 mg total) by mouth nightly. 90 tablet 3    ofloxacin (OCUFLOX) 0.3 % ophthalmic solution Instill 4 drops in left ear twice daily for 5 days 5 mL 3    ondansetron (ZOFRAN) 8 MG tablet Take 1 tablet (8 mg total) by mouth Take as directed.      sulfaSALAzine (AZULFIDINE) 500 mg tablet Take 4 tablets (2,000 mg total) by mouth two (2) times a day. 240 tablet 11     No facility-administered encounter medications on file as of 11/05/2021.        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  (11/05/2021)    Overall Financial Resource Strain (CARDIA)     Difficulty of Paying Living Expenses: Not very hard      Food Insecurity: No Food Insecurity (11/05/2021)    Hunger Vital Sign     Worried About Running Out of Food in the Last Year: Never true     Ran Out of Food in the Last Year: Never true Transportation Needs: No Transportation Needs (11/05/2021)    PRAPARE - Transportation  Lack of Transportation (Medical): No     Lack of Transportation (Non-Medical): No      Physical Activity: Inactive (11/05/2021)    Exercise Vital Sign     Days of Exercise per Week: 0 days     Minutes of Exercise per Session: 0 min      Stress: Stress Concern Present (11/05/2021)    [employer] of Occupational Health - Occupational Stress Questionnaire     Feeling of Stress : To some extent      Intimate Partner Violence: Not At Risk (11/05/2021)    Humiliation, Afraid, Rape, and Kick questionnaire     Fear of Current or Ex-Partner: No     Emotionally Abused: No     Physically Abused: No     Sexually Abused: No      Alcohol Use: Not At Risk (11/05/2021)    Alcohol Use     How often do you have a drink containing alcohol?: Monthly or less     How many drinks containing alcohol do you have on a typical day when you are drinking?: 1 - 2     How often do you have 5 or more drinks on one occasion?: Never      Tobacco Use: Low Risk  (11/05/2021)    Patient History     Smoking Tobacco Use: Never     Smokeless Tobacco Use: Never     Passive Exposure: Not on file      Depression: Not at risk (10/31/2021)    PHQ-2     PHQ-2 Score: 0        Upcoming Appointment (s):   Future Appointments   Date Time Provider Department Center   11/26/2021  2:40 PM Mangel, Benison Pap, DO [facility]   01/16/2022 12:00 PM IC CT RM 1 [facility] [place] - IC   01/16/2022 12:30 PM IC MRI MBL 2 [facility]  - IC   01/16/2022  2:15 PM ADULT ONC LAB [facility]   01/16/2022  3:00 PM [name], MD [facility]   01/16/2022  4:00 PM Ruben Reason, MD [facility]   04/10/2022  1:20 PM Reita Cliche, MD [facility]         This patient is currently under review for Complex Case Management services. For progress, care plan changes, updates or recent discharges please contact CM.       Steffanie Rainwater

## 2021-11-06 DIAGNOSIS — D509 Iron deficiency anemia, unspecified: Principal | ICD-10-CM

## 2021-11-08 DIAGNOSIS — E785 Hyperlipidemia, unspecified: Principal | ICD-10-CM

## 2021-11-08 DIAGNOSIS — I2510 Atherosclerotic heart disease of native coronary artery without angina pectoris: Principal | ICD-10-CM

## 2021-11-08 MED ORDER — ROSUVASTATIN 10 MG TABLET
ORAL_TABLET | Freq: Every evening | ORAL | 0 refills | 90 days | Status: CP
Start: 2021-11-08 — End: 2022-02-06

## 2021-11-12 NOTE — Unmapped (Signed)
Complex Case Management  Assessment  NOTE  11/12/2021      Summary:  [facility] Case Manager spoke with patient and verified correct patient using two identifiers today for enrollment in Complex Case Management. Informed patient of Complex Case Management services. Pt has agreed to participate in the Complex Case Management program. Case Manager contact information was provided to patient.     General Case management Questions:   General Care Management - Patient Level    Assessment completed with: patient[KH1.1]  Patient lives with: alone[KH1.1]  Support system: siblings, friends (Comment: siblings and friends would provide emotional support)[KH1.1]  Type of residence: private residence[KH1.1]  DME used at home: none[KH1.1]  Transportation means: personal vehicle[KH1.1]  Does your health interfere with activities of daily living?: sleep, self-care, household management, social interactions, work, exercise[KH1.1]  Exercise: yes[KH1.1]  Type of exercise: walking;shoulder rehab-when he can[KH1.1]  Follow special diet?: regular[KH1.1]  Interested in seeing dietician?: Yes (Comment: iron low;cholesterol high;weight management)[KH1.1]  Experiencing side effects from current medications: No (Comment: he is unsure)[KH1.1]  Interested in seeing pharmacist?: No (Comment: not at this time)[KH1.1]  Difficulty keeping appointments: No (Comment: not at this time)[KH1.1]  Need assistance with community resources?: Yes (Comment: disability retirement questions and resources)[KH1.1]  Other significant issues impacting care?: None at this time[KH1.1]       Attribution       KH1.1 Steffanie Rainwater 11/05/21 11:10             Primary Health Concerns:  What is your/your child's primary health concern?: stage 4 cancer(melenoma)    Assessment:  Completed By: Patient    Self-Health:  How would you describe you or your child's current physical health?: (!) Fair  How would you describe you or your child's current mental health?: Good  Behavioral health services:: Therapy (Used Dr. Willaim Bane in the cancer hospital in the past)  What concerns would cause you to go to the ED (Emergency Department) rather than your PCP/other provider? : cancer     Oral Health - How would you describe you or your child's oral health?: (!) Poor (radiation)  Have you had a dental or oral exam in the last year?: (!) No    ACS Disability Status:   Are you deaf or do you have serious difficulty hearing?: No  Do you wear hearing aids?: No  Do you use any assistive devices to communicate?: No  Summary of Vision, Driving, Reading/Writing, and Disability Status : Patient denies any major vision issues except night driving and distance. Patient is able to drive. Patient is able to read/write w/o learning disabilities. Patient denies hearing/memory issues.    Self Efficacy Assessment:  How confident are you that you can keep the fatigue caused by your disease from interfering with the things you want to do?: 2  How confident are you that you can keep the physical discomfort or pain of your disease from interfering with the things you want to do?: 4  How confident are you that you can keep the emotional distress caused by your disease from interfering with the things you want to do?: 3  How confident are you that you can keep any other symptoms or health problems you have from interfering with the things you want to do?: 4  How confident are you that you can do the different tasks and activities needed to manage your health condition so as to reduce you need to see a doctor?: 3  How confident are you that you can do  things other than just taking medication to reduce how much you illness affects your everyday life?: 3  SCORE: 3.17    Childhood Experiences:  Did you have any exposure to Adverse Childhood experiences or child trauma?: No    Pediatric ACE:                                     Support Summary:  Summary of Caregiver/Social Support: Patient reports his siblings provide emotional and trasportation if needed. Patient also has church support. Patient denies hx of childhood trauma/abuse.    ADL/IADL:  Dressing - Ability to perform: Independent  Grooming - Ability to perform: Independent  Feeding - Ability to perform: Independent  Bathing - Ability to perform: Independent  Toileting - Ability to perform: Independent  Transfering- Ability to perform: Independent  Continence: Futures trader - Do you have a problem with any of the following:: Independent    Vision:  Patient's Vision Adequate to Safely Complete Daily Activities: Yes (night vision affected)    Driving Concerns:  Do you drive?: Yes   Mode of Transportation: Car    Reading/Writing:  Are you able to read?: Yes   Are you able to write?: Yes  Oriented to person, place, time, situation: Oriented x 4    Patient/Family Memory Concerns:  Do you have concerns about your memory?: No (short term s/p radiation)   Does anyone in your family have concerns about your memory?: No    Learning and Disability:  History of attendance at a Special Education program or setting?: No   Intellectual Disability (formally Mental Retardation)?: No  Learning Disability?: No    CM Assessment:  Describe the patient's cognitive status:: 1  Summary of Memory and Cognitive Status: Patient is A&Ox4.    Barriers to Care:  What are the patient's barriers? (Select all that apply): (!) Health Literacy, Psychiatric Diagnosis    ACP Review:  ACP Reviewed: Yes    Chronic Pain:  Do you/ your child have chronic pain?: (!) Yes (shoulder and back pain. Pain is 6-7. Low back pain goes down to right leg numbness. Right shoulder shooting)    Community Resources:        Dietary Needs:   Do you/your child have any special dietary needs or restrictions?: No    Home Health:  Any Home Health?: None    Insurance Benefits:  What is your current understanding of your insurance benefits?: Very Good    Culture and Beliefs:  Is there anything I should know about your culture, beliefs, or religious practices that would help me take better care of you?: Religious beliefs (christian)    Life Planing Summary:  Summary of Advanced Care and Life Planning: CM provided ACP education and patient has this in place. Patient does not use any community resources, but has asked about disability process. CM discussed contacting social security and that they will provide him information on how to begin process. Patient voiced understanding. Patient does currently work. Patient also volunteers at church. Patient has no hx with the justice system. Patient does not receive home health. Patient has a very good understanding of his insurance benefits. 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 Patient denies any long term services and upcoming life transitions.    CM Summary:  Patient needs/concerns addressed today:: ACP education, PHQ-2, RD referral  Care Plan items covered today:: ACP education, PHQ-2, RD referral  Care plan items planned for next conversation:: medication review      History Review:   Past Medical History:   Diagnosis Date    Ankylosing spondylitis (CMS-HCC)     Cancer (CMS-HCC)     melanoma, thyroid cancer    Cognitive impairment     since brain radiation he has noted some    Crohn's disease (CMS-HCC)     Disease of thyroid gland     hypothyroid    Gout     Hearing impairment     hearing loss in both ears    HL (hearing loss)     HLD (hyperlipidemia) 11/08/2021    Hypothyroidism     Iron deficiency anemia 11/06/2021    Skin cancer      Caregiver burden No   Cognitive Impairment Yes   Falls Risk No   Financial difficulty No   Frail Elderly No   Hearing impairment/loss Yes   Homeless No   Impaired mobility No   Inadequate social/family support No   Ineffective family coping No   Low Literacy No   Nonadherence to medication No   Non-english speaking No   Terminal Illness/Hospice No   Transportation barriers No   Visual impairment No     Past Surgical History:   Procedure Laterality Date    PR COLONOSCOPY FLX DX W/COLLJ SPEC WHEN PFRMD N/A 08/29/2021    Procedure: COLONOSCOPY, FLEXIBLE, PROXIMAL TO SPLENIC FLEXURE; DIAGNOSTIC, W/WO COLLECTION SPECIMEN BY BRUSH OR WASH;  Surgeon: Luanne Bras, MD;  [facility];  Service: Gastroenterology    PR COLONOSCOPY W/BIOPSY SINGLE/MULTIPLE N/A 01/09/2018    Procedure: COLONOSCOPY, FLEXIBLE, PROXIMAL TO SPLENIC FLEXURE; WITH BIOPSY, SINGLE OR MULTIPLE;  Surgeon: Vonda Antigua, MD;  [facility];  Service: Gastroenterology    PR COLONOSCOPY W/BIOPSY SINGLE/MULTIPLE N/A 08/24/2019    Procedure: COLONOSCOPY, FLEXIBLE, PROXIMAL TO SPLENIC FLEXURE; WITH BIOPSY, SINGLE OR MULTIPLE;  Surgeon: Leland Her, MD;  Location: [facility] PROCEDURES [facility];  Service: Gastroenterology    PR COLONOSCOPY W/BIOPSY SINGLE/MULTIPLE N/A 08/29/2021    Procedure: COLONOSCOPY, FLEXIBLE, PROXIMAL TO SPLENIC FLEXURE; WITH BIOPSY, SINGLE OR MULTIPLE;  Surgeon: Luanne Bras, MD;  Location: [facility];  Service: Gastroenterology    PR COLSC FLX WITH DIRECTED SUBMUCOSAL NJX ANY SBST N/A 08/24/2019    Procedure: COLONOSCOPY, FLEXIBLE, PROXIMAL TO SPLENIC FLEXURE; WITH DIRECTED SUBMUCOSAL INJECTION(S), ANY SUBSTANCE;  Surgeon: Leland Her, MD;  Location: [facility];  Service: Gastroenterology    PR EXPLORATORY OF ABDOMEN N/A 01/24/2020    Procedure: EXPLORATORY LAPAROTOMY, EXPLORATORY CELIOTOMY WITH OR WITHOUT BIOPSY(S);  Surgeon: [name], MD;  Location: MAIN OR [place];  Service: Trauma    PR FREEING BOWEL ADHESION,ENTEROLYSIS N/A 01/24/2020    Procedure: ENTEROLYSIS (SEPART PROC);  Surgeon: [name], MD;  Location: MAIN OR [facility];  Service: Trauma    PR IMPLANT MESH HERNIA REPAIR/DEBRIDEMENT CLOSURE N/A 01/03/2015    Procedure: IMPLANTATION OF MESH/OTHER PROSTHES INCISION/VENTRAL HERNIA REPAIR/MESH CLOSE DEBRID NECROT SOFT TIS INFECT; Surgeon: Romero Belling, MD;  Location: MAIN OR [place];  Service: Gastrointestinal    PR LAP, VENTRAL HERNIA REPAIR,REDUCIBLE N/A 04/07/2014    Procedure: LAPAROSCOPY, SURGICAL, REPAIR, VENTRAL, UMBILICAL, SPIGELIAN OR EPIGASTRIC HERNIA, REDUCIBLE;  Surgeon: Romero Belling, MD;  Location: MAIN OR [place];  Service: Gastrointestinal    PR NEGATIVE PRESSURE WOUND THERAPY DME >50 SQ CM N/A 01/24/2020    Procedure: NEG PRESS WOUND TX (VAC  ASSIST) INCL TOPICALS, PER SESSION, TSA GREATER THAN/= 50 CM SQUARED;  Surgeon: [name], MD;  Location: MAIN OR [place];  Service: Trauma    PR REMOVAL NODES, NECK,CERV CMPLT Left 12/07/2012    Procedure: CERVICAL LYMPHADENECTOMY (COMPLETE);  Surgeon: Charlott Rakes, MD;  Location: MAIN OR [facility];  Service: Surgical Oncology    PR REMOVAL NODES, NECK,CERV MOD RAD Right 03/29/2013    Procedure: CERVICAL LYMPHADENECTOMY (MODIFIED RADICAL NECK DISSECTION);  Surgeon: Charlott Rakes, MD;  Location: MAIN OR [facility];  Service: Surgical Oncology    PR REPAIR RECURR INCIS [facility] N/A 01/03/2015    Procedure: REPAIR RECURRENT INCISIONAL OR VENTRAL HERNIA; REDUCIBLE;  Surgeon: Romero Belling, MD;  Location: MAIN OR [facility];  Service: Gastrointestinal    PR REPAIR RECURR INCIS HERNIA,STRANG N/A 06/21/2016    Procedure: REPAIR RECURRENT INCISIONAL OR VENTRAL HERNIA; INCARCERATED OR STRANGULATED;  Surgeon: Mickle Asper, MD;  Location: MAIN OR [facility];  Service: Gastrointestinal    PR REPAIR RECURR INCIS HERNIA,STRANG N/A 01/24/2020    Procedure: REPAIR RECURRENT INCISIONAL OR VENTRAL HERNIA; INCARCERATED OR STRANGULATED;  Surgeon: [name], MD;  Location: MAIN OR [place];  Service: Trauma    PR SIGMOIDOSCOPY,FINE NEEDL BX,US GUIDED N/A 01/09/2018    Procedure: SIGMOIDOSCOPY, FLEXIBLE, W/TRANSENDOSCOPIC ULTRASOUND GUIDED NEEDLE ASPIRATION;  Surgeon: Vonda Antigua, MD;  [facility]: [facility];  Service: Gastroenterology     Family History   Problem Relation Age of Onset Hyperthyroidism Mother     Osteoporosis Mother     Arrhythmia Mother     Squamous cell carcinoma Mother         basal cell vs squamous cell skin cancer    Coronary artery disease Father         s/p CABG    Diabetes Father     Hypertension Father     Prostate cancer Father     Colon cancer Paternal Grandmother     Thyroid disease Neg Hx      Counseling given: Not Answered    Counseling given: Not Answered                     Social History     Substance and Sexual Activity   Drug Use No       Medications:   Outpatient Encounter Medications as of 11/12/2021   Medication Sig Dispense Refill    acetaminophen (TYLENOL) 500 MG tablet Take 2 tablets (1,000 mg total) by mouth every eight (8) hours as needed for pain. 30 tablet 0    binimetinib ([name]) 15 mg tablet Take 3 tablets (45 mg total) by mouth two (2) times a day. 180 tablet 11    blood sugar diagnostic Strp Dispense 100 blood glucose test strips, ok to sub any brand preferred by insurance/patient, use 3x/day; dispense whatever brand matches with meter. 100 strip 12    blood-glucose meter kit Use as instructed; dispense 1 meter, whatever is preferred by insurance 1 each 1    calcitriol (ROCALTROL) 0.25 MCG capsule Take 1 capsule (0.25 mcg total) by mouth daily. 90 capsule 3    calcium carbonate 650 mg calcium (1,625 mg) tablet Take 1 tablet (650 mg of elem calcium total) by mouth Three (3) times a day with a meal.      DULoxetine (CYMBALTA) 30 MG capsule Take 1 capsule (30 mg total) by mouth Two (2) times a day. 180 capsule 2    encorafenib (BRAFTOVI) 75 mg capsule Take 6 capsules (  450 mg total) by mouth daily. 180 capsule 11    fexofenadine (ALLEGRA ALLERGY) 180 MG tablet Take 1 tablet (180 mg total) by mouth daily as needed (allergies).      folic acid (FOLVITE) 1 MG tablet TAKE 1 TABLET BY MOUTH EVERY DAY 90 tablet 0    ibuprofen (ADVIL,MOTRIN) 200 MG tablet Take 3 tablets (600 mg total) by mouth daily as needed for pain.      lancets Misc Dispense 100 lancets, ok to sub any brand preferred by insurance/patient, use 3x/day 100 each 12    levothyroxine (SYNTHROID) 125 MCG tablet Take 375 mcg daily; please dispense 90-day supply 270 tablet 3    memantine (NAMENDA) 10 MG tablet Take 1 tablet (10 mg total) by mouth Two (2) times a day. 60 tablet 6    mirtazapine (REMERON) 7.5 MG tablet Take 1 tablet (7.5 mg total) by mouth nightly. 90 tablet 3    ofloxacin (OCUFLOX) 0.3 % ophthalmic solution Instill 4 drops in left ear twice daily for 5 days 5 mL 3    ondansetron (ZOFRAN) 8 MG tablet Take 1 tablet (8 mg total) by mouth Take as directed.      rosuvastatin (CRESTOR) 10 MG tablet Take 1 tablet (10 mg total) by mouth nightly. 90 tablet 0    sulfaSALAzine (AZULFIDINE) 500 mg tablet Take 4 tablets (2,000 mg total) by mouth two (2) times a day. 240 tablet 11     No facility-administered encounter medications on file as of 11/12/2021.        Social History Review:   Programmer, applications: Low Risk  (11/05/2021)    Overall Financial Resource Strain (CARDIA)     Difficulty of Paying Living Expenses: Not very hard      Food Insecurity: No Food Insecurity (11/05/2021)    Hunger Vital Sign     Worried About Running Out of Food in the Last Year: Never true     Ran Out of Food in the Last Year: Never true      Transportation Needs: No Transportation Needs (11/05/2021)    PRAPARE - Therapist, art (Medical): No     Lack of Transportation (Non-Medical): No      Physical Activity: Inactive (11/05/2021)    Exercise Vital Sign     Days of Exercise per Week: 0 days     Minutes of Exercise per Session: 0 min      Stress: Stress Concern Present (11/05/2021)    [employer] of Occupational Health - Occupational Stress Questionnaire     Feeling of Stress : To some extent      Intimate Partner Violence: Not At Risk (11/05/2021)    Humiliation, Afraid, Rape, and Kick questionnaire     Fear of Current or Ex-Partner: No     Emotionally Abused: No     Physically Abused: No     Sexually Abused: No      Alcohol Use: Not At Risk (11/05/2021)    Alcohol Use     How often do you have a drink containing alcohol?: Monthly or less     How many drinks containing alcohol do you have on a typical day when you are drinking?: 1 - 2     How often do you have 5 or more drinks on one occasion?: Never      Tobacco Use: Low Risk  (11/08/2021)    Patient History     Smoking Tobacco Use: Never  Smokeless Tobacco Use: Never     Passive Exposure: Not on file      Depression: Not at risk (10/31/2021)    PHQ-2     PHQ-2 Score: 0        PHQ-2 Score:  PHQ-2 Total Score : 1   PHQ-9 Score:        Care Plan:   Care plan shared with patient and provider: Yes    Patient Rights and Responsibilities:  Patient Rights and Responsibilities reviewed and provided with enclosed Welcome Letter via Care Plan : Yes    Upcoming Appointment (s):   Future Appointments   Date Time Provider Department Center   11/26/2021  2:40 PM Mangel, Benison Pap, DO [facility] [facility]   11/27/2021  3:00 PM Darrin Luis, RD/LDN [place] [facility]   01/16/2022 12:00 PM IC CT RM 1 [facility] [place] - IC   01/16/2022 12:30 PM IC MRI MBL 2 I[facility]IR[facility] [place] - IC   01/16/2022  2:15 PM ADULT ONC LAB [facility]   01/16/2022  3:00 PM [name], MD [facility]   01/16/2022  4:00 PM Ruben Reason, MD [facility]   04/10/2022  1:20 PM Reita Cliche, MD [facility]       This patient is currently receiving Complex Case Management services.      Primary Case Manager: Sherlyn Hay, RN [phone]  Please contact CM for care plan changes, updates or recent discharges.    High Risk Drivers: Complex Diagnosis  Primary Disease Process: Depression Cancer Chronic Pain Chron's, Melanoma stage IV, Thyroid III  Current Residence: Home alone  Primary Medical Home: Mangel, Benison Pap, DO???s office  [phone]  Referred to Strategic Scheduling for assistance establishing a PCP: no   Current services: None  Patient's Primary Concern is/goals are: Improve chronic condition management  Barriers: Psychiatric Diagnosis and Health Literacy  Strengths: Self-advocacy, Family connection, and Spiritual/faith connection  Supports: Siblings and Faith Community  Interventions provided: Contact Information Provided, Introduction to ICM, Advanced Care Planning Education, RD Referral, and Supportive Listening   Follow up with ICM Team Member: 3 weeks, 12/03/21      Sherlyn Hay, RN

## 2021-11-21 ENCOUNTER — Ambulatory Visit: Admit: 2021-11-21 | Discharge: 2021-11-22 | Payer: PRIVATE HEALTH INSURANCE

## 2021-11-21 LAB — IRON PANEL
IRON SATURATION: 9 % — ABNORMAL LOW (ref 20–55)
IRON: 28 ug/dL — ABNORMAL LOW
TOTAL IRON BINDING CAPACITY: 318 ug/dL (ref 250–425)

## 2021-11-21 LAB — CBC W/ AUTO DIFF
BASOPHILS ABSOLUTE COUNT: 0.1 10*9/L (ref 0.0–0.1)
BASOPHILS RELATIVE PERCENT: 0.8 %
EOSINOPHILS ABSOLUTE COUNT: 0.3 10*9/L (ref 0.0–0.5)
EOSINOPHILS RELATIVE PERCENT: 4.7 %
HEMATOCRIT: 37.8 % — ABNORMAL LOW (ref 39.0–48.0)
HEMOGLOBIN: 12.3 g/dL — ABNORMAL LOW (ref 12.9–16.5)
LYMPHOCYTES ABSOLUTE COUNT: 1.1 10*9/L (ref 1.1–3.6)
LYMPHOCYTES RELATIVE PERCENT: 16.7 %
MEAN CORPUSCULAR HEMOGLOBIN CONC: 32.6 g/dL (ref 32.0–36.0)
MEAN CORPUSCULAR HEMOGLOBIN: 25.1 pg — ABNORMAL LOW (ref 25.9–32.4)
MEAN CORPUSCULAR VOLUME: 77 fL — ABNORMAL LOW (ref 77.6–95.7)
MEAN PLATELET VOLUME: 8.5 fL (ref 6.8–10.7)
MONOCYTES ABSOLUTE COUNT: 0.4 10*9/L (ref 0.3–0.8)
MONOCYTES RELATIVE PERCENT: 6.8 %
NEUTROPHILS ABSOLUTE COUNT: 4.6 10*9/L (ref 1.8–7.8)
NEUTROPHILS RELATIVE PERCENT: 71 %
PLATELET COUNT: 247 10*9/L (ref 150–450)
RED BLOOD CELL COUNT: 4.91 10*12/L (ref 4.26–5.60)
RED CELL DISTRIBUTION WIDTH: 19.9 % — ABNORMAL HIGH (ref 12.2–15.2)
WBC ADJUSTED: 6.5 10*9/L (ref 3.6–11.2)

## 2021-11-21 LAB — FERRITIN: FERRITIN: 10.5 ng/mL

## 2021-11-21 MED ORDER — DULOXETINE 30 MG CAPSULE,DELAYED RELEASE
ORAL_CAPSULE | Freq: Two times a day (BID) | ORAL | 2 refills | 90 days | Status: CP
Start: 2021-11-21 — End: ?

## 2021-11-21 MED ADMIN — diphenhydrAMINE (BENADRYL) capsule 50 mg: 50 mg | ORAL | @ 19:00:00 | Stop: 2021-11-21

## 2021-11-21 MED ADMIN — iron dextran (INFED) 1,220 mg in sodium chloride (NS) 0.9 % 500 mL IVPB: 1220 mg | INTRAVENOUS | @ 21:00:00 | Stop: 2021-11-21

## 2021-11-21 MED ADMIN — sodium chloride (NS) 0.9 % infusion: 20 mL/h | INTRAVENOUS | @ 19:00:00 | Stop: 2021-11-21

## 2021-11-21 MED ADMIN — iron dextran (INFED) 25 mg in sodium chloride (NS) 0.9 % 25 mL IVPB: 25 mg | INTRAVENOUS | @ 20:00:00 | Stop: 2021-11-21

## 2021-11-21 MED ADMIN — acetaminophen (TYLENOL) tablet 650 mg: 650 mg | ORAL | @ 19:00:00 | Stop: 2021-11-21

## 2021-11-22 NOTE — Unmapped (Signed)
Pt in unit for cycle one of infed per orders; pt resting in chair, call light in reach. Orders reviewed, premeds given. Test dose given with no complications; remaining dose given per orders; pt remained in unit for 30 minute observation

## 2021-11-22 NOTE — Unmapped (Signed)
Pharmacy: First Dose IV Iron Patient Education    Medication: iron dextran  Venous Access: PIV  Caregivers present for education: none    Kenneth Fitzgerald is a 45 year old with iron deficiency and melanoma. Education was provided to the patient by an oncology pharmacist.      Side effects discussed included but were not limited to:   infusion-related reactions, extravasation, nausea/vomiting, diarrhea, constipation, taste changes, rash, myalgia, or fatigue.    The [facility] patient handout or the Hematology/Oncology Fellow on-call phone number for concerns after hours were given.The patient verbalized understanding of this information.  Medication reconciliation was completed and the medication list was updated in EPIC.      Approximate time spent with patient: 10  Minutes.    Kennon Holter, PharmD, CPP

## 2021-11-27 NOTE — Unmapped (Unsigned)
Nutrition Consult Note    Weight History:  Wt Readings from Last 6 Encounters:   11/21/21 (!) 148.9 kg (328 lb 4.2 oz)   10/31/21 (!) 148.1 kg (326 lb 9.6 oz)   10/18/21 (!) 149.2 kg (329 lb)   10/10/21 (!) 147.2 kg (324 lb 9.6 oz)   08/29/21 (!) 147.4 kg (325 lb)   08/22/21 (!) 147 kg (324 lb)       Relevant Medications, Herbs, Supplements:    Current Outpatient Medications:     acetaminophen (TYLENOL) 500 MG tablet, Take 2 tablets (1,000 mg total) by mouth every eight (8) hours as needed for pain., Disp: 30 tablet, Rfl: 0    binimetinib ([name]) 15 mg tablet, Take 3 tablets (45 mg total) by mouth two (2) times a day., Disp: 180 tablet, Rfl: 11    blood sugar diagnostic Strp, Dispense 100 blood glucose test strips, ok to sub any brand preferred by insurance/patient, use 3x/day; dispense whatever brand matches with meter., Disp: 100 strip, Rfl: 12    blood-glucose meter kit, Use as instructed; dispense 1 meter, whatever is preferred by insurance, Disp: 1 each, Rfl: 1    calcitriol (ROCALTROL) 0.25 MCG capsule, Take 1 capsule (0.25 mcg total) by mouth daily., Disp: 90 capsule, Rfl: 3    calcium carbonate 650 mg calcium (1,625 mg) tablet, Take 1 tablet (650 mg of elem calcium total) by mouth Three (3) times a day with a meal., Disp: , Rfl:     DULoxetine (CYMBALTA) 30 MG capsule, Take 1 capsule (30 mg total) by mouth two (2) times a day., Disp: 180 capsule, Rfl: 2    encorafenib (BRAFTOVI) 75 mg capsule, Take 6 capsules (450 mg total) by mouth daily., Disp: 180 capsule, Rfl: 11    fexofenadine (ALLEGRA ALLERGY) 180 MG tablet, Take 1 tablet (180 mg total) by mouth daily as needed (allergies)., Disp: , Rfl:     folic acid (FOLVITE) 1 MG tablet, TAKE 1 TABLET BY MOUTH EVERY DAY, Disp: 90 tablet, Rfl: 0    ibuprofen (ADVIL,MOTRIN) 200 MG tablet, Take 3 tablets (600 mg total) by mouth daily as needed for pain., Disp: , Rfl:     lancets Misc, Dispense 100 lancets, ok to sub any brand preferred by insurance/patient, use 3x/day, Disp: 100 each, Rfl: 12    levothyroxine (SYNTHROID) 125 MCG tablet, Take 375 mcg daily; please dispense 90-day supply, Disp: 270 tablet, Rfl: 3    memantine (NAMENDA) 10 MG tablet, Take 1 tablet (10 mg total) by mouth Two (2) times a day., Disp: 60 tablet, Rfl: 6    mirtazapine (REMERON) 7.5 MG tablet, Take 1 tablet (7.5 mg total) by mouth nightly., Disp: 90 tablet, Rfl: 3    ofloxacin (OCUFLOX) 0.3 % ophthalmic solution, Instill 4 drops in left ear twice daily for 5 days, Disp: 5 mL, Rfl: 3    ondansetron (ZOFRAN) 8 MG tablet, Take 1 tablet (8 mg total) by mouth Take as directed., Disp: , Rfl:     rosuvastatin (CRESTOR) 10 MG tablet, Take 1 tablet (10 mg total) by mouth nightly., Disp: 90 tablet, Rfl: 0    sulfaSALAzine (AZULFIDINE) 500 mg tablet, Take 4 tablets (2,000 mg total) by mouth two (2) times a day., Disp: 240 tablet, Rfl: 11    Relevant Labs:  HGB A1C, POC (%)   Date Value   08/30/2019 5.4     Hemoglobin A1C (%)   Date Value   05/10/2020 5.4   01/03/2020 5.8 (H)  06/22/2018 6.0 (H)   09/22/2012 5.2      No components found for: LDLCALC   BP Readings from Last 3 Encounters:   11/21/21 144/85   10/31/21 142/95   10/18/21 161/97     Lab Results   Component Value Date    CHOL 254 (H) 10/31/2021     Lab Results   Component Value Date    HDL 37 (L) 10/31/2021     Lab Results   Component Value Date    LDL 161 (H) 10/31/2021     Lab Results   Component Value Date    VLDL 56 (H) 10/31/2021     Lab Results   Component Value Date    CHOLHDLRATIO 6.9 (H) 10/31/2021     Lab Results   Component Value Date    TRIG 280 (H) 10/31/2021    TRIG 200 (H) 01/27/2020     Lab Results   Component Value Date    VITDTOTAL 31.0 02/27/2017     No results found for: VITAMINB12    24-Hour recall/usual intake:    Time Intake   Breakfast ***   Snack (AM) ***   Lunch ***   Snack (PM) ***   Dinner ***   Snack (HS) ***     Additional Pertinent Nutrition/Lifestyle-Related Information (baseline):  Water: ***  Alcohol intake: ***  Other Beverages: ***    Dining out: ***  Snacks: ***  Nighttime snacking: ***  Eating pace: ***  Emotional eating: ***  Food preparation: ***  Food Insecurity: ***  Likes: ***  Dislikes: ***    Gastrointestinal: ***  Food allergies/intolerances/sensitivities: ***    Edema: ***  Sleep: *** hours; *** quality  Mental Health: *** high/moderate/low stress/anxiety/depression  Tobacco: ***  Patient stated reason(s) for nutrition changes: ***  Other: ***     Physical Activity: {misc; exercise types:16438}   Barriers to physical activity: ***    Barriers:  {AMCSDOHNUTRITION:84213}    Estimated Daily Needs:  {Energy Needs:57594}  *** g protein  {Nutrient Needs:58116:p}    Nutrition Intervention:  {Nutrition Interventions:3047301} - supportive listening    Materials Provided:  None    Discuss at next outreach:   ***    Nutrition Goals:  -continue nutrition therapy***    Follow-up:  ***    Tretha Sciara, RD, LDN, CDCES  Registered Dietitian Nutritionist   Certified Diabetes Care and Education Specialist

## 2021-11-28 DIAGNOSIS — E669 Obesity, unspecified: Principal | ICD-10-CM

## 2021-11-28 DIAGNOSIS — Z713 Dietary counseling and surveillance: Principal | ICD-10-CM

## 2021-11-28 NOTE — Unmapped (Signed)
Patient returned writers phone call to reschedule initial visit. Writer would only be able to speak patient once before completion of program. Provided patient with the option of establishing with ECM RD. Patient in agreement. Referral placed.    Tretha Sciara, RD, LDN, CDCES  Registered Dietitian Nutritionist  Certified Diabetes Care and Education Specialist

## 2021-11-28 NOTE — Unmapped (Signed)
Complex Case Management  SUMMARY NOTE    Attempted to contact pt today at Home number to complete initial nutrition assessment. Left message to return call.; 1st attempt    Discuss at next visit: complete initial nutrition assessment       Tretha Sciara, RD, LDN, CDCES  Registered Dietitian Nutritionist  Certified Diabetes Care and Education Specialist

## 2021-11-29 ENCOUNTER — Ambulatory Visit
Admit: 2021-11-29 | Discharge: 2021-11-30 | Payer: PRIVATE HEALTH INSURANCE | Attending: Student in an Organized Health Care Education/Training Program | Primary: Student in an Organized Health Care Education/Training Program

## 2021-11-29 DIAGNOSIS — C73 Malignant neoplasm of thyroid gland: Principal | ICD-10-CM

## 2021-11-29 DIAGNOSIS — E785 Hyperlipidemia, unspecified: Principal | ICD-10-CM

## 2021-11-29 DIAGNOSIS — I10 Essential (primary) hypertension: Principal | ICD-10-CM

## 2021-11-29 DIAGNOSIS — R252 Cramp and spasm: Principal | ICD-10-CM

## 2021-11-29 DIAGNOSIS — E890 Postprocedural hypothyroidism: Principal | ICD-10-CM

## 2021-11-29 LAB — BASIC METABOLIC PANEL
ANION GAP: 10 mmol/L (ref 5–14)
BLOOD UREA NITROGEN: 21 mg/dL (ref 9–23)
BUN / CREAT RATIO: 21
CALCIUM: 9 mg/dL (ref 8.7–10.4)
CHLORIDE: 106 mmol/L (ref 98–107)
CO2: 24 mmol/L (ref 20.0–31.0)
CREATININE: 0.99 mg/dL
EGFR CKD-EPI (2021) MALE: >90 mL/min/{1.73_m2} (ref >=60)
GLUCOSE RANDOM: 105 mg/dL (ref 70–179)
POTASSIUM: 3.9 mmol/L (ref 3.4–4.8)
SODIUM: 140 mmol/L (ref 135–145)

## 2021-11-29 LAB — T4, FREE: FREE T4: 0.92 ng/dL (ref 0.89–1.76)

## 2021-11-29 LAB — TSH: THYROID STIMULATING HORMONE: 4.608 u[IU]/mL (ref 0.550–4.780)

## 2021-11-29 LAB — CK: CREATINE KINASE TOTAL: 428 U/L — ABNORMAL HIGH

## 2021-11-29 NOTE — Unmapped (Signed)
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       Notes a little more energy since iron infusion     Still having bad leg cramping     Restless leg symptoms at night, notes it is not a nerve thing--does not want nerve conduction study     Muscle cramping/leg cramping for 4-6 weeks   If he flexes his leg it will cramp during the day but mainly happens at night     ROS:     Review of systems negative unless otherwise noted as per HPI    Vital Signs:     Wt Readings from Last 3 Encounters:   11/29/21 (!) 147.7 kg (325 lb 11.2 oz)   11/21/21 (!) 148.9 kg (328 lb 4.2 oz)   10/31/21 (!) 148.1 kg (326 lb 9.6 oz)     Temp Readings from Last 3 Encounters:   11/29/21 36.8 ??C (98.3 ??F) (Temporal)   11/21/21 36.8 ??C (98.2 ??F)   10/31/21 36.7 ??C (98 ??F) (Oral)     BP Readings from Last 3 Encounters:   11/29/21 141/94   11/21/21 144/85   10/31/21 142/95     Pulse Readings from Last 3 Encounters:   11/29/21 87   11/21/21 83   10/31/21 69       Objective:     General Appearance: Alert, cooperative, no distress, appears stated age.   EYES: PERRL, conjunctiva/corneas clear, EOM's intact, fundi  benign, both eyes  ENT:  External canals clear, Tympanic membrane pearly grey with normal light reflex bilaterally.   NECK: No carotid bruits.  No palpable cervical or supraclavicular lymphadenopathy. Thyroid smooth, normal size  CV: Regular rate and rhythm. Normal S1 and S2. No murmurs, gallops, or rubs.  No dependent edema noted  RESP: Normal respiratory effort.  Clear to auscultation bilaterally without wheezes, rhonchi or crackles. declined all vaccinations (he is due for COVID, Pneumococcal, Td booster and Flu)      He noted to have a small skin lesion on the helix of his left ear. Although looks benign to me, given his complex PMH, I Advised that he follow up with dermatology for a further evaluation.     To follow up in 3-6 months or sooner for acute concerns.         Subjective:     Kenneth Fitzgerald [facility] y.o.male  is here for follow up     He notes that he has not started the cholesterol medication yet due to an acute illness.   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   He has had a little more energy since iron infusions     Concerns for today:   -still having bad leg cramping which has been occurring for 4-6 weeks.   -It will  happen more at night but notes it is not a nerve problem   -He also notes that sometimes his leg will cramp as well if he if he flexes his knee  -The pain is significant but denies nerve pain.     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   This has happened before in the past       ROS:     Review of systems negative unless otherwise noted as per HPI    Vital Signs:     Wt Readings from Last 3 Encounters:   11/29/21 (!) 147.7 kg (325 lb 11.2 oz)   11/21/21 (!) 148.9 kg (328 lb 4.2 oz)   10/31/21 (!) 148.1 kg (326 lb 9.6 oz)     Temp Readings from Last 3 Encounters:   11/29/21 36.8 ??C (98.3 ??F) (Temporal)   11/21/21 36.8 ??C (98.2 ??F)   10/31/21 36.7 ??C (98 ??F) (Oral)     BP Readings from Last 3 Encounters:   11/29/21 141/94   11/21/21 144/85   10/31/21 142/95     Pulse Readings from Last 3 Encounters:   11/29/21 87   11/21/21 83   10/31/21 69       Objective:        General Appearance: Alert, cooperative, no distress  EYES: EOMI  Ears: small skin lesion/abrasion to Helix of left ear.   CV: Regular rate and rhythm.  RESP: Normal respiratory effort. Clear to auscultation bilaterally without wheezes, rhonchi or crackles.   EXT:  No lower extremity edema. Homan sign negative.  MSK: gait is stable. Strength of Lower extremity is equal and intact. No abnormal patellar or achilles reflexes. Leg cramping was not able to be reproduced on exam.   SKIN: Incision scars on neck.   NEURO: Cranial nerves II- XII grossly intact.  No focal neurologic deficits. Sensation of lower extremities is equal and intact.   PSYCHIATRIC: Alert and oriented x 3. Mood normal.

## 2021-12-01 DIAGNOSIS — R252 Cramp and spasm: Principal | ICD-10-CM

## 2021-12-01 NOTE — Unmapped (Signed)
CK was still 2.5 upper limit of normal but not in Rhabdomyolysis range. Message was sent via MyChart.   I advised for him to increase his water intake and for [place] to recheck his CK and urine in 1-2 weeks. I also advised to not start rosuvastatin yet.       I researched some of his additional medications and Encorafenib and Binimetinib may be contributing to his symptoms (however these medications are important to his treatment plan and should be continued). His calcium was improved but his hypocalcemia may also be contributing to this as well.      Pending repeat labs, will need to discuss again regarding EMG with Nerve conduction study and/or initiation of medications. May also refer to neurology vs rheumatology for further work up and/or muscle biopsy if he wishes.     Muscle cramps  -     CK; Future  -     Urinalysis with Microscopy; Future  -     Vitamin D 25 Hydroxy (25OH D2 + D3); Future  -     Vitamin B12 Level; Future  -     Basic Metabolic Panel; Future

## 2021-12-03 NOTE — Unmapped (Signed)
High priority, please respond within 2 business days.     I am reaching out to [facility] as part of the complex case management team regarding his Complex Case management program enrollement.  Patient reports: His CK levels and wants to know if anything needs to be done to lower the level?    Action item: make recommendations  Initial recommendations: CM will message PCP    Please advise for additional recommendations.    Next appointment with PCP: 03/01/21     See chart for med list if needed    Sherlyn Hay, RN

## 2021-12-03 NOTE — Unmapped (Signed)
COMPLEX CASE MANAGEMENT   FOLLOW UP NOTE  Summary:  Complex Case Manager  spoke with patient and verified correct patient using two identifiers today for Complex Case Management follow up. Patient currently resides at Home. Primary concern is CK level.      Subjective:    Patient/caregiver reported he had CK level results and they are high. Patient has not heard back from PCP what she is going to do about the CK level and how she is going to lower it. CM will message PCP regarding patient's concern and will ask PCP to reach out to patient.Patient reports he has had a sinus infection and also reports he had an iron infusion 1 1/2 weeks ago. Patient states he has had more energy since the  infusion. CM discussed patient's reschedule of RD appt with ECM which is now 12/28/21 at 11:00. Patient is aware of this appointment. CM discussed medication review, but patient had this completed on 12/01/21 and nothing has changed since that date. CM will outreach to patient in 2 weeks or earlier depending on PCP response. Patient voiced understanding.      Objective:     Screenings Completed during visit: None completed at this visit    Barriers to care: Health Literacy    Interventions provided: Medication Reconciliation    Progress towards Medication review goal : Completed 12/03/21    Plan:     1. Case Management to: Follow up in 2 weeks    2. Patient/caregiver to: call PCP if questions/concerns arise    Discuss at next outreach:  lab follow up    Care Coordination Note updated in [facility]: Yes    Future Appointments   Date Time Provider Department Center   12/28/2021 11:00 AM Newkirk, Epimenio Sarin, RD/LDN [employer]  [facility]   01/16/2022 12:00 PM IC CT RM 1 ICTRL[facility] [place] - IC   01/16/2022 12:30 PM IC MRI MBL 2 IMRIRLGH [place] - IC   01/16/2022  2:15 PM ADULT ONC LAB [facility]   01/16/2022  3:00 PM [name], MD ON[facility]   01/16/2022  4:00 PM Ruben Reason, MD [facility]   03/01/2022  9:40 AM Mangel, Benison Pap, DO [facility]   04/10/2022  1:20 PM Reita Cliche, MD [facility]       Problem List         Underactive thyroid      Low calcium levels      Thyroid cancer      Malignant neoplasm of head and neck      Hernia with obstruction      Inflammatory bowel disease (Crohn's disease)      Gout      01/24/2020: Open, primary ventral hernia repair for SBO      Obesity      Nontoxic uninodular goiter      Arthritis of low back      Diverticulitis of intestine      Diverticulosis of colon      Iron deficiency anemia      Suicidal thoughts      Atherosclerotic cardiovascular disease      High cholesterol or triglycerides       Allergies:   Allergies   Allergen Reactions    Compazine [Prochlorperazine] Itching    Coconut Nausea And Vomiting       Medications:  Prior to Admission medications    Medication Dose, Route, Frequency   acetaminophen (TYLENOL) 500  MG tablet 1,000 mg, Oral, Every 8 hours PRN   binimetinib ([name]) 15 mg tablet 45 mg, Oral, 2 times a day (standard)   blood sugar diagnostic Strp Dispense 100 blood glucose test strips, ok to sub any brand preferred by insurance/patient, use 3x/day; dispense whatever brand matches with meter.   blood-glucose meter kit Use as instructed; dispense 1 meter, whatever is preferred by insurance   calcitriol (ROCALTROL) 0.25 MCG capsule 0.25 mcg, Oral, Daily (standard)   calcium carbonate 650 mg calcium (1,625 mg) tablet 1 tablet, Oral, 3 times a day (with meals)   DULoxetine (CYMBALTA) 30 MG capsule 30 mg, Oral, 2 times a day (standard)   encorafenib (BRAFTOVI) 75 mg capsule 450 mg, Oral, Daily (standard)   fexofenadine (ALLEGRA ALLERGY) 180 MG tablet 180 mg, Oral, Daily PRN   folic acid (FOLVITE) 1 MG tablet TAKE 1 TABLET BY MOUTH EVERY DAY   ibuprofen (ADVIL,MOTRIN) 200 MG tablet 600 mg, Oral, Daily PRN   lancets Misc Dispense 100 lancets, ok to sub any brand preferred by insurance/patient, use 3x/day   levothyroxine (SYNTHROID) 125 MCG tablet Take 375 mcg daily; please dispense 90-day supply   memantine (NAMENDA) 10 MG tablet 10 mg, Oral, 2 times a day (standard)   mirtazapine (REMERON) 7.5 MG tablet 7.5 mg, Oral, Nightly   ofloxacin (OCUFLOX) 0.3 % ophthalmic solution Instill 4 drops in left ear twice daily for 5 days   ondansetron (ZOFRAN) 8 MG tablet 1 tablet, Oral, Take as directed   rosuvastatin (CRESTOR) 10 MG tablet 10 mg, Oral, Nightly   sulfaSALAzine (AZULFIDINE) 500 mg tablet 2,000 mg, Oral, 2 times a day          Sherlyn Hay, RN  12/03/2021

## 2021-12-04 NOTE — Unmapped (Signed)
[facility] Refill Coordination Note    Specialty Medication(s) to be Shipped:   Hematology/Oncology: Kenneth Fitzgerald and Kenneth Fitzgerald    Other medication(s) to be shipped: No additional medications requested for fill at this time     Kenneth Fitzgerald, DOB: 11-11-1975  Phone: [phone] (home)       All above HIPAA information was verified with patient.     Was a Nurse, learning disability used for this call? No    Completed refill call assessment today to schedule patient's medication shipment from the [facility] [phone]).  All relevant notes have been reviewed.     Specialty medication(s) and dose(s) confirmed: Regimen is correct and unchanged.   Changes to medications: Kenneth Fitzgerald reports no changes at this time.  Changes to insurance: No  New side effects reported not previously addressed with a pharmacist or physician: None reported  Questions for the pharmacist: No    Confirmed patient received a Conservation officer, historic buildings and a Surveyor, mining with first shipment. The patient will receive a drug information handout for each medication shipped and additional FDA Medication Guides as required.       DISEASE/MEDICATION-SPECIFIC INFORMATION        N/A    SPECIALTY MEDICATION ADHERENCE     Medication Adherence    Patient reported X missed doses in the last month: 0  Specialty Medication: Braftovi 75 mg  Patient is on additional specialty medications: Yes  Additional Specialty Medications: Kenneth Fitzgerald 15mg   Patient Reported Additional Medication X Missed Doses in the Last Month: 0  Patient is on more than two specialty medications: No  Informant: patient                       Were doses missed due to medication being on hold? No    Braftovi 75 mg: 7 days of medicine on hand   Kenneth Fitzgerald 15 mg: 7 days of medicine on hand       REFERRAL TO PHARMACIST     Referral to the pharmacist: Not needed      [facility]     Shipping address confirmed in Epic.     Delivery Scheduled: Yes, Expected medication delivery date: 12/11/21.     Medication will be delivered via UPS to the prescription address in Epic [place].    [name] M [name]   [facility] Specialty Technician

## 2021-12-06 MED ORDER — DULOXETINE 30 MG CAPSULE,DELAYED RELEASE
ORAL_CAPSULE | Freq: Two times a day (BID) | ORAL | 2 refills | 0 days
Start: 2021-12-06 — End: ?

## 2021-12-10 MED FILL — BRAFTOVI 75 MG CAPSULE: ORAL | 30 days supply | Qty: 180 | Fill #1

## 2021-12-10 MED FILL — MEKTOVI 15 MG TABLET: ORAL | 30 days supply | Qty: 180 | Fill #1

## 2021-12-16 MED ORDER — FOLIC ACID 1 MG TABLET
ORAL_TABLET | 0 refills | 0 days
Start: 2021-12-16 — End: ?

## 2021-12-19 MED ORDER — FOLIC ACID 1 MG TABLET
ORAL_TABLET | 2 refills | 0 days | Status: CP
Start: 2021-12-19 — End: ?

## 2021-12-20 NOTE — Unmapped (Signed)
COMPLEX CASE MANAGEMENT   FOLLOW UP NOTE  Summary:  Complex Case Manager  spoke with patient and verified correct patient using two identifiers today for Complex Case Management follow up. Patient currently resides at Home. Primary concern is none expressed.      Subjective:    Patient/caregiver reported he is not doing too bad. Patient states he has been feeling okay. Patient reports he is suppose to follow up locally for labs d/t his elevated CK levels. Patient otherwise is okay. Patient was reminded of RD follow up on 12/28/21. CM will follow up with patient in 4 weeks      Objective:     Screenings Completed during visit: None completed at this visit    Barriers to care: Health Literacy    Interventions provided: Supportive Listening       Plan:     1. Case Management to: Follow up in 4 weeks    2. Patient/caregiver to: call PCP if questions/concerns arise    Discuss at next outreach:  follow up (cancer appt)    Care Coordination Note updated in [facility]: Yes    Future Appointments   Date Time Provider Department Center   12/28/2021 11:00 AM Newkirk, Epimenio Sarin, RD/LDN [employer]  [facility] S[facility]   01/16/2022 12:00 PM IC CT RM 1 ICTRL[facility] [place] - IC   01/16/2022 12:30 PM IC MRI MBL 2 IMRIRL[facility] [place] - IC   01/16/2022  2:15 PM ADULT ONC LAB [facility]   01/16/2022  3:00 PM [name], MD ON[facility]I [facility]   01/16/2022  4:00 PM Ruben Reason, MD [facility]   03/01/2022  9:40 AM Mangel, Benison Pap, DO [facility]   04/10/2022  1:20 PM Reita Cliche, MD [facility]       Problem List         Underactive thyroid      Low calcium levels      Thyroid cancer      Malignant neoplasm of head and neck      Hernia with obstruction      Inflammatory bowel disease (Crohn's disease)      Gout      01/24/2020: Open, primary ventral hernia repair for SBO      Obesity      Nontoxic uninodular goiter      Arthritis of low back      Diverticulitis of intestine      Diverticulosis of colon      Iron deficiency anemia      Suicidal thoughts      Atherosclerotic cardiovascular disease      High cholesterol or triglycerides       Allergies:   Allergies   Allergen Reactions    Compazine [Prochlorperazine] Itching    Coconut Nausea And Vomiting       Medications:  Prior to Admission medications    Medication Dose, Route, Frequency   acetaminophen (TYLENOL) 500 MG tablet 1,000 mg, Oral, Every 8 hours PRN   binimetinib ([name]) 15 mg tablet 45 mg, Oral, 2 times a day (standard)   blood sugar diagnostic Strp Dispense 100 blood glucose test strips, ok to sub any brand preferred by insurance/patient, use 3x/day; dispense whatever brand matches with meter.   blood-glucose meter kit Use as instructed; dispense 1 meter, whatever is preferred by insurance   calcitriol (ROCALTROL) 0.25 MCG capsule 0.25 mcg, Oral, Daily (standard)   calcium carbonate 650 mg calcium (1,625 mg) tablet 1 tablet,  Oral, 3 times a day (with meals)   DULoxetine (CYMBALTA) 30 MG capsule 30 mg, Oral, 2 times a day (standard)   encorafenib (BRAFTOVI) 75 mg capsule 450 mg, Oral, Daily (standard)   fexofenadine (ALLEGRA ALLERGY) 180 MG tablet 180 mg, Oral, Daily PRN   folic acid (FOLVITE) 1 MG tablet TAKE 1 TABLET BY MOUTH EVERY DAY   ibuprofen (ADVIL,MOTRIN) 200 MG tablet 600 mg, Oral, Daily PRN   lancets Misc Dispense 100 lancets, ok to sub any brand preferred by insurance/patient, use 3x/day   levothyroxine (SYNTHROID) 125 MCG tablet Take 375 mcg daily; please dispense 90-day supply   memantine (NAMENDA) 10 MG tablet 10 mg, Oral, 2 times a day (standard)   mirtazapine (REMERON) 7.5 MG tablet 7.5 mg, Oral, Nightly   ofloxacin (OCUFLOX) 0.3 % ophthalmic solution Instill 4 drops in left ear twice daily for 5 days   ondansetron (ZOFRAN) 8 MG tablet 1 tablet, Oral, Take as directed   rosuvastatin (CRESTOR) 10 MG tablet 10 mg, Oral, Nightly   sulfaSALAzine (AZULFIDINE) 500 mg tablet 2,000 mg, Oral, 2 times a day          Sherlyn Hay, RN  12/20/2021

## 2021-12-27 NOTE — Unmapped (Signed)
[facility] Refill Coordination Note    Specialty Medication(s) to be Shipped:   Hematology/Oncology: [name] and [name]    Other medication(s) to be shipped: No additional medications requested for fill at this time     Kenneth Fitzgerald, DOB: 01-02-1976  Phone: [phone] (home)       All above HIPAA information was verified with patient.     Was a Nurse, learning disability used for this call? No    Completed refill call assessment today to schedule patient's medication shipment from the [facility] [phone]).  All relevant notes have been reviewed.     Specialty medication(s) and dose(s) confirmed: Regimen is correct and unchanged.   Changes to medications: Riyaz reports no changes at this time.  Changes to insurance: No  New side effects reported not previously addressed with a pharmacist or physician: None reported  Questions for the pharmacist: No    Confirmed patient received a Conservation officer, historic buildings and a Surveyor, mining with first shipment. The patient will receive a drug information handout for each medication shipped and additional FDA Medication Guides as required.       DISEASE/MEDICATION-SPECIFIC INFORMATION        N/A    SPECIALTY MEDICATION ADHERENCE     Medication Adherence    Patient reported X missed doses in the last month: 0  Specialty Medication: Braftovi 75 mg  Patient is on additional specialty medications: Yes  Additional Specialty Medications: [name] 15mg   Patient Reported Additional Medication X Missed Doses in the Last Month: 0  Patient is on more than two specialty medications: No  Informant: patient                       Were doses missed due to medication being on hold? No    Braftovi 75 mg: 14 days of medicine on hand   [name] 15 mg: 14 days of medicine on hand       REFERRAL TO PHARMACIST     Referral to the pharmacist: Not needed      [facility]     Shipping address confirmed in Epic.     Delivery Scheduled: Yes, Expected medication delivery date: 01/09/22.     Medication will be delivered via UPS to the prescription address in Epic [place].    [name] M [name]   [facility] Specialty Technician

## 2021-12-28 ENCOUNTER — Telehealth: Admit: 2021-12-28 | Discharge: 2021-12-29 | Payer: PRIVATE HEALTH INSURANCE

## 2021-12-28 DIAGNOSIS — E669 Obesity, unspecified: Principal | ICD-10-CM

## 2021-12-28 DIAGNOSIS — Z713 Dietary counseling and surveillance: Principal | ICD-10-CM

## 2021-12-28 DIAGNOSIS — Z6841 Body Mass Index (BMI) 40.0 and over, adult: Principal | ICD-10-CM

## 2021-12-28 NOTE — Unmapped (Addendum)
Work to gradually increase intake by adding an additional bottle of water.  Aim to have at least 1 vegetable with each dinner.  With breakfast, add one serving of fruit.   Before next RD visit, track intakes for at least 2 weeks.

## 2021-12-28 NOTE — Unmapped (Unsigned)
Nutrition Consult Note      PCP: Mangel, Benison Pap, DO  Address on File: [address], [place] [address], [place]      Verified as Current Location: No, Current Location: at work, Armed forces operational officer, [place]  Extended Emergency Contact Information  Primary Emergency Contact: [name]  Mobile Phone: [phone]  Relation: Sister  Preferred language: ENGLISH  Interpreter needed? No  Secondary Emergency Contact: Mecham,Jay   [place]  Home Phone: [phone]  Relation: Brother     Verified Behavioral Health Emergency Contact: Primary      The patient reports they are physically located in [place] and is currently: not at home. I conducted a audio/video visit. I spent  7m 12s on the video call with the patient. I spent an additional 15 minutes on pre- and post-visit activities on the date of service .      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)    Malignant neoplasm of thyroid gland (CMS-HCC)    Postoperative hypothyroidism    Hypocalcemia    Thyroid cancer (CMS-HCC)    Primary melanoma of head and neck (CMS-HCC)    Ventral hernia with bowel obstruction    Insomnia    Crohn's disease of large intestine without complication (CMS-HCC)    Malignant neoplasm of brain, unspecified location (CMS-HCC)    Gout    01/24/2020: Open, primary ventral hernia repair for SBO    Morbid obesity with BMI of 40.0-44.9, adult (CMS-HCC)    Nontoxic uninodular goiter    MDD (major depressive disorder), recurrent episode, severe (CMS-HCC)    Lumbosacral spondylosis without myelopathy    Diverticulitis of colon (without mention of hemorrhage)(562.11)    Depressive disorder    Diverticulosis of colon    Iron deficiency anemia    Suicidal ideation    Atherosclerotic cardiovascular disease    HLD (hyperlipidemia)       Present height    Present weight    Current BMI      Weight Status Categories:  Underweight:  BMI < 18.5  Normal Weight:  BMI 18.5 - 24.9  Overweight:  BMI 25 - 29.9  Obesity Class I:  BMI 30 - 34.9  Obesity Class II:  BMI 35 - 39.9  Obesity Class III:  BMI ? 40        Weight History:  Wt Readings from Last 6 Encounters:   11/29/21 (!) 147.7 kg (325 lb 11.2 oz)   11/21/21 (!) 148.9 kg (328 lb 4.2 oz)   10/31/21 (!) 148.1 kg (326 lb 9.6 oz)   10/18/21 (!) 149.2 kg (329 lb)   10/10/21 (!) 147.2 kg (324 lb 9.6 oz)   08/29/21 (!) 147.4 kg (325 lb)       Allergies:   Allergies   Allergen Reactions    Compazine [Prochlorperazine] Itching    Coconut Nausea And Vomiting       Relevant Medications, Herbs, Supplements:    Current Outpatient Medications:     acetaminophen (TYLENOL) 500 MG tablet, Take 2 tablets (1,000 mg total) by mouth every eight (8) hours as needed for pain., Disp: 30 tablet, Rfl: 0    binimetinib ([name]) 15 mg tablet, Take 3 tablets (45 mg total) by mouth two (2) times a day., Disp: 180  tablet, Rfl: 11    blood sugar diagnostic Strp, Dispense 100 blood glucose test strips, ok to sub any brand preferred by insurance/patient, use 3x/day; dispense whatever brand matches with meter., Disp: 100 strip, Rfl: 12    blood-glucose meter kit, Use as instructed; dispense 1 meter, whatever is preferred by insurance, Disp: 1 each, Rfl: 1    calcitriol (ROCALTROL) 0.25 MCG capsule, Take 1 capsule (0.25 mcg total) by mouth daily., Disp: 90 capsule, Rfl: 3    calcium carbonate 650 mg calcium (1,625 mg) tablet, Take 1 tablet (650 mg of elem calcium total) by mouth Three (3) times a day with a meal., Disp: , Rfl:     DULoxetine (CYMBALTA) 30 MG capsule, Take 1 capsule (30 mg total) by mouth two (2) times a day., Disp: 180 capsule, Rfl: 2    encorafenib (BRAFTOVI) 75 mg capsule, Take 6 capsules (450 mg total) by mouth daily., Disp: 180 capsule, Rfl: 11    fexofenadine (ALLEGRA ALLERGY) 180 MG tablet, Take 1 tablet (180 mg total) by mouth daily as needed (allergies)., Disp: , Rfl:     folic acid (FOLVITE) 1 MG tablet, TAKE 1 TABLET BY MOUTH EVERY DAY, Disp: 90 tablet, Rfl: 2    ibuprofen (ADVIL,MOTRIN) 200 MG tablet, Take 3 tablets (600 mg total) by mouth daily as needed for pain., Disp: , Rfl:     lancets Misc, Dispense 100 lancets, ok to sub any brand preferred by insurance/patient, use 3x/day, Disp: 100 each, Rfl: 12    levothyroxine (SYNTHROID) 125 MCG tablet, Take 375 mcg daily; please dispense 90-day supply, Disp: 270 tablet, Rfl: 3    memantine (NAMENDA) 10 MG tablet, Take 1 tablet (10 mg total) by mouth Two (2) times a day., Disp: 60 tablet, Rfl: 6    mirtazapine (REMERON) 7.5 MG tablet, Take 1 tablet (7.5 mg total) by mouth nightly., Disp: 90 tablet, Rfl: 3    ofloxacin (OCUFLOX) 0.3 % ophthalmic solution, Instill 4 drops in left ear twice daily for 5 days, Disp: 5 mL, Rfl: 3    ondansetron (ZOFRAN) 8 MG tablet, Take 1 tablet (8 mg total) by mouth Take as directed., Disp: , Rfl:     rosuvastatin (CRESTOR) 10 MG tablet, Take 1 tablet (10 mg total) by mouth nightly., Disp: 90 tablet, Rfl: 0    sulfaSALAzine (AZULFIDINE) 500 mg tablet, Take 4 tablets (2,000 mg total) by mouth two (2) times a day., Disp: 240 tablet, Rfl: 11    Do you have any concerns about taking or affording your medications? If Yes, consider [facility] referral ([id number])  No concerns    Relevant Labs:  HGB A1C, POC (%)   Date Value   08/30/2019 5.4     Hemoglobin A1C (%)   Date Value   05/10/2020 5.4   01/03/2020 5.8 (H)   06/22/2018 6.0 (H)   09/22/2012 5.2      No components found for: LDLCALC   BP Readings from Last 3 Encounters:   11/29/21 141/94   11/21/21 144/85   10/31/21 142/95     Lab Results   Component Value Date    CHOL 254 (H) 10/31/2021     Lab Results   Component Value Date    HDL 37 (L) 10/31/2021     Lab Results   Component Value Date    LDL 161 (H) 10/31/2021     Lab Results   Component Value Date    VLDL 56 (H) 10/31/2021     Lab Results  Component Value Date    CHOLHDLRATIO 6.9 (H) 10/31/2021     Lab Results   Component Value Date    TRIG 280 (H) 10/31/2021    TRIG 200 (H) 01/27/2020       Lab Results   Component Value Date    VITDTOTAL 31.0 02/27/2017     No results found for: [id number]      Physical Activity:  Type: weightlifting   Some limited activity- notes medication side effect of joint inflammation    24-Hour recall/usual intake:    Time Intake   Breakfast Frozen biscuit  OR eggs with bacon with 16oz milk (1% choc + 1% reg mixture)   Snack (AM)    Lunch PB& J sandwich OR 1 hot pocket  Yesterday- 1/2 slice of pizza   Snack (PM)    Dinner [employer] OR takeout (divides into 2-3 meals)  Last night- 1/2 slice of pizza     Snack (HS) With medicine at night- 4-5 oz milk (1% choc + 1% reg mixture)     Other Nutrition Information:  PATIENT STATED FOCUS: pt states he had thyroid removed 9 years ago and has been unable to lose weight; in the last 6 months has gained 5-10 lbs;  In the past has worked on portion control, decreasing calories and increased exercise; notes no sustainable weight loss  -does not check weight at home; usually at doc appts; states clothes are fitting looser since last month  -since Jan has removed regular soda- is having zero sugar soda    Reviewed usual intakes and activity patterns. Answered questions from pt.  Supportive listening provided and motivational interviewing conducted.      HISTORY   Cancer- stage 4 melanoma; previous stage 3 thyroid; many recurrances- brain chest, abdomen; taking many medications; last radiation treatment Jan 2022  Calcium deficiency- drinks lot of milk        MEAL PATTERN   3 meals    USUAL INTAKES  Dairy: 1% milk, Greek yogurt, cottage cheese, and cheese  Fruit: <1 serving per day 1-2 servings per week  Vegetables: <1 serving per day notes recent decreases  Starches: cereal (froot loops, frosted flakes, rice krispies, honey nut cheerios), bread (white and whole wheat), pasta (white), and rice (white)  Protein: chicken/poultry, beef, fish, eggs, nut butters, and beans  Snacks / Sweets: varies  Oils / Fats: olive oil and canola oil  Sodium: does not add salt to foods and frequent intake of fast food/processed foods        PORTIONS    Pt reports they have improved the last few months; when eating out takes half home      BEVERAGES   milk (1% choc + 1% reg mixture)  Water- 24-32oz  Zero sugar soda- no more than 1 bottle per day; not daily; recently reduced intakes     COOKING   Lives alone, likes cooking when has time     DINING OUT   1-2 times per week     Alcohol: 1 drink per month  Tobacco: none         Food Insecurity: No Food Insecurity (11/05/2021)    Hunger Vital Sign     Worried About Running Out of Food in the Last Year: Never true     Ran Out of Food in the Last Year: Never true          Estimated Daily Needs:  2400 calories (Mifflin-St Jeor equation for BMI > 25 using actual  body weight, adjusted for weight loss)  118-147 g protein      Nutrition Diagnosis:  Overweight/obesity related to decreased energy expenditure due to sedentary lifestyle as evidenced by pt report, BMI > 30    Nutrition Intervention:  Nutrition Counseling    Materials Provided:  none    Social Determinants of Health interventions provided: N/A - no concerns identified.     Patient Stated Nutrition Goals:  Work to gradually increase water intake by adding an additional bottle of water.  Aim to have at least 1 vegetable with each dinner.  With breakfast, add one serving of fruit.   Before next RD visit, track intakes for at least 2 weeks.  Goals Added to Visit Navigator?  Yes    Monitoring/Evaluation:  Progress towards goals:  baseline    Follow-up:  5 weeks-02/01/2022    Length of visit was 35 minutes  2 unit(s) billed per insurance    Carla Drape, MPH, RD, LDN goals:[id number]}    Follow-up:  ***    Length of visit was *** minutes  *** unit(s) billed per insurance    Carla Drape, MPH, RD, LDN

## 2022-01-08 MED FILL — MEKTOVI 15 MG TABLET: ORAL | 30 days supply | Qty: 180 | Fill #2

## 2022-01-08 MED FILL — BRAFTOVI 75 MG CAPSULE: ORAL | 30 days supply | Qty: 180 | Fill #2

## 2022-01-12 NOTE — Unmapped (Unsigned)
Radiation Oncology Follow Up Visit Note    Patient Name: Kenneth Fitzgerald [facility]  Patient Age: 46 y.o.  Encounter Date: 01/16/2022    Referring Physician:   [name], MD  [address]  [id number] [address]  [place],  [place] [address]    Primary Care Provider:  Mangel, Benison Pap, DO    Diagnoses:  No diagnosis found.      Treatment Site: whole brain to 30Gy completed 01/20/20  Right neck to 48 Gy completed 07/09/13    Interval Since Completion of Treatment: ~2 years since WBRT      Assessment: Kenneth Fitzgerald is a 46 y.o. 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    He originally had a Stage III scalp melanoma in 2012, pT2pN2, s/p WLE and 3/3 positive SLN's, completion right level 2 neck dissection on 11/06/10 (0/25), one year of interferon completed 01/2012, and then right neck dissection 03/29/13 showing 1+/20 nodes (1cm with no ECE).     He also has multiply recurrent thyroid cancer in the same right neck dissection and is s/p RAI on 05/21/13.  Completed post-op EBRT to right neck to 48 Gy 07/09/13.    He has had stable extracranial disease on dabrafenib/trametinib and was noted to have multiple punctate lesions (best seen on T1 noncontrast sequence) in the brain suspicious for metastasis that have been increasing in number since January 2020.  He completed WBRT to 30 Gy on 01/20/20.    *** today was personally reviewed and reviewed and discussed with Dr ***, demonstrating ***      Plan:     -Disease Status: intracranial control, NED chest on scans today; Stable in Ab/Pelv     -Care Plan: systemic therapy dabrafenib/trametinib per Dr Nedra Hai     -Symptoms management: follow up with Dr Ralene Ok in ENT 05/16/20    -Continuing memantine since tolerating well and feels that memory has improved.     -Follow-up: 3 months with MRI brain (coordinated with Dr. Nedra Hai)      Interval History:  Kenneth Fitzgerald returns today for a regularly scheduled follow-up.    Today he reports ***    Review of Systems: All other systems reviewed are negative. Pertinent positives and negatives are above in interval history. Past Medical, Surgical, Family and Social Histories reviewed and updated in the electronic medical record.      Oncology History   Primary melanoma of head and neck (CMS-HCC)   03/17/2013 Initial Diagnosis    Melanoma (CMS-HCC)     02/12/2018 - 03/12/2018 Chemotherapy    OP NIVOLUMAB 480 MG Q4W  nivolumab 480 mg every 28 days     05/22/2021 -  Cancer Staged    Staging form: Melanoma of the Skin, AJCC 7th Edition  - Clinical: Stage IV (M1c) - Signed by [name], MD on 05/22/2021       Malignant neoplasm of brain, unspecified location (CMS-HCC) (Resolved)   12/22/2019 Initial Diagnosis    Malignant neoplasm of brain, unspecified location (CMS-HCC)     12/22/2019 -  Radiation    Radiation Therapy Treatment Details (Noted on 12/22/2019)  Site: Brain  Technique: IMRT  Goal: No goal specified  Planned Treatment Start Date: No planned start date specified           Physical Exam:  There were no vitals filed for this visit.    ECOG: 90,  Able to carry on normal activity;  minor signs or symptoms of disease (ECOG equivalent 0)  General/Constitutional: Well-appearing, NAD   HEENT: Normocephalic, atraumatic, no scleral icterus, missing teeth in back of mouth   Skin: No suspicious lesions or rashes  Pulmonary: No respiratory distress or increased work of breathing   Musculoskeletal: Full range of motion in the extremities, without edema   Neurologic: Alert and oriented to conversation.  CN II-XII grossly intact. 5/5 strength throughout the upper and lower extremities.   Psychiatric: Appropriate affect and judgement        Radiology  No results found.       ***

## 2022-01-16 ENCOUNTER — Ambulatory Visit
Admit: 2022-01-16 | Discharge: 2022-01-16 | Payer: PRIVATE HEALTH INSURANCE | Attending: Hematology & Oncology | Primary: Hematology & Oncology

## 2022-01-16 ENCOUNTER — Ambulatory Visit
Admit: 2022-01-16 | Discharge: 2022-01-17 | Payer: PRIVATE HEALTH INSURANCE | Attending: Radiation Oncology | Primary: Radiation Oncology

## 2022-01-16 ENCOUNTER — Ambulatory Visit: Admit: 2022-01-16 | Discharge: 2022-01-16 | Payer: PRIVATE HEALTH INSURANCE

## 2022-01-16 ENCOUNTER — Other Ambulatory Visit: Admit: 2022-01-16 | Discharge: 2022-01-16 | Payer: PRIVATE HEALTH INSURANCE

## 2022-01-16 DIAGNOSIS — C439 Malignant melanoma of skin, unspecified: Principal | ICD-10-CM

## 2022-01-16 DIAGNOSIS — Z0000 Encounter for general adult medical examination without abnormal findings: Principal | ICD-10-CM

## 2022-01-16 DIAGNOSIS — R252 Cramp and spasm: Principal | ICD-10-CM

## 2022-01-16 LAB — COMPREHENSIVE METABOLIC PANEL
ALBUMIN: 4.1 g/dL (ref 3.4–5.0)
ALKALINE PHOSPHATASE: 114 U/L (ref 46–116)
ALT (SGPT): 46 U/L (ref 10–49)
ANION GAP: 8 mmol/L (ref 5–14)
AST (SGOT): 33 U/L (ref <=34)
BILIRUBIN TOTAL: 0.3 mg/dL (ref 0.3–1.2)
BLOOD UREA NITROGEN: 16 mg/dL (ref 9–23)
BUN / CREAT RATIO: 16
CALCIUM: 9 mg/dL (ref 8.7–10.4)
CHLORIDE: 105 mmol/L (ref 98–107)
CO2: 24 mmol/L (ref 20.0–31.0)
CREATININE: 1.02 mg/dL
EGFR CKD-EPI (2021) MALE: >90 mL/min/{1.73_m2} (ref >=60)
GLUCOSE RANDOM: 104 mg/dL (ref 70–179)
POTASSIUM: 3.8 mmol/L (ref 3.4–4.8)
PROTEIN TOTAL: 7.6 g/dL (ref 5.7–8.2)
SODIUM: 137 mmol/L (ref 135–145)

## 2022-01-16 LAB — URINALYSIS WITH MICROSCOPY
BACTERIA: NONE SEEN /HPF
BILIRUBIN UA: NEGATIVE
BLOOD UA: NEGATIVE
GLUCOSE UA: NEGATIVE
KETONES UA: NEGATIVE
LEUKOCYTE ESTERASE UA: NEGATIVE
NITRITE UA: NEGATIVE
PH UA: 5.5 (ref 5.0–9.0)
PROTEIN UA: NEGATIVE
RBC UA: 2 /HPF (ref <=3)
SPECIFIC GRAVITY UA: 1.042 — ABNORMAL HIGH (ref 1.003–1.030)
SQUAMOUS EPITHELIAL: <1 /HPF (ref 0–5)
UROBILINOGEN UA: <2
WBC UA: 1 /HPF (ref <=2)

## 2022-01-16 LAB — CBC W/ AUTO DIFF
BASOPHILS ABSOLUTE COUNT: 0.1 10*9/L (ref 0.0–0.1)
BASOPHILS RELATIVE PERCENT: 0.7 %
EOSINOPHILS ABSOLUTE COUNT: 0.3 10*9/L (ref 0.0–0.5)
EOSINOPHILS RELATIVE PERCENT: 3.9 %
HEMATOCRIT: 41.9 % (ref 39.0–48.0)
HEMOGLOBIN: 14 g/dL (ref 12.9–16.5)
LYMPHOCYTES ABSOLUTE COUNT: 1.4 10*9/L (ref 1.1–3.6)
LYMPHOCYTES RELATIVE PERCENT: 17.9 %
MEAN CORPUSCULAR HEMOGLOBIN CONC: 33.3 g/dL (ref 32.0–36.0)
MEAN CORPUSCULAR HEMOGLOBIN: 27.4 pg (ref 25.9–32.4)
MEAN CORPUSCULAR VOLUME: 82.1 fL (ref 77.6–95.7)
MEAN PLATELET VOLUME: 8.9 fL (ref 6.8–10.7)
MONOCYTES ABSOLUTE COUNT: 0.5 10*9/L (ref 0.3–0.8)
MONOCYTES RELATIVE PERCENT: 6.1 %
NEUTROPHILS ABSOLUTE COUNT: 5.6 10*9/L (ref 1.8–7.8)
NEUTROPHILS RELATIVE PERCENT: 71.4 %
PLATELET COUNT: 236 10*9/L (ref 150–450)
RED BLOOD CELL COUNT: 5.1 10*12/L (ref 4.26–5.60)
RED CELL DISTRIBUTION WIDTH: 21.7 % — ABNORMAL HIGH (ref 12.2–15.2)
WBC ADJUSTED: 7.9 10*9/L (ref 3.6–11.2)

## 2022-01-16 LAB — BASIC METABOLIC PANEL
ANION GAP: 10 mmol/L (ref 5–14)
BLOOD UREA NITROGEN: 14 mg/dL (ref 9–23)
BUN / CREAT RATIO: 14
CALCIUM: 8.7 mg/dL (ref 8.7–10.4)
CHLORIDE: 105 mmol/L (ref 98–107)
CO2: 23 mmol/L (ref 20.0–31.0)
CREATININE: 1.03 mg/dL
EGFR CKD-EPI (2021) MALE: >90 mL/min/{1.73_m2} (ref >=60)
GLUCOSE RANDOM: 102 mg/dL (ref 70–179)
POTASSIUM: 3.9 mmol/L (ref 3.4–4.8)
SODIUM: 138 mmol/L (ref 135–145)

## 2022-01-16 LAB — VITAMIN B12: VITAMIN B-12: 509 pg/mL (ref 211–911)

## 2022-01-16 LAB — CK: CREATINE KINASE TOTAL: 382 U/L — ABNORMAL HIGH

## 2022-01-16 MED ADMIN — gadobenate dimeglumine (MULTIHANCE) 529 mg/mL (0.1mmol/0.2mL) solution 20 mL: 20 mL | INTRAVENOUS | @ 20:00:00 | Stop: 2022-01-16

## 2022-01-16 MED ADMIN — iohexol (OMNIPAQUE) 350 mg iodine/mL solution 75 mL: 75 mL | INTRAVENOUS | @ 17:00:00 | Stop: 2022-01-16

## 2022-01-16 NOTE — Unmapped (Addendum)
PRIMARY CARE PHYSICIAN  Mangel, Benison Pap, DO  [address]  [place] [address]    CONSULTING PHYSICIANS  Lendell Caprice Concrete, [place]  [address]  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   -S/p thyroidectomy 06/2011 and radioactive iodine 03/2012 and again 05/2013.    -Thyroid CA recurrence 11/2012 s/p L neck dissecion 12/07/2012.  -2nd thyroid CA recurrence 03/2013 AND co-existing 1st melanoma recurrence 03/2013 s/p R -cervical level 2-4 node dissection and partial parotidectomy  -S/p radiation to R neck, completed 06/2013  -Nivolumab (02/12/18 x 2 cycles) complicated by colitis that required a 6 week steroid taper.   -s/p WBRT, completed 01/20/20    CURRENT THERAPY:   BRAF/MEK started early May 2020    ASSESSMENT: [name] is a pleasant 47 y.o. male who presents today for further evaluation of recurrent stage IV melanoma. 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    PLAN  1. Stage IV melanoma: BRAF+ by IHC. Dr Eyvonne Left reviewed path: TILS present, non-brisk; lymphocytes are 20 to 30% the number of melanoma cells.  Known sites of disease include abdominal/pelvic LN, and brain. Started dabrafenib/trametinib mid-May 2020, has had stable disease extracranially.      - Reviewed path from hernia surgery in Jan 2022, noted that the abdominal wall/peritoneal implants contained melanophages, but no melanoma cells.  We discussed that this is interesting, good to know that there wasn't melanoma in the biopsy, is in keeping w/immune response.    - He is s/p WBRT, completed 01/20/20.   - Personally reviewed CT chest, MRI brain, abdomen today. There is NED in chest/abdomen.  Brain MRI stable.  - He is back on BRAF/MEK targeted therapy.  Will continue.   - Clinically, he is doing well.  -RTC in 12 weeks for clinical follow up and repeat imaging.  -Of note, we have attempted to confirm BRAF on NGS twice, on original primary specimen from July 2012 and and pelvic LN sample from 01/09/2018.  Both samples were insufficient tissue for analysis.  At this point, BRAF is known by IHC only.     #Elevated CK;   -possibly related to encorafenib/binimetinib, also noted previously when he received adjuvant INF years ago.  -CK is trending down (currently 2.2x/ULN), encouraged hydration.   -PI notes holding drug iv 10X ULN, so will repeat if muscle cramping worsens.    #Fe deficiency anemia; likely GI source in setting of Crohn's   - feels better s/p 1 dose of IV iron 11/2021    #.) Brain mets; s/p WBRT as per above.  Brain MRI from today reviewed and stable. Rad/onc following.    #.) health maintenance/CAD on CT chest  --follows w/PCP for ongoing care    #) L ear fullness, hearing loss; ENT following.  Is better s/p PE tube placement, since fallen out.     #.) LBP, radiculopathy, MRI abdomen 01/03/21 findings w/disc bulge   - imaging shows improvement in the L sided low back findings and pt reports symptom improvement.    #.) Atypical Crohn's; recent colonoscopy 08/29/21 w/severe colitis, moderate proctitis.   -- Cont increased sulfasalazine per GI medicine.    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 During hospital stay, it was determined that  this was malignant thrombus and no AC recommended.  Will follow.     #.) Hernia repair; all scars healed and he is eating and drinking normally.  Surgeon aware abdominal wall finding, CTM.     #.) Thyroid CA, recurrent. Well controlled now. Followed by endocrinology  --TSH/FT4 repeated today now the he is back on synthroid/hasn't missed doses  --will alert endocrinology    #.) Mood; excessive stress related to h/o 2 malignancies, job loss and legal issues surrounding, parents both passed away in recent years.   He follows w/ Drs. Yopp and Park.     #.) HypoCA; he struggles w/compliance during the workday,cont Ca supplementation    #.) Supportive care/GOC;appreciate pall care support and med titration. Discussed w/[name]; OK to pause pall care for now given well controlled disease and well controlled symptoms.      INTERVAL HISTORY: This is a return visit to the [facility] for further evaluation of melanoma.  Got a new yellow lab- exciting!  Notes intermittent joint pains, variable R knee, L shoulder, etc.  Hasn't missed doses of BRAF/MEK inhibitors.  PCP suggested vaccinations, declined  Received 1 dose of IV iron dextran on 11/8; no reaction  Energy improved  Continues on 2pills BID sulfasalazine, this has helped w/rectal bleeding.   Feels well otherwise, is working full time.    PE tube is out, but hearing well.    No new CNS symptoms.     Completed PT for neck lymphedema, this is helping some.  Follows w/Dr Yopp.      PAST MEDICAL HISTORY:   1. Diverticulitis.   2. History of ankylosing spondylitis in 2009. Please see past medical history dictated on 11/15/2010 for details. In brief, he was treated w/Enbrel and prednisone for 6mos or so.  Symptoms improved, immunosuppressants were stopped and symptoms did not recur.   3. S/p incisional hernia repair       SOCIAL HISTORY:    reports that he has never smoked. He has never used smokeless tobacco. He reports current alcohol use. He reports that he does not use drugs.      MEDICATIONS:   Current Outpatient Medications   Medication Sig Dispense Refill    acetaminophen (TYLENOL) 500 MG tablet Take 2 tablets (1,000 mg total) by mouth every eight (8) hours as needed for pain. 30 tablet 0    binimetinib ([name]) 15 mg tablet Take 3 tablets (45 mg total) by mouth two (2) times a day. 180 tablet 11    blood sugar diagnostic Strp Dispense 100 blood glucose test strips, ok to sub any brand preferred by insurance/patient, use 3x/day; dispense whatever brand matches with meter. 100 strip 12    blood-glucose meter kit Use as instructed; dispense 1 meter, whatever is preferred by insurance 1 each 1    calcitriol (ROCALTROL) 0.25 MCG capsule Take 1 capsule (0.25 mcg total) by mouth daily. 90 capsule 3    calcium carbonate 650 mg calcium (1,625 mg) tablet Take 1 tablet (650 mg of elem calcium total) by mouth Three (3) times a day with a meal.      DULoxetine (CYMBALTA) 30 MG capsule Take 1 capsule (30 mg total) by mouth two (2) times a day. 180 capsule 2    encorafenib (BRAFTOVI) 75 mg capsule Take 6 capsules (450 mg total) by mouth daily. 180 capsule 11    fexofenadine (ALLEGRA ALLERGY) 180 MG tablet Take 1 tablet (180 mg total) by mouth daily as  needed (allergies).      fluticasone propionate (FLONASE) 50 mcg/actuation nasal spray 1 spray into each nostril daily.      folic acid (FOLVITE) 1 MG tablet TAKE 1 TABLET BY MOUTH EVERY DAY 90 tablet 2    guaiFENesin (MUCINEX) 600 mg 12 hr tablet Take 1 tablet (600 mg total) by mouth every twelve (12) hours.      ibuprofen (ADVIL,MOTRIN) 200 MG tablet Take 3 tablets (600 mg total) by mouth daily as needed for pain.      lancets Misc Dispense 100 lancets, ok to sub any brand preferred by insurance/patient, use 3x/day 100 each 12    levothyroxine (SYNTHROID) 125 MCG tablet Take 375 mcg daily; please dispense 90-day supply 270 tablet 3    memantine (NAMENDA) 10 MG tablet Take 1 tablet (10 mg total) by mouth Two (2) times a day. 60 tablet 6    mirtazapine (REMERON) 7.5 MG tablet Take 1 tablet (7.5 mg total) by mouth nightly. 90 tablet 3    ofloxacin (OCUFLOX) 0.3 % ophthalmic solution Instill 4 drops in left ear twice daily for 5 days 5 mL 3    ondansetron (ZOFRAN) 8 MG tablet Take 1 tablet (8 mg total) by mouth Take as directed.      rosuvastatin (CRESTOR) 10 MG tablet Take 1 tablet (10 mg total) by mouth nightly. 90 tablet 0    sulfaSALAzine (AZULFIDINE) 500 mg tablet Take 4 tablets (2,000 mg total) by mouth two (2) times a day. 240 tablet 11     No current facility-administered medications for this visit.       REVIEW OF SYSTEMS  A complete review of systems was obtained and was negative except for those symptoms listed in the HPI     PHYSICAL EXAM  Vitals:    01/16/22 1547   BP: 136/91   Pulse: 103   Temp: 36.8 ??C (98.3 ??F)   SpO2: 97%             GEN: Awake and alert, pleasant appearing male in no acute distress  LUNGS: normal WOB, CTAB  CV: NAD, S1S2  ABD; obese, soft NTND  SKIN: No rashes, petechiae or jaundice noted.  All melanoma scarlines examined; no nodularity.    PYSCH: Alert and oriented to person, place and time  EXT: No edema noted of the lower extremity noted.          LABS  Lab Results   Component Value Date    WBC 7.9 01/16/2022    HGB 14.0 01/16/2022    HCT 41.9 01/16/2022    PLT 236 01/16/2022       Lab Results   Component Value Date    NA 137 01/16/2022    K 3.8 01/16/2022    CL 105 01/16/2022    CO2 24.0 01/16/2022    BUN 16 01/16/2022    CREATININE 1.02 01/16/2022    GLU 104 01/16/2022    CALCIUM 9.0 01/16/2022    MG 1.7 10/18/2021    PHOS 4.3 10/31/2021       Lab Results   Component Value Date    BILITOT 0.3 01/16/2022    BILIDIR 0.20 01/27/2020    PROT 7.6 01/16/2022    ALBUMIN 4.1 01/16/2022    ALT 46 01/16/2022    AST 33 01/16/2022    ALKPHOS 114 01/16/2022    GGT 42 08/22/2011       Lab Results   Component Value Date    INR 1.12 01/24/2020  RADIOLOGY RESULTS    01/16/21:  Impression   Innumerable micrometastases throughout both the supratentorial and infratentorial brain compartments, unchanged since 10/18/2021       CT chest 01/16/22:  Impression      No thoracic metastatic disease.      Soft tissue implants along the inferior right liver lobe are similar to minimally larger in size compared to 10/18/2020.              MRI abdomen 10/18/21:  Impression   Compared to prior examinations dating back to 06/21/2020, stable size of soft tissue implants along the hepatic capsule of segment 5. No new sites of metastatic disease identified within the abdomen.      Persistent mild splenomegaly.            Brain MRI 07/05/21:  Impression   Unchanged innumerable micrometastases throughout the supratentorial and infratentorial brain.       Colonoscopy 08/29/21:  Impression:            - Patent functional end-to-end colo-colonic                          anastomosis, characterized by healthy appearing mucosa.                         - Friability and scarring with contact bleeding at the                          ileocecal valve. Biopsied.                         - Pseudopolyps in the transverse colon. Biopsied.                         - Diverticulosis in the entire examined colon.                         - Erythematous, granular, edematous mucosa in the                          rectum. This is worse compared wtih prior and appears                          consistent with mildlly active Crohn's colitis.                          Biopsied to rule out melanoma.    08/29/21:  Diagnosis   A: Colon, biopsy:  -Severely active chronic colitis with erosion.     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       Echo 04/29/19:  Summary    1. The left ventricle is normal in size with normal wall thickness.    2. The left ventricular systolic function is normal, LVEF is visually  estimated at 55%.    3. The right ventricle is upper normal in size, with normal systolic  function.          PATHOLOGY:  Pelvic LN 01/09/18:  Final Diagnosis   A: Colon, transverse, biopsy  - Moderately-severely active chronic colitis  - No CMV viral cytopathic effect, granuloma, or dysplasia identified  - No metastatic melanoma or papillary  thyroid cancer identified  - See comment     B: Lymph node, pelvic, core needle biopsy  - Metastatic melanoma  - See comment        FNA R femur 07/19/14:  : Bone, right femur, core biopsy with touch imprints  - No primary or metastatic malignancy identified  - Bone with focal osteocyte dropout, suggestive of necrosis  - Bone marrow with trilineage hematopoiesis and focal effacement by chronic inflammation (see comment)    B: Bone, right femur, fine needle aspiration  - Few atypical cells present on direct smears (see comment)  - Scant fragments of edematous stromal tissue in cell block  - Bone marrow with trilineage hematopoiesis    Diagnosis:  Lymph nodes, right cervical, levels 2-4, removal and partial parotidectomy  -Metastatic melanoma involving 1 out of 20 lymph nodes, with the largest  diameter measuring 10 mm (1.0 cm) and no evidence of extracapsular extension  (1/20)  -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

## 2022-01-17 NOTE — Unmapped (Signed)
COMPLEX CASE MANAGEMENT   FOLLOW UP NOTE  Summary:  Complex Case Manager  spoke with patient and verified correct patient using two identifiers today for Complex Case Management follow up. Patient currently resides at Home. Primary concern is work stress.      Subjective:    Patient/caregiver reported he is currently dealing with work stress. Patient had been out sick and had a cancer follow up appointment and when he went back to work found out his job had been replaced by someone else and they offered the patient another position that he is not capable of working. Patient states he wished he would have known prior to changing to a higher priced insurance. CM asked if there was any assistance that could help patient at this time and patient denied any needs.  CM will follow back up with patient in 3 weeks.    Objective:     Screenings Completed during visit: None completed at this visit    Barriers to care: Health Literacy    Interventions provided: Care Coordination    Progress towards cancer appt follow up goal : Completed 01/17/22    Plan:     1. Case Management to: Follow up in 3 weeks    2. Patient/caregiver to: call PCP if questions/concerns arise    Discuss at next outreach: Graduation from Complex Case Management    Care Coordination Note updated in [facility]: Yes    Future Appointments   Date Time Provider Department Center   02/01/2022 11:00 AM [place], Epimenio Sarin, RD/LDN [facility]   03/01/2022  9:40 AM Mangel, Benison Pap, DO [facility]   04/10/2022  1:20 PM Reita Cliche, MD [facility]   04/17/2022 11:00 AM IC MRI RM 1 [facility] [place] [facility]   04/17/2022 12:00 PM IC CT RM 1 [facility] - [facility]   04/17/2022  1:45 PM ADULT ONC LAB [facility]   04/17/2022  3:00 PM [name], MD [facility]       Problem List         Underactive thyroid      Low calcium levels      Thyroid cancer      Malignant neoplasm of head and neck      Hernia with obstruction Inflammatory bowel disease (Crohn's disease)      Gout      01/24/2020: Open, primary ventral hernia repair for SBO      Obesity      Nontoxic uninodular goiter      Arthritis of low back      Diverticulitis of intestine      Diverticulosis of colon      Iron deficiency anemia      Suicidal thoughts      Atherosclerotic cardiovascular disease      High cholesterol or triglycerides       Allergies:   Allergies   Allergen Reactions    Compazine [Prochlorperazine] Itching    Coconut Nausea And Vomiting       Medications:  Prior to Admission medications    Medication Dose, Route, Frequency   acetaminophen (TYLENOL) 500 MG tablet 1,000 mg, Oral, Every 8 hours PRN   binimetinib ([name]) 15 mg tablet 45 mg, Oral, 2 times a day (standard)   blood sugar diagnostic Strp Dispense 100 blood glucose test strips, ok to sub any brand preferred by insurance/patient, use 3x/day; dispense whatever brand matches with meter.   blood-glucose meter kit  Use as instructed; dispense 1 meter, whatever is preferred by insurance   calcitriol (ROCALTROL) 0.25 MCG capsule 0.25 mcg, Oral, Daily (standard)   calcium carbonate 650 mg calcium (1,625 mg) tablet 1 tablet, Oral, 3 times a day (with meals)   DULoxetine (CYMBALTA) 30 MG capsule 30 mg, Oral, 2 times a day (standard)   encorafenib (BRAFTOVI) 75 mg capsule 450 mg, Oral, Daily (standard)   fexofenadine (ALLEGRA ALLERGY) 180 MG tablet 180 mg, Oral, Daily PRN   fluticasone propionate (FLONASE) 50 mcg/actuation nasal spray 1 spray, Each Nare, Daily (standard)   folic acid (FOLVITE) 1 MG tablet TAKE 1 TABLET BY MOUTH EVERY DAY   guaiFENesin (MUCINEX) 600 mg 12 hr tablet 600 mg, Oral, Every 12 hours   ibuprofen (ADVIL,MOTRIN) 200 MG tablet 600 mg, Oral, Daily PRN   lancets Misc Dispense 100 lancets, ok to sub any brand preferred by insurance/patient, use 3x/day   levothyroxine (SYNTHROID) 125 MCG tablet Take 375 mcg daily; please dispense 90-day supply   memantine (NAMENDA) 10 MG tablet 10 mg, Oral, 2 times a day (standard)   mirtazapine (REMERON) 7.5 MG tablet 7.5 mg, Oral, Nightly   ofloxacin (OCUFLOX) 0.3 % ophthalmic solution Instill 4 drops in left ear twice daily for 5 days   ondansetron (ZOFRAN) 8 MG tablet 1 tablet, Oral, Take as directed   rosuvastatin (CRESTOR) 10 MG tablet 10 mg, Oral, Nightly   sulfaSALAzine (AZULFIDINE) 500 mg tablet 2,000 mg, Oral, 2 times a day          Sherlyn Hay, RN  01/17/2022

## 2022-01-21 LAB — VITAMIN D 25 HYDROXY: VITAMIN D, TOTAL (25OH): 17.4 ng/mL — ABNORMAL LOW (ref 20.0–80.0)

## 2022-01-21 NOTE — Unmapped (Signed)
I spoke with patient [facility] to confirm appointments on the following date(s): [name] confirmed that the appointment times and dates for 04/17/22 will work.    [name]

## 2022-01-30 DIAGNOSIS — C439 Malignant melanoma of skin, unspecified: Principal | ICD-10-CM

## 2022-01-30 MED ORDER — MIRTAZAPINE 7.5 MG TABLET
ORAL_TABLET | 3 refills | 0 days
Start: 2022-01-30 — End: ?

## 2022-02-01 NOTE — Unmapped (Unsigned)
Nutrition Consult Note    Nutrition counseling not conducted. Pt arrived for 11am visit after 15 min grace period at 12:11pm, not seen. RD will follow up to reschedule via mychart message.    Kenneth Fitzgerald, MPH, RD, LDN Nontoxic uninodular goiter    MDD (major depressive disorder), recurrent episode, severe (CMS-HCC)    Lumbosacral spondylosis without myelopathy    Diverticulitis of colon (without mention of hemorrhage)(562.11)    Depressive disorder    Diverticulosis of colon    Iron deficiency anemia    Suicidal ideation    Atherosclerotic cardiovascular disease    HLD (hyperlipidemia)       Present height    Present weight    Current BMI      Weight Status Categories:  Underweight:  BMI < 18.5  Normal Weight:  BMI 18.5 - 24.9  Overweight:  BMI 25 - 29.9  Obesity Class I:  BMI 30 - 34.9  Obesity Class II:  BMI 35 - 39.9  Obesity Class III:  BMI ? 40        Weight History:  Wt Readings from Last 6 Encounters:   11/29/21 (!) 147.7 kg (325 lb 11.2 oz)   11/21/21 (!) 148.9 kg (328 lb 4.2 oz)   10/31/21 (!) 148.1 kg (326 lb 9.6 oz)   10/18/21 (!) 149.2 kg (329 lb)   10/10/21 (!) 147.2 kg (324 lb 9.6 oz)   08/29/21 (!) 147.4 kg (325 lb)       Allergies:   Allergies   Allergen Reactions    Compazine [Prochlorperazine] Itching    Coconut Nausea And Vomiting       Relevant Medications, Herbs, Supplements:    Current Outpatient Medications:     acetaminophen (TYLENOL) 500 MG tablet, Take 2 tablets (1,000 mg total) by mouth every eight (8) hours as needed for pain., Disp: 30 tablet, Rfl: 0    binimetinib ([name]) 15 mg tablet, Take 3 tablets (45 mg total) by mouth two (2) times a day., Disp: 180 tablet, Rfl: 11    blood sugar diagnostic Strp, Dispense 100 blood glucose test strips, ok to sub any brand preferred by insurance/patient, use 3x/day; dispense whatever brand matches with meter., Disp: 100 strip, Rfl: 12    blood-glucose meter kit, Use as instructed; dispense 1 meter, whatever is preferred by insurance, Disp: 1 each, Rfl: 1    calcitriol (ROCALTROL) 0.25 MCG capsule, Take 1 capsule (0.25 mcg total) by mouth daily., Disp: 90 capsule, Rfl: 3    calcium carbonate 650 mg calcium (1,625 mg) tablet, Take 1 tablet (650 mg of elem calcium total) by mouth Three (3) times a day with a meal., Disp: , Rfl:     DULoxetine (CYMBALTA) 30 MG capsule, Take 1 capsule (30 mg total) by mouth two (2) times a day., Disp: 180 capsule, Rfl: 2    encorafenib (BRAFTOVI) 75 mg capsule, Take 6 capsules (450 mg total) by mouth daily., Disp: 180 capsule, Rfl: 11    fexofenadine (ALLEGRA ALLERGY) 180 MG tablet, Take 1 tablet (180 mg total) by mouth daily as needed (allergies)., Disp: , Rfl:     fluticasone propionate (FLONASE) 50 mcg/actuation nasal spray, 1 spray into each nostril daily., Disp: , Rfl:     folic acid (FOLVITE) 1 MG tablet, TAKE 1 TABLET BY MOUTH EVERY DAY, Disp: 90 tablet, Rfl: 2    guaiFENesin (MUCINEX) 600 mg 12 hr tablet, Take 1 tablet (600 mg total) by mouth every twelve (12) hours., Disp: , Rfl:  ibuprofen (ADVIL,MOTRIN) 200 MG tablet, Take 3 tablets (600 mg total) by mouth daily as needed for pain., Disp: , Rfl:     lancets Misc, Dispense 100 lancets, ok to sub any brand preferred by insurance/patient, use 3x/day, Disp: 100 each, Rfl: 12    levothyroxine (SYNTHROID) 125 MCG tablet, Take 375 mcg daily; please dispense 90-day supply, Disp: 270 tablet, Rfl: 3    memantine (NAMENDA) 10 MG tablet, Take 1 tablet (10 mg total) by mouth Two (2) times a day., Disp: 60 tablet, Rfl: 6    mirtazapine (REMERON) 7.5 MG tablet, Take 1 tablet (7.5 mg total) by mouth nightly., Disp: 90 tablet, Rfl: 3    ofloxacin (OCUFLOX) 0.3 % ophthalmic solution, Instill 4 drops in left ear twice daily for 5 days, Disp: 5 mL, Rfl: 3    ondansetron (ZOFRAN) 8 MG tablet, Take 1 tablet (8 mg total) by mouth Take as directed., Disp: , Rfl:     rosuvastatin (CRESTOR) 10 MG tablet, Take 1 tablet (10 mg total) by mouth nightly., Disp: 90 tablet, Rfl: 0    sulfaSALAzine (AZULFIDINE) 500 mg tablet, Take 4 tablets (2,000 mg total) by mouth two (2) times a day., Disp: 240 tablet, Rfl: 11    Do you have any concerns about taking or affording your medications? If Yes, consider CAMP referral ([id number])  No concerns    Relevant Labs:  HGB A1C, POC (%)   Date Value   08/30/2019 5.4     Hemoglobin A1C (%)   Date Value   05/10/2020 5.4   01/03/2020 5.8 (H)   06/22/2018 6.0 (H)   09/22/2012 5.2      No components found for: LDLCALC   BP Readings from Last 3 Encounters:   01/16/22 136/91   11/29/21 141/94   11/21/21 144/85     Lab Results   Component Value Date    CHOL 254 (H) 10/31/2021     Lab Results   Component Value Date    HDL 37 (L) 10/31/2021     Lab Results   Component Value Date    LDL 161 (H) 10/31/2021     Lab Results   Component Value Date    VLDL 56 (H) 10/31/2021     Lab Results   Component Value Date    CHOLHDLRATIO 6.9 (H) 10/31/2021     Lab Results   Component Value Date    TRIG 280 (H) 10/31/2021    TRIG 200 (H) 01/27/2020       Lab Results   Component Value Date    VITDTOTAL 17.4 (L) 01/16/2022     Lab Results   Component Value Date    VITAMINB12 509 01/16/2022         Physical Activity:  Type: weightlifting   Some limited activity- notes medication side effect of joint inflammation    24-Hour recall/usual intake:    Time Intake   Breakfast Frozen biscuit  OR eggs with bacon with 16oz milk (1% choc + 1% reg mixture)   Snack (AM)    Lunch PB& J sandwich OR 1 hot pocket  Yesterday- 1/2 slice of pizza   Snack (PM)    Dinner [employer] OR takeout (divides into 2-3 meals)  Last night- 1/2 slice of pizza     Snack (HS) With medicine at night- 4-5 oz milk (1% choc + 1% reg mixture)     Other Nutrition Information:  Reviewed usual intakes and activity patterns since last visit with pt along with  progress on previous goals.  Weight on home scale:   Pt reported updates since last visit:        Pt reported areas for improvement:   12/28/21:  PATIENT STATED FOCUS: pt states he had thyroid removed 9 years ago and has been unable to lose weight; in the last 6 months has gained 5-10 lbs;  In the past has worked on portion control, decreasing calories and increased exercise; notes no sustainable weight loss  -does not check weight at home; usually at doc appts; states clothes are fitting looser since last month  -since Jan has removed regular soda- is having zero sugar soda    Reviewed usual intakes and activity patterns. Answered questions from pt.  Supportive listening provided and motivational interviewing conducted.      HISTORY   Cancer- stage 4 melanoma; previous stage 3 thyroid; many recurrances- brain chest, abdomen; taking many medications; last radiation treatment Jan 2022  Calcium deficiency- drinks lot of milk        MEAL PATTERN   3 meals    USUAL INTAKES  Dairy: 1% milk, Greek yogurt, cottage cheese, and cheese  Fruit: <1 serving per day 1-2 servings per week  Vegetables: <1 serving per day notes recent decreases  Starches: cereal (froot loops, frosted flakes, rice krispies, honey nut cheerios), bread (white and whole wheat), pasta (white), and rice (white)  Protein: chicken/poultry, beef, fish, eggs, nut butters, and beans  Snacks / Sweets: varies  Oils / Fats: olive oil and canola oil  Sodium: does not add salt to foods and frequent intake of fast food/processed foods        PORTIONS    Pt reports they have improved the last few months; when eating out takes half home      BEVERAGES   milk (1% choc + 1% reg mixture)  Water- 24-32oz  Zero sugar soda- no more than 1 bottle per day; not daily; recently reduced intakes     COOKING   Lives alone, likes cooking when has time     DINING OUT   1-2 times per week     Alcohol: 1 drink per month  Tobacco: none         Food Insecurity: No Food Insecurity (11/05/2021)    Hunger Vital Sign     Worried About Running Out of Food in the Last Year: Never true     Ran Out of Food in the Last Year: Never true          Estimated Daily Needs:  2400 calories (Mifflin-St Jeor equation for BMI > 25 using actual body weight, adjusted for weight loss)  118-147 g protein      Nutrition Diagnosis:  Overweight/obesity related to decreased energy expenditure due to sedentary lifestyle as evidenced by pt report, BMI > 30    Nutrition Intervention:  Nutrition Counseling    Materials Provided:  none    Social Determinants of Health interventions provided: N/A - no concerns identified.     Patient Stated Nutrition Goals:  Work to gradually increase water intake by adding an additional bottle of water.  Aim to have at least 1 vegetable with each dinner.  With breakfast, add one serving of fruit.   Before next RD visit, track intakes for at least 2 weeks.  Goals Added to Visit Navigator?  Yes    Monitoring/Evaluation:  Progress towards goals:  baseline    Follow-up:  5 weeks-***     Length of visit was ***  minutes  2 unit(s) billed per insurance    Kenneth Fitzgerald, MPH, RD, LDN

## 2022-02-04 ENCOUNTER — Ambulatory Visit: Admit: 2022-02-04 | Discharge: 2022-02-05 | Payer: PRIVATE HEALTH INSURANCE

## 2022-02-04 NOTE — Unmapped (Signed)
[facility] Clinical Assessment & Refill Coordination Note    Kenneth Fitzgerald [facility], DOB: 02-Oct-1975  Phone: [phone] (home)     All above HIPAA information was verified with patient.     Was a Nurse, learning disability used for this call? No    Specialty Medication(s):   Hematology/Oncology: Kenneth Fitzgerald     Current Outpatient Medications   Medication Sig Dispense Refill   ??? acetaminophen (TYLENOL) 500 MG tablet Take 2 tablets (1,000 mg total) by mouth every eight (8) hours as needed for pain. 30 tablet 0   ??? binimetinib ([name]) 15 mg tablet Take 3 tablets (45 mg total) by mouth two (2) times a day. 180 tablet 11   ??? blood sugar diagnostic Strp Dispense 100 blood glucose test strips, ok to sub any brand preferred by insurance/patient, use 3x/day; dispense whatever brand matches with meter. 100 strip 12   ??? blood-glucose meter kit Use as instructed; dispense 1 meter, whatever is preferred by insurance 1 each 1   ??? calcitriol (ROCALTROL) 0.25 MCG capsule Take 1 capsule (0.25 mcg total) by mouth daily. 90 capsule 3   ??? calcium carbonate 650 mg calcium (1,625 mg) tablet Take 1 tablet (650 mg of elem calcium total) by mouth Three (3) times a day with a meal.     ??? DULoxetine (CYMBALTA) 30 MG capsule Take 1 capsule (30 mg total) by mouth two (2) times a day. 180 capsule 2   ??? encorafenib (BRAFTOVI) 75 mg capsule Take 6 capsules (450 mg total) by mouth daily. 180 capsule 11   ??? fexofenadine (ALLEGRA ALLERGY) 180 MG tablet Take 1 tablet (180 mg total) by mouth daily as needed (allergies).     ??? fluticasone propionate (FLONASE) 50 mcg/actuation nasal spray 1 spray into each nostril daily.     ??? folic acid (FOLVITE) 1 MG tablet TAKE 1 TABLET BY MOUTH EVERY DAY 90 tablet 2   ??? guaiFENesin (MUCINEX) 600 mg 12 hr tablet Take 1 tablet (600 mg total) by mouth every twelve (12) hours.     ??? ibuprofen (ADVIL,MOTRIN) 200 MG tablet Take 3 tablets (600 mg total) by mouth daily as needed for pain.     ??? lancets Misc Dispense 100 lancets, ok to sub any brand preferred by insurance/patient, use 3x/day 100 each 12   ??? levothyroxine (SYNTHROID) 125 MCG tablet Take 375 mcg daily; please dispense 90-day supply 270 tablet 3   ??? memantine (NAMENDA) 10 MG tablet Take 1 tablet (10 mg total) by mouth Two (2) times a day. 60 tablet 6   ??? mirtazapine (REMERON) 7.5 MG tablet Take 1 tablet (7.5 mg total) by mouth nightly. 90 tablet 3   ??? ofloxacin (OCUFLOX) 0.3 % ophthalmic solution Instill 4 drops in left ear twice daily for 5 days 5 mL 3   ??? ondansetron (ZOFRAN) 8 MG tablet Take 1 tablet (8 mg total) by mouth Take as directed.     ??? rosuvastatin (CRESTOR) 10 MG tablet Take 1 tablet (10 mg total) by mouth nightly. 90 tablet 0   ??? sulfaSALAzine (AZULFIDINE) 500 mg tablet Take 4 tablets (2,000 mg total) by mouth two (2) times a day. 240 tablet 11     No current facility-administered medications for this visit.        Changes to medications: Kenneth Fitzgerald reports no changes at this time.    Allergies   Allergen Reactions   ??? Compazine [Prochlorperazine] Itching   ??? Coconut Nausea And Vomiting  Changes to allergies: No    SPECIALTY MEDICATION ADHERENCE     Braftovi 75 mg: 7 days of medicine on hand   [name] 15 mg: 7 days of medicine on hand     Medication Adherence    Patient reported X missed doses in the last month: 0  Specialty Medication: Braftovi 75mg   Patient is on additional specialty medications: Yes  Additional Specialty Medications: [name] 15mg                       Specialty medication(s) dose(s) confirmed: Regimen is correct and unchanged.     Are there any concerns with adherence? No    Adherence counseling provided? Not needed    CLINICAL MANAGEMENT AND INTERVENTION      Clinical Benefit Assessment:    Do you feel the medicine is effective or helping your condition? Yes    Clinical Benefit counseling provided? Not needed    Adverse Effects Assessment:    Are you experiencing any side effects? No    Are you experiencing difficulty administering your medicine? No    Quality of Life Assessment:    Quality of Life      Oncology  1. What impact has your specialty medication had on the reduction of your daily pain or discomfort level?: None  2. On a scale of 1-10, how would you rate your ability to manage side effects associated with your specialty medication? (1=no issues, 10 = unable to take medication due to side effects): 1            How many days over the past month did your condition/medication  keep you from your normal activities? For example, brushing your teeth or getting up in the morning. 0    Have you discussed this with your provider? Not needed    Acute Infection Status:    Acute infections noted within Epic:  No active infections  Patient reported infection: None    Therapy Appropriateness:    Is therapy appropriate and patient progressing towards therapeutic goals? Yes, therapy is appropriate and should be continued    DISEASE/MEDICATION-SPECIFIC INFORMATION      N/A    Oncology: Is the patient receiving adequate infection prevention treatment? Not applicable  Does the patient have adequate nutritional support? Not applicable    PATIENT SPECIFIC NEEDS     - Does the patient have any physical, cognitive, or cultural barriers? No    - Is the patient high risk? Yes, patient is taking oral chemotherapy. Appropriateness of therapy as been assessed    - Did the patient require a clinical intervention? No    - Does the patient require physician intervention or other additional services (i.e., nutrition, smoking cessation, social work)? No    SOCIAL DETERMINANTS OF HEALTH     At the [facility], we have learned that life circumstances - like trouble affording food, housing, utilities, or transportation can affect the health of many of our patients.   That is why we wanted to ask: are you currently experiencing any life circumstances that are negatively impacting your health and/or quality of life? No    Social Determinants of Health     Financial Resource Strain: Low Risk  (11/05/2021)    Overall Financial Resource Strain (CARDIA)    ??? Difficulty of Paying Living Expenses: Not very hard   Internet Connectivity: No Internet connectivity concern identified (11/05/2021)    Internet Connectivity    ??? Do you have access  to internet services: Yes    ??? How do you connect to the internet: Personal Device at home    ??? Is your internet connection strong enough for you to watch video on your device without major problems?: Yes    ??? Do you have enough data to get through the month?: Yes    ??? Does at least one of the devices have a camera that you can use for video chat?: Yes   Food Insecurity: No Food Insecurity (11/05/2021)    Hunger Vital Sign    ??? Worried About Running Out of Food in the Last Year: Never true    ??? Ran Out of Food in the Last Year: Never true   Tobacco Use: Low Risk  (12/01/2021)    Patient History    ??? Smoking Tobacco Use: Never    ??? Smokeless Tobacco Use: Never    ??? Passive Exposure: Not on file   Housing/Utilities: Low Risk  (11/05/2021)    Housing/Utilities    ??? Within the past 12 months, have you ever stayed: outside, in a car, in a tent, in an overnight shelter, or temporarily in someone else's home (i.e. couch-surfing)?: No    ??? Are you worried about losing your housing?: No    ??? Within the past 12 months, have you been unable to get utilities (heat, electricity) when it was really needed?: No   Alcohol Use: Not At Risk (11/05/2021)    Alcohol Use    ??? How often do you have a drink containing alcohol?: Monthly or less    ??? How many drinks containing alcohol do you have on a typical day when you are drinking?: 1 - 2    ??? How often do you have 5 or more drinks on one occasion?: Never   Transportation Needs: No Transportation Needs (11/05/2021)    PRAPARE - Transportation    ??? Lack of Transportation (Medical): No    ??? Lack of Transportation (Non-Medical): No   Substance Use: Low Risk  (11/05/2021)    Substance Use    ??? Taken prescription drugs for non-medical reasons: Never    ??? Taken illegal drugs: Never    ??? Patient indicated they have taken drugs in the past year for non-medical reasons: Yes, [positive answer(s)]: Not on file   Health Literacy: Low Risk  (11/05/2021)    Health Literacy    ??? : Never   Physical Activity: Inactive (11/05/2021)    Exercise Vital Sign    ??? Days of Exercise per Week: 0 days    ??? Minutes of Exercise per Session: 0 min   Interpersonal Safety: Not at risk (11/05/2021)    Interpersonal Safety    ??? Unsafe Where You Currently Live: No    ??? Physically Hurt by Anyone: No    ??? Abused by Anyone: No   Stress: Stress Concern Present (11/05/2021)    [employer] of Occupational Health - Occupational Stress Questionnaire    ??? Feeling of Stress : To some extent   Intimate Partner Violence: Not At Risk (11/05/2021)    Humiliation, Afraid, Rape, and Kick questionnaire    ??? Fear of Current or Ex-Partner: No    ??? Emotionally Abused: No    ??? Physically Abused: No    ??? Sexually Abused: No   Depression: Not at risk (11/12/2021)    PHQ-2    ??? PHQ-2 Score: 1   Social Connections: Moderately Isolated (11/05/2021)    Social Connection and Isolation  Panel [NHANES]    ??? Frequency of Communication with Friends and Family: More than three times a week    ??? Frequency of Social Gatherings with Friends and Family: Once a week    ??? Attends Religious Services: More than 4 times per year    ??? Active Member of Clubs or Organizations: No    ??? Attends Banker Meetings: Never    ??? Marital Status: Never married       Would you be willing to receive help with any of the needs that you have identified today? Not applicable       SHIPPING     Specialty Medication(s) to be Shipped:   Hematology/Oncology: [name] and [name]    Other medication(s) to be shipped: No additional medications requested for fill at this time     Changes to insurance: No    Delivery Scheduled: Yes, Expected medication delivery date: 02/07/22. Medication will be delivered via UPS to the confirmed prescription address in [facility].    The patient will receive a drug information handout for each medication shipped and additional FDA Medication Guides as required.  Verified that patient has previously received a Conservation officer, historic buildings and a Surveyor, mining.    The patient or caregiver noted above participated in the development of this care plan and knows that they can request review of or adjustments to the care plan at any time.      All of the patient's questions and concerns have been addressed.    Rollen Sox, [facility]   [facility] Specialty Pharmacist

## 2022-02-04 NOTE — Unmapped (Signed)
Complex Case Management   Final Call- Program Completion  Summary:   Case Manager spoke with patient today to resolve Complex Case Management services. Patient currently resides at Home.    Patient/caregiver reported he has been stressed dealing with job situation. Patient's job position has changed and patient is in the process for looking for another job or applying for disability. CM did discuss following up with oncologist and talking about disability and need for assistance with filling out forms. Patient voiced understanding. CM discussed stress and patient reports his stress is high d/t his job situation. CM asked patient what activity helps him relieve stress and patient states playing with his dog. CM discussed deep breathing and mindfulness. CM will send both videos to patient via mychart. CM also discussed continued follow up with Dr. Electa Sniff. CM discussed upcoming follow up appt with PCP, which patient is aware.     How would you rate your overall health right now?  (Scale of 1-5) 3- Good     Health Literacy: How confident are you that you understand your health issues/concerns, can participate in your care, and manage your care along with your physician (Scale of 1-3): 3- Confident    Plan:   Follow up with PCP every 3 months; next appointment is 03/01/22    Care Coordination Note updated in [facility]: Yes    Pt Care Coordination Note:  This patient completed Complex Case Management services on 02/04/2022.     The following barriers were addressed: Health Literacy    The following interventions were provided: Supportive Listening     If this patient develops new chronic conditions, new opportunities to resolve barriers, or other areas of need, please send an AMB Referral to Case Management to the Personal Health Advocate Department.

## 2022-02-06 MED FILL — MEKTOVI 15 MG TABLET: ORAL | 30 days supply | Qty: 180 | Fill #3

## 2022-02-06 MED FILL — BRAFTOVI 75 MG CAPSULE: ORAL | 30 days supply | Qty: 180 | Fill #3

## 2022-02-08 ENCOUNTER — Telehealth: Admit: 2022-02-08 | Discharge: 2022-02-09 | Payer: PRIVATE HEALTH INSURANCE | Attending: Clinical | Primary: Clinical

## 2022-02-08 DIAGNOSIS — C801 Malignant (primary) neoplasm, unspecified: Principal | ICD-10-CM

## 2022-02-08 DIAGNOSIS — F411 Generalized anxiety disorder: Principal | ICD-10-CM

## 2022-02-08 DIAGNOSIS — E785 Hyperlipidemia, unspecified: Principal | ICD-10-CM

## 2022-02-08 DIAGNOSIS — F331 Major depressive disorder, recurrent, moderate: Principal | ICD-10-CM

## 2022-02-08 DIAGNOSIS — I2510 Atherosclerotic heart disease of native coronary artery without angina pectoris: Principal | ICD-10-CM

## 2022-02-08 MED ORDER — ROSUVASTATIN 10 MG TABLET
ORAL_TABLET | Freq: Every evening | ORAL | 1 refills | 90 days | Status: CP
Start: 2022-02-08 — End: 2023-02-08

## 2022-02-08 NOTE — Unmapped (Signed)
[facility]  Psychiatry  Psychotherapy Note - Telehealth via Video     Service Date: February 08, 2022  Service: 48 minutes of psychotherapy via video session  Time with Patient: 48 minutes     Encounter Description: This encounter was conducted from provider's office via EPIC video session due to COVID-19 pandemic. Mekael Angelopoulos was located in his home. Session was conducted via EPIC.  Rationale for video session is need to social distance. See Plan for telemedicine consent/disclaimer.      Encounter Description/Consent:   [name] visit was completed through telehealth encounter (video).      This patient encounter is appropriate and reasonable under the circumstances. The patient has been advised of the potential risks and limitations of this mode of treatment (including, but not limited to, the absence of in-person examination) and has agreed to be treated in a remote fashion in spite of them. Any and all of the patient's/patient's family's questions on this issue have been answered.      The patient was physically located in [place] in which I am permitted to provide care. The patient understood that he may incur co-pays and cost sharing, and agreed to the telemedicine visit. The visit was reasonable and appropriate under the circumstances given the patient's presentation at the time.      Time Spent: 48 minutes     Assessment:  Mr. Lowe is a 47yo male with a history of metastatic melanoma and thyroid cancer who I have seen in psychotherapy for depression and adjustment to multiple stressors (last session was in June 2023). Mr. Jaquay recent reached out to schedule today's session; we met via telehealth.  Mr. Astuto shared that he recently lost his job, which has affected him emotionally and led to increased uncertainty about his immediate future.  He endorsed depressive and anxiety symptoms; denied any thoughts of self-harm.  I offered psychotherapy, support, recommendations for addressing distress, and problem-solving for next steps.  Mr. Henningsen has not taken Cymbalta recently due to being out of refills; will reconnect him with [facility].  I will see Mr. Rossow next in two weeks.     Risk Assessment:  A suicide and violence risk assessment was performed as part of this evaluation. There patient is deemed to be at chronic elevated risk for self-harm/suicide given the following factors: current diagnosis of depression, suicidal ideation or threats without a plan and past diagnosis of depression. There patient is deemed to be at chronic elevated risk for violence given the following factors: male gender. These risk factors are mitigated by the following factors: lack of active SI/HI. There is no acute risk for suicide or violence at this time. The patient was educated about relevant modifiable risk factors including following recommendations for treatment of psychiatric illness and abstaining from substance abuse. While future psychiatric events cannot be accurately predicted, the patient does not currently require acute inpatient psychiatric care and does not currently meet [facility] involuntary commitment criteria.              Plan:  1. I will resume seeing Mr. Kiebler for psychotherapy, and we scheduled to meet next in two weeks.  2. Mr. Michalak has information on available [facility] resources and he has worked with [name], [name].  I have previously reached out to [name] to explore financial resources.  3. Mr. Por used to be followed by Dr. Willaim Bane.  He has not taken Cymbalta recently due to being out of refills.  I will reconnect him with [facility].  4. Mr. Wachholz knows to reach me as needed.     Subjective:   Mr. Petrosino presented as alert, oriented, and he was engaged.  He presented with depressed affect at times.    Today was my first session with Mr. Tin in seven months.  He stated that he reached to schedule with me again due to stress related to his work status.  He shared that he was demoted at work earlier this month and then asked to do a job that required more physical exertion that he could handle. The situation at his quickly became toxic and Mr. Gatewood felt compelled to resign earlier this week. 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  As much as it distressing to be forced to resign, it is equally anxiety-provoking to plan for an unexpected immediate future.  He has started looking for a new jobs, but has found no good leads.  Given his cancer history, the impact it still has on him physically, and his challenging prognosis, Mr. Laatsch is considering disability.     Mr. Martinz endorsed depressed mood, frustration, some helplessness, rumination, and anxiety about his future.  He denied any thoughts of self-harm.  Mr. Bottorff has taken anti-depressants in the past - most recently, Cymbalta - but has not taken since his refills ran out months ago. He was followed by Dr. Willaim Bane.  Mr. Helmich was open to my recommendation to reconnect him with [facility].    I provided Mr. Gericke support, understanding, and psychoeducation.  We discussed and processed his thoughts, feelings, and experiences in some depth.  We reviewed and problem-solved ways to tolerate the distressing aspects of his situation and take steps to address his distress. Mr. Single was engaged during our session, and my sense is that it was helpful for his to meet today. We agreed to resume meeting for psychotherapy. Will see next in two weeks.      Mental Status Exam:  Appearance:     Appears stated age   Motor:    No abnormal movements   Speech/Language:     Normal rate, volume, tone, fluency   Mood:   Frustrated worried   Affect:    Calm; cooperative   Thought process:    Logical, linear, clear, coherent, goal-directed   Thought content:      Denies SI/HI or thought of self-harm   Perceptual disturbances:      Behavior not concerning for response to internal stimuli      Orientation:    Oriented to person, place, time, and general circumstances   Attention:    Able to fully attend without fluctuations in consciousness   Concentration:    Able to fully concentrate and attend   Memory:    Immediate, short-term, long-term, and recall grossly intact    Fund of knowledge:     Consistent with level of education and development   Insight:      Intact   Judgment:     Intact   Impulse Control:    Intact      Diagnosis: Recurrent major depressive disorder, moderate;  Anxiety associated with cancer diagnosis     [name]. [place] Crance PhD  February 08, 2022

## 2022-02-13 ENCOUNTER — Telehealth: Admit: 2022-02-13 | Discharge: 2022-02-14 | Payer: PRIVATE HEALTH INSURANCE

## 2022-02-13 NOTE — Unmapped (Addendum)
Work to gradually increase water intake by adding an additional bottle of water.  Aim to have at least 1 vegetable with each dinner.  With breakfast, add one serving of fruit.   Before next RD visit, track intakes for at least 2 weeks.

## 2022-02-13 NOTE — Unmapped (Unsigned)
Nutrition Consult Note      PCP: Mangel, Benison Pap, DO  Address on File: [address], [place] [address], [place]      Verified as Current Location: Yes  Extended Emergency Contact Information  Primary Emergency Contact: Huntley, Tammy  Mobile Phone: [phone]  Relation: Sister  Preferred language: ENGLISH  Interpreter needed? No  Secondary Emergency Contact: Pugh,Jay   [place]  Home Phone: [phone]  Relation: Brother     Verified Behavioral Health Emergency Contact: Primary    {    Coding tips - Do not edit this text, it will delete upon signing of note!    Telephone visits [phone] for Physicians and APPs and [phone] for Non- Physician Clinicians)- Only use minutes on the phone to determine level of service.    Video visits [phone]) - Use either level of medical decision making just as an in-person visit OR time which includes both minutes on video and pre/post minutes to determine the level of service.      :[id number]}  The patient reports they are physically located in [place] and is currently: not at home. I conducted a audio/video visit. I spent  0s on the video call with the patient. I spent an additional 05 minutes on pre- and post-visit activities on the date of service .      Referring Provider:  Self, Referred    Reason for Referral:  No chief complaint on file.      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)    Malignant neoplasm of thyroid gland (CMS-HCC)    Postoperative hypothyroidism    Hypocalcemia    Thyroid cancer (CMS-HCC)    Primary melanoma of head and neck (CMS-HCC)    Ventral hernia with bowel obstruction    Insomnia    Crohn's disease of large intestine without complication (CMS-HCC)    Gout    01/24/2020: Open, primary ventral hernia repair for SBO    Morbid obesity with BMI of 40.0-44.9, adult (CMS-HCC)    Nontoxic uninodular goiter    MDD (major depressive disorder), recurrent episode, severe (CMS-HCC)    Lumbosacral spondylosis without myelopathy    Diverticulitis of colon (without mention of hemorrhage)(562.11)    Depressive disorder    Diverticulosis of colon    Iron deficiency anemia    Suicidal ideation    Atherosclerotic cardiovascular disease    HLD (hyperlipidemia)       Present height    Present weight    Current BMI      Weight Status Categories:  Underweight:  BMI < 18.5  Normal Weight:  BMI 18.5 - 24.9  Overweight:  BMI 25 - 29.9  Obesity Class I:  BMI 30 - 34.9  Obesity Class II:  BMI 35 - 39.9  Obesity Class III:  BMI ? 40        Weight History:  Wt Readings from Last 6 Encounters:   11/29/21 (!) 147.7 kg (325 lb 11.2 oz)   11/21/21 (!) 148.9 kg (328 lb 4.2 oz)   10/31/21 (!) 148.1 kg (326 lb 9.6 oz)   10/18/21 (!) 149.2 kg (329 lb)   10/10/21 (!) 147.2 kg (324 lb 9.6 oz)   08/29/21 (!) 147.4 kg (325 lb)       Allergies:   Allergies   Allergen Reactions    Compazine [Prochlorperazine] Itching    Coconut  Nausea And Vomiting       Relevant Medications, Herbs, Supplements:    Current Outpatient Medications:     acetaminophen (TYLENOL) 500 MG tablet, Take 2 tablets (1,000 mg total) by mouth every eight (8) hours as needed for pain., Disp: 30 tablet, Rfl: 0    binimetinib ([name]) 15 mg tablet, Take 3 tablets (45 mg total) by mouth two (2) times a day., Disp: 180 tablet, Rfl: 11    blood sugar diagnostic Strp, Dispense 100 blood glucose test strips, ok to sub any brand preferred by insurance/patient, use 3x/day; dispense whatever brand matches with meter., Disp: 100 strip, Rfl: 12    blood-glucose meter kit, Use as instructed; dispense 1 meter, whatever is preferred by insurance, Disp: 1 each, Rfl: 1    calcitriol (ROCALTROL) 0.25 MCG capsule, Take 1 capsule (0.25 mcg total) by mouth daily., Disp: 90 capsule, Rfl: 3    calcium carbonate 650 mg calcium (1,625 mg) tablet, Take 1 tablet (650 mg of elem calcium total) by mouth Three (3) times a day with a meal., Disp: , Rfl:     DULoxetine (CYMBALTA) 30 MG capsule, Take 1 capsule (30 mg total) by mouth two (2) times a day., Disp: 180 capsule, Rfl: 2    encorafenib (BRAFTOVI) 75 mg capsule, Take 6 capsules (450 mg total) by mouth daily., Disp: 180 capsule, Rfl: 11    fexofenadine (ALLEGRA ALLERGY) 180 MG tablet, Take 1 tablet (180 mg total) by mouth daily as needed (allergies)., Disp: , Rfl:     fluticasone propionate (FLONASE) 50 mcg/actuation nasal spray, 1 spray into each nostril daily., Disp: , Rfl:     folic acid (FOLVITE) 1 MG tablet, TAKE 1 TABLET BY MOUTH EVERY DAY, Disp: 90 tablet, Rfl: 2    guaiFENesin (MUCINEX) 600 mg 12 hr tablet, Take 1 tablet (600 mg total) by mouth every twelve (12) hours., Disp: , Rfl:     ibuprofen (ADVIL,MOTRIN) 200 MG tablet, Take 3 tablets (600 mg total) by mouth daily as needed for pain., Disp: , Rfl:     lancets Misc, Dispense 100 lancets, ok to sub any brand preferred by insurance/patient, use 3x/day, Disp: 100 each, Rfl: 12    levothyroxine (SYNTHROID) 125 MCG tablet, Take 375 mcg daily; please dispense 90-day supply, Disp: 270 tablet, Rfl: 3    memantine (NAMENDA) 10 MG tablet, Take 1 tablet (10 mg total) by mouth Two (2) times a day., Disp: 60 tablet, Rfl: 6    mirtazapine (REMERON) 7.5 MG tablet, Take 1 tablet (7.5 mg total) by mouth nightly., Disp: 90 tablet, Rfl: 3    ofloxacin (OCUFLOX) 0.3 % ophthalmic solution, Instill 4 drops in left ear twice daily for 5 days, Disp: 5 mL, Rfl: 3    ondansetron (ZOFRAN) 8 MG tablet, Take 1 tablet (8 mg total) by mouth Take as directed., Disp: , Rfl:     rosuvastatin (CRESTOR) 10 MG tablet, Take 1 tablet (10 mg total) by mouth nightly., Disp: 90 tablet, Rfl: 1    sulfaSALAzine (AZULFIDINE) 500 mg tablet, Take 4 tablets (2,000 mg total) by mouth two (2) times a day., Disp: 240 tablet, Rfl: 11    Do you have any concerns about taking or affording your medications? If Yes, consider [facility] referral ([id number])  No concerns    Relevant Labs:  HGB A1C, POC (%)   Date Value   08/30/2019 5.4     Hemoglobin A1C (%)   Date Value   05/10/2020 5.4   01/03/2020 5.8 (  H)   06/22/2018 6.0 (H)   09/22/2012 5.2      No components found for: LDLCALC   BP Readings from Last 3 Encounters:   01/16/22 136/91   11/29/21 141/94   11/21/21 144/85     Lab Results   Component Value Date    CHOL 254 (H) 10/31/2021     Lab Results   Component Value Date    HDL 37 (L) 10/31/2021     Lab Results   Component Value Date    LDL 161 (H) 10/31/2021     Lab Results   Component Value Date    VLDL 56 (H) 10/31/2021     Lab Results   Component Value Date    CHOLHDLRATIO 6.9 (H) 10/31/2021     Lab Results   Component Value Date    TRIG 280 (H) 10/31/2021    TRIG 200 (H) 01/27/2020       Lab Results   Component Value Date    VITDTOTAL 17.4 (L) 01/16/2022     Lab Results   Component Value Date    VITAMINB12 509 01/16/2022         Physical Activity:  Type: weightlifting   Some limited activity- notes medication side effect of joint inflammation    24-Hour recall/usual intake:    Time Intake   Breakfast Frozen biscuit  OR eggs with bacon with 16oz milk (1% choc + 1% reg mixture)  Cereal OR bagel with cream cheese   Snack (AM)    Lunch PB& J, [place] sandwich OR 1 hot pocket OR soup/stew (homemade)  Yesterday- 1/2 slice of pizza   Snack (PM)    Dinner [employer] OR takeout (divides into 2-3 meals)  Last night- 1/2 slice of pizza     Snack (HS) With medicine at night- 4-5 oz milk (1% choc + 1% reg mixture)     Other Nutrition Information:  Reviewed usual intakes and activity patterns since last visit with pt along with progress on previous goals.    Pt reported updates since last visit:  -notes high stress; job challenges  -notes times where there may have been over and under eating; has been trying not to snack, has not had chips; some large portions  -continues to drink water and some sodas  -has increased physical activity, notes some improvements with joint inflammation    Pt reported areas for improvement: finding ways to eat more proper meals-adding more fruits and vegetables    12/28/21:  PATIENT STATED FOCUS: pt states he had thyroid removed 9 years ago and has been unable to lose weight; in the last 6 months has gained 5-10 lbs;  In the past has worked on portion control, decreasing calories and increased exercise; notes no sustainable weight loss  -does not check weight at home; usually at doc appts; states clothes are fitting looser since last month  -since Jan has removed regular soda- is having zero sugar soda    Reviewed usual intakes and activity patterns. Answered questions from pt.  Supportive listening provided and motivational interviewing conducted.      HISTORY   Cancer- stage 4 melanoma; previous stage 3 thyroid; many recurrances- brain chest, abdomen; taking many medications; last radiation treatment Jan 2022  Calcium deficiency- drinks lot of milk        MEAL PATTERN   3 meals    USUAL INTAKES  Dairy: 1% milk, Greek yogurt, cottage cheese, and cheese  Fruit: <1 serving per day 1-2 servings per  week  Vegetables: <1 serving per day notes recent decreases  Starches: cereal (froot loops, frosted flakes, rice krispies, honey nut cheerios), bread (white and whole wheat), pasta (white), and rice (white)  Protein: chicken/poultry, beef, fish, eggs, nut butters, and beans  Snacks / Sweets: varies  Oils / Fats: olive oil and canola oil  Sodium: does not add salt to foods and frequent intake of fast food/processed foods        PORTIONS    Pt reports they have improved the last few months; when eating out takes half home      BEVERAGES   milk (1% choc + 1% reg mixture)  Water- 24-32oz  Zero sugar soda- no more than 1 bottle per day; not daily; recently reduced intakes     COOKING   Lives alone, likes cooking when has time     DINING OUT   1-2 times per week     Alcohol: 1 drink per month  Tobacco: none         Food Insecurity: No Food Insecurity (11/05/2021)    Hunger Vital Sign Worried About Running Out of Food in the Last Year: Never true     Ran Out of Food in the Last Year: Never true          Estimated Daily Needs:  2400 calories (Mifflin-St Jeor equation for BMI > 25 using actual body weight, adjusted for weight loss)  118-147 g protein      Nutrition Diagnosis:  Overweight/obesity related to decreased energy expenditure due to sedentary lifestyle as evidenced by pt report, BMI > 30    Nutrition Intervention:  Nutrition Counseling    Materials Provided:  none    Social Determinants of Health interventions provided: N/A - no concerns identified.     Patient Stated Nutrition Goals:  Work to gradually increase water intake by adding an additional bottle of water.  Aim to have at least 1 vegetable with each dinner.  With breakfast, add one serving of fruit.   Before next RD visit, track intakes for at least 2 weeks.  Goals Added to Visit Navigator?  Yes    Monitoring/Evaluation:  Progress towards goals:  continue    Follow-up:  5 weeks-***     Length of visit was ***  minutes  2 unit(s) billed per insurance    Carla Drape, MPH, RD, LDN

## 2022-02-22 ENCOUNTER — Telehealth: Admit: 2022-02-22 | Discharge: 2022-02-23 | Payer: PRIVATE HEALTH INSURANCE | Attending: Clinical | Primary: Clinical

## 2022-02-22 DIAGNOSIS — F331 Major depressive disorder, recurrent, moderate: Principal | ICD-10-CM

## 2022-02-22 DIAGNOSIS — F411 Generalized anxiety disorder: Principal | ICD-10-CM

## 2022-02-22 DIAGNOSIS — C801 Malignant (primary) neoplasm, unspecified: Principal | ICD-10-CM

## 2022-02-22 NOTE — Unmapped (Signed)
[facility]  Psychiatry  Psychotherapy Note - Telehealth via Video     Service Date: February 22, 2022  Service: 46 minutes of psychotherapy via video session  Time with Patient: 46 minutes     Encounter Description: This encounter was conducted from provider's office via EPIC video session due to COVID-19 pandemic. Kenneth Fitzgerald was located in his home. Session was conducted via EPIC.  Rationale for video session is need to social distance. See Plan for telemedicine consent/disclaimer.      Encounter Description/Consent:   Kenneth Fitzgerald visit was completed through telehealth encounter (video).      This patient encounter is appropriate and reasonable under the circumstances. The patient has been advised of the potential risks and limitations of this mode of treatment (including, but not limited to, the absence of in-person examination) and has agreed to be treated in a remote fashion in spite of them. Any and all of the patient's/patient's family's questions on this issue have been answered.      The patient was physically located in [place] in which I am permitted to provide care. The patient understood that he may incur co-pays and cost sharing, and agreed to the telemedicine visit. The visit was reasonable and appropriate under the circumstances given the patient's presentation at the time.      Time Spent: 46 minutes     Assessment:  Kenneth Fitzgerald is a 47yo male with a history of metastatic melanoma and thyroid cancer who I have resumed seeing for psychotherapy to address depression and adjustment to multiple stressors.  We met this morning via telehealth.  Kenneth Fitzgerald reported some improvement in mood in the two weeks since our last session, in part because he has found part-time employment.  He does continue to endorse some depressive and anxiety symptoms; denied any thoughts of self-harm.  I offered psychotherapy, support, recommendations for addressing distress.  I will see Kenneth Fitzgerald next later this month. He is scheduled to see Kenneth Amos, NP next week.     Risk Assessment:  A suicide and violence risk assessment was performed as part of this evaluation. There patient is deemed to be at chronic elevated risk for self-harm/suicide given the following factors: current diagnosis of depression, suicidal ideation or threats without a plan and past diagnosis of depression. There patient is deemed to be at chronic elevated risk for violence given the following factors: male gender. These risk factors are mitigated by the following factors: lack of active SI/HI. There is no acute risk for suicide or violence at this time. The patient was educated about relevant modifiable risk factors including following recommendations for treatment of psychiatric illness and abstaining from substance abuse. While future psychiatric events cannot be accurately predicted, the patient does not currently require acute inpatient psychiatric care and does not currently meet [facility] involuntary commitment criteria.              Plan:  1. Will continue to see Kenneth Fitzgerald for psychotherapy, scheduled to meet next at the end of the month.  2. Kenneth Fitzgerald is scheduled to see Kenneth Amos, NP on 2/13.  3. Kenneth Fitzgerald has information on available [facility] resources and he has worked with Kenneth Needs, [name].  I have previously reached out to Kenneth Fitzgerald to explore financial resources.  4. Kenneth Fitzgerald knows to reach me as needed.     Subjective:   Kenneth Fitzgerald presented as alert, oriented, and he was engaged.  His affect was  less depressed than two weeks ago.    Kenneth Fitzgerald reported that he was hired for - and has already started - a part-time job at his [employer], [employer] as a foreign [employer].  He likes the work thus far, has an agreeable schedule, and feels that the work is not overly taxing on him from a physical standpoint at this time.  Kenneth Fitzgerald is able to work 15-20 hours a week ~ 20 hours. He has also started the process of applying disabilty.  In part due to these developments, Kenneth Fitzgerald has seen some improvement in his mood and future-oriented outlook.  That said, he does continue to experience some depressive and anxious symptoms; denied any thoughts of self-harm or emergent psych concerns.  Stressors including his health status and past legal issues continue to contribute to his distress.    Kenneth Fitzgerald is scheduled to see Kenneth Amos, NP next week.  He has previously seen Kenneth Fitzgerald and taken Cymbalta, but has not taken this medication in months.  He continues to take Synthroid.     I provided Kenneth Fitzgerald support, understanding, and psychoeducation.  We discussed and processed his thoughts, feelings, and experiences again indepth.  We reviewed and problem-solved ways to tolerate depressive and anxiety symptoms.  As usual, Kenneth Fitzgerald was engaged during our session.  My sense is that therapy is beneficial for him and we scheduled to meet later this month.     Mental Status Exam:  Appearance:     Appears stated age   Motor:    No abnormal movements   Speech/Language:     Normal rate, volume, tone, fluency   Mood:   better   Affect:    Calm; cooperative   Thought process:    Logical, linear, clear, coherent, goal-directed   Thought content:      Denies SI/HI or thought of self-harm   Perceptual disturbances:      Behavior not concerning for response to internal stimuli      Orientation:    Oriented to person, place, time, and general circumstances   Attention:    Able to fully attend without fluctuations in consciousness   Concentration:    Able to fully concentrate and attend   Memory:    Immediate, short-term, long-term, and recall grossly intact    Fund of knowledge:     Consistent with level of education and development   Insight:      Intact   Judgment:     Intact   Impulse Control:    Intact      Diagnosis: Recurrent major depressive disorder, moderate;  Anxiety associated with cancer diagnosis     [name]. Raziah Funnell PhD  February 22, 2022

## 2022-02-26 NOTE — Unmapped (Signed)
Patient was unable to meet today for his scheduled psychiatry assessment visit due to a scheduling issue. Rescheduled patient for 03/04/22.

## 2022-03-01 ENCOUNTER — Ambulatory Visit
Admit: 2022-03-01 | Discharge: 2022-03-02 | Payer: PRIVATE HEALTH INSURANCE | Attending: Student in an Organized Health Care Education/Training Program | Primary: Student in an Organized Health Care Education/Training Program

## 2022-03-01 DIAGNOSIS — I2510 Atherosclerotic heart disease of native coronary artery without angina pectoris: Principal | ICD-10-CM

## 2022-03-01 DIAGNOSIS — Z2821 Immunization not carried out because of patient refusal: Principal | ICD-10-CM

## 2022-03-01 DIAGNOSIS — M62838 Other muscle spasm: Principal | ICD-10-CM

## 2022-03-01 DIAGNOSIS — D509 Iron deficiency anemia, unspecified: Principal | ICD-10-CM

## 2022-03-01 DIAGNOSIS — E278 Other specified disorders of adrenal gland: Principal | ICD-10-CM

## 2022-03-01 DIAGNOSIS — E785 Hyperlipidemia, unspecified: Principal | ICD-10-CM

## 2022-03-01 DIAGNOSIS — E559 Vitamin D deficiency, unspecified: Principal | ICD-10-CM

## 2022-03-01 LAB — CBC W/ AUTO DIFF
BASOPHILS ABSOLUTE COUNT: 0.1 10*9/L (ref 0.0–0.1)
BASOPHILS RELATIVE PERCENT: 0.8 %
EOSINOPHILS ABSOLUTE COUNT: 0.3 10*9/L (ref 0.0–0.5)
EOSINOPHILS RELATIVE PERCENT: 4.6 %
HEMATOCRIT: 42.3 % (ref 39.0–48.0)
HEMOGLOBIN: 14.3 g/dL (ref 12.9–16.5)
LYMPHOCYTES ABSOLUTE COUNT: 1 10*9/L — ABNORMAL LOW (ref 1.1–3.6)
LYMPHOCYTES RELATIVE PERCENT: 14.7 %
MEAN CORPUSCULAR HEMOGLOBIN CONC: 33.7 g/dL (ref 32.0–36.0)
MEAN CORPUSCULAR HEMOGLOBIN: 28.7 pg (ref 25.9–32.4)
MEAN CORPUSCULAR VOLUME: 85.2 fL (ref 77.6–95.7)
MEAN PLATELET VOLUME: 8.8 fL (ref 6.8–10.7)
MONOCYTES ABSOLUTE COUNT: 0.4 10*9/L (ref 0.3–0.8)
MONOCYTES RELATIVE PERCENT: 6.1 %
NEUTROPHILS ABSOLUTE COUNT: 4.9 10*9/L (ref 1.8–7.8)
NEUTROPHILS RELATIVE PERCENT: 73.8 %
PLATELET COUNT: 198 10*9/L (ref 150–450)
RED BLOOD CELL COUNT: 4.97 10*12/L (ref 4.26–5.60)
RED CELL DISTRIBUTION WIDTH: 17.7 % — ABNORMAL HIGH (ref 12.2–15.2)
WBC ADJUSTED: 6.6 10*9/L (ref 3.6–11.2)

## 2022-03-01 LAB — IRON & TIBC
IRON SATURATION: 28 % (ref 20–55)
IRON: 75 ug/dL
TOTAL IRON BINDING CAPACITY: 267 ug/dL (ref 250–425)

## 2022-03-01 LAB — SLIDE REVIEW

## 2022-03-01 LAB — LIPID PANEL
CHOLESTEROL/HDL RATIO SCREEN: 8.1 — ABNORMAL HIGH (ref 1.0–4.5)
CHOLESTEROL: 275 mg/dL — ABNORMAL HIGH (ref <=200)
HDL CHOLESTEROL: 34 mg/dL — ABNORMAL LOW (ref 40–60)
LDL CHOLESTEROL CALCULATED: 176 mg/dL — ABNORMAL HIGH (ref 40–99)
NON-HDL CHOLESTEROL: 241 mg/dL — ABNORMAL HIGH (ref 70–130)
TRIGLYCERIDES: 327 mg/dL — ABNORMAL HIGH (ref 0–150)
VLDL CHOLESTEROL CAL: 65.4 mg/dL — ABNORMAL HIGH (ref 11–50)

## 2022-03-01 LAB — FERRITIN: FERRITIN: 74.7 ng/mL

## 2022-03-01 MED ORDER — CALCITRIOL 0.25 MCG CAPSULE
ORAL_CAPSULE | Freq: Every day | ORAL | 3 refills | 90 days | Status: CP
Start: 2022-03-01 — End: ?

## 2022-03-01 NOTE — Unmapped (Signed)
Assessment and Plan:         Diagnoses and all orders for this visit:    Atherosclerotic cardiovascular disease  -     Lipid Panel    Muscle spasms of both lower extremities    Immunization declined    Iron deficiency anemia, unspecified iron deficiency anemia type  -     CBC w/ Differential  -     Iron & TIBC  -     Ferritin    Vitamin D deficiency    Adrenal mass (CMS-HCC)  -     calcitriol (ROCALTROL) 0.25 MCG capsule; Take 1 capsule (0.25 mcg total) by mouth daily.        ----------------------------------------------------------------------  47 yo male presents for follow up   His PMH is complicated by: Stage IV Malignant melanoma, brain metastasis, portal vein thrombosis, Thyroid cancer, hypothyroidism, hypocalcemia, Atypical Crohns    Hypertension      Atherosclerotic Calcification          Preventative health   -Previously declined all vaccinations (he is due for COVID, Pneumococcal, Td booster and Flu)        To follow up in 3-6 months or sooner for acute concerns.         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   The muscle spasm  do not keep him up at night     He ended up getting a new puppy, he additionally has a 6 year ol dog.     Still no symptoms of heart     He notes that he has not started the cholesterol medication yet due to an acute     ROS:     Review of systems negative unless otherwise noted as per HPI    Vital Signs:     Wt Readings from Last 3 Encounters:   03/01/22 (!) 142 kg (313 lb)   11/29/21 (!) 147.7 kg (325 lb 11.2 oz)   11/21/21 (!) 148.9 kg (328 lb 4.2 oz)     Temp Readings from Last 3 Encounters:   03/01/22 37.1 ??C (98.7 ??F) (Temporal)   01/16/22 36.8 ??C (98.3 ??F) (Temporal)   11/29/21 36.8 ??C (98.3 ??F) Vitamin D noted to be low.   -He is following with Complex case management, Psychiatry, Hematology/Oncology, Endocrinology, ENT, GI    To follow up in 3 months to reassess cholesterol        Subjective:     Kenneth Fitzgerald [facility] y.o.male  is here for follow up     He started a new job at a [employer], working 25 hours a week   He started the process to get disability   He has been walking more  He is having some swelling of his left hand/wrist from his cancer medication but is not having significant pain.   He is not having as many/severe muscle spasms as noted previously.   The muscle spasms do not keep him up at night either     He ended up getting a new puppy, he additionally has a 6 year ol dog.  ROS:     Review of systems negative unless otherwise noted as per HPI  Denies: Chest pain, palpitations, shortness of breath, lightheadedness, dizziness, shortness of breath, syncope/presyncope    Vital Signs:     Wt Readings from Last 3 Encounters:   03/01/22 (!) 142 kg (313 lb)   11/29/21 (!) 147.7 kg (325 lb 11.2 oz)   11/21/21 (!) 148.9 kg (328 lb 4.2 oz)     Temp Readings from Last 3 Encounters:   03/01/22 37.1 ??C (98.7 ??F) (Temporal)   01/16/22 36.8 ??C (98.3 ??F) (Temporal)   11/29/21 36.8 ??C (98.3 ??F) (Temporal)     BP Readings from Last 3 Encounters:   03/01/22 140/88   01/16/22 136/91   11/29/21 141/94     Pulse Readings from Last 3 Encounters:   03/01/22 86   01/16/22 103   11/29/21 87       Objective:        General Appearance: Alert, cooperative, no distress  EYES: EOMI  CV: Regular rate and rhythm.  RESP: Normal respiratory effort.  Clear to auscultation bilaterally without wheezes, rhonchi or crackles.   EXT:  No lower extremity edema.   MSK: gait is stable, able to get on/off exam table without difficulty.   SKIN: no suspicious lesions or rashes noted on exposed skin  NEURO: Cranial nerves II- XII grossly intact.  No focal neurologic deficits. AOx3  PSYCHIATRIC: Pleasant

## 2022-03-04 ENCOUNTER — Telehealth: Admit: 2022-03-04 | Discharge: 2022-03-05 | Payer: PRIVATE HEALTH INSURANCE

## 2022-03-04 DIAGNOSIS — F411 Generalized anxiety disorder: Principal | ICD-10-CM

## 2022-03-04 DIAGNOSIS — F3341 Major depressive disorder, recurrent, in partial remission: Principal | ICD-10-CM

## 2022-03-04 DIAGNOSIS — C801 Malignant (primary) neoplasm, unspecified: Principal | ICD-10-CM

## 2022-03-04 MED ORDER — DULOXETINE 30 MG CAPSULE,DELAYED RELEASE
ORAL_CAPSULE | 0 refills | 0 days | Status: CP
Start: 2022-03-04 — End: ?

## 2022-03-04 NOTE — Unmapped (Signed)
Comprehensive Cancer Support Program (CCSP) - Psychiatry Outpatient Clinic   After Visit Summary    It was a pleasure to see you today in the [facility] Comprehensive Cancer Support Program (CCSP). The CCSP is a multidisciplinary program dedicated to helping patients, caregivers, and families with cancer treatment, recovery and survivorship.      To schedule, cancel, or change your appointment:  Please call the [facility] schedulers at [phone], Monday through Friday 8AM - 5PM.  Someone will return your call within 24 hours.      If you have a question about your medicines or you need to contact your provider:  First, try sending a My Chart message to Navon Kotowski, PMHNP. If you are unable to do so, please call the CCSP program coordinator, Devon Suitt, at [phone].     For after hours urgent issues, you may call [phone] or call the "I need to talk" line at 1-800-273-TALK (8255) anytime 24/7.    CCSP Patient and Family Resource Center: [phone].    CCSP Website:  [email or web address]    For prescription refills, please allow at least 24 hours (during business hours, M-F) for providers to call in refills to your pharmacy. We are generally unable to accommodate same-day requests for refills.     If you are taking any controlled substances (such as anxiety or sleep medications), you must use them as the directions say to use them. We generally do not provide early refills over the phone without clear reason, and it would be inappropriate to obtain the medications from other doctors. We routinely use the  controlled substance database to monitor prescription drug use.

## 2022-03-04 NOTE — Unmapped (Signed)
[facility]  Psychiatry--Comprehensive Cancer Support Program   Established Patient E&M Service - Outpatient       Assessment:    Kenneth Fitzgerald [facility] presents for follow-up evaluation. He states that he is sleeping well at night, he experiences intermittent episodes of low mood (mild in severity), and excessive anxiety with intermittent episodes of ruminations. He has not been taking duloxetine since last fall and he only uses his mirtazapine as needed (has not used mirtazapine in the past 6 weeks because he has been sleeping well). Discontinuing his mirtazapine and re-starting his duloxetine at 30 mg daily for 2 weeks followed by 60 mg daily thereafter. He continues to engage in psychotherapy with [name], PhD. Will plan to reassess in 4 weeks.     Identifying Information:  Kenneth Fitzgerald is a 47 y.o. 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     Risk Assessment:  A suicide and violence risk assessment was performed as part of this evaluation. There patient is deemed to be at chronic elevated risk for self-harm/suicide given the following factors: current diagnosis of depression. The patient is deemed to be at chronic elevated risk for violence given the following factors: male gender. These risk factors are mitigated by the following factors:lack of active SI/HI, no history of previous suicide attempts , no history of violence, motivation for treatment, employment or functioning in a structured work/academic setting, enjoyment of leisure actvities, expresses purpose for living, current treatment compliance, effective problem solving skills, and safe housing. There is no acute risk for suicide or violence at this time. The patient was educated about relevant modifiable risk factors including following recommendations for treatment of psychiatric illness and abstaining from substance abuse.    While future psychiatric events cannot be accurately predicted, the patient does not currently require acute inpatient psychiatric care and does not currently meet [facility] involuntary commitment criteria.      Plan:    Problem 1: Recurrent major depressive disorder  Status of problem: chronic with mild exacerbation  Interventions:   Re-start duloxetine 30 mg once daily for 2 weeks, then increase to 30 mg BID thereafter  Stop mirtazapine 7.5 mg. He has not been taking this medication consistently and has only been using this medication for insomnia, which is no longer a problem.  Continue psychotherapy with [name], PhD    Problem 2: Anxiety   Status of problem:  chronic with mild to moderate exacerbation  Interventions:   See MDD plan above        Psychotherapy provided:  No billable psychotherapy service provided.    Patient has been given this writer's contact information as well as the [facility] Psychiatry urgent line number. The patient has been instructed to call 911 for emergencies.      Subjective:    Chief complaint:  Follow-up psychiatric evaluation for depression and anxiety    Interval History:   Today he is endorsing intermittent depressed mood (mild in severity), feelings of worthlessness at times, fatigue, muscle tension, excessive anxiety and worry, ruminations at times, restlessness at times, irritability at times, and racing thoughts at night at times. He denies anhedonia, decreased motivation, feelings of hopelessness, difficulty  concentrating, panic attacks, mania, psychosis, suicidal ideation, and alcohol/drug use. He recently started a new part-time job, which has contributed to recent improvement in his mood. He finds psychotherapy to be beneficial.  He has not been using his mirtazapine in the past 6 weeks because he is sleeping well at night.     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 He endorses upsetting memories of the traumas, dissociations at times, and feeling emotionally upset when reminded of the traumas. He denies nightmares, physical reactions to memories of the traumas, avoiding activities or people that remind him of the trauma, feeling emotionally numb, hypervigilance, significant irritability or bursts of anger, and startling easily. He likely does not suffer from PTSD.    Prior Psychiatric Medication Trials: bupropion, lorazepam, melatonin, sertraline, mirtazapine (helpful for sleep)    Psychiatric Review of Systems  Depressive Symptoms: fatigue and intermittent depressed mood and feelings of worthlessness at times  Manic Symptoms:  denies  Psychosis Symptoms: N/A  Anxiety Symptoms: excessive anxiety and worry, difficulty controlling worry, restlessness at times, irritability at times, racing thoughts at times    Obsession/Compulsions: N/A  Panic Symptoms:  denies  Trauma Symptoms: upsetting memories of the traumas, dissociations at times, and feeling emotionally upset when reminded of the traumas  Sleep disturbance:  denies    Appetite: good      Objective:      Mental Status Exam:  Appearance:    Appears stated age, Well nourished, and Clean/Neat   Motor:   No abnormal movements   Speech/Language:    Normal rate, volume, tone, fluency   Mood:    OK   Affect:   Calm, Cooperative, and Euthymic   Thought process and Associations:   Logical, linear, clear, coherent, goal directed   Abnormal/psychotic thought content:     Denies SI, HI, self harm, delusions, obsessions, paranoid ideation, or ideas of reference   Perceptual disturbances:     Denies auditory and visual hallucinations, behavior not concerning for response to internal stimuli     Other:   Insight and judgment intact     I personally spent 54 minutes face-to-face and non-face-to-face in the care of this patient, which includes all pre, intra, and post visit time on the date of service.    Visit was completed by video (or phone) and the appropriate disclaimer has been included below.    The patient reports they are physically located in [place] and is currently: at home. I conducted a audio/video visit. I spent  12m 41s on the video call with the patient. I spent an additional 30 minutes on pre- and post-visit activities on the date of service .       Sheran Lawless, PMHNP  03/04/2022

## 2022-03-11 NOTE — Unmapped (Signed)
[facility] Refill Coordination Note    Specialty Medication(s) to be Shipped:   Hematology/Oncology: Braftovi 75mg  and [name] 15mg     Other medication(s) to be shipped: No additional medications requested for fill at this time     Koren Iaquinto, DOB: 20-Oct-1975  Phone: [phone] (home)       All above HIPAA information was verified with patient.     Was a Nurse, learning disability used for this call? No    Completed refill call assessment today to schedule patient's medication shipment from the [facility] [phone]).  All relevant notes have been reviewed.     Specialty medication(s) and dose(s) confirmed: Regimen is correct and unchanged.   Changes to medications: Surafel reports no changes at this time.  Changes to insurance: No  New side effects reported not previously addressed with a pharmacist or physician: None reported  Questions for the pharmacist: No    Confirmed patient received a Conservation officer, historic buildings and a Surveyor, mining with first shipment. The patient will receive a drug information handout for each medication shipped and additional FDA Medication Guides as required.       DISEASE/MEDICATION-SPECIFIC INFORMATION        N/A    SPECIALTY MEDICATION ADHERENCE     Medication Adherence    Patient reported X missed doses in the last month: 0  Specialty Medication: BRAFTOVI 75 mg capsule (encorafenib)  Patient is on additional specialty medications: Yes  Additional Specialty Medications: [name] 15 mg tablet (binimetinib)  Patient Reported Additional Medication X Missed Doses in the Last Month: 0  Patient is on more than two specialty medications: No  Informant: patient              Were doses missed due to medication being on hold? No    Braftovi 75 mg: 7 days of medicine on hand   [name] 15 mg: 7 days of medicine on hand       REFERRAL TO PHARMACIST     Referral to the pharmacist: Not needed      [facility]     Shipping address confirmed in Epic.     Patient was notified of new phone menu : No    Delivery Scheduled: Yes, Expected medication delivery date: 03/13/22.     Medication will be delivered via UPS to the prescription address in Epic WAM.    [name]   [facility] Specialty Technician

## 2022-03-12 MED FILL — BRAFTOVI 75 MG CAPSULE: ORAL | 30 days supply | Qty: 180 | Fill #4

## 2022-03-12 MED FILL — MEKTOVI 15 MG TABLET: ORAL | 30 days supply | Qty: 180 | Fill #4

## 2022-03-14 ENCOUNTER — Telehealth: Admit: 2022-03-14 | Discharge: 2022-03-15 | Payer: PRIVATE HEALTH INSURANCE | Attending: Clinical | Primary: Clinical

## 2022-03-14 DIAGNOSIS — F411 Generalized anxiety disorder: Principal | ICD-10-CM

## 2022-03-14 DIAGNOSIS — C801 Malignant (primary) neoplasm, unspecified: Principal | ICD-10-CM

## 2022-03-14 DIAGNOSIS — F331 Major depressive disorder, recurrent, moderate: Principal | ICD-10-CM

## 2022-03-14 NOTE — Unmapped (Signed)
[facility]  Psychiatry  Psychotherapy Note - Telehealth via Video     Service Date: March 14, 2022  Service: 46 minutes of psychotherapy via video session  Time with Patient: 46 minutes     Encounter Description: This encounter was conducted from provider's office via EPIC video session due to COVID-19 pandemic. Kenneth Fitzgerald was located in his home. Session was conducted via EPIC.  Rationale for video session is need to social distance. See Plan for telemedicine consent/disclaimer.      Encounter Description/Consent:   Kenneth Fitzgerald visit was completed through telehealth encounter (video).      This patient encounter is appropriate and reasonable under the circumstances. The patient has been advised of the potential risks and limitations of this mode of treatment (including, but not limited to, the absence of in-person examination) and has agreed to be treated in a remote fashion in spite of them. Any and all of the patient's/patient's family's questions on this issue have been answered.      The patient was physically located in [place] in which I am permitted to provide care. The patient understood that he may incur co-pays and cost sharing, and agreed to the telemedicine visit. The visit was reasonable and appropriate under the circumstances given the patient's presentation at the time.      Time Spent: 46 minutes     Assessment:  Kenneth Fitzgerald is a 47yo male with a history of metastatic melanoma and thyroid cancer who I have resumed seeing for psychotherapy to address depression and adjustment to multiple stressors.  We met today via telehealth.  Mr. Casucci reported sustained modest improvement in mood, but still endorsed some depressive and anxiety symptoms related to work and health stressors.  He met recently with [name], NP and has re-started on Cymbalta.  I offered psychotherapy, support, recommendations for addressing distress.  Will continue to follow.     Risk Assessment:  A suicide and violence risk assessment was performed as part of this evaluation. There patient is deemed to be at chronic elevated risk for self-harm/suicide given the following factors: current diagnosis of depression, suicidal ideation or threats without a plan and past diagnosis of depression. There patient is deemed to be at chronic elevated risk for violence given the following factors: male gender. These risk factors are mitigated by the following factors: lack of active SI/HI. There is no acute risk for suicide or violence at this time. The patient was educated about relevant modifiable risk factors including following recommendations for treatment of psychiatric illness and abstaining from substance abuse. While future psychiatric events cannot be accurately predicted, the patient does not currently require acute inpatient psychiatric care and does not currently meet [facility] involuntary commitment criteria.              Plan:  1. Will continue to see Mr. Sazama for psychotherapy, scheduled to meet next month.  2. Mr. Bonfante is now followed by [name], NP.  3. Mr. Bloyd has information on available CCSP resources and he has worked with [name], [name].  I have previously reached out to [name] to explore financial resources.  4. Mr. Haroldson knows to reach me as needed.     Subjective:   Mr. Inglett presented as alert, oriented, and he was engaged.  His affect was fair.     Mr. Dennington sustained modest improvement in mood and anxiety in the time since our last session.  He endorsed experiencing depressed mood  and ruminating at times while reiterating that severity is better than weeks ago.  He is enjoying his part-time job at an [employer] where - in contrast to his previous job - he feels valued and appreciated.  Although it has come with a considerable reduction in pay, Mr. Henault is enjoying the work.  As before, he enied any thoughts of self-harm or emergent psych concerns.  He does continue to contend with stressors including his health status and past legal issues.     Mr. Pageau met with [name], NP last week and restarted on Cymbalta.  He continues to take Synthroid.     I provided Mr. Serpe support, understanding, praise, and psychoeducation.  We discussed and processed his thoughts, feelings, and experiences - and reviewed/problem-solved ways to tolerate depressive and anxiety symptoms.  Mr. Sabra was engaged and receptive.  My sense is that therapy is beneficial for him and we scheduled to meet next month.     Mental Status Exam:  Appearance:     Appears stated age   Motor:    No abnormal movements   Speech/Language:     Normal rate, volume, tone, fluency   Mood:   okay   Affect:    Calm; cooperative   Thought process:    Logical, linear, clear, coherent, goal-directed   Thought content:      Denies SI/HI or thought of self-harm   Perceptual disturbances:      Behavior not concerning for response to internal stimuli      Orientation:    Oriented to person, place, time, and general circumstances   Attention:    Able to fully attend without fluctuations in consciousness   Concentration:    Able to fully concentrate and attend   Memory:    Immediate, short-term, long-term, and recall grossly intact    Fund of knowledge:     Consistent with level of education and development   Insight:      Intact   Judgment:     Intact   Impulse Control:    Intact      Diagnosis: Recurrent major depressive disorder, moderate;  Anxiety associated with cancer diagnosis     [name]. Avabella Wailes PhD  March 14, 2022

## 2022-03-19 ENCOUNTER — Telehealth: Admit: 2022-03-19 | Discharge: 2022-03-20 | Payer: PRIVATE HEALTH INSURANCE

## 2022-03-19 NOTE — Unmapped (Addendum)
Work to gradually increase water intake by adding an additional bottle of water.  Aim to have at least 1 vegetable with each dinner.  With breakfast, add one serving of fruit.

## 2022-03-19 NOTE — Unmapped (Unsigned)
Nutrition Consult Note    PCP: Mangel, Benison Pap, DO  Address on File: [address], [place] [address], [place]      Verified as Current Location: Yes  Extended Emergency Contact Information  Primary Emergency Contact: Huntley, Tammy  Mobile Phone: [phone]  Relation: Sister  Preferred language: ENGLISH  Interpreter needed? No  Secondary Emergency Contact: Giuliano,Jay   [place]  Home Phone: [phone]  Relation: Brother     Verified Behavioral Health Emergency Contact: Primary      The patient reports they are physically located in [place] and is currently: not at home. I conducted a audio/video visit. I spent  83m 55s on the video call with the patient. I spent an additional 10 minutes on pre- and post-visit activities on the date of service .      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)    Malignant neoplasm of thyroid gland (CMS-HCC)    Postoperative hypothyroidism    Hypocalcemia    Thyroid cancer (CMS-HCC)    Primary melanoma of head and neck (CMS-HCC)    Ventral hernia with bowel obstruction    Insomnia    Crohn's disease of large intestine without complication (CMS-HCC)    Gout    01/24/2020: Open, primary ventral hernia repair for SBO    Morbid obesity with BMI of 40.0-44.9, adult (CMS-HCC)    Nontoxic uninodular goiter    MDD (major depressive disorder), recurrent episode, severe (CMS-HCC)    Lumbosacral spondylosis without myelopathy    Diverticulitis of colon (without mention of hemorrhage)(562.11)    Depressive disorder    Diverticulosis of colon    Iron deficiency anemia    Suicidal ideation    Atherosclerotic cardiovascular disease    HLD (hyperlipidemia)    Anxiety associated with cancer diagnosis (CMS-HCC)    Recurrent major depressive disorder in partial remission (CMS-HCC) Present height    Present weight    Current BMI      Weight Status Categories:  Underweight:  BMI < 18.5  Normal Weight:  BMI 18.5 - 24.9  Overweight:  BMI 25 - 29.9  Obesity Class I:  BMI 30 - 34.9  Obesity Class II:  BMI 35 - 39.9  Obesity Class III:  BMI ? 40        Weight History:  Wt Readings from Last 6 Encounters:   03/01/22 (!) 142 kg (313 lb)   11/29/21 (!) 147.7 kg (325 lb 11.2 oz)   11/21/21 (!) 148.9 kg (328 lb 4.2 oz)   10/31/21 (!) 148.1 kg (326 lb 9.6 oz)   10/18/21 (!) 149.2 kg (329 lb)   10/10/21 (!) 147.2 kg (324 lb 9.6 oz)       Allergies:   Allergies   Allergen Reactions    Compazine [Prochlorperazine] Itching    Coconut Nausea And Vomiting       Relevant Medications, Herbs, Supplements:    Current Outpatient Medications:     acetaminophen (TYLENOL) 500 MG tablet, Take 2 tablets (1,000 mg total) by mouth every eight (8) hours as needed for pain., Disp: 30 tablet, Rfl: 0    binimetinib ([name]) 15 mg tablet, Take 3 tablets (45 mg total) by mouth two (2) times a day., Disp: 180 tablet, Rfl: 11  blood sugar diagnostic Strp, Dispense 100 blood glucose test strips, ok to sub any brand preferred by insurance/patient, use 3x/day; dispense whatever brand matches with meter., Disp: 100 strip, Rfl: 12    blood-glucose meter kit, Use as instructed; dispense 1 meter, whatever is preferred by insurance, Disp: 1 each, Rfl: 1    calcitriol (ROCALTROL) 0.25 MCG capsule, Take 1 capsule (0.25 mcg total) by mouth daily., Disp: 90 capsule, Rfl: 3    calcium carbonate 650 mg calcium (1,625 mg) tablet, Take 1 tablet (650 mg of elem calcium total) by mouth Three (3) times a day with a meal., Disp: , Rfl:     DULoxetine (CYMBALTA) 30 MG capsule, Take 1 capsule daily for 2 weeks, then increase to 1 capsule twice daily thereafter, Disp: 46 capsule, Rfl: 0    encorafenib (BRAFTOVI) 75 mg capsule, Take 6 capsules (450 mg total) by mouth daily., Disp: 180 capsule, Rfl: 11    fexofenadine (ALLEGRA ALLERGY) 180 MG tablet, Take 1 tablet (180 mg total) by mouth daily as needed (allergies)., Disp: , Rfl:     fluticasone propionate (FLONASE) 50 mcg/actuation nasal spray, 1 spray into each nostril daily., Disp: , Rfl:     folic acid (FOLVITE) 1 MG tablet, TAKE 1 TABLET BY MOUTH EVERY DAY, Disp: 90 tablet, Rfl: 2    guaiFENesin (MUCINEX) 600 mg 12 hr tablet, Take 1 tablet (600 mg total) by mouth every twelve (12) hours., Disp: , Rfl:     ibuprofen (ADVIL,MOTRIN) 200 MG tablet, Take 3 tablets (600 mg total) by mouth daily as needed for pain., Disp: , Rfl:     lancets Misc, Dispense 100 lancets, ok to sub any brand preferred by insurance/patient, use 3x/day, Disp: 100 each, Rfl: 12    levothyroxine (SYNTHROID) 125 MCG tablet, Take 375 mcg daily; please dispense 90-day supply, Disp: 270 tablet, Rfl: 3    memantine (NAMENDA) 10 MG tablet, Take 1 tablet (10 mg total) by mouth Two (2) times a day., Disp: 60 tablet, Rfl: 6    ofloxacin (OCUFLOX) 0.3 % ophthalmic solution, Instill 4 drops in left ear twice daily for 5 days (Patient not taking: Reported on 03/01/2022), Disp: 5 mL, Rfl: 3    ondansetron (ZOFRAN) 8 MG tablet, Take 1 tablet (8 mg total) by mouth Take as directed. (Patient not taking: Reported on 03/01/2022), Disp: , Rfl:     rosuvastatin (CRESTOR) 10 MG tablet, Take 1 tablet (10 mg total) by mouth nightly. (Patient not taking: Reported on 03/01/2022), Disp: 90 tablet, Rfl: 1    sulfaSALAzine (AZULFIDINE) 500 mg tablet, Take 4 tablets (2,000 mg total) by mouth two (2) times a day., Disp: 240 tablet, Rfl: 11    Do you have any concerns about taking or affording your medications? If Yes, consider [facility] referral ([id number])  No concerns      Relevant Labs:  HGB A1C, POC (%)   Date Value   08/30/2019 5.4     Hemoglobin A1C (%)   Date Value   05/10/2020 5.4   01/03/2020 5.8 (H)   06/22/2018 6.0 (H)   09/22/2012 5.2      No components found for: LDLCALC   BP Readings from Last 3 Encounters:   03/01/22 140/88   01/16/22 136/91   11/29/21 141/94     Lab Results   Component Value Date    CHOL 275 (H) 03/01/2022    CHOL 254 (H) 10/31/2021     Lab Results   Component Value Date  HDL 34 (L) 03/01/2022    HDL 37 (L) 10/31/2021     Lab Results   Component Value Date    LDL 176 (H) 03/01/2022    LDL 161 (H) 10/31/2021     Lab Results   Component Value Date    VLDL 65.4 (H) 03/01/2022    VLDL 56 (H) 10/31/2021     Lab Results   Component Value Date    CHOLHDLRATIO 8.1 (H) 03/01/2022    CHOLHDLRATIO 6.9 (H) 10/31/2021     Lab Results   Component Value Date    TRIG 327 (H) 03/01/2022    TRIG 280 (H) 10/31/2021    TRIG 200 (H) 01/27/2020       Lab Results   Component Value Date    VITDTOTAL 17.4 (L) 01/16/2022     Lab Results   Component Value Date    VITAMINB12 509 01/16/2022         Physical Activity:  Type: walking   Some limited activity- notes medication side effect of joint inflammation  Has been walking more- 6 days per week- active at new job, walking frequently    24-Hour recall/usual intake:    Time Intake   Breakfast Frozen biscuit  OR eggs with bacon with 16oz milk (1% choc + 1% reg mixture)  Cereal OR bagel with cream cheese OR muffin and yogurtt   Snack (AM)    Lunch PB& J, [place] sandwich OR 1 hot pocket OR soup/stew (homemade) OR salad with protein     Snack (PM)    Dinner [employer] OR takeout (divides into 2-3 meals)  Spaghetti; roast beef, green beans, corn, sliced potatoes     Snack (HS) With medicine at night- 4-5 oz milk (1% choc + 1% reg mixture)     Other Nutrition Information:  Reviewed usual intakes and activity patterns since last visit with pt along with progress on previous goals.    Pt reported updates since last visit:  -has started statin for elevated cholesterol- states he notices some general difficulty with sleep- insomnia a few times per week; not sure if it's connected  -working to increase fruit and vegetables; did some tracking for a few weeks- states he got frustrated- could never stay under calorie goal 2100cal; states avg intake 2500-2600  - states it it more feasible for him to have higher intakes at this time  -up to 32 oz 1% milk; working to increase water- yesterday ~80 oz  -discussed snacks      12/28/21:  PATIENT STATED FOCUS: pt states he had thyroid removed 9 years ago and has been unable to lose weight; in the last 6 months has gained 5-10 lbs;  In the past has worked on portion control, decreasing calories and increased exercise; notes no sustainable weight loss  -does not check weight at home; usually at doc appts; states clothes are fitting looser since last month  -since Jan has removed regular soda- is having zero sugar soda    Reviewed usual intakes and activity patterns. Answered questions from pt.  Supportive listening provided and motivational interviewing conducted.      HISTORY   Cancer- stage 4 melanoma; previous stage 3 thyroid; many recurrances- brain chest, abdomen; taking many medications; last radiation treatment Jan 2022  Calcium deficiency- drinks lot of milk        MEAL PATTERN   3 meals    USUAL INTAKES  Dairy: 1% milk, Greek yogurt, cottage cheese, and cheese  Fruit: <1 serving  per day 1-2 servings per week  Vegetables: <1 serving per day notes recent decreases  Starches: cereal (froot loops, frosted flakes, rice krispies, honey nut cheerios), bread (white and whole wheat), pasta (white), and rice (white)  Protein: chicken/poultry, beef, fish, eggs, nut butters, and beans  Snacks / Sweets: varies  Oils / Fats: olive oil and canola oil  Sodium: does not add salt to foods and frequent intake of fast food/processed foods        PORTIONS    Pt reports they have improved the last few months; when eating out takes half home      BEVERAGES   milk (1% choc + 1% reg mixture)  Water- 24-32oz  Zero sugar soda- no more than 1 bottle per day; not daily; recently reduced intakes     COOKING   Lives alone, likes cooking when has time     DINING OUT   2-3 times per week     Alcohol: 1 drink per month  Tobacco: none Food Insecurity: No Food Insecurity (11/05/2021)    Hunger Vital Sign     Worried About Running Out of Food in the Last Year: Never true     Ran Out of Food in the Last Year: Never true          Estimated Daily Needs:  2400 calories (Mifflin-St Jeor equation for BMI > 25 using actual body weight, adjusted for weight loss)  118-147 g protein      Nutrition Diagnosis:  Overweight/obesity related to decreased energy expenditure due to sedentary lifestyle as evidenced by pt report, BMI > 30    Nutrition Intervention:  Nutrition Counseling    Materials Provided: sent via mychart  Snacks    Social Determinants of Health interventions provided: N/A - no concerns identified.     Patient Stated Nutrition Goals:  Work to gradually increase water intake by adding an additional bottle of water.  Aim to have at least 1 vegetable with each dinner.  With breakfast, add one serving of fruit.     Goals Added to Visit Navigator?  Yes    Monitoring/Evaluation:  Progress towards goals:  continue    Follow-up:  5 weeks- 04/19/22     Length of visit was 22  minutes  1 unit(s) billed per insurance    Carla Drape, MPH, RD, LDN Yes    Monitoring/Evaluation:  Progress towards goals:  continue    Follow-up:  5 weeks- ***     Length of visit was ***  minutes  1 unit(s) billed per insurance    Carla Drape, MPH, RD, LDN

## 2022-04-01 ENCOUNTER — Telehealth: Admit: 2022-04-01 | Discharge: 2022-04-02 | Payer: PRIVATE HEALTH INSURANCE

## 2022-04-01 DIAGNOSIS — C801 Malignant (primary) neoplasm, unspecified: Principal | ICD-10-CM

## 2022-04-01 DIAGNOSIS — F411 Generalized anxiety disorder: Principal | ICD-10-CM

## 2022-04-01 DIAGNOSIS — F3341 Major depressive disorder, recurrent, in partial remission: Principal | ICD-10-CM

## 2022-04-01 MED ORDER — DULOXETINE 30 MG CAPSULE,DELAYED RELEASE
ORAL_CAPSULE | Freq: Two times a day (BID) | ORAL | 11 refills | 30 days | Status: CP
Start: 2022-04-01 — End: ?

## 2022-04-01 NOTE — Unmapped (Signed)
[facility]  Psychiatry--Comprehensive Cancer Support Program   Established Patient E&M Service - Outpatient       Assessment:    Kenneth Fitzgerald [facility] presents for follow-up evaluation. He states that the duloxetine 30 mg BID is providing some relief in his mood and anxiety, but he is not sleeping well now and attributes this insomnia to an issue tolerating a new statin medication. Patient is not interested in re-starting his mirtazapine to help with sleep and plans to talk with his PCP about changing his statin medication. He continues to participate in psychotherapy with [name], PhD. Will plan to reassess in 6 weeks to see if improved sleep further stabilizes his mood and anxiety.    Identifying Information:  Kenneth Fitzgerald is a 47 y.o. 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      Risk Assessment:  An assessment of suicide and violence risk factors was performed as part of this evaluation and is not significantly increased from the last visit.   While future psychiatric events cannot be accurately predicted, the patient does not currently require acute inpatient psychiatric care and does not currently meet [facility] involuntary commitment criteria.      Plan:    Problem 1: Recurrent major depressive disorder  Status of problem: chronic with mild exacerbation  Interventions:   Continue duloxetine 30 mg BID  Continue psychotherapy with [name], PhD     Problem 2: Anxiety   Status of problem:  chronic with mild exacerbation  Interventions:   See MDD plan above      Psychotherapy provided:  No billable psychotherapy service provided.    Patient has been given this writer's contact information as well as the [facility] urgent line number. The patient has been instructed to call 911 for emergencies.      Subjective:    Chief complaint: Follow-up psychiatric evaluation for depression and anxiety    Interval History:   No issues tolerating the duloxetine 30 mg BID. Some improvement noted in mood and anxiety, but some personal issues have resurfaced which are contributing to his low mood and anxiety (he did not elaborate on what these personal issues are). Endorsing intermittent low mood, decreasing anxiety, and ruminations at times.     Started a new statin (prescribed by PCP) around the same time that he re-started the duloxetine and now he is struggling with insomnia. He believes that the statin is the reason for the insomnia and plans to get up with his PCP to see if this medication can change. I offered to re-start his mirtazapine, but he did not want to take any medication for sleep and feels that changing the statin will help. We discussed the importance of sleep and how insomnia can affect his mood and anxiety.       Prior Psychiatric Medication Trials: bupropion, lorazepam, melatonin, sertraline, mirtazapine (helpful for sleep)     Psychiatric Review of Systems  Depressive Symptoms: fatigue and intermittent depressed mood (Improving)  Manic Symptoms:  denies  Psychosis Symptoms: N/A  Anxiety Symptoms: excessive anxiety and worry improving, difficulty controlling worry improving, restlessness at times, irritability at times, racing thoughts at times    Obsession/Compulsions: N/A  Panic Symptoms:  denies  Trauma Symptoms: upsetting memories of the traumas, dissociations at times, and feeling emotionally upset when reminded of the traumas  Sleep disturbance:  difficulty falling  asleep and staying asleep. He states that he tosses and turns   Appetite: good  Exercise: States that he gets exercise at work because he is on his feet all day.      Objective:      Mental Status Exam:  Appearance:    Appears stated age, Well nourished, and Clean/Neat   Motor:   No abnormal movements   Speech/Language:    Normal rate, volume, tone, fluency   Mood: OK   Affect:   Calm, Cooperative, and Euthymic   Thought process and Associations:   Logical, linear, clear, coherent, goal directed   Abnormal/psychotic thought content:     Denies SI, HI, self harm, delusions, obsessions, paranoid ideation, or ideas of reference   Perceptual disturbances:     Denies auditory and visual hallucinations, behavior not concerning for response to internal stimuli     Other:   Insight and judgment intact     I personally spent 27 minutes face-to-face and non-face-to-face in the care of this patient, which includes all pre, intra, and post visit time on the date of service.    Visit was completed by video (or phone) and the appropriate disclaimer has been included below.    The patient reports they are physically located in [place] and is currently: at home. I conducted a audio/video visit. I spent  23m 55s on the video call with the patient. I spent an additional 15 minutes on pre- and post-visit activities on the date of service .       Sheran Lawless, PMHNP  04/01/2022

## 2022-04-01 NOTE — Unmapped (Signed)
Comprehensive Cancer Support Program (CCSP) - Psychiatry Outpatient Clinic   After Visit Summary    It was a pleasure to see you today in the [facility] Support [facility] (CCSP). The CCSP is a multidisciplinary program dedicated to helping patients, caregivers, and families with cancer treatment, recovery and survivorship.      To schedule, cancel, or change your appointment:  Please call the [facility] schedulers at [phone], Monday through Friday 8AM - 5PM.  Someone will return your call within 24 hours.      If you have a question about your medicines or you need to contact your provider:  First, try sending a My Chart message to Alesandro Stueve, PMHNP. If you are unable to do so, please call the CCSP program coordinator, Devon Suitt, at [phone].     For after hours urgent issues, you may call [phone] or call the "I need to talk" line at 1-800-273-TALK (8255) anytime 24/7.    CCSP Patient and Family Resource Center: [phone].    CCSP Website:  [email or web address]    For prescription refills, please allow at least 24 hours (during business hours, M-F) for providers to call in refills to your pharmacy. We are generally unable to accommodate same-day requests for refills.     If you are taking any controlled substances (such as anxiety or sleep medications), you must use them as the directions say to use them. We generally do not provide early refills over the phone without clear reason, and it would be inappropriate to obtain the medications from other doctors. We routinely use the [place] controlled substance database to monitor prescription drug use.

## 2022-04-03 ENCOUNTER — Ambulatory Visit: Admit: 2022-04-03 | Discharge: 2022-04-03 | Payer: PRIVATE HEALTH INSURANCE

## 2022-04-03 DIAGNOSIS — R739 Hyperglycemia, unspecified: Principal | ICD-10-CM

## 2022-04-03 DIAGNOSIS — E278 Other specified disorders of adrenal gland: Principal | ICD-10-CM

## 2022-04-03 DIAGNOSIS — E890 Postprocedural hypothyroidism: Principal | ICD-10-CM

## 2022-04-03 DIAGNOSIS — C73 Malignant neoplasm of thyroid gland: Principal | ICD-10-CM

## 2022-04-03 LAB — T4, FREE: FREE T4: 0.81 ng/dL — ABNORMAL LOW (ref 0.89–1.76)

## 2022-04-03 LAB — TSH: THYROID STIMULATING HORMONE: 4.582 u[IU]/mL (ref 0.550–4.780)

## 2022-04-03 NOTE — Unmapped (Signed)
Assessment/Plan:    1. T1N1b papillary thyroid cancer (classic/follicular variant), multifocal, s/p 3 surgeries and 2 doses of I-131. Last treatment was in 2015. Has indeterminate response to treatment based on persistent detectable Tg (< 1 on suppression, rose to 2.4 with stimulation in Jan 2018).  He also has stage IV melanoma, followed by oncology.  -Thyrogen-stimulated WBS was negative in 01/2016; stimulated Tg was 2.4  -Tg on suppression has been stable, with fluctuations commensurate with fluctuations in TSH, i.e.  Tg was 0.3 in 08/2019 (with TSH of 1.7) and was 0.6 in 09/2021 (with TSH of 9.045). Tg Ab has been negative. Repeat Tg/Ab next visit.  -12/2020 neck US stable. Had [place] again earlier today 04/03/22, fu on results once avaialable.    2. Hypothyroidism, with wide TSH fluctuations in TSH on similar doses of thyroid hormone, perhaps related to weight changes, absorption issues, and/or variable adherence.  TSH was normal in 2022 and 2023 while taking levothyroxine 375 mcg daily.    -TSH high 09/2021 on this dose re: missed ~ 8 days of medication --> improving in 10/2021 and 11/2021  -Repeat TSH and FT4 and adjust dose further if needed  -Following both TSH and FT4 re: brain XRT x 10 days from 12/2019-01/2020  -Reviewed appropriate administration, and factors that can affect dose needs or laboratory measurement    3. Hypocalcemia, severe, started calcitriol 2017. Low calcium in 2019 and in 2020 when off of calcitriol.  More recently, levels have been at or close to goal when he is taking calcitriol regularly and getting enough dietary or supplemental calcium.  -Calcium normal on multiple recent lab checks, no symptoms at present  -Continue calcitriol 0.25 mcg/day, and calcium 1200-1500 mg/day through combination of diet and supplements, difficult for him to take supplement regularly  -Reviewed goals of treatment and symptoms or signs with which to take extra calcium and/or notify us.     Return in about 6 months (around 10/04/2022).     Orders Placed This Encounter   Procedures    TSH    T4, Free             Reason For Visit:   Chief Complaint   Patient presents with    Follow-up thyroid cancer and hypoparathyroidism      Subjective:     History of Present Illness:  Kenneth Fitzgerald is a 47 y.o. male with PTC who was last seen 04/2020.    10/10/2021-visit, taking LT4 375 mcg daily (thyroid labs normal 12/2020 in 03/2021 on this dose), but he missed 8 days of medication earlier this month and had been back on for about 10 days at the time of the visit, TSH higher at 9.045/FT4 0.76/Tg 0.6 (c/t 0.3 in 2021), taking Calcitrol 0.25 mcg daily along with dietary/supplemental calcium and no hypocalcemia symptoms, Ca 7.9, ordered US for 12/2021, RTC 6 to 7 mo  10/18/21 - TSH 5.5, FT4 1.02, 11/2021 - TSH 4.6    Taking LT4 375 mcg daily. Good adherence.  He takes in the morning on an empty stomach without interfering medications or supplements. No overt hypothyroid or hyperthyroid symptoms. No neck pain, swelling, globus, dysphagia, or dysphonia.    Taking calcitriol 0.25 mcg/day. Was out for 2 weeks due to misplacing the medication, no symptoms, but did also increase Ca intake, back on calcitriol for a few days now. Taking prn supplemental calcium.  Dietary calcium - several servings. No kidney stones. No recent hypocalcemia symptoms but does keep extra calcium  with him to take if needed.    Weight has been coming down recently with smaller portions, healthier foods, and more activity with new job at [employer].    Patient started rosuvastatin with PCP recently, but noted restlessness and difficultly sleeping, so stopped it. He will be discussing with PCP and other care team members. Of note, he has baseline elevated CK in the several hundreds.      Weight trend:  Wt Readings from Last 6 Encounters:   04/03/22 (!) 142.5 kg (314 lb 3.2 oz)   03/01/22 (!) 142 kg (313 lb)   11/29/21 (!) 147.7 kg (325 lb 11.2 oz)   11/21/21 (!) 148.9 kg (328 lb 4.2 oz)   10/31/21 (!) 148.1 kg (326 lb 9.6 oz)   10/18/21 (!) 149.2 kg (329 lb)       PMH:  1. 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 Thyroidectomy 06/25/2011 with 1 foci each lobe and 2/2 central LN (left Level 6)     1b. Thyrogen-based RAI 125 mCi 03/2012 with pre/post scans showing fairly large focus of bed activity and 4.2% uptake; pretx stimulated Tg of 73     1c. Recurrence on [place] 09/2012 in left thyroid bed and confirmed by FNA 10/2012; left ND in 11/2012 levels 3-6 with 12/16 positive (largest 1.8 cm, and some with extranodal extension)     1d. Right CND 03/2013 levels 2-4 with 3/20 positive (largest 3.5 mm), found incidentally as the right ND was performed for melanoma recurrence which was 1 cm node in 1 of 20 nodes     1e. Thyrogen-based 150 mCi in 05/2013 [LID as well before the treatment]      45f. Thyrogen-stimulated Tg was 2.4 in 01/2016. WBS in 01/2016 was negative.  2. Postsurgical hypothyroidism  3. Melanoma - scalp lesion, surgery including RND, and systemic therapy ending 01/2012; recurrence in a 1 cm right LN removed 03/2013 on repeat RND; XRT completed 06/2013; whole brain XRT for brain med 12/2019-01/2020  4. Diverticulitis  5. H/o anklyosing spondylitis 2009  6.  Small bowel obstruction, hospitalization 11/2018, due to adhesions from past abdominal surgeries including hernia repairs  7. Hyperglycemia, new in 03/2018, in the setting of 4 weeks of prednisone to treat a reaction to immunotherapy.  A1c 6.0% in 06/2018. Took MTF short time. A1c improved in 08/2019. Has been off MTF since 12/2019.  -A1c normal 04/2020. Glucose values on recent panels have been normal.   8. Crohns, diagnosed 09/2019 by Dr. Stevphen Rochester in GI, but likely present since 2019 per note, defer immunosuppressive therapy re: active melanoma, start w sulfasalazine     PSH reviewed in Epic      Current Outpatient Medications:     acetaminophen (TYLENOL) 500 MG tablet, Take 2 tablets (1,000 mg total) by mouth every eight (8) hours as needed for pain., Disp: 30 tablet, Rfl: 0    binimetinib ([name]) 15 mg tablet, Take 3 tablets (45 mg total) by mouth two (2) times a day., Disp: 180 tablet, Rfl: 11    blood sugar diagnostic Strp, Dispense 100 blood glucose test strips, ok to sub any brand preferred by insurance/patient, use 3x/day; dispense whatever brand matches with meter., Disp: 100 strip, Rfl: 12    blood-glucose meter kit, Use as instructed; dispense 1 meter, whatever is preferred by insurance, Disp: 1 each, Rfl: 1    calcitriol (ROCALTROL) 0.25 MCG capsule, Take 1 capsule (0.25 mcg total) by mouth daily., Disp: 90 capsule,  Rfl: 3    calcium carbonate 650 mg calcium (1,625 mg) tablet, Take 1 tablet (650 mg of elem calcium total) by mouth Three (3) times a day with a meal., Disp: , Rfl:     DULoxetine (CYMBALTA) 30 MG capsule, Take 1 capsule (30 mg total) by mouth two (2) times a day., Disp: 60 capsule, Rfl: 11    encorafenib (BRAFTOVI) 75 mg capsule, Take 6 capsules (450 mg total) by mouth daily., Disp: 180 capsule, Rfl: 11    fexofenadine (ALLEGRA ALLERGY) 180 MG tablet, Take 1 tablet (180 mg total) by mouth daily as needed (allergies)., Disp: , Rfl:     fluticasone propionate (FLONASE) 50 mcg/actuation nasal spray, 1 spray into each nostril daily., Disp: , Rfl:     folic acid (FOLVITE) 1 MG tablet, TAKE 1 TABLET BY MOUTH EVERY DAY, Disp: 90 tablet, Rfl: 2    guaiFENesin (MUCINEX) 600 mg 12 hr tablet, Take 1 tablet (600 mg total) by mouth every twelve (12) hours., Disp: , Rfl:     ibuprofen (ADVIL,MOTRIN) 200 MG tablet, Take 3 tablets (600 mg total) by mouth daily as needed for pain., Disp: , Rfl:     lancets Misc, Dispense 100 lancets, ok to sub any brand preferred by insurance/patient, use 3x/day, Disp: 100 each, Rfl: 12    levothyroxine (SYNTHROID) 125 MCG tablet, Take 375 mcg daily; please dispense 90-day supply, Disp: 270 tablet, Rfl: 3    memantine (NAMENDA) 10 MG tablet, Take 1 tablet (10 mg total) by mouth Two (2) times a day., Disp: 60 tablet, Rfl: 6    ofloxacin (OCUFLOX) 0.3 % ophthalmic solution, Instill 4 drops in left ear twice daily for 5 days, Disp: 5 mL, Rfl: 3    ondansetron (ZOFRAN) 8 MG tablet, Take 1 tablet (8 mg total) by mouth Take as directed., Disp: , Rfl:     rosuvastatin (CRESTOR) 10 MG tablet, Take 1 tablet (10 mg total) by mouth nightly., Disp: 90 tablet, Rfl: 1    sulfaSALAzine (AZULFIDINE) 500 mg tablet, Take 4 tablets (2,000 mg total) by mouth two (2) times a day., Disp: 240 tablet, Rfl: 11      Allergies   Allergen Reactions    Compazine [Prochlorperazine] Itching    Coconut Nausea And Vomiting       ROS  No chest pain or SOB. No f/c.  No cough.  No fractures or falls. Remainder of 8 systems reviewed were negative except as noted in the history of present illness.    Social Hx:  -Working at [employer] since 08/03/22  -Nonsmoker    Family History   Problem Relation Age of Onset    Hyperthyroidism Mother     Osteoporosis Mother     Arrhythmia Mother     Squamous cell carcinoma Mother         basal cell vs squamous cell skin cancer    Coronary artery disease Father         s/p CABG    Diabetes Father     Hypertension Father     Prostate cancer Father     Colon cancer Paternal Grandmother     Thyroid disease Neg Hx    Father died Aug 02, 2017 with diabetes and heart disease  Mother died in 2012-08-02    Objective:     Physical Exam:  BP 138/91  - Pulse 70  - Temp 36.3 ??C (97.4 ??F)  - Ht 188 cm (6' 2)  - Wt (!) 142.5 kg (314  lb 3.2 oz)  - BMI 40.34 kg/m??    BP Readings from Last 3 Encounters:   04/03/22 138/91   03/01/22 140/88   01/16/22 136/91     General appearance - Pleasant, conversant, NAD. Generalized and abdominal obesity.   Eyes -  No stare or lid lag, no lid edema  Neck - Supple w/o palpable LAD, no masses appreciated. Well healed incision from thyroidectomy.and previous LND.   Lymphatics - no cervical or supraclavicular adenopathy appreciated   Resp - clear to auscultation bilaterally  CV - normal rate, regular rhythm  Neurological - no hand tremors, 2+ upper extremity DTRs  Extremities - peripheral pulses normal, no lower extremity edema  Skin - warm, dry, no acanthosis or rashes noted    Data Review:    TSH   Date Value   11/29/2021 4.608 uIU/mL   10/18/2021 5.590 uIU/mL (H)   10/10/2021 9.045 uIU/mL (H)   10/04/2013 0.11 u[iU]/mL (L)   06/28/2013 0.28 u[iU]/mL (L)   04/27/2013 0.09 u[iU]/mL (L)     Free T4 (ng/dL)   Date Value   16/10/9602 0.92   10/18/2021 1.02   10/10/2021 0.76 (L)   10/04/2013 1.55 (H)   06/28/2013 1.40   04/27/2013 1.69 (H)     THYROGLOBULIN AB (IU/mL)   Date Value   10/10/2021 <1.8   08/30/2019 <1.8   06/22/2018 <1.8     Thyroglobulin, Tumor Marker, IA (ng/mL)   Date Value   10/10/2021 0.6 (H)   08/30/2019 0.3 (H)   06/22/2018 0.2 (H)      Lab Results   Component Value Date    CALCIUM 9.0 01/16/2022    CALCIUM 8.7 01/16/2022    CALCIUM 9.0 11/29/2021    PHOS 4.3 10/31/2021    PHOS 5.8 (H) 05/10/2020    PHOS 4.9 02/01/2020    CREATININE 1.02 01/16/2022    CREATININE 1.03 01/16/2022    CREATININE 0.99 11/29/2021    VITDTOTAL 17.4 (L) 01/16/2022    VITDTOTAL 31.0 02/27/2017    VITDTOTAL 37 12/04/2015    PTH 9.9 (L) 12/04/2015    PTH 12.4 12/12/2014    PTH 12 04/27/2013     Lab Results   Component Value Date    A1C 5.4 05/10/2020     Calcium values while hospitalized in 06/2016 were < 7, had low ionized calcium as well.     01/2016 - Thyrogen stimulated testing  - Tg rose to 2.4    PATH from 03/2013 right neck dissection:  Amended: 03/31/2013 by Kirkland Hun, MD  Reason: Additional Information  Comment: The report is amended to add the results of a BRAF V600E immunostain.  The final diagnosis is unchanged.  Previous Signout Date: 03/30/2013  Diagnosis:  Lymph nodes, right cervical, levels 2-4, removal and partial parotidectomy  -Metastatic melanoma involving 1 out of 20 lymph nodes, with the largest diameter measuring 10 mm (1.0 cm) and no evidence of extracapsular extension (1/20)  -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, with the largest diameter measuring 3.5 mm and no evidence of extracapsular extension (3/20)   -BRAF V600E immunohistochemical staining is positive (3+ staining, 100% of cells) --> this was on LN with melanoma  -Benign parotid gland is also present  *Molecular testing:  RESULTS:  Gene Variants of Known Clinical Utility:   BRAF: No mutation detected (see note) --> A BRAF V600E immunohistochemical stain was performed on the tissue section of this case and was positive (see case  [id number]). Though within validated parameters, the tumor input for sequencing in this case was low. This could result in the V600E mutant allele frequency falling below the limit of detection for the assay. [Again on LN with melanoma]  KIT: No mutation detected     Radiology:    US neck 12/2020:    Narrative  EXAM: US SOFT TISSUE HEAD AND NECK  DATE: 01/03/2021 9:33 AM  ACCESSION: [id number]  DICTATED: 01/03/2021 9:44 AM  INTERPRETATION LOCATION: [facility]    CLINICAL INDICATION: 27 years year old Male: thyroid cancer  - C73 - Thyroid cancer (CMS - HCC)    COMPARISON: 10/04/2020 chest CT and 10/28/2018 thyroid ultrasound    TECHNIQUE:  Ultrasound views of the thyroid bed were obtained using gray scale and limited color Doppler imaging.    FINDINGS: Patient is status post total thyroidectomy. No residual thyroid tissue is visualized in the thyroidectomy bed.    Previously visualized heterogeneous nodule at the midline superior to the thyroidectomy bed is not visualized on today's examination.    No pathologically enlarged lymph nodes were identified throughout the neck.    Impression  1. Status post total thyroidectomy without sonographic evidence of residual thyroid tissue or recurrent disease with the thyroid bed.  2. Previously visualized heterogeneous nodule midline and superior to the thyroidectomy bed is not visualized on this examination.  3. No pathologically enlarged lymph nodes identified within the neck.      01/2016 Whole body scan:  FINDINGS:  There are no abnormal foci of increased radiotracer uptake identified.  There is physiologic uptake seen in the nasopharynx, the bowel, stomach, and bladder.  Impression  No evidence of I-131 avid metastasis.    05/2013 had stimulated PET and WBS, as well as 1 week post treatment WBS     Other Medical Data:

## 2022-04-04 NOTE — Unmapped (Signed)
[facility] Refill Coordination Note    Specialty Medication(s) to be Shipped:   Hematology/Oncology: Braftovi 75mg  and [name] 15mg     Other medication(s) to be shipped: No additional medications requested for fill at this time     Kenneth Fitzgerald, DOB: Jul 02, 1975  Phone: [phone] (home)       All above HIPAA information was verified with patient.     Was a Nurse, learning disability used for this call? No    Completed refill call assessment today to schedule patient's medication shipment from the [facility] [phone]).  All relevant notes have been reviewed.     Specialty medication(s) and dose(s) confirmed: Regimen is correct and unchanged.   Changes to medications: Jasen reports no changes at this time.  Changes to insurance: No  New side effects reported not previously addressed with a pharmacist or physician: None reported  Questions for the pharmacist: No    Confirmed patient received a Conservation officer, historic buildings and a Surveyor, mining with first shipment. The patient will receive a drug information handout for each medication shipped and additional FDA Medication Guides as required.       DISEASE/MEDICATION-SPECIFIC INFORMATION        N/A    SPECIALTY MEDICATION ADHERENCE     Medication Adherence    Patient reported X missed doses in the last month: 0  Specialty Medication: BRAFTOVI 75 mg capsule (encorafenib)  Patient is on additional specialty medications: Yes  Additional Specialty Medications: [name] 15 mg tablet (binimetinib)  Patient Reported Additional Medication X Missed Doses in the Last Month: 0  Patient is on more than two specialty medications: No  Informant: patient              Were doses missed due to medication being on hold? No    Braftovi 75 mg: 10 days of medicine on hand   [name] 15 mg: 10 days of medicine on hand       REFERRAL TO PHARMACIST     Referral to the pharmacist: Not needed      [facility]     Shipping address confirmed in Epic.     Patient was notified of new phone menu : No    Delivery Scheduled: Yes, Expected medication delivery date: 04/08/22.     Medication will be delivered via UPS to the prescription address in Epic WAM.    Kenneth Fitzgerald   [facility] Specialty Technician

## 2022-04-05 MED FILL — MEKTOVI 15 MG TABLET: ORAL | 30 days supply | Qty: 180 | Fill #5

## 2022-04-05 MED FILL — BRAFTOVI 75 MG CAPSULE: ORAL | 30 days supply | Qty: 180 | Fill #5

## 2022-04-09 ENCOUNTER — Telehealth: Admit: 2022-04-09 | Discharge: 2022-04-10 | Payer: PRIVATE HEALTH INSURANCE | Attending: Clinical | Primary: Clinical

## 2022-04-09 NOTE — Unmapped (Signed)
[facility]  Psychiatry  Psychotherapy Note - Telehealth via Video     Service Date: April 09, 2022  Service: 45 minutes of psychotherapy via video session  Time with Patient: 45 minutes     Encounter Description: This encounter was conducted from provider's office via EPIC video session due to COVID-19 pandemic. Keelon Buehl was located in his home. Session was conducted via EPIC.  Rationale for video session is need to social distance. See Plan for telemedicine consent/disclaimer.      Encounter Description/Consent:   [name] visit was completed through telehealth encounter (video).      This patient encounter is appropriate and reasonable under the circumstances. The patient has been advised of the potential risks and limitations of this mode of treatment (including, but not limited to, the absence of in-person examination) and has agreed to be treated in a remote fashion in spite of them. Any and all of the patient's/patient's family's questions on this issue have been answered.      The patient was physically located in [place] in which I am permitted to provide care. The patient understood that he may incur co-pays and cost sharing, and agreed to the telemedicine visit. The visit was reasonable and appropriate under the circumstances given the patient's presentation at the time.      Time Spent: 45 minutes     Assessment:  Mr. Asel is a 47yo male with a history of metastatic melanoma and thyroid cancer who I meet with for psychotherapy to address depression and adjustment to multiple stressors.  We met this morning via telehealth.  Mr. Mcnemar reported some poor mood and anxiety related to legal and interpersonal matters.  No emergent psych concerns noted.  Mr. Mcwain has re-established care with [name], NP and is taking Cymbalta.  I offered psychotherapy, support, recommendations for addressing/tolerating distress.  Will continue to follow and scheduled to meet next in ~ three weeks.     Risk Assessment:  A suicide and violence risk assessment was performed as part of this evaluation. There patient is deemed to be at chronic elevated risk for self-harm/suicide given the following factors: current diagnosis of depression, suicidal ideation or threats without a plan and past diagnosis of depression. There patient is deemed to be at chronic elevated risk for violence given the following factors: male gender. These risk factors are mitigated by the following factors: lack of active SI/HI. There is no acute risk for suicide or violence at this time. The patient was educated about relevant modifiable risk factors including following recommendations for treatment of psychiatric illness and abstaining from substance abuse. While future psychiatric events cannot be accurately predicted, the patient does not currently require acute inpatient psychiatric care and does not currently meet [facility] involuntary commitment criteria.              Plan:  1. Will continue to see Mr. Rajaram for psychotherapy, scheduled to meet next in ~ three weeks.  2. Mr. Bence has re-established care with [name], NP.  3. Mr. Joswick has information on available CCSP resources and he has worked with [name], [name].  I have previously reached out to [name] to explore financial resources.  4. Mr. Abend knows to reach me as needed.     Subjective:   Mr. Westerman presented as alert, oriented, and he was engaged in today's session.     Mr. Evitt reported experiencing some poor mood and frustration in the time since  we last met. He described legal concerns - and some related interpersonal matters - as being the impetus for his distress.  He denied emergent concerns or thoughts of self-harm.  Mr. Lagrave did report that he continues to like his part-time job at an [employer] where he feels valued and appreciated.  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     Mr. Ferrand has re-established care with [name], NP and is taking Cymbalta.  He continues to take Synthroid.     I provided Mr. Reymond support, understanding, and psychoeducation.  We discussed and processed his thoughts, feelings, and experiences - including related to newly distressing events.  Time was spent reviewing and problem-solving ways to address/tolerate depressive and anxiety symptoms.  Mr. Clement was engaged and receptive as usual.    We scheduled to meet next in ~ three weeks.     Mental Status Exam:  Appearance:     Appears stated age   Motor:    No abnormal movements   Speech/Language:     Normal rate, volume, tone, fluency   Mood:   eh, alright   Affect:    Calm; cooperative   Thought process:    Logical, linear, clear, coherent, goal-directed   Thought content:      Denies SI/HI or thought of self-harm   Perceptual disturbances:      Behavior not concerning for response to internal stimuli      Orientation:    Oriented to person, place, time, and general circumstances   Attention:    Able to fully attend without fluctuations in consciousness   Concentration:    Able to fully concentrate and attend   Memory:    Immediate, short-term, long-term, and recall grossly intact    Fund of knowledge:     Consistent with level of education and development   Insight:      Intact   Judgment:     Intact   Impulse Control:    Intact      Diagnosis: Recurrent major depressive disorder, moderate;  Anxiety associated with cancer diagnosis     [name]. Arihana Ambrocio PhD  April 23, 2022

## 2022-04-15 ENCOUNTER — Ambulatory Visit
Admit: 2022-04-15 | Discharge: 2022-04-30 | Payer: PRIVATE HEALTH INSURANCE | Attending: Radiation Oncology | Primary: Radiation Oncology

## 2022-04-15 ENCOUNTER — Ambulatory Visit
Admit: 2022-04-15 | Discharge: 2022-05-08 | Payer: PRIVATE HEALTH INSURANCE | Attending: Radiation Oncology | Primary: Radiation Oncology

## 2022-04-17 ENCOUNTER — Ambulatory Visit
Admit: 2022-04-17 | Discharge: 2022-04-18 | Payer: PRIVATE HEALTH INSURANCE | Attending: Radiation Oncology | Primary: Radiation Oncology

## 2022-04-17 ENCOUNTER — Other Ambulatory Visit: Admit: 2022-04-17 | Discharge: 2022-04-18 | Payer: PRIVATE HEALTH INSURANCE

## 2022-04-17 ENCOUNTER — Ambulatory Visit: Admit: 2022-04-17 | Discharge: 2022-04-18 | Payer: PRIVATE HEALTH INSURANCE

## 2022-04-17 ENCOUNTER — Ambulatory Visit
Admit: 2022-04-17 | Discharge: 2022-04-18 | Payer: PRIVATE HEALTH INSURANCE | Attending: Hematology & Oncology | Primary: Hematology & Oncology

## 2022-04-17 DIAGNOSIS — C439 Malignant melanoma of skin, unspecified: Principal | ICD-10-CM

## 2022-04-17 LAB — CBC W/ AUTO DIFF
BASOPHILS ABSOLUTE COUNT: 0.1 10*9/L (ref 0.0–0.1)
BASOPHILS RELATIVE PERCENT: 1 %
EOSINOPHILS ABSOLUTE COUNT: 0.3 10*9/L (ref 0.0–0.5)
EOSINOPHILS RELATIVE PERCENT: 4.6 %
HEMATOCRIT: 41 % (ref 39.0–48.0)
HEMOGLOBIN: 13.9 g/dL (ref 12.9–16.5)
LYMPHOCYTES ABSOLUTE COUNT: 1.1 10*9/L (ref 1.1–3.6)
LYMPHOCYTES RELATIVE PERCENT: 17.8 %
MEAN CORPUSCULAR HEMOGLOBIN CONC: 34 g/dL (ref 32.0–36.0)
MEAN CORPUSCULAR HEMOGLOBIN: 29.4 pg (ref 25.9–32.4)
MEAN CORPUSCULAR VOLUME: 86.3 fL (ref 77.6–95.7)
MEAN PLATELET VOLUME: 7.6 fL (ref 6.8–10.7)
MONOCYTES ABSOLUTE COUNT: 0.4 10*9/L (ref 0.3–0.8)
MONOCYTES RELATIVE PERCENT: 7.2 %
NEUTROPHILS ABSOLUTE COUNT: 4.2 10*9/L (ref 1.8–7.8)
NEUTROPHILS RELATIVE PERCENT: 69.4 %
PLATELET COUNT: 216 10*9/L (ref 150–450)
RED BLOOD CELL COUNT: 4.75 10*12/L (ref 4.26–5.60)
RED CELL DISTRIBUTION WIDTH: 15.9 % — ABNORMAL HIGH (ref 12.2–15.2)
WBC ADJUSTED: 6.1 10*9/L (ref 3.6–11.2)

## 2022-04-17 LAB — COMPREHENSIVE METABOLIC PANEL
ALBUMIN: 4 g/dL (ref 3.4–5.0)
ALKALINE PHOSPHATASE: 106 U/L (ref 46–116)
ALT (SGPT): 58 U/L — ABNORMAL HIGH (ref 10–49)
ANION GAP: 6 mmol/L (ref 5–14)
AST (SGOT): 44 U/L — ABNORMAL HIGH (ref <=34)
BILIRUBIN TOTAL: 0.3 mg/dL (ref 0.3–1.2)
BLOOD UREA NITROGEN: 18 mg/dL (ref 9–23)
BUN / CREAT RATIO: 17
CALCIUM: 8.7 mg/dL (ref 8.7–10.4)
CHLORIDE: 108 mmol/L — ABNORMAL HIGH (ref 98–107)
CO2: 24 mmol/L (ref 20.0–31.0)
CREATININE: 1.06 mg/dL
EGFR CKD-EPI (2021) MALE: 87 mL/min/{1.73_m2} (ref >=60)
GLUCOSE RANDOM: 116 mg/dL (ref 70–179)
POTASSIUM: 3.9 mmol/L (ref 3.4–4.8)
PROTEIN TOTAL: 7.1 g/dL (ref 5.7–8.2)
SODIUM: 138 mmol/L (ref 135–145)

## 2022-04-17 MED ADMIN — gadobenate dimeglumine (MULTIHANCE) 529 mg/mL (0.1mmol/0.2mL) solution 20 mL: 20 mL | INTRAVENOUS | @ 16:00:00 | Stop: 2022-04-17

## 2022-04-17 MED ADMIN — iohexol (OMNIPAQUE) 350 mg iodine/mL solution 75 mL: 75 mL | INTRAVENOUS | @ 15:00:00 | Stop: 2022-04-17

## 2022-04-17 NOTE — Unmapped (Unsigned)
PRIMARY CARE PHYSICIAN  Mangel, Benison Pap, DO  [address]  [place] [address]    CONSULTING PHYSICIANS  Lendell Caprice [place], [place]  [address]  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   -S/p thyroidectomy 06/2011 and radioactive iodine 03/2012 and again 05/2013.    -Thyroid CA recurrence 11/2012 s/p L neck dissecion 12/07/2012.  -2nd thyroid CA recurrence 03/2013 AND co-existing 1st melanoma recurrence 03/2013 s/p R -cervical level 2-4 node dissection and partial parotidectomy  -S/p radiation to R neck, completed 06/2013  -Nivolumab (02/12/18 x 2 cycles) complicated by colitis that required a 6 week steroid taper.   -s/p WBRT, completed 01/20/20    CURRENT THERAPY:   BRAF/MEK started early May 2020    ASSESSMENT: [name] is a pleasant 47 y.o. male who presents today for further evaluation of recurrent stage IV melanoma. 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    PLAN  1. Stage IV melanoma: BRAF+ by IHC. Dr Eyvonne Left reviewed path: TILS present, non-brisk; lymphocytes are 20 to 30% the number of melanoma cells.  Known sites of disease include abdominal/pelvic LN, and brain. Started dabrafenib/trametinib mid-May 2020, has had stable disease extracranially.      - Reviewed path from hernia surgery in Jan 2022, noted that the abdominal wall/peritoneal implants contained melanophages, but no melanoma cells.  We discussed that this is interesting, good to know that there wasn't melanoma in the biopsy, is in keeping w/immune response.    - He is s/p WBRT, completed 01/20/20.   - Personally reviewed CT chest, MRI brain, abdomen today. There is NED in chest/abdomen.  Brain MRI stable.  - He is back on BRAF/MEK targeted therapy.  Will continue.   - Clinically, he is doing well.  -RTC in 12 weeks for clinical follow up and repeat imaging.  -Of note, we have attempted to confirm BRAF on NGS twice, on original primary specimen from July 2012 and and pelvic LN sample from 01/09/2018.  Both samples were insufficient tissue for analysis.  At this point, BRAF is known by IHC only.     #Elevated CK;   -possibly related to encorafenib/binimetinib, also noted previously when he received adjuvant INF years ago.  -CK is trending down (currently 2.2x/ULN), encouraged hydration.   -PI notes holding drug iv 10X ULN, so will repeat if muscle cramping worsens.    #Fe deficiency anemia; likely GI source in setting of Crohn's   - feels better s/p 1 dose of IV iron 11/2021    #.) Brain mets; s/p WBRT as per above.  Brain MRI from today reviewed and stable. Rad/onc following.    #.) health maintenance/CAD on CT chest  --follows w/PCP for ongoing care    #) L ear fullness, hearing loss; ENT following.  Is better s/p PE tube placement, since fallen out.     #.) LBP, radiculopathy, MRI abdomen 01/03/21 findings w/disc bulge   - imaging shows improvement in the L sided low back findings and pt reports symptom improvement.    #.) Atypical Crohn's; recent colonoscopy 08/29/21 w/severe colitis, moderate proctitis.   -- Cont increased sulfasalazine per GI medicine.    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 During hospital stay, it was determined that  this was malignant thrombus and no AC recommended.  Will follow.     #.) Hernia repair; all scars healed and he is eating and drinking normally.  Surgeon aware abdominal wall finding, CTM.     #.) Thyroid CA, recurrent. Well controlled now. Followed by endocrinology  --TSH/FT4 repeated today now the he is back on synthroid/hasn't missed doses  --will alert endocrinology    #.) Mood; excessive stress related to h/o 2 malignancies, job loss and legal issues surrounding, parents both passed away in recent years.   He follows w/ Drs. Yopp and [place].     #.) HypoCA; he struggles w/compliance during the workday,cont Ca supplementation    #.) Supportive care/GOC;appreciate pall care support and med titration. Discussed w/[name]; OK to pause pall care for now given well controlled disease and well controlled symptoms.      INTERVAL HISTORY: This is a return visit to the [facility] for further evaluation of melanoma.  Recently started a new job at [employer]. 20-25hrs/week.   Got a new yellow lab-[employer].    Notes intermittent shoulder pains.  Hasn't missed doses of BRAF/MEK inhibitors.  No new side effects.  Received 1 dose of IV iron dextran on 11/8; no reaction  Energy improved  Continues on 2pills BID sulfasalazine, no rectal bleeding.   PE tube is out, but hearing well.    Completed PT for neck lymphedema, this is helping some.  Follows w/Dr Yopp.  No new HA, balance problems, falls.       PAST MEDICAL HISTORY:   1. Diverticulitis.   2. History of ankylosing spondylitis in 2009. Please see past medical history dictated on 11/15/2010 for details. In brief, he was treated w/Enbrel and prednisone for 6mos or so.  Symptoms improved, immunosuppressants were stopped and symptoms did not recur.   3. S/p incisional hernia repair       SOCIAL HISTORY:    reports that he has never smoked. He has never used smokeless tobacco. He reports current alcohol use. He reports that he does not use drugs.      MEDICATIONS:   Current Outpatient Medications   Medication Sig Dispense Refill    acetaminophen (TYLENOL) 500 MG tablet Take 2 tablets (1,000 mg total) by mouth every eight (8) hours as needed for pain. 30 tablet 0    binimetinib ([name]) 15 mg tablet Take 3 tablets (45 mg total) by mouth two (2) times a day. 180 tablet 11    blood sugar diagnostic Strp Dispense 100 blood glucose test strips, ok to sub any brand preferred by insurance/patient, use 3x/day; dispense whatever brand matches with meter. 100 strip 12    blood-glucose meter kit Use as instructed; dispense 1 meter, whatever is preferred by insurance 1 each 1    calcitriol (ROCALTROL) 0.25 MCG capsule Take 1 capsule (0.25 mcg total) by mouth daily. 90 capsule 3    calcium carbonate 650 mg calcium (1,625 mg) tablet Take 1 tablet (650 mg of elem calcium total) by mouth Three (3) times a day with a meal.      DULoxetine (CYMBALTA) 30 MG capsule Take 1 capsule (30 mg total) by mouth two (2) times a day. 60 capsule 11    encorafenib (BRAFTOVI) 75 mg capsule Take 6 capsules (450 mg total) by mouth daily. 180 capsule 11    fexofenadine (ALLEGRA ALLERGY) 180 MG tablet Take 1 tablet (180 mg total) by mouth daily as needed (allergies).  fluticasone propionate (FLONASE) 50 mcg/actuation nasal spray 1 spray into each nostril daily.      folic acid (FOLVITE) 1 MG tablet TAKE 1 TABLET BY MOUTH EVERY DAY 90 tablet 2    guaiFENesin (MUCINEX) 600 mg 12 hr tablet Take 1 tablet (600 mg total) by mouth every twelve (12) hours.      ibuprofen (ADVIL,MOTRIN) 200 MG tablet Take 3 tablets (600 mg total) by mouth daily as needed for pain.      lancets Misc Dispense 100 lancets, ok to sub any brand preferred by insurance/patient, use 3x/day 100 each 12    levothyroxine (SYNTHROID) 125 MCG tablet Take 375 mcg daily; please dispense 90-day supply 270 tablet 3    memantine (NAMENDA) 10 MG tablet Take 1 tablet (10 mg total) by mouth Two (2) times a day. 60 tablet 6    ofloxacin (OCUFLOX) 0.3 % ophthalmic solution Instill 4 drops in left ear twice daily for 5 days 5 mL 3    ondansetron (ZOFRAN) 8 MG tablet Take 1 tablet (8 mg total) by mouth Take as directed.      rosuvastatin (CRESTOR) 10 MG tablet Take 1 tablet (10 mg total) by mouth nightly. 90 tablet 1    sulfaSALAzine (AZULFIDINE) 500 mg tablet Take 4 tablets (2,000 mg total) by mouth two (2) times a day. 240 tablet 11     No current facility-administered medications for this visit.       REVIEW OF SYSTEMS  A complete review of systems was obtained and was negative except for those symptoms listed in the HPI     PHYSICAL EXAM  There were no vitals filed for this visit.            GEN: Awake and alert, pleasant appearing male in no acute distress  LUNGS: normal WOB, CTAB  CV: NAD, S1S2  ABD; obese, soft NTND  SKIN: No rashes, petechiae or jaundice noted.  All melanoma scarlines examined; no nodularity.    PYSCH: Alert and oriented to person, place and time  EXT: No edema noted of the lower extremity noted.          LABS  Lab Results   Component Value Date    WBC 6.6 03/01/2022    HGB 14.3 03/01/2022    HCT 42.3 03/01/2022    PLT 198 03/01/2022       Lab Results   Component Value Date    NA 137 01/16/2022    K 3.8 01/16/2022    CL 105 01/16/2022    CO2 24.0 01/16/2022    BUN 16 01/16/2022    CREATININE 1.02 01/16/2022    GLU 104 01/16/2022    CALCIUM 9.0 01/16/2022    MG 1.7 10/18/2021    PHOS 4.3 10/31/2021       Lab Results   Component Value Date    BILITOT 0.3 01/16/2022    BILIDIR 0.20 01/27/2020    PROT 7.6 01/16/2022    ALBUMIN 4.1 01/16/2022    ALT 46 01/16/2022    AST 33 01/16/2022    ALKPHOS 114 01/16/2022    GGT 42 08/22/2011       Lab Results   Component Value Date    INR 1.12 01/24/2020       RADIOLOGY RESULTS    MRI Brain W Wo Contrast    Result Date: 04/17/2022   Impression: --Numerous T1 hyperintense intracranial metastasis with at least 2 lesions demonstrating interval increase in size. The most suspicious is in  the right cingulate gyrus measuring 0.7 cm with enhancement (previously punctate). Other intracranial metastasis are unchanged with no new lesion seen.     CT Chest W Contrast    Result Date: 04/17/2022   Impression: No intrathoracic metastasis.     US Thyroid    Result Date: 04/03/2022   Impression: 1. Status post total thyroidectomy without sonographic evidence of residual thyroid tissue or recurrent disease with the thyroid bed. 2. No pathologically enlarged lymph nodes identified within the neck.        MRI abdomen 10/18/21:  Impression   Compared to prior examinations dating back to 06/21/2020, stable size of soft tissue implants along the hepatic capsule of segment 5. No new sites of metastatic disease identified within the abdomen.      Persistent mild splenomegaly.            Brain MRI 07/05/21:  Impression   Unchanged innumerable micrometastases throughout the supratentorial and infratentorial brain.       Colonoscopy 08/29/21:  Impression:            - Patent functional end-to-end colo-colonic                          anastomosis, characterized by healthy appearing mucosa.                         - Friability and scarring with contact bleeding at the                          ileocecal valve. Biopsied.                         - Pseudopolyps in the transverse colon. Biopsied.                         - Diverticulosis in the entire examined colon.                         - Erythematous, granular, edematous mucosa in the                          rectum. This is worse compared wtih prior and appears                          consistent with mildlly active Crohn's colitis.                          Biopsied to rule out melanoma.    08/29/21:  Diagnosis   A: Colon, biopsy:  -Severely active chronic colitis with erosion.     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       Echo 04/29/19:  Summary    1. The left ventricle is normal in size with normal wall thickness.    2. The left ventricular systolic function is normal, LVEF is visually  estimated at 55%.    3. The right ventricle is upper normal in size, with normal systolic  function.          PATHOLOGY:  Pelvic LN 01/09/18:  Final Diagnosis   A: Colon, transverse, biopsy  - Moderately-severely active chronic colitis  - No CMV viral cytopathic  effect, granuloma, or dysplasia identified  - No metastatic melanoma or papillary thyroid cancer identified  - See comment     B: Lymph node, pelvic, core needle biopsy  - Metastatic melanoma  - See comment        FNA R femur 07/19/14:  : Bone, right femur, core biopsy with touch imprints  - No primary or metastatic malignancy identified  - Bone with focal osteocyte dropout, suggestive of necrosis  - Bone marrow with trilineage hematopoiesis and focal effacement by chronic inflammation (see comment)    B: Bone, right femur, fine needle aspiration  - Few atypical cells present on direct smears (see comment)  - Scant fragments of edematous stromal tissue in cell block  - Bone marrow with trilineage hematopoiesis    Diagnosis:  Lymph nodes, right cervical, levels 2-4, removal and partial parotidectomy  -Metastatic melanoma involving 1 out of 20 lymph nodes, with the largest  diameter measuring 10 mm (1.0 cm) and no evidence of extracapsular extension  (1/20)  -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

## 2022-04-18 NOTE — Unmapped (Unsigned)
Nutrition Consult Note    PCP: Mangel, Benison Pap, DO  Address on File: [address], [place] [address], [place]      Verified as Current Location: Yes  Extended Emergency Contact Information  Primary Emergency Contact: Huntley, Tammy  Mobile Phone: [phone]  Relation: Sister  Preferred language: ENGLISH  Interpreter needed? No  Secondary Emergency Contact: Stage,Jay   [place]  Home Phone: [phone]  Relation: Brother     Verified Behavioral Health Emergency Contact: Primary    {    Coding tips - Do not edit this text, it will delete upon signing of note!    Telephone visits [phone] for Physicians and APPs and [phone] for Non- Physician Clinicians)- Only use minutes on the phone to determine level of service.    Video visits [phone]) - Use either level of medical decision making just as an in-person visit OR time which includes both minutes on video and pre/post minutes to determine the level of service.      :[id number]}  The patient reports they are physically located in [place] and is currently: not at home. I conducted a audio/video visit. I spent  0s on the video call with the patient. I spent an additional 10 minutes on pre- and post-visit activities on the date of service .      Referring Provider:  Self, Referred    Reason for Referral:  No chief complaint on file.      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)    Malignant neoplasm of thyroid gland (CMS-HCC)    Postoperative hypothyroidism    Hypocalcemia    Thyroid cancer (CMS-HCC)    Primary melanoma of head and neck (CMS-HCC)    Ventral hernia with bowel obstruction    Insomnia    Crohn's disease of large intestine without complication (CMS-HCC)    Gout    01/24/2020: Open, primary ventral hernia repair for SBO    Morbid obesity with BMI of 40.0-44.9, adult (CMS-HCC)    Nontoxic uninodular goiter    MDD (major depressive disorder), recurrent episode, severe (CMS-HCC)    Lumbosacral spondylosis without myelopathy    Diverticulitis of colon (without mention of hemorrhage)(562.11)    Depressive disorder    Diverticulosis of colon    Iron deficiency anemia    Suicidal ideation    Atherosclerotic cardiovascular disease    HLD (hyperlipidemia)    Anxiety associated with cancer diagnosis (CMS-HCC)    Recurrent major depressive disorder in partial remission (CMS-HCC)       Present height    Present weight    Current BMI      Weight Status Categories:  Underweight:  BMI < 18.5  Normal Weight:  BMI 18.5 - 24.9  Overweight:  BMI 25 - 29.9  Obesity Class I:  BMI 30 - 34.9  Obesity Class II:  BMI 35 - 39.9  Obesity Class III:  BMI ? 40        Weight History:  Wt Readings from Last 6 Encounters:   04/17/22 (!) 141.3 kg (311 lb 9.6 oz)   04/03/22 (!) 142.5 kg (314 lb 3.2 oz)   03/01/22 (!) 142 kg (313 lb)   11/29/21 (!) 147.7 kg (325 lb 11.2 oz)   11/21/21 (!) 148.9 kg (328 lb 4.2 oz)   10/31/21 (!) 148.1 kg (326 lb 9.6 oz)  Allergies:   Allergies   Allergen Reactions    Compazine [Prochlorperazine] Itching    Coconut Nausea And Vomiting       Relevant Medications, Herbs, Supplements:    Current Outpatient Medications:     acetaminophen (TYLENOL) 500 MG tablet, Take 2 tablets (1,000 mg total) by mouth every eight (8) hours as needed for pain., Disp: 30 tablet, Rfl: 0    binimetinib ([name]) 15 mg tablet, Take 3 tablets (45 mg total) by mouth two (2) times a day., Disp: 180 tablet, Rfl: 11    blood sugar diagnostic Strp, Dispense 100 blood glucose test strips, ok to sub any brand preferred by insurance/patient, use 3x/day; dispense whatever brand matches with meter., Disp: 100 strip, Rfl: 12    blood-glucose meter kit, Use as instructed; dispense 1 meter, whatever is preferred by insurance, Disp: 1 each, Rfl: 1    calcitriol (ROCALTROL) 0.25 MCG capsule, Take 1 capsule (0.25 mcg total) by mouth daily., Disp: 90 capsule, Rfl: 3 calcium carbonate 650 mg calcium (1,625 mg) tablet, Take 1 tablet (650 mg of elem calcium total) by mouth Three (3) times a day with a meal., Disp: , Rfl:     DULoxetine (CYMBALTA) 30 MG capsule, Take 1 capsule (30 mg total) by mouth two (2) times a day., Disp: 60 capsule, Rfl: 11    encorafenib (BRAFTOVI) 75 mg capsule, Take 6 capsules (450 mg total) by mouth daily., Disp: 180 capsule, Rfl: 11    fexofenadine (ALLEGRA ALLERGY) 180 MG tablet, Take 1 tablet (180 mg total) by mouth daily as needed (allergies)., Disp: , Rfl:     fluticasone propionate (FLONASE) 50 mcg/actuation nasal spray, 1 spray into each nostril daily., Disp: , Rfl:     folic acid (FOLVITE) 1 MG tablet, TAKE 1 TABLET BY MOUTH EVERY DAY, Disp: 90 tablet, Rfl: 2    guaiFENesin (MUCINEX) 600 mg 12 hr tablet, Take 1 tablet (600 mg total) by mouth every twelve (12) hours., Disp: , Rfl:     ibuprofen (ADVIL,MOTRIN) 200 MG tablet, Take 3 tablets (600 mg total) by mouth daily as needed for pain., Disp: , Rfl:     lancets Misc, Dispense 100 lancets, ok to sub any brand preferred by insurance/patient, use 3x/day, Disp: 100 each, Rfl: 12    levothyroxine (SYNTHROID) 125 MCG tablet, Take 375 mcg daily; please dispense 90-day supply, Disp: 270 tablet, Rfl: 3    memantine (NAMENDA) 10 MG tablet, Take 1 tablet (10 mg total) by mouth Two (2) times a day., Disp: 60 tablet, Rfl: 6    ofloxacin (OCUFLOX) 0.3 % ophthalmic solution, Instill 4 drops in left ear twice daily for 5 days, Disp: 5 mL, Rfl: 3    ondansetron (ZOFRAN) 8 MG tablet, Take 1 tablet (8 mg total) by mouth Take as directed., Disp: , Rfl:     rosuvastatin (CRESTOR) 10 MG tablet, Take 1 tablet (10 mg total) by mouth nightly., Disp: 90 tablet, Rfl: 1    sulfaSALAzine (AZULFIDINE) 500 mg tablet, Take 4 tablets (2,000 mg total) by mouth two (2) times a day., Disp: 240 tablet, Rfl: 11    Do you have any concerns about taking or affording your medications? If Yes, consider [facility] referral (ION629)  No concerns      Relevant Labs:  HGB A1C, POC (%)   Date Value   08/30/2019 5.4     Hemoglobin A1C (%)   Date Value   05/10/2020 5.4   01/03/2020 5.8 (H)   06/22/2018 6.0 (  H)   09/22/2012 5.2      No components found for: LDLCALC   BP Readings from Last 3 Encounters:   04/17/22 142/91   04/03/22 138/91   03/01/22 140/88     Lab Results   Component Value Date    CHOL 275 (H) 03/01/2022    CHOL 254 (H) 10/31/2021     Lab Results   Component Value Date    HDL 34 (L) 03/01/2022    HDL 37 (L) 10/31/2021     Lab Results   Component Value Date    LDL 176 (H) 03/01/2022    LDL 161 (H) 10/31/2021     Lab Results   Component Value Date    VLDL 65.4 (H) 03/01/2022    VLDL 56 (H) 10/31/2021     Lab Results   Component Value Date    CHOLHDLRATIO 8.1 (H) 03/01/2022    CHOLHDLRATIO 6.9 (H) 10/31/2021     Lab Results   Component Value Date    TRIG 327 (H) 03/01/2022    TRIG 280 (H) 10/31/2021    TRIG 200 (H) 01/27/2020       Lab Results   Component Value Date    VITDTOTAL 17.4 (L) 01/16/2022     Lab Results   Component Value Date    VITAMINB12 509 01/16/2022         Physical Activity:  Type: walking   Some limited activity- notes medication side effect of joint inflammation  Has been walking more- 6 days per week- active at new job, walking frequently    24-Hour recall/usual intake:    Time Intake   Breakfast Frozen biscuit  OR eggs with bacon with 16oz milk (1% choc + 1% reg mixture)  Cereal OR bagel with cream cheese OR muffin and yogurtt   Snack (AM)    Lunch PB& J, [place] sandwich OR 1 hot pocket OR soup/stew (homemade) OR salad with protein     Snack (PM)    Dinner [employer] OR takeout (divides into 2-3 meals)  Spaghetti; roast beef, green beans, corn, sliced potatoes     Snack (HS) With medicine at night- 4-5 oz milk (1% choc + 1% reg mixture)     Other Nutrition Information:  Reviewed usual intakes and activity patterns since last visit with pt along with progress on previous goals.  Weight on home scale:   Pt reported updates since last visit:        Pt reported areas for improvement:   03/19/22:  Reviewed usual intakes and activity patterns since last visit with pt along with progress on previous goals.    Pt reported updates since last visit:  -has started statin for elevated cholesterol- states he notices some general difficulty with sleep- insomnia a few times per week; not sure if it's connected  -working to increase fruit and vegetables; did some tracking for a few weeks- states he got frustrated- could never stay under calorie goal 2100cal; states avg intake 2500-2600  - states it it more feasible for him to have higher intakes at this time  -up to 32 oz 1% milk; working to increase water- yesterday ~80 oz  -discussed snacks      12/28/21:  PATIENT STATED FOCUS: pt states he had thyroid removed 9 years ago and has been unable to lose weight; in the last 6 months has gained 5-10 lbs;  In the past has worked on portion control, decreasing calories and increased exercise; notes no sustainable weight loss  -  does not check weight at home; usually at doc appts; states clothes are fitting looser since last month  -since Jan has removed regular soda- is having zero sugar soda    Reviewed usual intakes and activity patterns. Answered questions from pt.  Supportive listening provided and motivational interviewing conducted.      HISTORY   Cancer- stage 4 melanoma; previous stage 3 thyroid; many recurrances- brain chest, abdomen; taking many medications; last radiation treatment Jan 2022  Calcium deficiency- drinks lot of milk        MEAL PATTERN   3 meals    USUAL INTAKES  Dairy: 1% milk, Greek yogurt, cottage cheese, and cheese  Fruit: <1 serving per day 1-2 servings per week  Vegetables: <1 serving per day notes recent decreases  Starches: cereal (froot loops, frosted flakes, rice krispies, honey nut cheerios), bread (white and whole wheat), pasta (white), and rice (white)  Protein: chicken/poultry, beef, fish, eggs, nut butters, and beans  Snacks / Sweets: varies  Oils / Fats: olive oil and canola oil  Sodium: does not add salt to foods and frequent intake of fast food/processed foods        PORTIONS    Pt reports they have improved the last few months; when eating out takes half home      BEVERAGES   milk (1% choc + 1% reg mixture)  Water- 24-32oz  Zero sugar soda- no more than 1 bottle per day; not daily; recently reduced intakes     COOKING   Lives alone, likes cooking when has time     DINING OUT   2-3 times per week     Alcohol: 1 drink per month  Tobacco: none         Food Insecurity: No Food Insecurity (11/05/2021)    Hunger Vital Sign     Worried About Running Out of Food in the Last Year: Never true     Ran Out of Food in the Last Year: Never true          Estimated Daily Needs:  2400 calories (Mifflin-St Jeor equation for BMI > 25 using actual body weight, adjusted for weight loss)  118-147 g protein      Nutrition Diagnosis:  Overweight/obesity related to decreased energy expenditure due to sedentary lifestyle as evidenced by pt report, BMI > 30    Nutrition Intervention:  Nutrition Counseling    Materials Provided: sent via mychart      Social Determinants of Health interventions provided: N/A - no concerns identified.     Patient Stated Nutrition Goals:  Work to gradually increase water intake by adding an additional bottle of water.  Aim to have at least 1 vegetable with each dinner.  With breakfast, add one serving of fruit.     Goals Added to Visit Navigator?  Yes    Monitoring/Evaluation:  Progress towards goals:  continue    Follow-up:  5 weeks- ***     Length of visit was ***   minutes  1 unit(s) billed per insurance    Carla Drape, MPH, RD, LDN

## 2022-04-19 ENCOUNTER — Ambulatory Visit: Admit: 2022-04-19 | Payer: PRIVATE HEALTH INSURANCE

## 2022-04-19 NOTE — Unmapped (Signed)
Radiation Oncology Follow Up Visit Note    Patient Name: Kenneth Fitzgerald [facility]  Patient Age: 47 y.o.  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 man with a history of recurrent thyroid cancer treated with RAI and a stage III scalp melanoma diagnosed in 2012, pT2pN2, s/p WLE and 3/3 positive SLN's, completion right level 2 neck dissection on 11/06/10 (0/25), one year of interferon completed 01/2012, and then right neck dissection 03/29/13 showing 1+/20 nodes (1cm with no ECE).     He also had multiply recurrent thyroid cancer in the same right neck dissection and is s/p RAI on 05/21/13.  Completed post-op EBRT to right neck to 48 Gy 07/09/13.    He was noted to have multiple punctate lesions (best seen on T1 noncontrast sequence) in the brain suspicious for metastasis that have been increasing in number since January 2020.  He initially received 2 cycles of nivolumab complicated by colitis and started on dabrafenib/trametinib 05/2018 with stable extracranial disease.  For progressing punctate intracranial lesions, he completed WBRT to 30 Gy/70fx on 01/20/20.    Today's MRI demonstrates growth of at least 2 lesions, most prominently in the R cingulate gyrus measuring 7mm.  CT chest was NED.        Plan:     -Disease Status: likely progression of ~2 intracranial lesions, NED in chest     -Care Plan: proceed with SRS to progressing lesions (R cingulate gyrus, L frontal), likely 20 Gy x 1 fraction; continue systemic therapy dabrafenib/trametinib per Dr Nedra Hai     -Symptoms management: follows with [name] and [name] to help with mood and anxiety    -Continuing memantine since tolerating well and feels that memory has improved.       Interval History:  Kenneth Fitzgerald was seen today after MRI and visit with Dr. Nedra Hai.  He was sad to hear of progression in the brain.  He otherwise is tolerating dabrafenib/trametinib well and continues to stay very active with his busy work schedule. Otherwise he denied any new symptoms today including nausea, dizziness, headaches, or new weaknesses.    Review of Systems: All other systems reviewed are negative. Pertinent positives and negatives are above in interval history. Past Medical, Surgical, Family and Social Histories reviewed and updated in the electronic medical record.      Hematology/Oncology History   Primary melanoma of head and neck (CMS-HCC)   03/17/2013 Initial Diagnosis    Melanoma (CMS-HCC)     02/12/2018 - 03/12/2018 Chemotherapy    OP NIVOLUMAB 480 MG Q4W  nivolumab 480 mg every 28 days     05/22/2021 -  Cancer Staged    Staging form: Melanoma of the Skin, AJCC 7th Edition  - Clinical: Stage IV (M1c) - Signed by [name], MD on 05/22/2021       Malignant neoplasm of brain, unspecified location (CMS-HCC) (Resolved)   12/22/2019 Initial Diagnosis    Malignant neoplasm of brain, unspecified location (CMS-HCC)  12/22/2019 - 03/02/2022 Radiation    Radiation Therapy Treatment Details (12/22/2019 - 03/02/2022)  Site: Brain  Technique: IMRT  Goal: No goal specified  Planned Treatment Start Date: No planned start date specified           Physical Exam:  There were no vitals filed for this visit.    ECOG: 90,  Able to carry on normal activity; minor signs or symptoms of disease (ECOG equivalent 0)  General/Constitutional: Well-appearing, NAD   HEENT: Normocephalic, atraumatic, no scleral icterus  Skin: No suspicious lesions or rashes  Pulmonary: No respiratory distress or increased work of breathing   Abdominal: Non distended  Musculoskeletal: Full range of motion in the extremities, without edema Neurologic: Alert and oriented to conversation.  CN II-XII grossly intact. Normal gait.  Psychiatric: Appropriate affect and judgement        Radiology  MRI Brain W Wo Contrast    Result Date: 04/17/2022  EXAM: Magnetic resonance imaging, brain, without and with contrast material. DATE: 04/17/2022 11:57 AM ACCESSION: [id number] UN DICTATED: 04/17/2022 1:13 PM INTERPRETATION LOCATION: [facility]     CLINICAL INDICATION: 47 years old Male with melanoma  - C43.9 - Malignant melanoma, unspecified site (CMS - HCC)      COMPARISON: MRI brain 01/16/2022     TECHNIQUE: Multiplanar, multisequence MR imaging of the brain was performed without and with I.V. contrast.     FINDINGS:  Numerous punctate T1 hyperintense lesions with microhemorrhage throughout the supratentorial and infratentorial brain, the majority of which are unchanged. No new lesions are seen. There are 2 lesions which are mildly increased in size as follows: -Right cingulate gyrus measuring 0.7 cm (18:126), previously punctate. -Left frontal lobe measuring 0.5 cm (18:90), previously 0.4 cm.     Unchanged enhancing lesion in the left parietal calvarium (18:171).     There are scattered and confluent foci of signal abnormality within the periventricular and deep white matter which is nonspecific though commonly seen in small vessel ischemic changes. Ventricles are normal in size. There is no midline shift. No extra-axial fluid collection. No evidence of intracranial hemorrhage. No diffusion weighted signal abnormality to suggest acute infarct. Opacification of the left maxillary sinus. Right maxillary sinus mucous retention cyst.             --Numerous T1 hyperintense intracranial metastasis with at least 2 lesions demonstrating interval increase in size. The most suspicious is in the right cingulate gyrus measuring 0.7 cm with enhancement (previously punctate). Other intracranial metastasis are unchanged with no new lesion seen.         CT Chest W Contrast    Result Date: 04/17/2022  EXAM: CT CHEST W CONTRAST ACCESSION: [id number] UN     CLINICAL INDICATION: melanoma  - C43.9 - Malignant melanoma, unspecified site (CMS - HCC)     TECHNIQUE: Contiguous axial images were reconstructed through the chest following a single breath hold helical acquisition during the administration of intravenous contrast material. Images were reformatted in the axial, coronal, and sagittal planes. MIP slabs were also constructed.     COMPARISON: CT chest 01/16/2022.     FINDINGS:     LUNGS, AIRWAYS, AND PLEURA: Lungs are clear. Central airways are patent. No pleural effusion or pneumothorax.     MEDIASTINUM AND LYMPH NODES: No enlarged intrathoracic, axillary, or supraclavicular lymph nodes. Small sliding type hiatal hernia.     HEART AND VASCULATURE: Cardiac chambers are normal in size. Moderate coronary artery calcifications. No pericardial effusion. Aorta is normal in  caliber. Main pulmonary artery is normal in size.     CHEST WALL AND BONES: No suspicious lytic or sclerotic osseous lesions. Mild multilevel degenerative changes in thoracic spine. Chest wall appears normal.     UPPER ABDOMEN: Unchanged appearance of lobulated contour of the right hepatic lobe with capsular retraction, better characterized on previous MRI abdomen from 01/16/2022. Colonic diverticulosis.     OTHER: No other findings.             No intrathoracic metastasis.         US Thyroid    Result Date: 04/03/2022  EXAM: US THYROID ACCESSION: [id number] UN     CLINICAL INDICATION: 47 years old with thyroid cancer  - C73 - Thyroid cancer (CMS - HCC)      COMPARISON: Ultrasound 01/03/2021 and priors     TECHNIQUE:  Ultrasound views of the thyroid bed were obtained using gray scale and limited color Doppler imaging.     FINDINGS: Patient is status post total thyroidectomy. No residual thyroid tissue is visualized in the thyroidectomy bed.     No pathologically enlarged lymph nodes were identified throughout the neck. Lymph node measurements: Right neck levels: I: 0.5 cm, previously 0.5 cm II: cm III: cm IV: cm V: cm VI: cm     Left neck levels: I: 0.8 cm, previously 0.6 cm II: 0.6 cm, previously 0.6 cm III: 0.3 cm, previously 0.3 cm IV: cm V: 0.4 cm, previously 0.4 cm VI: cm         1. Status post total thyroidectomy without sonographic evidence of residual thyroid tissue or recurrent disease with the thyroid bed. 2. No pathologically enlarged lymph nodes identified within the neck.                Mina Marble, MD, PhD  Assistant Professor  Department of Radiation Oncology  [facility]  [id number]  [place], [place] [address]  O: [phone]  P: [phone]

## 2022-04-29 NOTE — Unmapped (Signed)
[facility] Refill Coordination Note    Specialty Medication(s) to be Shipped:   Hematology/Oncology: Braftovi 75mg  and [name] 15mg     Other medication(s) to be shipped: No additional medications requested for fill at this time     Kenneth Fitzgerald, DOB: [birth year]  Phone: [phone] (home)       All above HIPAA information was verified with patient.     Was a Nurse, learning disability used for this call? No    Completed refill call assessment today to schedule patient's medication shipment from the [facility] [phone]).  All relevant notes have been reviewed.     Specialty medication(s) and dose(s) confirmed: Regimen is correct and unchanged.   Changes to medications: [name] reports no changes at this time.  Changes to insurance: No  New side effects reported not previously addressed with a pharmacist or physician: None reported  Questions for the pharmacist: No    Confirmed patient received a Conservation officer, historic buildings and a Surveyor, mining with first shipment. The patient will receive a drug information handout for each medication shipped and additional FDA Medication Guides as required.       DISEASE/MEDICATION-SPECIFIC INFORMATION        N/A    SPECIALTY MEDICATION ADHERENCE     Medication Adherence    Patient reported X missed doses in the last month: 0  Specialty Medication: BRAFTOVI 75 mg capsule (encorafenib)  Patient is on additional specialty medications: Yes  Additional Specialty Medications: [name] 15 mg tablet (binimetinib)  Patient Reported Additional Medication X Missed Doses in the Last Month: 0  Patient is on more than two specialty medications: No  Informant: patient              Were doses missed due to medication being on hold? No    Braftovi 75 mg: 12 days of medicine on hand   [name] 15 mg: 12 days of medicine on hand       REFERRAL TO PHARMACIST     Referral to the pharmacist: Not needed      [facility]     Shipping address confirmed in Epic.     Patient was notified of new phone menu : No    Delivery Scheduled: Yes, Expected medication delivery date: 05/07/22.     Medication will be delivered via UPS to the prescription address in Epic WAM.    Kenneth Fitzgerald   [facility] Specialty Technician

## 2022-04-29 NOTE — Unmapped (Signed)
Patient arrived to clinic for consent and sim @ 1400, No questions/complaints voiced

## 2022-05-02 ENCOUNTER — Telehealth: Admit: 2022-05-02 | Discharge: 2022-05-03 | Payer: PRIVATE HEALTH INSURANCE | Attending: Clinical | Primary: Clinical

## 2022-05-02 DIAGNOSIS — C801 Malignant (primary) neoplasm, unspecified: Principal | ICD-10-CM

## 2022-05-02 DIAGNOSIS — F331 Major depressive disorder, recurrent, moderate: Principal | ICD-10-CM

## 2022-05-02 DIAGNOSIS — F411 Generalized anxiety disorder: Principal | ICD-10-CM

## 2022-05-02 NOTE — Unmapped (Signed)
[facility]  Psychiatry  Psychotherapy Note - Telehealth via Video     Service Date: May 02, 2022  Service: 52 minutes of psychotherapy via video session  Time with Patient: 52 minutes     Encounter Description: This encounter was conducted from provider's office via EPIC video session due to COVID-19 pandemic. Kenneth Fitzgerald was located in his home. Session was conducted via EPIC.  Rationale for video session is need to social distance. See Plan for telemedicine consent/disclaimer.      Encounter Description/Consent:   Kenneth Fitzgerald visit was completed through telehealth encounter (video).      This patient encounter is appropriate and reasonable under the circumstances. The patient has been advised of the potential risks and limitations of this mode of treatment (including, but not limited to, the absence of in-person examination) and has agreed to be treated in a remote fashion in spite of them. Any and all of the patient's/patient's family's questions on this issue have been answered.      The patient was physically located in [place] in which I am permitted to provide care. The patient understood that he may incur co-pays and cost sharing, and agreed to the telemedicine visit. The visit was reasonable and appropriate under the circumstances given the patient's presentation at the time.      Time Spent: 52 minutes     Assessment:  Kenneth Fitzgerald is a 47yo male with a history of metastatic melanoma and thyroid cancer who I meet with for psychotherapy to address depression and adjustment to multiple stressors.  We met today via telehealth.  Kenneth Fitzgerald shared the news of his recent MRI and the plan for cyberknife radiation next week. He described being understandably upset, while also noting some resiliency.  He denied emergent psych concerns.  I offered psychotherapy, support, recommendations for addressing/tolerating the recent news and resultant distress.  Will continue to follow and scheduled to meet next in less than two weeks.     Risk Assessment:  A suicide and violence risk assessment was performed as part of this evaluation. There patient is deemed to be at chronic elevated risk for self-harm/suicide given the following factors: current diagnosis of depression, suicidal ideation or threats without a plan and past diagnosis of depression. There patient is deemed to be at chronic elevated risk for violence given the following factors: male gender. These risk factors are mitigated by the following factors: lack of active SI/HI. There is no acute risk for suicide or violence at this time. The patient was educated about relevant modifiable risk factors including following recommendations for treatment of psychiatric illness and abstaining from substance abuse. While future psychiatric events cannot be accurately predicted, the patient does not currently require acute inpatient psychiatric care and does not currently meet [facility] involuntary commitment criteria.              Plan:  1. Will continue to see Kenneth Fitzgerald for psychotherapy, scheduled to meet next in less than two weeks.  2. Kenneth Fitzgerald has re-established care with [name], NP.  3. Kenneth Fitzgerald has information on available CCSP resources and he has worked with [name], [name].  I have previously reached out to [name] to explore financial resources.  4. Kenneth Fitzgerald knows to reach me as needed.     Subjective:   Kenneth Fitzgerald presented as alert, oriented, and was appropriately engaged in today's session.     Kenneth Fitzgerald reported on the findings of his  most recent MRI that showed the spots on [his] brain have grown, and his sense of being surprised by the news.  He described being fitted for a mask for cyberknife radiation to take place next week. Kenneth Fitzgerald noted some trepidation and anticipatory anxiety about the procedure; he does not believe that Ativan would help and expressed a level of confidence that he will be able to tolerate the procedure.  Kenneth Fitzgerald described being understandably upset and concerned about the recent news, and somewhat disappointed by the reaction of some family members.  At the same time, he voiced sensing that he is more resilient and better prepared to cope than in the past.  Kenneth Fitzgerald has plans to work and be involved with church activities in advance of next week's procedure.    Kenneth Fitzgerald has re-established care with [name], NP and is taking Cymbalta.  He also continues to take Synthroid.     I provided Kenneth Fitzgerald support, understanding, and psychoeducation.  We discussed and processed his thoughts and feelings in response to his health status and treatment plans. Kenneth Fitzgerald used the therapeutic forum well in these ways.  We reviewed and problem-solved ways to address/tolerate distress, including during next week's procedure.  Kenneth Fitzgerald was engaged and receptive as usual.     We scheduled to meet next in 2-3 weeks.     Mental Status Exam:  Appearance:     Appears stated age   Motor:    No abnormal movements   Speech/Language:     Normal rate, volume, tone, fluency   Mood:   It's been a lot lately   Affect:    Calm; cooperative; not depressed   Thought process:    Logical, linear, clear, coherent, goal-directed   Thought content:      Denies SI/HI or thought of self-harm   Perceptual disturbances:      Behavior not concerning for response to internal stimuli      Orientation:    Oriented to person, place, time, and general circumstances   Attention:    Able to fully attend without fluctuations in consciousness   Concentration:    Able to fully concentrate and attend   Memory:    Immediate, short-term, long-term, and recall grossly intact    Fund of knowledge:     Consistent with level of education and development   Insight:      Intact   Judgment:     Intact   Impulse Control:    Intact      Diagnosis: Recurrent major depressive disorder, moderate;  Anxiety associated with cancer diagnosis     [name]. Kelby Lotspeich PhD  May 02, 2022

## 2022-05-06 MED FILL — BRAFTOVI 75 MG CAPSULE: ORAL | 30 days supply | Qty: 180 | Fill #6

## 2022-05-06 MED FILL — MEKTOVI 15 MG TABLET: ORAL | 30 days supply | Qty: 180 | Fill #6

## 2022-05-07 ENCOUNTER — Ambulatory Visit: Admit: 2022-05-07 | Discharge: 2022-05-08 | Attending: Radiation Oncology | Primary: Radiation Oncology

## 2022-05-07 ENCOUNTER — Ambulatory Visit: Admit: 2022-05-07 | Discharge: 2022-05-08 | Payer: PRIVATE HEALTH INSURANCE

## 2022-05-07 DIAGNOSIS — C7931 Secondary malignant neoplasm of brain: Principal | ICD-10-CM

## 2022-05-07 MED ADMIN — acetaminophen (TYLENOL) tablet 500 mg: 500 mg | ORAL | @ 18:00:00 | Stop: 2022-05-07

## 2022-05-07 NOTE — Unmapped (Unsigned)
CYBERKNIFE RADIATION TREATMENT VISIT NOTE     Encounter Date: 05/07/2022  Patient Name: Kenneth Fitzgerald [facility]  Medical Record Number: [id number]    DIAGNOSIS:  Metastatic Melanoma    RADIATION TREATMENTS:  2000 cGy/ 1 fx to three brain mets    ASSESSMENT AND PLAN:    Plan for Therapy:  The patient will proceed to receive prescribed Cyberknife radiation treatment today.  Follow up on 07/17/22 with MRI brain (coordinated with visits with Dr. Nedra Hai and other scans)    SUBJECTIVE: Kenneth Fitzgerald [facility] is here today for his planned Cyberknife radiation treatments.  He reports that currently he has a mild headache which started while waiting in neurosurgery. Characterized the headaches as ones that he experiences fairly regularly.    OBJECTIVE:  Vital Signs for this encounter:  There were no vitals taken for this visit.  Last weight:    Wt Readings from Last 2 Encounters:   05/07/22 (!) 139.1 kg (306 lb 11.2 oz)   04/17/22 (!) 141.3 kg (311 lb 9.6 oz)     General:  Resting comfortably in recovery room.  No acute distress.    Neurologic:  Alert and oriented x 3, grossly non-focal.       Mina Marble, MD, PhD  Assistant Professor  Department of Radiation Oncology  [facility]  [address], [address]  [place], [place] [address]  O: [phone]  P: [phone]

## 2022-05-07 NOTE — Unmapped (Signed)
Neurosurgery Clinic Note - New Patient    Referring Provider: Ruben Reason    Assessment/Recommendations:    These are small lesions that have progressed on MRI. Agree that these are amenable to CK.     History of Present Illness:     Kenneth Fitzgerald is a 47 y.o. male whom we are seeing today for evaluation of CK for new brain metastasis lesions from melanoma.     Allergies    Compazine [prochlorperazine] and Coconut    Medications      Current Outpatient Medications   Medication Sig Dispense Refill    acetaminophen (TYLENOL) 500 MG tablet Take 2 tablets (1,000 mg total) by mouth every eight (8) hours as needed for pain. 30 tablet 0    binimetinib ([name]) 15 mg tablet Take 3 tablets (45 mg total) by mouth two (2) times a day. 180 tablet 11    blood sugar diagnostic Strp Dispense 100 blood glucose test strips, ok to sub any brand preferred by insurance/patient, use 3x/day; dispense whatever brand matches with meter. 100 strip 12    blood-glucose meter kit Use as instructed; dispense 1 meter, whatever is preferred by insurance 1 each 1    calcitriol (ROCALTROL) 0.25 MCG capsule Take 1 capsule (0.25 mcg total) by mouth daily. 90 capsule 3    calcium carbonate 650 mg calcium (1,625 mg) tablet Take 1 tablet (650 mg of elem calcium total) by mouth Three (3) times a day with a meal.      DULoxetine (CYMBALTA) 30 MG capsule Take 1 capsule (30 mg total) by mouth two (2) times a day. 60 capsule 11    encorafenib (BRAFTOVI) 75 mg capsule Take 6 capsules (450 mg total) by mouth daily. 180 capsule 11    fexofenadine (ALLEGRA ALLERGY) 180 MG tablet Take 1 tablet (180 mg total) by mouth daily as needed (allergies).      fluticasone propionate (FLONASE) 50 mcg/actuation nasal spray 1 spray into each nostril daily.      folic acid (FOLVITE) 1 MG tablet TAKE 1 TABLET BY MOUTH EVERY DAY 90 tablet 2    guaiFENesin (MUCINEX) 600 mg 12 hr tablet Take 1 tablet (600 mg total) by mouth every twelve (12) hours.      ibuprofen (ADVIL,MOTRIN) 200 MG tablet Take 3 tablets (600 mg total) by mouth daily as needed for pain.      lancets Misc Dispense 100 lancets, ok to sub any brand preferred by insurance/patient, use 3x/day 100 each 12    levothyroxine (SYNTHROID) 125 MCG tablet Take 375 mcg daily; please dispense 90-day supply 270 tablet 3    memantine (NAMENDA) 10 MG tablet Take 1 tablet (10 mg total) by mouth Two (2) times a day. 60 tablet 6    ofloxacin (OCUFLOX) 0.3 % ophthalmic solution Instill 4 drops in left ear twice daily for 5 days 5 mL 3    ondansetron (ZOFRAN) 8 MG tablet Take 1 tablet (8 mg total) by mouth Take as directed.      rosuvastatin (CRESTOR) 10 MG tablet Take 1 tablet (10 mg total) by mouth nightly. 90 tablet 1    sulfaSALAzine (AZULFIDINE) 500 mg tablet Take 4 tablets (2,000 mg total) by mouth two (2) times a day. 240 tablet 11     No current facility-administered medications for this visit.       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    Cancer (CMS-HCC)     melanoma, thyroid cancer    Cognitive impairment     since brain radiation he has noted some    Crohn's disease (CMS-HCC)     Depressive disorder 04/13/2015    Disease of thyroid gland     hypothyroid    Gout     Hearing impairment     hearing loss in both ears    HL (hearing loss)     HLD (hyperlipidemia) 11/08/2021    Hypothyroidism     Iron deficiency anemia 11/06/2021    Skin cancer        Past Surgical History    Past Surgical History:   Procedure Laterality Date    PR COLONOSCOPY FLX DX W/COLLJ SPEC WHEN PFRMD N/A 08/29/2021    Procedure: COLONOSCOPY, FLEXIBLE, PROXIMAL TO SPLENIC FLEXURE; DIAGNOSTIC, W/WO COLLECTION SPECIMEN BY BRUSH OR WASH;  Surgeon: Luanne Bras, MD;  Location: [facility] PROCEDURES [facility];  Service: Gastroenterology    PR COLONOSCOPY W/BIOPSY SINGLE/MULTIPLE N/A 01/09/2018    Procedure: COLONOSCOPY, FLEXIBLE, PROXIMAL TO SPLENIC FLEXURE; WITH BIOPSY, SINGLE OR MULTIPLE;  Surgeon: Vonda Antigua, MD;  [facility]: [facility];  Service: Gastroenterology    PR COLONOSCOPY W/BIOPSY SINGLE/MULTIPLE N/A 08/24/2019    Procedure: COLONOSCOPY, FLEXIBLE, PROXIMAL TO SPLENIC FLEXURE; WITH BIOPSY, SINGLE OR MULTIPLE;  Surgeon: Leland Her, MD;  Location: GI PROCEDURES [facility];  Service: Gastroenterology    PR COLONOSCOPY W/BIOPSY SINGLE/MULTIPLE N/A 08/29/2021    Procedure: COLONOSCOPY, FLEXIBLE, PROXIMAL TO SPLENIC FLEXURE; WITH BIOPSY, SINGLE OR MULTIPLE;  Surgeon: Luanne Bras, MD;  Location: [facility];  Service: Gastroenterology    PR COLSC FLX WITH DIRECTED SUBMUCOSAL NJX ANY SBST N/A 08/24/2019    Procedure: COLONOSCOPY, FLEXIBLE, PROXIMAL TO SPLENIC FLEXURE; WITH DIRECTED SUBMUCOSAL INJECTION(S), ANY SUBSTANCE;  Surgeon: Leland Her, MD;  Location: [facility] PROCEDURES [facility];  Service: Gastroenterology    PR EXPLORATORY OF ABDOMEN N/A 01/24/2020    Procedure: EXPLORATORY LAPAROTOMY, EXPLORATORY CELIOTOMY WITH OR WITHOUT BIOPSY(S);  Surgeon: [name], MD;  Location: MAIN OR [place];  Service: Trauma    PR FREEING BOWEL ADHESION,ENTEROLYSIS N/A 01/24/2020    Procedure: ENTEROLYSIS (SEPART PROC);  Surgeon: [name], MD;  Location: MAIN OR [facility];  Service: Trauma    PR IMPLANT MESH HERNIA REPAIR/DEBRIDEMENT CLOSURE N/A 01/03/2015    Procedure: IMPLANTATION OF MESH/OTHER PROSTHES INCISION/VENTRAL HERNIA REPAIR/MESH CLOSE DEBRID NECROT SOFT TIS INFECT;  Surgeon: Romero Belling, MD;  Location: MAIN OR [place];  Service: Gastrointestinal    PR LAP, VENTRAL HERNIA REPAIR,REDUCIBLE N/A 04/07/2014    Procedure: LAPAROSCOPY, SURGICAL, REPAIR, VENTRAL, UMBILICAL, SPIGELIAN OR EPIGASTRIC HERNIA, REDUCIBLE;  Surgeon: Romero Belling, MD;  Location: MAIN OR [place];  Service: Gastrointestinal    PR NEGATIVE PRESSURE WOUND THERAPY DME >50 SQ CM N/A 01/24/2020 Procedure: NEG PRESS WOUND TX (VAC ASSIST) INCL TOPICALS, PER SESSION, TSA GREATER THAN/= 50 CM SQUARED;  Surgeon: [name], MD;  Location: MAIN OR [place];  Service: Trauma    PR REMOVAL NODES, NECK,CERV CMPLT Left 12/07/2012    Procedure: CERVICAL LYMPHADENECTOMY (COMPLETE);  Surgeon: Charlott Rakes, MD;  Location: MAIN OR [facility];  Service: Surgical Oncology    PR REMOVAL NODES, NECK,CERV MOD RAD Right 03/29/2013    Procedure: CERVICAL LYMPHADENECTOMY (MODIFIED RADICAL NECK DISSECTION);  Surgeon: Charlott Rakes, MD;  Location: MAIN OR [facility];  Service: Surgical Oncology    PR REPAIR RECURR INCIS  [facility] N/A 01/03/2015    Procedure: REPAIR RECURRENT INCISIONAL OR VENTRAL HERNIA; REDUCIBLE;  Surgeon: Romero Belling, MD;  Location: MAIN OR [facility];  Service: Gastrointestinal    PR REPAIR RECURR INCIS HERNIA,STRANG N/A 06/21/2016    Procedure: REPAIR RECURRENT INCISIONAL OR VENTRAL HERNIA; INCARCERATED OR STRANGULATED;  Surgeon: Mickle Asper, MD;  Location: MAIN OR [facility];  Service: Gastrointestinal    PR REPAIR RECURR INCIS HERNIA,STRANG N/A 01/24/2020    Procedure: REPAIR RECURRENT INCISIONAL OR VENTRAL HERNIA; INCARCERATED OR STRANGULATED;  Surgeon: [name], MD;  Location: MAIN OR [facility];  Service: Trauma    PR SIGMOIDOSCOPY,FINE NEEDL BX,US GUIDED N/A 01/09/2018    Procedure: SIGMOIDOSCOPY, FLEXIBLE, W/TRANSENDOSCOPIC ULTRASOUND GUIDED NEEDLE ASPIRATION;  Surgeon: Vonda Antigua, MD;  [facility]: [facility];  Service: Gastroenterology       Family History    Obtained and noncontributory to this evaluation.    Social History:    Social History     Tobacco Use    Smoking status: Never    Smokeless tobacco: Never   Vaping Use    Vaping status: Never Used   Substance Use Topics    Alcohol use: Yes     Comment: social drinker 3 times yearly    Drug use: No     Review of Systems    A 10-system review of systems was conducted and was negative except as documented above in the HPI.    Objective     Physical Exam    Vital Signs: Reviewed in Epic.  Hemodynamically stable.  General: Appears comfortable. No obvious distress.  Ophthalmologic: Sclerae are anicteric and noninjected.  ENT: Mucous membranes moist. Oropharynx clear.  Neck: Supple.  Cardiovascular: Extremities warm and nonedematous. Regular rate and rhythm. 2+ radial pulses bilaterally.  Pulmonary: Breathing is comfortable and unlabored. Patient is acyanotic.  Integument: No obvious rashes or ecchymoses.  Psychiatry: Mood and affect appear appropriate to situation..  Abdomen: Soft, nontender, nondistended.  Genitourinary: Deferred  Rectal: Deferred.  Extremities: Warm and well-perfused. No cyanosis, clubbing, or edema.  Musculoskeletal: Grossly normal range of motion in all joints.  Neurologic:  Awake, alert  Oriented to name, location, time  Speech fluent, naming and repetition intact  Pupils equal and reactive bilaterally  Extraocular movements intact bilaterally  Face symmetric.  Tongue protrudes in the midline.  No pronator drift.  5/5 x 4  Sensation to light touch intact throughout.    Test Results    All results listed below were reviewed personally and independently.    Imaging:  MRI Brain: Multiple small enhancing lesions, most notably is the R cingulate gyrus ~30mm and 4-48mm R Frontal (gyrus)    Labs:  No recent labs.    Outside Hospital/Provider Records: As noted above.      Problem List     Diagnosis ICD-10-CM Associated Orders   1. 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

## 2022-05-07 NOTE — Unmapped (Unsigned)
Patient arrives to recovery room for Pre-CyberKnife visit and vital signs.  Patient ID confirmed, with patient stating name and date of birth.    Vital signs obtained and reviewed, within normal limits.    Verified no pre-meds taken at home (Ativan, decadron)  Administerd & charted PreMeds.tylenol 500mg  PO    Patient seen by Drs. Whitmill and Shen prior to treatment.  Queued to CK.     CK discharge /  follow-up information given and discussed.

## 2022-05-07 NOTE — Unmapped (Signed)
Doing well, agree with CK.

## 2022-05-08 NOTE — Unmapped (Signed)
CyberKnife Procedure Note for Neurosurgery  Brain Metastases Treatment    Date of Plan review:  05/03/22  Date of start of treatment: 05/07/22    I or my team met with the patient prior to planning and described the treatment planning process, the rationale for the CyberKnife treatment, and why it was preferred to other treatment options.      Attending: Valora Piccolo  Radiation Oncology Attending: Flonnie Hailstone  Resident(s): none    Primary Tumor: Melanoma  Number of lesions: 3  Sites treated: Right frontal and Left frontal  Lesions larger than 3.5 cm: None > 3.5 cm, simple lesion    Radiation to be delivered by:  Linear Accelerator, utilizing CyberKnife  Treatment Plan created using interpretation of MRI scan and CT scanning  I confirmed the identification of the normal structures on the treatment plan  I reviewed and helped created the treatment target volumes  I reviewed the dose administered and the number of fractions selected   (fractions =  1 )  The dose we selected was: 2000 cGy (cGy)   Delivered to the:  Isodose presciption line (%): 81.1     To achieve this, we used the following beam and collimator set up:  Beams: 100 from 66 nodes  Colimator (mm): 5 and 7.5    Plan created by the multidiscipilinary team   Treatment reviewed by both radiation oncology and neurosurgery   QA performed, planned carried out as prescribed   Complete record available in CyberKnife Treatment planning system in Radiation Oncology Department.     Attending Attestation:    I created or reviewed the treatment plan and approved the critical structures.  I was involved in identifying the treatment volume.  I reviewed the radiation dosing to the target and surrounding normal structures.  I approved the plan for delivery.

## 2022-05-09 MED ORDER — DEXAMETHASONE 1 MG TABLET
ORAL_TABLET | ORAL | 0 refills | 10 days | Status: CP
Start: 2022-05-09 — End: 2022-05-19

## 2022-05-09 NOTE — Unmapped (Signed)
Patient reports a frontal headache with some light sensitivity that started yesterday. He took Tylenol yesterday and has taken ibuprofen 800 mg today with slight relief. Pain was 7/10 this AM and is now 4/10. No other symptoms. Told him that info will be given to his medical team.

## 2022-05-09 NOTE — Unmapped (Signed)
Mr. Forton called with frontal headache for which he is taking Tylenol and ibuprofen.  Will send short course of dexamethasone 2mg  daily x 5 days, then 1mg  daily x 5 days.

## 2022-05-13 ENCOUNTER — Telehealth: Admit: 2022-05-13 | Discharge: 2022-05-13 | Payer: PRIVATE HEALTH INSURANCE | Attending: Clinical | Primary: Clinical

## 2022-05-13 ENCOUNTER — Telehealth: Admit: 2022-05-13 | Discharge: 2022-05-13 | Payer: PRIVATE HEALTH INSURANCE

## 2022-05-13 DIAGNOSIS — F419 Anxiety disorder, unspecified: Principal | ICD-10-CM

## 2022-05-13 DIAGNOSIS — G4700 Insomnia, unspecified: Principal | ICD-10-CM

## 2022-05-13 DIAGNOSIS — F3341 Major depressive disorder, recurrent, in partial remission: Principal | ICD-10-CM

## 2022-05-13 NOTE — Unmapped (Signed)
Comprehensive Cancer Support Program (CCSP) - Psychiatry Outpatient Clinic   After Visit Summary    It was a pleasure to see you today in the [facility] Comprehensive Cancer Support Program (CCSP). The CCSP is a multidisciplinary program dedicated to helping patients, caregivers, and families with cancer treatment, recovery and survivorship.      To schedule, cancel, or change your appointment:  Please call the [facility] schedulers at [phone], Monday through Friday 8AM - 5PM.  Someone will return your call within 24 hours.      If you have a question about your medicines or you need to contact your provider:  First, try sending a My Chart message to Delorese Sellin, PMHNP. If you are unable to do so, please call the CCSP program coordinator, Devon Suitt, at [phone].     For after hours urgent issues, you may call [phone] or call the "I need to talk" line at 1-800-273-TALK (8255) anytime 24/7.    CCSP Patient and Family Resource Center: [phone].    CCSP Website:  [email or web address]    For prescription refills, please allow at least 24 hours (during business hours, M-F) for providers to call in refills to your pharmacy. We are generally unable to accommodate same-day requests for refills.     If you are taking any controlled substances (such as anxiety or sleep medications), you must use them as the directions say to use them. We generally do not provide early refills over the phone without clear reason, and it would be inappropriate to obtain the medications from other doctors. We routinely use the [place] controlled substance database to monitor prescription drug use.

## 2022-05-13 NOTE — Unmapped (Incomplete)
[facility]  Psychiatry  Psychotherapy Note - Telehealth via Video     Service Date: May 13, 2022  Service: 46 minutes of psychotherapy via video session  Time with Patient: 46 minutes     Encounter Description: This encounter was conducted from provider's office via EPIC video session due to COVID-19 pandemic. Kenneth Fitzgerald was located in his home. Session was conducted via EPIC.  Rationale for video session is need to social distance. See Plan for telemedicine consent/disclaimer.      Encounter Description/Consent:   Kenneth Fitzgerald visit was completed through telehealth encounter (video).      This patient encounter is appropriate and reasonable under the circumstances. The patient has been advised of the potential risks and limitations of this mode of treatment (including, but not limited to, the absence of in-person examination) and has agreed to be treated in a remote fashion in spite of them. Any and all of the patient's/patient's family's questions on this issue have been answered.      The patient was physically located in [place] in which I am permitted to provide care. The patient understood that he may incur co-pays and cost sharing, and agreed to the telemedicine visit. The visit was reasonable and appropriate under the circumstances given the patient's presentation at the time.      Time Spent: 46 minutes     Assessment:  Kenneth Fitzgerald is a 47yo male with a history of metastatic melanoma and thyroid cancer who I meet with for psychotherapy to address depression and adjustment to multiple stressors.  We met this morning via telehealth.  [name] completed cyberknife radiation and is experiencing some fatigue.  On an encouraging note, he was relieved to learn that he qualified for disability.  [name] continues to be affected by heath stressors while also coping relatively well and showing resiliency. I offered psychotherapy, support, and recommendations for addressing/tolerating distress. Will continue to follow and scheduled to meet next in 2-3 weeks.     Risk Assessment:  A suicide and violence risk assessment was performed as part of this evaluation. There patient is deemed to be at chronic elevated risk for self-harm/suicide given the following factors: current diagnosis of depression, suicidal ideation or threats without a plan and past diagnosis of depression. There patient is deemed to be at chronic elevated risk for violence given the following factors: male gender. These risk factors are mitigated by the following factors: lack of active SI/HI. There is no acute risk for suicide or violence at this time. The patient was educated about relevant modifiable risk factors including following recommendations for treatment of psychiatric illness and abstaining from substance abuse. While future psychiatric events cannot be accurately predicted, the patient does not currently require acute inpatient psychiatric care and does not currently meet [facility] involuntary commitment criteria.              Plan:  1. Will continue to see [name] for psychotherapy, scheduled to meet next in 2-3 weeks.  2. [name] has re-established care with [name], NP, and met with her this morning  3. [name] has information on available CCSP resources and he has worked with [name], [name].  I have previously reached out to [name] to explore financial resources.  4. [name] knows to reach me as needed.     Subjective:   [name] presented as alert, oriented, and was appropriately engaged in today's session.    [name]  share that completed cyberknife radiation. He described tolerating the procedure well and not experiencing side effects for several days after. In recent days, he has started to experience fatigue; this is not distressing to him as he was expecting this side effect.  [name] described coping better with the findings that necessitated the cyberknife procedure. Although understandably disappointed and concerned, he demonstrates resiliency.  On an encouraging note, [name] was relieved to learn that he qualified for disability, which will help to alleviate some the financial burden he has been experiencing.    [name] has re-established care with [name], NP and is taking Cymbalta.  He also continues to take Synthroid.     I provided [name] support, understanding, praise, and psychoeducation.  We discussed and processed his thoughts and feelings in a therapeutic environment.  We reviewed and problem-solved ways to address and tolerate distress and the stressors he continues to face.  [name] was engaged and receptive as usual.     We scheduled to meet next on 5/16.     Mental Status Exam:  Appearance:     Appears stated age   Motor:    No abnormal movements   Speech/Language:     Normal rate, volume, tone, fluency   Mood:   Tired; Better (since he received news on disability)   Affect:    Calm; cooperative; not depressed   Thought process:    Logical, linear, clear, coherent, goal-directed   Thought content:      Denies SI/HI or thought of self-harm   Perceptual disturbances:      Behavior not concerning for response to internal stimuli      Orientation:    Oriented to person, place, time, and general circumstances   Attention:    Able to fully attend without fluctuations in consciousness   Concentration:    Able to fully concentrate and attend   Memory:    Immediate, short-term, long-term, and recall grossly intact    Fund of knowledge:     Consistent with level of education and development   Insight:      Intact   Judgment:     Intact   Impulse Control:    Intact      Diagnosis: Recurrent major depressive disorder, moderate;  Anxiety associated with cancer diagnosis     [name]. Kalee Broxton PhD  May 13, 2022 Consistent with level of education and development   Insight:      Intact   Judgment:     Intact   Impulse Control:    Intact      Diagnosis: Recurrent major depressive disorder, moderate;  Anxiety associated with cancer diagnosis     [name]. Sanskriti Greenlaw PhD  May 13, 2022

## 2022-05-13 NOTE — Unmapped (Signed)
[facility]  Psychiatry--Comprehensive Cancer Support Program   Established Patient E&M Service - Outpatient       Assessment:    Kenneth Fitzgerald presents for follow-up evaluation. He states that his depression and anxiety are well-managed with the duloxetine 30 mg BID. Sleep has improved since he self-discontinued his rosuvastatin, which he found to be the cause of his insomnia. He meets with primary care on 5/16 to discuss his statin medication needs further. Financial stress improved since recently learning he has been approved for Disability income. Continues to meet with [name], PhD for psychotherapy. Will reassess in 6 months.    Identifying Information:  Kenneth Fitzgerald is a 47 y.o. 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      Risk Assessment:  An assessment of suicide and violence risk factors was performed as part of this evaluation and is not significantly increased from the last visit.   While future psychiatric events cannot be accurately predicted, the patient does not currently require acute inpatient psychiatric care and does not currently meet [facility] involuntary commitment criteria.      Plan:    Problem 1: Recurrent major depressive disorder  Status of problem: chronic with mild exacerbation  Interventions:   Continue duloxetine 30 mg BID  Continue psychotherapy with [name], PhD     Problem 2: Anxiety   Status of problem:  chronic with mild exacerbation  Interventions:   See MDD plan above      Psychotherapy provided:  No billable psychotherapy service provided.    Patient has been given this writer's contact information as well as the [facility] Psychiatry urgent line number. The patient has been instructed to call 911 for emergencies.      Subjective:    Chief complaint:  Follow-up psychiatric evaluation for depression and anxiety    Interval History:   States that he stopped taking the rosuvastatin on his own and that his sleep improved. Yet to discuss this with PCP, but has an appointment with her on 5/16. Averaging 6-7 hours of sleep each night now.    Approved for Disability income. Patient reporting significant relief of his anxiety when he learned he was going to have a steady income. Still working 20-25 hours per week and enjoying his job.    States that his depressive and anxiety symptoms are mild to moderate in severity. Feels like both are well-managed. Less frequent ruminations reported. Less irritability.     Tolerated cyberknife well. Some headaches, but feeling better after starting dexamethasone.    Prior Psychiatric Medication Trials: bupropion, lorazepam, melatonin, sertraline, mirtazapine (helpful for sleep)     Psychiatric Review of Systems  Depressive Symptoms: fatigue, mild to moderate depressed mood  Manic Symptoms:  denies  Psychosis Symptoms: N/A  Anxiety Symptoms: mild to moderate anxiety, less frequent ruminations, irritability at times  Obsession/Compulsions: N/A  Panic Symptoms:  denies  Trauma Symptoms: upsetting memories of the traumas, dissociations at times, and feeling emotionally upset when reminded of the traumas  Sleep disturbance:  denies  Appetite: good  Exercise: States that he gets exercise at work because he is on his feet all day.      Objective:      Mental Status Exam:  Appearance:    Appears stated age, Well nourished, and Clean/Neat   Motor:   No abnormal movements   Speech/Language:  Normal rate, volume, tone, fluency   Mood:    Good   Affect:   Calm, Cooperative, and Euthymic   Thought process and Associations:   Logical, linear, clear, coherent, goal directed   Abnormal/psychotic thought content:     Denies SI, HI, self harm, delusions, obsessions, paranoid ideation, or ideas of reference   Perceptual disturbances:     Denies auditory and visual hallucinations, behavior not concerning for response to internal stimuli     Other:   Insight and judgment intact     I personally spent 24 minutes face-to-face and non-face-to-face in the care of this patient, which includes all pre, intra, and post visit time on the date of service.    Visit was completed by video (or phone) and the appropriate disclaimer has been included below.    The patient reports they are physically located in [place] and is currently: at home. I conducted a audio/video visit. I spent 27m 43s on the video call with the patient. I spent an additional 15 minutes on pre- and post-visit activities on the date of service .      Sheran Lawless, PMHNP  05/13/2022

## 2022-05-21 NOTE — Unmapped (Unsigned)
RADIATION ONCOLOGY TREATMENT COMPLETION NOTE    Encounter Date: 05/07/2022  Patient Name: Kenneth Fitzgerald  Medical Record Number: [id number]    Referring Physician: No referring provider defined for this encounter.    Primary Care Provider: Rosine Beat, Benison Pap, DO    DIAGNOSIS:  Kenneth Fitzgerald is a 47 y.o. 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     He originally had a Stage III scalp melanoma in 2012, pT2pN2, s/p WLE and 3/3 positive SLN's, completion right level 2 neck dissection on 11/06/10 (0/25), one year of interferon completed 01/2012, and then right neck dissection 03/29/13 showing 1+/20 nodes (1cm with no ECE).      He also has multiply recurrent thyroid cancer in the same right neck dissection and is s/p RAI on 05/21/13.  Completed post-op EBRT to right neck to 48 Gy 07/09/13.     He has had stable extracranial disease on dabrafenib/trametinib and was noted to have multiple punctate lesions (best seen on T1 noncontrast sequence) in the brain suspicious for metastasis that have been increasing in number since January 2020.  He completed WBRT to 30 Gy on 01/20/20.     TREATMENT INTENT:  durable control    CLINICAL TRIAL: no    CHEMOTHERAPY: not administered    RADIATION TREATMENT SUMMARY:          Treatment site Treatment Technique/Modality Dose per fraction Total number  of fractions Total dose Start date End date   R cingulate gyrus and left frontal lobe x 2  SRS 2000 cGy 1 2000 cGy 05/07/2022   05/07/2022     COMPLETED INTENDED COURSE:  Yes    TREATMENT BREAK > 2 WEEKS:  No    TOLERANCE TO TREATMENT:  No toxicities or complications    PLAN FOR FOLLOW-UP: [name] is to return for follow up on 07/17/22 with MRI brain (coordinated with visits with Dr. Nedra Hai and other scans)     ----------------------------------------------------------------------------------------------------------------------  May 21, 2022 9:38 AM. Documentation assistance provided by Marlene Bast, medical scribe, at the direction of Ruben Reason, MD.  ----------------------------------------------------------------------------------------------------------------------       Marlene Bast  05/21/22 9:38 AM

## 2022-05-30 ENCOUNTER — Ambulatory Visit
Admit: 2022-05-30 | Discharge: 2022-05-31 | Payer: PRIVATE HEALTH INSURANCE | Attending: Student in an Organized Health Care Education/Training Program | Primary: Student in an Organized Health Care Education/Training Program

## 2022-05-30 DIAGNOSIS — E782 Mixed hyperlipidemia: Principal | ICD-10-CM

## 2022-05-30 DIAGNOSIS — R7401 Transaminitis: Principal | ICD-10-CM

## 2022-05-30 DIAGNOSIS — M791 Myalgia, unspecified site: Principal | ICD-10-CM

## 2022-05-30 DIAGNOSIS — D509 Iron deficiency anemia, unspecified: Principal | ICD-10-CM

## 2022-05-30 LAB — COMPREHENSIVE METABOLIC PANEL
ALBUMIN: 4 g/dL (ref 3.4–5.0)
ALKALINE PHOSPHATASE: 84 U/L (ref 46–116)
ALT (SGPT): 43 U/L (ref 10–49)
ANION GAP: 8 mmol/L (ref 5–14)
AST (SGOT): 33 U/L (ref <=34)
BILIRUBIN TOTAL: 0.3 mg/dL (ref 0.3–1.2)
BLOOD UREA NITROGEN: 22 mg/dL (ref 9–23)
BUN / CREAT RATIO: 22
CALCIUM: 8.6 mg/dL — ABNORMAL LOW (ref 8.7–10.4)
CHLORIDE: 109 mmol/L — ABNORMAL HIGH (ref 98–107)
CO2: 26 mmol/L (ref 20.0–31.0)
CREATININE: 0.99 mg/dL
EGFR CKD-EPI (2021) MALE: >90 mL/min/{1.73_m2} (ref >=60)
GLUCOSE RANDOM: 97 mg/dL (ref 70–179)
POTASSIUM: 4.4 mmol/L (ref 3.5–5.1)
PROTEIN TOTAL: 7 g/dL (ref 5.7–8.2)
SODIUM: 143 mmol/L (ref 135–145)

## 2022-05-30 LAB — CBC W/ AUTO DIFF
BASOPHILS ABSOLUTE COUNT: 0.1 10*9/L (ref 0.0–0.1)
BASOPHILS RELATIVE PERCENT: 0.8 %
EOSINOPHILS ABSOLUTE COUNT: 0.2 10*9/L (ref 0.0–0.5)
EOSINOPHILS RELATIVE PERCENT: 3.4 %
HEMATOCRIT: 43.4 % (ref 39.0–48.0)
HEMOGLOBIN: 14.2 g/dL (ref 12.9–16.5)
LYMPHOCYTES ABSOLUTE COUNT: 0.9 10*9/L — ABNORMAL LOW (ref 1.1–3.6)
LYMPHOCYTES RELATIVE PERCENT: 12.6 %
MEAN CORPUSCULAR HEMOGLOBIN CONC: 32.7 g/dL (ref 32.0–36.0)
MEAN CORPUSCULAR HEMOGLOBIN: 28.9 pg (ref 25.9–32.4)
MEAN CORPUSCULAR VOLUME: 88.3 fL (ref 77.6–95.7)
MEAN PLATELET VOLUME: 8.8 fL (ref 6.8–10.7)
MONOCYTES ABSOLUTE COUNT: 0.5 10*9/L (ref 0.3–0.8)
MONOCYTES RELATIVE PERCENT: 7.2 %
NEUTROPHILS ABSOLUTE COUNT: 5.4 10*9/L (ref 1.8–7.8)
NEUTROPHILS RELATIVE PERCENT: 76 %
PLATELET COUNT: 200 10*9/L (ref 150–450)
RED BLOOD CELL COUNT: 4.91 10*12/L (ref 4.26–5.60)
RED CELL DISTRIBUTION WIDTH: 15.9 % — ABNORMAL HIGH (ref 12.2–15.2)
WBC ADJUSTED: 7.1 10*9/L (ref 3.6–11.2)

## 2022-05-30 LAB — IRON & TIBC
IRON SATURATION: 26 % (ref 20–55)
IRON: 71 ug/dL
TOTAL IRON BINDING CAPACITY: 274 ug/dL (ref 250–425)

## 2022-05-30 LAB — LIPID PANEL
CHOLESTEROL/HDL RATIO SCREEN: 6.6 — ABNORMAL HIGH (ref 1.0–4.5)
CHOLESTEROL: 258 mg/dL — ABNORMAL HIGH (ref <=200)
HDL CHOLESTEROL: 39 mg/dL — ABNORMAL LOW (ref 40–60)
LDL CHOLESTEROL CALCULATED: 164 mg/dL — ABNORMAL HIGH (ref 40–99)
NON-HDL CHOLESTEROL: 219 mg/dL — ABNORMAL HIGH (ref 70–130)
TRIGLYCERIDES: 274 mg/dL — ABNORMAL HIGH (ref 0–150)
VLDL CHOLESTEROL CAL: 54.8 mg/dL — ABNORMAL HIGH (ref 11–50)

## 2022-05-30 LAB — FERRITIN: FERRITIN: 52 ng/mL

## 2022-05-30 LAB — CK: CREATINE KINASE TOTAL: 685 U/L — ABNORMAL HIGH

## 2022-05-30 LAB — MAGNESIUM: MAGNESIUM: 1.8 mg/dL (ref 1.6–2.6)

## 2022-05-30 MED ORDER — EZETIMIBE 10 MG TABLET
ORAL_TABLET | Freq: Every day | ORAL | 3 refills | 90 days | Status: CP
Start: 2022-05-30 — End: 2023-05-30

## 2022-05-30 NOTE — Unmapped (Signed)
[facility] Refill Coordination Note    Specialty Medication(s) to be Shipped:   Hematology/Oncology: Braftovi 75mg  and [name] 15mg     Other medication(s) to be shipped: No additional medications requested for fill at this time     Kenneth Fitzgerald, DOB: [birth year]  Phone: [phone] (home)       All above HIPAA information was verified with patient.     Was a Nurse, learning disability used for this call? No    Completed refill call assessment today to schedule patient's medication shipment from the [facility] [phone]).  All relevant notes have been reviewed.     Specialty medication(s) and dose(s) confirmed: Regimen is correct and unchanged.   Changes to medications: Kenneth Fitzgerald reports no changes at this time.  Changes to insurance: No  New side effects reported not previously addressed with a pharmacist or physician: None reported  Questions for the pharmacist: No    Confirmed patient received a Conservation officer, historic buildings and a Surveyor, mining with first shipment. The patient will receive a drug information handout for each medication shipped and additional FDA Medication Guides as required.       DISEASE/MEDICATION-SPECIFIC INFORMATION        N/A    SPECIALTY MEDICATION ADHERENCE     Medication Adherence    Patient reported X missed doses in the last month: 0  Specialty Medication: BRAFTOVI 75 mg capsule (encorafenib)  Patient is on additional specialty medications: Yes  Additional Specialty Medications: [name] 15 mg tablet (binimetinib)  Patient Reported Additional Medication X Missed Doses in the Last Month: 0  Patient is on more than two specialty medications: No  Informant: patient              Were doses missed due to medication being on hold? No    Braftovi 75 mg: 12 days of medicine on hand   [name] 15 mg: 12 days of medicine on hand       REFERRAL TO PHARMACIST     Referral to the pharmacist: Not needed      [facility]     Shipping address confirmed in Epic.     Patient was notified of new phone menu : No    Delivery Scheduled: Yes, Expected medication delivery date: 06/04/22.     Medication will be delivered via UPS to the prescription address in Epic WAM.    [name]   [facility] Specialty Technician

## 2022-05-30 NOTE — Unmapped (Signed)
Assessment and Plan:   Diagnoses and all orders for this visit:    Myalgia  -     CK  -     Magnesium Level    Transaminitis  -     Comprehensive Metabolic Panel    Mixed hyperlipidemia  -     Lipid Panel  -     ezetimibe (ZETIA) 10 mg tablet; Take 1 tablet (10 mg total) by mouth daily.    Iron deficiency anemia, unspecified iron deficiency anemia type  -     CBC w/ Differential  -     Ferritin  -     Iron & TIBC    Malignant melanoma, unspecified site (CMS-HCC)    Immunization declined    ----------------------------------------------------------------------  47 yo male presents for follow up     His PMH is complicated by: Stage IV Malignant melanoma, brain metastasis, portal vein thrombosis, Thyroid cancer, hypothyroidism, hypocalcemia, Atypical Crohns  -He is following with Complex case management, Psychiatry, Hematology/Oncology, Endocrinology, ENT, GI    Malignant Melanoma, Metastasis to brain  -Since our last visit, he was found to have progression of lesions noted on Brain MRI. He has been following closely with Heme/onc, Surgical oncology and Neurosurgery.  -He has undergone session of cyber-knife radiation roughly 2 weeks ago  -He is currently taking PO steroids. As well other medications include: Binimetinib 45mg  BID, Encorafenib 450mg  qd    Thyroid cancer, hypothyroidism, hypocalcemia  -Following closely with Endocrinology  -Currently taking: calcitriol 0.25 mcg/day, and calcium 1200-1500 mg/day, Levothyroxine every day     Hypertension  BP Readings from Last 3 Encounters:   05/30/22 138/90   04/17/22 142/91   04/03/22 138/91   -His BP still remains above goal per JNC-8 and AHA/ACC which was discussed. However at this time, he has declined starting medications    Myalgias  -He continues to have myalgias. Will recheck Electrolytes and CK  -He does have a history of rhabdomyolysis but this could be secondary to medications (Binimetinib and Encorafenib)    Atherosclerotic Calcification, Hyperlipidemia  -Found on CT scan in 06/2021. Previously prescribed Rosuvastatin which he did start taking but has developed ADE and stopped  -We will recheck Lipid panel.  -We discussed starting agents such as Zeita vs Fenofibrate. However he found on the [facility] website that these may be contraindicated if someone has thyroid disease. We reviewed UpToDate contraindications and was unable to find this however.   -He has asked me to reach out to his endocrinologist regarding this. I have sent a staff message to his endocrinologist regarding this concern/if it would still be safe to start Zeita given his PMH of thyroid cancer.  -In the meantime, I advised that he take Omega-3 Fatty acid/Fish oil supplement which he can get OTC    Iron Deficiency Anemia  -Will repeat iron studies and ferritin    Atypical Crohn's  -Following with GI for Atypical Crohn's. Last Colonoscopy was 08/29/2021.  -To continue to follow with GI. Currently prescribed Sulfasalazine 2000mg  BID    #Depression  -Continue to follow closely with psychiatry  -Continue Duloxetine 30mg  BID    Preventative health   -Declined all vaccinations (he is due for COVID, Pneumococcal, Td booster and Flu)  -Will consider obtaining PSA prior to 50  -history of portal vein thrombosis---following with hematology  -history of Low Back pain, Radiculopathy---no concerns at this time    Chronic care follow up in 3-4 mnths       Subjective:  Kenneth Fitzgerald 47 y.o.male  is here for follow up     Sicne our last visit   -they found 3 more lesions on his brain and he underwent cyber knife radiation, last session 2 weeks ago---he only has to go through once  -He did not tolerate statin---got very bad dream, hallucinations.   -Last took 5 weeks ago   -He was sleeping okay before radiation,     How is work? He is liking work     He has been on dexamethasone since our last visit (ro    He did get a dog last visit, he states that he is slowly getting there. He now has 2 dogs at homes. His new dog has now opened up doors/open up baby gates.   They play fight         ROS:     Review of systems negative unless otherwise noted as per HPI  Denies: Chest pain, palpitations, shortness of breath, lightheadedness, dizziness, shortness of breath, syncope/presyncope    He notes since his thyroidecomy, he has had issues with temperature regulation    He is still having muscle cramping in his legs    Vital Signs:     Wt Readings from Last 3 Encounters:   05/30/22 (!) 141.6 kg (312 lb 3.2 oz)   05/07/22 (!) 139.1 kg (306 lb 11.2 oz)   04/17/22 (!) 141.3 kg (311 lb 9.6 oz)     Temp Readings from Last 3 Encounters:   05/30/22 36.7 ??C (98 ??F) (Temporal)   05/07/22 37.7 ??C (99.8 ??F) (Temporal)   04/17/22 37 ??C (98.6 ??F) (Oral)     BP Readings from Last 3 Encounters:   05/30/22 138/90   04/17/22 142/91   04/03/22 138/91     Pulse Readings from Last 3 Encounters:   05/30/22 62   04/17/22 76   04/03/22 70       Objective:      General Appearance: Alert, cooperative, no distress  EYES: EOMI  CV: Regular rate and rhythm.  RESP: Normal respiratory effort.  Clear to auscultation bilaterally without wheezes, rhonchi or crackles.   EXT:  No significant lower extremity edema.   MSK: gait is stable  SKIN: he has a small black spot on his left index finger that was not present last visit.   NEURO: Cranial nerves II- XII grossly intact.  No focal neurologic deficits. AOx3  PSYCHIATRIC: Pleasant

## 2022-05-31 ENCOUNTER — Telehealth: Admit: 2022-05-31 | Discharge: 2022-06-01 | Payer: PRIVATE HEALTH INSURANCE | Attending: Clinical | Primary: Clinical

## 2022-05-31 NOTE — Unmapped (Signed)
[facility]  Psychiatry  Psychotherapy Note - Telehealth via Video     Service Date: May 31, 2022  Service: 58 minutes of psychotherapy via video session  Time with Patient: 58 minutes     Encounter Description: This encounter was conducted from provider's office via EPIC video session due to COVID-19 pandemic. Odel Joswick was located in his home. Session was conducted via EPIC.  Rationale for video session is need to social distance. See Plan for telemedicine consent/disclaimer.      Encounter Description/Consent:   [name] visit was completed through telehealth encounter (video).      This patient encounter is appropriate and reasonable under the circumstances. The patient has been advised of the potential risks and limitations of this mode of treatment (including, but not limited to, the absence of in-person examination) and has agreed to be treated in a remote fashion in spite of them. Any and all of the patient's/patient's family's questions on this issue have been answered.      The patient was physically located in [place] in which I am permitted to provide care. The patient understood that he may incur co-pays and cost sharing, and agreed to the telemedicine visit. The visit was reasonable and appropriate under the circumstances given the patient's presentation at the time.      Time Spent: 58 minutes     Assessment:  Mr. Albu is a 47yo male with a history of metastatic melanoma and thyroid cancer who I meet with for psychotherapy to address depression and adjustment to multiple stressors.  We met today via telehealth.  Mr. Stofflet described feeling that he has mostly recovered from cyberknife radiation. He reported some intrafamilial issues that have arisen ahead of a family wedding this weekend, which are upsetting to Mr. Seright. Much of the session was spent processing this issues and problem-solving ways to handle it.  I offered psychotherapy, support, and recommendations for addressing/tolerating this and other distressing matters.  Will continue to follow and scheduled to meet next on 6/7.     Risk Assessment:  A suicide and violence risk assessment was performed as part of this evaluation. There patient is deemed to be at chronic elevated risk for self-harm/suicide given the following factors: current diagnosis of depression, suicidal ideation or threats without a plan and past diagnosis of depression. There patient is deemed to be at chronic elevated risk for violence given the following factors: male gender. These risk factors are mitigated by the following factors: lack of active SI/HI. There is no acute risk for suicide or violence at this time. The patient was educated about relevant modifiable risk factors including following recommendations for treatment of psychiatric illness and abstaining from substance abuse. While future psychiatric events cannot be accurately predicted, the patient does not currently require acute inpatient psychiatric care and does not currently meet [facility] involuntary commitment criteria.              Plan:  1. Will continue to see Mr. Muhlenkamp for psychotherapy, scheduled to meet next on 6/7.  2. Mr. Scovill has re-established care with [name], NP, and met with her this morning.  3. Mr. Turso has information on available CCSP resources and he has worked with [name], [name].  I have previously reached out to [name] to explore financial resources.  4. Mr. Ranjel knows to reach me as needed.     Subjective:   Mr. Kawecki presented as alert, oriented, and was appropriately engaged  as usual.    Mr. Tatge shared that he feels that he has mostly recovered from cyberknife radiation. Specifically, the fatigue he experienced has ebbed.  He has continued to work his part-time job at normal rates.  He again noted that this job brings him satisfaction.  Mr. Kitzmann was primarily interested today in discussing some intrafamilial issues that have arisen ahead of a family wedding this weekend.  These appear to be at least tangentially related to his past legal issues, and are upsetting to Mr. Farrell given how important family is to him.  Much of the session was spent processing this issues and problem-solving ways to handle it, including at the rehearsal dinner scheduled for this evening.    Mr. Evetts has re-established care with [name], NP and is taking Cymbalta.  He also continues to take Synthroid.     I provided Mr. Carabello support, understanding, praise, and psychoeducation.  We discussed and processed his thoughts and feelings in a therapeutic environment.  We reviewed and problem-solved ways to address and tolerate distress related to the aforementioned issue and other stressors he faces.  Mr. Sutcliffe was engaged and receptive.     We scheduled to meet next on 6/7.     Mental Status Exam:  Appearance:     Appears stated age   Motor:    No abnormal movements   Speech/Language:     Normal rate, volume, tone, fluency   Mood:   Ok until lately (related to family matter)   Affect:    Calm; cooperative   Thought process:    Logical, linear, clear, coherent, goal-directed   Thought content:      Denies SI/HI or thought of self-harm   Perceptual disturbances:      Behavior not concerning for response to internal stimuli      Orientation:    Oriented to person, place, time, and general circumstances   Attention:    Able to fully attend without fluctuations in consciousness   Concentration:    Able to fully concentrate and attend   Memory:    Immediate, short-term, long-term, and recall grossly intact    Fund of knowledge:     Consistent with level of education and development   Insight:      Intact   Judgment:     Intact   Impulse Control:    Intact      Diagnosis: Recurrent major depressive disorder, moderate;  Anxiety associated with cancer diagnosis     [name]. Yalanda Soderman PhD  May 31, 2022

## 2022-06-03 DIAGNOSIS — R748 Abnormal levels of other serum enzymes: Principal | ICD-10-CM

## 2022-06-03 MED FILL — BRAFTOVI 75 MG CAPSULE: ORAL | 30 days supply | Qty: 180 | Fill #7

## 2022-06-03 MED FILL — MEKTOVI 15 MG TABLET: ORAL | 30 days supply | Qty: 180 | Fill #7

## 2022-06-03 NOTE — Unmapped (Signed)
Contacted patient this morning regarding labs. He confirmed his identity before discussing     We discussed that his CK level is quite elevated compared to before which was discussed. He has had rhabdomyolysis before as well   He reports that he does not feel like he has to go to the hospital at this time and that the cramping is a little better compared to our visit.   We discussed that his renal function was WNL which was a good sign     Advised to follow up in 1-2 weeks for lab visit where we will recheck his CK as well as his renal function.   Advised to continue to stay hydrated but given his PMH, low threshold to go to the ER for IV fluids.      Discussed again that I reached out to Dr. Lyda Perone regarding [facility] and it is okay to use at this time.     He verbalized understanding and agreement. He had no further questions or concerns.

## 2022-06-13 DIAGNOSIS — F411 Generalized anxiety disorder: Principal | ICD-10-CM

## 2022-06-13 DIAGNOSIS — F3341 Major depressive disorder, recurrent, in partial remission: Principal | ICD-10-CM

## 2022-06-13 DIAGNOSIS — C801 Malignant (primary) neoplasm, unspecified: Principal | ICD-10-CM

## 2022-06-13 MED ORDER — DULOXETINE 30 MG CAPSULE,DELAYED RELEASE
ORAL_CAPSULE | Freq: Two times a day (BID) | ORAL | 3 refills | 90 days | Status: CP
Start: 2022-06-13 — End: 2023-06-13

## 2022-06-21 ENCOUNTER — Telehealth: Admit: 2022-06-21 | Discharge: 2022-06-22 | Payer: PRIVATE HEALTH INSURANCE | Attending: Clinical | Primary: Clinical

## 2022-06-21 DIAGNOSIS — F331 Major depressive disorder, recurrent, moderate: Principal | ICD-10-CM

## 2022-06-21 DIAGNOSIS — F411 Generalized anxiety disorder: Principal | ICD-10-CM

## 2022-06-21 DIAGNOSIS — C801 Malignant (primary) neoplasm, unspecified: Principal | ICD-10-CM

## 2022-06-21 NOTE — Unmapped (Signed)
[facility]  Psychiatry  Psychotherapy Note - Telehealth via Video     Service Date: June 21, 2022  Service: 49 minutes of psychotherapy via video session  Time with Patient: 49 minutes     Encounter Description: This encounter was conducted from provider's office via EPIC video session due to COVID-19 pandemic. Francisca Shively was located in his home. Session was conducted via EPIC.  Rationale for video session is need to social distance. See Plan for telemedicine consent/disclaimer.      Encounter Description/Consent:   [name] visit was completed through telehealth encounter (video).      This patient encounter is appropriate and reasonable under the circumstances. The patient has been advised of the potential risks and limitations of this mode of treatment (including, but not limited to, the absence of in-person examination) and has agreed to be treated in a remote fashion in spite of them. Any and all of the patient's/patient's family's questions on this issue have been answered.      The patient was physically located in [place] in which I am permitted to provide care. The patient understood that he may incur co-pays and cost sharing, and agreed to the telemedicine visit. The visit was reasonable and appropriate under the circumstances given the patient's presentation at the time.      Time Spent: 49 minutes     Assessment:  Mr. Prokosch is a 47yo male with a history of metastatic melanoma and thyroid cancer who I meet with for psychotherapy to address depression and adjustment to multiple stressors.  We met this morning via telehealth.  Mr. Angon reported relatively good psychosocial functioning since our last session. He noted some shoulder issues, which he believes may be related to his receipt of cyberknife radiation.  Longstanding intrafamilial issues have been manageable and he is enjoying his part-time work.  I offered psychotherapy, support, and recommendations for addressing/tolerating distress and enhancing adaptive coping.  Will continue to follow and scheduled to meet next on 7/8, following scheduled scans.     Risk Assessment:  A suicide and violence risk assessment was performed as part of this evaluation. There patient is deemed to be at chronic elevated risk for self-harm/suicide given the following factors: current diagnosis of depression, suicidal ideation or threats without a plan and past diagnosis of depression. There patient is deemed to be at chronic elevated risk for violence given the following factors: male gender. These risk factors are mitigated by the following factors: lack of active SI/HI. There is no acute risk for suicide or violence at this time. The patient was educated about relevant modifiable risk factors including following recommendations for treatment of psychiatric illness and abstaining from substance abuse. While future psychiatric events cannot be accurately predicted, the patient does not currently require acute inpatient psychiatric care and does not currently meet [facility] involuntary commitment criteria.              Plan:  1. Will continue to see Mr. Chesbro for psychotherapy, scheduled to meet next on 7/8.  2. Mr. Suppes has re-established care with [name], NP.  3. Mr. Mosely has information on available CCSP resources and he has worked with [name], [name].  I have previously reached out to [name] to explore financial resources.  4. Mr. Bores knows to reach me as needed.     Subjective:   Mr. Harkrader presented as alert, oriented, appropriately engaged, and with fair-to-bright affect.    Mr. Stolzenburg reported  largely good psychosocial functioning since our last session.  He noted relatively good mood and behavioral activity levels.  The intrafamilial stressor that he was concerned about our last session was addressed without too much drama.  Mr. Tullar has been talking with - and supporting - two friends who are undergoing cancer care; he feels good about his ability to support them.  He reported having some shoulder issues for which he is receiving treatment; he believes these may be related to his receipt of cyberknife radiation.  Mr. Entin continues to find his part-time work rewarding.    Mr. Yatsko has re-established care with [name], NP and is taking Cymbalta.  He also continues to take Synthroid.     I provided Mr. Piloto support, understanding, praise, and psychoeducation.  We discussed and processed his thoughts and feelings in some depth today in a therapeutic environment.  We reviewed and problem-solved ways to address and tolerate distress and to promote continued adaptation.  Mr. Kuefler was engaged and receptive.     We scheduled to meet next on 7/8, which will follow scans scheduled for the previous week     Mental Status Exam:  Appearance:     Appears stated age   Motor:    No abnormal movements   Speech/Language:     Normal rate, volume, tone, fluency   Mood:   pretty good   Affect:    Calm; cooperative; fair-to-bright   Thought process:    Logical, linear, clear, coherent, goal-directed   Thought content:      Denies SI/HI or thought of self-harm   Perceptual disturbances:      Behavior not concerning for response to internal stimuli      Orientation:    Oriented to person, place, time, and general circumstances   Attention:    Able to fully attend without fluctuations in consciousness   Concentration:    Able to fully concentrate and attend   Memory:    Immediate, short-term, long-term, and recall grossly intact    Fund of knowledge:     Consistent with level of education and development   Insight:      Intact   Judgment:     Intact   Impulse Control:    Intact      Diagnosis: Recurrent major depressive disorder, moderate;  Anxiety associated with cancer diagnosis     [name]. Martha Ellerby PhD  June 21, 2022

## 2022-06-25 NOTE — Unmapped (Signed)
[facility] Refill Coordination Note    Specialty Medication(s) to be Shipped:   Hematology/Oncology: Braftovi 75mg  and [name] 15mg     Other medication(s) to be shipped: No additional medications requested for fill at this time     [facility], DOB: October 27, 1975  Phone: [phone] (home)       All above HIPAA information was verified with patient.     Was a Nurse, learning disability used for this call? No    Completed refill call assessment today to schedule patient's medication shipment from the [facility] [phone]).  All relevant notes have been reviewed.     Specialty medication(s) and dose(s) confirmed: Regimen is correct and unchanged.   Changes to medications: [facility] reports no changes at this time.  Changes to insurance: No  New side effects reported not previously addressed with a pharmacist or physician: None reported  Questions for the pharmacist: No    Confirmed patient received a Conservation officer, historic buildings and a Surveyor, mining with first shipment. The patient will receive a drug information handout for each medication shipped and additional FDA Medication Guides as required.       DISEASE/MEDICATION-SPECIFIC INFORMATION        N/A    SPECIALTY MEDICATION ADHERENCE     Medication Adherence    Patient reported X missed doses in the last month: 0  Specialty Medication: Braftovi 75mg   Patient is on additional specialty medications: Yes  Additional Specialty Medications: [name] 15mg   Patient Reported Additional Medication X Missed Doses in the Last Month: 0  Patient is on more than two specialty medications: No  Informant: patient              Were doses missed due to medication being on hold? No    Braftovi 75 mg: 12 days of medicine on hand   [name] 15 mg: 12 days of medicine on hand       REFERRAL TO PHARMACIST     Referral to the pharmacist: Not needed      [facility]     Shipping address confirmed in Epic.       Delivery Scheduled: Yes, Expected medication delivery date: 07/04/22.     Medication will be delivered via UPS to the prescription address in Epic WAM.    [name]   [facility] Specialty Technician

## 2022-06-27 NOTE — Unmapped (Signed)
Complex Case Management  SUMMARY NOTE    High Risk Care Coordinator  spoke with patient and verified correct patient using two identifiers today to introduce the Complex Case Management program.     Discussed the following:  Program Services, Expectations of participation, and Verified Demographics    Program status: Declined  Patient states that he does not need the services of the Complex Case Management program at this time. Care Coordinator has voiced understanding and will send our contact information to patient via his [place] My Chart shall he need to utilize our services in the future as patient has voiced understanding.       Neesha Langton - High Risk Care Coordinator   [facility]  [address], [address]  [place], [place] [address]  P: [phone] F: [phone]  [email or web address]

## 2022-07-03 MED FILL — BRAFTOVI 75 MG CAPSULE: ORAL | 30 days supply | Qty: 180 | Fill #8

## 2022-07-03 MED FILL — MEKTOVI 15 MG TABLET: ORAL | 30 days supply | Qty: 180 | Fill #8

## 2022-07-10 NOTE — Unmapped (Signed)
Radiation Oncology Follow Up Visit Note    Patient Name: Kenneth Fitzgerald [facility]  Patient Age: 47 y.o.  Encounter Date: 07/17/2022    Referring Physician:   Gae Bon, MD  [address]  [id number]  [place],  [place] [address]    Primary Care Provider:  Mangel, Benison Pap, DO    Diagnoses:  1. Adhesive capsulitis of right shoulder    2. 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    He initially had a Stage III scalp melanoma in 2012, pT2pN2, s/p WLE and 3/3 positive SLN's, completion right level 2 neck dissection on 11/06/10 (0/25), one year of interferon completed 01/2012, and then right neck dissection 03/29/13 showing 1+/20 nodes (1cm with no ECE).     He also has multiply recurrent thyroid cancer in the same right neck dissection and is s/p RAI on 05/21/13.  Completed post-op EBRT to right neck to 48 Gy 07/09/13.    He has had stable extracranial disease on dabrafenib/trametinib and was noted to have multiple punctate lesions (best seen on T1 noncontrast sequence) in the brain suspicious for metastasis that have been increasing in number since January 2020.  He completed WBRT to 30 Gy on 01/20/20.    MRI 04/27/22 demonstrated growth of 3 lesions, he is now s/p radiation to 3 progressive mets as above completed 05/07/22.      Plan:     -Disease Status: intracranial control, NED chest on scans today; Stable in Ab/Pelv     -Care Plan: systemic therapy braftovi/mektovi per Dr Nedra Hai     -Continuing memantine since tolerating well and feels that memory has improved.     -Follow-up: 3 months with MRI brain (coordinated with Dr. Nedra Hai)    -Single dose of Ibuprofen 800mg  given in clinic today for right sided adhesive capsulitis, worsened by dog pulling this morning.      Interval History:  [name] returns today for a regularly scheduled follow-up.    Today he reports feeling fatigued and that he falls asleep if he remains inactive for too long.     He reports experiencing adhesive capsulitis on the right shoulder more than the left. He reports having trouble sleeping due to his shoulder problems. This was exacerbated by his dogs (weighing a combined total of 125lbs) pulling on the leash this morning when spotting a turtle.    Otherwise he is doing well with no new symptoms or concerns.    Review of Systems: All other systems reviewed are negative. Pertinent positives and negatives are above in interval history. Past Medical, Surgical, Family and Social Histories reviewed and updated in the electronic medical record. He is seeing Dr. Nedra Hai later today.      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)   12/22/2019 Initial Diagnosis    Malignant neoplasm of brain, unspecified location (CMS-HCC)     12/22/2019 - 03/02/2022 Radiation    Radiation Therapy Treatment Details (12/22/2019 - 03/02/2022)  Site: Brain  Technique: IMRT  Goal: No goal specified  Planned Treatment Start Date: No planned start date specified     04/29/2022 -  Radiation    Radiation Therapy Treatment Details (Noted on 04/29/2022)  Site: Not Applicable Brain  Technique: SRS  Goal: No goal specified  Planned Treatment Start Date: No planned start date specified           Physical Exam:  Vitals:    07/17/22 1502 Weight: (!) 142 kg (313 lb 1.6 oz)   ECOG: 90,  Able to carry on normal activity; minor signs or symptoms of disease (ECOG equivalent 0)  General/Constitutional: Well-appearing, NAD   HEENT: Normocephalic, atraumatic, no scleral icterus, missing teeth in back of mouth   Skin: No suspicious lesions or rashes  Pulmonary: No respiratory distress or increased work of breathing   Musculoskeletal: Full range of motion in the extremities, without edema   Neurologic: Alert and oriented to conversation.  CN II-XII tested and intact. 5/5 strength throughout the upper and lower extremities. Gait normal, tandem gait performed. No dysdiadochokinesia.  Psychiatric: Appropriate affect and judgement        Radiology  MRI Brain W Wo Contrast    Result Date: 07/17/2022  EXAM: Magnetic resonance imaging, brain, without and with contrast material. ACCESSION: [id number] UN     CLINICAL INDICATION: 47 years old Male with follow up brain metastasis  - C79.31 - Metastasis to brain (CMS - HCC)      COMPARISON: MRI brain 04/17/2022     TECHNIQUE: Multiplanar, multisequence MR imaging of the brain was performed without and with I.V. contrast.     FINDINGS:  Numerous scattered intrinsically T1 hyperintense foci throughout the bilateral cerebral and cerebellar hemispheres with associated susceptibility blooming on SWI, compatible with microhemorrhages. These foci are minimally changed compared to the prior study.     Unchanged left parietal calvarium lesion (19:180).     There are scattered and confluent foci of signal abnormality within the periventricular and deep white matter.  These are nonspecific but commonly seen with small vessel ischemic changes. Ventricles are normal in size. There is no midline shift. No extra-axial fluid collection. No evidence of intracranial hemorrhage. No diffusion weighted signal abnormality to suggest acute infarct.     Opacification of the left maxillary sinus. Right maxillary sinus mucous retention cyst. Numerous bilateral T1 intrinsically hyperintense metastases are again noted throughout the bilateral cerebral and cerebellar hemispheres, overall stable compared to prior.     No new lesion identified.             CT Chest W Contrast    Result Date: 07/17/2022  EXAM: CT CHEST W CONTRAST ACCESSION: [id number] UN     CLINICAL INDICATION:  C43.9 - Malignant melanoma, unspecified site (CMS - HCC).     TECHNIQUE: Contiguous axial images were reconstructed through the chest following a single breath hold helical acquisition during the administration of intravenous contrast material. Images were reformatted in the axial, coronal, and sagittal planes. MIP slabs were also constructed.     COMPARISON: CT  chest 04/17/2022     FINDINGS:     LUNGS, AIRWAYS, AND PLEURA: Lungs are clear. Central airways are patent. No pleural effusion or pneumothorax.     MEDIASTINUM AND LYMPH NODES: No enlarged intrathoracic, axillary, or supraclavicular lymph nodes. Small sliding type hiatal hernia.     HEART AND VASCULATURE: Cardiac chambers are normal in size. Moderate coronary artery calcifications. No pericardial effusion. Aorta is normal in caliber. Main pulmonary artery is normal in size.     CHEST WALL AND BONES: No suspicious lytic or sclerotic osseous lesion. Mild degenerative changes of thoracic spine.  UPPER ABDOMEN: Reported separately on same-day MRI abdomen.                 No thoracic metastasis.3         MRI Abdomen W Contrast    Result Date: 07/17/2022  EXAM: MRI abdomen with and without contrast ACCESSION: [id number] UN CLINICAL INDICATION: 47 years old with melanoma  - C43.9 - Malignant melanoma, unspecified site (CMS - HCC)      COMPARISON: 04/17/2022     TECHNIQUE: MRI of the abdomen was obtained with and without IV contrast. Multisequence, multiplanar images were obtained.        CONTRAST: 20 mL of Multihance (Gadobenate Dimeglumine)     FINDINGS:     LOWER CHEST: Please see separately dictated chest CT for further evaluation of the hemidiaphragms.     ABDOMEN:     HEPATOBILIARY: Liver again demonstrates a slightly undulating contour with capsular retraction along the right hepatic lobe. T1 hyperintense foci within segment VIII/V (15:31) are unchanged in size from prior examination measuring up to 1.2 cm. No definite enhancement is identified. No new hepatic lesions identified. The gallbladder is decompressed/contracted with internal sludge/small stones again noted. No biliary ductal dilatation. PANCREAS: No focal pancreatic lesion or ductal dilatation. SPLEEN: Possible splenomegaly. The largest diameter is 15 cm. More refined spleen size assessment with volume calculation can be obtained for CT and MR by request from [facility] radiology 3D lab ([email or web address]). ADRENAL GLANDS: No discrete adrenal nodule. KIDNEYS/URETERS: Symmetric nephrograms. No solid mass. Tiny subcentimeter left upper pole renal cyst. BOWEL/PERITONEUM/RETROPERITONEUM: No bowel obstruction. No acute inflammatory process No ascites. Colonic diverticulosis. VASCULATURE: Abdominal aorta within normal limits. Portal and hepatic veins are patent. Unremarkable inferior vena cava. LYMPH NODES: No adenopathy.     BONES/SOFT TISSUES: No worrisome osseous lesion.         1.  Stable size and appearance of previously-described T1 hyperintense foci within hepatic segments V/VIII. No new focal hepatic lesion identified. 2.  Contracted/decompressed gallbladder with internal sludge/small stones, unchanged. 3.  Additional chronic and incidental findings as above.        ----------------------------------------------------------------------------------------------------------------------  July 17, 2022 3:29 PM. Documentation assistance provided by Johney Maine, medical scribe, at the direction of Ruben Reason, MD.  ----------------------------------------------------------------------------------------------------------------------     Sydnee Levans, PA-C  Department of Radiation Oncology  [facility]  [address], [address]  [place], [place] [address]  O: [phone]  07/17/22 3:56 PM            ATTENDING ATTESTATION:    I discussed the findings, assessment, and plan of care with the physician assistant.  I agree with the findings and plan as documented in the physician assistant's note.      I personally reviewed all relevant data associated with this encounter including imaging, labwork, and prior records.      Mina Marble, MD, PhD  Assistant Professor  Department  of Radiation Oncology  [facility]  [address], [address]  [place], [place] [address]  O: [phone]  P: [phone]

## 2022-07-17 ENCOUNTER — Ambulatory Visit
Admit: 2022-07-17 | Discharge: 2022-07-17 | Payer: PRIVATE HEALTH INSURANCE | Attending: Hematology & Oncology | Primary: Hematology & Oncology

## 2022-07-17 ENCOUNTER — Ambulatory Visit
Admit: 2022-07-17 | Discharge: 2022-07-18 | Payer: PRIVATE HEALTH INSURANCE | Attending: Radiation Oncology | Primary: Radiation Oncology

## 2022-07-17 ENCOUNTER — Other Ambulatory Visit: Admit: 2022-07-17 | Discharge: 2022-07-17 | Payer: PRIVATE HEALTH INSURANCE

## 2022-07-17 ENCOUNTER — Ambulatory Visit: Admit: 2022-07-17 | Discharge: 2022-07-17 | Payer: PRIVATE HEALTH INSURANCE

## 2022-07-17 LAB — COMPREHENSIVE METABOLIC PANEL
ALBUMIN: 4.1 g/dL (ref 3.4–5.0)
ALKALINE PHOSPHATASE: 86 U/L (ref 46–116)
ALT (SGPT): 38 U/L (ref 10–49)
ANION GAP: 8 mmol/L (ref 5–14)
AST (SGOT): 39 U/L — ABNORMAL HIGH (ref <=34)
BILIRUBIN TOTAL: 0.4 mg/dL (ref 0.3–1.2)
BLOOD UREA NITROGEN: 17 mg/dL (ref 9–23)
BUN / CREAT RATIO: 16
CALCIUM: 8.3 mg/dL — ABNORMAL LOW (ref 8.7–10.4)
CHLORIDE: 109 mmol/L — ABNORMAL HIGH (ref 98–107)
CO2: 23 mmol/L (ref 20.0–31.0)
CREATININE: 1.07 mg/dL
EGFR CKD-EPI (2021) MALE: 86 mL/min/{1.73_m2} (ref >=60)
GLUCOSE RANDOM: 91 mg/dL (ref 70–179)
POTASSIUM: 3.9 mmol/L (ref 3.4–4.8)
PROTEIN TOTAL: 7.3 g/dL (ref 5.7–8.2)
SODIUM: 140 mmol/L (ref 135–145)

## 2022-07-17 LAB — CBC W/ AUTO DIFF
BASOPHILS ABSOLUTE COUNT: 0 10*9/L (ref 0.0–0.1)
BASOPHILS RELATIVE PERCENT: 0.2 %
EOSINOPHILS ABSOLUTE COUNT: 0.2 10*9/L (ref 0.0–0.5)
EOSINOPHILS RELATIVE PERCENT: 3.2 %
HEMATOCRIT: 42.8 % (ref 39.0–48.0)
HEMOGLOBIN: 14.3 g/dL (ref 12.9–16.5)
LYMPHOCYTES ABSOLUTE COUNT: 1.4 10*9/L (ref 1.1–3.6)
LYMPHOCYTES RELATIVE PERCENT: 21.2 %
MEAN CORPUSCULAR HEMOGLOBIN CONC: 33.5 g/dL (ref 32.0–36.0)
MEAN CORPUSCULAR HEMOGLOBIN: 29.4 pg (ref 25.9–32.4)
MEAN CORPUSCULAR VOLUME: 87.8 fL (ref 77.6–95.7)
MEAN PLATELET VOLUME: 8.2 fL (ref 6.8–10.7)
MONOCYTES ABSOLUTE COUNT: 0.4 10*9/L (ref 0.3–0.8)
MONOCYTES RELATIVE PERCENT: 6.8 %
NEUTROPHILS ABSOLUTE COUNT: 4.5 10*9/L (ref 1.8–7.8)
NEUTROPHILS RELATIVE PERCENT: 68.6 %
PLATELET COUNT: 223 10*9/L (ref 150–450)
RED BLOOD CELL COUNT: 4.88 10*12/L (ref 4.26–5.60)
RED CELL DISTRIBUTION WIDTH: 15.8 % — ABNORMAL HIGH (ref 12.2–15.2)
WBC ADJUSTED: 6.6 10*9/L (ref 3.6–11.2)

## 2022-07-17 LAB — TSH: THYROID STIMULATING HORMONE: 5.233 u[IU]/mL — ABNORMAL HIGH (ref 0.550–4.780)

## 2022-07-17 LAB — T4, FREE: FREE T4: 0.94 ng/dL (ref 0.89–1.76)

## 2022-07-17 LAB — SLIDE REVIEW

## 2022-07-17 MED ADMIN — iohexol (OMNIPAQUE) 350 mg iodine/mL solution 75 mL: 75 mL | INTRAVENOUS | @ 18:00:00 | Stop: 2022-07-17

## 2022-07-17 MED ADMIN — gadobenate dimeglumine (MULTIHANCE) 529 mg/mL (0.1mmol/0.2mL) solution 20 mL: 20 mL | INTRAVENOUS | @ 18:00:00 | Stop: 2022-07-17

## 2022-07-17 MED ADMIN — ibuprofen (MOTRIN) tablet 800 mg: 800 mg | ORAL | @ 19:00:00 | Stop: 2022-07-17

## 2022-07-17 NOTE — Unmapped (Signed)
Patient received 800mg  Ibuprofen clinic, as ordered.

## 2022-07-17 NOTE — Unmapped (Signed)
07/17/2022      Subjective/Assessment/Recommendations: Pt in radiation oncology clinic today for follow up regarding radiation treatment to the brain. Pt finished treatment on 05/07/22.      1. Fatigue: Mild  2. Pain: Denies pain  3. Neurologic status: Headache immediately following tx but resolved with steroids, no current issues   4. Psychosocial: Has home support    Wt Readings from Last 6 Encounters:   07/17/22 (!) 142 kg (313 lb 1.6 oz)   05/30/22 (!) 141.6 kg (312 lb 3.2 oz)   05/07/22 (!) 139.1 kg (306 lb 11.2 oz)   04/17/22 (!) 141.3 kg (311 lb 9.6 oz)   04/03/22 (!) 142.5 kg (314 lb 3.2 oz)   03/01/22 (!) 142 kg (313 lb)

## 2022-07-17 NOTE — Unmapped (Signed)
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   -S/p thyroidectomy 06/2011 and radioactive iodine 03/2012 and again 05/2013.    -Thyroid CA recurrence 11/2012 s/p L neck dissecion 12/07/2012.  -2nd thyroid CA recurrence 03/2013 AND co-existing 1st melanoma recurrence 03/2013 s/p R -cervical level 2-4 node dissection and partial parotidectomy  -S/p radiation to R neck, completed 06/2013  -Nivolumab (02/12/18 x 2 cycles) complicated by colitis that required a 6 week steroid taper.   -s/p WBRT, completed 01/20/20  -s/p 3 progressive brain mets 04/2022    CURRENT THERAPY:   BRAF/MEK started early May 2020    ASSESSMENT: Kenneth Fitzgerald is a pleasant 47 y.o. male who presents today for further evaluation of recurrent stage IV melanoma. 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    PLAN  1. Stage IV melanoma: BRAF+ by IHC. Dr Kenneth Fitzgerald reviewed path: TILS present, non-brisk; lymphocytes are 20 to 30% the number of melanoma cells.  Known sites of disease include abdominal/pelvic LN, and brain. Started dabrafenib/trametinib mid-May 2020, has had stable disease extracranially.      - Reviewed path from hernia surgery in Jan 2022, noted that the abdominal wall/peritoneal implants contained melanophages, but no melanoma cells.  We discussed that this is interesting, good to know that there wasn't melanoma in the biopsy, is in keeping w/immune response.    - He is s/p WBRT, completed 01/20/20.   - He completed SRS to 3 progressive brain mets 04/2022 w/good tolerability  - Personally reviewed CT chest, MRI brain, abdomen today. There is no evidence of progression.    - Cont BRAF/MEK targeted therapy.    - RTC in 3mos for clinical follow up and repeat imaging.  -Of note, we have attempted to confirm BRAF on NGS twice, on original primary specimen from July 2012 and and pelvic LN sample from 01/09/2018.  Both samples were insufficient tissue for analysis.  At this point, BRAF is known by IHC only.   - cont local dermatology follow up    #Elevated CK- no cramps, not checked today;   -possibly related to encorafenib/binimetinib, also noted previously when he received adjuvant INF years ago.  -PI notes holding drug iv 10X ULN, so will repeat if muscle cramping worsens.    #Fe deficiency anemia; likely GI source in setting of Crohn's   - feels better s/p 1 dose of IV iron 11/2021  - Hgb normal today and no sig blood loss reported    #.) health maintenance/CAD on CT chest  --follows w/PCP for ongoing care    #) L ear fullness, hearing loss; ENT following.  Is better s/p PE tube placement, since fallen out.     #.) Atypical Crohn's; recent colonoscopy 08/29/21 w/severe colitis, moderate proctitis.   -- Cont sulfasalazine per GI medicine.    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 During hospital stay, it was determined that this was malignant thrombus and no AC recommended.  Will follow.     #.) Thyroid CA, recurrent.   - Well controlled now. Followed by endocrinology  - TSH elevated, but FT4 nomal    #.) Mood;  excessive stress related to h/o 2 malignancies, job loss and legal issues surrounding, parents both passed away in recent years.   He follows w/ Kenneth Fitzgerald and Kenneth Fitzgerald.     #.) HypoCA; he struggles w/compliance during the workday,cont Ca supplementation    #.) Supportive care/GOC;appreciate pall care support and med titration. Discussed w/Kenneth Fitzgerald; OK to pause pall care for now given well controlled disease and well controlled symptoms.      INTERVAL HISTORY: This is a return visit to the [facility] for further evaluation of melanoma.  s/p radiation to 3 progressive brain mets as above completed 05/07/22.  Initially had HA, but this resolved.  No other new CNS symptoms  Is more fatigued lately  Not sleeping that well; waking up after 2hrs  Continues working at [employer]. 20-25hrs/week.   This is a much better fit for his schedule.  Is going through PT for shoulder pains.  This may be contributing to poor sleep.  Hasn't missed doses of BRAF/MEK inhibitors.  No new side effects.  Continues on 2pills BID sulfasalazine, rare rectal bleeding.   No change in hearing.    No sig weight loss      PAST MEDICAL HISTORY:   1. Diverticulitis.   2. History of ankylosing spondylitis in 2009. Please see past medical history dictated on 11/15/2010 for details. In brief, he was treated w/Enbrel and prednisone for 6mos or so.  Symptoms improved, immunosuppressants were stopped and symptoms did not recur.   3. S/p incisional hernia repair       SOCIAL HISTORY:    reports that he has never smoked. He has never used smokeless tobacco. He reports current alcohol use. He reports that he does not use drugs.      MEDICATIONS:   Current Outpatient Medications   Medication Sig Dispense Refill    binimetinib ([name]) 15 mg tablet Take 3 tablets (45 mg total) by mouth two (2) times a day. 180 tablet 11    calcitriol (ROCALTROL) 0.25 MCG capsule Take 1 capsule (0.25 mcg total) by mouth daily. 90 capsule 3    DULoxetine (CYMBALTA) 30 MG capsule Take 1 capsule (30 mg total) by mouth two (2) times a day. (Patient taking differently: Take 1 capsule (30 mg total) by mouth daily.) 180 capsule 3    encorafenib (BRAFTOVI) 75 mg capsule Take 6 capsules (450 mg total) by mouth daily. 180 capsule 11    fluticasone propionate (FLONASE) 50 mcg/actuation nasal spray 1 spray into each nostril daily.      folic acid (FOLVITE) 1 MG tablet TAKE 1 TABLET BY MOUTH EVERY DAY 90 tablet 2    levothyroxine (SYNTHROID) 125 MCG tablet Take 375 mcg daily; please dispense 90-day supply 270 tablet 3    sulfaSALAzine (AZULFIDINE) 500 mg tablet Take 4 tablets (2,000 mg total) by mouth two (2) times a day. 240 tablet 11    acetaminophen (TYLENOL) 500 MG tablet Take 2 tablets (1,000 mg total) by mouth every eight (8) hours as needed for pain. 30 tablet 0    blood sugar diagnostic Strp Dispense 100 blood glucose test strips, ok to sub any brand preferred by insurance/patient, use 3x/day; dispense whatever brand matches with meter. 100 strip 12    blood-glucose meter kit Use as instructed; dispense 1 meter, whatever is preferred by insurance 1 each 1    calcium carbonate 650 mg calcium (1,625 mg) tablet Take 1 tablet (650 mg elem calcium total) by mouth  Three (3) times a day with a meal.      ezetimibe (ZETIA) 10 mg tablet Take 1 tablet (10 mg total) by mouth daily. 90 tablet 3    fexofenadine (ALLEGRA ALLERGY) 180 MG tablet Take 1 tablet (180 mg total) by mouth daily as needed (allergies).      guaiFENesin (MUCINEX) 600 mg 12 hr tablet Take 1 tablet (600 mg total) by mouth every twelve (12) hours. (Patient not taking: Reported on 07/17/2022)      ibuprofen (ADVIL,MOTRIN) 200 MG tablet Take 3 tablets (600 mg total) by mouth daily as needed for pain.      lancets Misc Dispense 100 lancets, ok to sub any brand preferred by insurance/patient, use 3x/day 100 each 12    memantine (NAMENDA) 10 MG tablet Take 1 tablet (10 mg total) by mouth Two (2) times a day. 60 tablet 6    ofloxacin (OCUFLOX) 0.3 % ophthalmic solution Instill 4 drops in Fitzgerald ear twice daily for 5 days 5 mL 3    ondansetron (ZOFRAN) 8 MG tablet Take 1 tablet (8 mg total) by mouth Take as directed.       No current facility-administered medications for this visit.       REVIEW OF SYSTEMS  A complete review of systems was obtained and was negative except for those symptoms listed in the HPI     PHYSICAL EXAM  Vitals:    07/17/22 1548   BP: 135/85   Pulse: 76   Temp: 37.1 ??C (98.7 ??F)   SpO2: 95%       GEN: Awake and alert, pleasant appearing male in no acute distress  LUNGS: normal WOB, CTAB  CV: NAD, S1S2  ABD; obese, soft NTND  SKIN: No rashes, petechiae or jaundice noted.  All melanoma scarlines examined; no nodularity.    PYSCH: Alert and oriented to person, place and time  EXT: No edema noted of the lower extremity noted.          LABS  Lab Results   Component Value Date    WBC 7.1 05/30/2022    HGB 14.2 05/30/2022    HCT 43.4 05/30/2022    PLT 200 05/30/2022       Lab Results   Component Value Date    NA 143 05/30/2022    K 4.4 05/30/2022    CL 109 (H) 05/30/2022    CO2 26.0 05/30/2022    BUN 22 05/30/2022    CREATININE 0.99 05/30/2022    GLU 97 05/30/2022    CALCIUM 8.6 (L) 05/30/2022    MG 1.8 05/30/2022    PHOS 4.3 10/31/2021       Lab Results   Component Value Date    BILITOT 0.3 05/30/2022    BILIDIR 0.20 01/27/2020    PROT 7.0 05/30/2022    ALBUMIN 4.0 05/30/2022    ALT 43 05/30/2022    AST 33 05/30/2022    ALKPHOS 84 05/30/2022    GGT 42 08/22/2011       Lab Results   Component Value Date    INR 1.12 01/24/2020       RADIOLOGY RESULTS    MRI Brain W Wo Contrast    Result Date: 07/17/2022   Impression: Numerous bilateral T1 intrinsically hyperintense metastases are again noted throughout the bilateral cerebral and cerebellar hemispheres, overall stable compared to prior. No new lesion identified.     CT Chest W Contrast    Result Date: 07/17/2022   Impression: No thoracic  metastasis.3     MRI Abdomen W Contrast    Result Date: 07/17/2022   Impression: 1.  Stable size and appearance of previously-described T1 hyperintense foci within hepatic segments V/VIII. No new focal hepatic lesion identified. 2.  Contracted/decompressed gallbladder with internal sludge/small stones, unchanged. 3.  Additional chronic and incidental findings as above.       Colonoscopy 08/29/21:  Impression:            - Patent functional end-to-end colo-colonic                          anastomosis, characterized by healthy appearing mucosa.                         - Friability and scarring with contact bleeding at the ileocecal valve. Biopsied.                         - Pseudopolyps in the transverse colon. Biopsied.                         - Diverticulosis in the entire examined colon.                         - Erythematous, granular, edematous mucosa in the                          rectum. This is worse compared wtih prior and appears                          consistent with mildlly active Crohn's colitis.                          Biopsied to rule out melanoma.    08/29/21:  Diagnosis   A: Colon, biopsy:  -Severely active chronic colitis with erosion.     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       Echo 04/29/19:  Summary    1. The Fitzgerald ventricle is normal in size with normal wall thickness.    2. The Fitzgerald ventricular systolic function is normal, LVEF is visually  estimated at 55%.    3. The right ventricle is upper normal in size, with normal systolic  function.          PATHOLOGY:  Pelvic LN 01/09/18:  Final Diagnosis   A: Colon, transverse, biopsy  - Moderately-severely active chronic colitis  - No CMV viral cytopathic effect, granuloma, or dysplasia identified  - No metastatic melanoma or papillary thyroid cancer identified  - See comment     B: Lymph node, pelvic, core needle biopsy  - Metastatic melanoma  - See comment        FNA R femur 07/19/14:  : Bone, right femur, core biopsy with touch imprints  - No primary or metastatic malignancy identified  - Bone with focal osteocyte dropout, suggestive of necrosis  - Bone marrow with trilineage hematopoiesis and focal effacement by chronic inflammation (see comment)    B: Bone, right femur, fine needle aspiration  - Few atypical cells present on direct smears (see comment)  - Scant fragments of edematous stromal tissue in cell block  - Bone marrow with trilineage hematopoiesis  Diagnosis:  Lymph nodes, right cervical, levels 2-4, removal and partial parotidectomy  -Metastatic melanoma involving 1 out of 20 lymph nodes, with the largest  diameter measuring 10 mm (1.0 cm) and no evidence of extracapsular extension  (1/20)  -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

## 2022-07-22 ENCOUNTER — Telehealth: Admit: 2022-07-22 | Discharge: 2022-07-23 | Payer: PRIVATE HEALTH INSURANCE | Attending: Clinical | Primary: Clinical

## 2022-07-22 DIAGNOSIS — F331 Major depressive disorder, recurrent, moderate: Principal | ICD-10-CM

## 2022-07-22 DIAGNOSIS — F411 Generalized anxiety disorder: Principal | ICD-10-CM

## 2022-07-22 DIAGNOSIS — C801 Malignant (primary) neoplasm, unspecified: Principal | ICD-10-CM

## 2022-07-22 NOTE — Unmapped (Signed)
[facility]  Psychiatry  Psychotherapy Note - Telehealth via Video     Service Date: July 22, 2022  Service: 50 minutes of psychotherapy via video session  Time with Patient: 50 minutes     Encounter Description: This encounter was conducted from provider's office via EPIC video session due to COVID-19 pandemic. Kenneth Fitzgerald was located in his home. Session was conducted via EPIC.  Rationale for video session is need to social distance. See Plan for telemedicine consent/disclaimer.      Encounter Description/Consent:   Kenneth Fitzgerald visit was completed through telehealth encounter (video).      This patient encounter is appropriate and reasonable under the circumstances. The patient has been advised of the potential risks and limitations of this mode of treatment (including, but not limited to, the absence of in-person examination) and has agreed to be treated in a remote fashion in spite of them. Any and all of the patient's/patient's family's questions on this issue have been answered.      The patient was physically located in [place] in which I am permitted to provide care. The patient understood that he may incur co-pays and cost sharing, and agreed to the telemedicine visit. The visit was reasonable and appropriate under the circumstances given the patient's presentation at the time.      Time Spent: 50 minutes     Assessment:  Kenneth Fitzgerald is a 47yo male with a history of metastatic melanoma and thyroid cancer who I meet with for psychotherapy to address depression and adjustment to multiple stressors.  We met today via telehealth.  Kenneth Fitzgerald reported relatively good psychosocial functioning since our last session. He expressed feeling relieved that recent scans were stable.  Longstanding intrafamilial issues have been more manageable than before and he continues to benefit from part-time work.  I offered psychotherapy, support, and recommendations for addressing/tolerating distress and enhancing adaptive coping.  Will continue to follow and scheduled to meet next in four weeks.     Risk Assessment:  A suicide and violence risk assessment was performed as part of this evaluation. There patient is deemed to be at chronic elevated risk for self-harm/suicide given the following factors: current diagnosis of depression, suicidal ideation or threats without a plan and past diagnosis of depression. There patient is deemed to be at chronic elevated risk for violence given the following factors: male gender. These risk factors are mitigated by the following factors: lack of active SI/HI. There is no acute risk for suicide or violence at this time. The patient was educated about relevant modifiable risk factors including following recommendations for treatment of psychiatric illness and abstaining from substance abuse. While future psychiatric events cannot be accurately predicted, the patient does not currently require acute inpatient psychiatric care and does not currently meet [facility] involuntary commitment criteria.              Plan:  1. Will continue to see Kenneth Fitzgerald for psychotherapy, scheduled to meet next in four weeks.  2. Kenneth Fitzgerald has re-established care with [name], NP.  3. Kenneth Fitzgerald has information on available CCSP resources and he has worked with [name], [name].  I have previously reached out to [name] to explore financial resources.  4. Kenneth Fitzgerald knows to reach me as needed.     Subjective:   Kenneth Fitzgerald presented as alert, oriented, appropriately engaged, and presented with fair-to-bright affect.     Kenneth Fitzgerald reported largely good psychosocial functioning  in the time since our last session.  He noted having anticipatory anxiety leading up to scans, and then feeling relieved to learn that they indicated stable disease.  We discussed his thoughts on his prognosis; Kenneth Fitzgerald shared that he thinks about it some on most days, but is able to avoid perseverating on it.  He reported that the intrafamilial stressors have been more manageable lately.  He continues to support two friends who are undergoing cancer care, and feels good about his ability to support them.   Kenneth Fitzgerald continues to find his part-time work rewarding and fulfilling.     Kenneth Fitzgerald has re-established care with [name], NP and is taking Cymbalta.  He also continues to take Synthroid.     I provided Kenneth Fitzgerald support, understanding, praise, and psychoeducation.  We discussed and processed his thoughts and feelings in today in a therapeutic environment.  We reviewed and problem-solved ways to address and tolerate distress and to promote continued adaptation given the challenging situation.  Kenneth Fitzgerald was engaged and receptive.     We scheduled to meet next in four weeks.     Mental Status Exam:  Appearance:     Appears stated age   Motor:    No abnormal movements   Speech/Language:     Normal rate, volume, tone, fluency   Mood:   Okay   Affect:    Calm; cooperative; fair-to-bright   Thought process:    Logical, linear, clear, coherent, goal-directed   Thought content:      Denies SI/HI or thought of self-harm   Perceptual disturbances:      Behavior not concerning for response to internal stimuli      Orientation:    Oriented to person, place, time, and general circumstances   Attention:    Able to fully attend without fluctuations in consciousness   Concentration:    Able to fully concentrate and attend   Memory:    Immediate, short-term, long-term, and recall grossly intact    Fund of knowledge:     Consistent with level of education and development   Insight:      Intact   Judgment:     Intact   Impulse Control:    Intact      Diagnosis: Recurrent major depressive disorder, moderate;  Anxiety associated with cancer diagnosis     [name]. Dilraj Killgore PhD  July 22, 2022

## 2022-07-29 NOTE — Unmapped (Signed)
[facility] Clinical Assessment & Refill Coordination Note    Kenneth Kenneth Fitzgerald [facility], DOB: 26-Dec-1975  Phone: [phone] (home)     All above HIPAA information was verified with patient.     Was a Nurse, learning disability used for this call? No    Specialty Medication(s):   Hematology/Oncology: Kenneth Kenneth Fitzgerald     Current Outpatient Medications   Medication Sig Dispense Refill    acetaminophen (TYLENOL) 500 MG tablet Take 2 tablets (1,000 mg total) by mouth every eight (8) hours as needed for pain. 30 tablet 0    binimetinib ([name]) 15 mg tablet Take 3 tablets (45 mg total) by mouth two (2) times a day. 180 tablet 11    blood sugar diagnostic Strp Dispense 100 blood glucose test strips, ok to sub any brand preferred by insurance/patient, use 3x/day; dispense whatever brand matches with meter. 100 strip 12    blood-glucose meter kit Use as instructed; dispense 1 meter, whatever is preferred by insurance 1 each 1    calcitriol (ROCALTROL) 0.25 MCG capsule Take 1 capsule (0.25 mcg total) by mouth daily. 90 capsule 3    calcium carbonate 650 mg calcium (1,625 mg) tablet Take 1 tablet (650 mg elem calcium total) by mouth Three (3) times a day with a meal.      DULoxetine (CYMBALTA) 30 MG capsule Take 1 capsule (30 mg total) by mouth two (2) times a day. (Patient taking differently: Take 1 capsule (30 mg total) by mouth daily.) 180 capsule 3    encorafenib (BRAFTOVI) 75 mg capsule Take 6 capsules (450 mg total) by mouth daily. 180 capsule 11    ezetimibe (ZETIA) 10 mg tablet Take 1 tablet (10 mg total) by mouth daily. 90 tablet 3    fexofenadine (ALLEGRA ALLERGY) 180 MG tablet Take 1 tablet (180 mg total) by mouth daily as needed (allergies).      fluticasone propionate (FLONASE) 50 mcg/actuation nasal spray 1 spray into each nostril daily.      folic acid (FOLVITE) 1 MG tablet TAKE 1 TABLET BY MOUTH EVERY DAY 90 tablet 2    guaiFENesin (MUCINEX) 600 mg 12 hr tablet Take 1 tablet (600 mg total) by mouth every twelve (12) hours. (Patient not taking: Reported on 07/17/2022)      ibuprofen (ADVIL,MOTRIN) 200 MG tablet Take 3 tablets (600 mg total) by mouth daily as needed for pain.      lancets Misc Dispense 100 lancets, ok to sub any brand preferred by insurance/patient, use 3x/day 100 each 12    levothyroxine (SYNTHROID) 125 MCG tablet Take 375 mcg daily; please dispense 90-day supply 270 tablet 3    memantine (NAMENDA) 10 MG tablet Take 1 tablet (10 mg total) by mouth Two (2) times a day. 60 tablet 6    ofloxacin (OCUFLOX) 0.3 % ophthalmic solution Instill 4 drops in left ear twice daily for 5 days 5 mL 3    ondansetron (ZOFRAN) 8 MG tablet Take 1 tablet (8 mg total) by mouth Take as directed.      sulfaSALAzine (AZULFIDINE) 500 mg tablet Take 4 tablets (2,000 mg total) by mouth two (2) times a day. 240 tablet 11     No current facility-administered medications for this visit.        Changes to medications: Kenneth Fitzgerald reports no changes at this time.    Allergies   Allergen Reactions    Compazine [Prochlorperazine] Itching    Coconut Nausea And Vomiting  Changes to allergies: No    SPECIALTY MEDICATION ADHERENCE     Braftovi 75 mg: 7 days of medicine on hand   [name] 15 mg: 7 days of medicine on hand   Medication Adherence    Patient reported X missed doses in the last month: 0  Specialty Medication: Braftovi 75mg   Patient is on additional specialty medications: Yes  Additional Specialty Medications: [name] 15mg   Patient Reported Additional Medication X Missed Doses in the Last Month: 0          Specialty medication(s) dose(s) confirmed: Regimen is correct and unchanged.     Are there any concerns with adherence? No    Adherence counseling provided? Not needed    CLINICAL MANAGEMENT AND INTERVENTION      Clinical Benefit Assessment:    Do you feel the medicine is effective or helping your condition? Yes    Clinical Benefit counseling provided? Not needed    Adverse Effects Assessment:    Are you experiencing any side effects? No    Are you experiencing difficulty administering your medicine? No    Quality of Life Assessment:    Quality of Life      Oncology  1. What impact has your specialty medication had on the reduction of your daily pain or discomfort level?: None  2. On a scale of 1-10, how would you rate your ability to manage side effects associated with your specialty medication? (1=no issues, 10 = unable to take medication due to side effects): 1            How many days over the past month did your condition/medication  keep you from your normal activities? For example, brushing your teeth or getting up in the morning. 0    Have you discussed this with your provider? Not needed    Acute Infection Status:    Acute infections noted within Epic:  No active infections  Patient reported infection: None    Therapy Appropriateness:    Is therapy appropriate and patient progressing towards therapeutic goals? Yes, therapy is appropriate and should be continued    DISEASE/MEDICATION-SPECIFIC INFORMATION      N/A    Oncology: Is the patient receiving adequate infection prevention treatment? Not applicable  Does the patient have adequate nutritional support? Not applicable    PATIENT SPECIFIC Kenneth Fitzgerald     Does the patient have any physical, cognitive, or cultural barriers? No    Is the patient high risk? Yes, patient is taking oral chemotherapy. Appropriateness of therapy as been assessed    Did the patient require a clinical intervention? No    Does the patient require physician intervention or other additional services (i.e., nutrition, smoking cessation, social work)? No    SOCIAL DETERMINANTS OF HEALTH     At the [facility], we have learned that life circumstances - like trouble affording food, housing, utilities, or transportation can affect the health of many of our patients.   That is why we wanted to ask: are you currently experiencing any life circumstances that are negatively impacting your health and/or quality of life? No    Social Determinants of Health     Financial Resource Strain: Low Risk  (11/05/2021)    Overall Financial Resource Strain (CARDIA)     Difficulty of Paying Living Expenses: Not very hard   Internet Connectivity: No Internet connectivity concern identified (11/05/2021)    Internet Connectivity     Do you have access to internet services: Yes  How do you connect to the internet: Personal Device at home     Is your internet connection strong enough for you to watch video on your device without major problems?: Yes     Do you have enough data to get through the month?: Yes     Does at least one of the devices have a camera that you can use for video chat?: Yes   Food Insecurity: No Food Insecurity (11/05/2021)    Hunger Vital Sign     Worried About Running Out of Food in the Last Year: Never true     Ran Out of Food in the Last Year: Never true   Tobacco Use: Low Risk  (06/02/2022)    Patient History     Smoking Tobacco Use: Never     Smokeless Tobacco Use: Never     Passive Exposure: Not on file   Housing/Utilities: Low Risk  (11/05/2021)    Housing/Utilities     Within the past 12 months, have you ever stayed: outside, in a car, in a tent, in an overnight shelter, or temporarily in someone else's home (i.e. couch-surfing)?: No     Are you worried about losing your housing?: No     Within the past 12 months, have you been unable to get utilities (heat, electricity) when it was really needed?: No   Alcohol Use: Not At Risk (11/05/2021)    Alcohol Use     How often do you have a drink containing alcohol?: Monthly or less     How many drinks containing alcohol do you have on a typical day when you are drinking?: 1 - 2     How often do you have 5 or more drinks on one occasion?: Never   Transportation Kenneth Fitzgerald: No Transportation Kenneth Fitzgerald (11/05/2021)    PRAPARE - Transportation     Lack of Transportation (Medical): No     Lack of Transportation (Non-Medical): No   Substance Use: Low Risk (11/05/2021)    Substance Use     Taken prescription drugs for non-medical reasons: Never     Taken illegal drugs: Never     Patient indicated they have taken drugs in the past year for non-medical reasons: Yes, [positive answer(s)]: Not on file   Health Literacy: Low Risk  (11/05/2021)    Health Literacy     : Never   Physical Activity: Inactive (11/05/2021)    Exercise Vital Sign     Days of Exercise per Week: 0 days     Minutes of Exercise per Session: 0 min   Interpersonal Safety: Not At Risk (11/05/2021)    Interpersonal Safety     Unsafe Where You Currently Live: No     Physically Hurt by Anyone: No     Abused by Anyone: No   Stress: Stress Concern Present (11/05/2021)    [employer] of Occupational Health - Occupational Stress Questionnaire     Feeling of Stress : To some extent   Intimate Partner Violence: Not At Risk (11/05/2021)    Humiliation, Afraid, Rape, and Kick questionnaire     Fear of Current or Ex-Partner: No     Emotionally Abused: No     Physically Abused: No     Sexually Abused: No   Depression: Not at risk (11/12/2021)    PHQ-2     PHQ-2 Score: 1   Social Connections: Moderately Isolated (11/05/2021)    Social Connection and Isolation Panel [NHANES]     Frequency of Communication  with Friends and Family: More than three times a week     Frequency of Social Gatherings with Friends and Family: Once a week     Attends Religious Services: More than 4 times per year     Active Member of [employer] or Organizations: No     Attends Banker Meetings: Never     Marital Status: Never married       Would you be willing to receive help with any of the Kenneth Fitzgerald that you have identified today? Not applicable       SHIPPING     Specialty Medication(s) to be Shipped:   Hematology/Oncology: [name] and [name]    Other medication(s) to be shipped: No additional medications requested for fill at this time     Changes to insurance: No    Delivery Scheduled: Yes, Expected medication delivery date: 08/01/22.     Medication will be delivered via UPS to the confirmed prescription address in [facility].    The patient will receive a drug information handout for each medication shipped and additional FDA Medication Guides as required.  Verified that patient has previously received a Conservation officer, historic buildings and a Surveyor, mining.    The patient or caregiver noted above participated in the development of this care plan and knows that they can request review of or adjustments to the care plan at any time.      All of the patient's questions and concerns have been addressed.    Rollen Sox, [facility]   [facility] Specialty Pharmacist

## 2022-07-31 NOTE — Unmapped (Signed)
Spoke with patient. Delivery now scheduled for 7/24 via ups to prescription address. No questions for the pharmacist.

## 2022-08-06 MED FILL — MEKTOVI 15 MG TABLET: ORAL | 30 days supply | Qty: 180 | Fill #9

## 2022-08-06 MED FILL — BRAFTOVI 75 MG CAPSULE: ORAL | 30 days supply | Qty: 180 | Fill #9

## 2022-08-20 DIAGNOSIS — F411 Generalized anxiety disorder: Principal | ICD-10-CM

## 2022-08-20 DIAGNOSIS — C801 Malignant (primary) neoplasm, unspecified: Principal | ICD-10-CM

## 2022-08-20 DIAGNOSIS — F3341 Major depressive disorder, recurrent, in partial remission: Principal | ICD-10-CM

## 2022-08-20 MED ORDER — DULOXETINE 30 MG CAPSULE,DELAYED RELEASE
ORAL_CAPSULE | Freq: Two times a day (BID) | ORAL | 2 refills | 0 days
Start: 2022-08-20 — End: ?

## 2022-08-21 NOTE — Unmapped (Signed)
Pt is requesting refill    Most recent clinic visit: 07/17/2022  Next clinic visit:  10/30/2022

## 2022-08-27 ENCOUNTER — Telehealth: Admit: 2022-08-27 | Discharge: 2022-08-28 | Payer: PRIVATE HEALTH INSURANCE | Attending: Clinical | Primary: Clinical

## 2022-08-27 DIAGNOSIS — C801 Malignant (primary) neoplasm, unspecified: Principal | ICD-10-CM

## 2022-08-27 DIAGNOSIS — F331 Major depressive disorder, recurrent, moderate: Principal | ICD-10-CM

## 2022-08-27 DIAGNOSIS — F411 Generalized anxiety disorder: Principal | ICD-10-CM

## 2022-08-27 NOTE — Unmapped (Signed)
[facility]  Psychiatry  Psychotherapy Note - Telehealth via Video     Service Date: August 27, 2022  Service: 48 minutes of psychotherapy via video session  Time with Patient: 48 minutes     Encounter Description: This encounter was conducted from provider's office via EPIC video session due to COVID-19 pandemic. Kenneth Fitzgerald was located in his home. Session was conducted via EPIC.  Rationale for video session is need to social distance. See Plan for telemedicine consent/disclaimer.      Encounter Description/Consent:   Kenneth Fitzgerald visit was completed through telehealth encounter (video).      This patient encounter is appropriate and reasonable under the circumstances. The patient has been advised of the potential risks and limitations of this mode of treatment (including, but not limited to, the absence of in-person examination) and has agreed to be treated in a remote fashion in spite of them. Any and all of the patient's/patient's family's questions on this issue have been answered.      The patient was physically located in [place] in which I am permitted to provide care. The patient understood that he may incur co-pays and cost sharing, and agreed to the telemedicine visit. The visit was reasonable and appropriate under the circumstances given the patient's presentation at the time.      Time Spent: 48 minutes     Assessment:  Kenneth Fitzgerald is a 47yo male with a history of metastatic melanoma and thyroid cancer who I see for psychotherapy to address depression and adjustment to multiple stressors.  We met this afternoon via telehealth.  Kenneth Fitzgerald reported relatively good psychosocial functioning and denied emergent psych concerns.  He remains bouyed by recent scans and is managing longstanding intrafamilial issues.  I offered support, psychoeducation, and recommendations for addressing/tolerating distress and enhancing adaptive coping.  Will continue to follow and scheduled to meet next in ~ four weeks.     Risk Assessment:  A suicide and violence risk assessment was performed as part of this evaluation. There patient is deemed to be at chronic elevated risk for self-harm/suicide given the following factors: current diagnosis of depression, suicidal ideation or threats without a plan and past diagnosis of depression. There patient is deemed to be at chronic elevated risk for violence given the following factors: male gender. These risk factors are mitigated by the following factors: lack of active SI/HI. There is no acute risk for suicide or violence at this time. The patient was educated about relevant modifiable risk factors including following recommendations for treatment of psychiatric illness and abstaining from substance abuse. While future psychiatric events cannot be accurately predicted, the patient does not currently require acute inpatient psychiatric care and does not currently meet [facility] involuntary commitment criteria.              Plan:  1. Will continue to see Kenneth Fitzgerald for psychotherapy, scheduled to meet next on 9/12.  2. Kenneth Fitzgerald has re-established care with [name], NP.  3. Kenneth Fitzgerald has information on available CCSP resources and he has worked with [name], [name].  I have previously reached out to [name] to explore financial resources.  4. Kenneth Fitzgerald knows to reach me as needed.     Subjective:   Kenneth Fitzgerald presented as alert, oriented, appropriately engaged, and presented with fair-to-bright affect.     Kenneth Fitzgerald reported fairly good overall psychosocial functioning since our last session and denied pressing psych concerns.  He remains  bouyed by recent scan results, while still very much aware of concerns about his prognosis.  Regarding this, Kenneth Fitzgerald is mindful of it and it does shape some of his decision-making, but is mostly able to avoid perseverating on it.  He again noted that intrafamilial stressors have been more manageable lately.  Kenneth Fitzgerald continues to find his part-time work rewarding and fulfilling; he is on the verge of a promotion.     Kenneth Fitzgerald has re-established care with [name], NP and is taking Cymbalta.  He also continues to take Synthroid.     I provided Kenneth Fitzgerald support, understanding, praise, and psychoeducation.  We discussed and processed his thoughts, feelings, and experiences in a therapeutic environment.  We reviewed approaches to address and tolerate distress, and to promote continued adaptation given his situation.  Kenneth Fitzgerald was engaged and receptive.     We scheduled to meet next in ~ four weeks.     Mental Status Exam:  Appearance:     Appears stated age   Motor:    No abnormal movements   Speech/Language:     Normal rate, volume, tone, fluency   Mood:   alright   Affect:    Calm; cooperative; fair-to-bright   Thought process:    Logical, linear, clear, coherent, goal-directed   Thought content:      Denies SI/HI or thought of self-harm   Perceptual disturbances:      Behavior not concerning for response to internal stimuli      Orientation:    Oriented to person, place, time, and general circumstances   Attention:    Able to fully attend without fluctuations in consciousness   Concentration:    Able to fully concentrate and attend   Memory:    Immediate, short-term, long-term, and recall grossly intact    Fund of knowledge:     Consistent with level of education and development   Insight:      Intact   Judgment:     Intact   Impulse Control:    Intact      Diagnosis: Recurrent major depressive disorder, moderate;  Anxiety associated with cancer diagnosis     Kenneth Fitzgerald. Kenneth Rowlette PhD  August 27, 2022

## 2022-08-29 NOTE — Unmapped (Signed)
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 Reznick is here today for his Single fraction SRS.  We are planning a total of 1 fractions using CyberKnife.    All appropriate QA measures were taken to assure treatment accuracy:  Tracking verified, Shifts were applied as needed per review of the anatomy, Treatment parameters were verified to be appropriate, and Set up and all is OK to proceeed with RT as planned  Appropriate image guidance is performed.    Treatment Site: Brain--R cingulate gyrus and left frontal lobe x 2 (adjacent to each other)     Tumor Volume Fx dose: 2000 cGy    Total dose thus far received: 2000 cGy    The treatment was delivered without incident.     Patient will return for Follow-Up: FU brain MRI in 2-3 months       Paulla Fore, MD  05/07/2022

## 2022-08-29 NOTE — Unmapped (Signed)
[facility] Refill Coordination Note    Specialty Medication(s) to be Shipped:   Hematology/Oncology: Braftovi 75mg  and [name] 15mg     Other medication(s) to be shipped: No additional medications requested for fill at this time     Kenneth Fitzgerald, DOB: [birth year]  Phone: [phone] (home)       All above HIPAA information was verified with patient.     Was a Nurse, learning disability used for this call? No    Completed refill call assessment today to schedule patient's medication shipment from the [facility] [phone]).  All relevant notes have been reviewed.     Specialty medication(s) and dose(s) confirmed: Regimen is correct and unchanged.   Changes to medications: Kenneth Fitzgerald reports no changes at this time.  Changes to insurance: No  New side effects reported not previously addressed with a pharmacist or physician: None reported  Questions for the pharmacist: No    Confirmed patient received a Conservation officer, historic buildings and a Surveyor, mining with first shipment. The patient will receive a drug information handout for each medication shipped and additional FDA Medication Guides as required.       DISEASE/MEDICATION-SPECIFIC INFORMATION        N/A    SPECIALTY MEDICATION ADHERENCE     Medication Adherence    Patient reported X missed doses in the last month: 0  Specialty Medication: BRAFTOVI 75 mg capsule (encorafenib)  Patient is on additional specialty medications: Yes  Additional Specialty Medications: [name] 15 mg tablet (binimetinib)  Patient Reported Additional Medication X Missed Doses in the Last Month: 0  Patient is on more than two specialty medications: No  Informant: patient              Were doses missed due to medication being on hold? No    Braftovi 75 mg: 10 days of medicine on hand   [name] 15 mg: 10 days of medicine on hand       REFERRAL TO PHARMACIST     Referral to the pharmacist: Not needed      [facility]     Shipping address confirmed in Epic.       Delivery Scheduled: Yes, Expected medication delivery date: 09/04/22.     Medication will be delivered via UPS to the prescription address in Epic WAM.    Kenneth Fitzgerald   [facility] Specialty Technician

## 2022-08-30 ENCOUNTER — Ambulatory Visit
Admit: 2022-08-30 | Discharge: 2022-08-31 | Payer: PRIVATE HEALTH INSURANCE | Attending: Student in an Organized Health Care Education/Training Program | Primary: Student in an Organized Health Care Education/Training Program

## 2022-08-30 DIAGNOSIS — M459 Ankylosing spondylitis of unspecified sites in spine: Principal | ICD-10-CM

## 2022-08-30 DIAGNOSIS — C7931 Secondary malignant neoplasm of brain: Principal | ICD-10-CM

## 2022-08-30 DIAGNOSIS — K5010 Crohn's disease of large intestine without complications: Principal | ICD-10-CM

## 2022-08-30 DIAGNOSIS — E559 Vitamin D deficiency, unspecified: Principal | ICD-10-CM

## 2022-08-30 DIAGNOSIS — C434 Malignant melanoma of scalp and neck: Principal | ICD-10-CM

## 2022-08-30 DIAGNOSIS — M2550 Pain in unspecified joint: Principal | ICD-10-CM

## 2022-08-30 DIAGNOSIS — E782 Mixed hyperlipidemia: Principal | ICD-10-CM

## 2022-08-30 DIAGNOSIS — C73 Malignant neoplasm of thyroid gland: Principal | ICD-10-CM

## 2022-08-30 DIAGNOSIS — F3341 Major depressive disorder, recurrent, in partial remission: Principal | ICD-10-CM

## 2022-08-30 LAB — LIPID PANEL
CHOLESTEROL/HDL RATIO SCREEN: 7.3 — ABNORMAL HIGH (ref 1.0–4.5)
CHOLESTEROL: 261 mg/dL — ABNORMAL HIGH (ref <=200)
HDL CHOLESTEROL: 36 mg/dL — ABNORMAL LOW (ref 40–60)
LDL CHOLESTEROL CALCULATED: 145 mg/dL — ABNORMAL HIGH (ref 40–99)
NON-HDL CHOLESTEROL: 225 mg/dL — ABNORMAL HIGH (ref 70–130)
TRIGLYCERIDES: 398 mg/dL — ABNORMAL HIGH (ref 0–150)
VLDL CHOLESTEROL CAL: 79.6 mg/dL — ABNORMAL HIGH (ref 11–50)

## 2022-08-30 LAB — RHEUMATOID FACTOR, QUANT: RHEUMATOID FACTOR: 9.2 [IU]/mL (ref <14.0)

## 2022-08-30 MED ORDER — EZETIMIBE 10 MG TABLET
ORAL_TABLET | Freq: Every day | ORAL | 3 refills | 90 days | Status: CP
Start: 2022-08-30 — End: 2023-08-30

## 2022-08-30 NOTE — Unmapped (Signed)
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  -     Lipid Panel    Vitamin D deficiency  -     Vitamin D 25 Hydroxy (25OH D2 + D3)    Arthralgia, unspecified joint  -     Anti-Nuclear Antibody (ANA)  -     Cyclic Citrul Peptide Antibody, IgG  -     Rheumatoid Factor  -     Extractable Nuclear Antigen    Ankylosing spondylitis, unspecified site of spine (CMS-HCC)  -     Anti-Nuclear Antibody (ANA)  -     Cyclic Citrul Peptide Antibody, IgG  -     Rheumatoid Factor  -     Extractable Nuclear Antigen    Immunization declined    Malignant melanoma, unspecified site (CMS-HCC)    ----------------------------------------------------------------------  47 yo male presents for follow up     His PMH is complicated by: Stage IV Malignant melanoma, brain metastasis, portal vein thrombosis, Thyroid cancer, hypothyroidism, hypocalcemia, Atypical Crohns  -He is following with Complex case management, Psychiatry, Hematology/Oncology, Endocrinology, ENT, GI    Malignant Melanoma, Metastasis to brain  -He is following with Heme/onc, Surgical oncology and Neurosurgery.  -Last visit with Hematology/onc: PLAN  1. Stage IV melanoma: BRAF+ by IHC. Dr Eyvonne Left reviewed path: TILS present, non-brisk; lymphocytes are 20 to 30% the number of melanoma cells.  Known sites of disease include abdominal/pelvic LN, and brain. Started dabrafenib/trametinib mid-May 2020, has had stable disease extracranially.   - Reviewed path from hernia surgery in Jan 2022, noted that the abdominal wall/peritoneal implants contained melanophages, but no melanoma cells.  We discussed that this is interesting, good to know that there wasn't melanoma in the biopsy, is in keeping w/immune response.    - He is s/p WBRT, completed 01/20/20.   - He completed SRS to 3 progressive brain mets 04/2022 w/good tolerability  - Personally reviewed CT chest, MRI brain, abdomen today. There is no evidence of progression.    - Cont BRAF/MEK targeted therapy.    - RTC in 3mos for clinical follow up and repeat imaging.  -Of note, we have attempted to confirm BRAF on NGS twice, on original primary specimen from July 2012 and and pelvic LN sample from 01/09/2018.  Both samples were insufficient tissue for analysis.  At this point, BRAF is known by IHC only.   - cont local dermatology follow up  -Currently prescribed: Binimetinib 45mg  BID, Encorafenib 450mg  every day, Memantine 10mg  BID     Thyroid cancer, hypothyroidism, hypocalcemia  -Following closely with Endocrinology, next appointment on 10/07/2022  -Currently taking: Levothyroxine every day, Calcitriol 0.25mg  every day, Calcium carbonate 650mg  TID    Hypertension  BP Readings from Last 3 Encounters:   08/30/22 140/90   07/17/22 135/85   05/30/22 138/90   -His BP continues to remain above JNC-8 goal of <140/90.   -He has declined initiation of anti-hypertensive    Atherosclerotic Calcification, Hyperlipidemia  -ASCVD risk 5.9%.  -He has not been taking Zeita. He does not  want to take statin due to concern for ADE.   -Rechecking Lipid panel and Zeita resent to pharmacy    Iron Deficiency Anemia  -Received Iron infusion in 11/2021  -Stable, continue to monitor     Atypical Crohn's  -Following with GI for Atypical Crohn's. Last Colonoscopy was 08/29/2021. Last office visit on 05/25/2020:  RECOMMENDATIONS AND PLAN:  Patient Instructions   -Continue sulfasalazine 1g by mouth twice daily  -Continue folic acid once daily.  -Upper endoscopy and flexible sigmoidoscopy to look for causes of bleeding. Only need to do enema prep. No oral prep.  -Clinic with me in 1 year or sooner if needed.  -He reports that he might be having a Crohn's flare. However unsure if this is a true flare?   -MRI abdomen with contrast from 07/17/2022 which noted: No bowel obstruction. No acute inflammatory process No ascites. Colonic diverticulosis.   -Encouraged to reach out to GI for follow up as has not been seen in > 1 year (it was advised previously for him to follow up with GI on 11/2021  -Currently prescribed Sulfasalazine 2000mg  BID. Deferred steroids at this time    Depression  -Continue to follow closely with psychiatry with last visit on 08/27/2022.   -He reports that he is tapering off Duloxetine as not providing much benefit. Currently taking Duloxetine 30mg  every day       Arthralgia, unspecified joint, Myalgias, Ankylosing spondylitis, unspecified site of spine (CMS-HCC)  -He does have history of Ankylosing spondylitis in 2009/2010. He was treated with Prednisone and Enbrel  -His muscle cramps have improved but he is now having more joint pain. Believe to be from medication. However Discussed pursuing autoimmune work up vs monitoring. He does want to pursue work up. Will obtain labs listed below for initial evaluation   -     Anti-Nuclear Antibody (ANA)  -     Cyclic Citrul Peptide Antibody, IgG  -     Rheumatoid Factor  -     Extractable Nuclear Antigen      Preventative health   -Declined all vaccinations   -Will consider obtaining PSA prior to 50  -history of portal vein thrombosis---following with hematology: thought to have been from malignant thrombus--no anticoagulation recommended  -history of Low Back pain, Radiculopathy---no concerns at this time      Chronic care follow up in 3-4 mnths       Subjective:     Kenneth Fitzgerald 47 y.o.male  is here for follow up     SINCE LAST VISIT  -he had another round radiation/cyber knife therapy. During this, he was laying in an odd position and had  bilateral shoulder impingement and adhesive capsulitis on the right   -He is getting more frequent joint pain and energy level is quite low     He did not start the Zeita    He is going to taper off the Cymbalta, does not feel like he needs it as much anymore    He is not sure when he will see GI again   He is having abdominal pain but no melena or hematochezia    He has had emesis but reports related to sinus   Abdominal pain is not severe, 2/3   Intermittent, not constant   Generalized abdominal pain     Denies issues urinating       Since left hand pain for three days, no injury or trauma  He does note decreased strength change and joint  swelling.     He had Ankylosiing spondylitis before cancer   He will not re-start his previous medication Enbrel    His muscle cramping are not as bad     ROS:     Review of systems negative unless otherwise noted as per HPI  Denies: Chest pain, palpitations, shortness of breath, lightheadedness, dizziness, shortness of breath, syncope/presyncope      Vital Signs:     Wt Readings from Last 3 Encounters:   08/30/22 (!) 143.2 kg (315 lb 12.8 oz)   07/17/22 (!) 142 kg (313 lb 1.6 oz)   07/17/22 (!) 141.5 kg (311 lb 14.4 oz)     Temp Readings from Last 3 Encounters:   08/30/22 36.7 ??C (98.1 ??F) (Temporal)   07/17/22 37.1 ??C (98.7 ??F) (Temporal)   05/30/22 36.7 ??C (98 ??F) (Temporal)     BP Readings from Last 3 Encounters:   08/30/22 140/90   07/17/22 135/85   05/30/22 138/90     Pulse Readings from Last 3 Encounters:   08/30/22 102   07/17/22 76   05/30/22 62       Objective:      General Appearance: Alert, cooperative, in no distress  HENT: Normocephalic, atraumatic, EOMI, conjunctiva clear, no nasal congestion   CV: Tachycardic, Regular rhythm.   RESP: Normal respiratory effort.  Clear to auscultation bilaterally without wheezes, rhonchi or crackles.   EXT:  No significant lower extremity edema.   MSK: gait is stable  Abdomen: Soft, not distended  SKIN: no rashes on exposed skin  NEURO: Cranial nerves II- XII grossly intact.  No focal neurologic deficits. AOx3  PSYCHIATRIC: Pleasant

## 2022-08-30 NOTE — Unmapped (Signed)
CM will not call patient d/t patient being contacted within 90 days.

## 2022-09-02 LAB — CYCLIC CITRUL PEPTIDE ANTIBODY, IGG
CCP ANTIBODIES: <1 {ELISA'U} (ref <7.0)
CCP IGG ANTIBODIES: NEGATIVE

## 2022-09-02 LAB — ANA: ANTINUCLEAR ANTIBODIES (ANA): NEGATIVE

## 2022-09-02 LAB — EXTRACTABLE NUCLEAR ANTIGEN: ENA SCREEN: <0.1 ENA Units (ref <0.70)

## 2022-09-02 NOTE — Unmapped (Signed)
Patient called with update. He is worried that he may be showing signs of a possible bowel obstruction and does not want to end up in the hospital with NG tube.    4-5days ago had worsening abdominal and back pain, described as mild. Pain has not changed, no better or worse since start. Located on left quadrant primarily.     1 episode of vomiting on 8/18 that more severe than normal.  He does throw up occasionally due to increased sinus drainage.      He usually has 1 BM per day, last was 3pm yesterday so some slowing of GI system BUT he has also decreased oral intake. He is passing gas.      He has limited his diet to soft/liquid but is still able to eat and drink without issue.      No noticeable protrusion from abdomen which has occurred in the past with need for hernia repairs    Abdominal MRI was done ago, no signs of narrowing or inflammation.     Discussed with patient to continue with soft, liquid diet for now.  Will discuss with Dr. Stevphen Rochester.

## 2022-09-03 LAB — VITAMIN D 25 HYDROXY: VITAMIN D, TOTAL (25OH): 23.8 ng/mL (ref 20.0–80.0)

## 2022-09-03 MED FILL — MEKTOVI 15 MG TABLET: ORAL | 30 days supply | Qty: 180 | Fill #10

## 2022-09-03 MED FILL — BRAFTOVI 75 MG CAPSULE: ORAL | 30 days supply | Qty: 180 | Fill #10

## 2022-09-04 NOTE — Unmapped (Signed)
Per Dr. Stevphen Rochester, symptoms probably adhesional, recommend liquid/soft diet until symptoms resolve, ER for severe symptoms. Follow up appt needed with Dr. Stevphen Rochester, patient will be contacted by [facility] central scheduling to schedule.

## 2022-09-25 ENCOUNTER — Telehealth
Admit: 2022-09-25 | Discharge: 2022-09-26 | Payer: PRIVATE HEALTH INSURANCE | Attending: Nurse Practitioner | Primary: Nurse Practitioner

## 2022-09-25 DIAGNOSIS — C434 Malignant melanoma of scalp and neck: Principal | ICD-10-CM

## 2022-09-25 DIAGNOSIS — Z7689 Persons encountering health services in other specified circumstances: Principal | ICD-10-CM

## 2022-09-25 DIAGNOSIS — C7931 Secondary malignant neoplasm of brain: Principal | ICD-10-CM

## 2022-09-25 DIAGNOSIS — F32A Depressive disorder: Principal | ICD-10-CM

## 2022-09-25 DIAGNOSIS — R530 Neoplastic (malignant) related fatigue: Principal | ICD-10-CM

## 2022-09-25 DIAGNOSIS — Z713 Dietary counseling and surveillance: Principal | ICD-10-CM

## 2022-09-25 DIAGNOSIS — C73 Malignant neoplasm of thyroid gland: Principal | ICD-10-CM

## 2022-09-25 DIAGNOSIS — Z719 Counseling, unspecified: Principal | ICD-10-CM

## 2022-09-25 DIAGNOSIS — M2550 Pain in unspecified joint: Principal | ICD-10-CM

## 2022-09-25 NOTE — Unmapped (Signed)
The patient reports they are currently: at home. I spent 60 minutes on the real-time audio and video with the patient on the date of service. I spent an additional 15 minutes on pre- and post-visit activities on the date of service.     The patient was physically located in [place] or a state in which I am permitted to provide care. The patient understood that he may incur co-pays and cost sharing, and agreed to the telemedicine visit. The visit was reasonable and appropriate under the circumstances given the patient's presentation at the time.    The patient has been advised of the potential risks and limitations of this mode of treatment (including, but not limited to, the absence of in-person examination) and has agreed to be treated using telemedicine. The patient's/patient's family's questions regarding telemedicine have been answered.     If the visit was completed in an ambulatory setting, the patient has also been advised to contact their provider???s office for worsening conditions, and seek emergency medical treatment and/or call 911 if the patient deems either necessary.    [facility]  [facility] New Patient Consultation Note    ------------------------------------------------------------------------------------------------    Assessment and Plan    Assessment:   Kenneth Fitzgerald referred by Dr. [name] with [facility] for assessment and guidance with integrative and lifestyle medicine strategies that may help enhance his overall health and well-being while living with metastatic melanoma. Kenneth Fitzgerald was initially diagnosed with Stage III melanoma on his scalp in 2012 and underwent a a WLE, neck dissection and a year of high dose interferon treatments that were completed 01/2012. He had a local neck recurrence that led to another right neck dissection and subsequent radiation therapy in 2015. He was also diagnosed with thyroid cancer shortly after his initial melanoma diagnosis and underwent a thyroidectomy, as well as RAI treatment for a recurrence in 2015. In 2020 he was treated with Nivolumab that was complicated by colitis. Previously treated with dabrafenib/trametinib and then [name]/Braftovi in 2020, which he is currently receiving. For brain mets, he received WBRT in 2022 and early this year he received radiation to 3 progressive brain mets, which was completed 05/07/22. At this time his disease appears to be stable.    Experiencing fatigue most days, as well as diffuse joint pains that he feels have worsened since being on targeted therapies. Reports that the pains are often random without any identifying triggers. Taking Ibuprofen with some relief. Was found to have right shoulder adhesive capsulitis and completed PT about a week ago. Able to move his right arm more freely now, but still has occasional discomfort in the shoulder. Feels his sleep has improved recently and typically he obtains 7 hours of sleep.     He reports having lost some weight, but would like to lose about 35 more pounds realizing it would help his various chronic health issues. He does get out to walk his two dogs, but not exercising intentionally on a routine basis. Not engaging in any regular muscle strengthening exercises. 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 Has tried to make some dietary changes, such as switching to diet soda as opposed to regular sodas and has lowered his red meat intake. Feels he could do better with fruits and vegetables. Convenience is often a factor as he is working part-time and he lives alone so he needs to [employer] for himself. Eats yogurt, chicken and occasional  salmon as protein sources. He is taking zetia for hyperlipidemia as he did not want to take a statin. Used fish oil supplement in the past, but stopped due to having to take so many medications. Regarding cancer directed treatments, he has read that ivermectin may be helpful for melanoma and is curious about this as a treatment for his disease.    Had been taking Cymbalta for depression/anxiety, but he weaned himself off of it about a month ago. Does not feel he needs it as this point. Feels his stress level has decreased significantly after changing jobs. Has ongoing emotional counseling support from Dr. Electa Sniff and will be seeing Dr. [name] next month as well for emotional health support/medication discussion.    Laboratory Data:  All pertinent labs reviewed.     Hematocrit/Hemoglobin - 42.8/14.3 on 07/17/22  Serum vitamin D 25-hydroxy - 23.8 on 08/30/22  TSH/T4 - slightly elevated TSH 5.233/T4 WNL  Lipids: Total cholesterol - elevated at 261; triglycerides elevated at 398; HDL low at 36 and LDL elevated at 145 on on 2/95/62    Plan:     1) Cancer-related Fatigue:  Recommendations with clinical evidence for Cancer-Related Fatigue (CRF) during treatment and/or after treatments (ordered by level of clinical efficacy with the highest to lowest)    a. Physical Activity - both aerobic and strength training has the highest level of evidence for CRF than any other strategy. During treatment aim for at least 30 minutes three times/week or 45 minutes twice weekly of aerobic exercise and strength training 1-2 days/week.     Encouraged walking for exercise on a routine basis. Consider the [employer] [facility] exercise program for cancer survivors if available close to home.    b. Acupuncture - Once a week for 6 or more weeks.    Provided with suggestions on acupuncturists in the [place] area. Added to AVS.    c. Cognitive Behavioral Therapy and Hypnosis (with a qualified provider) - Once per week for 4 or more weeks or immediately before chemo and radiation treatments throughout care or 1-3x daily for self-hypnosis.    d. Gentle Yoga - One to two 60 to 90-minute classes per week for 12 weeks and home-based daily practice.    2) Diffuse arthralgias:  Improving nutrition and more physical movement may help.  Also, recommend acupuncture, which has evidence for reducing musculoskeletal pain.    3) Safe weight loss   Discussed the role of both a healthy diet and exercise for weight loss. See below for nutrition and physical activity information and provided with suggestions for SMART goals.    4) Nutrition (Food as Medicine):  Nutrition counseling and intervention offered included:  general nutrition guidance and education on aiming for a predominantly plant-based, whole foods diet, such as a Mediterranean-style diet.    Provided with nutrition information in AVS, including tips for meal shopping and prep, along with healthy recipes.    5) Physical Activity:  Exercise counseling and intervention offered included: benefits of routine exercise on overall health and as it relates to cancer.  Discussed the myriad of benefits from regular exercise, including improving energy, mood, sleep, etc. as well as the relationship between exercise and decreased mortality for some cancers and all-cause mortality. Provided with the general recommendations for cancer patients of aiming for at least 150 minutes of moderate aerobic activity a week and strength training of all major muscle groups at least twice/week, as well as regular stretching for improved flexibility.  6) History of depression:  To continue with ongoing support from Dr. Electa Sniff, as well as emotional health support from Dr. [name] with [facility].    7) Other/Supplements:  Consider the use of a quality daily men's multivitamin if unable to obtain sufficient nutrients through a predominantly plant-based, whole foods diet. Provided guidance on finding a quality brand MVI.    Recommended that patient speak with his endocrinologist about the possibility of increasing Vitamin D dose as most recent level was in the low normal range.    Would not recommend Ivermectin at this time as there is very limited data on its role for melanoma, which is from a mouse model.      I personally spent 75 minutes face-to-face and non-face-to-face in the care of the patient, which includes all pre, intra, and post visit time on the date of service. All documented time was specific to the E/M visit and does not include any procedures. Zada Finders, ANP   ------------------------------------------------------------------------------------------------    Service  Service Date: 09/25/2022  Nurse Practitioner: Zada Finders, PhD, MPH, ANP  Service: FAMILY MEDICINE: DIVISION     Patient  Name:Kenneth Fitzgerald  JWJX:[id number]  Age:47 y.o.  YQM:VHQI    Present Illness  Kenneth Fitzgerald is a pleasant 47 y.o. year old male, who presents today to discuss integrative and lifestyle medicine strategies that may help mitigate treatment-related side effects, as well as those that may help improve his overall health and wellness while living with metastatic melanoma.  In brief, Kenneth Fitzgerald was initially diagnosed with Stage III melanoma on his scalp in 2012 and underwent a a WLE, neck dissection and a year of high dose interferon treatments that were completed 01/2012. He had a local neck recurrence that led to another right neck dissection and subsequent radiation therapy in 2015. He was also diagnosed with thyroid cancer shortly after his initial melanoma diagnosis and underwent a thyroidectomy, as well as RAI treatment for a recurrence in 2015. In 2020 he was treated with Nivolumab that was complicated by colitis. Previously treated with dabrafenib/trametinib and then [name]/Braftovi in 2020, which he is currently receiving. For brain mets, he received WBRT in 2022 and early this year he received radiation to 3 progressive brain mets, which was completed 05/07/22. At this time his disease appears to be stable.    Additional medical history includes CAD, hyperlipidemia, iron deficiency anemia (now corrected), vitamin D deficiency, hypocalcemia, diverticulitis, Crohn's disease, gout, lumbosacral spondylosis, depression/anxiety, hypothyroidism, hearing loss, cognitive impairment following radiation therapy to the brain, and hernia repair.    Psychosocial history:  Kenneth Fitzgerald is single and lives with his two large breed dogs (a lab and a labradoodle). He is working about 20-25 hours per week at an [employer], which he enjoys for the most part. Feels his stress decreased significantly after leaving his prior job, which required long hours - up to 70 hours/week. He was a Programmer, applications, as well as a Sports coach. He is very involved in his church and plays the bass guitar for services about every 3rd week. He is also on two church committees - the medical team and the IT team. Enjoys projects around the house and building things. Feels he has good support from church community. Has helpful ongoing emotional support from Dr. Electa Sniff as well.     Oncology History  Hematology/Oncology History   Primary melanoma of head and neck (CMS-HCC)   03/17/2013 Initial Diagnosis    Melanoma (CMS-HCC)  02/12/2018 - 03/12/2018 Chemotherapy    OP NIVOLUMAB 480 MG Q4W  nivolumab 480 mg every 28 days     05/22/2021 -  Cancer Staged    Staging form: Melanoma of the Skin, AJCC 7th Edition  - Clinical: Stage IV (M1c) - Signed by [name], MD on 05/22/2021       Metastasis to brain (CMS-HCC)   12/22/2019 Initial Diagnosis    Malignant neoplasm of brain, unspecified location (CMS-HCC)     12/22/2019 - 03/02/2022 Radiation    Radiation Therapy Treatment Details (12/22/2019 - 03/02/2022)  Site: Brain  Technique: IMRT  Goal: No goal specified  Planned Treatment Start Date: No planned start date specified     04/29/2022 -  Radiation    Radiation Therapy Treatment Details (Noted on 04/29/2022)  Site: Not Applicable Brain  Technique: SRS  Goal: No goal specified  Planned Treatment Start Date: No planned start date specified         Past Medical history  Past Medical History:   Diagnosis Date    Ankylosing spondylitis (CMS-HCC)     Atherosclerotic cardiovascular disease 11/08/2021    Cancer (CMS-HCC)     melanoma, thyroid cancer    Cognitive impairment     since brain radiation he has noted some    Crohn's disease (CMS-HCC)     Depressive disorder 04/13/2015    Disease of thyroid gland     hypothyroid    Gout     Hearing impairment     hearing loss in both ears    HL (hearing loss)     HLD (hyperlipidemia) 11/08/2021    Hypothyroidism     Iron deficiency anemia 11/06/2021    Skin cancer        Past Surgical History:   Procedure Laterality Date    PR COLONOSCOPY FLX DX W/COLLJ SPEC WHEN PFRMD N/A 08/29/2021    Procedure: COLONOSCOPY, FLEXIBLE, PROXIMAL TO SPLENIC FLEXURE; DIAGNOSTIC, W/WO COLLECTION SPECIMEN BY BRUSH OR WASH;  Surgeon: Luanne Bras, MD;  [facility];  Service: Gastroenterology    PR COLONOSCOPY W/BIOPSY SINGLE/MULTIPLE N/A 01/09/2018    Procedure: COLONOSCOPY, FLEXIBLE, PROXIMAL TO SPLENIC FLEXURE; WITH BIOPSY, SINGLE OR MULTIPLE;  Surgeon: Vonda Antigua, MD;  [facility]: [facility];  Service: Gastroenterology    PR COLONOSCOPY W/BIOPSY SINGLE/MULTIPLE N/A 08/24/2019    Procedure: COLONOSCOPY, FLEXIBLE, PROXIMAL TO SPLENIC FLEXURE; WITH BIOPSY, SINGLE OR MULTIPLE;  Surgeon: Leland Her, MD;  Location: [facility]EDURES [facility];  Service: Gastroenterology    PR COLONOSCOPY W/BIOPSY SINGLE/MULTIPLE N/A 08/29/2021    Procedure: COLONOSCOPY, FLEXIBLE, PROXIMAL TO SPLENIC FLEXURE; WITH BIOPSY, SINGLE OR MULTIPLE;  Surgeon: Luanne Bras, MD;  Location: [facility];  Service: Gastroenterology    PR COLSC FLX WITH DIRECTED SUBMUCOSAL NJX ANY SBST N/A 08/24/2019    Procedure: COLONOSCOPY, FLEXIBLE, PROXIMAL TO SPLENIC FLEXURE; WITH DIRECTED SUBMUCOSAL INJECTION(S), ANY SUBSTANCE;  Surgeon: Leland Her, MD;  [facility]: [facility];  Service: Gastroenterology    PR EXPLORATORY OF ABDOMEN N/A 01/24/2020 Procedure: EXPLORATORY LAPAROTOMY, EXPLORATORY CELIOTOMY WITH OR WITHOUT BIOPSY(S);  Surgeon: [name], MD;  Location: MAIN OR [place];  Service: Trauma    PR FREEING BOWEL ADHESION,ENTEROLYSIS N/A 01/24/2020    Procedure: ENTEROLYSIS (SEPART PROC);  Surgeon: [name], MD;  Location: MAIN OR [facility];  Service: Trauma    PR IMPLANT MESH HERNIA REPAIR/DEBRIDEMENT CLOSURE N/A 01/03/2015    Procedure: IMPLANTATION OF MESH/OTHER  PROSTHES INCISION/VENTRAL HERNIA REPAIR/MESH CLOSE DEBRID NECROT SOFT TIS INFECT;  Surgeon: Romero Belling, MD;  Location: MAIN OR [place];  Service: Gastrointestinal    PR LAP, VENTRAL HERNIA REPAIR,REDUCIBLE N/A 04/07/2014    Procedure: LAPAROSCOPY, SURGICAL, REPAIR, VENTRAL, UMBILICAL, SPIGELIAN OR EPIGASTRIC HERNIA, REDUCIBLE;  Surgeon: Romero Belling, MD;  Location: MAIN OR [place];  Service: Gastrointestinal    PR NEGATIVE PRESSURE WOUND THERAPY DME >50 SQ CM N/A 01/24/2020    Procedure: NEG PRESS WOUND TX (VAC ASSIST) INCL TOPICALS, PER SESSION, TSA GREATER THAN/= 50 CM SQUARED;  Surgeon: [name], MD;  Location: MAIN OR [place];  Service: Trauma    PR REMOVAL NODES, NECK,CERV CMPLT Left 12/07/2012    Procedure: CERVICAL LYMPHADENECTOMY (COMPLETE);  Surgeon: Charlott Rakes, MD;  Location: MAIN OR [facility];  Service: Surgical Oncology    PR REMOVAL NODES, NECK,CERV MOD RAD Right 03/29/2013    Procedure: CERVICAL LYMPHADENECTOMY (MODIFIED RADICAL NECK DISSECTION);  Surgeon: Charlott Rakes, MD;  Location: MAIN OR [facility];  Service: Surgical Oncology    PR REPAIR RECURR INCIS [facility] N/A 01/03/2015    Procedure: REPAIR RECURRENT INCISIONAL OR VENTRAL HERNIA; REDUCIBLE;  Surgeon: Romero Belling, MD;  Location: MAIN OR [facility];  Service: Gastrointestinal    PR REPAIR RECURR INCIS HERNIA,STRANG N/A 06/21/2016    Procedure: REPAIR RECURRENT INCISIONAL OR VENTRAL HERNIA; INCARCERATED OR STRANGULATED;  Surgeon: Mickle Asper, MD;  Location: MAIN OR [facility];  Service: Gastrointestinal PR REPAIR RECURR INCIS HERNIA,STRANG N/A 01/24/2020    Procedure: REPAIR RECURRENT INCISIONAL OR VENTRAL HERNIA; INCARCERATED OR STRANGULATED;  Surgeon: [name], MD;  Location: MAIN OR ;  Service: Trauma    PR SIGMOIDOSCOPY,FINE NEEDL BX,US GUIDED N/A 01/09/2018    Procedure: SIGMOIDOSCOPY, FLEXIBLE, W/TRANSENDOSCOPIC ULTRASOUND GUIDED NEEDLE ASPIRATION;  Surgeon: Vonda Antigua, MD;  [facility]: [facility];  Service: Gastroenterology       Social/Family History  Social History     Socioeconomic History    Marital status: Single     Spouse name: None    Number of children: None    Years of education: None    Highest education level: None   Tobacco Use    Smoking status: Never    Smokeless tobacco: Never   Vaping Use    Vaping status: Never Used   Substance and Sexual Activity    Alcohol use: Yes     Comment: social drinker 3 times yearly    Drug use: No     Social Determinants of Health     Financial Resource Strain: Low Risk  (11/05/2021)    Overall Financial Resource Strain (CARDIA)     Difficulty of Paying Living Expenses: Not very hard   Food Insecurity: No Food Insecurity (11/05/2021)    Hunger Vital Sign     Worried About Running Out of Food in the Last Year: Never true     Ran Out of Food in the Last Year: Never true   Transportation Needs: No Transportation Needs (11/05/2021)    PRAPARE - Therapist, art (Medical): No     Lack of Transportation (Non-Medical): No   Physical Activity: Inactive (11/05/2021)    Exercise Vital Sign     Days of Exercise per Week: 0 days     Minutes of Exercise per Session: 0 min   Stress: Stress Concern Present (11/05/2021)    [employer] of Occupational Health - Occupational Stress Questionnaire     Feeling of Stress : To  some extent   Social Connections: Moderately Isolated (11/05/2021)    Social Connection and Isolation Panel [NHANES]     Frequency of Communication with Friends and Family: More than three times a week     Frequency of Social Gatherings with Friends and Family: Once a week     Attends Religious Services: More than 4 times per year     Active Member of [employer] or Organizations: No     Attends Engineer, structural: Never     Marital Status: Never married       Family History   Problem Relation Age of Onset    Hyperthyroidism Mother     Osteoporosis Mother     Arrhythmia Mother     Squamous cell carcinoma Mother         basal cell vs squamous cell skin cancer    Coronary artery disease Father         s/p CABG    Diabetes Father     Hypertension Father     Prostate cancer Father     Colon cancer Paternal Grandmother     Thyroid disease Neg Hx        Allergies    Allergies   Allergen Reactions    Compazine [Prochlorperazine] Itching    Coconut Nausea And Vomiting       Body Mass Index  Noted as 40.55 kg/m2 from last clinic visit.    Substance Use  Social History     Substance and Sexual Activity   Alcohol Use Yes    Comment: social drinker 3 times yearly      reports current alcohol use.  Social History     Tobacco Use   Smoking Status Never   Smokeless Tobacco Never      reports that he has never smoked. He has never used smokeless tobacco.  Social History     Substance and Sexual Activity   Drug Use No       Pertinent Medications    Current Outpatient Medications:     acetaminophen (TYLENOL) 500 MG tablet, Take 2 tablets (1,000 mg total) by mouth every eight (8) hours as needed for pain., Disp: 30 tablet, Rfl: 0    binimetinib ([name]) 15 mg tablet, Take 3 tablets (45 mg total) by mouth two (2) times a day., Disp: 180 tablet, Rfl: 11    blood sugar diagnostic Strp, Dispense 100 blood glucose test strips, ok to sub any brand preferred by insurance/patient, use 3x/day; dispense whatever brand matches with meter., Disp: 100 strip, Rfl: 12    blood-glucose meter kit, Use as instructed; dispense 1 meter, whatever is preferred by insurance, Disp: 1 each, Rfl: 1    calcitriol (ROCALTROL) 0.25 MCG capsule, Take 1 capsule (0.25 mcg total) by mouth daily., Disp: 90 capsule, Rfl: 3    calcium carbonate 650 mg calcium (1,625 mg) tablet, Take 1 tablet (650 mg elem calcium total) by mouth Three (3) times a day with a meal., Disp: , Rfl:     DULoxetine (CYMBALTA) 30 MG capsule, Take 1 capsule (30 mg total) by mouth two (2) times a day. (Patient taking differently: Take 1 capsule (30 mg total) by mouth daily.), Disp: 180 capsule, Rfl: 3    encorafenib (BRAFTOVI) 75 mg capsule, Take 6 capsules (450 mg total) by mouth daily., Disp: 180 capsule, Rfl: 11    ezetimibe (ZETIA) 10 mg tablet, Take 1 tablet (10 mg total) by mouth daily., Disp: 90 tablet, Rfl: 3  fexofenadine (ALLEGRA ALLERGY) 180 MG tablet, Take 1 tablet (180 mg total) by mouth daily as needed (allergies)., Disp: , Rfl:     fluticasone propionate (FLONASE) 50 mcg/actuation nasal spray, 1 spray into each nostril daily., Disp: , Rfl:     folic acid (FOLVITE) 1 MG tablet, TAKE 1 TABLET BY MOUTH EVERY DAY, Disp: 90 tablet, Rfl: 2    guaiFENesin (MUCINEX) 600 mg 12 hr tablet, Take 1 tablet (600 mg total) by mouth every twelve (12) hours., Disp: , Rfl:     ibuprofen (ADVIL,MOTRIN) 200 MG tablet, Take 3 tablets (600 mg total) by mouth daily as needed for pain., Disp: , Rfl:     lancets Misc, Dispense 100 lancets, ok to sub any brand preferred by insurance/patient, use 3x/day, Disp: 100 each, Rfl: 12    levothyroxine (SYNTHROID) 125 MCG tablet, Take 375 mcg daily; please dispense 90-day supply, Disp: 270 tablet, Rfl: 3    memantine (NAMENDA) 10 MG tablet, Take 1 tablet (10 mg total) by mouth Two (2) times a day., Disp: 60 tablet, Rfl: 6    ofloxacin (OCUFLOX) 0.3 % ophthalmic solution, Instill 4 drops in left ear twice daily for 5 days, Disp: 5 mL, Rfl: 3    ondansetron (ZOFRAN) 8 MG tablet, Take 1 tablet (8 mg total) by mouth Take as directed., Disp: , Rfl:     Review of Systems  12 pt ROS negative except as noted in HPI.    Physical Examination  General - AAOx3, NAD. Well dressed.  Psychiatry - appropriate affect and mood. ST and LT memory intact, intact executive fn.    Zada Finders, PhD, MPH, ANP-BC    Thank you for the referral and the opportunity to be involved in the care of your patient.

## 2022-09-25 NOTE — Unmapped (Signed)
[facility] Refill Coordination Note    Specialty Medication(s) to be Shipped:   Hematology/Oncology: Braftovi 75mg  and [name] 15mg     Other medication(s) to be shipped: No additional medications requested for fill at this time     Kenneth Fitzgerald, DOB: [birth year]  Phone: [phone] (home)       All above HIPAA information was verified with patient.     Was a Nurse, learning disability used for this call? No    Completed refill call assessment today to schedule patient's medication shipment from the [facility] [phone]).  All relevant notes have been reviewed.     Specialty medication(s) and dose(s) confirmed: Regimen is correct and unchanged.   Changes to medications: Benino reports no changes at this time.  Changes to insurance: No  New side effects reported not previously addressed with a pharmacist or physician: None reported  Questions for the pharmacist: No    Confirmed patient received a Conservation officer, historic buildings and a Surveyor, mining with first shipment. The patient will receive a drug information handout for each medication shipped and additional FDA Medication Guides as required.       DISEASE/MEDICATION-SPECIFIC INFORMATION        N/A    SPECIALTY MEDICATION ADHERENCE     Medication Adherence    Patient reported X missed doses in the last month: 0  Specialty Medication: BRAFTOVI 75 mg capsule (encorafenib)  Patient is on additional specialty medications: Yes  Additional Specialty Medications: [name] 15 mg tablet (binimetinib)  Patient Reported Additional Medication X Missed Doses in the Last Month: 0  Patient is on more than two specialty medications: No  Informant: patient              Were doses missed due to medication being on hold? No    Braftovi 75 mg: 12 days of medicine on hand   [name] 15 mg: 12 days of medicine on hand       REFERRAL TO PHARMACIST     Referral to the pharmacist: Not needed      [facility]     Shipping address confirmed in Epic.       Delivery Scheduled: Yes, Expected medication delivery date: 10/03/22.     Medication will be delivered via UPS to the prescription address in Epic WAM.    [name]   [facility] Specialty Technician

## 2022-09-26 ENCOUNTER — Telehealth: Admit: 2022-09-26 | Discharge: 2022-09-27 | Payer: PRIVATE HEALTH INSURANCE | Attending: Clinical | Primary: Clinical

## 2022-09-26 NOTE — Unmapped (Signed)
[facility]  Psychiatry  Psychotherapy Note - Telehealth via Video     Service Date: September 26, 2022  Service: 50 minutes of psychotherapy via video session  Time with Patient: 50 minutes     Encounter Description: This encounter was conducted from provider's office via EPIC video session due to COVID-19 pandemic. Kenneth Fitzgerald was located in his home. Session was conducted via EPIC.  Rationale for video session is need to social distance. See Plan for telemedicine consent/disclaimer.      Encounter Description/Consent:   Kenneth Fitzgerald visit was completed through telehealth encounter (video).      This patient encounter is appropriate and reasonable under the circumstances. The patient has been advised of the potential risks and limitations of this mode of treatment (including, but not limited to, the absence of in-person examination) and has agreed to be treated in a remote fashion in spite of them. Any and all of the patient's/patient's family's questions on this issue have been answered.      The patient was physically located in [place] in which I am permitted to provide care. The patient understood that he may incur co-pays and cost sharing, and agreed to the telemedicine visit. The visit was reasonable and appropriate under the circumstances given the patient's presentation at the time.      Time Spent: 48 minutes     Assessment:  Kenneth Fitzgerald is a 47yo male with a history of metastatic melanoma and thyroid cancer who I see for psychotherapy to address depression and adjustment to multiple stressors.  We met this afternoon via telehealth.  Kenneth Fitzgerald reported relatively good psychosocial functioning and denied emergent psych concerns.  He remains bouyed by recent scans and is managing longstanding intrafamilial issues.  I offered support, psychoeducation, and recommendations for addressing/tolerating distress and enhancing adaptive coping.  Will continue to follow and scheduled to meet next in ~ four weeks.     Risk Assessment:  A suicide and violence risk assessment was performed as part of this evaluation. There patient is deemed to be at chronic elevated risk for self-harm/suicide given the following factors: current diagnosis of depression, suicidal ideation or threats without a plan and past diagnosis of depression. There patient is deemed to be at chronic elevated risk for violence given the following factors: male gender. These risk factors are mitigated by the following factors: lack of active SI/HI. There is no acute risk for suicide or violence at this time. The patient was educated about relevant modifiable risk factors including following recommendations for treatment of psychiatric illness and abstaining from substance abuse. While future psychiatric events cannot be accurately predicted, the patient does not currently require acute inpatient psychiatric care and does not currently meet [facility] involuntary commitment criteria.              Plan:  1. Will continue to see Mr. Koppel for psychotherapy, scheduled to meet next on 9/30.  2. Mr. Schultheis has re-established care with [name], NP.  3. Mr. Cowens has information on available CCSP resources and he has worked with [name], [name].  I have previously reached out to [name] to explore financial resources.  4. Mr. Soisson knows to reach me as needed.     Subjective:   Kenneth Fitzgerald presented as alert, oriented, and engaged.     Kenneth Fitzgerald endorsed more frequently having times where he becomes emotional and teary.  He described that things that wouldn't normally make me cry such  as watching commercials are triggering these brief episodes, which occur more days that not.  In exploring possible reasons, Kenneth Fitzgerald described experiencing more intense and frequent episodes wit his sinuses and joints (it's frustrating), thinking more often about his experiences when in active cancer treatment, and an issue with his church band that leaves it unclear if he will be playing as a part of this moving forward.  Kenneth Fitzgerald also shared more about his childhood and sense that he always strived to win his father's approval. No emergent psych concerns noted.    Kenneth Fitzgerald has re-established care with [name], NP and is taking Cymbalta.  He also continues to take Synthroid.  He met with Zada Finders of [facility] earlier this week.     I provided Kenneth Fitzgerald support, understanding, praise, and psychoeducation.  We discussed and processed his thoughts, feelings, and experiences in a therapeutic environment.  We reviewed and problem-solved approaches to address and tolerate distress, and to promote continued adaptation.  Mr. Rosten was engaged and receptive.     We scheduled to meet next on 9/30.     Mental Status Exam:  Appearance:     Appears stated age   Motor:    No abnormal movements   Speech/Language:     Normal rate, volume, tone, fluency   Mood:   eh   Affect:    Calm; cooperative   Thought process:    Logical, linear, clear, coherent, goal-directed   Thought content:      Denies SI/HI or thought of self-harm   Perceptual disturbances:      Behavior not concerning for response to internal stimuli      Orientation:    Oriented to person, place, time, and general circumstances   Attention:    Able to fully attend without fluctuations in consciousness   Concentration:    Able to fully concentrate and attend   Memory:    Immediate, short-term, long-term, and recall grossly intact    Fund of knowledge:     Consistent with level of education and development   Insight:      Intact   Judgment:     Intact   Impulse Control:    Intact      Diagnosis: Recurrent major depressive disorder, moderate;  Anxiety associated with cancer diagnosis     [name]. Eloy Fehl PhD  September 29, 2022

## 2022-09-26 NOTE — Unmapped (Addendum)
It was such a pleasure meeting you Kenneth Fitzgerald and I hope some of the information was helpful. Please see below for a summary of recommendations based on our discussion, as well as some additional resources.    To help you in meeting your wieght loss goal, it may be very helpful for you to create  two or three SMART goals (Specific, Measurable, Achievable, Realistic and Time-oriented). For example:    I will increase my fruit & vegetable intake by adding one more fruit and one more vegetable a day over the next month.    I will walk for 15 minutes at least 5 days a week for the next 4 weeks.    1) Cancer-related Fatigue:  Recommendations with clinical evidence for Cancer-Related Fatigue (CRF) during treatment and/or after treatments (ordered by level of clinical efficacy with the highest to lowest)    a. Physical Activity - both aerobic and strength training has the highest level of evidence for CRF than any other strategy. During treatment aim for at least 30 minutes three times/week or 45 minutes twice weekly of aerobic exercise and strength training 1-2 days/week.     b. Acupuncture - Once a week for 6 or more weeks.    Here are a few acupuncturists in the [place] area:    [facility], [place] and [place] locations  [email or web address]    [facility], [place], [place]  Dr. Raynelle Chary, [place] (private practice)  [phone]  [email or web address]    [facility] in [facility]:  [facility]  [phone]    About - [facility] - [phone] ([email or web address])     c. Cognitive Behavioral Therapy and Hypnosis (with a qualified provider) - Once per week for 4 or more weeks or immediately before chemo and radiation treatments throughout care or 1-3x daily for self-hypnosis.    d. Gentle Yoga - One to two 60 to 90-minute classes per week for 12 weeks and home-based daily practice. If interested, we have a free gentle yoga program offered mostly online.     2) Joint Pains:  Improving nutrition and more physical movement may help.  Also, recommend acupuncture, which has evidence for reducing musculoskeletal pain.    3) Safe weight loss   Discussed the role of both a healthy diet and exercise for weight loss. See below for nutrition and physical activity information that can help with healthy weight loss.    4) Nutrition (Food as Medicine):  Suggest moving towards an Anti-inflammatory/Mediterranean-style Diet with more fruits & vegetables, especially berries and cruciferous vegetables (at least 5-6/day, but preferably up to 9 servings/day of fruits & veggies), cooked culinary mushrooms, increased fish intake (especially fish high in omega-3-fatty acids such as salmon, mackerel, halibut, cod, sardines etc.), decrease red meat intake to no more than twice weekly, incorporate nuts, seeds (especially ground flax seed), beans, whole soy foods (tofu, tempeh, edamame, etc.) and whole grains (e.g., brown rice instead of white rice, whole grain pastas, quinoa, etc).  Add either extra virgin olive oil or avocado oil in salads, vegetables, etc. daily as healthy sources of dietary fats.  Add herbs and spices to meals, such as turmeric, ginger, cinnamon, mint, thyme, rosemary, oregano, garlic, onions, etc. as many have been found to have anti-inflammatory and/or anti-cancer effects.  Portion control is a good way to decrease daily caloric intake, especially with nuts even though they are a good source of healthy fats, protein and some essential vitamins and minerals. Limit or  avoid processed foods and sugary beverages. Aim to drink approximately 6-8 eight ounces of water intake daily. With regard to dietary fiber intake, suggest aiming to eat at least 20 grams/day and ideally 30+ grams daily if you have no medical conditions that would prevent an increase in fiber.    Consider swapping out diet sodas for either water with lemon/lime or unsweetened green tea. If you like the carbonation consider seltzer water without added sugars. There is some evidence that diet sodas can lead to weight gain and other health issues.     Flax seed, 1-2 tablespoons, freshly ground whole seed daily. Can use a coffee grinder.  Keep refrigerated for longer shelf-life. Can add to morning cereal, daily soup/salad or mix into yogurt. Flax is a good source of fiber and Omega-3 fatty acids.    Recommend increasing foods that are good sources of fiber/prebiotics and probiotics for improved gut health:    Prebiotics:  Apples, bananas, barley, garlic, onion, leeks, chicory root, dandelion greens, sunchokes/Jerusalem artichokes, flax seeds, jicama, and oats.    Probiotics:  Yogurt, miso, tempeh, kefir, pickled vegetables, sauerkraut, kimchi and buttermilk.    7-day meal plan for a plant-based diet:  Eating-on-Budget.pdf (lifestylemedicine.org)    For more information about an Anti-inflammatory Lifestyle, please see the following handout in the link below (copy and paste into your browser):    https://www.fammed.PromotionalReview.nl.pdf    Cancer Survivor Nutrition Site from [facility] - tips for dealing with treatment-related side effects and healthy recipes, including healthy smoothies:    MyBloggers.si    Another reputable source for healthy recipes is the [employer] for [employer] (AICR):    DictionaryDirectory.es    Recommend the cookbook, Programme researcher, broadcasting/film/video by Alysia Penna.    Alcohol intake - the American Cancer Society (ACS) recommendation is to limit alcohol intake to no more than 2 alcoholic drinks/day for men and no more than one drink/day for women. Alcoholic drink defined as follows: 12 ounces of beer, 5 ounces of wine or 1.5 ounces of spirits. However, considering alcohol is a known carcinogen you may wish to limit for special occasions.    5) Physical Activity:  Exercise counseling and intervention offered included: benefits of routine exercise on overall health and as it relates to cancer.  There are a myriad of benefits from regular exercise, including improving energy, as well as the relationship between exercise and decreased mortality for some cancers  and all-cause mortality. General recommendations for cancer patients/survivors are to aim for at least 150 minutes of moderate aerobic activity a week and strength training of all major muscle groups at least twice/week, as well as regular stretching for improved flexibility.     If available close to home and practical for your work schedule, you could consider the free [employer] exercise program for cancer survivors. Check with your closed [employer] on availability.    6) History of depression:  To continue with ongoing support from Dr. Electa Sniff, as well as emotional health support from Dr. [name] with the Comprehensive Cancer Support Program.    7) Other/Supplements:  Consider the use of a quality daily men's multivitamin if unable to obtain sufficient nutrients through a predominantly plant-based, whole foods diet. If started, you may not need the additional folic acid supplement. When choosing/taking a multivitamin here are a few things to consider:    Avoid coloring and added fillers  Avoid added iron, unless you have iron-deficiency anemia and this was recommended by your oncology  provider.  Look for 100% of the DV for Vitamins B1 (thiamin), B2 (riboflavin), niacin, Vitamins B6, B12, A, C, D, E and folic acid.   Look for the USP or NSF label, which are third party testing agencies that help assure a quality product.  Look for methylated B vitamins, which are forms of B12, B6 and folate (B9) that are typically better absorbed.  Look for chelated minerals if possible, as they may be better absorbed than those non-chelated.  Avoid megadoses, especially of fat-soluble vitamins like Vitamins A, D, and E.  Take with food for best absorption.    Recommend that you speak with your endocrinologist about the possibility of increasing Vitamin D dose as most recent level was in the low normal range.    I would not recommend Ivermectin at this time as there is very limited data on its role for melanoma. While I recognize that Ivermectin has demonstrated some anti-cancer activity, the research data is limited to in vitro melanoma cell lines and a mouse model, which do not necessarily translate to human beings. Also, it is unknown as to how it may interact with your current treatments.    8) Recommend the book entitled, Anticancer Living: Transform Your Life and Health with the Mix of Six by Boyce Medici, PhD and Paul Half, published 2018.    Please let me know if you have additional questions or would like to schedule a follow-up appointment.     Wishing you improved health moving forward,  Mylo Red, PhD, MPH, ANP, [facility]  Oncology Nurse Practitioner and Integrative Medicine Clinician  Comprehensive Cancer Support Program (CCSP)  [email or web address]

## 2022-09-30 MED ORDER — SULFASALAZINE 500 MG TABLET
ORAL_TABLET | Freq: Two times a day (BID) | ORAL | 3 refills | 0 days
Start: 2022-09-30 — End: ?

## 2022-09-30 MED ORDER — FOLIC ACID 1 MG TABLET
ORAL_TABLET | 2 refills | 0 days
Start: 2022-09-30 — End: ?

## 2022-10-01 MED ORDER — FOLIC ACID 1 MG TABLET
ORAL_TABLET | 0 refills | 0 days | Status: CP
Start: 2022-10-01 — End: ?

## 2022-10-01 MED ORDER — SULFASALAZINE 500 MG TABLET
ORAL_TABLET | Freq: Two times a day (BID) | ORAL | 0 refills | 90 days | Status: CP
Start: 2022-10-01 — End: 2023-09-26

## 2022-10-01 NOTE — Unmapped (Signed)
Encounter for refill request:  HANSEN VISIT PLANNED FOR OCT 2024      Lab Results   Component Value Date    WBC 6.6 07/17/2022    RBC 4.88 07/17/2022    HGB 14.3 07/17/2022    HCT 42.8 07/17/2022    PLT 223 07/17/2022    ALT 38 07/17/2022    AST 39 (H) 07/17/2022    ALKPHOS 86 07/17/2022    CRP 14.0 (H) 09/30/2019    CREATININE 1.07 07/17/2022       SULFASALZINE/FOLIC ACID refills authorized x  

## 2022-10-02 MED FILL — BRAFTOVI 75 MG CAPSULE: ORAL | 30 days supply | Qty: 180 | Fill #11

## 2022-10-02 MED FILL — MEKTOVI 15 MG TABLET: ORAL | 30 days supply | Qty: 180 | Fill #11

## 2022-10-05 NOTE — Unmapped (Unsigned)
Assessment/Plan:    1. T1N1b papillary thyroid cancer (classic/follicular variant), multifocal, s/p 3 surgeries and 2 doses of I-131. Last treatment was in 2015. Has indeterminate response to treatment based on persistent detectable Tg (< 1 on suppression, rose to 2.4 with stimulation in Jan 2018).  He also has stage IV melanoma, followed by oncology.  -Thyrogen-stimulated WBS was negative in 01/2016; stimulated Tg was 2.4  -Tg on suppression has been stable, with fluctuations commensurate with fluctuations in TSH, i.e.  Tg was 0.3 in 08/2019 (with TSH of 1.7) and was 0.6 in 09/2021 (with TSH of 9.045). Tg Ab has been negative. Repeat Tg/Ab today.  -Last [place] 04/03/22 stable, repeat US ~ 03/2024 if Tg/Ab stable and clinically appropriate    2. Hypothyroidism, euthyroid on several checks while taking current dose of levothyroxine 375 mcg daily  -TFTs in normal range 10/07/2022  -**Following both TSH and FT4 re: brain XRT x 10 days from 12/2019-01/2020, and again in 04/2022 --> Free T4 10/07/2022 on the lower end of normal range, with low normal TSH, so may need to LT4 adjust dose upwards based on future lab trend and clinical status  -Reviewed appropriate administration, and factors that can affect dose needs or laboratory measurement    3. Hypocalcemia, severe, started calcitriol 2017. Low calcium in 2019 and in 2020 when off of calcitriol.  Thereafter, levels have generally been at or close to goal when he is taking calcitriol regularly and getting enough dietary or supplemental calcium.  -Calcium normal on multiple recent lab checks, no symptoms at present  -Continue calcitriol 0.25 mcg/day, and calcium 1200-1500 mg/day through combination of diet and supplements, difficult for him to take supplement regularly  -Reviewed goals of treatment and symptoms or signs with which to take extra calcium and/or notify us.     Return in about 6 months (around 04/06/2023).     Orders Placed This Encounter   Procedures    TSH    T4, Free Thyroglobulin Tumor Marker, Serum    Calcium    Phosphorus Level    Albumin             Reason For Visit:   Chief Complaint   Patient presents with    Follow-up thyroid cancer and hypoparathyroidism      Subjective:     History of Present Illness:  Kenneth Fitzgerald is a 47 y.o. male with PTC who was last seen 04/2020.    04/03/22 - visit, TFTs stable on levothyroxine 375 mcg daily, Korea stable, RTC 6 mo  04/2022 - another round of XRT (cyberknife)  06/03/2022 - PCP asked about starting Zetia, as patient was concerned about interaction with thyroid disease    Taking LT4 375 mcg daily. Good adherence.  He takes in the morning on an empty stomach without interfering medications or supplements. No overt hypothyroid or hyperthyroid symptoms. No neck pain, swelling, globus, dysphagia, or dysphonia.    Taking calcitriol 0.25 mcg/day.  Has several servings of calcium in the diet.  Also taking prn supplemental calcium and has very low threshold to take extra in case of any symptoms. No kidney stones. No recent hypocalcemia symptoms but does keep extra calcium with him at all times.     Weight trend:  Wt Readings from Last 6 Encounters:   10/07/22 (!) (P) 143.3 kg (316 lb)   08/30/22 (!) 143.2 kg (315 lb 12.8 oz)   07/17/22 (!) 142 kg (313 lb 1.6 oz)   07/17/22 (!) 141.5 kg (  311 lb 14.4 oz)   05/30/22 (!) 141.6 kg (312 lb 3.2 oz)   05/07/22 (!) 139.1 kg (306 lb 11.2 oz)       PMH:  1. 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 Thyroidectomy 06/25/2011 with 1 foci each lobe and 2/2 central LN (left Level 6)     1b. Thyrogen-based RAI 125 mCi 03/2012 with pre/post scans showing fairly large focus of bed activity and 4.2% uptake; pretx stimulated Tg of 73     1c. Recurrence on [place] 09/2012 in left thyroid bed and confirmed by FNA 10/2012; left ND in 11/2012 levels 3-6 with 12/16 positive (largest 1.8 cm, and some with extranodal extension)     1d. Right CND 03/2013 levels 2-4 with 3/20 positive (largest 3.5 mm), found incidentally as the right ND was performed for melanoma recurrence which was 1 cm node in 1 of 20 nodes     1e. Thyrogen-based 150 mCi in 05/2013 [LID as well before the treatment]      65f. Thyrogen-stimulated Tg was 2.4 in 01/2016. WBS in 01/2016 was negative.  2. Postsurgical hypothyroidism  3. Melanoma - scalp lesion, surgery including RND, and systemic therapy ending 01/2012; recurrence in a 1 cm right LN removed 03/2013 on repeat RND; XRT completed 06/2013; whole brain XRT for brain metastases 12/2019-01/2020; XRT to brain again 04/2022  4. Diverticulitis  5. H/o anklyosing spondylitis 2009  6.  Small bowel obstruction, hospitalization 11/2018, due to adhesions from past abdominal surgeries including hernia repairs  7. Hyperglycemia, new in 03/2018, in the setting of 4 weeks of prednisone to treat a reaction to immunotherapy.  A1c 6.0% in 06/2018. Took MTF short time. A1c improved in 08/2019. Has been off MTF since 12/2019.  -A1c normal 04/2020. Glucose values on recent panels have been normal.   8. Crohns, diagnosed 09/2019 by Dr. Stevphen Rochester in GI, but likely present since 2019 per note, defer immunosuppressive therapy re: active melanoma, start w sulfasalazine     PSH reviewed in Epic      Current Outpatient Medications:     acetaminophen (TYLENOL) 500 MG tablet, Take 2 tablets (1,000 mg total) by mouth every eight (8) hours as needed for pain., Disp: 30 tablet, Rfl: 0    binimetinib ([name]) 15 mg tablet, Take 3 tablets (45 mg total) by mouth two (2) times a day., Disp: 180 tablet, Rfl: 11    blood sugar diagnostic Strp, Dispense 100 blood glucose test strips, ok to sub any brand preferred by insurance/patient, use 3x/day; dispense whatever brand matches with meter., Disp: 100 strip, Rfl: 12    blood-glucose meter kit, Use as instructed; dispense 1 meter, whatever is preferred by insurance, Disp: 1 each, Rfl: 1    calcitriol (ROCALTROL) 0.25 MCG capsule, Take 1 capsule (0.25 mcg total) by mouth daily., Disp: 90 capsule, Rfl: 3    calcium carbonate 650 mg calcium (1,625 mg) tablet, Take 1 tablet (650 mg elem calcium total) by mouth Three (3) times a day with a meal., Disp: , Rfl:     DULoxetine (CYMBALTA) 30 MG capsule, Take 1 capsule (30 mg total) by mouth two (2) times a day. (Patient taking differently: Take 1 capsule (30 mg total) by mouth daily.), Disp: 180 capsule, Rfl: 3    encorafenib (BRAFTOVI) 75 mg capsule, Take 6 capsules (450 mg total) by mouth daily., Disp: 180 capsule, Rfl: 11    ezetimibe (ZETIA) 10 mg tablet, Take 1 tablet (10 mg total) by  mouth daily., Disp: 90 tablet, Rfl: 3    fexofenadine (ALLEGRA ALLERGY) 180 MG tablet, Take 1 tablet (180 mg total) by mouth daily as needed (allergies)., Disp: , Rfl:     fluticasone propionate (FLONASE) 50 mcg/actuation nasal spray, 1 spray into each nostril daily., Disp: , Rfl:     folic acid (FOLVITE) 1 MG tablet, TAKE 1 TABLET BY MOUTH EVERY DAY, Disp: 90 tablet, Rfl: 0    guaiFENesin (MUCINEX) 600 mg 12 hr tablet, Take 1 tablet (600 mg total) by mouth every twelve (12) hours., Disp: , Rfl:     ibuprofen (ADVIL,MOTRIN) 200 MG tablet, Take 3 tablets (600 mg total) by mouth daily as needed for pain., Disp: , Rfl:     lancets Misc, Dispense 100 lancets, ok to sub any brand preferred by insurance/patient, use 3x/day, Disp: 100 each, Rfl: 12    levothyroxine (SYNTHROID) 125 MCG tablet, Take 375 mcg daily; please dispense 90-day supply, Disp: 270 tablet, Rfl: 3    ofloxacin (OCUFLOX) 0.3 % ophthalmic solution, Instill 4 drops in left ear twice daily for 5 days, Disp: 5 mL, Rfl: 3    ondansetron (ZOFRAN) 8 MG tablet, Take 1 tablet (8 mg total) by mouth Take as directed., Disp: , Rfl:     sulfaSALAzine (AZULFIDINE) 500 mg tablet, TAKE 4 TABLETS (2,000 MG TOTAL) BY MOUTH TWO (2) TIMES A DAY., Disp: 720 tablet, Rfl: 0    memantine (NAMENDA) 10 MG tablet, Take 1 tablet (10 mg total) by mouth Two (2) times a day., Disp: 60 tablet, Rfl: 6      Allergies   Allergen Reactions    Compazine [Prochlorperazine] Itching    Coconut Nausea And Vomiting       ROS  No chest pain or SOB. No f/c.  No cough.  No fractures or falls. Remainder of 8 systems reviewed were negative except as noted in the history of present illness.    Social Hx:  -Working at [employer] since 2022-10-24  -Nonsmoker    Family History   Problem Relation Age of Onset    Hyperthyroidism Mother     Osteoporosis Mother     Arrhythmia Mother     Squamous cell carcinoma Mother         basal cell vs squamous cell skin cancer    Coronary artery disease Father         s/p CABG    Diabetes Father     Hypertension Father     Prostate cancer Father     Colon cancer Paternal Grandmother     Thyroid disease Neg Hx    Father died 23-Oct-2017 with diabetes and heart disease  Mother died in Oct 23, 2012    Objective:     Physical Exam:  BP 130/80  - Pulse 77  - Temp 36.3 ??C (97.3 ??F)    BP Readings from Last 3 Encounters:   10/07/22 130/80   08/30/22 140/90   07/17/22 135/85     General appearance - Pleasant, conversant, NAD. Generalized and abdominal obesity.   Eyes -  No stare or lid lag, no lid edema  Neck - Supple w/o palpable LAD, no masses appreciated. Well healed incision from thyroidectomy.and previous LND.   Lymphatics - no cervical or supraclavicular adenopathy appreciated   Resp - clear to auscultation bilaterally  CV - normal rate, regular rhythm  Neurological - no hand tremors, 2+ upper extremity DTRs  Extremities - peripheral pulses normal, no lower extremity edema  Skin -  warm, dry, no acanthosis     Data Review:    TSH   Date Value   10/07/2022 0.921 uIU/mL   07/17/2022 5.233 uIU/mL (H)   04/03/2022 4.582 uIU/mL   10/04/2013 0.11 u[iU]/mL (L)   06/28/2013 0.28 u[iU]/mL (L)   04/27/2013 0.09 u[iU]/mL (L)     Free T4 (ng/dL)   Date Value   81/19/1478 0.89   07/17/2022 0.94   04/03/2022 0.81 (L)   10/04/2013 1.55 (H)   06/28/2013 1.40   04/27/2013 1.69 (H)     THYROGLOBULIN AB (IU/mL)   Date Value   10/10/2021 <1.8   08/30/2019 <1.8   06/22/2018 <1.8 Thyroglobulin, Tumor Marker, IA (ng/mL)   Date Value   10/10/2021 0.6 (H)   08/30/2019 0.3 (H)   06/22/2018 0.2 (H)      Lab Results   Component Value Date    CALCIUM 9.0 10/07/2022    CALCIUM 8.3 (L) 07/17/2022    CALCIUM 8.6 (L) 05/30/2022    PHOS 4.4 10/07/2022    PHOS 4.3 10/31/2021    PHOS 5.8 (H) 05/10/2020    CREATININE 1.07 07/17/2022    CREATININE 0.99 05/30/2022    CREATININE 1.06 04/17/2022    VITDTOTAL 23.8 08/30/2022    VITDTOTAL 17.4 (L) 01/16/2022    VITDTOTAL 31.0 02/27/2017    PTH 9.9 (L) 12/04/2015    PTH 12.4 12/12/2014    PTH 12 04/27/2013     Lab Results   Component Value Date    A1C 5.4 05/10/2020     Calcium values while hospitalized in 06/2016 were < 7, had low ionized calcium as well.     01/2016 - Thyrogen stimulated testing  - Tg rose to 2.4    PATH from 03/2013 right neck dissection:  Amended: 03/31/2013 by Kirkland Hun, MD  Reason: Additional Information  Comment: The report is amended to add the results of a BRAF V600E immunostain.  The final diagnosis is unchanged.  Previous Signout Date: 03/30/2013  Diagnosis:  Lymph nodes, right cervical, levels 2-4, removal and partial parotidectomy  -Metastatic melanoma involving 1 out of 20 lymph nodes, with the largest diameter measuring 10 mm (1.0 cm) and no evidence of extracapsular extension (1/20)  -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, with the largest diameter measuring 3.5 mm and no evidence of extracapsular extension (3/20)   -BRAF V600E immunohistochemical staining is positive (3+ staining, 100% of cells) --> this was on LN with melanoma  -Benign parotid gland is also present  *Molecular testing:  RESULTS:  Gene Variants of Known Clinical Utility:   BRAF: No mutation detected (see note) --> A BRAF V600E immunohistochemical stain was performed on the tissue section of this case and was positive (see case [id number]). Though within validated parameters, the tumor input for sequencing in this case was low. This could result in the V600E mutant allele frequency falling below the limit of detection for the assay. [Again on LN with melanoma]  KIT: No mutation detected     Radiology:    04/03/22 US Thyroid    COMPARISON: Ultrasound 01/03/2021 and priors    FINDINGS: Patient is status post total thyroidectomy. No residual thyroid tissue is visualized in the thyroidectomy bed.    No pathologically enlarged lymph nodes were identified throughout the neck.    Impression  1. Status post total thyroidectomy without sonographic evidence of residual thyroid tissue or recurrent disease with the thyroid bed.  2. No pathologically enlarged lymph nodes  identified within the neck.    01/2016 Whole body scan:  FINDINGS:  There are no abnormal foci of increased radiotracer uptake identified.  There is physiologic uptake seen in the nasopharynx, the bowel, stomach, and bladder.  Impression  No evidence of I-131 avid metastasis.    05/2013 had stimulated PET and WBS, as well as 1 week post treatment WBS metastasis.    05/2013 had stimulated PET and WBS, as well as 1 week post treatment WBS

## 2022-10-07 ENCOUNTER — Ambulatory Visit: Admit: 2022-10-07 | Discharge: 2022-10-08 | Payer: PRIVATE HEALTH INSURANCE

## 2022-10-07 DIAGNOSIS — E890 Postprocedural hypothyroidism: Principal | ICD-10-CM

## 2022-10-07 DIAGNOSIS — C73 Malignant neoplasm of thyroid gland: Principal | ICD-10-CM

## 2022-10-07 LAB — TSH: THYROID STIMULATING HORMONE: 0.921 u[IU]/mL (ref 0.550–4.780)

## 2022-10-07 LAB — ALBUMIN: ALBUMIN: 3.8 g/dL (ref 3.4–5.0)

## 2022-10-07 LAB — T4, FREE: FREE T4: 0.89 ng/dL (ref 0.89–1.76)

## 2022-10-07 LAB — CALCIUM: CALCIUM: 9 mg/dL (ref 8.7–10.4)

## 2022-10-07 LAB — PHOSPHORUS: PHOSPHORUS: 4.4 mg/dL (ref 2.4–5.1)

## 2022-10-14 ENCOUNTER — Telehealth: Admit: 2022-10-14 | Discharge: 2022-10-15 | Payer: PRIVATE HEALTH INSURANCE | Attending: Clinical | Primary: Clinical
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
## 2022-10-24 DIAGNOSIS — C439 Malignant melanoma of skin, unspecified: Principal | ICD-10-CM

## 2022-10-24 MED ORDER — BRAFTOVI 75 MG CAPSULE
ORAL_CAPSULE | Freq: Every day | ORAL | 11 refills | 30 days
Start: 2022-10-24 — End: ?

## 2022-10-24 MED ORDER — MEKTOVI 15 MG TABLET
ORAL_TABLET | Freq: Two times a day (BID) | ORAL | 11 refills | 30 days
Start: 2022-10-24 — End: ?

## 2022-10-25 MED ORDER — MEKTOVI 15 MG TABLET
ORAL_TABLET | Freq: Two times a day (BID) | ORAL | 11 refills | 30 days | Status: CP
Start: 2022-10-25 — End: ?
  Filled 2022-11-01: qty 180, 30d supply, fill #0

## 2022-10-25 MED ORDER — ENCORAFENIB 75 MG CAPSULE
ORAL_CAPSULE | Freq: Every day | ORAL | 11 refills | 30 days | Status: CP
Start: 2022-10-25 — End: ?
  Filled 2022-11-01: qty 180, 30d supply, fill #0

## 2022-10-28 ENCOUNTER — Telehealth: Admit: 2022-10-28 | Discharge: 2022-10-29 | Payer: PRIVATE HEALTH INSURANCE

## 2022-10-28 DIAGNOSIS — F325 Major depressive disorder, single episode, in full remission: Principal | ICD-10-CM

## 2022-10-28 DIAGNOSIS — F411 Generalized anxiety disorder: Principal | ICD-10-CM

## 2022-10-28 DIAGNOSIS — C801 Malignant (primary) neoplasm, unspecified: Principal | ICD-10-CM

## 2022-10-28 NOTE — Unmapped (Signed)
Comprehensive Cancer Support Program (CCSP) - Psychiatry Outpatient Clinic   After Visit Summary    It was a pleasure to see you today in the [facility]s Comprehensive Cancer Support Program (CCSP). The CCSP is a multidisciplinary program dedicated to helping patients, caregivers, and families with cancer treatment, recovery and survivorship.      To schedule, cancel, or change your appointment:  Please call the [facility] schedulers at [phone], Monday through Friday 8AM - 5PM.  Someone will return your call within 24 hours.      If you have a question about your medicines or you need to contact your provider:  First, try sending a My Chart message to [name], PMHNP. If you are unable to do so, please call the CCSP program coordinator, [facility], at [phone].     For after hours urgent issues, you may call [phone] or call the I need to talk line at [phone] ([phone]) anytime 24/7.    CCSP Patient and Family Resource Center: [phone].    CCSP Website:  [email or web address]    For prescription refills, please allow at least 24 hours (during business hours, M-F) for providers to call in refills to your pharmacy. We are generally unable to accommodate same-day requests for refills.     If you are taking any controlled substances (such as anxiety or sleep medications), you must use them as the directions say to use them. We generally do not provide early refills over the phone without clear reason, and it would be inappropriate to obtain the medications from other doctors. We routinely use the [place] controlled substance database to monitor prescription drug use.

## 2022-10-28 NOTE — Unmapped (Signed)
[facility]  Psychiatry--Comprehensive Cancer Support Program   Established Patient E&M Service - Outpatient       Assessment:    Kenneth Fitzgerald [facility] presents for follow-up evaluation. He states that he was feeling less depressed and anxious, so he weaned himself off of his duloxetine 3-4 months ago. Endorsing mild anxiety and intermittent episodes of low mood. Sleeping well at night. Some worry reported over finances, but overall managing his mild anxiety well. He continues to meet with [name], PhD for psychotherapy. Given that he is not in need of psychiatric medications at this time, I told him that I will be available to him as needed.     Identifying Information:  Kenneth Fitzgerald is a 47 y.o. 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      Risk Assessment:  An assessment of suicide and violence risk factors was performed as part of this evaluation and is not significantly increased from the last visit.   While future psychiatric events cannot be accurately predicted, the patient does not currently require acute inpatient psychiatric care and does not currently meet [facility] involuntary commitment criteria.      Plan:    Problem 1: Recurrent major depressive disorder  Status of problem: chronic and stable  Interventions:   Patient weaned off of his duloxetine 30 mg BID 3-4 months ago. He does not feel that he needs medication at this time.  Continue psychotherapy with [name], PhD     Problem 2: Anxiety   Status of problem:  chronic with mild exacerbation  Interventions:   See MDD plan above      Psychotherapy provided:  No billable psychotherapy service provided.    Patient has been given this writer's contact information as well as the [facility] urgent line number. The patient has been instructed to call 911 for emergencies.      Subjective:    Chief complaint:  Follow-up psychiatric evaluation for anxiety and depression    Interval History:   Patient states that 3-4 months ago, he weaned himself off of his duloxetine. Since he has been without the duloxetine, he has not recognized an increase in depressive or anxiety symptoms. Endorses occasional mild episodes of low mood and mild anxiety, but manageable. Denies ruminations and irritability. He recognizes that his anxiety can be moderate in severity before scans, which he finds is manageable. Sleeping well at night.     Prior Psychiatric Medication Trials: bupropion, lorazepam, melatonin, sertraline, mirtazapine (helpful for sleep)     Psychiatric Review of Systems  Depressive Symptoms: denies  Manic Symptoms:  denies  Psychosis Symptoms: N/A  Anxiety Symptoms: mild anxiety  Obsession/Compulsions: N/A  Panic Symptoms:  denies  Trauma Symptoms: denies  Sleep disturbance:  denies      Objective:      Mental Status Exam:  Appearance:    Appears stated age, Well nourished, and Clean/Neat   Motor:   No abnormal movements   Speech/Language:    Normal rate, volume, tone, fluency   Mood:    OK   Affect:   Calm, Cooperative, and Euthymic   Thought process and Associations:   Logical, linear, clear, coherent, goal directed   Abnormal/psychotic thought content:     Denies SI, HI, self harm, delusions, obsessions, paranoid ideation, or ideas of reference   Perceptual disturbances:     Denies  auditory and visual hallucinations, behavior not concerning for response to internal stimuli     Other:   Insight and judgment intact     I personally spent 28 minutes face-to-face and non-face-to-face in the care of this patient, which includes all pre, intra, and post visit time on the date of service.    Visit was completed by video (or phone) and the appropriate disclaimer has been included below.    The patient reports they are physically located in [place] and is currently: at home. I conducted a audio/video visit. I spent 13 minutes on the video call with the patient. I spent an additional 15 minutes on pre- and post-visit activities on the date of service .        Sheran Lawless, PMHNP  10/28/2022

## 2022-10-28 NOTE — Unmapped (Signed)
[facility] and Home Delivery Pharmacy Refill Coordination Note    Specialty Medication(s) to be Shipped:   Hematology/Oncology: Braftovi 75mg  and [name] 15mg     Other medication(s) to be shipped: No additional medications requested for fill at this time     Kenneth Fitzgerald, DOB: 23-Jan-1975  Phone: [phone] (home)       All above HIPAA information was verified with patient.     Was a Nurse, learning disability used for this call? No    Completed refill call assessment today to schedule patient's medication shipment from the [facility] Delivery [facility]  [phone]).  All relevant notes have been reviewed.     Specialty medication(s) and dose(s) confirmed: Regimen is correct and unchanged.   Changes to medications: Donavin reports no changes at this time.  Changes to insurance: No  New side effects reported not previously addressed with a pharmacist or physician: None reported  Questions for the pharmacist: No    Confirmed patient received a Conservation officer, historic buildings and a Surveyor, mining with first shipment. The patient will receive a drug information handout for each medication shipped and additional FDA Medication Guides as required.       DISEASE/MEDICATION-SPECIFIC INFORMATION        N/A    SPECIALTY MEDICATION ADHERENCE     Medication Adherence    Patient reported X missed doses in the last month: 0  Specialty Medication: BRAFTOVI 75 mg capsule (encorafenib)  Patient is on additional specialty medications: Yes  Additional Specialty Medications: [name] 15 mg tablet (binimetinib)  Patient Reported Additional Medication X Missed Doses in the Last Month: 0  Patient is on more than two specialty medications: No  Informant: patient              Were doses missed due to medication being on hold? No    Braftovi 75 mg: 7 days of medicine on hand   [name] 15 mg: 7 days of medicine on hand       REFERRAL TO PHARMACIST     Referral to the pharmacist: Not needed      [facility]     Shipping address confirmed in Epic.       Delivery Scheduled: Yes, Expected medication delivery date: 10/31/22.     Medication will be delivered via UPS to the prescription address in Epic WAM.    [name]   [facility]  Specialty Technician

## 2022-10-30 ENCOUNTER — Ambulatory Visit: Admit: 2022-10-30 | Discharge: 2022-10-30 | Payer: PRIVATE HEALTH INSURANCE

## 2022-10-30 ENCOUNTER — Other Ambulatory Visit: Admit: 2022-10-30 | Discharge: 2022-10-30 | Payer: PRIVATE HEALTH INSURANCE

## 2022-10-30 ENCOUNTER — Ambulatory Visit
Admit: 2022-10-30 | Discharge: 2022-10-30 | Payer: PRIVATE HEALTH INSURANCE | Attending: Hematology & Oncology | Primary: Hematology & Oncology

## 2022-10-30 DIAGNOSIS — C439 Malignant melanoma of skin, unspecified: Principal | ICD-10-CM

## 2022-10-30 DIAGNOSIS — R0982 Postnasal drip: Principal | ICD-10-CM

## 2022-10-30 LAB — COMPREHENSIVE METABOLIC PANEL
ALBUMIN: 3.9 g/dL (ref 3.4–5.0)
ALKALINE PHOSPHATASE: 101 U/L (ref 46–116)
ALT (SGPT): 62 U/L — ABNORMAL HIGH (ref 10–49)
ANION GAP: 7 mmol/L (ref 5–14)
AST (SGOT): 47 U/L — ABNORMAL HIGH (ref <=34)
BILIRUBIN TOTAL: 0.2 mg/dL — ABNORMAL LOW (ref 0.3–1.2)
BLOOD UREA NITROGEN: 19 mg/dL (ref 9–23)
BUN / CREAT RATIO: 19
CALCIUM: 9.1 mg/dL (ref 8.7–10.4)
CHLORIDE: 107 mmol/L (ref 98–107)
CO2: 26 mmol/L (ref 20.0–31.0)
CREATININE: 1 mg/dL
EGFR CKD-EPI (2021) MALE: >90 mL/min/{1.73_m2} (ref >=60)
GLUCOSE RANDOM: 82 mg/dL (ref 70–179)
POTASSIUM: 4.1 mmol/L (ref 3.4–4.8)
PROTEIN TOTAL: 7.2 g/dL (ref 5.7–8.2)
SODIUM: 140 mmol/L (ref 135–145)

## 2022-10-30 LAB — CBC W/ AUTO DIFF
BASOPHILS ABSOLUTE COUNT: 0.1 10*9/L (ref 0.0–0.1)
BASOPHILS RELATIVE PERCENT: 1.2 %
EOSINOPHILS ABSOLUTE COUNT: 0.4 10*9/L (ref 0.0–0.5)
EOSINOPHILS RELATIVE PERCENT: 4.9 %
HEMATOCRIT: 39.6 % (ref 39.0–48.0)
HEMOGLOBIN: 13.7 g/dL (ref 12.9–16.5)
LYMPHOCYTES ABSOLUTE COUNT: 1.3 10*9/L (ref 1.1–3.6)
LYMPHOCYTES RELATIVE PERCENT: 17.9 %
MEAN CORPUSCULAR HEMOGLOBIN CONC: 34.5 g/dL (ref 32.0–36.0)
MEAN CORPUSCULAR HEMOGLOBIN: 29.2 pg (ref 25.9–32.4)
MEAN CORPUSCULAR VOLUME: 84.5 fL (ref 77.6–95.7)
MEAN PLATELET VOLUME: 8.7 fL (ref 6.8–10.7)
MONOCYTES ABSOLUTE COUNT: 0.6 10*9/L (ref 0.3–0.8)
MONOCYTES RELATIVE PERCENT: 8.4 %
NEUTROPHILS ABSOLUTE COUNT: 4.8 10*9/L (ref 1.8–7.8)
NEUTROPHILS RELATIVE PERCENT: 67.6 %
PLATELET COUNT: 194 10*9/L (ref 150–450)
RED BLOOD CELL COUNT: 4.69 10*12/L (ref 4.26–5.60)
RED CELL DISTRIBUTION WIDTH: 15.5 % — ABNORMAL HIGH (ref 12.2–15.2)
WBC ADJUSTED: 7.1 10*9/L (ref 3.6–11.2)

## 2022-10-30 MED ADMIN — iohexol (OMNIPAQUE) 350 mg iodine/mL solution 75 mL: 75 mL | INTRAVENOUS | @ 16:00:00 | Stop: 2022-10-30

## 2022-10-30 MED ADMIN — gadopiclenol injection 10 mL: 10 mL | INTRAVENOUS | @ 16:00:00 | Stop: 2022-10-30
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
## 2022-10-30 NOTE — Unmapped (Signed)
Radiation Oncology Telemedicine Follow Up Visit    Encounter Date: 11/06/2022  Patient Name: Kenneth Fitzgerald  Medical Record Number: [id number]  Patient identity verbally confirmed: Yes.  Verbal consent for telemedicine encounter: Yes.  Location of patient:  [address]  [address] [place] [address]   Location of provider [place], [place]): [place], [place]  Encounter medium: video and audio  Software platform: [employer]  Attendees (and relation to patient): patient and this Clinical research associate  Total time: 13 minutes  Janell Quiet, [place]  November 06 2022   9:24 AM      Diagnoses:  1. Adhesive capsulitis of right shoulder    2. Metastasis to brain (CMS-HCC)          Treatment Site:   whole brain to 30Gy in 10 fx completed 01/20/20  Right neck to 48 Gy in 20 fx completed 07/09/13  2 left frontal lesions and right cingulate gyrus lesion to 20 Gy in 1 fraction completed 05/07/22     Interval Since Completion of Treatment: ~6 months since most recent        Assessment:  Kenneth Fitzgerald is a 47 y.o. 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     He initially had a Stage III scalp melanoma in 2012, pT2pN2, s/p WLE and 3/3 positive SLN's, completion right level 2 neck dissection on 11/06/10 (0/25), one year of interferon completed 01/2012, and then right neck dissection 03/29/13 showing 1+/20 nodes (1cm with no ECE).      He also has multiply recurrent thyroid cancer in the same right neck dissection and is s/p RAI on 05/21/13.  Completed post-op EBRT to right neck to 48 Gy 07/09/13.     He has had stable extracranial disease on dabrafenib/trametinib and was noted to have multiple punctate lesions (best seen on T1 noncontrast sequence) in the brain suspicious for metastasis that have been increasing in number since January 2020.  He completed WBRT to 30 Gy on 01/20/20.     MRI 04/27/22 demonstrated growth of 3 lesions, he is now s/p radiation to 3 progressive mets as above completed 05/07/22.    MRI Brain 10/30/22 was personally reviewed and reviewed and discussed with Dr Flonnie Hailstone, demonstrating stability.          Plan:     -Disease Status: intracranial control, NED chest on scan from 10/30/22; Stable in Ab/Pelv     -Care Plan: systemic therapy braftovi/mektovi per Dr Nedra Hai; will see ENT for maxillary sinus disease     -Continuing memantine since tolerating well and feels that memory has improved.     -Follow-up: 3 months with MRI brain (coordinated with Dr. Nedra Hai)     INTERVAL HISTORY:    He notes worsened sinus drainage since WBRT. Using flonase and allegra. Has appt with ENT early next month.  Has occasional headaches, attributes this to sinus issues.       REVIEW OF SYSTEMS:  A comprehensive review of 10 systems was negative except for pertinent positives noted in HPI.      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)    Malignant neoplasm of thyroid gland (CMS-HCC)    Postoperative hypothyroidism    Hypocalcemia    Thyroid cancer (CMS-HCC)    Primary melanoma of head and neck (CMS-HCC)    Ventral hernia with bowel obstruction    Insomnia    Crohn's  disease of large intestine without complication (CMS-HCC)    Metastasis to brain (CMS-HCC)    Gout    01/24/2020: Open, primary ventral hernia repair for SBO    Morbid obesity with BMI of 40.0-44.9, adult (CMS-HCC)    Nontoxic uninodular goiter    Major depressive disorder in remission (CMS-HCC)    Lumbosacral spondylosis without myelopathy    Diverticulitis of colon (without mention of hemorrhage)(562.11)    Depressive disorder    Diverticulosis of colon    Iron deficiency anemia    Suicidal ideation    Atherosclerotic cardiovascular disease    HLD (hyperlipidemia)    Anxiety associated with cancer diagnosis (CMS-HCC)    Recurrent major depressive disorder in partial remission (CMS-HCC)    Immunization declined       PAST MEDICAL HISTORY/FAMILY HISTORY/SOCIAL HISTORY:  Reviewed in EPIC    ALLERGIES/MEDICATIONS:  Reviewed in EPIC      OBJECTIVE:  Karnofsky/Lansky Performance Status: 90,  Able to carry on normal activity; minor signs or symptoms of disease (ECOG equivalent 0)  Vitals: Not performed/obtained  General: No acute distress, alert and oriented  Pulm: No increased work of breathing, speaking in full sentences  Neuro:  Speech clear/fluent, no expressive aphasia, comprehension full  Psych: Normal mood and affect        The patient reports they are physically located in [place] and is currently: at home. I conducted a audio/video visit. I spent  49m 23s on the video call with the patient. I spent an additional 5 minutes on pre- and post-visit activities on the date of service .          Sydnee Levans, PA-C  Department of Radiation Oncology  [facility]  [address], [id number]  [place], [place] [address]  O: [phone]  10/30/22 10:27 AM

## 2022-10-30 NOTE — Unmapped (Signed)
Kenneth Fitzgerald [facility] 's entire shipment will be delayed as a result of prior authorization being required by the patient's insurance.     I have reached out to the patient  at [phone]  and left a voicemail message.  We will call the patient back to reschedule the delivery upon resolution. We have not confirmed the new delivery date.

## 2022-11-04 ENCOUNTER — Telehealth: Admit: 2022-11-04 | Discharge: 2022-11-05 | Payer: PRIVATE HEALTH INSURANCE | Attending: Clinical | Primary: Clinical

## 2022-11-04 DIAGNOSIS — F411 Generalized anxiety disorder: Principal | ICD-10-CM

## 2022-11-04 DIAGNOSIS — C801 Malignant (primary) neoplasm, unspecified: Principal | ICD-10-CM

## 2022-11-04 DIAGNOSIS — F331 Major depressive disorder, recurrent, moderate: Principal | ICD-10-CM
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
## 2022-11-06 ENCOUNTER — Ambulatory Visit
Admit: 2022-11-06 | Discharge: 2022-11-07 | Payer: PRIVATE HEALTH INSURANCE | Attending: Radiation Oncology | Primary: Radiation Oncology

## 2022-11-13 NOTE — Unmapped (Signed)
REASON FOR VISIT:  Colitis     HISTORY OF PRESENT ILLNESS:  Since last visit,  08/29/2021, colonoscopy to cecum.  Colocolonic anastomosis in descending colon.  Friable IC valve.  Scattered pseudopolyps in transverse colon.  Diverticula throughout.  Erythematous, granular mucosa from 0-20 cm.  Biopsies of colon and rectum showed chronic active colitis.  08/30/2022, ANA, RF, CCP antibodies all negative.  25 vitamin D 23.8  09/02/2022, abdominal pain, back pain, vomiting.  10/30/2022, MRI abdomen.  Motion artifact.  Decreased size of previous right liver lesion.  No new or progressive metastatic disease.  No signs of inflammation.  10/30/2022, CBC, CMP normal except AST 47, ALT 62    One month ago, had llq abdominal pain. Worried about possible ileus so switched to liquid diet x 4 days and pain resolved.  Over last 2 weeks, stools have softened and are difficult to clean up. No liquid stools or blood. Has 1 bm per day. Not taking loperamide or fiber. Has mild intermittent random llq pain--lasts 1-2 hrs and self resolves. Taking ssz 1g po bid. Chronic postnasal drip that causes gagging and worse llq pain.    MEDICATIONS:  has a current medication list which includes the following prescription(s): acetaminophen, mektovi, blood sugar diagnostic, blood-glucose meter, calcitriol, calcium carbonate, duloxetine, encorafenib, ezetimibe, allegra allergy, fluticasone propionate, folic acid, guaifenesin, ibuprofen, lancets, levothyroxine, memantine, ofloxacin, ondansetron, and sulfasalazine.    ALLERGIES:  Allergies as of 11/14/2022 - Reviewed 10/30/2022   Allergen Reaction Noted    Compazine [prochlorperazine] Itching 09/21/2011    Coconut Nausea And Vomiting 05/14/2012       PAST MEDICAL HISTORY:  1.  Crohn's colitis.  01/09/2018, colonoscopy done for melanoma staging incidentally showed chronic active colitis in transverse colon.  No diagnosis or treatment was made.  01/2018, immunotherapy for melanoma.  03/2018, bloody diarrhea thought to be due to immunotherapy.  Stopped immunotherapy starting prednisone with improvement in symptoms.  07/2019, slight worsening of bowel frequency.  PET/CT showed increased uptake in transverse colon.  08/24/2019, colonoscopy showed signs of Crohn's colitis.  09/30/2019, start sulfasalazine. 01/24/2020, ventral and incisional hernia repair due to recurrent incarcerated hernia.  2.  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  Treated with immunotherapy in 01/2018 which caused diarrhea prompting cessation.  Currently on targeted oral therapy.  3.  Thyroid cancer diagnosed incidentally on PET/CT done for melanoma in 2013.  Status post thyroidectomy with 2 recurrences.  4.  Recurrent diverticulitis in 2012.  Sigmoid colectomy and June 2012 with pathology showing acute and chronic inflammatory changes as well as perforated diverticulitis.  5.  Ankylosing spondylitis diagnosed 2010.  Treated with prednisone and Humira for 1 year.  Also took sulfasalazine.  Stopped Humira due to melanoma diagnosis.  6.  Ventral hernias.  7.  Depression  8.  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    SOCIAL HISTORY:  Lives alone with his dogs. Has brothers and sisters who live in [place].    FAMILY HISTORY:  No IBD or colon cancer    PHYSICAL EXAM:  There were no vitals taken for this visit.  Wt Readings from Last 4 Encounters:   10/30/22 (!) 145.7 kg (321 lb 3.2 oz)   10/07/22 (!) (P) 143.3 kg (316 lb)   08/30/22 (!) 143.2 kg (315 lb 12.8 oz)   07/17/22 (!) 142 kg (313 lb 1.6 oz)     CONSTITUTIONAL: awake, alert, NAD    TEST DATA:  1.  01/09/2018, colonoscopy to IC valve.  Colocolonic anastomosis in the rectum.  Transverse colon diverticulosis, granularity.  Biopsies showed chronic active colitis with no CMV  2.  05/12/2019, PET/CT.  Increased uptake in soft tissue density in transverse colon.  Multiple tiny peritoneal implants.  Status post sigmoid colectomy.  Multiple ventral hernias.  Stable enlarged hypermetabolic abdominal and pelvic lymph nodes and peritoneal implants.  3.  08/05/2019, CBC, CMP normal  4.  08/24/2019, colonoscopy to terminal ileum.  Internal hemorrhoids.  Colocolonic anastomosis in the rectum.  Pancolonic diverticulosis left> right.  Patchy erythema, granularity, edema, ulcers, pseudopolyps from 0-58 cm.  Tattoo placed 5 cm distal to area of pseudopolyps.  Dark mucosa in mid transverse colon.  Normal ileum.  Transverse colon biopsy normal.  Transverse colon mass biopsy showed moderate chronic active colitis, CMV negative.  Transverse colon biopsy showed ulceration.  Transverse biopsies all showed increased lymphoid infiltration.  Cecum biopsy showed ulcers with large atypical cells possibly due to reactive atypia less likely lymphoma.  No signs of melanoma.  5. 11/25/2019, MRI abdomen.  Extensive motion artifact.  Stable metastatic peritoneal implants, indeterminate L2 lesion, right portal vein thrombosis.  No signs of bowel inflammation.  6. 03/22/2020, MRI AP.  1.1 cm T1 liver lesions.  Lobulated appearing gallbladder.  Pancreatic divisum.  14 cm spleen.  Under distended thick-walled bladder.  Tiny hiatal hernia.  No bowel inflammation.  Similar appearance of metastatic implants.  1.1 cm left inguinal lymph nodes that are stable.  7.5 cm rim-enhancing fluid collection in midline anterior abdominal wall.  1.1 cm enhancing lesion in left psoas.  7. 08/29/2021, colonoscopy to cecum.  Colocolonic anastomosis in descending colon.  Friable IC valve.  Scattered pseudopolyps in transverse colon.  Diverticula throughout.  Erythematous, granular mucosa from 0-20 cm.  Biopsies of colon and rectum showed chronic active colitis.  8.  08/30/2022, ANA, RF, CCP antibodies all negative.  25 vitamin D 23.8  9.  10/30/2022, MRI abdomen.  Motion artifact.  Decreased size of previous right liver lesion.  No new or progressive metastatic disease.  No signs of inflammation.    ASSESSMENT:  1.  Crohn's colitis. Symptomatic remission on low-dose sulfasalazine.  Continue this indefinitely.  Somewhat Softer Stools over the Last 2 Weeks Could Reflect Worsening dietary changes versus functional GI disorder versus inflammation.  Treat with loperamide.  If that does not help, then check fecal calprotectin.  If positive, then increase sulfasalazine.  2.  Elevated transaminases.  Most likely due to MASLD.  Continue efforts at weight loss.  3.  Intermittent abdominal pain in the left lower quadrant.  Probably due to abdominal wall pain given positional nature.  Less likely due to intestinal inflammation.  Could try lidocaine patch or hyoscyamine in the future if symptoms become more persistent.    RECOMMENDATIONS AND PLAN:  Patient Instructions   -Loperamide 2mg  by mouth twice daily to try and firm up stools.  If that doesn't work, then increase to 4mg  by mouth twice daily.  -If soft stools don't resolve with above treatment, then let me know and we can order a fecal calprotectin to be run at [facility].  If that is positive, then would consider increasing sulfasalazine to 2g (4 pills) by mouth twice daily.  -If abdominal pains become severe or persistent, then let me know.  -Telehealth visit with me in 1 year or sooner if needed.

## 2022-11-14 ENCOUNTER — Ambulatory Visit
Admit: 2022-11-14 | Discharge: 2022-11-15 | Payer: BLUE CROSS/BLUE SHIELD | Attending: Gastroenterology | Primary: Gastroenterology

## 2022-11-14 DIAGNOSIS — K5010 Crohn's disease of large intestine without complications: Principal | ICD-10-CM

## 2022-11-14 MED ORDER — FOLIC ACID 1 MG TABLET
ORAL_TABLET | Freq: Every day | ORAL | 3 refills | 90 days | Status: CP
Start: 2022-11-14 — End: ?

## 2022-11-14 MED ORDER — SULFASALAZINE 500 MG TABLET
ORAL_TABLET | Freq: Two times a day (BID) | ORAL | 3 refills | 90 days | Status: CP
Start: 2022-11-14 — End: 2023-11-09

## 2022-11-14 NOTE — Unmapped (Addendum)
-  Loperamide 2mg  by mouth twice daily to try and firm up stools.  If that doesn't work, then increase to 4mg  by mouth twice daily.  -If soft stools don't resolve with above treatment, then let me know and we can order a fecal calprotectin to be run at [facility].  If that is positive, then would consider increasing sulfasalazine to 2g (4 pills) by mouth twice daily.  -If abdominal pains become severe or persistent, then let me know.  -Telehealth visit with me in 1 year or sooner if needed.

## 2022-11-21 ENCOUNTER — Ambulatory Visit
Admit: 2022-11-21 | Discharge: 2022-11-22 | Payer: BLUE CROSS/BLUE SHIELD | Attending: Student in an Organized Health Care Education/Training Program | Primary: Student in an Organized Health Care Education/Training Program

## 2022-11-21 DIAGNOSIS — R0982 Postnasal drip: Principal | ICD-10-CM

## 2022-11-21 DIAGNOSIS — J310 Chronic rhinitis: Principal | ICD-10-CM

## 2022-11-21 MED ORDER — AMOXICILLIN 875 MG-POTASSIUM CLAVULANATE 125 MG TABLET
ORAL_TABLET | Freq: Two times a day (BID) | ORAL | 0 refills | 14 days | Status: CP
Start: 2022-11-21 — End: 2022-12-05

## 2022-11-21 MED ORDER — DULOXETINE 30 MG CAPSULE,DELAYED RELEASE
ORAL_CAPSULE | Freq: Two times a day (BID) | ORAL | 2 refills | 0 days
Start: 2022-11-21 — End: ?

## 2022-11-21 NOTE — Unmapped (Signed)
[facility] Refill Coordination Note    Specialty Medication(s) to be Shipped:   Hematology/Oncology: Braftovi 75mg  and [name] 15mg     Other medication(s) to be shipped: No additional medications requested for fill at this time     Kenneth Fitzgerald, DOB: [birth year]  Phone: [phone] (home)       All above HIPAA information was verified with patient.     Was a Nurse, learning disability used for this call? No    Completed refill call assessment today to schedule patient's medication shipment from the [facility]  [phone]).  All relevant notes have been reviewed.     Specialty medication(s) and dose(s) confirmed: Regimen is correct and unchanged.   Changes to medications: Jd reports no changes at this time.  Changes to insurance: No  New side effects reported not previously addressed with a pharmacist or physician: None reported  Questions for the pharmacist: No    Confirmed patient received a Conservation officer, historic buildings and a Surveyor, mining with first shipment. The patient will receive a drug information handout for each medication shipped and additional FDA Medication Guides as required.       DISEASE/MEDICATION-SPECIFIC INFORMATION        N/A    SPECIALTY MEDICATION ADHERENCE     Medication Adherence    Patient reported X missed doses in the last month: 0  Specialty Medication: BRAFTOVI 75 mg capsule (encorafenib)  Patient is on additional specialty medications: Yes  Additional Specialty Medications: [name] 15 mg tablet (binimetinib)  Patient Reported Additional Medication X Missed Doses in the Last Month: 0  Patient is on more than two specialty medications: No  Informant: patient              Were doses missed due to medication being on hold? No    Braftovi 75 mg: 14 days of medicine on hand   [name] 15 mg: 14 days of medicine on hand       REFERRAL TO PHARMACIST     Referral to the pharmacist: Not needed      SHIPPING     Shipping address confirmed in Epic.     /  Delivery Scheduled: Yes, Expected medication delivery date: 12/02/22.     Medication will be delivered via UPS to the prescription address in Epic WAM.    [name]   [facility]  Specialty Technician

## 2022-11-21 NOTE — Unmapped (Signed)
Otolaryngology Clinic Note    Kenneth Fitzgerald is a 47 y.o. male is seen in consultation at the request of [name]  for evaluation of post nasal drip.       History of Present Illness:     The patient is a 47 y.o. 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 who presents for the evaluation of post nasal drip. He is s/p radiation therapy. He is experiencing significant post nasal drip to a point of ???gagging???. He is using Flonase and taking Allegra. He is not doing rinses with Neilmed. He has not been on antibiotics for a year.    Patients medical records were personally reviewed.      A 12 point review of systems was negative except as indicated.  The patient denies fevers, chills, shortness of breath, chest pain, nausea, vomiting, diarrhea, inability to lie flat, dysphagia, odynophagia, hemoptysis, hematemesis, changes in vision, changes in voice quality, otalgia, otorrhea, vertiginous symptoms, focal deficits, or other concerning symptoms.    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    Past Surgical History     has a past surgical history that includes pr removal nodes, neck,cerv cmplt (Left, 12/07/2012); pr removal nodes, neck,cerv mod rad (Right, 03/29/2013); pr lap, ventral hernia repair,reducible (N/A, 04/07/2014); pr repair recurr incis hernia,reduc (N/A, 01/03/2015); pr implant mesh hernia repair/debridement closure (N/A, 01/03/2015); pr repair recurr incis hernia,strang (N/A, 06/21/2016); pr colonoscopy w/biopsy single/multiple (N/A, 01/09/2018); pr sigmoidoscopy,fine needl bx,us guided (N/A, 01/09/2018); pr colonoscopy w/biopsy single/multiple (N/A, 08/24/2019); pr colsc flx with directed submucosal njx any sbst (N/A, 08/24/2019); pr exploratory of abdomen (N/A, 01/24/2020); pr freeing bowel adhesion,enterolysis (N/A, 01/24/2020); pr repair recurr incis hernia,strang (N/A, 01/24/2020); pr negative pressure wound therapy dme >50 sq cm (N/A, 01/24/2020); pr colonoscopy flx dx w/collj spec when pfrmd (N/A, 08/29/2021); and pr colonoscopy w/biopsy single/multiple (N/A, 08/29/2021).    Current Medications    Current Outpatient Medications   Medication Sig Dispense Refill    acetaminophen (TYLENOL) 500 MG tablet Take 2 tablets (1,000 mg total) by mouth every eight (8) hours as needed for pain. 30 tablet 0    binimetinib ([name]) 15 mg tablet Take 3 tablets (45 mg total) by mouth two (2) times a day. 180 tablet 11    blood sugar diagnostic Strp Dispense 100 blood glucose test strips, ok to sub any brand preferred by insurance/patient, use 3x/day; dispense whatever brand matches with meter. 100 strip 12    blood-glucose meter kit Use as instructed; dispense 1 meter, whatever is preferred by insurance 1 each 1    calcitriol (ROCALTROL) 0.25 MCG capsule Take 1 capsule (0.25 mcg total) by mouth daily. 90 capsule 3    calcium carbonate 650 mg calcium (1,625 mg) tablet Take 1 tablet (650 mg elem calcium total) by mouth Three (3) times a day with a meal.      DULoxetine (CYMBALTA) 30 MG capsule Take 1 capsule (30 mg total) by mouth two (2) times a day. (Patient taking differently: Take  1 capsule (30 mg total) by mouth daily.) 180 capsule 3    encorafenib (BRAFTOVI) 75 mg capsule Take 6 capsules (450 mg total) by mouth daily. 180 capsule 11    ezetimibe (ZETIA) 10 mg tablet Take 1 tablet (10 mg total) by mouth daily. 90 tablet 3    fexofenadine (ALLEGRA ALLERGY) 180 MG tablet Take 1 tablet (180 mg total) by mouth daily as needed (allergies).      fluticasone propionate (FLONASE) 50 mcg/actuation nasal spray 1 spray into each nostril daily.      folic acid (FOLVITE) 1 MG tablet Take 1 tablet (1,000 mcg total) by mouth daily. 90 tablet 3    guaiFENesin (MUCINEX) 600 mg 12 hr tablet Take 1 tablet (600 mg total) by mouth every twelve (12) hours.      ibuprofen (ADVIL,MOTRIN) 200 MG tablet Take 3 tablets (600 mg total) by mouth daily as needed for pain.      lancets Misc Dispense 100 lancets, ok to sub any brand preferred by insurance/patient, use 3x/day 100 each 12    levothyroxine (SYNTHROID) 125 MCG tablet Take 375 mcg daily; please dispense 90-day supply 270 tablet 3    ofloxacin (OCUFLOX) 0.3 % ophthalmic solution Instill 4 drops in left ear twice daily for 5 days 5 mL 3    ondansetron (ZOFRAN) 8 MG tablet Take 1 tablet (8 mg total) by mouth Take as directed.      sulfaSALAzine (AZULFIDINE) 500 mg tablet Take 2 tablets (1,000 mg total) by mouth two (2) times a day. 360 tablet 3     No current facility-administered medications for this visit.       Allergies    Allergies   Allergen Reactions    Compazine [Prochlorperazine] Itching    Coconut Nausea And Vomiting       Family History    Negative for bleeding disorders or free bleeding.     family history includes Arrhythmia in his mother; Colon cancer in his paternal grandmother; Coronary artery disease in his father; Diabetes in his father; Hypertension in his father; Hyperthyroidism in his mother; Osteoporosis in his mother; Prostate cancer in his father; Squamous cell carcinoma in his mother.    Social History:     reports that he has never smoked. He has never used smokeless tobacco.   reports current alcohol use.   reports no history of drug use.    Review of Systems    A 12 system review of systems was performed and is negative other than that noted in the history of present illness.    Vital Signs  There were no vitals taken for this visit.      Physical Exam    General: Well-developed, well-nourished. Appropriate, comfortable, and in no apparent distress.  Head/Face: On external examination there is no obvious asymmetry or scars. On palpation there is no tenderness over maxillary sinuses or masses within the salivary glands. Cranial nerves V and VII are intact through all distributions.  Eyes: PERRL, EOMI, the conjunctiva are not injected and sclera is non-icteric.  Ears: On external exam, there is no obvious lesions or asymmetry. The EACs are bilaterally without cerumen or lesions. The TMs are in the neutral position and are mobile to pneumatic otoscopy bilaterally. There are no middle ear masses or fluid noted. Hearing is grossly intact bilaterally.  Nose: On external exam there are neither lesions nor asymmetry of the nasal tip/ dorsum. On anterior rhinoscopy, visualization posteriorly is limited on anterior examination. For this  reason, to adequately evaluate posteriorly for masses, polypoid disease and/or signs of infections, nasal endoscopy is indicated (see procedure below).  Oral cavity/oropharynx: The mucosa of the lips, gums, hard and soft palate, posterior pharyngeal wall, tongue, floor of mouth, and buccal region are without masses or lesions and are normally hydrated. Good dentition. Tongue protrudes midline. Tonsils are normal appearing. Supraglottis not visualized due to gag reflex.  Neck: There is no asymmetry or masses. Trachea is midline. There is no enlargement of the thyroid or palpable thyroid nodules.   Lymphatics: There is no palpable lymphadenopathy along the jugulodiagastric, submental, or posterior cervical chains.  Chest: No audible wheeze, unlabored respirations.  Cardiovascular: Regular rate.  GI: Nondistended.  Neurologic: Cranial nerve???s II-XII are grossly intact. Exam is non-focal.  Extremities: No cyanosis, clubbing or edema.    Procedures:  Diagnostic Bilateral Nasal Endoscopy performed on 11/21/2022 (CPT 31231)    NOTE: Nasal endoscopy is performed for the sinuses only, and not for examination of the skull base, septum or inferior turbinates, nor is it related to any previously performed septoplasty or inferior turbinate surgery or skull base surgery.     Surgeon: Egbert Garibaldi, MD  Anesthesia: none  Procedure Detail:  As a result of inability to visualize the intranasal anatomy, and after discussion of the potential risks related to the procedure (primarily bleeding), a endoscope is used to examine the left and right sinonasal cavities, including the interior of the nasal cavity and the middle and superior meatus, the turbinates, and the spheno-ethmoid recess. All these areas were inspected.    Findings:    Left: Left sided septal deviation. Middle turbinate intact. No polyps, no purulence, no crusting.    Right: Purulence coming out right maxillary sinus. Middle turbinate is intact. Mild right sided septal deviation.     Oretha Ellis Nasal Endoscopy Score: [employer] is used to assess the degree of inflammation of the sinonasal structures, including the middle and superior turbinates, the ethmoid sinuses, maxillary sinuses, frontal sinuses, and sphenoid sinuses.  In the presence of previous surgery, some or all of these structures may be absent.    Left        Polyps:  Absent (0)   Edema:   Absent (0)   Discharge:  None (0)    Scarring:  Absent (0)   Crusting:  None (0)      Total Left:  0     Right         Polyps:  Absent (0)   Edema:  Absent (0)   Discharge: Thick, Purulent (2)    Scarring:  Absent (0)   Crusting:  None (0)      Total Right:   2      Labs and Diagnostic Tests  No labs reviewed.       Assessment:  The patient is a 47 y.o. 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 who presents for the evaluation of:  Post nasal drip.     Recommendations:  1. I've explained that he possibly has maxillary sinus infection likely secondary to infection in his tooth. Will send in a prescription of Augmentin for 14 days. The risks of antibiotics were discussed at length with the patient, including stomach upset, sun  sensitivity including severe sun burn, tendonitis with rare rupture, and diarrhea.  The patient was advised to wear sun protection when outside, to take daily probiotics, and to minimize activities if joint pain occurs. Advised to stop antibiotics if any of these side effects occur and to notify physician.     Provided a flyer for NeilMed squeeze bottle.     2.Ordered a CT head with follow-up in 1-3 months. Will send in a referral to medical dentistry for radiation problems.    The patient's physical examination findings including flexible fiberoptic nasopharyngolaryngosocpy/sinonasal endoscopy were thoroughly discussed.    The patient voiced complete understanding of plan as detailed above and is in full agreement.    November 21, 2022, 10:55 AM. Egbert Garibaldi, MD obtained and performed the history, physical exam and medical decision-making elements that were entered into the chart. Signed by Maurice March, Scribe, on August 15, 2022 at 10:55 AM.    ----------------------------------------------------------------------------------------------------------------------  November 21, 2022 10:55 AM. Documentation assistance provided by the Scribe. I was present during the time the encounter was recorded. The information recorded by the Scribe was done at my direction and has been reviewed and validated by me.  ----------------------------------------------------------------------------------------------------------------------

## 2022-11-29 MED FILL — BRAFTOVI 75 MG CAPSULE: ORAL | 30 days supply | Qty: 180 | Fill #1

## 2022-11-29 MED FILL — MEKTOVI 15 MG TABLET: ORAL | 30 days supply | Qty: 180 | Fill #1

## 2022-12-30 ENCOUNTER — Ambulatory Visit
Admit: 2022-12-30 | Discharge: 2022-12-31 | Payer: BLUE CROSS/BLUE SHIELD | Attending: Student in an Organized Health Care Education/Training Program | Primary: Student in an Organized Health Care Education/Training Program

## 2022-12-30 DIAGNOSIS — C7931 Secondary malignant neoplasm of brain: Principal | ICD-10-CM

## 2022-12-30 DIAGNOSIS — E890 Postprocedural hypothyroidism: Principal | ICD-10-CM

## 2022-12-30 DIAGNOSIS — E782 Mixed hyperlipidemia: Principal | ICD-10-CM

## 2022-12-30 DIAGNOSIS — I2510 Atherosclerotic heart disease of native coronary artery without angina pectoris: Principal | ICD-10-CM

## 2022-12-30 DIAGNOSIS — C434 Malignant melanoma of scalp and neck: Principal | ICD-10-CM

## 2022-12-30 DIAGNOSIS — M791 Myalgia, unspecified site: Principal | ICD-10-CM

## 2022-12-30 DIAGNOSIS — Z2821 Immunization not carried out because of patient refusal: Principal | ICD-10-CM

## 2022-12-30 DIAGNOSIS — Z125 Encounter for screening for malignant neoplasm of prostate: Principal | ICD-10-CM

## 2022-12-30 DIAGNOSIS — K5010 Crohn's disease of large intestine without complications: Principal | ICD-10-CM

## 2022-12-30 DIAGNOSIS — R7401 Transaminitis: Principal | ICD-10-CM

## 2022-12-30 DIAGNOSIS — F32A Depressive disorder: Principal | ICD-10-CM

## 2022-12-30 DIAGNOSIS — J310 Chronic rhinitis: Principal | ICD-10-CM

## 2022-12-30 DIAGNOSIS — C73 Malignant neoplasm of thyroid gland: Principal | ICD-10-CM

## 2022-12-30 LAB — LIPID PANEL
CHOLESTEROL/HDL RATIO SCREEN: 6.3 — ABNORMAL HIGH (ref 1.0–4.5)
CHOLESTEROL: 239 mg/dL — ABNORMAL HIGH (ref <=200)
HDL CHOLESTEROL: 38 mg/dL — ABNORMAL LOW (ref 40–60)
LDL CHOLESTEROL CALCULATED: 144 mg/dL — ABNORMAL HIGH (ref 40–99)
NON-HDL CHOLESTEROL: 201 mg/dL — ABNORMAL HIGH (ref 70–130)
TRIGLYCERIDES: 284 mg/dL — ABNORMAL HIGH (ref 0–150)
VLDL CHOLESTEROL CAL: 56.8 mg/dL — ABNORMAL HIGH (ref 11–50)

## 2022-12-30 LAB — PSA: PROSTATE SPECIFIC ANTIGEN: 0.53 ng/mL (ref 0.00–4.00)

## 2022-12-30 NOTE — Unmapped (Signed)
Assessment and Plan:   Diagnoses and all orders for this visit:    Prostate cancer screening  -     PSA (Prostate Specific Antigen)    Mixed hyperlipidemia  -     Lipid Panel    Malignant neoplasm of thyroid gland (CMS-HCC)    Transaminitis  -     US Abdomen Limited; Future    Chronic rhinitis    Crohn's disease of large intestine without complication (CMS-HCC)    Postoperative hypothyroidism    Atherosclerotic cardiovascular disease    Metastasis to brain (CMS-HCC)    Immunization declined    Myalgia    Depressive disorder    Primary melanoma of head and neck (CMS-HCC)    ---------------  47 yo male presents for follow up     His PMH is complicated by: Stage IV Malignant melanoma, brain metastasis, portal vein thrombosis, Thyroid cancer, hypothyroidism, hypocalcemia, Atypical Crohns  -He is following with Complex case management, Psychiatry, Hematology/Oncology, Endocrinology, ENT, GI    (1) Malignant Melanoma, Metastasis to brain  -He is following with Heme/onc, Surgical oncology and Neurosurgery.  -Last Onc visit from 10/30/2022.   1. Stage IV melanoma: BRAF+ by IHC. Kenneth Kenneth Fitzgerald reviewed path: TILS present, non-brisk; lymphocytes are 20 to 30% the number of melanoma cells.  Known sites of disease include abdominal/pelvic LN, and brain. Started dabrafenib/trametinib mid-May 2020, has had stable disease extracranially.   - Reviewed path from hernia surgery in Jan 2022, noted that the abdominal wall/peritoneal implants contained melanophages, but no melanoma cells.  We discussed that this is interesting, good to know that there wasn't melanoma in the biopsy, is in keeping w/immune response.    - He is s/p WBRT, completed 01/20/20.   - He completed SRS to 3 progressive brain mets 04/2022 w/good tolerability  - Personally reviewed CT chest, MRI brain, abdomen today. There is no evidence of progression.    - Cont BRAF/MEK targeted therapy;  mild LFT elevation noted.  Will CTM.    - RTC in 3mos for clinical follow up and repeat imaging.  -Of note, we have attempted to confirm BRAF on NGS twice, on original primary specimen from July 2012 and and pelvic LN sample from 01/09/2018.  Both samples were insufficient tissue for analysis.  At this point, BRAF is known by IHC only.   - cont local dermatology follow up     #L hip pain; unlikely related to melanoma as all other imaging stable. - encouraged local ortho follow up; we can obtain additional imaging if the pain persists or per ortho rec  #PND- ENT referral placed per his request as no improvement w/flonase, allegrea  #Elevated CK- no cramps, not checked today; -possibly related to encorafenib/binimetinib, also noted previously when he received adjuvant INF years ago.  -PI notes holding drug iv 10X ULN, so will repeat if muscle cramping worsens.  #Fe deficiency anemia; likely GI source in setting of Crohn's - feels better s/p 1 dose of IV iron 11/2021- Hgb normal and stable today at 13.7   #.) health maintenance/CAD on CT chest  --follows w/PCP for ongoing care   #.) Atypical Crohn's; recent colonoscopy 08/29/21 w/severe colitis, moderate proctitis. -- Cont sulfasalazine per GI medicine.  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 During hospital stay, it was determined that this was malignant thrombus and no AC recommended.  Will follow.   #.) Thyroid CA, recurrent. - Well controlled now. Followed by endocrinology q59mos  #.) Mood; excessive stress  related to h/o 2 malignancies, job loss and legal issues surrounding, parents both passed away in recent years. --cont follow up w/Kenneth Fitzgerald and [name].   #.) HypoCA; normal today-cont Ca supplementation  #.) Supportive care/GOC;appreciate pall care support and med titration. Discussed w/Kenneth Fitzgerald; OK to pause pall care for now given well controlled disease and well controlled symptoms    Visit with Rad/Onc on 11/06/22:   -Kenneth Fitzgerald is a 47 y.o. 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    -He initially had a Stage III scalp melanoma in 2012, pT2pN2, s/p WLE and 3/3 positive SLN's, completion right level 2 neck dissection on 11/06/10 (0/25), one year of interferon completed 01/2012, and then right neck dissection 03/29/13 showing 1+/20 nodes (1cm with no ECE).     -He also has multiply recurrent thyroid cancer in the same right neck dissection and is s/p RAI on 05/21/13.  Completed post-op EBRT to right neck to 48 Gy 07/09/13.    -  He has had stable extracranial disease on dabrafenib/trametinib and was noted to have multiple punctate lesions (best seen on T1 noncontrast sequence) in the brain suspicious for metastasis that have been increasing in number since January 2020.  He completed WBRT to 30 Gy on 01/20/20.    -MRI 04/27/22 demonstrated growth of 3 lesions, he is now s/p radiation to 3 progressive mets as above completed 05/07/22.    -MRI Brain 10/30/22 was personally reviewed and reviewed and discussed with Kenneth Fitzgerald, demonstrating stability.   Plan:  -Disease Status: intracranial control, NED chest on scan from 10/30/22; Stable in Ab/Pelv  -Care Plan: systemic therapy braftovi/mektovi per Kenneth Fitzgerald; will see ENT for maxillary sinus disease  -Continuing memantine since tolerating well and feels that memory has improved.  -Follow-up: 3 months with MRI brain (coordinated with Kenneth. Nedra Fitzgerald)    (2) Thyroid cancer, hypothyroidism, hypocalcemia  -Following with Endocrinology, Visit on 10/07/2022:   1. T1N1b papillary thyroid cancer (classic/follicular variant), multifocal, s/p 3 surgeries and 2 doses of I-131. Last treatment was in 2015. Has indeterminate response to treatment based on persistent detectable Tg (< 1 on suppression, rose to 2.4 with stimulation in Jan 2018).  He also has stage IV melanoma, followed by oncology.  -Thyrogen-stimulated WBS was negative in 01/2016; stimulated Tg was 2.4  -Tg on suppression has been stable, with fluctuations commensurate with fluctuations in TSH, i.e.  Tg was 0.3 in 08/2019 (with TSH of 1.7) and was 0.6 in 09/2021 (with TSH of 9.045). Tg Ab has been negative. Repeat Tg/Ab today.  -Last [place] 04/03/22 stable, repeat US ~ 03/2024 if Tg/Ab stable and clinically appropriate  2. Hypothyroidism, euthyroid on several checks while taking current dose of levothyroxine 375 mcg daily  -TFTs in normal range 10/07/2022  -**Following both TSH and FT4 re: brain XRT x 10 days from 12/2019-01/2020, and again in 04/2022 --> Free T4 10/07/2022 on the lower end of normal range, with low normal TSH, so may need to LT4 adjust dose upwards based on future lab trend and clinical status  -Reviewed appropriate administration, and factors that can affect dose needs or laboratory measurement  3. Hypocalcemia, severe, started calcitriol 2017. Low calcium in 2019 and in 2020 when off of calcitriol.  Thereafter, levels have generally been at or close to goal when he is taking calcitriol regularly and getting enough dietary or supplemental calcium.  -Calcium normal on multiple recent lab checks, no symptoms at present  -  Continue calcitriol 0.25 mcg/day, and calcium 1200-1500 mg/day through combination of diet and supplements, difficult for him to take supplement regularly  -Reviewed goals of treatment and symptoms or signs with which to take extra calcium and/or notify us.  -Fitzgerald visit scheduled 06/07/2023    (3) Hypertension  BP Readings from Last 3 Encounters:   12/30/22 130/80   11/14/22 146/98   10/30/22 155/90   -His BP is better today compared to previously.   -We discussed previously starting an anti-hypertensive, however he has declined   -Continue to monitor of medications for now. However low threshold to start Low-Dose Amlodipine    (4) Atherosclerotic Calcification, Hyperlipidemia  The ASCVD Risk score (Arnett DK, et al., 2019) failed to calculate. However his lipid panel has been abnormal for >1 year.   -He reports compliance to [facility] now. Previously declined starting statin due to ADE.   -We will see what his Lipid panel is now. To continue Zeita 10mg  every day.  -He does have a history of Rhabdomyolysis (although not from statin) could still be a CI to starting a statin. Given concern for ADE with statin, he may benefit from Repatha.     (5) Iron Deficiency Anemia  -Received Iron infusion in 11/2021. Stable, continue to monitor     (6) Atypical Crohn's, Elevated LFT  -Following with GI for Atypical Crohn's. Last Colonoscopy was 08/29/2021.   -He did see GI on 11/14/22:   ASSESSMENT:  1.  Crohn's colitis.  Symptomatic remission on low-dose sulfasalazine.  Continue this indefinitely.  Somewhat Softer Stools over the Last 2 Weeks Could Reflect Worsening dietary changes versus functional GI disorder versus inflammation.  Treat with loperamide.  If that does not help, then check fecal calprotectin.  If positive, then increase sulfasalazine.  2.  Elevated transaminases.  Most likely due to MASLD.  Continue efforts at weight loss.  3.  Intermittent abdominal pain in the Fitzgerald lower quadrant.  Probably due to abdominal wall pain given positional nature.  Less likely due to intestinal inflammation.  Could try lidocaine patch or hyoscyamine in the future if symptoms become more persistent.  RECOMMENDATIONS AND PLAN:  Patient Instructions   -Loperamide 2mg  by mouth twice daily to try and firm up stools.  If that doesn't work, then increase to 4mg  by mouth twice daily.  -If soft stools don't resolve with above treatment, then let me know and we can order a fecal calprotectin to be run at [facility].  If that is positive, then would consider increasing sulfasalazine to 2g (4 pills) by mouth twice daily.  -If abdominal pains become severe or persistent, then let me know.  -Telehealth visit with me in 1 year or sooner if needed  -Currently prescribed Sulfasalazine 2000mg  BID.   -Will obtain US Limited of the liver to assess for MASLD    (7) Depression  -Continue to follow closely with psychiatry with last visit on 11/04/22. He has tapered off Duloxetine   -To continue to follow with Psychology/Psychiatry     (8)Chronic Rhinitis  -Referred to ENT, evaluated on 11/21/2022:   1. I've explained that he possibly has maxillary sinus infection likely secondary to infection in his tooth. Will send in a prescription of Augmentin for 14 days. The risks of antibiotics were discussed at length with the patient, including stomach upset, sun sensitivity including severe sun burn, tendonitis with rare rupture, and diarrhea.  The patient was advised to wear sun protection when outside, to take daily probiotics, and to minimize  activities if joint pain occurs. Advised to stop antibiotics if any of these side effects occur and to notify physician.   Provided a flyer for [employer] squeeze bottle.     2.Ordered a CT head with follow-up in 1-3 months. Will send in a referral to medical dentistry for radiation problems  -ENT visit scheduled: 02/20/2023    (9)Arthralgia, unspecified joint, Myalgias, Ankylosing spondylitis, unspecified site of spine (CMS-HCC)  -He does have history of Ankylosing spondylitis in 2009/2010. He was treated with Prednisone and Enbrel.   -He continues to have myalgia and joint pain. However work up thus far has been negative.   -At this time, it appears is symptoms could be secondary to medications (Binimetinib and Encorafenib)     Health Maintenance  -Declined all vaccinations   -history of portal vein thrombosis---following with hematology: thought to have been from malignant thrombus--no anticoagulation recommended  -history of Low Back pain, Radiculopathy---no concerns at this time  -Given Medical History, will check PSA      CPE in 4-6 months     I personally spent 40 minutes face-to-face and non-face-to-face in the care of this patient, which includes all pre, intra, and post visit time on the date of service.  All documented time was specific to the E/M visit and does not include any procedures that may have been performed.        Subjective:     Kenneth Fitzgerald [facility] y.o.male  is here for follow up     SINCE LAST VISIT  -He has had to go on Abx for his sinuses   -He is not sleeping okay   -he does get home from work and go to sleep on the couch (around 7:15pm) . Then he will get up around 2 am and go to bed.     He is taking the Zeita   He is fasting this morning     He notes that he has had 2 promotions since our last visit, he is now a Financial planner. He now has more responsibility, 4 days a week (25 hours a week). He does like his work     He notes his joint pain and muscle pain are around the same.     He did see ENT as he had sinus concerns that caused gagging sensation in the morning.       ROS:     Review of systems negative unless otherwise noted as per HPI  Denies: Chest pain, palpitations, shortness of breath, lightheadedness, dizziness, shortness of breath, syncope/presyncope      Vital Signs:     Wt Readings from Last 3 Encounters:   12/30/22 (!) 143.8 kg (317 lb 1.6 oz)   11/14/22 (!) 144.1 kg (317 lb 9.6 oz)   10/30/22 (!) 145.7 kg (321 lb 3.2 oz)     Temp Readings from Last 3 Encounters:   12/30/22 36.7 ??C (98.1 ??F) (Temporal)   11/14/22 36.4 ??C (97.5 ??F)   10/30/22 36.9 ??C (98.5 ??F) (Oral)     BP Readings from Last 3 Encounters:   12/30/22 130/80   11/14/22 146/98   10/30/22 155/90     Pulse Readings from Last 3 Encounters:   12/30/22 80   11/14/22 79   10/30/22 87       Objective:      General Appearance: Alert, cooperative, in no distress  HENT: Normocephalic, atraumatic, EOMI, conjunctiva clear, no nasal congestion. TM do not appear erythematous, retracted or perforated.  CV: RRR  RESP: Normal respiratory effort.  Clear to auscultation bilaterally without wheezes, rhonchi or crackles.   EXT:  No significant lower extremity edema.   MSK: gait is stable  NEURO: Cranial nerves II- XII grossly intact.  No focal neurologic deficits. AOx3  PSYCHIATRIC: Pleasant

## 2022-12-31 DIAGNOSIS — C439 Malignant melanoma of skin, unspecified: Principal | ICD-10-CM

## 2022-12-31 NOTE — Unmapped (Signed)
[facility] Clinical Assessment & Refill Coordination Note    Kenneth [facility], DOB: 1975/11/21  Phone: [phone] (home)     All above HIPAA information was verified with patient.     Was a Nurse, learning disability used for this call? No    Specialty Medication(s):   Hematology/Oncology: [name]     Current Outpatient Medications   Medication Sig Dispense Refill    acetaminophen (TYLENOL) 500 MG tablet Take 2 tablets (1,000 mg total) by mouth every eight (8) hours as needed for pain. 30 tablet 0    binimetinib ([name]) 15 mg tablet Take 3 tablets (45 mg total) by mouth two (2) times a day. 180 tablet 11    blood sugar diagnostic Strp Dispense 100 blood glucose test strips, ok to sub any brand preferred by insurance/patient, use 3x/day; dispense whatever brand matches with meter. 100 strip 12    blood-glucose meter kit Use as instructed; dispense 1 meter, whatever is preferred by insurance 1 each 1    calcitriol (ROCALTROL) 0.25 MCG capsule Take 1 capsule (0.25 mcg total) by mouth daily. 90 capsule 3    calcium carbonate 650 mg calcium (1,625 mg) tablet Take 1 tablet (650 mg elem calcium total) by mouth Three (3) times a day with a meal.      encorafenib (BRAFTOVI) 75 mg capsule Take 6 capsules (450 mg total) by mouth daily. 180 capsule 11    ezetimibe (ZETIA) 10 mg tablet Take 1 tablet (10 mg total) by mouth daily. 90 tablet 3    fexofenadine (ALLEGRA ALLERGY) 180 MG tablet Take 1 tablet (180 mg total) by mouth daily as needed (allergies).      fluticasone propionate (FLONASE) 50 mcg/actuation nasal spray 1 spray into each nostril daily.      folic acid (FOLVITE) 1 MG tablet Take 1 tablet (1,000 mcg total) by mouth daily. 90 tablet 3    ibuprofen (ADVIL,MOTRIN) 200 MG tablet Take 3 tablets (600 mg total) by mouth daily as needed for pain.      lancets Misc Dispense 100 lancets, ok to sub any brand preferred by insurance/patient, use 3x/day 100 each 12    levothyroxine (SYNTHROID) 125 MCG tablet Take 375 mcg daily; please dispense 90-day supply 270 tablet 3    ofloxacin (OCUFLOX) 0.3 % ophthalmic solution Instill 4 drops in left ear twice daily for 5 days 5 mL 3    ondansetron (ZOFRAN) 8 MG tablet Take 1 tablet (8 mg total) by mouth Take as directed.      sulfaSALAzine (AZULFIDINE) 500 mg tablet Take 2 tablets (1,000 mg total) by mouth two (2) times a day. 360 tablet 3     No current facility-administered medications for this visit.        Changes to medications: Kenneth Fitzgerald reports no changes at this time.    Allergies   Allergen Reactions    Compazine [Prochlorperazine] Itching    Coconut Nausea And Vomiting       Changes to allergies: No    SPECIALTY MEDICATION ADHERENCE     Braftovi 75 mg: 10 days of medicine on hand   [name] 15 mg: 10 days of medicine on hand     Medication Adherence    Patient reported X missed doses in the last month: 0  Specialty Medication: Braftovi 75mg   Patient is on additional specialty medications: Yes  Additional Specialty Medications: [name] 15mg   Patient Reported Additional Medication X Missed Doses in the Last Month: 0  Demonstrates understanding of importance of adherence: yes  Informant: patient  Patient is at risk for Non-Adherence: No  Reasons for non-adherence: no problems identified  Confirmed plan for next specialty medication refill: delivery by pharmacy  Refills needed for supportive medications: not needed          Specialty medication(s) dose(s) confirmed: Regimen is correct and unchanged.     Are there any concerns with adherence? No    Adherence counseling provided? Not needed    CLINICAL MANAGEMENT AND INTERVENTION      Clinical Benefit Assessment:    Do you feel the medicine is effective or helping your condition? Yes    Clinical Benefit counseling provided? Not needed    Adverse Effects Assessment:    Are you experiencing any side effects? No    Are you experiencing difficulty administering your medicine? No    Quality of Life Assessment:    Quality of Life      Oncology  1. What impact has your specialty medication had on the reduction of your daily pain or discomfort level?: None  2. On a scale of 1-10, how would you rate your ability to manage side effects associated with your specialty medication? (1=no issues, 10 = unable to take medication due to side effects): 1            How many days over the past month did your condition/medication  keep you from your normal activities? For example, brushing your teeth or getting up in the morning. 0    Have you discussed this with your provider? Not needed    Acute Infection Status:    Acute infections noted within Epic:  No active infections  Patient reported infection: None    Therapy Appropriateness:    Is therapy appropriate based on current medication list, adverse reactions, adherence, clinical benefit and progress toward achieving therapeutic goals? Yes, therapy is appropriate and should be continued     DISEASE/MEDICATION-SPECIFIC INFORMATION      N/A    Oncology: Is the patient receiving adequate infection prevention treatment? Not applicable  Does the patient have adequate nutritional support? Not applicable    PATIENT SPECIFIC NEEDS     Does the patient have any physical, cognitive, or cultural barriers? No    Is the patient high risk? Yes, patient is taking oral chemotherapy. Appropriateness of therapy as been assessed    Did the patient require a clinical intervention? No    Does the patient require physician intervention or other additional services (i.e., nutrition, smoking cessation, social work)? No    SOCIAL DETERMINANTS OF HEALTH     At the [facility], we have learned that life circumstances - like trouble affording food, housing, utilities, or transportation can affect the health of many of our patients.   That is why we wanted to ask: are you currently experiencing any life circumstances that are negatively impacting your health and/or quality of life? No    Social Drivers of Health     Food Insecurity: No Food Insecurity (11/05/2021)    Hunger Vital Sign     Worried About Running Out of Food in the Last Year: Never true     Ran Out of Food in the Last Year: Never true   Internet Connectivity: No Internet connectivity concern identified (11/05/2021)    Internet Connectivity     Do you have access to internet services: Yes     How do you connect to the internet: Personal Device at  home     Is your internet connection strong enough for you to watch video on your device without major problems?: Yes     Do you have enough data to get through the month?: Yes     Does at least one of the devices have a camera that you can use for video chat?: Yes   Housing/Utilities: Low Risk  (11/05/2021)    Housing/Utilities     Within the past 12 months, have you ever stayed: outside, in a car, in a tent, in an overnight shelter, or temporarily in someone else's home (i.e. couch-surfing)?: No     Are you worried about losing your housing?: No     Within the past 12 months, have you been unable to get utilities (heat, electricity) when it was really needed?: No   Tobacco Use: Low Risk  (12/30/2022)    Patient History     Smoking Tobacco Use: Never     Smokeless Tobacco Use: Never     Passive Exposure: Never   Transportation Needs: No Transportation Needs (11/05/2021)    PRAPARE - Transportation     Lack of Transportation (Medical): No     Lack of Transportation (Non-Medical): No   Alcohol Use: Not At Risk (11/05/2021)    Alcohol Use     How often do you have a drink containing alcohol?: Monthly or less     How many drinks containing alcohol do you have on a typical day when you are drinking?: 1 - 2     How often do you have 5 or more drinks on one occasion?: Never   Interpersonal Safety: Not At Risk (11/05/2021)    Interpersonal Safety     Unsafe Where You Currently Live: No     Physically Hurt by Anyone: No     Abused by Anyone: No   Physical Activity: Inactive (11/05/2021)    Exercise Vital Sign Days of Exercise per Week: 0 days     Minutes of Exercise per Session: 0 min   Intimate Partner Violence: Not At Risk (11/05/2021)    Humiliation, Afraid, Rape, and Kick questionnaire     Fear of Current or Ex-Partner: No     Emotionally Abused: No     Physically Abused: No     Sexually Abused: No   Stress: Stress Concern Present (11/05/2021)    [employer] of Occupational Health - Occupational Stress Questionnaire     Feeling of Stress : To some extent   Substance Use: Not on file (11/19/2022)   Social Connections: Moderately Isolated (11/05/2021)    Social Connection and Isolation Panel [NHANES]     Frequency of Communication with Friends and Family: More than three times a week     Frequency of Social Gatherings with Friends and Family: Once a week     Attends Religious Services: More than 4 times per year     Active Member of [employer] or Organizations: No     Attends Banker Meetings: Never     Marital Status: Never married   Physicist, medical Strain: Low Risk  (11/05/2021)    Overall Financial Resource Strain (CARDIA)     Difficulty of Paying Living Expenses: Not very hard   Depression: Not at risk (11/12/2021)    PHQ-2     PHQ-2 Score: 1   Health Literacy: Low Risk  (11/05/2021)    Health Literacy     : Never       Would you be willing  to receive help with any of the needs that you have identified today? Not applicable       SHIPPING     Specialty Medication(s) to be Shipped:   Hematology/Oncology: [name] and [name]    Other medication(s) to be shipped: No additional medications requested for fill at this time     Changes to insurance: No    Delivery Scheduled: Yes, Expected medication delivery date: 01/03/23.     Medication will be delivered via UPS to the confirmed prescription address in [facility].    The patient will receive a drug information handout for each medication shipped and additional FDA Medication Guides as required.  Verified that patient has previously received a Conservation officer, historic buildings and a Surveyor, mining.    The patient or caregiver noted above participated in the development of this care plan and knows that they can request review of or adjustments to the care plan at any time.      All of the patient's questions and concerns have been addressed.    Rollen Sox, [facility]   [facility] Specialty Pharmacist

## 2023-01-02 MED FILL — MEKTOVI 15 MG TABLET: ORAL | 30 days supply | Qty: 180 | Fill #2

## 2023-01-02 MED FILL — BRAFTOVI 75 MG CAPSULE: ORAL | 30 days supply | Qty: 180 | Fill #2

## 2023-01-07 MED ORDER — LEVOTHYROXINE 125 MCG TABLET
ORAL_TABLET | Freq: Every day | ORAL | 3 refills | 90.00 days | Status: CP
Start: 2023-01-07 — End: 2024-01-07

## 2023-01-26 DIAGNOSIS — C439 Malignant melanoma of skin, unspecified: Principal | ICD-10-CM

## 2023-01-30 ENCOUNTER — Other Ambulatory Visit: Admit: 2023-01-30 | Discharge: 2023-01-30 | Payer: BLUE CROSS/BLUE SHIELD

## 2023-01-30 ENCOUNTER — Inpatient Hospital Stay: Admit: 2023-01-30 | Discharge: 2023-01-30 | Payer: BLUE CROSS/BLUE SHIELD

## 2023-01-30 ENCOUNTER — Ambulatory Visit
Admit: 2023-01-30 | Discharge: 2023-01-30 | Payer: BLUE CROSS/BLUE SHIELD | Attending: Hematology & Oncology | Primary: Hematology & Oncology

## 2023-01-30 DIAGNOSIS — C439 Malignant melanoma of skin, unspecified: Principal | ICD-10-CM

## 2023-01-30 DIAGNOSIS — E278 Other specified disorders of adrenal gland: Principal | ICD-10-CM

## 2023-01-30 LAB — CBC W/ AUTO DIFF
BASOPHILS ABSOLUTE COUNT: 0 10*9/L (ref 0.0–0.1)
BASOPHILS RELATIVE PERCENT: 0.2 %
EOSINOPHILS ABSOLUTE COUNT: 0.3 10*9/L (ref 0.0–0.5)
EOSINOPHILS RELATIVE PERCENT: 4.4 %
HEMATOCRIT: 41.9 % (ref 39.0–48.0)
HEMOGLOBIN: 14.3 g/dL (ref 12.9–16.5)
LYMPHOCYTES ABSOLUTE COUNT: 1.4 10*9/L (ref 1.1–3.6)
LYMPHOCYTES RELATIVE PERCENT: 20.2 %
MEAN CORPUSCULAR HEMOGLOBIN CONC: 34 g/dL (ref 32.0–36.0)
MEAN CORPUSCULAR HEMOGLOBIN: 29 pg (ref 25.9–32.4)
MEAN CORPUSCULAR VOLUME: 85.3 fL (ref 77.6–95.7)
MEAN PLATELET VOLUME: 8.6 fL (ref 6.8–10.7)
MONOCYTES ABSOLUTE COUNT: 0.4 10*9/L (ref 0.3–0.8)
MONOCYTES RELATIVE PERCENT: 6.2 %
NEUTROPHILS ABSOLUTE COUNT: 4.7 10*9/L (ref 1.8–7.8)
NEUTROPHILS RELATIVE PERCENT: 69 %
PLATELET COUNT: 238 10*9/L (ref 150–450)
RED BLOOD CELL COUNT: 4.91 10*12/L (ref 4.26–5.60)
RED CELL DISTRIBUTION WIDTH: 16.2 % — ABNORMAL HIGH (ref 12.2–15.2)
WBC ADJUSTED: 6.8 10*9/L (ref 3.6–11.2)

## 2023-01-30 LAB — COMPREHENSIVE METABOLIC PANEL
ALBUMIN: 4.5 g/dL (ref 3.4–5.0)
ALKALINE PHOSPHATASE: 124 U/L — ABNORMAL HIGH (ref 46–116)
ALT (SGPT): 90 U/L — ABNORMAL HIGH (ref 10–49)
ANION GAP: 4 mmol/L — ABNORMAL LOW (ref 5–14)
AST (SGOT): 78 U/L — ABNORMAL HIGH (ref <=34)
BILIRUBIN TOTAL: 0.4 mg/dL (ref 0.3–1.2)
BLOOD UREA NITROGEN: 18 mg/dL (ref 9–23)
BUN / CREAT RATIO: 18
CALCIUM: 9.1 mg/dL (ref 8.7–10.4)
CHLORIDE: 104 mmol/L (ref 98–107)
CO2: 25 mmol/L (ref 20.0–31.0)
CREATININE: 0.99 mg/dL (ref 0.73–1.18)
EGFR CKD-EPI (2021) MALE: >90 mL/min/{1.73_m2} (ref >=60)
GLUCOSE RANDOM: 111 mg/dL (ref 70–179)
POTASSIUM: 4.1 mmol/L (ref 3.4–4.8)
PROTEIN TOTAL: 7.9 g/dL (ref 5.7–8.2)
SODIUM: 133 mmol/L — ABNORMAL LOW (ref 135–145)

## 2023-01-30 MED ORDER — DEXAMETHASONE 2 MG TABLET
ORAL_TABLET | Freq: Every morning | ORAL | 0 refills | 30.00 days | Status: CP
Start: 2023-01-30 — End: 2023-01-30
  Filled 2023-01-30: qty 60, 30d supply, fill #0

## 2023-01-30 MED ORDER — CALCITRIOL 0.25 MCG CAPSULE
ORAL_CAPSULE | Freq: Every day | ORAL | 0 refills | 30.00 days | Status: CP
Start: 2023-01-30 — End: ?

## 2023-01-30 MED ADMIN — dexAMETHasone (DECADRON) tablet 4 mg: 4 mg | ORAL | @ 21:00:00 | Stop: 2023-01-30

## 2023-01-30 MED ADMIN — gadopiclenol (ELUCIREM,VUEWAY) injection 10 mL: 10 mL | INTRAVENOUS | @ 17:00:00 | Stop: 2023-01-30

## 2023-01-30 NOTE — Unmapped (Signed)
PRIMARY CARE PHYSICIAN  Mangel, Benison Pap, DO  [address]  [place] [address]    CONSULTING PHYSICIANS  Lendell Caprice [place], [place]  [address]  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   -S/p thyroidectomy 06/2011 and radioactive iodine 03/2012 and again 05/2013.    -Thyroid CA recurrence 11/2012 s/p L neck dissecion 12/07/2012.  -2nd thyroid CA recurrence 03/2013 AND co-existing 1st melanoma recurrence 03/2013 s/p R -cervical level 2-4 node dissection and partial parotidectomy  -S/p radiation to R neck, completed 06/2013  -Nivolumab (02/12/18 x 2 cycles) complicated by colitis that required a 6 week steroid taper.   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 is a pleasant 48 y.o. male who presents today for further evaluation of recurrent stage IV melanoma. 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    PLAN  1. Stage IV melanoma: BRAF+ by IHC. Dr Eyvonne Left reviewed path: TILS present, non-brisk; lymphocytes are 20 to 30% the number of melanoma cells.  Known sites of disease include abdominal/pelvic LN, and brain. Started dabrafenib/trametinib mid-May 2020, has had stable disease extracranially.      - Reviewed path from hernia surgery in Jan 2022, noted that the abdominal wall/peritoneal implants contained melanophages, but no melanoma cells.  We discussed that this is interesting, good to know that there wasn't melanoma in the biopsy, is in keeping w/immune response.    - He is s/p WBRT, completed 01/20/20.   - He completed SRS to 3 progressive brain mets 04/2022 w/good tolerability  - Personally reviewed CT chest, MRI brain, abdomen today. There is no evidence of progression in extracranial organs. --Brain MRI from today reviewed and d/w rad/onc.  There is a lesion in R frontal cortex w/hemorrhage and edema.  He is entirely asx.  - We started dex in clinic - 4mg  dispensed prior to departure.  Sent dex rx to [facility] pharmacy so he could pick up today.   - Rad/onc team will reschedule his follow up visit to occur next week rather than later in the month as planned.   - He was given instructions to call ASAP if any new neuro symptoms, avoid NSAIDs, ASA.  - In meantime, cont BRAF/MEK targeted therapy for now, consider holding MEK as rare report of hemorrhage.  Will hold pending further discussion w/rad/onc.  - RTC in 3mos for clinical follow up and repeat imaging, rad/onc team plans to follow brain MRI sooner.  -Of note, we have attempted to confirm BRAF on NGS twice, on original primary specimen from July 2012 and and pelvic LN sample from 01/09/2018.  Both samples were insufficient tissue for analysis.  At this point, BRAF is known by IHC only.   - cont local dermatology follow up    #L hip pain; no change in pain  - encouraged local ortho follow up  - avoid NSAIDs    #Elevated CK- no cramps, not checked today;   -possibly related to encorafenib/binimetinib, also noted previously when he received adjuvant INF years ago.  -PI notes holding drug iv 10X ULN, so will repeat if muscle cramping worsens.    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   -- Cont sulfasalazine per GI medicine.    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 During hospital stay, it was determined that this was malignant thrombus and no AC recommended.  Will follow.     #.) Thyroid CA, recurrent.   - Well controlled now. Followed by endocrinology q104mos    #.) Mood; excessive stress related to h/o 2 malignancies, job loss and legal issues surrounding, parents both passed away in recent years.  Holidays were stressful.  He denies SI today.  --cont follow up w/[name] and [name].   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 Discussed w/[name]; OK to pause pall care for now given well controlled disease and well controlled symptoms.      INTERVAL HISTORY: This is a return visit to the [facility] for further evaluation of melanoma.  Is not sleeping well lately, falls asleep by 8pm, wakes at 2am and then the rest of the night.  Wakes at 830am.   Feels very tired the next day.   Hasn't taken much ibuprofen lately - just about 800mg  about once a week.   No ASA or other blood thinners  Denies any CNS symptoms whatsoever  No HA, balance problems, falls  Has some occ pain inside L ear  C/o L hip pain/groin pain, uchanged; hasn't seen ortho  Continues working at [employer]. 20-25hrs/week.   This is a much better fit for his schedule.  Hasn't missed doses of BRAF/MEK inhibitors.  No new side effects.  Continues on 2pills BID sulfasalazine, rare rectal bleeding.   No change in hearing.    No sig weight loss  Talks w/Justin tomorrow, duloxetine restarted        PAST MEDICAL HISTORY:   1. Diverticulitis.   2. History of ankylosing spondylitis in 2009. Please see past medical history dictated on 11/15/2010 for details. In brief, he was treated w/Enbrel and prednisone for 6mos or so.  Symptoms improved, immunosuppressants were stopped and symptoms did not recur.   3. S/p incisional hernia repair       SOCIAL HISTORY:    reports that he has never smoked. He has never been exposed to tobacco smoke. He has never used smokeless tobacco. He reports current alcohol use. He reports that he does not use drugs.      MEDICATIONS: Current Outpatient Medications   Medication Sig Dispense Refill    binimetinib ([name]) 15 mg tablet Take 3 tablets (45 mg total) by mouth two (2) times a day. 180 tablet 11    blood sugar diagnostic Strp Dispense 100 blood glucose test strips, ok to sub any brand preferred by insurance/patient, use 3x/day; dispense whatever brand matches with meter. 100 strip 12    blood-glucose meter kit Use as instructed; dispense 1 meter, whatever is preferred by insurance 1 each 1    calcium carbonate 650 mg calcium (1,625 mg) tablet Take 1 tablet (650 mg elem calcium total) by mouth Three (3) times a day with a meal.  encorafenib (BRAFTOVI) 75 mg capsule Take 6 capsules (450 mg total) by mouth daily. 180 capsule 11    ezetimibe (ZETIA) 10 mg tablet Take 1 tablet (10 mg total) by mouth daily. 90 tablet 3    fexofenadine (ALLEGRA ALLERGY) 180 MG tablet Take 1 tablet (180 mg total) by mouth daily as needed (allergies).      fluticasone propionate (FLONASE) 50 mcg/actuation nasal spray 1 spray into each nostril daily.      folic acid (FOLVITE) 1 MG tablet Take 1 tablet (1,000 mcg total) by mouth daily. 90 tablet 3    ibuprofen (ADVIL,MOTRIN) 200 MG tablet Take 3 tablets (600 mg total) by mouth daily as needed for pain.      lancets Misc Dispense 100 lancets, ok to sub any brand preferred by insurance/patient, use 3x/day 100 each 12    levothyroxine (SYNTHROID) 125 MCG tablet Take 3 tablets (375 mcg total) by mouth daily. 270 tablet 3    ofloxacin (OCUFLOX) 0.3 % ophthalmic solution Instill 4 drops in left ear twice daily for 5 days 5 mL 3    ondansetron (ZOFRAN) 8 MG tablet Take 1 tablet (8 mg total) by mouth Take as directed.      sulfaSALAzine (AZULFIDINE) 500 mg tablet Take 2 tablets (1,000 mg total) by mouth two (2) times a day. 360 tablet 3    acetaminophen (TYLENOL) 500 MG tablet Take 2 tablets (1,000 mg total) by mouth every eight (8) hours as needed for pain. 30 tablet 0    calcitriol (ROCALTROL) 0.25 MCG capsule Take 1 capsule (0.25 mcg total) by mouth daily. 30 capsule 0    dexAMETHasone (DECADRON) 2 MG tablet Take 2 tablets (4 mg total) by mouth every morning until further instructed. 60 tablet 0     No current facility-administered medications for this visit.       REVIEW OF SYSTEMS  A complete review of systems was obtained and was negative except for those symptoms listed in the HPI     PHYSICAL EXAM  Vitals:    01/30/23 1524   BP: 162/90   Pulse: 75   Temp: 36.8 ??C (98.2 ??F)   SpO2: 97%         GEN: Awake and alert, pleasant appearing male in no acute distress  LUNGS: normal WOB, CTAB  CV: NAD, S1S2  ABD; obese, soft NTND  SKIN: No rashes, petechiae or jaundice noted.  All melanoma scarlines examined; no nodularity.    PYSCH: Alert and oriented to person, place and time  EXT: No edema noted of the lower extremity noted.          LABS  Lab Results   Component Value Date    WBC 6.8 01/30/2023    HGB 14.3 01/30/2023    HCT 41.9 01/30/2023    PLT 238 01/30/2023       Lab Results   Component Value Date    NA 133 (L) 01/30/2023    K 4.1 01/30/2023    CL 104 01/30/2023    CO2 25.0 01/30/2023    BUN 18 01/30/2023    CREATININE 0.99 01/30/2023    GLU 111 01/30/2023    CALCIUM 9.1 01/30/2023    MG 1.8 05/30/2022    PHOS 4.4 10/07/2022       Lab Results   Component Value Date    BILITOT 0.4 01/30/2023    BILIDIR 0.20 01/27/2020    PROT 7.9 01/30/2023    ALBUMIN 4.5 01/30/2023    ALT  90 (H) 01/30/2023    AST 78 (H) 01/30/2023    ALKPHOS 124 (H) 01/30/2023    GGT 42 08/22/2011       Lab Results   Component Value Date    INR 1.12 01/24/2020       RADIOLOGY RESULTS  CT Chest Wo Contrast  Result Date: 01/30/2023   Impression: No new thoracic metastasis.    CT Sinus, Brainlab, Maxillofacial, without contrast  Result Date: 01/30/2023   Impression: Multiple intracranial metastatic mass lesions with dominant lesion within the right frontal lobe better evaluated on same day brain MRI. Bilateral maxillary sinus mucous retention cysts, left greater than right.     MRI Brain W Wo Contrast  Result Date: 01/30/2023   Impression: Interval enlargement of a right frontal cortical mass due to intratumoral hemorrhage with new surrounding vasogenic edema resulting in mass effect, partial effacement of the right lateral ventricle and 0.8 cm leftward midline shift. No evidence of cerebral herniation. Similar size and number of numerous additional metastatic lesions throughout the supratentorial and infratentorial brain. ++++++++++++++++++++ The findings of this study were discussed Epic chat with PA Janell Quiet and Dr. Ruben Reason by Dr. Jonelle Sports Loralee Pacas on 01/30/2023 11:55 PM. Patient will be contacted on an outpatient basis for subsequent evaluation. -----------------------------------------------    US Abdomen Limited  Result Date: 01/30/2023   Impression: 1. The liver is diffusely heterogeneous, moderately lobular with areas of retraction particularly within the right lobe. 2. Gallbladder was not confidently identified and may be contracted as queried on same-day MRI.Marland Kitchen Please see below for data measurements: Liver: 15.8 cm Gallbladder wall: 0.29 cm Sonographic Murphy sign was negative Pericholecystic fluid visualized: no Common bile duct: 0.34 cm      MRI Abdomen W Contrast  Result Date: 01/30/2023   Impression: Limited evaluation due to motion artifact. Within this limitation: - Stable size of the previously described T1 hyperintense foci within the right hepatic lobe without discernible enhancement noted on today's exam. No new hepatic lesions. - No new findings of metastatic disease within the abdomen.              Colonoscopy 08/29/21:  Impression:            - Patent functional end-to-end colo-colonic                          anastomosis, characterized by healthy appearing mucosa.                         - Friability and scarring with contact bleeding at the                          ileocecal valve. Biopsied.                         - Pseudopolyps in the transverse colon. Biopsied.                         - Diverticulosis in the entire examined colon.                         - Erythematous, granular, edematous mucosa in the  rectum. This is worse compared wtih prior and appears                          consistent with mildlly active Crohn's colitis.                          Biopsied to rule out melanoma.    08/29/21:  Diagnosis   A: Colon, biopsy:  -Severely active chronic colitis with erosion.     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       Echo 04/29/19:  Summary    1. The left ventricle is normal in size with normal wall thickness.    2. The left ventricular systolic function is normal, LVEF is visually  estimated at 55%.    3. The right ventricle is upper normal in size, with normal systolic  function.          PATHOLOGY:  Pelvic LN 01/09/18:  Final Diagnosis   A: Colon, transverse, biopsy  - Moderately-severely active chronic colitis  - No CMV viral cytopathic effect, granuloma, or dysplasia identified  - No metastatic melanoma or papillary thyroid cancer identified  - See comment     B: Lymph node, pelvic, core needle biopsy  - Metastatic melanoma  - See comment        FNA R femur 07/19/14:  : Bone, right femur, core biopsy with touch imprints  - No primary or metastatic malignancy identified  - Bone with focal osteocyte dropout, suggestive of necrosis  - Bone marrow with trilineage hematopoiesis and focal effacement by chronic inflammation (see comment)    B: Bone, right femur, fine needle aspiration  - Few atypical cells present on direct smears (see comment)  - Scant fragments of edematous stromal tissue in cell block  - Bone marrow with trilineage hematopoiesis    Diagnosis:  Lymph nodes, right cervical, levels 2-4, removal and partial parotidectomy  -Metastatic melanoma involving 1 out of 20 lymph nodes, with the largest  diameter measuring 10 mm (1.0 cm) and no evidence of extracapsular extension  (1/20)  -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

## 2023-01-31 ENCOUNTER — Encounter: Admit: 2023-01-31 | Discharge: 2023-02-01 | Payer: BLUE CROSS/BLUE SHIELD | Attending: Clinical | Primary: Clinical

## 2023-01-31 DIAGNOSIS — F411 Generalized anxiety disorder: Principal | ICD-10-CM

## 2023-01-31 DIAGNOSIS — F331 Major depressive disorder, recurrent, moderate: Principal | ICD-10-CM

## 2023-01-31 DIAGNOSIS — C801 Malignant (primary) neoplasm, unspecified: Principal | ICD-10-CM

## 2023-02-01 NOTE — Unmapped (Addendum)
Upcoming Appt:  Future Appointments   Date Time Provider Department Center   02/04/2023  9:00 AM Ruben Reason, MD [facility]   02/20/2023 10:45 AM Kimple, Adam [place], MD [facility]   04/07/2023 10:00 AM Reita Cliche, MD [facility]   04/30/2023 10:30 AM ADULT ONC LAB [facility]   04/30/2023 11:30 AM [name], MD [facility]   04/30/2023 12:20 PM IC CT RM 1 [facility] [place] - IC   04/30/2023 12:45 PM IC MRI MBL 1 [facility]  - IC   06/03/2023 10:20 AM Mangel, Benison Pap, DO [facility]     S: vomited one time at 0515 today  B: stated he has brin bleed and thyroid cancer and melanoma takes Braftovi 75mg , 6 capsules by mouth daily,  and mektovi 15 mg, take 3 tablets BID, last taken 02/01/23 0530  A: frontal sinus headache 3-4/10 for last 2 months, moderate nasal congestion, mild yellow green discharge today, afebrile, mild left ear pain x 2 weeks  R: dipso- see in 4 hours, pt declined going to ER and asked for MD on call paged, he was last seen in hem onc office 01/30/23 per pt     Disposition:  See Physician Within 4 Hours (or PCP Triage)    Is this a pediatric patient?   No   Any recent, relevant visit?   Yes   Date/location of recent visit?  01/30/23     Any related medications?  Yes   Name of medication and dose?  Targeted therapy     Any relevant medical history?   Yes   What history?  Oral chemo , thyroid cancer     Any interventions?   Yes   What has been done? (include related medication given, dose, and date/time)  Oral chemo    Reason for Disposition   Earache   [1] Vomiting AND [2] high-risk adult (e.g., brain tumor, V-P shunt, dialysis)    Answer Assessment - Initial Assessment Questions  1. LOCATION: Where does it hurt?       Frontal per pt   2. ONSET: When did the sinus pain start?  (e.g., hours, days)       The past couple of months   3. SEVERITY: How bad is the pain?   (Scale 1-10; mild, moderate or severe) 3-4/10 per pt   4. RECURRENT SYMPTOM: Have you ever had sinus problems before? If Yes, ask: When was the last time? and What happened that time?       Yes, saw ent , they want him to do neti pot, but he cannot do that, Thursday did a facial ct and still waiting on results per pt   5. NASAL CONGESTION: Is the nose blocked? If Yes, ask: Can you open it or must you breathe through your mouth?      Yes, moderate and breathing through nose per pt   6. NASAL DISCHARGE: Do you have discharge from your nose? If so ask, What color?      Mild discharge this morning, yellow, green per pt   7. FEVER: Do you have a fever? If Yes, ask: What is it, how was it measured, and when did it start?       No fever per pt   8. OTHER SYMPTOMS: Do you have any other symptoms? (e.g., sore throat, cough, earache, difficulty breathing)      No sore throat, no cough, left ear has  mild pain and spoke to hem onc doctor on 01/30/23 per pt , no difficulty breathing per pt   9. PREGNANCY: Is there any chance you are pregnant? When was your last menstrual period?      N/a  Pt stated his hem onc doctor stated he should not take any Tylenol or ibuprofen per pt    Answer Assessment - Initial Assessment Questions  1. SEVERITY - NAUSEA: How bad is the nausea? (e.g., none; mild, moderate, severe)      None per pt   2. SEVERITY - VOMITING: Are you vomiting? If Yes, ask: How many times have you vomited in the past 24 hours? (e.g., nausea only; mild, moderate or severe vomiting)      One vomit per pt   3. ONSET: When did the nausea and/or vomiting begin?       02/01/23 0515   4. MEDICINES FOR NAUSEA: Do you have any anti-nausea medicine? What medicines have you taken and when did you last take the medicine?      None per pt   5. ORAL INTAKE: If vomiting, Have you been able to keep any foods or fluids down? How much fluids have you had in the past 24 hours?      Today he ate a biscuit, he is drinking dr pepper, in the last 24 hours he has 6-8 ounces of doctor pepper, nothing else per pt   6. HYDRATION: Any signs of dehydration? (e.g., dry mouth [not just dry lips], too weak to stand, dizziness, new weight loss) When did you last urinate?      He has no dizziness, is walking normally, last void- 0500 today, 02/01/23 per pt   7. ABDOMEN PAIN: Are you having any abdomen pain? If Yes, ask: How bad is it? What       No abdominal pain per pt   8. DIARRHEA: Is there any diarrhea? If Yes, ask: How many times today?       No diarrhea per pt   9. CANCER: What type of cancer do you have?         Thyroid cancer and melanoma and stated he has a brain bleed per pt   11. CANCER - TREATMENT: What cancer treatments have you received? When did you last receive them? (e.g., chemotherapy, immunotherapy, radiation, or recent surgery). Note: Triager with access to patient's medical record should review treatments and administration dates.        Taking oral chemo targeted therapy per pt   12. CANCER - NEUTROPENIA RISK: Have you received chemotherapy recently? If Yes, ask: When was it and what was your last WBC and ANC (absolute neutrophil count)? Were you told that your white cell count was low? Note: Triager with access to patient's medical record should review most recent labs. An ANC less than 1,000 - 1,500 means that the neutrophils are low and the immune system is weak.        No per pt   13. CAUSE: What do you think is causing your nausea and/or vomiting? (e.g., chemotherapy, new medications, the cancer itself, other family members with similar symptoms)        Sinus related per pt   14. PREVIOUS NAUSEA AND VOMITING: If you have had nausea before, what have you done in the past to make it better?        The last time was 2 weeks ago and similar to this and went away on its own, once moving it  gets better per pt   15. OTHER SYMPTOMS: Do you have any other symptoms? 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 No vomiting blood per pt.  Dispo- see in 4 hours, pt stated he would like to have MD on call paged. This RN advised pt to call back if he has not heard back from Ambulance person after one hour or if he worsens, concerns, new symptoms. Pt with verbal understanding and stated he has nno other questions and ended the call. Page request sent to Nurse Catering manager.    Protocols used: Sinus Pain or Congestion-A-AH, Cancer - Nausea and Vomiting-A-AH

## 2023-02-01 NOTE — Unmapped (Signed)
[facility]  Psychiatry  Psychotherapy Note - Telehealth via Video     Service Date: January 31, 2023  Service: 51 minutes of psychotherapy via video session  Time with Patient: 51 minutes     Encounter Description: This encounter was conducted from provider's office via EPIC video session due to COVID-19 pandemic. Kenneth Fitzgerald was located in his home. Session was conducted via EPIC.  Rationale for video session is need to social distance. See Plan for telemedicine consent/disclaimer.      Encounter Description/Consent:   Kenneth Fitzgerald visit was completed through telehealth encounter (video).      This patient encounter is appropriate and reasonable under the circumstances. The patient has been advised of the potential risks and limitations of this mode of treatment (including, but not limited to, the absence of in-person examination) and has agreed to be treated in a remote fashion in spite of them. Any and all of the patient's/patient's family's questions on this issue have been answered.      The patient was physically located in [place] in which I am permitted to provide care. The patient understood that he may incur co-pays and cost sharing, and agreed to the telemedicine visit. The visit was reasonable and appropriate under the circumstances given the patient's presentation at the time.      Time Spent: 51 minutes     Assessment:  Kenneth Fitzgerald is a 48yo male with a history of metastatic melanoma and thyroid cancer who I see for psychotherapy to address depression and adjustment to multiple stressors.  We met today via telehealth.  Kenneth Fitzgerald endorsed anxiety related to yesterday's MRI findings that showed a lesion in right frontal cortex w/hemorrhage and edema; he has an appointment with Dr. Flonnie Hailstone scheduled for Tuesday morning.  Fortunately, he has no symptoms.  I offered support, psychoeducation, and recommendations to tolerate and address the understandable anxiety that comes with this news.  We agreed to meet again next week.     Risk Assessment:  A suicide and violence risk assessment was performed as part of this evaluation. There patient is deemed to be at chronic elevated risk for self-harm/suicide given the following factors: current diagnosis of depression, suicidal ideation or threats without a plan and past diagnosis of depression. There patient is deemed to be at chronic elevated risk for violence given the following factors: male gender. These risk factors are mitigated by the following factors: lack of active SI/HI. There is no acute risk for suicide or violence at this time. The patient was educated about relevant modifiable risk factors including following recommendations for treatment of psychiatric illness and abstaining from substance abuse. While future psychiatric events cannot be accurately predicted, the patient does not currently require acute inpatient psychiatric care and does not currently meet [name] involuntary commitment criteria.              Plan:  1. Will continue to see Kenneth Fitzgerald for psychotherapy; agreed to meet next Thursday.  2. Kenneth Fitzgerald has re-established care with [name], NP.  3. Kenneth Fitzgerald has information on available [facility] resources and he has worked with [name], [name].  I have previously reached out to [name] to explore financial resources.  4. Kenneth Fitzgerald knows to reach me as needed.     Subjective:   Kenneth Fitzgerald presented as alert, oriented, and engaged. His affect was more flat than usual.    Kenneth Fitzgerald shared that he received MRI results yesterday that  revealed a lesion in right frontal cortex w/hemorrhage and edema.  He described this news as surprising, especially because he has no symptoms.  Kenneth Fitzgerald has an appointment scheduled with Dr. Flonnie Hailstone for next Tuesday morning.  Kenneth Fitzgerald endorsed understandable anxiety, largely in the form of rumination, about this latest news and what it may mean for future care and his prognosis.     Kenneth Fitzgerald has re-established care with [name], NP and is taking Cymbalta.  He also continues to take Synthroid.     I provided Kenneth Fitzgerald support and understanding.  We discussed and processed his thoughts and feelings related to the latest news.  We problem-solved approaches to address and tolerate the understandable distress that he is experiencing and ways to manage over the next several days.  Kenneth Fitzgerald was engaged and receptive.     Will continue to follow, and we agreed to meet next week.     Mental Status Exam:  Appearance:     Appears stated age   Motor:    No abnormal movements   Speech/Language:     Normal rate, volume, tone, fluency   Mood:   Well, yesterday was not a great day   Affect:    Flat; cooperative; engaged   Thought process:    Logical, linear, clear, coherent, goal-directed   Thought content:      Denies SI/HI or thought of self-harm   Perceptual disturbances:      Behavior not concerning for response to internal stimuli      Orientation:    Oriented to person, place, time, and general circumstances   Attention:    Able to fully attend without fluctuations in consciousness   Concentration:    Able to fully concentrate and attend   Memory:    Immediate, short-term, long-term, and recall grossly intact    Fund of knowledge:     Consistent with level of education and development   Insight:      Intact   Judgment:     Intact   Impulse Control:    Intact      Diagnosis: Recurrent major depressive disorder, moderate;  Anxiety associated with cancer diagnosis     [name]  January 31, 2023

## 2023-02-01 NOTE — Unmapped (Signed)
Copied from CRM #[id number]. Topic: Nurse Triage - [facility] Contract Page  >> Feb 01, 2023  7:18 AM Valeda Malm, RN wrote:  Charge RN paged on-call provider, Fuller Canada MD, to discuss page and review SBAR.??   Provider advised RN linked Fuller Canada, hematology fellow, with caller [name].  Additional instructions: none  Consulting civil engineer returned call to patient/caregiver and reviewed provider's instructions as written above.?? Patient/caregiver verbalized understanding.  RN linked Fuller Canada, hematology fellow, with caller [name].

## 2023-02-01 NOTE — Unmapped (Signed)
Regarding: questions about symptoms to watch for - cancer pt with a brain bleed  ----- Message from Wade T sent at 02/01/2023  5:37 AM EST -----  Copied From CRM #[id number].  [facility] Connect Triage  If you had not called [facility] Connect, what do you think you would have done?   MyChart Message to Provider

## 2023-02-04 ENCOUNTER — Ambulatory Visit
Admit: 2023-02-04 | Discharge: 2023-02-14 | Payer: BLUE CROSS/BLUE SHIELD | Attending: Radiation Oncology | Primary: Radiation Oncology

## 2023-02-04 ENCOUNTER — Inpatient Hospital Stay
Admit: 2023-02-04 | Discharge: 2023-02-05 | Payer: BLUE CROSS/BLUE SHIELD | Attending: Radiation Oncology | Primary: Radiation Oncology

## 2023-02-04 NOTE — Unmapped (Signed)
02/04/2023      Subjective/Assessment/Recommendations:    UJ:WJXBJ Neck: 07/09/2013: 4,800/4,800 cGy  YN:WGNFA Brai: 01/20/2020: 3,000/3,000 cGy  Rx:2LFro+RCi: 05/07/2022: 2,000/2,000 cGy    1. Fatigue:  baseline  2. Chemo:  [name] and Braftovi  3. Pain: Mild and Not taking pain medication  4. Neurologic status: Headache and Cognition  5. Psychosocial: No issues    Wt Readings from Last 6 Encounters:   02/04/23 (!) 145.6 kg (321 lb)   01/30/23 (!) 146.1 kg (322 lb)   12/30/22 (!) 143.8 kg (317 lb 1.6 oz)   11/14/22 (!) 144.1 kg (317 lb 9.6 oz)   10/30/22 (!) 145.7 kg (321 lb 3.2 oz)   10/07/22 (!) (P) 143.3 kg (316 lb)     Vitals:    02/04/23 0955   Temp: 36.2 ??C (97.2 ??F)   TempSrc: Temporal   Weight: (!) 145.6 kg (321 lb)       Pain & Constipation  Pain Level:: 0-10  0-10 Pain Scale: 4  Pain Frequency 2: Constant/continuous  Pain Location: Head  Problems with Constipation?: No

## 2023-02-04 NOTE — Unmapped (Signed)
Radiation Oncology Follow Up Visit    Patient: Kenneth Fitzgerald [facility]  VISIT DATE: 02/04/2023    Diagnoses:  1. Adhesive capsulitis of right shoulder    2. 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 He underwent resection of Stage III scalp melanoma in 2012 (pT2N2) followed by R neck dissection with 1/20LN+ in 2015 s/p EBRT (48Gy). Developed brain metastases in 2020, s/p WBRT 01/20/2020 then SRS to 3 lesions 05/07/2022. He now demonstrates hemorrhage of disease in the R frontal lobe.      MRI 01/30/2023 demonstrates hemorrhage of disease in the R frontal lobe. On our review, it appears that this hemorrhage may have originated from one of the lesions treated with SRS 9 months ago (R cingulate gyrus lesion, see images below).  It is unclear if hemorrhage represents disease progression vs. Post-RT + BRAF/MEK inhibitor therapy (we are favoring the latter but will monitor closely in case progression).     Planning to manage conservatively with slow steroid taper and close surveillance. No additional XRT at this time. Discussed care with Dr. Nedra Hai and Aldean Jewett     Plan:     -Disease Status: No clear progression, although will monitor hemorrhagic lesion closely     -Care Plan: systemic therapy Braftovi (holding MEK inhibitor in setting of hemorrhage) per Dr Nedra Hai   -Steroids: Continue dexamethasone 4mg  daily for 7 more days, then 14 days of dexamethasone 2mg  then stop. Patient or family will call our team to increase steroid if symptoms worsen with taper.     -Follow-up: 2 months with MRI brain     INTERVAL HISTORY:    See MRI below that demonstrates hemorrhage of R frontal lesion c/w with hemorrhagic transformation of previously irradiated R cingulate gyrus lesion. He was started on steroids by Dr. Nedra Hai but was NOT symptomatic at initial evaluation with her team. Since that time he has noticed 4/10 severity headache of bifrontal distribution. He also notes slight worsening of keeping track of time. Otherwise no loss of coordination, no weakness, denies nausea, change in vision, change in mood/behavior, change in memory.     Discussed his history working with [employer] which includes climbing trips to [place] and [employer].       R frontal lesion with hemorrhage:    Previously treated R cingulate gyrus lesion 20Gyx1       REVIEW OF SYSTEMS:  A comprehensive review of 10 systems was negative except for pertinent positives noted in HPI.      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)    Malignant neoplasm of thyroid gland (CMS-HCC)    Postoperative hypothyroidism    Hypocalcemia    Thyroid cancer (CMS-HCC)    Primary melanoma of head and neck (CMS-HCC)    Ventral hernia with bowel obstruction    Insomnia    Crohn's disease of large intestine without complication (CMS-HCC)    Metastasis  to brain (CMS-HCC)    Gout    01/24/2020: Open, primary ventral hernia repair for SBO    Morbid obesity with BMI of 40.0-44.9, adult (CMS-HCC)    Nontoxic uninodular goiter    Major depressive disorder in remission (CMS-HCC)    Lumbosacral spondylosis without myelopathy    Diverticulitis of colon (without mention of hemorrhage)(562.11)    Depressive disorder    Diverticulosis of colon    Iron deficiency anemia    Suicidal ideation    Atherosclerotic cardiovascular disease    HLD (hyperlipidemia)    Anxiety associated with cancer diagnosis (CMS-HCC)    Recurrent major depressive disorder in partial remission (CMS-HCC)    Immunization declined       PAST MEDICAL HISTORY/FAMILY HISTORY/SOCIAL HISTORY:  Reviewed in EPIC    ALLERGIES/MEDICATIONS:  Reviewed in EPIC      OBJECTIVE:  Karnofsky/Lansky Performance Status: 90,  Able to carry on normal activity; minor signs or symptoms of disease (ECOG equivalent 0)  Vitals:    02/04/23 0955   Temp: 36.2 ??C (97.2 ??F)     General: No acute distress, alert and oriented  Pulm: No increased work of breathing, speaking in full sentences  Neuro:  Speech clear/fluent, no expressive aphasia, comprehension full. Gate wnl, CN III-XII intact and symmetric bilaterally, strength grossly 5/5 throughout. A&Ox4  Psych: Normal mood and affect  Skin: No visible abnormalities      Elita Quick, MD, MS  Radiation Oncology PGY-5          ATTENDING ATTESTATION:    I saw and evaluated/examined the patient, participating in the key portions of the service.  I discussed the findings, assessment, and plan of care with the resident.  I have edited and agree with the findings and plan as documented in the resident's note.      I personally reviewed all relevant data associated with this encounter including imaging, labwork, and prior records.  I have also discussed this patient's case with Dr. Nedra Hai.      This encounter was related to my continuous and active collaborative plan of care related to this patient's serious (or complex) condition.     Mina Marble, MD, PhD  Assistant Professor  Department of Radiation Oncology  [facility]  [address], [id number]  [place], [place] [address]  O: [phone]  P: [phone]

## 2023-02-04 NOTE — Unmapped (Signed)
INFORMED CONSENT DOCUMENTATION    Visit Date: 02/04/23    Study IRB #: [id number]     [facility] #: [id number]    Patient Name: [name]    MRN: [id number]    DOB: Apr 22, 1975    Dr. Flonnie Hailstone and I met with the patient,Kenneth Fitzgerald, to discuss consent for [facility] 2303: [facility] Metastatic Cancer Radiation Therapy Registry. The consent form was reviewed, including discussion of risks & benefits, procedures, confidentiality, time commitments involved, study contact list, and the option to withdraw at any time. Alternatives to study participation were discussed.     The patient had ample time to consider participation in the study, in the absence of coercion or undue influence. Patient was offered an opportunity to ask questions and these questions were answered to their satisfaction. Patient verbalized understanding of information presented. The patient has signed and dated the following documents in my presence: HIPAA Authorization Form version dated 03/06/22 and informed consent document version dated 03/20/22. Patient agreed to unencrypted communication in order to complete future questionnaires.    Signed and dated copies of all consent documents were given to the patient. Every effort to maintain confidentiality will be employed.    No procedures specifically related to the research were performed prior to informed consent being signed.      After the patient consented and eligibility criteria was signed, the patient was registered in [employer] [facility] and completed their baseline questionnaire, which included bone mets/brain mets, on the iPad. They were also registered in [place].               ATTENDING ATTESTATION:    I also discussed the research study with the patient, reviewing benefits and risks and that participation is entirely optional.  I agree with the documentation of informed consent in the research coordinator's note.      Mina Marble, MD, PhD  Assistant Professor  Department of Radiation Oncology  [facility]  [address]  [place], [place] [address]  O: [phone]  P: [phone]

## 2023-02-06 ENCOUNTER — Ambulatory Visit: Admit: 2023-02-06 | Payer: BLUE CROSS/BLUE SHIELD | Attending: Clinical | Primary: Clinical

## 2023-02-06 NOTE — Unmapped (Signed)
The [facility] has made a second and final attempt to reach this patient to refill the following medication:Braftovi 75mg  and [name] 15mg .      We have left voicemails on the following phone numbers: [phone] and have sent a text message to the following phone numbers: [phone] .    Dates contacted: 01/16, 01/23  Last scheduled delivery: 01/02/23    The patient may be at risk of non-compliance with this medication. The patient should call the [facility] at [phone]  Option 4, then Option 1: Oncology to refill medication.    [facility]

## 2023-02-12 ENCOUNTER — Emergency Department
Admit: 2023-02-12 | Discharge: 2023-02-12 | Disposition: A | Discharge status: Home / Self Care | Payer: BLUE CROSS/BLUE SHIELD

## 2023-02-12 DIAGNOSIS — I629 Nontraumatic intracranial hemorrhage, unspecified: Principal | ICD-10-CM

## 2023-02-12 DIAGNOSIS — C439 Malignant melanoma of skin, unspecified: Principal | ICD-10-CM

## 2023-02-12 LAB — CBC W/ AUTO DIFF
BASOPHILS ABSOLUTE COUNT: 0.1 10*9/L (ref 0.0–0.1)
BASOPHILS RELATIVE PERCENT: 1.3 %
EOSINOPHILS ABSOLUTE COUNT: 0.2 10*9/L (ref 0.0–0.5)
EOSINOPHILS RELATIVE PERCENT: 1.9 %
HEMATOCRIT: 43.5 % (ref 39.0–48.0)
HEMOGLOBIN: 14.9 g/dL (ref 12.9–16.5)
LYMPHOCYTES ABSOLUTE COUNT: 1.3 10*9/L (ref 1.1–3.6)
LYMPHOCYTES RELATIVE PERCENT: 13.5 %
MEAN CORPUSCULAR HEMOGLOBIN CONC: 34.2 g/dL (ref 32.0–36.0)
MEAN CORPUSCULAR HEMOGLOBIN: 29.5 pg (ref 25.9–32.4)
MEAN CORPUSCULAR VOLUME: 86.2 fL (ref 77.6–95.7)
MEAN PLATELET VOLUME: 8.2 fL (ref 6.8–10.7)
MONOCYTES ABSOLUTE COUNT: 0.7 10*9/L (ref 0.3–0.8)
MONOCYTES RELATIVE PERCENT: 7.5 %
NEUTROPHILS ABSOLUTE COUNT: 7.1 10*9/L (ref 1.8–7.8)
NEUTROPHILS RELATIVE PERCENT: 75.8 %
PLATELET COUNT: 217 10*9/L (ref 150–450)
RED BLOOD CELL COUNT: 5.05 10*12/L (ref 4.26–5.60)
RED CELL DISTRIBUTION WIDTH: 16.2 % — ABNORMAL HIGH (ref 12.2–15.2)
WBC ADJUSTED: 9.3 10*9/L (ref 3.6–11.2)

## 2023-02-12 LAB — BASIC METABOLIC PANEL
ANION GAP: 13 mmol/L (ref 5–14)
BLOOD UREA NITROGEN: 18 mg/dL (ref 9–23)
BUN / CREAT RATIO: 17
CALCIUM: 9.4 mg/dL (ref 8.7–10.4)
CHLORIDE: 99 mmol/L (ref 98–107)
CO2: 25 mmol/L (ref 20.0–31.0)
CREATININE: 1.06 mg/dL (ref 0.73–1.18)
EGFR CKD-EPI (2021) MALE: 87 mL/min/{1.73_m2} (ref >=60)
GLUCOSE RANDOM: 104 mg/dL (ref 70–179)
POTASSIUM: 4.2 mmol/L (ref 3.4–4.8)
SODIUM: 137 mmol/L (ref 135–145)

## 2023-02-12 LAB — APTT
APTT: 29.5 s (ref 24.8–38.4)
HEPARIN CORRELATION: 0.2

## 2023-02-12 LAB — PROTIME-INR
INR: 1.03
PROTIME: 11.7 s (ref 9.9–12.6)

## 2023-02-12 LAB — GREEN LITHIUM HEPARIN EXTRA TUBE

## 2023-02-12 MED ORDER — DEXAMETHASONE 2 MG TABLET
ORAL_TABLET | ORAL | 0 refills | 10.00 days | Status: CP
Start: 2023-02-12 — End: 2023-02-22

## 2023-02-12 MED ADMIN — acetaminophen (TYLENOL) tablet 650 mg: 650 mg | ORAL | @ 19:00:00 | Stop: 2023-02-12

## 2023-02-12 MED ADMIN — ondansetron (ZOFRAN) injection 4 mg: 4 mg | INTRAVENOUS | @ 19:00:00 | Stop: 2023-02-12

## 2023-02-12 NOTE — Unmapped (Signed)
Please schedule this patient for an appointment as noted below.  Please contact the patient as needed/appropriate to alert them of this appointment.    Provider: Salli Quarry, MD     Date or Time Frame: Tuesday 2/4    Reason for Visit: follow up after ED visit    Imaging Needed: none    Reason for Imaging: none

## 2023-02-12 NOTE — Unmapped (Signed)
[facility] Other Call ONC Phone Room Smart Lists: Urgent symptoms/symptom management -     Hi,     [name] has contacted the Communication Center on behalf of [facility] in regards to the following symptom:     Nausea and vomiting    Caller reports that patient's treatment status is not currently in active treatment.    Please contact [name] at [phone].    Symptoms deemed to be Urgent are marked High Priority    Thank you,  Roseanne Kaufman   [facility] Communication Center   [phone]    [facility]

## 2023-02-12 NOTE — Unmapped (Signed)
Presenting with worsening headache and vomiting that started today. Patient has PMH of melanoma and had an MRI done 1/16 which reads:      Interval enlargement of a right frontal cortical mass due to intratumoral hemorrhage with new surrounding vasogenic edema resulting in mass effect, partial effacement of the right lateral ventricle and 0.8 cm leftward midline shift. No evidence of cerebral herniation.  Similar size and number of numerous additional metastatic lesions throughout the supratentorial and infratentorial brain.    He was then started on steroids but today symptoms got worse

## 2023-02-12 NOTE — Unmapped (Signed)
Bed: 18-B  Expected date:   Expected time:   Means of arrival:   Comments:

## 2023-02-12 NOTE — Unmapped (Signed)
[facility] Other Call ONC Phone Room Smart Lists: General -     Hi,     [name] contacted the Communication Center regarding the following:    - patient is at the [facility] ED, waiting to be seen    Please contact [name] at [phone] .    Thanks in advance,    Yehuda Mao  [facility] Communication Center   [phone]    [facility]

## 2023-02-12 NOTE — Unmapped (Signed)
[facility]  Emergency Department Provider Note     ED Clinical Impression     Final diagnoses:   Acute intra-cranial hemorrhage (CMS-HCC) (Primary)      Impression, Medical Decision Making, ED Course     Impression: 48 y.o. 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 who presents with nausea/vomiting and headache in setting of known brain bleed as described below.     History of recurrent thyroid cancer treated with RAI and melanoma for which she underwent resection of stage III scalp melanoma in 2012 followed by right neck dissection in 2015.  Patient developed brain metastasis in 2020 status post whole brain radiation treatment in 2022 then SRS to 3 lesions in April 2024 found to have hemorrhage of disease in the right frontal lobe.  On note from heme-onc on 02/04/2023 plan was to manage conservatively with slow steroid taper and close surveillance, patient started steroid course on 01/30/23.  Patient with repeat CT head performed prior to emergency department evaluation, final read with decrease in size of right frontal lesion, small scattered hyperattenuating lesions likely reflecting additional metastatic deposits and persistent asymmetric dilation of the right lateral ventricle with stable leftward midline shift.    In patient with nausea/vomiting and headache with known metastatic disease to the brain as well as hemorrhage, this is likely cause of patient's symptoms.  Patient without fever, cough, diarrhea.  However in setting of patient on steroids and with nausea/vomiting will check RPP.  Basic labs obtained including coags, CBC, BMP. Patient without fever and overall appears well, do not suspect meningitis/encephalitis.       Orders Placed This Encounter   Procedures    Respiratory Pathogen Panel with COVID    CT head WO contrast    ED Extra Tubes    aPTT    Basic Metabolic Panel    CBC w/ Differential    PT-INR    ECG 12 Lead    Type and Screen with Confirmation ABORh    Insert peripheral IV    ED Admit Decision       ED Course as of 02/12/23 2138   Wed Feb 12, 2023   1339 With nausea and vomiting.  On EKG from   1342 CT head demonstrating:  -The hyperattenuating component of the right frontal lesion has decreased in size compared to prior, however, the lesion is overall not well evaluated on CT. MRI would be beneficial for definitive evaluation.     - Additional small scattered hyperattenuating lesions are seen, which likely reflect additional metastatic deposits.     - Persistent asymmetric dilation of the right lateral ventricle. Stable leftward midline shift.     1343 Spoke with neurosurgery, Dr. Sullivan Lone, to evaluated patient.  Based on CT obtained today there is improvement in patient's left frontal hemorrhage compared to prior MRIs.  Radiation oncology will come to evaluate patient.  Will treat symptoms at present.  EKG obtained today with QTc 469.  Zofran given   1351 Dr. Sullivan Lone recommending Decadron taper 4 mg every 6 hours for 4 days, 4 mg twice daily for 2 days, 4 mg daily for 2 days and 2 mg daily for 2 days, will prescribe for discharge     1643 Dr. Flonnie Hailstone evaluated patient at bedside, agrees  with plan to discharge patient home with additional steroid taper and patient to have outpatient clinic follow-up   1648 Provided return precautions, has Zofran available at home.  Prescription for Decadron taper sent to patient's pharmacy. Dr. Flonnie Hailstone calling nurse navigator to arrange follow up appointment for patient and has repeat MRI scheduled for March. Patient discharged home       MDM Elements  Discussion of Management with other Physicians, QHP or Appropriate Source: Consultant - neurosurgery    I have reviewed recent and relevant previous record, including: Outpatient notes - radiation oncology note from 02/04/23 documenting patient with MRI demonstrating hemorrage at site of   Escalation of Care including OBS/Admission/Transfer was considered: However, patient was determined to be appropriate for outpatient management.        ____________________________________________    The case was discussed with the attending physician, who is in agreement with the above assessment and plan.      History     Chief Complaint  Chief Complaint   Patient presents with    Medical Problem       HPI   Kenneth Fitzgerald is a 48 y.o. male with past medical history as below who presents with  nausea/vomiting and headache in setting of known brain bleed as described below.     Patient presenting with nausea and vomiting beginning today as well as headache in setting of known brain bleed.  He reports that on 01/30/2023 he had imaging with oncology that demonstrated brain bleed and he was started on steroids.  At that time he was asymptomatic.  He reports that over the past week he has developed gradual headache now 6-8 out of 10 for which she has been taking Tylenol and continued his steroid at home.  He was due to taper down on the steroid today but took a second dose for a total of 4 mg.  He reports vomiting beginning this morning with 1 episode prior to arrival and second episode while in the emergency department.  He denies fever, cough, congestion, abdominal pain, diarrhea.  No known sick contacts.  He reports that he has been having more difficulty keeping track of time and has showed up early for work in late for church thinking that it was a different time than it actually was.  He is otherwise not been noticed to have any change in his personality or decision making.  He denies any new weakness or numbness, no vision changes, no change in taste.  He has not had any falls and no recent head trauma.  He denies any blood thinners, aspirin use.  He took Zofran and tylenol this morning around 9 AM.     Outside Historian(s): I have obtained additional history/collateral from Patient's sister.    Past Medical History:   Diagnosis Date    Ankylosing spondylitis (CMS-HCC)     Atherosclerotic cardiovascular disease 11/08/2021    Cancer (CMS-HCC)     melanoma, thyroid cancer    Cognitive impairment     since brain radiation he has noted some    Crohn's disease (CMS-HCC)     Depressive disorder 04/13/2015    Disease of thyroid gland     hypothyroid    Gout     Hearing impairment     hearing loss in both ears    HL (hearing loss)     HLD (hyperlipidemia) 11/08/2021    Hypothyroidism     Iron deficiency anemia 11/06/2021    Skin cancer  Past Surgical History:   Procedure Laterality Date    PR COLONOSCOPY FLX DX W/COLLJ SPEC WHEN PFRMD N/A 08/29/2021    Procedure: COLONOSCOPY, FLEXIBLE, PROXIMAL TO SPLENIC FLEXURE; DIAGNOSTIC, W/WO COLLECTION SPECIMEN BY BRUSH OR WASH;  Surgeon: Luanne Bras, MD;  Location: [facility];  Service: Gastroenterology    PR COLONOSCOPY W/BIOPSY SINGLE/MULTIPLE N/A 01/09/2018    Procedure: COLONOSCOPY, FLEXIBLE, PROXIMAL TO SPLENIC FLEXURE; WITH BIOPSY, SINGLE OR MULTIPLE;  Surgeon: Vonda Antigua, MD;  [facility];  Service: Gastroenterology    PR COLONOSCOPY W/BIOPSY SINGLE/MULTIPLE N/A 08/24/2019    Procedure: COLONOSCOPY, FLEXIBLE, PROXIMAL TO SPLENIC FLEXURE; WITH BIOPSY, SINGLE OR MULTIPLE;  Surgeon: Leland Her, MD;  Location: [facility] PROCEDURES [facility];  Service: Gastroenterology    PR COLONOSCOPY W/BIOPSY SINGLE/MULTIPLE N/A 08/29/2021    Procedure: COLONOSCOPY, FLEXIBLE, PROXIMAL TO SPLENIC FLEXURE; WITH BIOPSY, SINGLE OR MULTIPLE;  Surgeon: Luanne Bras, MD;  Location: [facility]ED[facility];  Service: Gastroenterology    PR COLSC FLX WITH DIRECTED SUBMUCOSAL NJX ANY SBST N/A 08/24/2019    Procedure: COLONOSCOPY, FLEXIBLE, PROXIMAL TO SPLENIC FLEXURE; WITH DIRECTED SUBMUCOSAL INJECTION(S), ANY SUBSTANCE;  Surgeon: Leland Her, MD;  Location: [facility] PROCEDURES [facility];  Service: Gastroenterology    PR EXPLORATORY OF ABDOMEN N/A 01/24/2020    Procedure: EXPLORATORY LAPAROTOMY, EXPLORATORY CELIOTOMY WITH OR WITHOUT BIOPSY(S);  Surgeon: [name], MD;  Location: MAIN OR [place];  Service: Trauma    PR FREEING BOWEL ADHESION,ENTEROLYSIS N/A 01/24/2020    Procedure: ENTEROLYSIS (SEPART PROC);  Surgeon: [name], MD;  Location: MAIN OR [facility];  Service: Trauma    PR IMPLANT MESH HERNIA REPAIR/DEBRIDEMENT CLOSURE N/A 01/03/2015    Procedure: IMPLANTATION OF MESH/OTHER PROSTHES INCISION/VENTRAL HERNIA REPAIR/MESH CLOSE DEBRID NECROT SOFT TIS INFECT;  Surgeon: Romero Belling, MD;  Location: MAIN OR [place];  Service: Gastrointestinal    PR LAP, VENTRAL HERNIA REPAIR,REDUCIBLE N/A 04/07/2014    Procedure: LAPAROSCOPY, SURGICAL, REPAIR, VENTRAL, UMBILICAL, SPIGELIAN OR EPIGASTRIC HERNIA, REDUCIBLE;  Surgeon: Romero Belling, MD;  Location: MAIN OR [place];  Service: Gastrointestinal    PR NEGATIVE PRESSURE WOUND THERAPY DME >50 SQ CM N/A 01/24/2020    Procedure: NEG PRESS WOUND TX (VAC ASSIST) INCL TOPICALS, PER SESSION, TSA GREATER THAN/= 50 CM SQUARED;  Surgeon: [name], MD;  Location: MAIN OR [place];  Service: Trauma    PR REMOVAL NODES, NECK,CERV CMPLT Left 12/07/2012    Procedure: CERVICAL LYMPHADENECTOMY (COMPLETE);  Surgeon: Charlott Rakes, MD;  Location: MAIN OR [facility];  Service: Surgical Oncology    PR REMOVAL NODES, NECK,CERV MOD RAD Right 03/29/2013    Procedure: CERVICAL LYMPHADENECTOMY (MODIFIED RADICAL NECK DISSECTION);  Surgeon: Charlott Rakes, MD;  Location: MAIN OR [facility];  Service: Surgical Oncology    PR REPAIR RECURR INCIS [facility] N/A 01/03/2015    Procedure: REPAIR RECURRENT INCISIONAL OR VENTRAL HERNIA; REDUCIBLE;  Surgeon: Romero Belling, MD; Location: MAIN OR [facility];  Service: Gastrointestinal    PR REPAIR RECURR INCIS HERNIA,STRANG N/A 06/21/2016    Procedure: REPAIR RECURRENT INCISIONAL OR VENTRAL HERNIA; INCARCERATED OR STRANGULATED;  Surgeon: Mickle Asper, MD;  Location: MAIN OR [facility];  Service: Gastrointestinal    PR REPAIR RECURR INCIS HERNIA,STRANG N/A 01/24/2020    Procedure: REPAIR RECURRENT INCISIONAL OR VENTRAL HERNIA; INCARCERATED OR STRANGULATED;  Surgeon: [name], MD;  Location: MAIN OR ;  Service: Trauma    PR SIGMOIDOSCOPY,FINE NEEDL BX,US GUIDED N/A 01/09/2018    Procedure: SIGMOIDOSCOPY, FLEXIBLE,  W/TRANSENDOSCOPIC ULTRASOUND GUIDED NEEDLE ASPIRATION;  Surgeon: Vonda Antigua, MD;  [facility]: [facility];  Service: Gastroenterology       No current facility-administered medications for this encounter.    Current Outpatient Medications:     acetaminophen (TYLENOL) 500 MG tablet, Take 2 tablets (1,000 mg total) by mouth every eight (8) hours as needed for pain., Disp: 30 tablet, Rfl: 0    binimetinib ([name]) 15 mg tablet, Take 3 tablets (45 mg total) by mouth two (2) times a day., Disp: 180 tablet, Rfl: 11    blood sugar diagnostic Strp, Dispense 100 blood glucose test strips, ok to sub any brand preferred by insurance/patient, use 3x/day; dispense whatever brand matches with meter., Disp: 100 strip, Rfl: 12    blood-glucose meter kit, Use as instructed; dispense 1 meter, whatever is preferred by insurance, Disp: 1 each, Rfl: 1    calcitriol (ROCALTROL) 0.25 MCG capsule, Take 1 capsule (0.25 mcg total) by mouth daily., Disp: 30 capsule, Rfl: 0    calcium carbonate 650 mg calcium (1,625 mg) tablet, Take 1 tablet (650 mg elem calcium total) by mouth Three (3) times a day with a meal., Disp: , Rfl:     dexAMETHasone (DECADRON) 2 MG tablet, Take 2 tablets (4 mg total) by mouth every six (6) hours for 4 days, THEN 2 tablets (4 mg total) two (2) times a day for 2 days, THEN 2 tablets (4 mg total) daily for 2 days, THEN 1 tablet (2 mg total) daily for 2 days., Disp: 46 tablet, Rfl: 0    encorafenib (BRAFTOVI) 75 mg capsule, Take 6 capsules (450 mg total) by mouth daily., Disp: 180 capsule, Rfl: 11    ezetimibe (ZETIA) 10 mg tablet, Take 1 tablet (10 mg total) by mouth daily., Disp: 90 tablet, Rfl: 3    fexofenadine (ALLEGRA ALLERGY) 180 MG tablet, Take 1 tablet (180 mg total) by mouth daily as needed (allergies)., Disp: , Rfl:     fluticasone propionate (FLONASE) 50 mcg/actuation nasal spray, 1 spray into each nostril daily., Disp: , Rfl:     folic acid (FOLVITE) 1 MG tablet, Take 1 tablet (1,000 mcg total) by mouth daily., Disp: 90 tablet, Rfl: 3    ibuprofen (ADVIL,MOTRIN) 200 MG tablet, Take 3 tablets (600 mg total) by mouth daily as needed for pain., Disp: , Rfl:     lancets Misc, Dispense 100 lancets, ok to sub any brand preferred by insurance/patient, use 3x/day, Disp: 100 each, Rfl: 12    levothyroxine (SYNTHROID) 125 MCG tablet, Take 3 tablets (375 mcg total) by mouth daily., Disp: 270 tablet, Rfl: 3    ofloxacin (OCUFLOX) 0.3 % ophthalmic solution, Instill 4 drops in left ear twice daily for 5 days, Disp: 5 mL, Rfl: 3    ondansetron (ZOFRAN) 8 MG tablet, Take 1 tablet (8 mg total) by mouth Take as directed., Disp: , Rfl:     sulfaSALAzine (AZULFIDINE) 500 mg tablet, Take 2 tablets (1,000 mg total) by mouth two (2) times a day., Disp: 360 tablet, Rfl: 3    Allergies  Compazine [prochlorperazine] and Coconut    Family History  Family History   Problem Relation Age of Onset    Hyperthyroidism Mother     Osteoporosis Mother     Arrhythmia Mother     Squamous cell carcinoma Mother         basal cell vs squamous cell skin cancer    Coronary artery disease Father  s/p CABG    Diabetes Father     Hypertension Father     Prostate cancer Father     Colon cancer Paternal Grandmother     Thyroid disease Neg Hx        Social History  Social History     Tobacco Use    Smoking status: Never     Passive exposure: Never    Smokeless tobacco: Never   Vaping Use    Vaping status: Never Used   Substance Use Topics    Alcohol use: Yes     Comment: social drinker 3 times yearly    Drug use: No        Physical Exam     VITAL SIGNS:      Vitals:    02/12/23 1400 02/12/23 1502 02/12/23 1532 02/12/23 1600   BP: 155/90 134/72  142/112   Pulse: 63 61 61 65   Resp: 14 13 14 15    Temp:    36.1 ??C (97 ??F)   TempSrc:    Oral   SpO2: 97% 99% 100% 100%   Weight:           Constitutional: Alert and oriented. No acute distress.  Eyes: Conjunctivae are normal. EOMI. PERRL  HEENT: Normocephalic and atraumatic. Conjunctivae clear. No congestion. Moist mucous membranes.   Cardiovascular: Rate as above, regular rhythm. Normal and symmetric distal pulses. Brisk capillary refill. Normal skin turgor.  Respiratory: Normal respiratory effort. Breath sounds are normal. There are no wheezing or crackles heard.  Gastrointestinal: Soft, non-distended, non-tender.  Genitourinary: Deferred.  Musculoskeletal: Non-tender with normal range of motion in all extremities.  Neurologic: Annual nerves II through follow-up intact.  Normal speech and language.  5 out of 5 strength bilateral upper and lower extremities.  Sensory light touch symmetric and intact bilateral upper and lower extremities.  Finger-nose intact bilateral.  No dysdiadochokinesia.  Negative Romberg.  Gait intact.  Tandem gait intact.    Skin: Skin is warm, dry and intact. No rash noted.  Psychiatric: Mood and affect are normal. Speech and behavior are normal.     Radiology     CT head WO contrast   Final Result   -The hyperattenuating component of the right frontal lesion has decreased in size compared to prior, however, the lesion is overall not well evaluated on CT. MRI would be beneficial for definitive evaluation.      - Additional small scattered hyperattenuating lesions are seen, which likely reflect additional metastatic deposits.      - Persistent asymmetric dilation of the right lateral ventricle. Stable leftward midline shift.                         Pertinent labs & imaging results that were available during my care of the patient were independently interpreted by me and considered in my medical decision making (see chart for details).    Portions of this record have been created using Scientist, clinical (histocompatibility and immunogenetics). Dictation errors have been sought, but may not have been identified and corrected.         Doree Albee, MD  Resident  02/12/23 [phone]

## 2023-02-12 NOTE — Unmapped (Signed)
Pt coming in POV from home after receiving a call from oncology concerned for a possible brain bleed.

## 2023-02-12 NOTE — Unmapped (Signed)
NEUROSURGERY CONSULT NOTE      Requesting Attending Physician:  Emiliano Dyer, DO  Service Requesting Consult:  Emergency Medicine    Assessment and Recommendations  Kenneth Fitzgerald is a 48 y.o. 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 MRI brain 1/16 personally reviewed which demonstrates small right cingulate mass with a large acute hemorrhage within and lateral to the tumor, small amount of midline shift and cerebral edema; there are also a few bilateral supra and infratentorial lesions which are stable in size. [facility] 1/29 personally reviewed which demonstrates decreased blood products when compared to recent MRI and similar mass effect/cerebral edema. The ventricles appear asymmetric, R>L, however with improving mass effect from the blood products expect this to continue improving. Neurological exam is reassuring at this time and nonfocal.    - No neurosurgical intervention indicated at this time given improving blood products and small size of the tumor itself. Overall no signs of acute decompensation or severe hydrocephalus. Recommend increasing decadron to 4 mg q6h until Monday then tapering (4mg  BID x2 days, 2mg  BID x2 days, 2mg  daily x2 days, off).      Patient was discussed with Lorretta Harp, MD and staffed with Salli Quarry, MD.   For questions/concerns regarding patients:  Mon-Fri 6 AM-6 PM, please page Cranial Neurosurgery Service at [phone] (ICU) OR [phone] (Floor/ISCU)  Sun-Thurs 6 PM-6 AM AND weekends/holidays, please page Neurosurgery Night Float/ On-Call Service       Problem List  Active Problems:    * No active hospital problems. *      History of Present Illness  Kenneth Fitzgerald is a 48 y.o. right handed male seen in consultation at the request of Smeet Achille Rich, DO for evaluation of brain bleed.  Patient was recently seen in oncology clinic 1/16 with an MRI brain demonstrating small amount of hemorrhage in the right frontal tumor.  At that time he was asymptomatic and they increased his Decadron.  He did not notice a difference in his symptoms when the Decadron was started but about 1 day ago he started having a moderate headache which is unusual for him.  He also had an episode of vomiting in the morning which seems different from his intermittent vomiting that he has had for many years.  For many years he has vomiting about 1-2 times per week, usually in the mornings, but this felt like a larger amount and unprovoked from food.  No issues with balance, coordination, ambulation, weakness, or numbness.  No vision changes.  He states that yesterday he had 1 episode where he was confused to where he was but overall he has been fine from a mental status perspective according to the sister.  No speech issues.  No lethargy.  Has been holding his [name] for radiation oncology but has been taking his Braftovi.  No aspirin or blood thinners. Coags and platelets normal today.     Hematology/Oncology History   Primary melanoma of head and neck (CMS-HCC)   03/17/2013 Initial Diagnosis    Melanoma (CMS-HCC)     02/12/2018 - 03/12/2018 Chemotherapy    OP NIVOLUMAB 480 MG Q4W  nivolumab 480 mg every 28 days     05/22/2021 -  Cancer Staged    Staging form: Melanoma of the Skin, AJCC 7th Edition  - Clinical: Stage IV (M1c) - Signed by [name], MD  on 05/22/2021       Metastasis to brain (CMS-HCC)   12/22/2019 Initial Diagnosis    Malignant neoplasm of brain, unspecified location (CMS-HCC)     12/22/2019 - 03/02/2022 Radiation    Radiation Therapy Treatment Details (12/22/2019 - 03/02/2022)  Site: Brain  Technique: IMRT  Goal: No goal specified  Planned Treatment Start Date: No planned start date specified     04/29/2022 -  Radiation    Radiation Therapy Treatment Details (Noted on 04/29/2022)  Site: Not Applicable Brain  Technique: SRS  Goal: No goal specified  Planned Treatment Start Date: No planned start date specified         Review of Systems  A 10-system review of systems was conducted and was negative except as documented above in the HPI.  ______________________________________________________________________    Physical Exam  General: No acute distress; Body mass index is 41.1 kg/m??.    Neurological Exam:  EOSp  AOX3  PERRL  EOMI  FS  TML  5/5 x4  SILT  No pronator drift    Cardiovascular: Warm and well perfused with good pulses, no edema  Pulmonary: Unlabored breathing, no pursed lips or wheezing, acyanotic  Abdomen: Soft, nontender, nondistended    Neurological imaging reviewed  [facility] 1/29 and MRI brain 1/16 were personally reviewed per above    The patient's available vitals, intake/output, medications, labs, and relevant neurological imaging were independently reviewed.    ---Historical data---    Allergies  Compazine [prochlorperazine] and Coconut    Medications  Reviewed in Epic.  Medications relevant to this consult listed below.    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    Cancer (CMS-HCC)     melanoma, thyroid cancer    Cognitive impairment     since brain radiation he has noted some    Crohn's disease (CMS-HCC)     Depressive disorder 04/13/2015    Disease of thyroid gland     hypothyroid    Gout     Hearing impairment     hearing loss in both ears    HL (hearing loss)     HLD (hyperlipidemia) 11/08/2021    Hypothyroidism     Iron deficiency anemia 11/06/2021    Skin cancer        Past Surgical History  Past Surgical History:   Procedure Laterality Date    PR COLONOSCOPY FLX DX W/COLLJ SPEC WHEN PFRMD N/A 08/29/2021    Procedure: COLONOSCOPY, FLEXIBLE, PROXIMAL TO SPLENIC FLEXURE; DIAGNOSTIC, W/WO COLLECTION SPECIMEN BY BRUSH OR WASH;  Surgeon: Luanne Bras, MD;  [facility];  Service: Gastroenterology    PR COLONOSCOPY W/BIOPSY SINGLE/MULTIPLE N/A 01/09/2018    Procedure: COLONOSCOPY, FLEXIBLE, PROXIMAL TO SPLENIC FLEXURE; WITH BIOPSY, SINGLE OR MULTIPLE;  Surgeon: Vonda Antigua, MD;  [facility];  Service: Gastroenterology    PR COLONOSCOPY W/BIOPSY SINGLE/MULTIPLE N/A 08/24/2019    Procedure: COLONOSCOPY, FLEXIBLE, PROXIMAL TO SPLENIC FLEXURE; WITH BIOPSY, SINGLE OR MULTIPLE;  Surgeon: Leland Her, MD;  Location: [facility]ED[facility];  Service: Gastroenterology    PR COLONOSCOPY W/BIOPSY SINGLE/MULTIPLE N/A 08/29/2021    Procedure: COLONOSCOPY, FLEXIBLE, PROXIMAL TO SPLENIC FLEXURE; WITH BIOPSY, SINGLE OR MULTIPLE;  Surgeon: Luanne Bras, MD;  Location: [facility];  Service: Gastroenterology    PR COLSC FLX WITH DIRECTED SUBMUCOSAL NJX ANY SBST N/A  08/24/2019    Procedure: COLONOSCOPY, FLEXIBLE, PROXIMAL TO SPLENIC FLEXURE; WITH DIRECTED SUBMUCOSAL INJECTION(S), ANY SUBSTANCE;  Surgeon: Leland Her, MD;  Location: [facility] PROCEDURES [facility];  Service: Gastroenterology    PR EXPLORATORY OF ABDOMEN N/A 01/24/2020    Procedure: EXPLORATORY LAPAROTOMY, EXPLORATORY CELIOTOMY WITH OR WITHOUT BIOPSY(S);  Surgeon: [name], MD;  Location: MAIN OR [place];  Service: Trauma    PR FREEING BOWEL ADHESION,ENTEROLYSIS N/A 01/24/2020    Procedure: ENTEROLYSIS (SEPART PROC);  Surgeon: [name], MD;  Location: MAIN OR [facility];  Service: Trauma    PR IMPLANT MESH HERNIA REPAIR/DEBRIDEMENT CLOSURE N/A 01/03/2015    Procedure: IMPLANTATION OF MESH/OTHER PROSTHES INCISION/VENTRAL HERNIA REPAIR/MESH CLOSE DEBRID NECROT SOFT TIS INFECT;  Surgeon: Romero Belling, MD;  Location: MAIN OR [place];  Service: Gastrointestinal    PR LAP, VENTRAL HERNIA REPAIR,REDUCIBLE N/A 04/07/2014    Procedure: LAPAROSCOPY, SURGICAL, REPAIR, VENTRAL, UMBILICAL, SPIGELIAN OR EPIGASTRIC HERNIA, REDUCIBLE;  Surgeon: Romero Belling, MD;  Location: MAIN OR [place];  Service: Gastrointestinal    PR NEGATIVE PRESSURE WOUND THERAPY DME >50 SQ CM N/A 01/24/2020    Procedure: NEG PRESS WOUND TX (VAC ASSIST) INCL TOPICALS, PER SESSION, TSA GREATER THAN/= 50 CM SQUARED;  Surgeon: [name], MD;  Location: MAIN OR [place];  Service: Trauma    PR REMOVAL NODES, NECK,CERV CMPLT Left 12/07/2012    Procedure: CERVICAL LYMPHADENECTOMY (COMPLETE);  Surgeon: Charlott Rakes, MD;  Location: MAIN OR [facility];  Service: Surgical Oncology    PR REMOVAL NODES, NECK,CERV MOD RAD Right 03/29/2013    Procedure: CERVICAL LYMPHADENECTOMY (MODIFIED RADICAL NECK DISSECTION);  Surgeon: Charlott Rakes, MD;  Location: MAIN OR [facility];  Service: Surgical Oncology    PR REPAIR RECURR INCIS [facility] N/A 01/03/2015    Procedure: REPAIR RECURRENT INCISIONAL OR VENTRAL HERNIA; REDUCIBLE;  Surgeon: Romero Belling, MD;  Location: MAIN OR [facility];  Service: Gastrointestinal    PR REPAIR RECURR INCIS HERNIA,STRANG N/A 06/21/2016    Procedure: REPAIR RECURRENT INCISIONAL OR VENTRAL HERNIA; INCARCERATED OR STRANGULATED;  Surgeon: Mickle Asper, MD;  Location: [facility] OR [facility];  Service: Gastrointestinal    PR REPAIR RECURR INCIS HERNIA,STRANG N/A 01/24/2020    Procedure: REPAIR RECURRENT INCISIONAL OR VENTRAL HERNIA; INCARCERATED OR STRANGULATED;  Surgeon: [name], MD;  Location: MAIN OR [place];  Service: Trauma    PR SIGMOIDOSCOPY,FINE NEEDL BX,US GUIDED N/A 01/09/2018    Procedure: SIGMOIDOSCOPY, FLEXIBLE, W/TRANSENDOSCOPIC ULTRASOUND GUIDED NEEDLE ASPIRATION;  Surgeon: Vonda Antigua, MD;  Location: GI PROCEDURES [facility] [place];  Service: Gastroenterology       Family History  Family History   Problem Relation Age of Onset    Hyperthyroidism Mother     Osteoporosis Mother     Arrhythmia Mother     Squamous cell carcinoma Mother         basal cell vs squamous cell skin cancer    Coronary artery disease Father         s/p CABG    Diabetes Father     Hypertension Father     Prostate cancer Father     Colon cancer Paternal Grandmother     Thyroid disease Neg Hx        Social History  Social History     Tobacco Use    Smoking status: Never     Passive exposure: Never    Smokeless tobacco: Never   Vaping Use    Vaping status: Never Used   Substance Use Topics  Alcohol use: Yes     Comment: social drinker 3 times yearly    Drug use: No

## 2023-02-13 DIAGNOSIS — C439 Malignant melanoma of skin, unspecified: Principal | ICD-10-CM

## 2023-02-13 MED ORDER — ONDANSETRON HCL 8 MG TABLET
ORAL_TABLET | Freq: Three times a day (TID) | ORAL | 1 refills | 10.00 days | Status: CP | PRN
Start: 2023-02-13 — End: ?

## 2023-02-13 NOTE — Unmapped (Signed)
Hi,    Patient Kenneth Fitzgerald [facility] called requesting a medication refill for the following:    Medication: ondansetron (ZOFRAN) 8 MG tablet   Pharmacy: [id number]      The expected turnaround time is 3-4 business days     Thank you,  Durward Fortes  [facility]  [phone]    [facility]_Oncology_Oper

## 2023-02-18 ENCOUNTER — Ambulatory Visit: Admit: 2023-02-18 | Discharge: 2023-02-19 | Payer: BLUE CROSS/BLUE SHIELD

## 2023-02-18 DIAGNOSIS — M47817 Spondylosis without myelopathy or radiculopathy, lumbosacral region: Principal | ICD-10-CM

## 2023-02-18 NOTE — Unmapped (Signed)
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 Patient was noted to have a large parasagittal hemorrhage on outpatient MRI. He was asymptomatic at the time but headaches gradually worsened and he had an episode of vomiting. Unclear if related to headaches or GI. CT with resolution of most of the blood and improved mass effect. Today he is aa, fs, mae ag. Reviewed imaging. Given improvement would not recommend any surgical resection. Would consider additional radiation to that area versus observation. Advised of signs and symptoms that would warrant more urgent evaluation. Sister a critical care nurse also assisting him. He endorses some left radicular pain to the mid calf and has some trace DF, EHL weakness. Reports history of multiple herniated discs. Will order updated lumbar MRI and refer to my spine colleagues. He is on steroids currently tapering to off.

## 2023-02-20 ENCOUNTER — Ambulatory Visit
Admit: 2023-02-20 | Discharge: 2023-02-21 | Payer: BLUE CROSS/BLUE SHIELD | Attending: Student in an Organized Health Care Education/Training Program | Primary: Student in an Organized Health Care Education/Training Program

## 2023-02-20 ENCOUNTER — Ambulatory Visit: Admit: 2023-02-20 | Discharge: 2023-02-21 | Payer: BLUE CROSS/BLUE SHIELD

## 2023-02-20 DIAGNOSIS — J310 Chronic rhinitis: Principal | ICD-10-CM

## 2023-02-20 DIAGNOSIS — J329 Chronic sinusitis, unspecified: Principal | ICD-10-CM

## 2023-02-20 NOTE — Unmapped (Signed)
Otolaryngology Clinic Note    Kenneth Fitzgerald is a 48 y.o. male is seen in consultation at the request of [name]  for evaluation of post nasal drip.       History of Present Illness:     The patient is a 48 y.o. 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 who presents for the evaluation of post nasal drip. He is s/p radiation therapy. He is experiencing significant post nasal drip to a point of ???gagging???. He is using Flonase and taking Allegra. He is not doing rinses with Neilmed. He has not been on antibiotics for a year.    Update 02/20/2023:  Kenneth Fitzgerald is a 48 y.o. male who last seen in 11/21/2022 who returns for follow-up. He is feeling significantly better and has less post nasal drainage. Denies any pain upper teeth. He had emesis a week ago. He is not tolerating irrigation well. He has recurrent stage IV melanoma. He has an MRI brain scheduled.     A 12 point ROS obtained today was negative except as indicated above.     Patients medical records were personally reviewed.      A 12 point review of systems was negative except as indicated.  The patient denies fevers, chills, shortness of breath, chest pain, nausea, vomiting, diarrhea, inability to lie flat, dysphagia, odynophagia, hemoptysis, hematemesis, changes in vision, changes in voice quality, otalgia, otorrhea, vertiginous symptoms, focal deficits, or other concerning symptoms.    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    Past Surgical History     has a past surgical history that includes pr removal nodes, neck,cerv cmplt (Left, 12/07/2012); pr removal nodes, neck,cerv mod rad (Right, 03/29/2013); pr lap, ventral hernia repair,reducible (N/A, 04/07/2014); pr repair recurr incis hernia,reduc (N/A, 01/03/2015); pr implant mesh hernia repair/debridement closure (N/A, 01/03/2015); pr repair recurr incis hernia,strang (N/A, 06/21/2016); pr colonoscopy w/biopsy single/multiple (N/A, 01/09/2018); pr sigmoidoscopy,fine needl bx,us guided (N/A, 01/09/2018); pr colonoscopy w/biopsy single/multiple (N/A, 08/24/2019); pr colsc flx with directed submucosal njx any sbst (N/A, 08/24/2019); pr exploratory of abdomen (N/A, 01/24/2020); pr freeing bowel adhesion,enterolysis (N/A, 01/24/2020); pr repair recurr incis hernia,strang (N/A, 01/24/2020); pr negative pressure wound therapy dme >50 sq cm (N/A, 01/24/2020); pr colonoscopy flx dx w/collj spec when pfrmd (N/A, 08/29/2021); and pr colonoscopy w/biopsy single/multiple (N/A, 08/29/2021).    Current Medications    Current Outpatient Medications   Medication Sig Dispense Refill    binimetinib ([name]) 15 mg tablet Take 3 tablets (45 mg total) by mouth two (2) times a day. 180 tablet 11    blood sugar diagnostic Strp Dispense 100 blood glucose test strips, ok to sub any brand preferred by insurance/patient, use 3x/day; dispense whatever brand matches with meter. 100 strip 12    blood-glucose meter kit Use as instructed; dispense 1 meter, whatever is preferred by insurance 1 each 1    calcitriol (ROCALTROL) 0.25 MCG capsule Take 1 capsule (0.25 mcg total) by mouth daily. 30 capsule 0    calcium  carbonate 650 mg calcium (1,625 mg) tablet Take 1 tablet (650 mg elem calcium total) by mouth Three (3) times a day with a meal.      dexAMETHasone (DECADRON) 2 MG tablet Take 2 tablets (4 mg total) by mouth every six (6) hours for 4 days, THEN 2 tablets (4 mg total) two (2) times a day for 2 days, THEN 2 tablets (4 mg total) daily for 2 days, THEN 1 tablet (2 mg total) daily for 2 days. 46 tablet 0    encorafenib (BRAFTOVI) 75 mg capsule Take 6 capsules (450 mg total) by mouth daily. 180 capsule 11    ezetimibe (ZETIA) 10 mg tablet Take 1 tablet (10 mg total) by mouth daily. 90 tablet 3    fexofenadine (ALLEGRA ALLERGY) 180 MG tablet Take 1 tablet (180 mg total) by mouth daily as needed (allergies).      fluticasone propionate (FLONASE) 50 mcg/actuation nasal spray 1 spray into each nostril daily.      folic acid (FOLVITE) 1 MG tablet Take 1 tablet (1,000 mcg total) by mouth daily. 90 tablet 3    ibuprofen (ADVIL,MOTRIN) 200 MG tablet Take 3 tablets (600 mg total) by mouth daily as needed for pain.      lancets Misc Dispense 100 lancets, ok to sub any brand preferred by insurance/patient, use 3x/day 100 each 12    levothyroxine (SYNTHROID) 125 MCG tablet Take 3 tablets (375 mcg total) by mouth daily. 270 tablet 3    ofloxacin (OCUFLOX) 0.3 % ophthalmic solution Instill 4 drops in left ear twice daily for 5 days 5 mL 3    ondansetron (ZOFRAN) 8 MG tablet Take 1 tablet (8 mg total) by mouth every eight (8) hours as needed for nausea. 30 tablet 1    sulfaSALAzine (AZULFIDINE) 500 mg tablet Take 2 tablets (1,000 mg total) by mouth two (2) times a day. 360 tablet 3     No current facility-administered medications for this visit.       Allergies    Allergies   Allergen Reactions    Compazine [Prochlorperazine] Itching    Coconut Nausea And Vomiting       Family History    Negative for bleeding disorders or free bleeding.     family history includes Arrhythmia in his mother; Colon cancer in his paternal grandmother; Coronary artery disease in his father; Diabetes in his father; Hypertension in his father; Hyperthyroidism in his mother; Osteoporosis in his mother; Prostate cancer in his father; Squamous cell carcinoma in his mother.    Social History:     reports that he has never smoked. He has never been exposed to tobacco smoke. He has never used smokeless tobacco.   reports current alcohol use.   reports no history of drug use.    Review of Systems    A 12 system review of systems was performed and is negative other than that noted in the history of present illness.    Vital Signs  There were no vitals taken for this visit.      Physical Exam    General: Well-developed, well-nourished. Appropriate, comfortable, and in no apparent distress.  Head/Face: On external examination there is no obvious asymmetry or scars. On palpation there is no tenderness over maxillary sinuses or masses within the salivary glands. Cranial nerves V and VII are intact through all distributions.  Eyes: PERRL, EOMI, the conjunctiva are not injected and sclera is non-icteric.  Ears: On external exam, there is no obvious  lesions or asymmetry. The EACs are bilaterally without cerumen or lesions. The TMs are in the neutral position and are mobile to pneumatic otoscopy bilaterally. There are no middle ear masses or fluid noted. Hearing is grossly intact bilaterally.  Nose: On external exam there are neither lesions nor asymmetry of the nasal tip/ dorsum. On anterior rhinoscopy, visualization posteriorly is limited on anterior examination. For this reason, to adequately evaluate posteriorly for masses, polypoid disease and/or signs of infections, nasal endoscopy is indicated (see procedure below).  Oral cavity/oropharynx: The mucosa of the lips, gums, hard and soft palate, posterior pharyngeal wall, tongue, floor of mouth, and buccal region are without masses or lesions and are normally hydrated. Good dentition. Tongue protrudes midline. Tonsils are normal appearing. Supraglottis not visualized due to gag reflex.  Neck: There is no asymmetry or masses. Trachea is midline. There is no enlargement of the thyroid or palpable thyroid nodules.   Lymphatics: There is no palpable lymphadenopathy along the jugulodiagastric, submental, or posterior cervical chains.  Chest: No audible wheeze, unlabored respirations.  Cardiovascular: Regular rate.  GI: Nondistended.  Neurologic: Cranial nerve???s II-XII are grossly intact. Exam is non-focal.  Extremities: No cyanosis, clubbing or edema.    Procedures:  Diagnostic Bilateral Nasal Endoscopy performed on 02/20/2023 (CPT 31231)    NOTE: Nasal endoscopy is performed for the sinuses only, and not for examination of the skull base, septum or inferior turbinates, nor is it related to any previously performed septoplasty or inferior turbinate surgery or skull base surgery.     Surgeon: Egbert Garibaldi, MD  Anesthesia: none  Procedure Detail:  As a result of inability to visualize the intranasal anatomy, and after discussion of the potential risks related to the procedure (primarily bleeding), a endoscope is used to examine the left and right sinonasal cavities, including the interior of the nasal cavity and the middle and superior meatus, the turbinates, and the spheno-ethmoid recess. All these areas were inspected.    Findings:    Left: Left septal deviation. Crusting on inferior turbinate. No polyps, no purulence.     Right: Middle turbinate intact. Mild septal deviation.     Kenneth Fitzgerald Nasal Endoscopy Score: [employer] is used to assess the degree of inflammation of the sinonasal structures, including the middle and superior turbinates, the ethmoid sinuses, maxillary sinuses, frontal sinuses, and sphenoid sinuses.  In the presence of previous surgery, some or all of these structures may be absent.    Left        Polyps:  Absent (0)   Edema:   Absent (0)   Discharge:  None (0)    Scarring:  Absent (0)   Crusting:  Mild(1)      Total Left:  1     Right         Polyps:  Absent (0)   Edema:  Absent (0)   Discharge: None(0)    Scarring:  Absent (0)   Crusting:  None (0)      Total Right:   0      Labs and Diagnostic Tests  No labs reviewed.       Assessment:  The patient is a 48 y.o. 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 who presents for the  evaluation of:  Post nasal drip.     Recommendations:  1. Discussed that there is no evidence of purulence coming out of nose on examination. Continue current regimen.   Will send in a prescription of Augmentin if infection recurs. Will consider surgery if symptoms do not improve.     2.Will send in a referral to medical dentistry for radiation problems.    3. Patient has recurrent stage IV melanoma. Continue to follow with Oncology.     4.Follow-up in 4-6 months.     The patient's physical examination findings including flexible fiberoptic nasopharyngolaryngosocpy/sinonasal endoscopy were thoroughly discussed.    The patient voiced complete understanding of plan as detailed above and is in full agreement.    Scribe's attestation:  February 20, 2023, 11:12 AM. Egbert Garibaldi, MD obtained and performed the history, physical exam and medical decision-making elements that were entered into the chart. Signed by Maurice March, Scribe, on August 15, 2022 at 11:12 AM.    ----------------------------------------------------------------------------------------------------------------------  Provider's attestation: February 20, 2023 11:12 AM. Documentation assistance provided by the Scribe. I was present during the time the encounter was recorded. The information recorded by the Scribe was done at my direction and has been reviewed and validated by me.  ----------------------------------------------------------------------------------------------------------------------

## 2023-02-21 DIAGNOSIS — E278 Other specified disorders of adrenal gland: Principal | ICD-10-CM

## 2023-02-21 MED ORDER — CALCITRIOL 0.25 MCG CAPSULE
ORAL_CAPSULE | Freq: Every day | ORAL | 1 refills | 0.00 days
Start: 2023-02-21 — End: ?

## 2023-02-24 NOTE — Unmapped (Signed)
Pt is requesting refill    Most recent clinic visit: 01/30/2023  Next clinic visit:  04/30/2023

## 2023-02-27 DIAGNOSIS — C439 Malignant melanoma of skin, unspecified: Principal | ICD-10-CM

## 2023-02-27 DIAGNOSIS — E278 Other specified disorders of adrenal gland: Principal | ICD-10-CM

## 2023-02-27 MED ORDER — CALCITRIOL 0.25 MCG CAPSULE
ORAL_CAPSULE | Freq: Every day | ORAL | 0 refills | 30.00 days
Start: 2023-02-27 — End: ?

## 2023-03-05 DIAGNOSIS — E278 Other specified disorders of adrenal gland: Principal | ICD-10-CM

## 2023-03-05 MED ORDER — CALCITRIOL 0.25 MCG CAPSULE
ORAL_CAPSULE | Freq: Every day | ORAL | 3 refills | 90.00 days | Status: CP
Start: 2023-03-05 — End: 2024-03-04

## 2023-03-05 NOTE — Unmapped (Signed)
 Patient is requesting the following refill  Requested Prescriptions     Pending Prescriptions Disp Refills    calcitriol (ROCALTROL) 0.25 MCG capsule 90 capsule 3     Sig: Take 1 capsule (0.25 mcg total) by mouth daily.       Recent Visits  Date Type Provider Dept   12/30/22 Office Visit Mangel, Benison Pap, DO [place] [facility] [address] [facility]   08/30/22 Office Visit Mangel, Benison Pap, DO [place] [facility] [address] [facility]   05/30/22 Office Visit Mangel, Benison Pap, DO [place] [facility] [address] [facility]   Showing recent visits within past 365 days and meeting all other requirements  Future Appointments  Date Type Provider Dept   06/03/23 Appointment Mangel, Benison Pap, DO  [facility] [address] At [facility]   Showing future appointments within next 365 days and meeting all other requirements       Labs:  10/07/22 calcium 9.0

## 2023-03-05 NOTE — Unmapped (Signed)
 Patient requesting Calcitrol. RX refill request sent to Dr. Nedra Hai with oncology.

## 2023-03-06 NOTE — Unmapped (Signed)
 Called pt and made aware that he has refills at [facility]

## 2023-03-06 NOTE — Unmapped (Signed)
 Copied from CRM #[id number]. Topic: Access To Clinicians - Medication Refill  >> Mar 06, 2023  1:10 PM Sydney N wrote:  The caller has not previously requested the refill from their pharmacy.    The patient is requesting the following:     Medication(s) for refill:   calcitriol (ROCALTROL) 0.25 MCG capsule      Quantity for 1 month supply    [facility] and address[id number] [place] - [address]  [address] [place] [address]  Phone: [phone] Fax: [phone]  Hours: Not open 24 hours    Please contact The patient by Cell Phone in regards to this request.    Coverage: yes, coverage is accurate on file.    Urgent callback turnaround time: within 24 business hours. (Caller Notified)    Urgent Reason: Almost or completely out of medication(s)

## 2023-03-10 NOTE — Unmapped (Signed)
 [facility] Clinical Assessment & Refill Coordination Note    Kenneth [place], DOB: [birth year]  Phone: [phone] (home)     All above HIPAA information was verified with patient.     Was a Nurse, learning disability used for this call? No    Specialty Medication(s):   Hematology/Oncology: [name]     Current Outpatient Medications   Medication Sig Dispense Refill    binimetinib ([name]) 15 mg tablet Take 3 tablets (45 mg total) by mouth two (2) times a day. 180 tablet 11    blood sugar diagnostic Strp Dispense 100 blood glucose test strips, ok to sub any brand preferred by insurance/patient, use 3x/day; dispense whatever brand matches with meter. 100 strip 12    blood-glucose meter kit Use as instructed; dispense 1 meter, whatever is preferred by insurance 1 each 1    calcitriol (ROCALTROL) 0.25 MCG capsule Take 1 capsule (0.25 mcg total) by mouth daily. 90 capsule 3    calcium carbonate 650 mg calcium (1,625 mg) tablet Take 1 tablet (650 mg elem calcium total) by mouth Three (3) times a day with a meal.      encorafenib (BRAFTOVI) 75 mg capsule Take 6 capsules (450 mg total) by mouth daily. 180 capsule 11    ezetimibe (ZETIA) 10 mg tablet Take 1 tablet (10 mg total) by mouth daily. 90 tablet 3    fexofenadine (ALLEGRA ALLERGY) 180 MG tablet Take 1 tablet (180 mg total) by mouth daily as needed (allergies).      fluticasone propionate (FLONASE) 50 mcg/actuation nasal spray 1 spray into each nostril daily.      folic acid (FOLVITE) 1 MG tablet Take 1 tablet (1,000 mcg total) by mouth daily. 90 tablet 3    ibuprofen (ADVIL,MOTRIN) 200 MG tablet Take 3 tablets (600 mg total) by mouth daily as needed for pain.      lancets Misc Dispense 100 lancets, ok to sub any brand preferred by insurance/patient, use 3x/day 100 each 12    levothyroxine (SYNTHROID) 125 MCG tablet Take 3 tablets (375 mcg total) by mouth daily. 270 tablet 3    ofloxacin (OCUFLOX) 0.3 % ophthalmic solution Instill 4 drops in left ear twice daily for 5 days 5 mL 3    ondansetron (ZOFRAN) 8 MG tablet Take 1 tablet (8 mg total) by mouth every eight (8) hours as needed for nausea. 30 tablet 1    sulfaSALAzine (AZULFIDINE) 500 mg tablet Take 2 tablets (1,000 mg total) by mouth two (2) times a day. 360 tablet 3     No current facility-administered medications for this visit.        Changes to medications: Kenneth [place] reports no changes at this time.    Medication list has been reviewed and updated in Epic: Yes    Allergies   Allergen Reactions    Compazine [Prochlorperazine] Itching    Coconut Nausea And Vomiting       Changes to allergies: No    Allergies have been reviewed and updated in Epic: Yes    SPECIALTY MEDICATION ADHERENCE     Braftovi 75 mg: 0 days of medicine on hand   [name] 15 mg: 0 days of medicine on hand     Medication Adherence    Patient reported X missed doses in the last month: 0  Specialty Medication: Braftovi 15mg   Patient is on additional specialty medications: Yes  Additional Specialty Medications: [name] 15mg   Confirmed plan for next specialty medication refill: delivery  by pharmacy  Refills needed for supportive medications: not needed          Specialty medication(s) dose(s) confirmed: Regimen is correct and unchanged.     Are there any concerns with adherence? No    Adherence counseling provided? Not needed    CLINICAL MANAGEMENT AND INTERVENTION      Clinical Benefit Assessment:    Do you feel the medicine is effective or helping your condition? Yes    Clinical Benefit counseling provided? Not needed    Adverse Effects Assessment:    Are you experiencing any side effects? No    Are you experiencing difficulty administering your medicine? No    Quality of Life Assessment:    Quality of Life      Oncology  1. What impact has your specialty medication had on the reduction of your daily pain or discomfort level?: None  2. On a scale of 1-10, how would you rate your ability to manage side effects associated with your specialty medication? (1=no issues, 10 = unable to take medication due to side effects): 1            How many days over the past month did your condition/medication  keep you from your normal activities? For example, brushing your teeth or getting up in the morning. 0    Have you discussed this with your provider? Not needed    Acute Infection Status:    Acute infections noted within Epic:  No active infections  Patient reported infection: None    Therapy Appropriateness:    Is therapy appropriate based on current medication list, adverse reactions, adherence, clinical benefit and progress toward achieving therapeutic goals? Yes, therapy is appropriate and should be continued     DISEASE/MEDICATION-SPECIFIC INFORMATION      N/A    Oncology: Is the patient receiving adequate infection prevention treatment? Not applicable  Does the patient have adequate nutritional support? Not applicable    PATIENT SPECIFIC NEEDS     Does the patient have any physical, cognitive, or cultural barriers? No    Is the patient high risk? Yes, patient is taking oral chemotherapy. Appropriateness of therapy as been assessed    Did the patient require a clinical intervention? No    Does the patient require physician intervention or other additional services (i.e., nutrition, smoking cessation, social work)? No    Does the patient have an additional or emergency contact listed in their chart? Yes    SOCIAL DETERMINANTS OF HEALTH     At the [facility], we have learned that life circumstances - like trouble affording food, housing, utilities, or transportation can affect the health of many of our patients.   That is why we wanted to ask: are you currently experiencing any life circumstances that are negatively impacting your health and/or quality of life? No    Social Drivers of Health     Food Insecurity: No Food Insecurity (11/05/2021)    Hunger Vital Sign     Worried About Running Out of Food in the Last Year: Never true Ran Out of Food in the Last Year: Never true   Internet Connectivity: No Internet connectivity concern identified (11/05/2021)    Internet Connectivity     Do you have access to internet services: Yes     How do you connect to the internet: Personal Device at home     Is your internet connection strong enough for you to watch video on your device without major  problems?: Yes     Do you have enough data to get through the month?: Yes     Does at least one of the devices have a camera that you can use for video chat?: Yes   Housing/Utilities: Low Risk  (11/05/2021)    Housing/Utilities     Within the past 12 months, have you ever stayed: outside, in a car, in a tent, in an overnight shelter, or temporarily in someone else's home (i.e. couch-surfing)?: No     Are you worried about losing your housing?: No     Within the past 12 months, have you been unable to get utilities (heat, electricity) when it was really needed?: No   Tobacco Use: Low Risk  (02/20/2023)    Patient History     Smoking Tobacco Use: Never     Smokeless Tobacco Use: Never     Passive Exposure: Never   Transportation Needs: No Transportation Needs (11/05/2021)    PRAPARE - Transportation     Lack of Transportation (Medical): No     Lack of Transportation (Non-Medical): No   Alcohol Use: Not At Risk (11/05/2021)    Alcohol Use     How often do you have a drink containing alcohol?: Monthly or less     How many drinks containing alcohol do you have on a typical day when you are drinking?: 1 - 2     How often do you have 5 or more drinks on one occasion?: Never   Interpersonal Safety: Not At Risk (11/05/2021)    Interpersonal Safety     Unsafe Where You Currently Live: No     Physically Hurt by Anyone: No     Abused by Anyone: No   Physical Activity: Inactive (11/05/2021)    Exercise Vital Sign     Days of Exercise per Week: 0 days     Minutes of Exercise per Session: 0 min   Intimate Partner Violence: Not At Risk (11/05/2021)    Humiliation, Afraid, Rape, and Kick questionnaire     Fear of Current or Ex-Partner: No     Emotionally Abused: No     Physically Abused: No     Sexually Abused: No   Stress: Stress Concern Present (11/05/2021)    [employer] of Occupational Health - Occupational Stress Questionnaire     Feeling of Stress : To some extent   Substance Use: Not on file (11/19/2022)   Social Connections: Moderately Isolated (11/05/2021)    Social Connection and Isolation Panel [NHANES]     Frequency of Communication with Friends and Family: More than three times a week     Frequency of Social Gatherings with Friends and Family: Once a week     Attends Religious Services: More than 4 times per year     Active Member of [employer] or Organizations: No     Attends Banker Meetings: Never     Marital Status: Never married   Physicist, medical Strain: Low Risk  (11/05/2021)    Overall Financial Resource Strain (CARDIA)     Difficulty of Paying Living Expenses: Not very hard   Depression: Not at risk (11/12/2021)    PHQ-2     PHQ-2 Score: 1   Health Literacy: Low Risk  (11/05/2021)    Health Literacy     : Never       Would you be willing to receive help with any of the needs that you have identified today? Not applicable  SHIPPING     Specialty Medication(s) to be Shipped:   Hematology/Oncology: [name] and [name]    Other medication(s) to be shipped: No additional medications requested for fill at this time     Changes to insurance: No    Delivery Scheduled: Yes, Expected medication delivery date: 03/13/23.     Medication will be delivered via UPS to the confirmed prescription address in [facility] [place], [place].    The patient will receive a drug information handout for each medication shipped and additional FDA Medication Guides as required.  Verified that patient has previously received a Conservation officer, historic buildings and a Surveyor, mining.    The patient or caregiver noted above participated in the development of this care plan and knows that they can request review of or adjustments to the care plan at any time.      All of the patient's questions and concerns have been addressed.    Rollen Sox, [facility]   [facility] Specialty Pharmacist

## 2023-03-12 MED FILL — MEKTOVI 15 MG TABLET: ORAL | 30 days supply | Qty: 180 | Fill #3

## 2023-03-12 MED FILL — BRAFTOVI 75 MG CAPSULE: ORAL | 30 days supply | Qty: 180 | Fill #3

## 2023-03-24 ENCOUNTER — Inpatient Hospital Stay: Admit: 2023-03-24 | Discharge: 2023-03-25 | Payer: BLUE CROSS/BLUE SHIELD

## 2023-03-24 ENCOUNTER — Encounter: Admit: 2023-03-24 | Discharge: 2023-03-25 | Payer: BLUE CROSS/BLUE SHIELD

## 2023-03-24 ENCOUNTER — Ambulatory Visit: Admit: 2023-03-24 | Discharge: 2023-03-25 | Payer: BLUE CROSS/BLUE SHIELD

## 2023-03-24 LAB — URINALYSIS WITH MICROSCOPY WITH CULTURE REFLEX PERFORMABLE
BACTERIA: NONE SEEN /HPF
BILIRUBIN UA: NEGATIVE
BLOOD UA: NEGATIVE
GLUCOSE UA: NEGATIVE
HYALINE CASTS: 9 /LPF — ABNORMAL HIGH (ref 0–1)
KETONES UA: NEGATIVE
LEUKOCYTE ESTERASE UA: NEGATIVE
NITRITE UA: NEGATIVE
PH UA: 7 (ref 5.0–9.0)
PROTEIN UA: 50 — AB
RBC UA: 2 /HPF (ref <=3)
SPECIFIC GRAVITY UA: 1.028 (ref 1.003–1.030)
SQUAMOUS EPITHELIAL: <1 /HPF (ref 0–5)
UROBILINOGEN UA: <2
WBC UA: 2 /HPF (ref <=2)

## 2023-03-24 LAB — COMPREHENSIVE METABOLIC PANEL
ALBUMIN: 5 g/dL (ref 3.4–5.0)
ALKALINE PHOSPHATASE: 109 U/L (ref 46–116)
ALT (SGPT): 56 U/L — ABNORMAL HIGH (ref 10–49)
ANION GAP: 18 mmol/L — ABNORMAL HIGH (ref 5–14)
AST (SGOT): 38 U/L — ABNORMAL HIGH (ref <=34)
BILIRUBIN TOTAL: 0.8 mg/dL (ref 0.3–1.2)
BLOOD UREA NITROGEN: 15 mg/dL (ref 9–23)
BUN / CREAT RATIO: 17
CALCIUM: 9.3 mg/dL (ref 8.7–10.4)
CHLORIDE: 98 mmol/L (ref 98–107)
CO2: 23 mmol/L (ref 20.0–31.0)
CREATININE: 0.86 mg/dL (ref 0.73–1.18)
EGFR CKD-EPI (2021) MALE: >90 mL/min/{1.73_m2} (ref >=60)
GLUCOSE RANDOM: 135 mg/dL (ref 70–179)
POTASSIUM: 4.5 mmol/L (ref 3.4–4.8)
PROTEIN TOTAL: 8.5 g/dL — ABNORMAL HIGH (ref 5.7–8.2)
SODIUM: 139 mmol/L (ref 135–145)

## 2023-03-24 LAB — CBC W/ AUTO DIFF
BASOPHILS ABSOLUTE COUNT: 0 10*9/L (ref 0.0–0.1)
BASOPHILS RELATIVE PERCENT: 0.5 %
EOSINOPHILS ABSOLUTE COUNT: 0.1 10*9/L (ref 0.0–0.5)
EOSINOPHILS RELATIVE PERCENT: 0.7 %
HEMATOCRIT: 45 % (ref 39.0–48.0)
HEMOGLOBIN: 15.4 g/dL (ref 12.9–16.5)
LYMPHOCYTES ABSOLUTE COUNT: 0.9 10*9/L — ABNORMAL LOW (ref 1.1–3.6)
LYMPHOCYTES RELATIVE PERCENT: 8.6 %
MEAN CORPUSCULAR HEMOGLOBIN CONC: 34.2 g/dL (ref 32.0–36.0)
MEAN CORPUSCULAR HEMOGLOBIN: 30 pg (ref 25.9–32.4)
MEAN CORPUSCULAR VOLUME: 87.7 fL (ref 77.6–95.7)
MEAN PLATELET VOLUME: 8.2 fL (ref 6.8–10.7)
MONOCYTES ABSOLUTE COUNT: 0.6 10*9/L (ref 0.3–0.8)
MONOCYTES RELATIVE PERCENT: 5.8 %
NEUTROPHILS ABSOLUTE COUNT: 9.2 10*9/L — ABNORMAL HIGH (ref 1.8–7.8)
NEUTROPHILS RELATIVE PERCENT: 84.4 %
PLATELET COUNT: 277 10*9/L (ref 150–450)
RED BLOOD CELL COUNT: 5.13 10*12/L (ref 4.26–5.60)
RED CELL DISTRIBUTION WIDTH: 17.5 % — ABNORMAL HIGH (ref 12.2–15.2)
WBC ADJUSTED: 10.9 10*9/L (ref 3.6–11.2)

## 2023-03-24 LAB — SLIDE REVIEW

## 2023-03-24 LAB — LIPASE: LIPASE: 38 U/L (ref 12–53)

## 2023-03-24 NOTE — Unmapped (Signed)
Today's neurosurgery consult note will serve as admission H&P.

## 2023-03-24 NOTE — Unmapped (Addendum)
 Pt reports he fell last Thursday and hit his head. Last night began vomiting. No blood thinners. Denies vision changes or LOC.    Pt reports hx of brain bleed in January.

## 2023-03-24 NOTE — Unmapped (Signed)
 Pt c/o vomiting and HA since last night, was seen for fall and brain bleed on Friday, denies dizziness, blurry vision

## 2023-03-24 NOTE — Unmapped (Signed)
 NEUROSURGERY CONSULT NOTE      Requesting Attending Physician:  Zara Council, MD  Service Requesting Consult:  Neurosurgery [facility])    Assessment and Recommendations  Kenneth [place] is a 48 y.o. 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 CTH 3/10 personally reviewed which demonstrates interval increase in blood products when compared to recent [facility] 1/29 with similar to slightly increased mass effect/cerebral edema. The ventricles appear asymmetric, R>L, however this is improved from prior [facility]. Neurological exam is reassuring at this time and nonfocal. At this time, no acute neurosurgical intervention is recommended but will plan to admit overnight for observation given the increase in blood products and midline shift.     - Admit to [facility] cranial floor   - No neurosurgical intervention indicated at this time   - MRI Brain w wo contrast   - Start dexamethasone 4mg  q12   - NPO at midnight   - Baseline pre-op labs     Patient was discussed with Lanier Ensign, MD and staffed with Zara Council, MD.   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 right handed male seen in consultation at the request of Vibhor Mellody Drown, MD for evaluation of headache and emesis in the setting of known RT frontal hemorrhagic tumor. 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  Per patient he had a fall on Thursday and hit is head at that time. Denies LOC. Patient denying any visual changes or seizure likely activity, issues with  balance, coordination, ambulation, weakness, or numbness. States that this last night and this morning, he had 6-8 episodes of emesis.Of note, he has vomiting about 1-2 times per week for many years, usually in the mornings, but this felt like a larger amount and unprovoked from food. He states he is taking tylenol for his headaches which have not resolved his symptoms. Patient was on a decadron taper that he finished about two weeks ago. Patient does not take any AP/AC.     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)   12/22/2019 Initial Diagnosis    Malignant neoplasm of brain, unspecified location (CMS-HCC)     12/22/2019 - 03/02/2022 Radiation    Radiation Therapy Treatment Details (12/22/2019 - 03/02/2022)  Site: Brain  Technique: IMRT  Goal: No goal specified  Planned Treatment Start Date: No planned start date specified  04/29/2022 -  Radiation    Radiation Therapy Treatment Details (Noted on 04/29/2022)  Site: Not Applicable Brain  Technique: SRS  Goal: No goal specified  Planned Treatment Start Date: No planned start date specified         Review of Systems  A 10-system review of systems was conducted and was negative except as documented above in the HPI.  ______________________________________________________________________    Physical Exam  General: No acute distress; Body mass index is 39.71 kg/m??.    Neurological Exam:  EOSp  AOX3  PERRL  EOMI  FS  TML  5/5 x4  SILT  Slight LT pronator drift     Cardiovascular: Warm and well perfused with good pulses, no edema  Pulmonary: Unlabored breathing, no pursed lips or wheezing, acyanotic  Abdomen: Soft, nontender, nondistended    Neurological imaging reviewed  [facility] 3/10 and 1/29 were personally reviewed per above    The patient's available vitals, intake/output, medications, labs, and relevant neurological imaging were independently reviewed.    ---Historical data---    Allergies  Compazine [prochlorperazine] and Coconut    Medications  Reviewed in Epic.  Medications relevant to this consult listed below.    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  Thyroidectomy       Past Surgical History  Past Surgical History:   Procedure Laterality Date    COLON SURGERY      Partial Colectomy    COLONOSCOPY      COSMETIC SURGERY      Related to excision of Cancer.    PR COLONOSCOPY FLX DX W/COLLJ SPEC WHEN PFRMD N/A 08/29/2021    Procedure: COLONOSCOPY, FLEXIBLE, PROXIMAL TO SPLENIC FLEXURE; DIAGNOSTIC, W/WO COLLECTION SPECIMEN BY BRUSH OR WASH;  Surgeon: Luanne Bras, MD;  [facility];  Service: Gastroenterology    PR COLONOSCOPY W/BIOPSY SINGLE/MULTIPLE N/A 01/09/2018    Procedure: COLONOSCOPY, FLEXIBLE, PROXIMAL TO SPLENIC FLEXURE; WITH BIOPSY, SINGLE OR MULTIPLE;  Surgeon: Vonda Antigua, MD;  [facility];  Service: Gastroenterology    PR COLONOSCOPY W/BIOPSY SINGLE/MULTIPLE N/A 08/24/2019    Procedure: COLONOSCOPY, FLEXIBLE, PROXIMAL TO SPLENIC FLEXURE; WITH BIOPSY, SINGLE OR MULTIPLE;  Surgeon: Leland Her, MD;  [facility];  Service: Gastroenterology    PR COLONOSCOPY W/BIOPSY SINGLE/MULTIPLE N/A 08/29/2021    Procedure: COLONOSCOPY, FLEXIBLE, PROXIMAL TO SPLENIC FLEXURE; WITH BIOPSY, SINGLE OR MULTIPLE;  Surgeon: Luanne Bras, MD;  Location: [facility]  Service: Gastroenterology    PR COLSC FLX WITH DIRECTED SUBMUCOSAL NJX ANY SBST N/A 08/24/2019    Procedure: COLONOSCOPY, FLEXIBLE, PROXIMAL  TO SPLENIC FLEXURE; WITH DIRECTED SUBMUCOSAL INJECTION(S), ANY SUBSTANCE;  Surgeon: Leland Her, MD;  [facility]: [facility];  Service: Gastroenterology    PR EXPLORATORY OF ABDOMEN N/A 01/24/2020    Procedure: EXPLORATORY LAPAROTOMY, EXPLORATORY CELIOTOMY WITH OR WITHOUT BIOPSY(S);  Surgeon: [name], MD;  Location: MAIN OR [place];  Service: Trauma    PR FREEING BOWEL ADHESION,ENTEROLYSIS N/A 01/24/2020    Procedure: ENTEROLYSIS (SEPART PROC);  Surgeon: [name], MD;  Location: MAIN OR [facility];  Service: Trauma    PR IMPLANT MESH HERNIA REPAIR/DEBRIDEMENT CLOSURE N/A 01/03/2015    Procedure: IMPLANTATION OF MESH/OTHER PROSTHES INCISION/VENTRAL HERNIA REPAIR/MESH CLOSE DEBRID NECROT SOFT TIS INFECT;  Surgeon: Romero Belling, MD;  Location: MAIN OR [place];  Service: Gastrointestinal    PR LAP, VENTRAL HERNIA REPAIR,REDUCIBLE N/A 04/07/2014    Procedure: LAPAROSCOPY, SURGICAL, REPAIR, VENTRAL, UMBILICAL, SPIGELIAN OR EPIGASTRIC HERNIA, REDUCIBLE;  Surgeon: Romero Belling, MD;  Location: MAIN OR [place];  Service: Gastrointestinal    PR NEGATIVE PRESSURE WOUND THERAPY DME >50 SQ CM N/A 01/24/2020    Procedure: NEG PRESS WOUND TX (VAC ASSIST) INCL TOPICALS, PER SESSION, TSA GREATER THAN/= 50 CM SQUARED;  Surgeon: [name], MD;  Location: MAIN OR [place];  Service: Trauma    PR REMOVAL NODES, NECK,CERV CMPLT Left 12/07/2012    Procedure: CERVICAL LYMPHADENECTOMY (COMPLETE);  Surgeon: Charlott Rakes, MD;  Location: MAIN OR [facility];  Service: Surgical Oncology    PR REMOVAL NODES, NECK,CERV MOD RAD Right 03/29/2013    Procedure: CERVICAL LYMPHADENECTOMY (MODIFIED RADICAL NECK DISSECTION);  Surgeon: Charlott Rakes, MD;  Location: MAIN OR [facility];  Service: Surgical Oncology    PR REPAIR RECURR INCIS [facility] N/A 01/03/2015    Procedure: REPAIR RECURRENT INCISIONAL OR VENTRAL HERNIA; REDUCIBLE;  Surgeon: Romero Belling, MD;  Location: MAIN OR [facility];  Service: Gastrointestinal    PR REPAIR RECURR INCIS HERNIA,STRANG N/A 06/21/2016    Procedure: REPAIR RECURRENT INCISIONAL OR VENTRAL HERNIA; INCARCERATED OR STRANGULATED;  Surgeon: Mickle Asper, MD;  Location: MAIN OR [facility];  Service: Gastrointestinal    PR REPAIR RECURR INCIS HERNIA,STRANG N/A 01/24/2020    Procedure: REPAIR RECURRENT INCISIONAL OR VENTRAL HERNIA; INCARCERATED OR STRANGULATED;  Surgeon: [name], MD;  Location: MAIN OR [place];  Service: Trauma    PR SIGMOIDOSCOPY,FINE NEEDL BX,US GUIDED N/A 01/09/2018    Procedure: SIGMOIDOSCOPY, FLEXIBLE, W/TRANSENDOSCOPIC ULTRASOUND GUIDED NEEDLE ASPIRATION;  Surgeon: Vonda Antigua, MD;  [facility]: [facility];  Service: Gastroenterology    THYROID SURGERY         Family History  Family History   Problem Relation Age of Onset    Hyperthyroidism Mother     Osteoporosis Mother     Arrhythmia Mother     Squamous cell carcinoma Mother         basal cell vs squamous cell skin cancer    Arthritis Mother     Cancer Mother     Coronary artery disease Father         s/p CABG    Diabetes Father     Hypertension Father     Prostate cancer Father     Angina Father     Cancer Father     Heart disease Father     Colon cancer Paternal Grandmother Diabetes Paternal Grandmother     Diabetes Maternal Aunt     Diabetes Paternal Uncle     Diabetes Paternal Aunt     Thyroid disease Neg Hx  Social History  Social History     Tobacco Use    Smoking status: Never     Passive exposure: Never    Smokeless tobacco: Never   Vaping Use    Vaping status: Never Used   Substance Use Topics    Alcohol use: Yes     Comment: social drinker 3 times yearly    Drug use: No

## 2023-03-24 NOTE — Unmapped (Signed)
 [facility]  Emergency Department Provider Note     ED Clinical Impression     Final diagnoses:   None      Impression, Medical Decision Making, ED Course     Impression: 48 y.o. 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     Diagnostic workup as below.   - CT scan of head  - CMP   - CBC   - Neurosurgery consult   Orders Placed This Encounter   Procedures    CT head WO contrast    CBC w/ Differential    Comprehensive Metabolic Panel    Lipase Level    Urinalysis with Microscopy with Culture Reflex    ECG 12 Lead       MDM Elements  Discussion of Management with other Physicians, QHP or Appropriate Source: None  ____    The case was discussed with the attending physician, who is in agreement with the above assessment and plan.      History     Chief Complaint  Chief Complaint   Patient presents with    Emesis       HPI   Kenneth Fitzgerald is a 48 y.o. male with past medical history as below who presents with nausea and vomiting following a fall. Patient reports that he experienced a fall on last  Thursday, started experiencing nausea and vomiting last night. Patient reports associated headache. Denies any LOC, altered mental status or weakness.     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  Thyroidectomy       Past Surgical History:   Procedure Laterality Date    COLON SURGERY      Partial Colectomy    COLONOSCOPY      COSMETIC SURGERY      Related to excision of Cancer.    PR COLONOSCOPY FLX DX W/COLLJ SPEC WHEN PFRMD N/A 08/29/2021    Procedure: COLONOSCOPY, FLEXIBLE, PROXIMAL TO SPLENIC FLEXURE; DIAGNOSTIC, W/WO COLLECTION SPECIMEN BY BRUSH OR WASH;  Surgeon: Luanne Bras, MD;  Location: [facility];  Service: Gastroenterology    PR COLONOSCOPY W/BIOPSY SINGLE/MULTIPLE N/A 01/09/2018    Procedure: COLONOSCOPY, FLEXIBLE, PROXIMAL TO SPLENIC FLEXURE; WITH BIOPSY, SINGLE OR MULTIPLE;  Surgeon: Vonda Antigua, MD;  [facility]: [facility];  Service: Gastroenterology    PR COLONOSCOPY W/BIOPSY SINGLE/MULTIPLE N/A 08/24/2019    Procedure: COLONOSCOPY, FLEXIBLE, PROXIMAL TO SPLENIC FLEXURE; WITH BIOPSY, SINGLE OR MULTIPLE;  Surgeon: Leland Her, MD;  Location: [facility] PROCED[facility];  Service: Gastroenterology    PR COLONOSCOPY W/BIOPSY SINGLE/MULTIPLE N/A 08/29/2021    Procedure: COLONOSCOPY,  FLEXIBLE, PROXIMAL TO SPLENIC FLEXURE; WITH BIOPSY, SINGLE OR MULTIPLE;  Surgeon: Luanne Bras, MD;  Location: [facility];  Service: Gastroenterology    PR COLSC FLX WITH DIRECTED SUBMUCOSAL NJX ANY SBST N/A 08/24/2019    Procedure: COLONOSCOPY, FLEXIBLE, PROXIMAL TO SPLENIC FLEXURE; WITH DIRECTED SUBMUCOSAL INJECTION(S), ANY SUBSTANCE;  Surgeon: Leland Her, MD;  Location: [facility] PROCEDURES [facility] [facility];  Service: Gastroenterology    PR EXPLORATORY OF ABDOMEN N/A 01/24/2020    Procedure: EXPLORATORY LAPAROTOMY, EXPLORATORY CELIOTOMY WITH OR WITHOUT BIOPSY(S);  Surgeon: [name], MD;  Location: MAIN OR [place];  Service: Trauma    PR FREEING BOWEL ADHESION,ENTEROLYSIS N/A 01/24/2020    Procedure: ENTEROLYSIS (SEPART PROC);  Surgeon: [name], MD;  Location: MAIN OR [facility];  Service: Trauma    PR IMPLANT MESH HERNIA REPAIR/DEBRIDEMENT CLOSURE N/A 01/03/2015    Procedure: IMPLANTATION OF MESH/OTHER PROSTHES INCISION/VENTRAL HERNIA REPAIR/MESH CLOSE DEBRID NECROT SOFT TIS INFECT;  Surgeon: Romero Belling, MD;  Location: MAIN OR [place];  Service: Gastrointestinal    PR LAP, VENTRAL HERNIA REPAIR,REDUCIBLE N/A 04/07/2014    Procedure: LAPAROSCOPY, SURGICAL, REPAIR, VENTRAL, UMBILICAL, SPIGELIAN OR EPIGASTRIC HERNIA, REDUCIBLE;  Surgeon: Romero Belling, MD;  Location: MAIN OR [place];  Service: Gastrointestinal    PR NEGATIVE PRESSURE WOUND THERAPY DME >50 SQ CM N/A 01/24/2020    Procedure: NEG PRESS WOUND TX (VAC ASSIST) INCL TOPICALS, PER SESSION, TSA GREATER THAN/= 50 CM SQUARED;  Surgeon: [name], MD;  Location: MAIN OR [place];  Service: Trauma    PR REMOVAL NODES, NECK,CERV CMPLT Left 12/07/2012    Procedure: CERVICAL LYMPHADENECTOMY (COMPLETE);  Surgeon: Charlott Rakes, MD;  Location: MAIN OR [facility];  Service: Surgical Oncology    PR REMOVAL NODES, NECK,CERV MOD RAD Right 03/29/2013    Procedure: CERVICAL LYMPHADENECTOMY (MODIFIED RADICAL NECK DISSECTION);  Surgeon: Charlott Rakes, MD;  Location: MAIN OR [facility];  Service: Surgical Oncology    PR REPAIR RECURR INCIS [facility] N/A 01/03/2015    Procedure: REPAIR RECURRENT INCISIONAL OR VENTRAL HERNIA; REDUCIBLE;  Surgeon: Romero Belling, MD;  Location: MAIN OR [facility];  Service: Gastrointestinal    PR REPAIR RECURR INCIS HERNIA,STRANG N/A 06/21/2016    Procedure: REPAIR RECURRENT INCISIONAL OR VENTRAL HERNIA; INCARCERATED OR STRANGULATED;  Surgeon: Mickle Asper, MD;  Location: MAIN OR [facility];  Service: Gastrointestinal    PR REPAIR RECURR INCIS HERNIA,STRANG N/A 01/24/2020    Procedure: REPAIR RECURRENT INCISIONAL OR VENTRAL HERNIA; INCARCERATED OR STRANGULATED;  Surgeon: [name], MD;  Location: MAIN OR [place];  Service: Trauma    PR SIGMOIDOSCOPY,FINE NEEDL BX,US GUIDED N/A 01/09/2018    Procedure: SIGMOIDOSCOPY, FLEXIBLE, W/TRANSENDOSCOPIC ULTRASOUND GUIDED NEEDLE ASPIRATION;  Surgeon: Vonda Antigua, MD;  [facility]: [facility];  Service: Gastroenterology    THYROID SURGERY           Current Facility-Administered Medications:     ondansetron (ZOFRAN) injection 8 mg, 8 mg, Intravenous, Q6H PRN, Ladon Applebaum, MD, 4 mg at 03/24/23 2252    Current Outpatient Medications:     binimetinib ([name]) 15 mg tablet, Take 3 tablets (45 mg total) by mouth two (2) times a day., Disp: 180 tablet, Rfl: 11    blood sugar diagnostic Strp, Dispense 100 blood glucose test strips, ok to sub any brand preferred by insurance/patient, use 3x/day; dispense whatever brand matches with meter., Disp: 100 strip, Rfl: 12    blood-glucose meter kit, Use as instructed; dispense 1 meter, whatever is preferred by insurance, Disp:  1 each, Rfl: 1    calcitriol (ROCALTROL) 0.25 MCG capsule, Take 1 capsule (0.25 mcg total) by mouth daily., Disp: 90 capsule, Rfl: 3    calcium carbonate 650 mg calcium (1,625 mg) tablet, Take 1 tablet (650 mg elem calcium total) by mouth Three (3) times a day with a meal., Disp: , Rfl:     encorafenib (BRAFTOVI) 75 mg capsule, Take 6 capsules (450 mg total) by mouth daily., Disp: 180 capsule, Rfl: 11    ezetimibe (ZETIA) 10 mg tablet, Take 1 tablet (10 mg total) by mouth daily., Disp: 90 tablet, Rfl: 3    fexofenadine (ALLEGRA ALLERGY) 180 MG tablet, Take 1 tablet (180 mg total) by mouth daily as needed (allergies)., Disp: , Rfl:     fluticasone propionate (FLONASE) 50 mcg/actuation nasal spray, 1 spray into each nostril daily., Disp: , Rfl:     folic acid (FOLVITE) 1 MG tablet, Take 1 tablet (1,000 mcg total) by mouth daily., Disp: 90 tablet, Rfl: 3    ibuprofen (ADVIL,MOTRIN) 200 MG tablet, Take 3 tablets (600 mg total) by mouth daily as needed for pain., Disp: , Rfl:     lancets Misc, Dispense 100 lancets, ok to sub any brand preferred by insurance/patient, use 3x/day, Disp: 100 each, Rfl: 12    levothyroxine (SYNTHROID) 125 MCG tablet, Take 3 tablets (375 mcg total) by mouth daily., Disp: 270 tablet, Rfl: 3 ofloxacin (OCUFLOX) 0.3 % ophthalmic solution, Instill 4 drops in left ear twice daily for 5 days, Disp: 5 mL, Rfl: 3    ondansetron (ZOFRAN) 8 MG tablet, Take 1 tablet (8 mg total) by mouth every eight (8) hours as needed for nausea., Disp: 30 tablet, Rfl: 1    sulfaSALAzine (AZULFIDINE) 500 mg tablet, Take 2 tablets (1,000 mg total) by mouth two (2) times a day., Disp: 360 tablet, Rfl: 3    Allergies  Compazine [prochlorperazine] and Coconut    Family History  Family History   Problem Relation Age of Onset    Hyperthyroidism Mother     Osteoporosis Mother     Arrhythmia Mother     Squamous cell carcinoma Mother         basal cell vs squamous cell skin cancer    Arthritis Mother     Cancer Mother     Coronary artery disease Father         s/p CABG    Diabetes Father     Hypertension Father     Prostate cancer Father     Angina Father     Cancer Father     Heart disease Father     Colon cancer Paternal Grandmother     Diabetes Paternal Grandmother     Diabetes Maternal Aunt     Diabetes Paternal Uncle     Diabetes Paternal Aunt     Thyroid disease Neg Hx        Social History  Social History     Tobacco Use    Smoking status: Never     Passive exposure: Never    Smokeless tobacco: Never   Vaping Use    Vaping status: Never Used   Substance Use Topics    Alcohol use: Yes     Comment: social drinker 3 times yearly    Drug use: No        Physical Exam     VITAL SIGNS:      Vitals:    03/24/23 1952 03/24/23 2002 03/24/23 2156 03/24/23 2200  BP:  158/100 128/86 116/77   Pulse: 105 99 90 92   Resp:  20 14 21    Temp:  36.9 ??C (98.4 ??F)     TempSrc:  Oral     SpO2: 97% 96% 97% 100%   Weight:  (!) 140.3 kg (309 lb 4.9 oz)     Height:  188 cm (6' 2)         Constitutional: Alert and oriented. No acute distress.  Eyes: Conjunctivae are normal.  HEENT: Normocephalic and atraumatic. Conjunctivae clear. No congestion. Moist mucous membranes.   Cardiovascular: Rate as above, regular rhythm. Normal and symmetric distal pulses. Brisk capillary refill. Normal skin turgor.  Respiratory: Normal respiratory effort. Breath sounds are normal. There are no wheezing or crackles heard.  Gastrointestinal: Soft, non-distended, non-tender.  Genitourinary: Deferred.  Musculoskeletal: Non-tender with normal range of motion in all extremities.  Neurologic: Normal speech and language. No gross focal neurologic deficits are appreciated. Patient is moving all extremities equally, face is symmetric at rest and with speech.  Skin: Skin is warm, dry and intact. No rash noted.  Psychiatric: Mood and affect are normal. Speech and behavior are normal.     Radiology     CT head WO contrast   Final Result   Redemonstrated right frontal mass consistent with patient's known metastatic melanoma with slightly increased size now measuring up to 4.0 cm previously 1.8 cm. Slightly increased right to left midline shift. A component of intracranial hemorrhage cannot be excluded given the background high attenuation of the melanoma lesions. Further evaluation with MRI can be obtained as clinically dictated.                         Pertinent labs & imaging results that were available during my care of the patient were independently interpreted by me and considered in my medical decision making (see chart for details).    Portions of this record have been created using Scientist, clinical (histocompatibility and immunogenetics). Dictation errors have been sought, but may not have been identified and corrected.        Ladon Applebaum, MD  Resident  03/24/23 [phone]

## 2023-03-24 NOTE — Unmapped (Signed)
 ED Progress Note    Signout for this patient at the start of my shift.  In short, this is a 48 y.o. 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     CT head obtained upon presentation and showed increased size of known brain metastasis with slightly worse right to left midline shift.    Neurosurgery consulted.  No acute neurosurgical intervention indicated at this time.    ED Course as of 03/24/23 2353   Mon Mar 24, 2023   2326 VSS. Lab work overall reassuring and stable from prior.  Plan to treat nausea with Zofran and reassess symptoms.  Plan to continue discharge to discuss plan with neurosurgery.   2352 Plan for admission to neurosurgery.        Sol Passer, MD  Anesthesiology PGY-1

## 2023-03-24 NOTE — Unmapped (Unsigned)
 ***Pended note. Do not use for patient care until signed.***    Radiation Oncology Follow Up Visit Note    Patient Name: Kenneth Fitzgerald  Patient Age: 48 y.o.  Encounter Date: 04/07/2023    Referring Physician:   Kandis Mannan, PhD  [address]  [id number] Physician Office  Building  [place],  [place] [address]    Primary Care Provider:  Rosine Beat, Benison Pap, DO    Diagnoses:  No diagnosis found.    Treatment Site:   Right neck, 240 cGy x 20 fractions = 4800 cGy, completed 07/09/2013     Whole brain, 300 cGy x 10 fractions = 3000 cGy, completed 01/20/2020     2 left frontal lesions and right cingulate gyrus lesion, 2000 cGy x 1 fractions = 2000 cGy, completed 05/07/22    Interval Since Completion of Treatment: 11 months    Assessment:   Skylur Fuston is a 48 y.o. 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     He initially had a Stage III scalp melanoma in 2012, pT2pN2, s/p WLE and 3/3 positive SLN's, completion right level 2 neck dissection on 11/06/10 (0/25), one year of interferon completed 01/2012, and then right neck dissection 03/29/13 showing 1+/20 nodes (1cm with no ECE).      He also has multiply recurrent thyroid cancer in the same right neck dissection and is s/p RAI on 05/21/13.  Completed post-op EBRT to right neck to 48 Gy 07/09/13.     He has had stable extracranial disease on dabrafenib/trametinib and was noted to have multiple punctate lesions (best seen on T1 noncontrast sequence) in the brain suspicious for metastasis that have been increasing in number since January 2020.  He completed WBRT to 30 Gy on 01/20/20.     MRI 04/27/22 demonstrated growth of 3 lesions, he is now s/p radiation to 3 progressive mets as above completed 05/07/22.    MRI Brain today was personally reviewed and reviewed and discussed with Dr Flonnie Hailstone, demonstrating ***      Plan:     -Disease Status: ***     -Care Plan: ***     -Toxicity/Adverse Effects: ***     -Symptoms management: *** -Systemic therapy: ***     -Follow-up: ***      Interval History:  Mr. Encina returns today for a regularly scheduled follow-up, he was last seen in clinic ***.     ***      Review of Systems: All other systems reviewed are negative. Pertinent positives and negatives are above in interval history.    Past Medical, Surgical, Family and Social Histories reviewed and updated in the electronic medical record.    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)   12/22/2019 Initial Diagnosis    Malignant neoplasm of brain, unspecified location (CMS-HCC)     12/22/2019 - 03/02/2022 Radiation    Radiation Therapy Treatment Details (12/22/2019 - 03/02/2022)  Site: Brain  Technique: IMRT  Goal: No  goal specified  Planned Treatment Start Date: No planned start date specified     04/29/2022 -  Radiation    Radiation Therapy Treatment Details (Noted on 04/29/2022)  Site: Not Applicable Brain  Technique: SRS  Goal: No goal specified  Planned Treatment Start Date: No planned start date specified           Physical Exam:  Vital Signs for this encounter:  There were no vitals taken for this visit.  Last weight:    Wt Readings from Last 4 Encounters:   02/18/23 (!) 144 kg (317 lb 6.4 oz)   02/12/23 (!) 145.2 kg (320 lb 1.7 oz)   02/04/23 (!) 145.6 kg (321 lb)   01/30/23 (!) 146.1 kg (322 lb)     Karnofsky/Lansky Performance Status: {Karnofsky/Lansky Performance Status:22908}  General:   {Blank multiple:19196::No acute distress, alert and oriented X 4}   HEENT:  {Blank multiple UJWJX:91478}  Neck/lymphatics:  {Blank multiple [id number]  Cardiovascular:  {Blank multiple Cv:48791}  Respiratory:  {Blank multiple resp:48792}  Extremities:  {Blank multiple GNF:62130}  Neuro:   {Blank multiple neuro:48795}       Radiology  ***      Electronically signed by:    Sydnee Levans, PA-C  Department of Radiation Oncology  [facility]  [address], [id number]  [place], [place] [address]  O: [phone]  03/24/23 10:52 AM

## 2023-03-24 NOTE — Unmapped (Signed)
 Brief Radiation Oncology Telephone Note    Patient: Kenneth Fitzgerald  DOB: October 05, 1975  MRN: [id number]    Interval history:   [name] is a 48 year old male with a past medical history of recurrent thyroid cancer treated with RAI and stage III scalp melanoma treated with resection in 2012, R neck dissection and EBRT in 2015, with brain metastases treated with WBRT in 2022 and subsequently SRS to 3 lesions in April 2024.  Recently had hemorrhage of disease in the right frontal lobe his last follow-up in January 2025, at that visit he was provided a 3-week taper of dexamethasone which he completed in February 2025.    Received page that [name] had reached out to our office and was requesting a call back.  I called the patient at [phone] and Kenneth Fitzgerald informed me that he was calling due to a recent fall that he sustained and some subsequent headache and vomiting.  He reported that that last Thursday (03/20/2023) he tripped over his dog at home and he fell forwards and hit the front left of his head on a door in front of him.  He denies losing consciousness before during the fall. Since that time he has had a headache, constantly 6/10, located all over my head, without any clear aggravating or relieving factors.  Notes that he has been advised not to use Tylenol or ibuprofen and so he has not tried using anything over-the-counter to improve his headache. He has been experiencing vomiting, about 6-8 times last night and about 6 more times today. This just began yesterday and is not associated with vomiting. He describes the emesis and being clear, greenish-brownish liquid without food particles or blood.  He denies vision changes, new numbness or tingling, or focal weakness. Denies any sick contacts.  Notes that he is currently in the car with his sister, and RN, who is bringing him to the ED at this moment and he wanted to be sure if this was warranted.    Assessment:  Discussed with [name] that his continual headache and recent development of emesis without associated nausea or sick contact since his fall is concerning that he may be experiencing further intracranial bleeding.  Agreed with proceeding with presentation to the ED to undergo formal evaluation and intracranial imaging.  Additionally noted that I would update his primary radiation oncologist Dr. Flonnie Hailstone.  [name] and his sister verbalized their understanding and agreement with this plan.    Plan:  -Advised patient to present to the ED for formal evaluation and intracranial imaging.  - Will update [name] primary radiation oncologist, Dr. Flonnie Hailstone, of this recent development    Electronically Signed by:  Vicenta Aly, MD  Radiation Oncology PGY-2

## 2023-03-25 LAB — COMPREHENSIVE METABOLIC PANEL
ALBUMIN: 4.7 g/dL (ref 3.4–5.0)
ALKALINE PHOSPHATASE: 100 U/L (ref 46–116)
ALT (SGPT): 53 U/L — ABNORMAL HIGH (ref 10–49)
ANION GAP: 15 mmol/L — ABNORMAL HIGH (ref 5–14)
AST (SGOT): 40 U/L — ABNORMAL HIGH (ref <=34)
BILIRUBIN TOTAL: 0.8 mg/dL (ref 0.3–1.2)
BLOOD UREA NITROGEN: 21 mg/dL (ref 9–23)
BUN / CREAT RATIO: 21
CALCIUM: 8.7 mg/dL (ref 8.7–10.4)
CHLORIDE: 96 mmol/L — ABNORMAL LOW (ref 98–107)
CO2: 27 mmol/L (ref 20.0–31.0)
CREATININE: 0.99 mg/dL (ref 0.73–1.18)
EGFR CKD-EPI (2021) MALE: >90 mL/min/{1.73_m2} (ref >=60)
GLUCOSE RANDOM: 144 mg/dL (ref 70–179)
POTASSIUM: 3.7 mmol/L (ref 3.4–4.8)
PROTEIN TOTAL: 8 g/dL (ref 5.7–8.2)
SODIUM: 138 mmol/L (ref 135–145)

## 2023-03-25 LAB — CBC
HEMATOCRIT: 37.5 % — ABNORMAL LOW (ref 39.0–48.0)
HEMOGLOBIN: 12.9 g/dL (ref 12.9–16.5)
MEAN CORPUSCULAR HEMOGLOBIN CONC: 34.4 g/dL (ref 32.0–36.0)
MEAN CORPUSCULAR HEMOGLOBIN: 30.3 pg (ref 25.9–32.4)
MEAN CORPUSCULAR VOLUME: 88 fL (ref 77.6–95.7)
MEAN PLATELET VOLUME: 7.6 fL (ref 6.8–10.7)
PLATELET COUNT: 221 10*9/L (ref 150–450)
RED BLOOD CELL COUNT: 4.26 10*12/L (ref 4.26–5.60)
RED CELL DISTRIBUTION WIDTH: 17.3 % — ABNORMAL HIGH (ref 12.2–15.2)
WBC ADJUSTED: 8.5 10*9/L (ref 3.6–11.2)

## 2023-03-25 LAB — APTT
APTT: 26.9 s (ref 24.8–38.4)
HEPARIN CORRELATION: 0.2

## 2023-03-25 LAB — PROTIME-INR
INR: 1.08
PROTIME: 12.3 s (ref 9.9–12.6)

## 2023-03-25 MED ORDER — ONDANSETRON 4 MG DISINTEGRATING TABLET
ORAL_TABLET | Freq: Three times a day (TID) | 0 refills | 10.00 days | Status: CN | PRN
Start: 2023-03-25 — End: 2023-04-01

## 2023-03-25 MED ORDER — OXYCODONE 5 MG TABLET
ORAL_TABLET | ORAL | 0 refills | 5.00 days | Status: CP | PRN
Start: 2023-03-25 — End: 2023-03-30

## 2023-03-25 MED ORDER — DEXAMETHASONE 4 MG TABLET
ORAL_TABLET | Freq: Two times a day (BID) | ORAL | 0 refills | 7.00 days | Status: CP
Start: 2023-03-25 — End: 2023-03-25

## 2023-03-25 MED ADMIN — dexAMETHasone (DECADRON) tablet 4 mg: 4 mg | ORAL | @ 04:00:00 | Stop: 2023-03-25

## 2023-03-25 MED ADMIN — dexAMETHasone (DECADRON) 4 mg/mL injection 4 mg: 4 mg | INTRAVENOUS | @ 18:00:00 | Stop: 2023-03-25

## 2023-03-25 MED ADMIN — ondansetron (ZOFRAN) injection 8 mg: 8 mg | INTRAVENOUS | @ 03:00:00 | Stop: 2023-03-24

## 2023-03-25 MED ADMIN — ondansetron (ZOFRAN) injection 4 mg: 4 mg | INTRAVENOUS | @ 21:00:00 | Stop: 2023-03-25

## 2023-03-25 MED ADMIN — ezetimibe (ZETIA) tablet 10 mg: 10 mg | ORAL | @ 13:00:00 | Stop: 2023-03-25

## 2023-03-25 MED ADMIN — pantoprazole (Protonix) EC tablet 40 mg: 40 mg | ORAL | @ 13:00:00 | Stop: 2023-03-25

## 2023-03-25 MED ADMIN — acetaminophen (TYLENOL) tablet 1,000 mg: 1000 mg | ORAL | @ 03:00:00 | Stop: 2023-03-24

## 2023-03-25 MED ADMIN — calcitriol (ROCALTROL) capsule 0.25 mcg: .25 ug | ORAL | @ 13:00:00 | Stop: 2023-03-25

## 2023-03-25 MED ADMIN — levothyroxine (SYNTHROID) tablet 375 mcg: 375 ug | ORAL | @ 10:00:00 | Stop: 2023-03-25

## 2023-03-25 MED ADMIN — gadopiclenol (ELUCIREM,VUEWAY) injection 10 mL: 10 mL | INTRAVENOUS | @ 08:00:00 | Stop: 2023-03-25

## 2023-03-25 MED ADMIN — sodium chloride 0.9% (NS) bolus 1,000 mL: 1000 mL | INTRAVENOUS | @ 11:00:00 | Stop: 2023-03-25

## 2023-03-25 MED ADMIN — dexAMETHasone (DECADRON) tablet 4 mg: 4 mg | ORAL | @ 13:00:00 | Stop: 2023-03-25

## 2023-03-25 MED ADMIN — ondansetron (ZOFRAN) injection 4 mg: 4 mg | INTRAVENOUS | @ 13:00:00 | Stop: 2023-03-25

## 2023-03-25 MED ADMIN — sulfaSALAzine (AZULFIDINE) tablet 1,000 mg: 1000 mg | ORAL | @ 13:00:00 | Stop: 2023-03-25

## 2023-03-25 NOTE — Unmapped (Signed)
 Bed: 79-D  Expected date:   Expected time:   Means of arrival:   Comments:  Sheretha Shadd 1B

## 2023-03-25 NOTE — Unmapped (Signed)
 [facility]   INPATIENT PROGRESS NOTE  Attending of Record: Salli Quarry, MD     For questions/concerns regarding patients:  Mon-Fri 6 AM-6 PM, please page Cranial Neurosurgery Service at [phone] (ICU) OR [phone] (Floor/ISCU)  Sun-Thurs 6 PM-6 AM AND weekends/holidays, please page Neurosurgery Night Float/ Inpatient Weekend Service        Brief History of Present Illness  Kenneth Fitzgerald is a 48 y.o. 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    Subjective/Interval History  HA improved, no vomiting overnight.     Interval Imaging Reviewed  MRI brain wwo 3/11 personally reviewed with interval slight increase in most lesions including R frontal     Neurological Assessment and Plan  **Metastasis to brain (CMS-HCC)  ** HD2  - decadron 4mg  BID  - no surgery now  - continue conservative management with braftovi  - can dc when pain controlled  - rad onc to see to discuss possible gamma tiles in future      **Medication-induced coagulopathy requiring further treatment   - Anticoagulant/Antiplatelet: none      Daily Maintenance:  VTE chemoprophylaxis: none  IV Fluids: 1L NS bolus  Bowel Regimen: senna  Foley status: none  Nutrition: reg    Disposition: floor    Anticipated Follow up Plan after Discharge: TBD    ___________________________________________________________________      Neurological Exam  EOSp  AOX3  PERRL  EOMI  FS  TML  5/5 x4  SILT  Very slight L pronator drift     The patient's vitals, intake/output, labs, orders, and relevant imaging were reviewed for the last 24 hours.    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

## 2023-03-25 NOTE — Unmapped (Signed)
 Care Management  Initial Transition Planning Assessment              General  Care Manager assessed the patient by : Telephone conversation with patient, Medical record review, Discussion with Clinical Care team  Orientation Level: Oriented X4  Functional level prior to admission: Independent  Reason for referral: Discharge Planning    Contact/Decision Maker  Extended Emergency Contact Information  Primary Emergency Contact: Huntley,Tammy  Mobile Phone: [phone]  Relation: Sister  Preferred language: ENGLISH  Interpreter needed? No  Secondary Emergency Contact: Emerick,Jay   [place]  Home Phone: [phone]  Relation: Brother    Armed forces operational officer Next of Kin / Guardian / POA / Advance Directives     HCDM (With Legal Document To Support): Huntley,Tammy - Sister - [phone]    Advance Directive (Medical Treatment)  Does patient have an advance directive covering medical treatment?: Patient does not have advance directive covering medical treatment.  Reason patient does not have an advance directive covering medical treatment:: Patient does not wish to complete one at this time.              Readmission Information                                     Did the following happen with your discharge?                                                     Patient Information  Lives with: Alone    Type of Residence: Private residence        Location/Detail: [address] [place] [address]    Support Systems/Concerns: Family Members    Responsibilities/Dependents at home?: No    Home Care services in place prior to admission?: No                  Equipment Currently Used at Home: none       Currently receiving outpatient dialysis?: No       Financial Information       Need for financial assistance?: No       Social Determinants of Health  Social Determinants of Health were addressed in provider documentation.  Please refer to patient history.  Social Drivers of Health     Food Insecurity: No Food Insecurity (03/25/2023)    Hunger Vital Sign     Worried About Running Out of Food in the Last Year: Never true     Ran Out of Food in the Last Year: Never true   Tobacco Use: Low Risk  (03/24/2023)    Patient History     Smoking Tobacco Use: Never     Smokeless Tobacco Use: Never     Passive Exposure: Never   Transportation Needs: No Transportation Needs (03/25/2023)    PRAPARE - Transportation     Lack of Transportation (Medical): No     Lack of Transportation (Non-Medical): No   Alcohol Use: Not At Risk (11/05/2021)    Alcohol Use     How often do you have a drink containing alcohol?: Monthly or less     How many drinks containing alcohol do you have on a typical day when you are drinking?: 1 -  2     How often do you have 5 or more drinks on one occasion?: Never   Housing: Low Risk  (03/25/2023)    Housing     Within the past 12 months, have you ever stayed: outside, in a car, in a tent, in an overnight shelter, or temporarily in someone else's home (i.e. couch-surfing)?: No     Are you worried about losing your housing?: No   Physical Activity: Inactive (11/05/2021)    Exercise Vital Sign     Days of Exercise per Week: 0 days     Minutes of Exercise per Session: 0 min   Utilities: Not on file   Stress: Stress Concern Present (11/05/2021)    [employer] of Occupational Health - Occupational Stress Questionnaire     Feeling of Stress : To some extent   Interpersonal Safety: Not At Risk (11/05/2021)    Interpersonal Safety     Unsafe Where You Currently Live: No     Physically Hurt by Anyone: No     Abused by Anyone: No   Substance Use: Not on file (11/19/2022)   Intimate Partner Violence: Not At Risk (11/05/2021)    Humiliation, Afraid, Rape, and Kick questionnaire     Fear of Current or Ex-Partner: No     Emotionally Abused: No     Physically Abused: No     Sexually Abused: No   Social Connections: Moderately Isolated (11/05/2021)    Social Connection and Isolation Panel [NHANES]     Frequency of Communication with Friends and Family: More than three times a week     Frequency of Social Gatherings with Friends and Family: Once a week     Attends Religious Services: More than 4 times per year     Active Member of [employer] or Organizations: No     Attends Banker Meetings: Never     Marital Status: Never married   Programmer, applications: Low Risk  (03/25/2023)    Overall Financial Resource Strain (CARDIA)     Difficulty of Paying Living Expenses: Not hard at all   Depression: Not at risk (02/25/2023)    Received from [facility]    PHQ-2     PHQ-2 Total Score: 2   Internet Connectivity: No Internet connectivity concern identified (11/05/2021)    Internet Connectivity     Do you have access to internet services: Yes     How do you connect to the internet: Personal Device at home     Is your internet connection strong enough for you to watch video on your device without major problems?: Yes     Do you have enough data to get through the month?: Yes     Does at least one of the devices have a camera that you can use for video chat?: Yes   Health Literacy: Low Risk  (11/05/2021)    Health Literacy     : Never       Complex Discharge Information                                                                    Interventions:       Discharge Needs Assessment  Concerns to be Addressed:  Clinical Risk Factors: Multiple Diagnoses (Chronic), Lives Alone or Absence of Caregiver to Assist with Discharge and Home Care    Barriers to taking medications: No                   Anticipated Changes Related to Illness: other (see comments) (unable to determine at this time)    Equipment Needed After Discharge: other (see comments) (unable to determine at this time)    Discharge Facility/Level of Care Needs: other (see comments) (unable to determine at this time)    Readmission  Risk of Unplanned Readmission Score:  %  Predictive Model Details   No score data available for [facility] Risk of Unplanned Readmission     Readmitted Within the Last 30 Days? (No if blank)   Patient at risk for readmission?: N/A    Discharge Plan       Expected Discharge Date:     Expected Transfer from Critical Care:      Quality data for continuing care services shared with patient and/or representative?: N/A  Patient and/or family were provided with choice of facilities / services that are available and appropriate to meet post hospital care needs?: N/A       Initial Assessment complete?: Yes    Type of Residence: Mailing Address:  [address] [place] [address]  Contacts:    Patient Phone Number: [phone]  Extended Emergency Contact Information  Primary Emergency Contact: Huntley,Tammy  Mobile Phone: [phone]  Relation: Sister  Preferred language: ENGLISH  Interpreter needed? 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  Past Surgical History:   has a past surgical history that includes pr removal nodes, neck,cerv cmplt (Left, 12/07/2012); pr removal nodes, neck,cerv mod rad (Right, 03/29/2013); pr lap, ventral hernia repair,reducible (N/A, 04/07/2014); pr repair recurr incis hernia,reduc (N/A, 01/03/2015); pr implant mesh hernia repair/debridement closure (N/A, 01/03/2015); pr repair recurr incis hernia,strang (N/A, 06/21/2016); pr colonoscopy w/biopsy single/multiple (N/A, 01/09/2018); pr sigmoidoscopy,fine needl bx,us guided (N/A, 01/09/2018); pr colonoscopy w/biopsy single/multiple (N/A, 08/24/2019); pr colsc flx with directed submucosal njx any sbst (N/A, 08/24/2019); pr exploratory of abdomen (N/A, 01/24/2020); pr freeing bowel adhesion,enterolysis (N/A, 01/24/2020); pr repair recurr incis hernia,strang (N/A, 01/24/2020); pr negative pressure wound therapy dme >50 sq cm (N/A, 01/24/2020); pr colonoscopy flx dx w/collj spec when pfrmd (N/A, 08/29/2021); pr colonoscopy w/biopsy single/multiple (N/A, 08/29/2021); Colonoscopy; Cosmetic surgery; Colon surgery; and Thyroid surgery.   Previous admit date: 01/19/2020    Primary Insurance- Payor: BCBS / Plan: BCBS BLUE OPTIONS/PPO/ADV ([place] ONLY) / Product Type: *No Product type* /   Secondary Insurance - None  Prescription Coverage -   [phone] [place] [place] - [address]  [facility]  [facility]    Transportation home:  unable to determine at this time

## 2023-03-25 NOTE — Unmapped (Cosign Needed)
 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 Brain MRI showed increase in metastatic lesions. He was started on high dose dexamethasone. He was seen by Radiation Oncology.  His symptoms improved and he was set up to return to Neurosurgery clinic next week.     His diet was slowly advanced and at the time of discharge he was tolerating a regular diet. The patient was able to void spontaneously, have his pain controlled with P.O. pain medication, and returned to baseline functional status.     Discharge Day Services:   The patient was seen and evaluated by the Neurosurgery team on the day of discharge and deemed stable for discharge, including stable labs, vital signs, radiographic studies, and neurologic exam.  Discharge instructions were given and explained.  Questions were answered.    Physical Exam:  General: No acute distress  Cardiovascular: Hemodynamically stable  Pulmonary: Breathing is unlabored  Neurologic:   EOSp  AOX3  PERRL  EOMI  FS  TML  5/5 x4  SILT  Very slight L pronator drift     _____________________________________________________________________________    Temp:  [36.9 ??C (98.4 ??F)-37.1 ??C (98.7 ??F)] 37.1 ??C (98.7 ??F)  Heart Rate:  [60-105] 84  SpO2 Pulse:  [60-95] 90  Resp:  [14-23] 16  BP: (116-158)/(77-109) 153/95  MAP (mmHg):  [90-124] 112  SpO2:  [96 %-100 %] 98 %  BMI (Calculated):  [39.7] 39.7      Condition at Discharge: Stable  _____________________________________________________________________________  Discharge Medications:     Your Medication List        STOP taking these medications      ibuprofen 200 MG tablet  Commonly known as: ADVIL,MOTRIN            START taking these medications      oxyCODONE 5 MG immediate release tablet  Commonly known as: ROXICODONE  Take 1 tablet (5 mg total) by mouth every four (4) hours as needed for up to 5 days.     pantoprazole 40 MG tablet  Commonly known as: Protonix  Take 1 tablet (40 mg total) by mouth every morning.  Start taking on: March 26, 2023            CHANGE how you take these medications      dexAMETHasone 4 MG tablet  Commonly known as: DECADRON  Take 2 tablets (8 mg total) by mouth every twelve (12) hours for 7 days.  What changed:   medication strength  See the new instructions.            CONTINUE taking these medications      acetaminophen 500 MG tablet  Commonly known as: TYLENOL  Take 2 tablets (1,000 mg total) by mouth two (2) times a day.     ALLEGRA ALLERGY 180 mg tablet  Generic drug: fexofenadine  Take 1 tablet (180 mg total) by mouth daily  as needed (allergies).     blood sugar diagnostic Strp  Dispense 100 blood glucose test strips, ok to sub any brand preferred by insurance/patient, use 3x/day; dispense whatever brand matches with meter.     blood-glucose meter kit  Use as instructed; dispense 1 meter, whatever is preferred by insurance     BRAFTOVI 75 mg capsule  Generic drug: encorafenib  Take 6 capsules (450 mg total) by mouth daily.     calcitriol 0.25 MCG capsule  Commonly known as: ROCALTROL  Take 1 capsule (0.25 mcg total) by mouth daily.     DULoxetine 30 MG capsule  Commonly known as: CYMBALTA  Take 1 capsule (30 mg total) by mouth two (2) times a day.     ezetimibe 10 mg tablet  Commonly known as: ZETIA  Take 1 tablet (10 mg total) by mouth daily.     fluticasone propionate 50 mcg/actuation nasal spray  Commonly known as: FLONASE  1 spray into each nostril daily.     folic acid 1 MG tablet  Commonly known as: FOLVITE  Take 1 tablet (1,000 mcg total) by mouth daily.     lancets Misc  Dispense 100 lancets, ok to sub any brand preferred by insurance/patient, use 3x/day     [name] 15 mg tablet  Generic drug: binimetinib  Take 3 tablets (45 mg total) by mouth two (2) times a day.     ondansetron 8 MG tablet  Commonly known as: ZOFRAN  Take 1 tablet (8 mg total) by mouth every eight (8) hours as needed for nausea.     sulfaSALAzine 500 mg tablet  Commonly known as: AZULFIDINE  Take 2 tablets (1,000 mg total) by mouth two (2) times a day.     SYNTHROID 125 mcg tablet  Generic drug: levothyroxine  Take 3 tablets (375 mcg total) by mouth daily.            _____________________________________________________________________________  Pending Test Results (if blank, then none):      Most Recent Labs:  Microbiology Results (last day)       ** No results found for the last 24 hours. **            Lab Results   Component Value Date    WBC 8.5 03/25/2023    HGB 12.9 03/25/2023    HCT 37.5 (L) 03/25/2023    PLT 221 03/25/2023       Lab Results   Component Value Date    NA 138 03/25/2023    K 3.7 03/25/2023    CL 96 (L) 03/25/2023    CO2 27.0 03/25/2023    BUN 21 03/25/2023    CREATININE 0.99 03/25/2023    CALCIUM 8.7 03/25/2023    MG 1.8 05/30/2022    PHOS 4.4 10/07/2022       _____________________________________________________________________________  Discharge Instructions:   Activity Instructions       Discharge activity      You were admitted to [facility] Neurosurgery for brain tumors.   You were given high dose steroids to take twice daily until your clinic visit next week.     Activity:  No restrictions    Pain:   You may have been prescribed a narcotic pain medication. Do not drive or make critical/important decisions while taking this medication. Narcotic medications may cause constipation. Ensure you have adequate (>25 grams/day) of fiber in your diet and drink at least 64 oz. of water daily. You may also wish to take an over  the counter stool softener once or twice daily as needed for constipation while taking narcotic pain medication.    Diet:  You should eat very low carb/ low sugar diet while taking steroids, and check your blood sugar at home at least twice a day.    Follow up:   We will request an appointment for you for next Tuesday 3/18 with Dr. Janeece Agee    Things to look out for:  -Pain- please call the Neurosurgery resident on-call for any worsening pain unrelieved with prescribed medications.  Seek care immediately or call 911 if:  -Muscle strength- If an arm or leg becomes weak,   -Bowel and Bladder- If you have new loss of bowel or bladder control, report straight to the emergency room.  -Mental status- If you or your family notice a change in your mental state from your normal, go straight to the emergency room. A change in mental status includes: increased confusion, trouble waking up, blurry/double vision, severe uncontrolled headaches, vomiting, loss of control of one side of your body, seizures, or other concerning issues.    Important Numbers:  -Neurosurgery resident on-call 24/7: Call [phone] for the [facility] operator.  Ask to speak with the Neurosurgery resident on-call.    Neurosurgery clinic [phone]                        Appointments which have been scheduled for you      Mar 31, 2023 3:00 PM  (Arrive by 2:45 PM)  RETURN  ENDOCRINE with Tripuraneni Jackquline Denmark, MD  [facility] DIABETES AND ENDOCRINOLOGY [place] [facility] REGION) [address]  [facility] 1 through 4  [place] [address]  [phone]        Apr 07, 2023 8:00 AM  (Arrive by 7:45 AM)  MRI Brain With and Without Contrast with HBR MRI MBL  IMG MRI [facility]) [address]  [place] [address]  [phone]   On appt date:  Bring recent lab work  Bring documentation of any metal object implants  Take meds as usual  Check w/physician if diabetic  You will be asked to change into a gown for your safety    On appt date do not:  No restrictions on food/drink  Wear metallic items including jewelry (we are not responsible for lost items)    Let us know if pt:  Claustrophobic  Metal object implant  Pregnant  Prescribed a sedative  On dialysis  Allergic to MRI dye/contrast  Kidney Failure    (Title:MRIWCNTRST)         Apr 07, 2023 9:30 AM  (Arrive by 9:00 AM)  FOLLOW UP 15 with Ruben Reason, MD  [facility] RADIATION ONCOLOGY [place] [facility] REGION) [address]  [place] [address]  [phone]        Apr 30, 2023 10:30 AM  (Arrive by 10:00 AM)  LAB ONLY [place] with ADULT ONC LAB  [facility] ADULT ONCOLOGY LAB DRAW STATION [place] [facility] REGION) [address]  [place] [address]  [phone]        Apr 30, 2023 11:30 AM  (Arrive by 11:00 AM)  RETURN LUNG ACTIVE [place] with [name], MD  [facility] ONCOLOGY MULTIDISCIPLINARY 2ND FLR CANCER HOSP [facility] REGION) [address]  [place] [address]  [phone]        Apr 30, 2023 12:20 PM  (  Arrive by 12:05 PM)  CT CHEST W CONTRAST with IC CT RM 1  RAD [facility] ROAD [facility] - [facility]) [address] [facility] [address]  1st Floor  [place] [address]  [phone]   On appt date:  Drink lots of water 24 hrs  Bring recent lab work  Take meds as usual  [employer] fast streamer of current meds  Bring snack if diabetic    Let us know if pt:  Allergic to contrast dyes  Diabetic  Pregnant or nursing  Claustrophobic    (Title:CTWCNTRST)         Apr 30, 2023 12:45 PM  (Arrive by 12:30 PM)  MRI Brain With and Without Contrast with IC MRI MBL 1  IMG MRI IMAGING CENTER [facility] - Imaging [facility]ine Center) [address]  1st Floor  [place] [address]  [phone]   On appt date:  Bring recent lab work  Bring documentation of any metal object implants  Take meds as usual  Check w/physician if diabetic  You will be asked to change into a gown for your safety    On appt date do not:  No restrictions on food/drink  Wear metallic items including jewelry (we are not responsible for lost items)    Let us know if pt:  Claustrophobic  Metal object implant  Pregnant  Prescribed a sedative  On dialysis  Allergic to MRI dye/contrast  Kidney Failure    (Title:MRIWCNTRST)         Jun 03, 2023 10:20 AM  (Arrive by 10:05 AM)  Physical with Benison Pap Mangel, DO  [facility] PRIMARY CARE [address] AT [facility] REGION) [address] [place]  [place] [address]  [phone]   Arrive 15 minutes prior to appointment.         Jul 24, 2023 1:30 PM  (Arrive by 1:15 PM)  RETURN ENT with Adam [place] Kimple, MD  [facility] [place]OLARYNGOLOGY [place] [facility] REGION) [address]  [address]  [place] [address]  [phone]                 I spent 65 minutes in activities related to discharge for this patient

## 2023-03-25 NOTE — Unmapped (Signed)
 Please schedule this patient for an appointment as noted below.  Please contact the patient as needed/appropriate to alert them of this appointment.    Provider: Salli Quarry, MD and Mina Marble, MD (multi-D)    Date or Time Frame: 1 week (3/18)    Reason for Visit: melanoma    Imaging Needed: none

## 2023-03-26 ENCOUNTER — Ambulatory Visit
Admit: 2023-03-26 | Discharge: 2023-03-28 | Disposition: A | Discharge status: Home / Self Care | Payer: BLUE CROSS/BLUE SHIELD

## 2023-03-26 ENCOUNTER — Encounter
Admit: 2023-03-26 | Discharge: 2023-03-28 | Disposition: A | Discharge status: Home / Self Care | Payer: BLUE CROSS/BLUE SHIELD

## 2023-03-26 ENCOUNTER — Inpatient Hospital Stay
Admit: 2023-03-26 | Discharge: 2023-03-28 | Disposition: A | Discharge status: Home / Self Care | Payer: BLUE CROSS/BLUE SHIELD

## 2023-03-26 DIAGNOSIS — C439 Malignant melanoma of skin, unspecified: Principal | ICD-10-CM

## 2023-03-26 LAB — COMPREHENSIVE METABOLIC PANEL
ALBUMIN: 4.9 g/dL (ref 3.4–5.0)
ALKALINE PHOSPHATASE: 104 U/L (ref 46–116)
ALT (SGPT): 107 U/L — ABNORMAL HIGH (ref 10–49)
ANION GAP: 20 mmol/L — ABNORMAL HIGH (ref 5–14)
AST (SGOT): 73 U/L — ABNORMAL HIGH (ref <=34)
BILIRUBIN TOTAL: 0.8 mg/dL (ref 0.3–1.2)
BLOOD UREA NITROGEN: 21 mg/dL (ref 9–23)
BUN / CREAT RATIO: 19
CALCIUM: 8.7 mg/dL (ref 8.7–10.4)
CHLORIDE: 99 mmol/L (ref 98–107)
CO2: 22 mmol/L (ref 20.0–31.0)
CREATININE: 1.09 mg/dL (ref 0.73–1.18)
EGFR CKD-EPI (2021) MALE: 84 mL/min/{1.73_m2} (ref >=60)
GLUCOSE RANDOM: 120 mg/dL (ref 70–179)
POTASSIUM: 3.5 mmol/L (ref 3.4–4.8)
PROTEIN TOTAL: 8.2 g/dL (ref 5.7–8.2)
SODIUM: 141 mmol/L (ref 135–145)

## 2023-03-26 LAB — CBC W/ AUTO DIFF
BASOPHILS ABSOLUTE COUNT: 0 10*9/L (ref 0.0–0.1)
BASOPHILS RELATIVE PERCENT: 0 %
EOSINOPHILS ABSOLUTE COUNT: 0.1 10*9/L (ref 0.0–0.5)
EOSINOPHILS RELATIVE PERCENT: 0.9 %
HEMATOCRIT: 44.5 % (ref 39.0–48.0)
HEMOGLOBIN: 15.1 g/dL (ref 12.9–16.5)
LYMPHOCYTES ABSOLUTE COUNT: 0.5 10*9/L — ABNORMAL LOW (ref 1.1–3.6)
LYMPHOCYTES RELATIVE PERCENT: 4.7 %
MEAN CORPUSCULAR HEMOGLOBIN CONC: 34 g/dL (ref 32.0–36.0)
MEAN CORPUSCULAR HEMOGLOBIN: 30 pg (ref 25.9–32.4)
MEAN CORPUSCULAR VOLUME: 88.1 fL (ref 77.6–95.7)
MEAN PLATELET VOLUME: 8 fL (ref 6.8–10.7)
MONOCYTES ABSOLUTE COUNT: 0.9 10*9/L — ABNORMAL HIGH (ref 0.3–0.8)
MONOCYTES RELATIVE PERCENT: 8 %
NEUTROPHILS ABSOLUTE COUNT: 9.9 10*9/L — ABNORMAL HIGH (ref 1.8–7.8)
NEUTROPHILS RELATIVE PERCENT: 86.4 %
PLATELET COUNT: 225 10*9/L (ref 150–450)
RED BLOOD CELL COUNT: 5.05 10*12/L (ref 4.26–5.60)
RED CELL DISTRIBUTION WIDTH: 17.2 % — ABNORMAL HIGH (ref 12.2–15.2)
WBC ADJUSTED: 11.4 10*9/L — ABNORMAL HIGH (ref 3.6–11.2)

## 2023-03-26 MED ORDER — PANTOPRAZOLE 40 MG TABLET,DELAYED RELEASE
ORAL_TABLET | Freq: Every morning | ORAL | 0 refills | 30.00 days | Status: CP
Start: 2023-03-26 — End: 2023-04-25
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
## 2023-03-26 NOTE — Unmapped (Signed)
 [facility] Other Call ONC Phone Room Smart Lists: General -     Hi,     Tammy  contacted the Communication Center regarding the following:    - Tammy stated she is calling to speak to Dr. Flonnie Hailstone in regards to patient's treatment plan and recurring symptoms    Please contact Tammy   at   [phone]     .    Thanks in advance,    Warnell Bureau  [facility]   [phone]    [facility]

## 2023-03-27 LAB — PROTIME-INR
INR: 1.18
PROTIME: 13.5 s — ABNORMAL HIGH (ref 9.9–12.6)

## 2023-03-27 LAB — URINALYSIS WITH MICROSCOPY WITH CULTURE REFLEX PERFORMABLE
BILIRUBIN UA: NEGATIVE
GLUCOSE UA: NEGATIVE
KETONES UA: NEGATIVE
LEUKOCYTE ESTERASE UA: NEGATIVE
NITRITE UA: NEGATIVE
PH UA: 5.5 (ref 5.0–9.0)
RBC UA: 2 /HPF (ref <=3)
SPECIFIC GRAVITY UA: 1.034 — ABNORMAL HIGH (ref 1.003–1.030)
SQUAMOUS EPITHELIAL: <1 /HPF (ref 0–5)
UROBILINOGEN UA: <2
WBC UA: 1 /HPF (ref <=2)

## 2023-03-27 LAB — APTT
APTT: 26 s (ref 24.8–38.4)
HEPARIN CORRELATION: <0.2

## 2023-03-27 MED ADMIN — morphine injection 2 mg: 2 mg | INTRAVENOUS | @ 07:00:00 | Stop: 2023-03-27

## 2023-03-27 MED ADMIN — dexAMETHasone (DECADRON) tablet 6 mg: 6 mg | ORAL | @ 13:00:00

## 2023-03-27 MED ADMIN — morphine injection 2 mg: 2 mg | INTRAVENOUS | @ 03:00:00 | Stop: 2023-03-26

## 2023-03-27 MED ADMIN — senna (SENOKOT) tablet 1 tablet: 1 | ORAL | @ 16:00:00

## 2023-03-27 MED ADMIN — acetaminophen (TYLENOL) tablet 650 mg: 650 mg | ORAL | @ 12:00:00

## 2023-03-27 MED ADMIN — zinc sulfate (ZINCATE) capsule 220 mg: 220 mg | ORAL | @ 17:00:00

## 2023-03-27 MED ADMIN — acetaminophen (TYLENOL) tablet 1,000 mg: 1000 mg | ORAL | @ 21:00:00

## 2023-03-27 MED ADMIN — oxyCODONE (ROXICODONE) immediate release tablet 5 mg: 5 mg | ORAL | Stop: 2023-04-10

## 2023-03-27 MED ADMIN — gadopiclenol (ELUCIREM,VUEWAY) injection 10 mL: 10 mL | INTRAVENOUS | @ 06:00:00 | Stop: 2023-03-27

## 2023-03-27 MED ADMIN — multivitamins, therapeutic with minerals tablet 1 tablet: 1 | ORAL | @ 16:00:00

## 2023-03-27 MED ADMIN — ascorbic acid (vitamin C) (VITAMIN C) tablet 250 mg: 250 mg | ORAL | @ 16:00:00

## 2023-03-27 MED ADMIN — levothyroxine (SYNTHROID) tablet 375 mcg: 375 ug | ORAL | @ 15:00:00
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
## 2023-03-27 NOTE — Unmapped (Signed)
 VSS. Patient is alert and oriented x4. Arrived to unit on stretcher and ambulated independently to bed. Headache, relieved with PRN pain medication. Denies any nausea. Oriented to room, call bell use, menu and ordering food. Patient is up ad lib. Patient in bed, call bell within reach, no needs noted at this time.    Problem: Adult Inpatient Plan of Care  Goal: Plan of Care Review  Outcome: Ongoing - Unchanged  Goal: Patient-Specific Goal (Individualized)  Outcome: Ongoing - Unchanged  Goal: Absence of Hospital-Acquired Illness or Injury  Outcome: Ongoing - Unchanged  Intervention: Identify and Manage Fall Risk  Recent Flowsheet Documentation  Taken 03/27/2023 1655 by Marton Redwood, RN  Safety Interventions:   fall reduction program maintained   lighting adjusted for tasks/safety   low bed   nonskid shoes/slippers when out of bed  Intervention: Prevent and Manage VTE (Venous Thromboembolism) Risk  Recent Flowsheet Documentation  Taken 03/27/2023 1655 by Marton Redwood, RN  Anti-Embolism Device Status: Off  Goal: Optimal Comfort and Wellbeing  Outcome: Ongoing - Unchanged  Goal: Readiness for Transition of Care  Outcome: Ongoing - Unchanged  Goal: Rounds/Family Conference  Outcome: Ongoing - Unchanged

## 2023-03-27 NOTE — Unmapped (Signed)
 Care Management  Initial Transition Planning Assessment              General  Care Manager assessed the patient by : Telephone conversation with patient, Medical record review, Discussion with Clinical Care team  Orientation Level: Oriented X4  Functional level prior to admission: Independent  Reason for referral: Discharge Planning    Contact/Decision Maker  Extended Emergency Contact Information  Primary Emergency Contact: Huntley,Tammy  Mobile Phone: [phone]  Relation: Sister  Preferred language: ENGLISH  Interpreter needed? No  Secondary Emergency Contact: Wadleigh,Jay   [place]  Home Phone: [phone]  Relation: Brother    Armed forces operational officer Next of Kin / Guardian / POA / Advance Directives     HCDM (With Legal Document To Support): Huntley,Tammy - Sister - [phone]    Advance Directive (Medical Treatment)  Does patient have an advance directive covering medical treatment?: Patient does not have advance directive covering medical treatment.  Reason patient does not have an advance directive covering medical treatment:: Patient does not wish to complete one at this time.              Readmission Information                                     Did the following happen with your discharge?                                                     Patient Information  Lives with: Alone    Type of Residence: Private residence        Location/Detail: [address] [place] [address]    Support Systems/Concerns: Family Members    Responsibilities/Dependents at home?: No    Home Care services in place prior to admission?: No                  Equipment Currently Used at Home: none       Currently receiving outpatient dialysis?: No       Financial Information       Need for financial assistance?: No       Social Determinants of Health  Social Determinants of Health were addressed in provider documentation.  Please refer to patient history.  Social Drivers of Health     Food Insecurity: No Food Insecurity (03/25/2023)    Hunger Vital Sign     Worried About Running Out of Food in the Last Year: Never true     Ran Out of Food in the Last Year: Never true   Tobacco Use: Low Risk  (03/26/2023)    Patient History     Smoking Tobacco Use: Never     Smokeless Tobacco Use: Never     Passive Exposure: Never   Transportation Needs: No Transportation Needs (03/25/2023)    PRAPARE - Transportation     Lack of Transportation (Medical): No     Lack of Transportation (Non-Medical): No   Alcohol Use: Not At Risk (11/05/2021)    Alcohol Use     How often do you have a drink containing alcohol?: Monthly or less     How many drinks containing alcohol do you have on a typical day when you are drinking?: 1 -  2     How often do you have 5 or more drinks on one occasion?: Never   Housing: Low Risk  (03/25/2023)    Housing     Within the past 12 months, have you ever stayed: outside, in a car, in a tent, in an overnight shelter, or temporarily in someone else's home (i.e. couch-surfing)?: No     Are you worried about losing your housing?: No   Physical Activity: Inactive (11/05/2021)    Exercise Vital Sign     Days of Exercise per Week: 0 days     Minutes of Exercise per Session: 0 min   Utilities: Not on file   Stress: Stress Concern Present (11/05/2021)    [employer] of Occupational Health - Occupational Stress Questionnaire     Feeling of Stress : To some extent   Interpersonal Safety: Not At Risk (11/05/2021)    Interpersonal Safety     Unsafe Where You Currently Live: No     Physically Hurt by Anyone: No     Abused by Anyone: No   Substance Use: Not on file (11/19/2022)   Intimate Partner Violence: Not At Risk (11/05/2021)    Humiliation, Afraid, Rape, and Kick questionnaire     Fear of Current or Ex-Partner: No     Emotionally Abused: No     Physically Abused: No     Sexually Abused: No   Social Connections: Moderately Isolated (11/05/2021)    Social Connection and Isolation Panel [NHANES]     Frequency of Communication with Friends and Family: More than three times a week     Frequency of Social Gatherings with Friends and Family: Once a week     Attends Religious Services: More than 4 times per year     Active Member of [employer] or Organizations: No     Attends Banker Meetings: Never     Marital Status: Never married   Programmer, applications: Low Risk  (03/25/2023)    Overall Financial Resource Strain (CARDIA)     Difficulty of Paying Living Expenses: Not hard at all   Depression: Not at risk (02/25/2023)    Received from [facility]    PHQ-2     PHQ-2 Total Score: 2   Internet Connectivity: No Internet connectivity concern identified (11/05/2021)    Internet Connectivity     Do you have access to internet services: Yes     How do you connect to the internet: Personal Device at home     Is your internet connection strong enough for you to watch video on your device without major problems?: Yes     Do you have enough data to get through the month?: Yes     Does at least one of the devices have a camera that you can use for video chat?: Yes   Health Literacy: Low Risk  (11/05/2021)    Health Literacy     : Never       Complex Discharge Information                                                                    Interventions:       Discharge Needs Assessment  Concerns to be Addressed:  discharge planning    Clinical Risk Factors: Multiple Diagnoses (Chronic), Lives Alone or Absence of Caregiver to Assist with Discharge and Home Care    Barriers to taking medications: No                   Anticipated Changes Related to Illness: other (see comments) (unable to determine at this time)    Equipment Needed After Discharge: other (see comments) (unable to determine at this time)    Discharge Facility/Level of Care Needs: other (see comments) (unable to determine at this time)    Readmission  Risk of Unplanned Readmission Score: UNPLANNED READMISSION SCORE: 12.88%  Predictive Model Details 13% (Medium)  Factor Value    Calculated 03/27/2023 08:06 22% Number of ED visits in last six months 3    [place] Risk of Unplanned Readmission Model 14% Diagnosis of cancer present     12% ECG/EKG order present in last 6 months     12% Charlson Comorbidity Index 8     10% Number of active inpatient medication orders 11     9% Imaging order present in last 6 months     6% Diagnosis of deficiency anemia present     6% Active corticosteroid inpatient medication order present     5% Age 48     3% Future appointment scheduled     1% Active ulcer inpatient medication order present     0% Current length of stay 0.12 days      Readmitted Within the Last 30 Days? (No if blank)   Patient at risk for readmission?: N/A    Discharge Plan       Expected Discharge Date:     Expected Transfer from Critical Care:      Quality data for continuing care services shared with patient and/or representative?: N/A  Patient and/or family were provided with choice of facilities / services that are available and appropriate to meet post hospital care needs?: N/A       Initial Assessment complete?: Yes    Type of Residence: Mailing Address:  [address] [place] [address]  Contacts:    Patient Phone Number: [phone]  Extended Emergency Contact Information  Primary Emergency Contact: Huntley,Tammy  Mobile Phone: [phone]  Relation: Sister  Preferred language: ENGLISH  Interpreter needed? No  Secondary Emergency Contact: Milbourn,Jay   [place]  Home Phone: [phone]  Relation: Brother        Medical Provider(s): Mangel, Benison Pap, DO  Reason for Admission: Admitting Diagnosis:  Brain Mass  Past Medical History:   has a past medical history of Ankylosing spondylitis (CMS-HCC), Atherosclerotic cardiovascular disease (11/08/2021), Cancer (CMS-HCC) (2012), Cognitive impairment, Crohn's disease (CMS-HCC), Depressive disorder (04/13/2015), Disease of thyroid gland, Diverticulitis of colon (2011), Gout, Hearing impairment, HL (hearing loss), HLD (hyperlipidemia) (11/08/2021), Hypothyroidism, Iron deficiency anemia (11/06/2021), Metastatic cancer (CMS-HCC), Skin cancer, and Thyroid nodule (2013).  Past Surgical History:   has a past surgical history that includes pr removal nodes, neck,cerv cmplt (Left, 12/07/2012); pr removal nodes, neck,cerv mod rad (Right, 03/29/2013); pr lap, ventral hernia repair,reducible (N/A, 04/07/2014); pr repair recurr incis hernia,reduc (N/A, 01/03/2015); pr implant mesh hernia repair/debridement closure (N/A, 01/03/2015); pr repair recurr incis hernia,strang (N/A, 06/21/2016); pr colonoscopy w/biopsy single/multiple (N/A, 01/09/2018); pr sigmoidoscopy,fine needl bx,us guided (N/A, 01/09/2018); pr colonoscopy w/biopsy single/multiple (N/A, 08/24/2019); pr colsc flx with directed submucosal njx any sbst (N/A, 08/24/2019); pr exploratory of abdomen (N/A, 01/24/2020); pr freeing bowel adhesion,enterolysis (  N/A, 01/24/2020); pr repair recurr incis hernia,strang (N/A, 01/24/2020); pr negative pressure wound therapy dme >50 sq cm (N/A, 01/24/2020); pr colonoscopy flx dx w/collj spec when pfrmd (N/A, 08/29/2021); pr colonoscopy w/biopsy single/multiple (N/A, 08/29/2021); Colonoscopy; Cosmetic surgery; Colon surgery; and Thyroid surgery.   Previous admit date: 01/19/2020    Primary Insurance- Payor: BCBS / Plan: BCBS BLUE OPTIONS/PPO/ADV ([place] ONLY) / Product Type: *No Product type* /   Secondary Insurance - None  Prescription Coverage -   [phone] [place] [place] [facility] [address]  [facility]  [facility]    Transportation home: Private vehicle

## 2023-03-27 NOTE — Unmapped (Signed)
 Today's neurosurgical consult note will serve as the H&P.

## 2023-03-28 MED ORDER — DEXAMETHASONE 6 MG TABLET
ORAL_TABLET | Freq: Two times a day (BID) | ORAL | 0 refills | 30.00000 days | Status: CP
Start: 2023-03-28 — End: ?
  Filled 2023-03-28: qty 60, 30d supply, fill #0

## 2023-03-28 MED ADMIN — multivitamins, therapeutic with minerals tablet 1 tablet: 1 | ORAL | @ 14:00:00 | Stop: 2023-03-28

## 2023-03-28 MED ADMIN — acetaminophen (TYLENOL) tablet 1,000 mg: 1000 mg | ORAL | @ 04:00:00 | Stop: 2023-03-28

## 2023-03-28 MED ADMIN — dexAMETHasone (DECADRON) tablet 6 mg: 6 mg | ORAL

## 2023-03-28 MED ADMIN — senna (SENOKOT) tablet 1 tablet: 1 | ORAL | @ 14:00:00 | Stop: 2023-03-28

## 2023-03-28 MED ADMIN — oxyCODONE (ROXICODONE) immediate release tablet 5 mg: 5 mg | ORAL | @ 14:00:00 | Stop: 2023-03-28

## 2023-03-28 MED ADMIN — zinc sulfate (ZINCATE) capsule 220 mg: 220 mg | ORAL | @ 14:00:00 | Stop: 2023-03-28

## 2023-03-28 MED ADMIN — ascorbic acid (vitamin C) (VITAMIN C) tablet 250 mg: 250 mg | ORAL | @ 14:00:00 | Stop: 2023-03-28

## 2023-03-28 MED ADMIN — oxyCODONE (ROXICODONE) immediate release tablet 5 mg: 5 mg | ORAL | @ 07:00:00 | Stop: 2023-03-28

## 2023-03-28 MED ADMIN — levothyroxine (SYNTHROID) tablet 375 mcg: 375 ug | ORAL | @ 10:00:00 | Stop: 2023-03-28

## 2023-03-28 MED ADMIN — dexAMETHasone (DECADRON) tablet 6 mg: 6 mg | ORAL | @ 14:00:00 | Stop: 2023-03-28

## 2023-03-28 NOTE — Unmapped (Signed)
 Pt alert/oriented. Up ad lib. Headache and back pain controlled with PRN medications. Reg diet, good intake. Continet, voiding. VSS, skin intact. No further concerns at this time.     Problem: Adult Inpatient Plan of Care  Goal: Plan of Care Review  Outcome: Progressing  Goal: Patient-Specific Goal (Individualized)  Outcome: Progressing  Goal: Absence of Hospital-Acquired Illness or Injury  Outcome: Progressing  Intervention: Identify and Manage Fall Risk  Recent Flowsheet Documentation  Taken 03/28/2023 1000 by Rikki Spearing, RN  Safety Interventions:   bed alarm   fall reduction program maintained   low bed  Taken 03/28/2023 0800 by Tziporah Knoke, RN  Safety Interventions:   bed alarm   fall reduction program maintained   low bed  Intervention: Prevent and Manage VTE (Venous Thromboembolism) Risk  Recent Flowsheet Documentation  Taken 03/28/2023 1000 by Shadell Brenn, RN  Anti-Embolism Device Type: SCD, Knee  Anti-Embolism Device Status: Refused  Anti-Embolism Device Location: BLE  Taken 03/28/2023 0800 by Treyce Spillers, RN  Anti-Embolism Device Type: SCD, Knee  Anti-Embolism Device Status: Refused  Anti-Embolism Device Location: BLE  Goal: Optimal Comfort and Wellbeing  Outcome: Progressing  Goal: Readiness for Transition of Care  Outcome: Progressing  Goal: Rounds/Family Conference  Outcome: Progressing     Problem: Wound  Goal: Optimal Coping  Outcome: Progressing  Goal: Optimal Functional Ability  Outcome: Progressing  Goal: Absence of Infection Signs and Symptoms  Outcome: Progressing  Goal: Improved Oral Intake  Outcome: Progressing  Goal: Optimal Pain Control and Function  Outcome: Progressing  Goal: Skin Health and Integrity  Outcome: Progressing  Goal: Optimal Wound Healing  Outcome: Progressing

## 2023-03-28 NOTE — Unmapped (Cosign Needed)
 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  [facility] 3/13 demonstrated stable mass with stable cerebral edema when compared to [facility] on 3/10.   They were seen and consented for surgical intervention after discussing the risks, benefits, and alternatives in full detail. Scheduled to undergo surgery on 3/21. He was started on dexamethasone 6mg  BID and will have 24/7 supervision with family at home. He was discharged home in stable condition.       The patient's hospitalization has been complicated by the following clinically significant conditions requiring additional evaluation and treatment or having a significant effect of this patient's care: - Clinically significant cerebral edema requiring further investigation, treatment, or monitoring  - Brain Compression requiring further investigation, treatment, or monitoring    Follow-up:  3/18 pre-op appointment with Dr. Janeece Agee  2.  04/04/2022 surgery with Dr. Janeece Agee     Procedures:   None     Pathology:   None      Discharge Day Services:   The patient was seen and evaluated by the Neurosurgery team on the day of discharge and deemed stable for discharge, including stable labs, vital signs, radiographic studies, and neurologic exam.  Discharge instructions were given and explained.  Questions were answered.    Physical Exam:  General: No acute distress  Cardiovascular: Hemodynamically stable  Pulmonary: Breathing is unlabored  Neurologic:   EOSp  AOX3  PERRL  EOMI  FS  TML  5/5 x4  SILT  Slight LT pronator drift    _____________________________________________________________________________    Temp:  [36.5 ??C (97.7 ??F)-36.7 ??C (98.1 ??F)] 36.7 ??C (98.1 ??F)  Heart Rate:  [76-89] 88  SpO2 Pulse:  [89] 89  Resp:  [16-18] 18  BP: (129-150)/(73-95) 133/78  MAP (mmHg):  [90-110] 94  SpO2:  [94 %-99 %] 98 %  BMI (Calculated):  [39.7] 39.7      Condition at Discharge:   Stable  _____________________________________________________________________________  Discharge Medications:     Your Medication List        START taking these medications      zinc sulfate 220 mg (50 mg elemental zinc) capsule  Commonly known as: ZINCATE  Take 1 capsule (220 mg total) by mouth daily.            CHANGE how you take these medications      dexAMETHasone 6 MG tablet  Commonly known as: DECADRON  Take 1 tablet (6 mg total) by mouth every twelve (12) hours.  What changed:   medication strength  how much to take            CONTINUE taking these medications      acetaminophen 500 MG tablet  Commonly known as: TYLENOL  Take 2 tablets (1,000 mg total) by mouth two (2) times a day.  ALLEGRA ALLERGY 180 mg tablet  Generic drug: fexofenadine  Take 1 tablet (180 mg total) by mouth daily as needed (allergies).     blood sugar diagnostic Strp  Dispense 100 blood glucose test strips, ok to sub any brand preferred by insurance/patient, use 3x/day; dispense whatever brand matches with meter.     blood-glucose meter kit  Use as instructed; dispense 1 meter, whatever is preferred by insurance     BRAFTOVI 75 mg capsule  Generic drug: encorafenib  Take 6 capsules (450 mg total) by mouth daily.     calcitriol 0.25 MCG capsule  Commonly known as: ROCALTROL  Take 1 capsule (0.25 mcg total) by mouth daily.     DULoxetine 30 MG capsule  Commonly known as: CYMBALTA  Take 1 capsule (30 mg total) by mouth two (2) times a day.     ezetimibe 10 mg tablet  Commonly known as: ZETIA  Take 1 tablet (10 mg total) by mouth daily.     fluticasone propionate 50 mcg/actuation nasal spray  Commonly known as: FLONASE  1 spray into each nostril daily.     folic acid 1 MG tablet  Commonly known as: FOLVITE  Take 1 tablet (1,000 mcg total) by mouth daily.     lancets Misc  Dispense 100 lancets, ok to sub any brand preferred by insurance/patient, use 3x/day     [name] 15 mg tablet  Generic drug: binimetinib  Take 3 tablets (45 mg total) by mouth two (2) times a day.     ondansetron 8 MG tablet  Commonly known as: ZOFRAN  Take 1 tablet (8 mg total) by mouth every eight (8) hours as needed for nausea.     oxyCODONE 5 MG immediate release tablet  Commonly known as: ROXICODONE  Take 1 tablet (5 mg total) by mouth every four (4) hours as needed for up to 5 days.     pantoprazole 40 MG tablet  Commonly known as: Protonix  Take 1 tablet (40 mg total) by mouth every morning.     sulfaSALAzine 500 mg tablet  Commonly known as: AZULFIDINE  Take 2 tablets (1,000 mg total) by mouth two (2) times a day.     SYNTHROID 125 mcg tablet  Generic drug: levothyroxine  Take 3 tablets (375 mcg total) by mouth daily.            _____________________________________________________________________________  Pending Test Results (if blank, then none):      Most Recent Labs:  Microbiology Results (last day)       ** No results found for the last 24 hours. **            Lab Results   Component Value Date    WBC 11.4 (H) 03/26/2023    HGB 15.1 03/26/2023    HCT 44.5 03/26/2023    PLT 225 03/26/2023       Lab Results   Component Value Date    NA 141 03/26/2023    K 3.5 03/26/2023    CL 99 03/26/2023    CO2 22.0 03/26/2023    BUN 21 03/26/2023    CREATININE 1.09 03/26/2023    CALCIUM 8.7 03/26/2023    MG 1.8 05/30/2022    PHOS 4.4 10/07/2022       _____________________________________________________________________________  Discharge Instructions: Other Instructions       Discharge instructions      -We will call in follow up in a few weeks.  If patient does not receive an appointment within  1 week of discharge, please contact the neurosurgery clinic at the number listed above to confirm/schedule your appointment.    Things to look out for:  -Pain- please call the Neurosurgery resident on-call for any worsening pain unrelieved with prescribed medications.  -Fever- If you have a fever over 101.5 F, call the Neurosurgery resident on-call. A low grade fever is normal for some people after surgery.  -Redness, drainage, odor, bleeding- If your incision is red, draining, has a bad odor, or has increasing bleeding, contact the Neurosurgery office during business hours or the Neurosurgery resident on-call after hours. There may be some normal swelling and pink-coloring around the wound. There may also be some fluid under the skin. It can take several weeks to months to be reabsorbed.  -Muscle strength- If an arm or leg becomes weak, call the Neurosurgery resident on     -Call immediately for the following:  -Bowel and Bladder- If you have new loss of bowel or bladder control, report straight to the emergency room.  -Mental status- If you or your family notice a change in your mental state from your normal, go straight to the emergency room. A change in mental status includes: increased confusion, trouble waking up, blurry/double vision, severe uncontrolled headaches, vomiting, loss of control of one side of your body, seizures, or other concerning issues.    Important Numbers:  -Neurosurgery resident on-call 24/7: Call [phone] for the [facility] hospital operator. Ask to speak with the Neurosurgery resident on-call.  -Neurosurgery Clinic: [phone] M-F 8am-5pm  -Spine Center: [phone] M-F 8am-5pm  -Neurosurgery Office: [phone] M-F 8am-5pm    OR Specific:  Wound care (Sutures/Staples):   Examine the surgical site at least twice daily. Please change the dressing when wet/dirty until the 3rd day after surgery, then keep the wound open to air. Do not use ointments, creams, gels, peroxide, or blow dryers on the wound. You may shower on the 4th day after surgery. Please shower daily starting on day 4 and use baby shampoo for head incisions to clean the wound. Do not scrub too vigorously. Do not submerge the wound under water (swimming or tub soaking) until 4-6 weeks after surgery. After showering, pat the wound dry and leave open to air. You have an appointment in approximately 2 weeks to have the stitches/staples removed. If you did not receive an appointment while in the hospital, the clinic will contact you will an appointment.    Wound care (Dermabond):   Examine the surgical site at least twice daily.  Do not use ointments, creams, gels, peroxide, or blow dryers on the wound. Please shower daily and use a mild soap and water for neck/back/groin incisions to clean the wound. Do not scrub too vigorously. Do not submerge the wound under water (swimming or tub soaking) until 2-4 weeks after surgery.  After showering, pat the wound dry and leave open to air.  If you have dermabond (surgical glue), do not pick at it as it will come off when ready.            Follow Up instructions and Outpatient Referrals     Discharge instructions          Appointments which have been scheduled for you      Mar 31, 2023 3:00 PM  (Arrive by 2:45 PM)  RETURN  ENDOCRINE with Tripuraneni Jackquline Denmark, MD  [facility] DIABETES AND ENDOCRINOLOGY [place]) [address]  [address] through  4  [place] [address]  [phone]        Apr 01, 2023 8:20 AM  (Arrive by 8:05 AM)  RETURN NEUROSURG with Arman Filter, MD  [facility] NEUROSURGERY [address] [place] [facility] REGION) [address]  [address]rd Floor  [place] [address]  [phone]        Apr 07, 2023 8:00 AM  (Arrive by 7:45 AM)  MRI Brain With and Without Contrast with HBR MRI MBL  IMG MRI [facility]) [address]  [place] [address]  [phone]   On appt date:  Bring recent lab work  Bring documentation of any metal object implants  Take meds as usual  Check w/physician if diabetic  You will be asked to change into a gown for your safety    On appt date do not:  No restrictions on food/drink  Wear metallic items including jewelry (we are not responsible for lost items)    Let us know if pt:  Claustrophobic  Metal object implant  Pregnant  Prescribed a sedative  On dialysis  Allergic to MRI dye/contrast  Kidney Failure    (Title:MRIWCNTRST)         Apr 07, 2023 9:30 AM  (Arrive by 9:00 AM)  FOLLOW UP 15 with Ruben Reason, MD  [facility] RADIATION ONCOLOGY [place] [facility] REGION) [address]  [place] [address]  [phone]        Apr 30, 2023 10:30 AM  (Arrive by 10:00 AM)  LAB ONLY [place] with ADULT ONC LAB  [facility] ADULT ONCOLOGY LAB DRAW STATION  [facility] REGION) [address]  [place] [address]  [phone]        Apr 30, 2023 11:30 AM  (Arrive by 11:00 AM)  RETURN LUNG ACTIVE [place] with [name], MD  [place] ONCOLOGY MULTIDISCIPLINARY 2ND FLR CANCER HOSP [facility] REGION) [address]  [place] [address]  [phone]        Apr 30, 2023 12:20 PM  (Arrive by 12:05 PM)  CT CHEST W CONTRAST with IC CT RM 1  RAD [facility] ROAD [facility]) [address] [facility] [address]  1st Floor  [place] [address]  [phone]   On appt date:  Drink lots of water 24 hrs  Bring recent lab work  Take meds as usual  [employer] fast streamer of current meds  Bring snack if diabetic    Let us know if pt:  Allergic to contrast dyes  Diabetic  Pregnant or nursing  Claustrophobic    (Title:CTWCNTRST)         Apr 30, 2023 12:45 PM  (Arrive by 12:30 PM)  MRI Brain With and Without Contrast with IC MRI MBL 1  IMG MRI IMAGING CENTER [facility] - [facility] Spine [facility]) [address]  1st Floor  [place] [address]  [phone]   On appt date:  Bring recent lab work  Bring documentation of any metal object implants  Take meds as usual  Check w/physician if diabetic  You will be asked to change into a gown for your safety    On appt date do not:  No restrictions on food/drink  Wear metallic items including jewelry (we are not responsible for lost items)    Let us know if pt:  Claustrophobic  Metal object implant  Pregnant  Prescribed a sedative  On dialysis  Allergic to MRI dye/contrast  Kidney Failure    (Title:MRIWCNTRST)         Jun 03, 2023 10:20 AM  (Arrive by 10:05 AM)  Physical with Benison Pap Mangel, DO  [facility] PRIMARY CARE [address] AT [facility] REGION) [address] [place] [address]  [phone]   Arrive 15 minutes prior to appointment.         Jul 24, 2023 1:30 PM  (Arrive by 1:15 PM)  RETURN ENT with Adam [place] Kimple, MD  [facility] [address]OLARYNGOLOGY [address] [place] [facility] REGION) [address]  [address]rd Floor  [place] [address]  [phone]                     Time-based billing disclaimer:          I spent 65 minutes in activities related to discharge for this patient

## 2023-03-28 NOTE — Unmapped (Signed)
 Assessment & Plan  T1N1b papillary thyroid cancer (classic/follicular variant), multifocal  - Tg on suppression has been stable/low, with fluctuations commensurate with fluctuations in TSH. Tg Ab has been negative. Last check of both 09/2022. Repeat yearly.  -Last [place] 04/03/22 stable, repeat US ~ 03/2024 if Tg/Ab stable and clinically appropriate    Hypothyroidism  Well-managed on levothyroxine 375 mcg daily by last labs. Monitor both TSH and T4 due to h/o brain XRT and potential pituitary involvement with brain metastasis from melanoma.  - Continue levothyroxine 375 mcg daily.  - Monitor TSH and T4, placed today, will do in next 1-2 months    Hypocalcemia, severe in the past, due to post surgical hypoparathyroidism  Managed with calcitriol 0.25 mcg daily and dietary/supplemental calcium as needed.   - Continue calcitriol 0.25 mcg daily.  - Ensure adequate calcium intake through diet and supplements. Details reviewed, aim for 1,200-1,500 mg/day total.  - Recent labs stable  - Reviewed goals of treatment and symptoms or signs with which to take extra calcium and/or notify us.     Stage IV melanoma with brain metastases  Recent hospitalization for management. Started on dexamethasone 6 mg BID. Surgery pending 3/21.  - Monitor blood glucose levels regularly as per prescribing team, reviewed BG values and s/s with which to notify PCP or prescribing team     Return in about 6 months (around 10/01/2023).     No orders of the defined types were placed in this encounter.            Reason For Visit:   Chief Complaint   Patient presents with    Follow-up thyroid cancer and hypoparathyroidism      Subjective:     History of Present Illness:    10/07/22 - visit, TFTs at goal (noting both TSH and FT4 on lower end on longtime LT4 375 mcg daily, follow carefully given history of XRT to brain x 2, last in 04/2022), Tg/Ab 0.2/<1.8, calcium 9.0, RTC 6 mo  3/12-3/14 - admitted to neurosurgery service, has appt w me 3/17    History of Present Illness  Kenneth Fitzgerald is a 48 year old male with thyroid cancer, hypothyroidism, hypoparathyroidism, and brain metastases from melanoma who presents for follow-up of thyroid and calcium. *Changed from in person to video visit today re: recent hospital stay and worsening of brain mets from melanoma.    He was hospitalized from March 12 to March 28, 2023, due to complications related to melanoma with metastases to the brain. During this hospitalization, he was treated with dexamethasone 6 mg for symptoms affecting brain function, including memory issues. He has noticed improvement in these symptoms since discharge. Surgery/craniotomy is scheduled for 3/21.    He has a history of thyroid cancer and hypothyroidism, for which he is on levothyroxine 375 mcg daily. He continued this medication during his hospital stay. His thyroid function tests were not checked during the recent hospitalization, but he is due for follow-up labs.    He also has a history of parathyroid issues and is taking calcitriol 0.25 mcg daily. His calcium levels were stable during his hospital stay, and he reports dietary calcium intake primarily from milk.    He has a history of prediabetes, particularly when on steroids, and was advised to monitor his blood sugar levels. He has the necessary supplies to check his blood sugar at home.    He experienced nausea and vomiting in the past week, but these symptoms have resolved at  the time of the visit. No current nausea or vomiting.    Weight trend:  Wt Readings from Last 6 Encounters:   03/27/23 (!) 140.3 kg (309 lb 4.9 oz)   03/24/23 (!) 140.3 kg (309 lb 4.9 oz)   02/18/23 (!) 144 kg (317 lb 6.4 oz)   02/12/23 (!) 145.2 kg (320 lb 1.7 oz)   02/04/23 (!) 145.6 kg (321 lb)   01/30/23 (!) 146.1 kg (322 lb)       PMH:  1. Stage T1N1b papillary thyroid cancer. Initially noted on PET when following melanoma. Classic/follicular variant), multifocal, s/p 3 surgeries and 2 doses of I-131. Last treatment was in 2015. Indeterminate response to treatment based on persistent detectable Tg (< 1 on suppression, rose to 2.4 with stimulation in Jan 2018).      1a. Thyroidectomy 06/25/2011 with 1 foci each lobe and 2/2 central LN (left Level 6)     1b. Thyrogen-based RAI 125 mCi 03/2012 with pre/post scans showing fairly large focus of bed activity and 4.2% uptake; pretx stimulated Tg of 73     1c. Recurrence on [place] 09/2012 in left thyroid bed and confirmed by FNA 10/2012; left LND in 11/2012 levels 3-6 with 12/16 positive (largest 1.8 cm, and some with extranodal extension)     1d. Right CND 03/2013 levels 2-4 with 3/20 positive (largest 3.5 mm), found incidentally as the right ND was performed for melanoma recurrence which was 1 cm node in 1 of 20 nodes     1e. Thyrogen-based treatment with 150 mCi of I-131 in 05/2013 [LID as well before the treatment]      47f. Thyrogen-stimulated Tg was 2.4 in 01/2016. WBS in 01/2016 was negative.  2. Postsurgical hypothyroidism  3. Postsurgical hypoparathyroidism  Hypocalcemia was severe at presentation, initially treated with supplemental high-dose calcium alone  Started calcitriol 2017 (low calcium in 2019 and in 2020 when off calcitriol)  Calcium at or close to goal when taking calcitriol regularly and getting enough dietary or supplemental calcium. Can drop very low otherwise.  3. Metastatic melanoma - scalp lesion, surgery including RND, and systemic therapy ending 01/2012; recurrence in a 1 cm right LN removed 03/2013 on repeat RND; XRT completed 06/2013; whole brain XRT for brain metastases 12/2019-01/2020; XRT to brain again 04/2022  4. Diverticulitis  5. H/o anklyosing spondylitis 2009  6.  Small bowel obstruction, hospitalization 11/2018, due to adhesions from past abdominal surgeries including hernia repairs  7. Hyperglycemia, new in 03/2018, in the setting of 4 weeks of prednisone to treat a reaction to immunotherapy.  A1c 6.0% in 06/2018. Took MTF short time. A1c improved in 08/2019. Has been off MTF since 12/2019.  -A1c normal 04/2020. Glucose values on recent panels have been normal.   8. Crohns, diagnosed 09/2019 by Dr. Stevphen Rochester in GI, but likely present since 2019 per note, defer immunosuppressive therapy re: active melanoma, start w sulfasalazine     PSH reviewed in Epic      Current Outpatient Medications:     acetaminophen (TYLENOL) 500 MG tablet, Take 2 tablets (1,000 mg total) by mouth two (2) times a day., Disp: , Rfl:     binimetinib ([name]) 15 mg tablet, Take 3 tablets (45 mg total) by mouth two (2) times a day., Disp: 180 tablet, Rfl: 11    blood sugar diagnostic Strp, Dispense 100 blood glucose test strips, ok to sub any brand preferred by insurance/patient, use 3x/day; dispense whatever brand matches with meter., Disp: 100 strip, Rfl: 12  blood-glucose meter kit, Use as instructed; dispense 1 meter, whatever is preferred by insurance, Disp: 1 each, Rfl: 1    calcitriol (ROCALTROL) 0.25 MCG capsule, Take 1 capsule (0.25 mcg total) by mouth daily., Disp: 90 capsule, Rfl: 3    dexAMETHasone (DECADRON) 6 MG tablet, Take 1 tablet (6 mg total) by mouth every twelve (12) hours., Disp: 60 tablet, Rfl: 0    DULoxetine (CYMBALTA) 30 MG capsule, Take 1 capsule (30 mg total) by mouth two (2) times a day., Disp: , Rfl:     encorafenib (BRAFTOVI) 75 mg capsule, Take 6 capsules (450 mg total) by mouth daily., Disp: 180 capsule, Rfl: 11    ezetimibe (ZETIA) 10 mg tablet, Take 1 tablet (10 mg total) by mouth daily., Disp: 90 tablet, Rfl: 3    fexofenadine (ALLEGRA ALLERGY) 180 MG tablet, Take 1 tablet (180 mg total) by mouth daily as needed (allergies)., Disp: , Rfl:     fluticasone propionate (FLONASE) 50 mcg/actuation nasal spray, 1 spray into each nostril daily., Disp: , Rfl:     folic acid (FOLVITE) 1 MG tablet, Take 1 tablet (1,000 mcg total) by mouth daily., Disp: 90 tablet, Rfl: 3    lancets Misc, Dispense 100 lancets, ok to sub any brand preferred by insurance/patient, use 3x/day, Disp: 100 each, Rfl: 12    levothyroxine (SYNTHROID) 125 MCG tablet, Take 3 tablets (375 mcg total) by mouth daily., Disp: 270 tablet, Rfl: 3    ondansetron (ZOFRAN) 8 MG tablet, Take 1 tablet (8 mg total) by mouth every eight (8) hours as needed for nausea., Disp: 30 tablet, Rfl: 1    pantoprazole (PROTONIX) 40 MG tablet, Take 1 tablet (40 mg total) by mouth every morning., Disp: 30 tablet, Rfl: 0    sulfaSALAzine (AZULFIDINE) 500 mg tablet, Take 2 tablets (1,000 mg total) by mouth two (2) times a day., Disp: 360 tablet, Rfl: 3    zinc sulfate (ZINCATE) 220 mg (50 mg elemental zinc) capsule, Take 1 capsule (220 mg total) by mouth daily., Disp: , Rfl:       Allergies   Allergen Reactions    Compazine [Prochlorperazine] Itching    Coconut Nausea And Vomiting       Social Hx:  -Working at [employer] since April 23, 2022  -Nonsmoker    Family History   Problem Relation Age of Onset    Hyperthyroidism Mother     Osteoporosis Mother     Arrhythmia Mother     Squamous cell carcinoma Mother         basal cell vs squamous cell skin cancer    Arthritis Mother     Cancer Mother     Coronary artery disease Father         s/p CABG    Diabetes Father     Hypertension Father     Prostate cancer Father     Angina Father     Cancer Father     Heart disease Father     Colon cancer Paternal Grandmother     Diabetes Paternal Grandmother     Diabetes Maternal Aunt     Diabetes Paternal Uncle     Diabetes Paternal Aunt     Thyroid disease Neg Hx    Father died 2017-04-22 with diabetes and heart disease  Mother died in 22-Apr-2012    Objective:     Physical Exam:  There were no vitals taken for this visit.   BP Readings from Last 3 Encounters:   03/28/23  159/87   03/25/23 153/89   02/12/23 142/112     General appearance - pleasant, conversant, appear tired; voice and speech normal  Neck - supple on gross visualization, rest of neck exam limited by Video Visit  Neurological - no visible tremors    09/2022 in person exam:  General appearance - Pleasant, conversant, NAD. Generalized and abdominal obesity.   Eyes -  No stare or lid lag, no lid edema  Neck - Supple w/o palpable LAD, no masses appreciated. Well healed incision from thyroidectomy.and previous LND.   Lymphatics - no cervical or supraclavicular adenopathy appreciated   Resp - clear to auscultation bilaterally  CV - normal rate, regular rhythm  Neurological - no hand tremors, 2+ upper extremity DTRs  Extremities - peripheral pulses normal, no lower extremity edema  Skin - warm, dry, no acanthosis     Data Review:    TSH   Date Value   10/07/2022 0.921 uIU/mL   07/17/2022 5.233 uIU/mL (H)   04/03/2022 4.582 uIU/mL   10/04/2013 0.11 u[iU]/mL (L)   06/28/2013 0.28 u[iU]/mL (L)   04/27/2013 0.09 u[iU]/mL (L)     Free T4 (ng/dL)   Date Value   46/96/2952 0.89   07/17/2022 0.94   04/03/2022 0.81 (L)   10/04/2013 1.55 (H)   06/28/2013 1.40   04/27/2013 1.69 (H)     THYROGLOBULIN AB (IU/mL)   Date Value   10/07/2022 <1.8   10/10/2021 <1.8   08/30/2019 <1.8     Thyroglobulin, Tumor Marker, IA (ng/mL)   Date Value   10/07/2022 0.2 (H)   10/10/2021 0.6 (H)   08/30/2019 0.3 (H)      Lab Results   Component Value Date    CALCIUM 8.7 03/26/2023    CALCIUM 8.7 03/25/2023    CALCIUM 9.3 03/24/2023    PHOS 4.4 10/07/2022    PHOS 4.3 10/31/2021    PHOS 5.8 (H) 05/10/2020    CREATININE 1.09 03/26/2023    CREATININE 0.99 03/25/2023    CREATININE 0.86 03/24/2023    VITDTOTAL 23.8 08/30/2022    VITDTOTAL 17.4 (L) 01/16/2022    VITDTOTAL 31.0 02/27/2017    PTH 9.9 (L) 12/04/2015    PTH 12.4 12/12/2014    PTH 12 04/27/2013     Lab Results   Component Value Date    A1C 5.4 05/10/2020     Calcium values while hospitalized in 06/2016 were < 7, had low ionized calcium as well.     01/2016 - Thyrogen stimulated testing  - Tg rose to 2.4    PATH from 03/2013 right neck dissection:  Amended: 03/31/2013 by Kirkland Hun, MD  Reason: Additional Information  Comment: The report is amended to add the results of a BRAF V600E immunostain.  The final diagnosis is unchanged.  Previous Signout Date: 03/30/2013  Diagnosis:  Lymph nodes, right cervical, levels 2-4, removal and partial parotidectomy  -Metastatic melanoma involving 1 out of 20 lymph nodes, with the largest diameter measuring 10 mm (1.0 cm) and no evidence of extracapsular extension (1/20)  -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, with the largest diameter measuring 3.5 mm and no evidence of extracapsular extension (3/20)   -BRAF V600E immunohistochemical staining is positive (3+ staining, 100% of cells) --> this was on LN with melanoma  -Benign parotid gland is also present  *Molecular testing:  RESULTS:  Gene Variants of Known Clinical Utility:   BRAF: No mutation detected (see note) --> A BRAF  V600E immunohistochemical stain was performed on the tissue section of this case and was positive (see case [id number]). Though within validated parameters, the tumor input for sequencing in this case was low. This could result in the V600E mutant allele frequency falling below the limit of detection for the assay. [Again on LN with melanoma]  KIT: No mutation detected     Radiology:    04/03/22 US Thyroid    COMPARISON: Ultrasound 01/03/2021 and priors    FINDINGS: Patient is status post total thyroidectomy. No residual thyroid tissue is visualized in the thyroidectomy bed.    No pathologically enlarged lymph nodes were identified throughout the neck.    Impression  1. Status post total thyroidectomy without sonographic evidence of residual thyroid tissue or recurrent disease with the thyroid bed.  2. No pathologically enlarged lymph nodes identified within the neck.    01/2016 Whole body scan:  FINDINGS:  There are no abnormal foci of increased radiotracer uptake identified.  There is physiologic uptake seen in the nasopharynx, the bowel, stomach, and bladder.  Impression  No evidence of I-131 avid metastasis.    05/2013 had stimulated PET and WBS, as well as 1 week post treatment WBS

## 2023-03-28 NOTE — Unmapped (Signed)
 PT Aox4. VSS. PRNs for pain given throughout shift. Reported incontinence of bowel and bladder, up ad lib this shift with bathroom privileges without complaint. No new skin breakdown noted. Low sugar diet, good intake. No further concerns at this time.  Problem: Adult Inpatient Plan of Care  Goal: Absence of Hospital-Acquired Illness or Injury  Intervention: Identify and Manage Fall Risk  Recent Flowsheet Documentation  Taken 03/27/2023 2000 by Katherina Mires, RN  Safety Interventions:   fall reduction program maintained   low bed   lighting adjusted for tasks/safety   nonskid shoes/slippers when out of bed

## 2023-03-31 ENCOUNTER — Encounter: Admit: 2023-03-31 | Discharge: 2023-04-01 | Payer: BLUE CROSS/BLUE SHIELD

## 2023-03-31 DIAGNOSIS — E278 Other specified disorders of adrenal gland: Principal | ICD-10-CM

## 2023-03-31 DIAGNOSIS — E890 Postprocedural hypothyroidism: Principal | ICD-10-CM

## 2023-03-31 DIAGNOSIS — R739 Hyperglycemia, unspecified: Principal | ICD-10-CM

## 2023-03-31 DIAGNOSIS — C73 Malignant neoplasm of thyroid gland: Principal | ICD-10-CM

## 2023-04-01 ENCOUNTER — Encounter
Admit: 2023-04-01 | Discharge: 2023-04-01 | Payer: BLUE CROSS/BLUE SHIELD | Attending: Radiation Oncology | Primary: Radiation Oncology

## 2023-04-01 ENCOUNTER — Encounter: Admit: 2023-04-01 | Discharge: 2023-04-01 | Payer: BLUE CROSS/BLUE SHIELD

## 2023-04-01 DIAGNOSIS — C7931 Secondary malignant neoplasm of brain: Principal | ICD-10-CM

## 2023-04-01 NOTE — Unmapped (Signed)
 Called the pt to discuss enrollment on the GTM-101 research study. I spoke with his sister as he was asleep and she asked if I could call back tomorrow evening when she is home from work. I thanked her for her time and said I'll call back tomorrow around 6:30pm.
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
## 2023-04-01 NOTE — Unmapped (Signed)
 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     PMH: Patient was noted to have a large parasagittal hemorrhage on outpatient MRI. He was asymptomatic at the time but headaches gradually worsened and he had an episode of vomiting. Unclear if related to headaches or GI. CT with resolution of most of the blood and improved mass effect. Today he is aa, fs, mae ag. Reviewed imaging. Given improvement would not recommend any surgical resection. Would consider additional radiation to that area versus observation. Advised of signs and symptoms that would warrant more urgent evaluation. Sister a critical care nurse also assisting him. He endorses some left radicular pain to the mid calf and has some trace DF, EHL weakness. Reports history of multiple herniated discs. Will order updated lumbar MRI and refer to my spine colleagues. He is on steroids currently tapering to off.     IH: Patient rebled and was seen in ED. Opted for discharge home after extensive risks, benefits, alternative discussion of options and recommendation for surgery. He returned shortly thereafter due to issues with driving and self care. Re-visited surgery plan and now amenable. Discussed given prior scalp incision recommend resection with gamma tile placement and plastic surgery assistance. Patient still symptomatic and under care of his sister.     Plan:  OR Friday for resection with gamma tiles and plastic surgery assistance      The patient reports they are physically located in [place] and is currently: at home. I conducted a audio/video visit. I spent  89m 39s on the video call with the patient. I spent an additional 20 minutes on pre- and post-visit activities on the date of service .

## 2023-04-02 NOTE — Unmapped (Signed)
 INFORMED CONSENT DOCUMENTATION    Visit Date: 04/02/23    Study IRB #: [id number]     [facility] #: N/A    Patient Name: Kenneth Fitzgerald    MRN: [id number]    DOB: [birth year]    I met with the patient,Kenneth Fitzgerald, to discuss consent for the study A Multicenter Observational Study of GammaTile??? Surgically Targeted Radiation Therapy (STaRT) in Intracranial Brain Neoplasms. The consent form was reviewed via DocuSign, including discussion of risks & benefits, procedures, confidentiality, time commitments involved, study contact list, and the option to withdraw at any time. Alternatives to study participation were discussed.    The patient had ample time to consider participation in the study, in the absence of coercion or undue influence. Patient was offered an opportunity to ask questions and these questions were answered to their satisfaction. Patient verbalized understanding of information presented. The patient has signed and dated the following documents via DocuSign: HIPAA Authorization Form version dated 11/02/18, informed consent document version dated 11/20/2020, and consent for use of images dated 08/05/2020. The patient agreed for use of their images by the sponsor.    Signed and dated copies of all consent documents were given to the patient. Every effort to maintain confidentiality will be employed.    No procedures specifically related to the research were performed prior to informed consent being signed.      After the patient consented and eligibility criteria was signed, the patient completed the baseline LASA and FACT-Br questionnaires dated 09/18/18 and 01/28/19, respectively. Concomitant medications were collected and verified with the patient. The patient currently complains of headaches, nausea/vomiting, difficulty with balance, confusion, behavior changes, and hearing problems.     Plan: The patient is scheduled for GammaTile surgery on 04/04/23     Concomitant Medication Log:  Medication Start Date   Stop Date   Indication (related AE or MH)      Braftovi  10-23-2020   Malignant melanoma    [name]  10-23-2020  09-17-2023  Malignant melanoma   Dexamethasone 14-03-2023  Headache/hemorrhagic lesion                                          ATTENDING ATTESTATION:    I also discussed the research study with the patient, reviewing benefits and risks and that participation is entirely optional.  I agree with the documentation of informed consent in the research coordinator's note.      Mina Marble, MD, PhD  Associate Professor  Department of Radiation Oncology  [facility]  [id number]  [place], [place] [address]  O: [phone]  P: [phone]

## 2023-04-04 ENCOUNTER — Ambulatory Visit: Admit: 2023-04-04 | Payer: BLUE CROSS/BLUE SHIELD | Attending: Radiation Oncology | Primary: Radiation Oncology

## 2023-04-04 ENCOUNTER — Encounter: Admit: 2023-04-04 | Payer: BLUE CROSS/BLUE SHIELD

## 2023-04-04 ENCOUNTER — Ambulatory Visit: Admit: 2023-04-04 | Payer: BLUE CROSS/BLUE SHIELD

## 2023-04-04 ENCOUNTER — Inpatient Hospital Stay
Admit: 2023-04-04 | Discharge: 2023-04-18 | Disposition: A | Discharge status: Nursing Home (Includes ICF) | Payer: BLUE CROSS/BLUE SHIELD

## 2023-04-04 ENCOUNTER — Ambulatory Visit: Admit: 2023-04-04 | Discharge: 2023-04-18 | Disposition: A | Discharge status: Nursing Home (Includes ICF)

## 2023-04-04 DIAGNOSIS — C7931 Secondary malignant neoplasm of brain: Principal | ICD-10-CM

## 2023-04-04 LAB — CBC
HEMATOCRIT: 39.9 % (ref 39.0–48.0)
HEMOGLOBIN: 13.6 g/dL (ref 12.9–16.5)
MEAN CORPUSCULAR HEMOGLOBIN CONC: 34.1 g/dL (ref 32.0–36.0)
MEAN CORPUSCULAR HEMOGLOBIN: 29.3 pg (ref 25.9–32.4)
MEAN CORPUSCULAR VOLUME: 86.2 fL (ref 77.6–95.7)
MEAN PLATELET VOLUME: 7.8 fL (ref 6.8–10.7)
PLATELET COUNT: 272 10*9/L (ref 150–450)
RED BLOOD CELL COUNT: 4.63 10*12/L (ref 4.26–5.60)
RED CELL DISTRIBUTION WIDTH: 17.2 % — ABNORMAL HIGH (ref 12.2–15.2)
WBC ADJUSTED: 15.1 10*9/L — ABNORMAL HIGH (ref 3.6–11.2)

## 2023-04-04 LAB — COMPREHENSIVE METABOLIC PANEL
ALBUMIN: 3.9 g/dL (ref 3.4–5.0)
ALKALINE PHOSPHATASE: 138 U/L — ABNORMAL HIGH (ref 46–116)
ALT (SGPT): 99 U/L — ABNORMAL HIGH (ref 10–49)
ANION GAP: 13 mmol/L (ref 5–14)
AST (SGOT): 67 U/L — ABNORMAL HIGH (ref <=34)
BILIRUBIN TOTAL: 0.3 mg/dL (ref 0.3–1.2)
BLOOD UREA NITROGEN: 18 mg/dL (ref 9–23)
BUN / CREAT RATIO: 24
CALCIUM: 7.8 mg/dL — ABNORMAL LOW (ref 8.7–10.4)
CHLORIDE: 102 mmol/L (ref 98–107)
CO2: 22 mmol/L (ref 20.0–31.0)
CREATININE: 0.76 mg/dL (ref 0.73–1.18)
EGFR CKD-EPI (2021) MALE: >90 mL/min/{1.73_m2} (ref >=60)
GLUCOSE RANDOM: 167 mg/dL — ABNORMAL HIGH (ref 70–99)
POTASSIUM: 3.8 mmol/L (ref 3.4–4.8)
PROTEIN TOTAL: 7.1 g/dL (ref 5.7–8.2)
SODIUM: 137 mmol/L (ref 135–145)

## 2023-04-04 LAB — HIGH SENSITIVITY TROPONIN I - SINGLE: HIGH SENSITIVITY TROPONIN I: 5 ng/L (ref <=53)

## 2023-04-04 MED ADMIN — bupivacaine-EPINEPHrine (PF) (MARCAINE-PF w/EPI) 0.25 %-1:200,000 injection (PF): @ 16:00:00 | Stop: 2023-04-04

## 2023-04-04 MED ADMIN — thrombin-fibrinogen-aprotin-Ca (TISSEEL VHSD) 10 mL topical syringe: TOPICAL | @ 18:00:00 | Stop: 2023-04-04

## 2023-04-04 MED ADMIN — thrombin (recombinant) (RECOTHROM) 5,000 unit topical: TOPICAL | @ 15:00:00 | Stop: 2023-04-04

## 2023-04-04 MED ADMIN — levETIRAcetam (KEPPRA) injection: INTRAVENOUS | @ 15:00:00 | Stop: 2023-04-04

## 2023-04-04 MED ADMIN — labetalol (NORMODYNE) injection: INTRAVENOUS | @ 20:00:00 | Stop: 2023-04-04

## 2023-04-04 MED ADMIN — niCARdipine in NaCl 40 mg/200 mL (0.2 mg/mL) infusion: INTRAVENOUS | @ 22:00:00 | Stop: 2023-04-04

## 2023-04-04 MED ADMIN — phenylephrine 1 mg/10 mL (100 mcg/mL) injection Syrg: INTRAVENOUS | @ 15:00:00 | Stop: 2023-04-04

## 2023-04-04 MED ADMIN — succinylcholine chloride (ANECTINE) injection: INTRAVENOUS | @ 15:00:00 | Stop: 2023-04-04

## 2023-04-04 MED ADMIN — Propofol (DIPRIVAN) injection: INTRAVENOUS | @ 15:00:00 | Stop: 2023-04-04

## 2023-04-04 MED ADMIN — sodium chloride irrigation (NS) 0.9 % irrigation solution: @ 16:00:00 | Stop: 2023-04-04

## 2023-04-04 MED ADMIN — ondansetron (ZOFRAN) injection: INTRAVENOUS | @ 19:00:00 | Stop: 2023-04-04

## 2023-04-04 MED ADMIN — mannitol 20 % infusion: INTRAVENOUS | @ 16:00:00 | Stop: 2023-04-04

## 2023-04-04 MED ADMIN — ceFAZolin in 50ml iso-osmotic dextrose (ANCEF) 2 gram/50 mL IVPB: INTRAVENOUS | @ 16:00:00 | Stop: 2023-04-04

## 2023-04-04 MED ADMIN — fentaNYL (PF) (SUBLIMAZE) injection: INTRAVENOUS | @ 19:00:00 | Stop: 2023-04-04

## 2023-04-04 MED ADMIN — lactated Ringers infusion: INTRAVENOUS | @ 15:00:00 | Stop: 2023-04-04

## 2023-04-04 MED ADMIN — niCARdipine 40 mg in sodium chloride 0.9% 200 mL (0.2 mg/mL) infusion PMB: 0-15 mg/h | INTRAVENOUS | @ 22:00:00

## 2023-04-04 MED ADMIN — acetaminophen (TYLENOL) tablet 1,000 mg: 1000 mg | ORAL | @ 22:00:00 | Stop: 2023-04-06

## 2023-04-04 MED ADMIN — polyethylene glycol (MIRALAX) packet 17 g: 17 g | ORAL | @ 22:00:00 | Stop: 2023-04-12

## 2023-04-04 MED ADMIN — lidocaine (PF) (XYLOCAINE-MPF) 20 mg/mL (2 %) injection: INTRAVENOUS | @ 15:00:00 | Stop: 2023-04-04

## 2023-04-04 MED ADMIN — ezetimibe (ZETIA) tablet 10 mg: 10 mg | ORAL | @ 22:00:00

## 2023-04-04 MED ADMIN — oxyCODONE (ROXICODONE) immediate release tablet 5 mg: 5 mg | ORAL | @ 21:00:00 | Stop: 2023-04-18

## 2023-04-04 MED ADMIN — hydrALAZINE (APRESOLINE) injection 10 mg: 10 mg | INTRAVENOUS | @ 21:00:00 | Stop: 2023-04-04

## 2023-04-04 MED ADMIN — fentaNYL (PF) (SUBLIMAZE) injection: INTRAVENOUS | @ 15:00:00 | Stop: 2023-04-04

## 2023-04-04 MED ADMIN — bacitracin ointment: TOPICAL | @ 15:00:00 | Stop: 2023-04-04

## 2023-04-04 MED ADMIN — lactated Ringers infusion: INTRAVENOUS | @ 18:00:00 | Stop: 2023-04-04

## 2023-04-04 MED ADMIN — zinc sulfate (ZINCATE) capsule 220 mg: 220 mg | ORAL | @ 22:00:00

## 2023-04-04 MED ADMIN — vancomycin (VANCOCIN) IVPB 1000 mg (premix): INTRAVENOUS | @ 16:00:00 | Stop: 2023-04-04

## 2023-04-04 MED ADMIN — ceFAZolin (ANCEF) 1 g in sodium chloride irrigation (NS) 1,000 mL OR irrigation: @ 15:00:00 | Stop: 2023-04-04

## 2023-04-04 MED ADMIN — multivitamins, therapeutic with minerals tablet 1 tablet: 1 | ORAL | @ 22:00:00

## 2023-04-04 MED ADMIN — dexAMETHasone (DECADRON) 4 mg/mL injection 10 mg: 10 mg | INTRAVENOUS | @ 22:00:00

## 2023-04-04 MED ADMIN — insulin lispro (HumaLOG) injection 0-20 Units: 0-20 [IU] | SUBCUTANEOUS | @ 22:00:00

## 2023-04-04 MED ADMIN — phenylephrine 1 mg/10 mL (100 mcg/mL) injection Syrg: INTRAVENOUS | @ 16:00:00 | Stop: 2023-04-04

## 2023-04-04 MED ADMIN — dexAMETHasone (DECADRON) 4 mg/mL injection: INTRAVENOUS | @ 15:00:00 | Stop: 2023-04-04

## 2023-04-04 MED ADMIN — phenylephrine 1 mg/10 mL (100 mcg/mL) injection Syrg: INTRAVENOUS | @ 19:00:00 | Stop: 2023-04-04

## 2023-04-04 MED ADMIN — phenylephrine 20 mg in sodium chloride 0.9% 250 mL (80 mcg/mL) infusion PMB: INTRAVENOUS | @ 16:00:00 | Stop: 2023-04-04

## 2023-04-04 MED ADMIN — sodium chloride (NS) 0.9 % infusion: INTRAVENOUS | @ 15:00:00 | Stop: 2023-04-04

## 2023-04-04 MED ADMIN — matrix hemostatic sealant (FLOSEAL) topical: TOPICAL | @ 15:00:00 | Stop: 2023-04-04

## 2023-04-04 MED ADMIN — remifentanil (ULTIVA) 1,000 mcg in sodium chloride (NS) 20 mL: INTRAVENOUS | @ 15:00:00 | Stop: 2023-04-04

## 2023-04-04 NOTE — Unmapped (Signed)
 VENOUS ACCESS ULTRASOUND PROCEDURE NOTE    Indications:   Poor venous access.    The Venous Access Team has assessed this patient for the placement of a PIV. Ultrasound guidance was necessary to obtain access.     Procedure Details:  Identity of the patient was confirmed via name, medical record number and date of birth. The availability of the correct equipment was verified.    The vein was identified for ultrasound catheter insertion.  Field was prepared with necessary supplies and equipment.  Probe cover and sterile gel utilized.  Insertion site was prepped with chlorhexidine solution and allowed to dry.  The catheter extension was primed with normal saline. A(n) 20 gauge 1.75 catheter was placed in the LAC with 1attempt(s). See LDA for additional details.    Catheter aspirated, 1 mL blood return present. The catheter was then flushed with 10 mL of normal saline. Insertion site cleansed, and dressing applied per manufacturer guidelines. The catheter was inserted with difficulty due to poor vasculature by Melodye Ped, RN.    Primary RN was notified.     Thank you,     Melodye Ped, RN Venous Access Team   [phone]     Workup / Procedure Time:  30 minutes    See images below:

## 2023-04-04 NOTE — Unmapped (Signed)
 Neurocritical Care Admission Note     CODE STATUS:    Code Status: Full Code    HCDM (With Legal Document To Support): Kenneth Fitzgerald - Sister - [phone]  Discussed by neurosurgery    Date of service: 04/04/2023  Hospital Day:  LOS: 0 days        HPI     Kenneth Fitzgerald is a 48 y.o. 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     Patient is now admitted to the Neuroscience ICU for post operative care after RIGHT sided craniotomy for tumor resection, hematoma evacuation, and brachytherapy (Liebenger plates and gamma tiles) on 04/04/2023.       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  Thyroidectomy       SURGICAL HISTORY  Past Surgical History:   Procedure Laterality Date    COLON SURGERY      Partial Colectomy    COLONOSCOPY      COSMETIC SURGERY      Related to excision of Cancer.    PR COLONOSCOPY FLX DX W/COLLJ SPEC WHEN PFRMD N/A 08/29/2021    Procedure: COLONOSCOPY, FLEXIBLE, PROXIMAL TO SPLENIC FLEXURE; DIAGNOSTIC, W/WO COLLECTION SPECIMEN BY BRUSH OR WASH;  Surgeon: Luanne Bras, MD;  [facility];  Service: Gastroenterology    PR COLONOSCOPY W/BIOPSY SINGLE/MULTIPLE N/A 01/09/2018    Procedure: COLONOSCOPY, FLEXIBLE, PROXIMAL TO SPLENIC FLEXURE; WITH BIOPSY, SINGLE OR MULTIPLE;  Surgeon: Vonda Antigua, MD;  [facility]: [facility];  Service: Gastroenterology    PR COLONOSCOPY W/BIOPSY SINGLE/MULTIPLE N/A 08/24/2019    Procedure: COLONOSCOPY, FLEXIBLE, PROXIMAL TO SPLENIC FLEXURE; WITH BIOPSY, SINGLE OR MULTIPLE;  Surgeon: Leland Her, MD;  Location: [facility] PROCEDURES [facility];  Service: Gastroenterology    PR COLONOSCOPY W/BIOPSY SINGLE/MULTIPLE N/A 08/29/2021    Procedure: COLONOSCOPY, FLEXIBLE, PROXIMAL TO SPLENIC FLEXURE; WITH BIOPSY, SINGLE OR MULTIPLE;  Surgeon: Luanne Bras, MD;  Location: [facility];  Service: Gastroenterology    PR COLSC FLX WITH DIRECTED SUBMUCOSAL NJX ANY SBST N/A 08/24/2019    Procedure: COLONOSCOPY, FLEXIBLE, PROXIMAL TO SPLENIC FLEXURE; WITH DIRECTED SUBMUCOSAL INJECTION(S), ANY SUBSTANCE;  Surgeon: Leland Her, MD;  Location: GI PROCEDURES [facility];  Service: Gastroenterology    PR EXPLORATORY OF ABDOMEN N/A 01/24/2020    Procedure: EXPLORATORY LAPAROTOMY, EXPLORATORY CELIOTOMY WITH OR WITHOUT BIOPSY(S);  Surgeon: [name], MD;  Location: MAIN OR [facility];  Service: Trauma  PR FREEING BOWEL ADHESION,ENTEROLYSIS N/A 01/24/2020    Procedure: ENTEROLYSIS (SEPART PROC);  Surgeon: [name], MD;  Location: MAIN OR [facility];  Service: Trauma    PR IMPLANT MESH HERNIA REPAIR/DEBRIDEMENT CLOSURE N/A 01/03/2015    Procedure: IMPLANTATION OF MESH/OTHER PROSTHES INCISION/VENTRAL HERNIA REPAIR/MESH CLOSE DEBRID NECROT SOFT TIS INFECT;  Surgeon: Romero Belling, MD;  Location: MAIN OR [place];  Service: Gastrointestinal    PR LAP, VENTRAL HERNIA REPAIR,REDUCIBLE N/A 04/07/2014    Procedure: LAPAROSCOPY, SURGICAL, REPAIR, VENTRAL, UMBILICAL, SPIGELIAN OR EPIGASTRIC HERNIA, REDUCIBLE;  Surgeon: Romero Belling, MD;  Location: MAIN OR [place];  Service: Gastrointestinal    PR NEGATIVE PRESSURE WOUND THERAPY DME >50 SQ CM N/A 01/24/2020    Procedure: NEG PRESS WOUND TX (VAC ASSIST) INCL TOPICALS, PER SESSION, TSA GREATER THAN/= 50 CM SQUARED;  Surgeon: [name], MD;  Location: MAIN OR [place];  Service: Trauma    PR REMOVAL NODES, NECK,CERV CMPLT Left 12/07/2012    Procedure: CERVICAL LYMPHADENECTOMY (COMPLETE);  Surgeon: Charlott Rakes, MD;  Location: MAIN OR [facility];  Service: Surgical Oncology    PR REMOVAL NODES, NECK,CERV MOD RAD Right 03/29/2013    Procedure: CERVICAL LYMPHADENECTOMY (MODIFIED RADICAL NECK DISSECTION);  Surgeon: Charlott Rakes, MD;  Location: MAIN OR [facility];  Service: Surgical Oncology    PR REPAIR RECURR INCIS [facility] N/A 01/03/2015    Procedure: REPAIR RECURRENT INCISIONAL OR VENTRAL HERNIA; REDUCIBLE;  Surgeon: Romero Belling, MD;  Location: MAIN OR [facility];  Service: Gastrointestinal    PR REPAIR RECURR INCIS HERNIA,STRANG N/A 06/21/2016    Procedure: REPAIR RECURRENT INCISIONAL OR VENTRAL HERNIA; INCARCERATED OR STRANGULATED;  Surgeon: Mickle Asper, MD;  Location: MAIN OR [facility];  Service: Gastrointestinal    PR REPAIR RECURR INCIS HERNIA,STRANG N/A 01/24/2020    Procedure: REPAIR RECURRENT INCISIONAL OR VENTRAL HERNIA; INCARCERATED OR STRANGULATED;  Surgeon: [name], MD;  Location: MAIN OR [place];  Service: Trauma    PR SIGMOIDOSCOPY,FINE NEEDL BX,US GUIDED N/A 01/09/2018    Procedure: SIGMOIDOSCOPY, FLEXIBLE, W/TRANSENDOSCOPIC ULTRASOUND GUIDED NEEDLE ASPIRATION;  Surgeon: Vonda Antigua, MD;  [facility]: [facility];  Service: Gastroenterology    THYROID SURGERY         HOME MEDICATIONS  No current facility-administered medications on file prior to encounter.     Current Outpatient Medications on File Prior to Encounter   Medication Sig    acetaminophen (TYLENOL) 500 MG tablet Take 2 tablets (1,000 mg total) by mouth two (2) times a day.    calcitriol (ROCALTROL) 0.25 MCG capsule Take 1 capsule (0.25 mcg total) by mouth daily.    dexAMETHasone (DECADRON) 6 MG tablet Take 1 tablet (6 mg total) by mouth every twelve (12) hours.    DULoxetine (CYMBALTA) 30 MG capsule Take 1 capsule (30 mg total) by mouth two (2) times a day.    encorafenib (BRAFTOVI) 75 mg capsule Take 6 capsules (450 mg total) by mouth daily.    ezetimibe (ZETIA) 10 mg tablet Take 1 tablet (10 mg total) by mouth daily.    fexofenadine (ALLEGRA ALLERGY) 180 MG tablet Take 1 tablet (180 mg total) by mouth daily as needed (allergies).    fluticasone propionate (FLONASE) 50 mcg/actuation nasal spray 1 spray into each nostril daily.    folic acid (FOLVITE) 1 MG tablet Take 1 tablet (1,000 mcg total) by mouth daily.    levothyroxine (SYNTHROID) 125 MCG tablet Take 3 tablets (375 mcg total) by mouth daily.    ondansetron (ZOFRAN) 8 MG tablet Take  1 tablet (8 mg total) by mouth every eight (8) hours as needed for nausea.    pantoprazole (PROTONIX) 40 MG tablet Take 1 tablet (40 mg total) by mouth every morning.    sulfaSALAzine (AZULFIDINE) 500 mg tablet Take 2 tablets (1,000 mg total) by mouth two (2) times a day.    zinc sulfate (ZINCATE) 220 mg (50 mg elemental zinc) capsule Take 1 capsule (220 mg total) by mouth daily.    binimetinib ([name]) 15 mg tablet Take 3 tablets (45 mg total) by mouth two (2) times a day.    blood sugar diagnostic Strp Dispense 100 blood glucose test strips, ok to sub any brand preferred by insurance/patient, use 3x/day; dispense whatever brand matches with meter.    blood-glucose meter kit Use as instructed; dispense 1 meter, whatever is preferred by insurance    lancets Misc Dispense 100 lancets, ok to sub any brand preferred by insurance/patient, use 3x/day    [EXPIRED] oxyCODONE (ROXICODONE) 5 MG immediate release tablet Take 1 tablet (5 mg total) by mouth every four (4) hours as needed for up to 5 days.       Allergies  Compazine [prochlorperazine] and Coconut    Family History  Family History   Problem Relation Age of Onset    Hyperthyroidism Mother     Osteoporosis Mother     Arrhythmia Mother     Squamous cell carcinoma Mother         basal cell vs squamous cell skin cancer    Arthritis Mother     Cancer Mother     Coronary artery disease Father         s/p CABG    Diabetes Father     Hypertension Father     Prostate cancer Father     Angina Father     Cancer Father     Heart disease Father     Colon cancer Paternal Grandmother     Diabetes Paternal Grandmother     Diabetes Maternal Aunt     Diabetes Paternal Uncle     Diabetes Paternal Aunt     Thyroid disease Neg Hx        Social History  Social History     Tobacco Use    Smoking status: Never     Passive exposure: Never    Smokeless tobacco: Never   Vaping Use    Vaping status: Never Used   Substance Use Topics    Alcohol use: Yes     Comment: social drinker 3 times yearly    Drug use: No         Review of Systems     A comprehensive review of systems was negative.       Assessment and Plan     Kenneth Fitzgerald is a 48 y.o. male admitted to NSICU with a diagnosis of metastatic melanoma s/p resection and brachytherapy.       Neuro:  Mobility goal: 4 (Out of bed to chair assessment    **Brain tumor, right frontal s/p resection and brachytherapy 04/04/2023  - with brain compression and cerebral edema  - CT brain 03/26/23 (personally reviewed) shows redemonstration of the metastatic melanoma with associated vasogenic edema, and 7mm right to left midline shift.  -Stat MRI today  -Sodium goal greater than 135, systolic blood pressure less than 140  - goal eunatremia, euglycemia  - start decadron at 10 mg every 6, with taper over 2 weeks  - keppra 500mg  bidx14d  -  follow up path, frozen consistent with metastatic melanoma   - PT/OT    **Pain  - tylenol prn mild pain  - oxycodone prn moderate pain  - fentanyl prn severe pain    **Family communication   Per neurosurgeon after OR      Cardiovascular:  Admission weight: 140.3Kg  Daily       Hemodynamic goals:  - MAP > 65  - SBP < 140    **Hypertension  Patient does not have a history of hypertension at baseline.  He reports that he does have whitecoat syndrome.  Patient required multiple PRNs to maintain the blood pressure at less than 140.  Unable to reach postoperative goal with PRNs  -Start nicardipine drip to meet above goal  -Troponin and EKG to rule out any cardiac stress  -Manage pain accordingly  -Likely able to have drip once SBP liberalized       Pulmonary    Stable on room air  Extubated without difficulty      FEN  There is no height or weight on file to calculate BMI.    GI prophylaxis: not indicated     Foley catheter: Foley required for accurate continuous I/O's.        **Nutrition  - NPO  - advance diet as tolerated    **Glycemic control  - start FSBS and SSI to prevent hyperglycemia and to assess insulin needs  - check Hgb A1c    **Fluids/electrolytes  - goal euvolemia  - follow strict ins/outs  - check admission chemistry panel and supplement electrolytes as needed    Heme/ID  Current DVT prophylaxis: SCDs, No chemical DVT prophylaxis due to bleeding risk    **Metastatic melanoma  Please see HPI for prior treatment history  -Brain resection frozen is consistent with metastatic melanoma  -Further management and treatment per neurosurgery and oncology  -Not currently neutropenic       Current Access:         -  PIV x 2       - Arterial line: Arterial line required for BP monitoring.       - Central venous line: No central line present.    Tubes and drains:  Patient Lines/Drains/Airways Status       Active Active Lines, Drains, & Airways       Name Placement date Placement time Site Days    Closed/Suction Drain 1 LLQ Bulb 06/21/16  1903  LLQ  2477    Closed/Suction Drain 1 LUQ Bulb 15 Fr. 01/24/20  1622  LUQ  1165    Urethral Catheter Non-latex;Temperature probe 16 Fr. 04/04/23  1122  Non-latex;Temperature probe  less than 1    Peripheral IV 04/04/23 Anterior;Distal;Right;Upper Arm 04/04/23  1049  Arm  less than 1 Peripheral IV 04/04/23 Left Saphenous 04/04/23  1107  Saphenous  less than 1    Arterial Line 04/04/23 Right Radial 04/04/23  1110  Radial  less than 1                           Objective Data     All vital signs and resulted laboratory studies for the past 24 hours have been reviewed.  All ordered medications have been reviewed.    Temp:  [36.6 ??C (97.9 ??F)] 36.6 ??C (97.9 ??F)  Heart Rate:  [106] 106  Resp:  [18] 18  BP: (150)/(108) 150/108    Intake/Output Summary (Last 24 hours) at 04/04/2023  1607  Last data filed at 04/04/2023 1526  Gross per 24 hour   Intake 2163.8 ml   Output 2450 ml   Net -286.2 ml        Physical Exam          General Exam:  General: Awake and alert.   Appears comfortable. No obvious distress.   ENT:  Mucous membranes moist. Oropharynx clear.  Cardiovascular:  Regular rate and rhythm.  No murmurs.   2+ radial pulses bilaterally.    Respiratory: Breathing is comfortable and unlabored.  Lungs are clear to ausculation bilaterally.    Gastrointestinal: Soft, nontender, nondistended.  Extremities: Warm and well-perfused. No cyanosis, clubbing, or edema.  Skin: No obvious rashes or ecchymoses.    Neurological Exam:  Mental Status  LOC: awake and alert  Orientation: person, place and time  Neglect: not present  Speech: normal  Language: fluent with intact naming, repetition, comprehension    Cranial Nerves  Pupils: PERRL  Corneals: present bilaterally  Gaze: normal  Face Motor: normal and symmetric  Cough: strong  Tongue: midline    Motor:   RUE: commands and 5/5  LUE: commands and 5/5  RLE: commands and 5/5  LLE: commands and 5/5     Sensory:    Sensory is intact to light touch    Glasgow Coma Score  Motor: obeys commands = 6  Verbal: oriented = 5  Eyes: eyes open spontaneously = 4        DISPOSITION     The patient requires admission to the [facility] Intensive Care Unit for management of the above conditions.    Estimated Transfer Date:  TBD        BILLING     This is an initial evaluation of the patient.        Judeth Horn stokes, ACNP  Neurocritical Care

## 2023-04-04 NOTE — Unmapped (Signed)
 [facility]   INPATIENT PROGRESS NOTE      Brief History of Present Illness  Wataru Mccowen is a 48 y.o. 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 The management of this patient was discussed in detail with them and we agree with their overall assessment and plan.      ___________________________________________________________________    Neurological Exam  EOV  O3  PERRL  F=  BUE FC antigrav  BLE FC antigrav      The patient's vitals, intake/output, labs, orders, and relevant imaging were reviewed for the last 24 hours.    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

## 2023-04-04 NOTE — Unmapped (Signed)
 Procedure:  GammaTile Intracranial Brachytherapy     NAME: Kenneth Fitzgerald [facility]     AGE: 48 y.o.     PROCEDURE DATE: 04/04/2023     Preoperative Diagnosis: Melanoma Metastatic to brain     Procedure(s) Performed: Intraoperative radiotherapy      Postoperative Diagnosis: Same     Radiation Oncologist: Mina Marble MD, PhD     Resident Radiation Oncologist: Sunday Shams, MD    Radiation Physicist: Derwood Kaplan PhD     Specimens: None     Estimated Blood Loss: None related to intraoperative radiotherapy     Operative Findings: Intraoperative brachytherapy was performed with GammaTile Cs-131 implants.     Procedure: For full details of the surgical procedure, please refer to the surgical operative note from today.    Prior to the resection, I reviewed the patient's history and imaging and in conjunction determined the area of expected postoperative bed. I pre-ordered 11 GammaTiles containing 4 Cs-131 3.5U seeds per tile.      The Radiation Oncology team was notified when the patient was ready to receive intraoperative radiation therapy. Based on preoperative imaging as well as discussion with the surgeons, the site of cancer was identified in the patient. During the resection and utilizing sterile technique, I prepared the GammaTiles for implantation.    After frozen section pathology confirmed tumor recurrence of melanoma and immediately following the surgeon's completion of a maximal safe resection, the areas at risk for tumor residual/recurrence were determined in consultation with the neurosurgical team. At that point sufficient GammaTile implants were placed to cover the dosimetric needs of these areas, avoiding overlap of the tiles. A total of 7 GammaTiles containing a total of 28 Cs-131 seeds were utilized. The dose prescribed was 6000 cGy to 5 mm depth. Implantation took 4.5 minutes. After GammaTile placement, the closure was completed by the neurosurgeon. The unimplanted tiles were returned to the medical physics team.  The room was surveyed before, during and after the procedure for radioactivity, and readings were always within state statute.      Details for GTM101/102 study  Tumor Type: Melanoma  Classification/Molecular Profile: BRAF [id number] +    Tumor #1:   Extent of resection: GTR  Location: Frontal Lobe  Laterality: Right      Total time to place tiles (time from Neurosurgeon touching first tile to placing last tile): __4.5________ (minutes)   Were any tiles flipped over during implantation? []  Yes, how many?____ [x]  No   Why was tile(s) flipped: _________________________________________ [x]  NA   Location of flipped tile(s): _______________________________________ [x]  NA   Were any tiles cut in half? [x]  Yes, how many?__2__ []  No         The patient tolerated the procedure well intraoperatively with no unexpected complications.      Teaching Physician Attestation: I was present and directly participated in all portions of the intraoperative radiotherapy.      Electronically Signed:  Sunday Shams, MD  Radiation Oncology Pgy-5  [facility]  The above case utilized an oral dictation device and could contain spelling errors            ATTENDING ATTESTATION:    I supervised and participated in the entire intraoperative radiotherapy portion of the surgery.  I discussed the findings, assessment, and plan of care with the resident.  I have edited and agree with the findings and plan as documented in the resident's note.        Mina Marble, MD, PhD  Associate Professor  Department of Radiation Oncology  [facility]  [address], [address]  [place], [place] [address]  O: [phone]  P: [phone]

## 2023-04-04 NOTE — Unmapped (Signed)
MRI on hold until screening form is completed in epic. Please answer all the questions in the form. If patient isn't able to answer the questions, please have immediate family member to help answer the questions and list their name and their number in the form.      Patient needs to be in a hospital gown. IV should be med locked or have 30 feet of extension on. All medication patches should be removed prior to coming to MRI.      If unable to obtain reliable history from patient or family, the patient may be able to be cleared through other radiology studies such as X-ray or CT.  Please call the MRI Department at [phone] Option 1, then Option 1 again if unable to fill out form.

## 2023-04-04 NOTE — Unmapped (Signed)
 Neurosurgery Operative Note    Date of Surgery:    04/04/23    Attending Surgeon:   Salli Quarry, MD PhD    Resident Surgeon:  Demetria Pore, MD    Preoperative Diagnoses:  Metastatic melanoma to the brain      Large R hematoma with significant brain compression     Postoperative Diagnosis:  Same    Procedure(s) Performed:     1. RIGHT Craniotomy for resection of tumor      2. Clot evacuation      3. Brachytherapy      4. Continuous intraoperative neuromonitoring      5. Continuous intraoperative neuronavigation      6. Use of operating microscope with microsurgical technique       7. Intra-op ultrasonography    Implants:    Liebenger plates and gamma tiles      Specimens:      ID Type Source Tests Collected by Time Destination   1 : Brain Tumor Tissue Brain SURGICAL PATHOLOGY Desmond Dike, MD 04/04/2023 1332    2 : Brain Tumor Tissue Brain SURGICAL PATHOLOGY Desmond Dike, MD 04/04/2023 1333    3 : Brain Tumor from trap Tissue Brain SURGICAL PATHOLOGY Desmond Dike, MD 04/04/2023 1524         Estimated Blood Loss:  50 mL    Drains:   None    Skin Closure:   Staples    Complications:   None        Indications for Surgery: Kenneth Fitzgerald is a 48 y.o. male who presented with metastatic melanoma to the brain and large right frontal hematoma.  MRI revealed multiple brain lesions with a hematoma associated with a cingulate lesion in the R frontal lobe. Due to the mass effect and need for tissue diagnosis, we recommended R sided craniotomy for resection. We discussed the procedure in detail with the family, who understood the benefits and risks, which includes bleeding, infection, stroke, seizures, death and they consented to surgery as described.     Operative Findings: very well organized hematoma, resection of met, unroofed ventricle repaired with surgicel and tisseal     Procedure Details: The patient was identified in the OR holding area and transported to the operating room. Upon entering the room a timeout was performed. We verified patient identification, procedure, site and side of surgery as well as antibiotic dosing. The patient was then placed under general anesthesia and all lines were placed and secured by the Anesthesiology Service. The head of the bed was turned 180 degrees and the patient was positioned in a mayfield head holder. The stereotactic navigation system was registered to the face and confirmed accurate based on anatomic landmarks. The anterior scalp was clipped, a clorhexidine pre-scrub was performed, and a parasagital incision was marked and ensured anterior to the prior scars.      The patient's scalp was then prepped and draped in the usual sterile manner. 2 g of intravenous cefazolin were administered. 0.25% Marcaine with epinephrine was infiltrated into the scalp. Intravenous dexamethasone, keppra, and mannitol were administered.     We started the procedure by using a 15 blade. The incision was carried deep using electrocautery. Self retaining retractors were placed. The standard navigation was used in order to demarcate the periphery of the tumor, in order to plan the craniotomy. Two midline bur holes were placed directly adjacent to the superior sagittal sinus. The epidural plane was established using a  combination of right angle dural dissectors and Penfield 3. Next, the router was used in order to complete the craniotomy. The bone flap was lifted off the field and placed onto the back table. The field was generously irrigated. Hemostasis was established. The dura was tented out around the periphery using 4-0 nurolon. The ultrasoudn was brought in and we identified the spot on the cortex where the hematoma came to surface, triple checked this with nav and coagulated the pia.      The microscope was draped and brought into the field. The remainder of the procedure proceeded with microsurgical technique. Microscissors completed the corticectomy allowing the hematoma to egress. We suctioned out a large hematoma with the same consistency of motor oil. With the hematoma cleared out, we were able to identify the melanotic met deep in the field.     We then began to identify the medial margin by retracting the frontal lobe laterally and traversing the falx forward. We identified the pericallosal and callosomarginal branches of the ACA and dissected them away from the frontal lobe. With the vessels secure, we then used navigation to demarcate the medial most extent of the tumor. A corticectomy was made and we internally debulked the tumor after sending specimen for frozen. Once we reached the pial margin, we returned into our hematoma cavity and isolated the met circumferentially with blunt dissection. We ensured no en passage vessels existed and then truncated the met as close to the pial margin as we could. We noted an expected violation of the ependyma and repaired this by placing surgicel over the defect and using tisseal to seal the ventricular defect.      The field was generously irrigated with saline. FloSeal was placed at the cavity and allowed to sit for several minutes. This was then irrigated out. There was no significant bleeding noted at this time and all irrigation returned clear. Surgicel was placed along the resection cavity.     At this point we placed gamma tiles on the met and within the surgical cavity both along the falx and in the hematoma cavity. The dura was reapproximated using 4-0 Nurolon suture.      The bone flap was plated using the Leibinger titanium set and 4 mm titanium screws. The subgaleal space was irrigated. The galea was reapproximated with buried interrupted Vicryl sutures and the skin was closed with staples. Bacitracin ointment and island dressing was applied to the incision. The drapes were then taken down, the mayfield was removed and the patient was turned over to anesthesia. The patient was then extubated and transferred to the [facility] intubated in stable condition. All counts were correct at the case

## 2023-04-05 MED ADMIN — acetaminophen (TYLENOL) tablet 1,000 mg: 1000 mg | ORAL | @ 22:00:00 | Stop: 2023-04-06

## 2023-04-05 MED ADMIN — dexAMETHasone (DECADRON) tablet 6 mg: 6 mg | ORAL | @ 16:00:00

## 2023-04-05 MED ADMIN — morphine injection 2 mg: 2 mg | INTRAVENOUS | @ 04:00:00

## 2023-04-05 MED ADMIN — ascorbic acid (vitamin C) (VITAMIN C) tablet 500 mg: 500 mg | ORAL | @ 01:00:00 | Stop: 2023-04-19

## 2023-04-05 MED ADMIN — dexAMETHasone (DECADRON) tablet 6 mg: 6 mg | ORAL | @ 22:00:00

## 2023-04-05 MED ADMIN — acetaminophen (TYLENOL) tablet 1,000 mg: 1000 mg | ORAL | @ 13:00:00 | Stop: 2023-04-06

## 2023-04-05 MED ADMIN — loratadine (CLARITIN) tablet 10 mg: 10 mg | ORAL | @ 13:00:00

## 2023-04-05 MED ADMIN — oxyCODONE (ROXICODONE) immediate release tablet 5 mg: 5 mg | ORAL | @ 20:00:00 | Stop: 2023-04-18

## 2023-04-05 MED ADMIN — ezetimibe (ZETIA) tablet 10 mg: 10 mg | ORAL | @ 13:00:00

## 2023-04-05 MED ADMIN — oxyCODONE (ROXICODONE) immediate release tablet 5 mg: 5 mg | ORAL | @ 15:00:00 | Stop: 2023-04-18

## 2023-04-05 MED ADMIN — DULoxetine (CYMBALTA) DR capsule 30 mg: 30 mg | ORAL | @ 01:00:00

## 2023-04-05 MED ADMIN — polyethylene glycol (MIRALAX) packet 17 g: 17 g | ORAL | @ 16:00:00 | Stop: 2023-04-12

## 2023-04-05 MED ADMIN — gadopiclenol (ELUCIREM,VUEWAY) injection 10 mL: 10 mL | INTRAVENOUS | @ 05:00:00 | Stop: 2023-04-05

## 2023-04-05 MED ADMIN — senna (SENOKOT) tablet 2 tablet: 2 | ORAL | @ 16:00:00 | Stop: 2023-04-05

## 2023-04-05 MED ADMIN — ascorbic acid (vitamin C) (VITAMIN C) tablet 500 mg: 500 mg | ORAL | @ 13:00:00 | Stop: 2023-04-19

## 2023-04-05 MED ADMIN — insulin lispro (HumaLOG) injection 0-20 Units: 0-20 [IU] | SUBCUTANEOUS | @ 17:00:00

## 2023-04-05 MED ADMIN — multivitamins, therapeutic with minerals tablet 1 tablet: 1 | ORAL | @ 13:00:00

## 2023-04-05 MED ADMIN — acetaminophen (TYLENOL) tablet 1,000 mg: 1000 mg | ORAL | @ 06:00:00 | Stop: 2023-04-06

## 2023-04-05 MED ADMIN — zinc sulfate (ZINCATE) capsule 220 mg: 220 mg | ORAL | @ 13:00:00

## 2023-04-05 MED ADMIN — hydrALAZINE (APRESOLINE) injection 10 mg: 10 mg | INTRAVENOUS | @ 01:00:00

## 2023-04-05 MED ADMIN — DULoxetine (CYMBALTA) DR capsule 30 mg: 30 mg | ORAL | @ 13:00:00

## 2023-04-05 MED ADMIN — insulin lispro (HumaLOG) injection 0-20 Units: 0-20 [IU] | SUBCUTANEOUS | @ 02:00:00

## 2023-04-05 MED ADMIN — ondansetron (ZOFRAN) tablet 4 mg: 4 mg | ORAL | @ 02:00:00

## 2023-04-05 MED ADMIN — sulfaSALAzine (AZULFIDINE) tablet 1,000 mg: 1000 mg | ORAL | @ 12:00:00

## 2023-04-05 MED ADMIN — morphine injection 2 mg: 2 mg | INTRAVENOUS

## 2023-04-05 MED ADMIN — dexAMETHasone (DECADRON) 4 mg/mL injection 10 mg: 10 mg | INTRAVENOUS | @ 02:00:00

## 2023-04-05 MED ADMIN — niCARdipine 40 mg in sodium chloride 0.9% 200 mL (0.2 mg/mL) infusion PMB: 0-15 mg/h | INTRAVENOUS | @ 04:00:00 | Stop: 2023-04-05

## 2023-04-05 MED ADMIN — bacitracin ointment: TOPICAL | @ 16:00:00

## 2023-04-05 MED ADMIN — labetalol (NORMODYNE) injection: 10 mg | INTRAVENOUS | @ 04:00:00 | Stop: 2023-04-05

## 2023-04-05 MED ADMIN — levETIRAcetam (KEPPRA) tablet 500 mg: 500 mg | ORAL | @ 13:00:00 | Stop: 2023-04-18

## 2023-04-05 MED ADMIN — niCARdipine 40 mg in sodium chloride 0.9% 200 mL (0.2 mg/mL) infusion PMB: 0-15 mg/h | INTRAVENOUS | @ 08:00:00 | Stop: 2023-04-05

## 2023-04-05 MED ADMIN — sulfaSALAzine (AZULFIDINE) tablet 1,000 mg: 1000 mg | ORAL | @ 01:00:00

## 2023-04-05 MED ADMIN — levothyroxine (SYNTHROID) tablet 375 mcg: 375 ug | ORAL | @ 12:00:00

## 2023-04-05 MED ADMIN — pantoprazole (Protonix) EC tablet 40 mg: 40 mg | ORAL | @ 12:00:00 | Stop: 2023-04-05

## 2023-04-05 MED ADMIN — levETIRAcetam (KEPPRA) tablet 500 mg: 500 mg | ORAL | @ 01:00:00 | Stop: 2023-04-18

## 2023-04-05 MED ADMIN — folic acid (FOLVITE) tablet 1,000 mcg: 1000 ug | ORAL | @ 13:00:00

## 2023-04-05 MED ADMIN — niCARdipine 40 mg in sodium chloride 0.9% 200 mL (0.2 mg/mL) infusion PMB: 0-15 mg/h | INTRAVENOUS | @ 01:00:00

## 2023-04-05 MED ADMIN — dexAMETHasone (DECADRON) 4 mg/mL injection 10 mg: 10 mg | INTRAVENOUS | @ 09:00:00 | Stop: 2023-04-05

## 2023-04-05 MED ADMIN — oxyCODONE (ROXICODONE) immediate release tablet 5 mg: 5 mg | ORAL | @ 02:00:00 | Stop: 2023-04-18

## 2023-04-05 NOTE — Unmapped (Signed)
 Radiation Oncology note    Mr. Barbato was seen on POD #1 following resection of a hemorrhagic R frontal metastasis with intraoperative placement of Cs-131 GammaTiles.    He is overall doing well and notes only mild L-sided weakness that is new since pre-op.  Immediately post-op he had a headache when turning his head right, but this has now improved.    Brief exam:  General: AAO x 3, conversant and responds to questions appropriately  HEENT: Craniotomy site C/D/I  Neuro: EOMI, CN grossly intact, strength 5/5 in RUE/LUE, 4+/5 in LUE/LLE    KPS: 70    For GTM-101:   Adverse events:   --Muscle weakness left-sided, CTCAE v5 grade 2, expected, related to surgery, unrelated to GammaTile  --Headache, CTCAE v5 grade 1, expected, related to surgery, unrelated to Everrett Coombe, MD, PhD  Associate Professor  Department of Radiation Oncology  [facility]  [address], [address]  [place], [place] [address]  O: [phone]  P: [phone]
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
## 2023-04-05 NOTE — Unmapped (Signed)
 CRANIAL NEUROSURGERY   INPATIENT PROGRESS NOTE      Brief History of Present Illness  Kenneth Fitzgerald is a 48 y.o. male with metastatic melanoma who is now sp R craniotomy for resection (3/21)    Subjective/Interval History  CT/MRI done; NRB for pneumocephalus    Interval Imaging Reviewed  CT head / MRI brain personally reviewed demonstrating: gamma tiles, lesions resection without obvious residual, multifocal mets stable , frontal pneumocephalus    Neurological Assessment and Plan  **Metastasis to brain (CMS-HCC)  POD 1 R craniotomy  - Na>135  - SBP<140 -> abp<160  - dexamethasone 10q6, taper over 2 weeks   - post op MRI and CT  - keppra 500mg  bidx14d  - follow up path, frozen consistent with metastatic melanoma   - PT/OT    Anticoagulant/Antiplatelet needs: None    Disposition: [facility]    This patient is co-managed with the [facility] Service. The management of this patient was discussed in detail with them and we agree with their overall assessment and plan.      ___________________________________________________________________    Neurological Exam  EOSp  O3  PERRL  EOMI  F=  TML  RUE/RLE 5  LUE/LLE 4+  Mild L drift  SILT      The patient's vitals, intake/output, labs, orders, and relevant imaging were reviewed for the last 24 hours.    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

## 2023-04-05 NOTE — Unmapped (Signed)
 PHYSICAL THERAPY  Evaluation (04/05/23 1104)          Patient Name:  Kenneth Fitzgerald [facility]       Medical Record Number: [id number]   Date of Birth: [birth year]  Sex: Male        Post-Discharge Physical Therapy Recommendations:  PT Post Acute Discharge Recommendations: 5x weekly, High intensity   Equipment Recommendation  PT DME Recommendations: Defer to post acute          Treatment Diagnosis: Abnormalities of gait and mobility, Difficulty in walking, Generalized muscle weakness, Unsteadiness on feet        Activity Tolerance: Limited by pain, Tolerated treatment well     ASSESSMENT  Problem List: Gait deviation, Fall risk, Decreased mobility, Decreased cognition, Impaired sensation, Impaired balance, Pain, Decreased strength      Assessment : Kenneth Fitzgerald is a 48 y.o. male with metastatic melanoma who is now sp R craniotomy for resection (3/21). PT to continue to follow patient to maximize functional mobility. Recommend PT 5x/week (high intensity) post-discharge to address mobility deficits.      Today's Interventions: PT evaluation; educated patient in the acute PT role/POC and the importance of staff assistance with all mobility to ensure safety; bed mobility, transfers, gait     Personal Factors/Comorbidities Present: 1-2   Examination of Body System: Musculoskeletal, Neurological, Communication, Activity/participation, Integumentary  Clinical Presentation: Evolving    Clinical Decision Making: Moderate        PLAN  Planned Frequency of Treatment: Plan of Care Initiated: 04/05/23  1-2x per day Weekly Frequency: 3-4 days per week  Planned Treatment Duration: 04/19/23     Planned Interventions: Education (Patient/Family/Caregiver), Gait training, Neuromuscular re-education, Home exercise program, Self-care / Home Management training, Therapeutic Exercise, Therapeutic Activity (stairs training)     Goals:   Patient and Family Goals: none stated     SHORT GOAL #1: pt will perform all functional transfers modified indep with LRAD               Time Frame : 2 weeks  SHORT GOAL #2: pt will ambulate x250 feet modified indep with LRAD              Time Frame : 2 weeks  SHORT GOAL #3: pt will negotiate x4 steps with railing indep              Time Frame : 2 weeks                       Long Term Goal #1: pt will ambulate >1000 feet indep  Time Frame: 4 weeks     Prognosis:  Good  Positive Indicators: PLOF  Barriers to Discharge: Decreased caregiver support, Impaired Balance, Pain, Functional strength deficits, Gait instability, Cognitive deficits     SUBJECTIVE  Communication Preference: Verbal     Patient reports: patient in agreement with PT session  Pain Comments: patient reporting a 6/10 headache; RN (Annelise) provided pain medication during session        Prior Functional Status: patient reports he is independent with all mobility and ADLs without a device; works at a [employer]; reports one fall about 10 days ago when he tripped and fell  Equipment available at home: None       Objective Findings  Precautions / Restrictions  Precautions: Falls precautions, HOB elevated  Weight Bearing Status: Non-applicable  Required Braces or Orthoses: Non-applicable  Precautions / Restrictions comments: HOB >30  Medical Tests / Procedures: reviewed in EPIC  Equipment / Environment: Vascular access (PIV, TLC, Port-a-cath, PICC), Telemetry     Vitals/Orthostatics : VSS per monitor     Living Situation  Living Environment: House  Lives With: Alone  Home Living: One level home, Stairs to enter with rails, Comfort height commode seat, Tub/shower unit  Rail placement (outside): Bilateral rails out of reach  Number of Stairs to Enter (outside): 4  Caregiver Identified?: No      Cognition comment: patient alert but with delayed response times; difficulty following multistep commands  Visual/Perception:  (patient denies vision changes but often closing/opening eyes during session)  Hearing: No deficit identified     Skin Inspection: Incision C/D/I     Upper Extremities  UE comment: grossly 3/5 BUE    Lower Extremities  LE comment: grossly 3/5 BLE; LLE slightly weaker than RLE noted during gait          Coordination:  (ataxic gait)  Sensation:  (patient reporting decreased sensation L sided extremities)    Static Sitting-Level of Assistance: Standby Camera operator of Assistance: Administrator, sports of Assistance: Minimum assistance  Dynamic Standing - Level of Assistance: Minimum assistance      Bed Mobility comments: supine to sitting EOB with HOB elevated with min A at upper trunk, cues for body mechanics, safety and sequencing     Transfer comments: sitting to and from standing and standing pivoting transfers at bedside and bedside chair with min A, support underneath BUE at upper trunk, facilitation for anterior trunk leaning and bringing trunk control over base of support in stance, patient tending to lean posteriorly upon initial stance; step by step cues for sequencing with facilitation for eccentric control      Gait Level of Assistance: Minimal assist, patient does 75% or more  Gait Assistive Device:  (support underneath BUE at upper trunk)  Gait Distance Ambulated (ft): 24 ft  Skilled Treatment Performed: distance limited due to headache; room level only; patient with wide base of support, inconsistent foot clearance L foot with noted ataxic gait pattern; manual facilitation for weight shifting, trunk control and step by step cues for sequencing     Stairs: not addressed this session          Patient at end of session: Alarm activated, All needs in reach, In chair, Notified Nurse, Lines intact, Staff present [facility]) aware of patient's status; CNA Insurance risk surveyor) present in room)    Physical Therapy Session Duration  PT Individual [mins]: 36          AM-PAC-6 click  Help currently need turning over In bed?: A Little - Minimal/Contact Guard Assist/Supervision  Help currently needed sitting down/standing up from chair with arms? : A Little - Minimal/Contact Guard Assist/Supervision  Help currently needed moving from supine to sitting on edge of bed?: A Little - Minimal/Contact Guard Assist/Supervision  Help currently needed moving to and from bed from wheelchair?: A Little - Minimal/Contact Guard Assist/Supervision  Help currently needed walking in a hospital room?: A Little - Minimal/Contact Guard Assist/Supervision  Help currently needed climbing 3-5 steps with railing?: A lot - Maximum/Moderate Assistance    Basic Mobility Score 6 click: 17    6 click Score (in points): % of Functional Impairment, Limitation, Restriction  6: 100% impaired, limited, restricted  7-8: At least 80%, but less than 100% impaired, limited restricted  9-13: At least 60%, but less than 80% impaired, limited restricted  14-19: At least 40%, but less than 60% impaired, limited restricted  20-22: At least 20%, but less than 40% impaired, limited restricted  23: At least 1%, but less than 20% impaired, limited restricted  24: 0% impaired, limited restricted    'AM-PAC' forms are Copyright protected by The Trustees of [facility]         I attest that I have reviewed the above information.  Signed: Felipa Emory, PT  Filed 04/05/2023

## 2023-04-05 NOTE — Unmapped (Signed)
 Neurocritical Care Daily Progress Note     CODE STATUS:    Code Status: Full Code    HCDM (With Legal Document To Support): Huntley,Tammy - Sister - [phone]  Discussed by neurosurgery    Date of service: 04/05/2023  Hospital Day:  LOS: 1 day        HPI     Kenneth Fitzgerald is a 48 y.o. 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     Patient is now admitted to the Neuroscience ICU for post operative care after RIGHT sided craniotomy for tumor resection, hematoma evacuation, and brachytherapy (Liebenger plates and gamma tiles) on 04/04/2023.       Assessment and Plan     Kenneth Fitzgerald is a 48 y.o. male admitted to NSICU with a diagnosis of metastatic melanoma s/p resection and brachytherapy.     Interval Events:   - post-op MRI complete  - nicardipine weaned    Neuro:  Mobility goal: 6 (Ambulate in hallway)    **Brain tumor, right frontal s/p resection and brachytherapy 04/04/2023  - with brain compression and cerebral edema  - CT brain 03/26/23 (personally reviewed) shows redemonstration of the metastatic melanoma with associated vasogenic edema, and 7mm right to left midline shift.  -MRI today  - Sodium goal greater than 135  - goal eunatremia, euglycemia  - dexamethasone at 10 mg every 6, with taper over 2 weeks per SRN   - levetiracetam 500mg  bidx14d  - follow up path, frozen consistent with metastatic melanoma   - PT/OT    **Pain  - tylenol prn mild pain  - oxycodone prn moderate pain  - fentanyl prn severe pain  - duloxetine 30 mg BID     Cardiovascular:  Admission weight: 140.3Kg  Daily Weight: (!) 133.7 kg (294 lb 12.1 oz)     Hemodynamic goals:  - MAP > 65  - SBP < 160    **Hypertension  Patient does not have a history of hypertension at baseline.  He reports that he does have whitecoat syndrome.  Patient required multiple PRNs to maintain the blood pressure at less than 140.  Unable to reach postoperative goal with PRNs  -off nicardipine  -Troponin 5, EKG reassuring   -Manage pain accordingly    Pulmonary    Non-rebreather for pneumocephalus, likely x24 hours     FEN  Body mass index is 37.84 kg/m??.    GI prophylaxis: not indicated     Foley catheter: Foley to be removed today.      **Nutrition  - start PO diet    **Glycemic control  - start FSBS and SSI to prevent hyperglycemia and to assess insulin needs  - Hgb A1c 05/10/20 5.4    **Fluids/electrolytes  - goal euvolemia  - follow strict ins/outs  - check admission chemistry panel and supplement electrolytes as needed    Heme/ID  Current DVT prophylaxis: SCDs, No chemical DVT prophylaxis due to bleeding risk would start if still in post-op day #2 (3/23)    **Metastatic melanoma  Please see HPI for prior treatment history  -Brain resection frozen is consistent with metastatic melanoma  -Further management and treatment per neurosurgery and oncology  -Not currently neutropenic     - leukocytosis likely in setting of steroids + OR, no infectious concerns     Current Access:         -  PIV x 2       - Arterial line: Arterial line to be removed today.       - Central venous line: No central line present.    Tubes and drains:  Patient Lines/Drains/Airways Status       Active Active Lines, Drains, & Airways       Name Placement date Placement time Site Days    Closed/Suction Drain 1 LLQ Bulb 06/21/16  1903  LLQ  2478    Closed/Suction Drain 1 LUQ Bulb 15 Fr. 01/24/20  1622  LUQ  1166    Urethral Catheter Non-latex;Temperature probe 16 Fr. 04/04/23  1122  Non-latex;Temperature probe  less than 1    Peripheral IV 04/04/23 Anterior;Distal;Right;Upper Arm 04/04/23  1049  Arm  less than 1    Peripheral IV 04/04/23 Left Saphenous 04/04/23  1107  Saphenous  less than 1    Peripheral IV 04/04/23 Left Antecubital 04/04/23  2122  Antecubital  less than 1    Arterial Line 04/04/23 Right Radial 04/04/23  1110  Radial  less than 1                           Objective Data     All vital signs and resulted laboratory studies for the past 24 hours have been reviewed.  All ordered medications have been reviewed.    Temp:  [36.6 ??C (97.9 ??F)-37.8 ??C (100 ??F)] (P) 37.2 ??C (99 ??F)  Heart Rate:  [78-107] 101  SpO2 Pulse:  [78-123] 100  Resp:  [14-32] 23  BP: (117-151)/(37-108) 133/72  MAP (mmHg):  [62-96] 85  A BP-2: (99-174)/(43-78) 126/75  MAP:  [58 mmHg-103 mmHg] 89 mmHg  SpO2:  [90 %-99 %] 99 %    Intake/Output Summary (Last 24 hours) at 04/05/2023 0818  Last data filed at 04/05/2023 0600  Gross per 24 hour   Intake 4008.22 ml   Output 4470 ml   Net -461.78 ml        Physical Exam          General Exam:  General: Awake and alert.   Appears comfortable. No obvious distress.   ENT:  Mucous membranes moist. Oropharynx clear.  Cardiovascular:  Regular rate and rhythm.  No murmurs.   2+ radial pulses bilaterally.    Respiratory: Breathing is comfortable and unlabored.  Lungs are clear to ausculation bilaterally.    Gastrointestinal: Soft, nontender, nondistended.  Extremities: Warm and well-perfused. No cyanosis, clubbing, or edema.  Skin: No obvious rashes or ecchymoses.    Neurological Exam:  Mental Status  LOC: awake and alert  Orientation: person, place and time  Neglect: not present  Speech: normal  Language: fluent with intact naming, repetition, comprehension    Cranial Nerves  Pupils: PERRL  Corneals: present bilaterally  Gaze: normal  Face Motor: normal and symmetric  Cough: strong  Tongue: midline    Motor:   RUE: commands and 5/5  LUE: commands and 5/5  RLE: commands and 5/5  LLE: commands and 5/5   L Hand grip 4/5, stable from prior     Sensory:    Sensory is intact to light touch    Glasgow Coma Score  Motor: obeys commands = 6  Verbal: oriented = 5  Eyes: eyes open spontaneously = 4        DISPOSITION      The patient is stable to transfer to an acute care bed.  Estimated Transfer Date: 3/22        BILLING     This is a subsequent evaluation of the patient.        Joanna Hews, MD  Neurocritical Care Attending

## 2023-04-05 NOTE — Unmapped (Signed)
 Patient placed on 15L NRB due to pneumocephalus.

## 2023-04-06 LAB — CBC
HEMATOCRIT: 37 % — ABNORMAL LOW (ref 39.0–48.0)
HEMOGLOBIN: 12.6 g/dL — ABNORMAL LOW (ref 12.9–16.5)
MEAN CORPUSCULAR HEMOGLOBIN CONC: 34.2 g/dL (ref 32.0–36.0)
MEAN CORPUSCULAR HEMOGLOBIN: 30.3 pg (ref 25.9–32.4)
MEAN CORPUSCULAR VOLUME: 88.7 fL (ref 77.6–95.7)
MEAN PLATELET VOLUME: 8.2 fL (ref 6.8–10.7)
PLATELET COUNT: 263 10*9/L (ref 150–450)
RED BLOOD CELL COUNT: 4.17 10*12/L — ABNORMAL LOW (ref 4.26–5.60)
RED CELL DISTRIBUTION WIDTH: 17.7 % — ABNORMAL HIGH (ref 12.2–15.2)
WBC ADJUSTED: 16 10*9/L — ABNORMAL HIGH (ref 3.6–11.2)

## 2023-04-06 LAB — BASIC METABOLIC PANEL
ANION GAP: 12 mmol/L (ref 5–14)
BLOOD UREA NITROGEN: 20 mg/dL (ref 9–23)
BUN / CREAT RATIO: 30
CALCIUM: 7.4 mg/dL — ABNORMAL LOW (ref 8.7–10.4)
CHLORIDE: 105 mmol/L (ref 98–107)
CO2: 23 mmol/L (ref 20.0–31.0)
CREATININE: 0.67 mg/dL — ABNORMAL LOW (ref 0.73–1.18)
EGFR CKD-EPI (2021) MALE: >90 mL/min/{1.73_m2} (ref >=60)
GLUCOSE RANDOM: 175 mg/dL (ref 70–179)
POTASSIUM: 4.1 mmol/L (ref 3.4–4.8)
SODIUM: 140 mmol/L (ref 135–145)

## 2023-04-06 MED ADMIN — bacitracin ointment: TOPICAL | @ 01:00:00

## 2023-04-06 MED ADMIN — polyethylene glycol (MIRALAX) packet 17 g: 17 g | ORAL | @ 13:00:00 | Stop: 2023-04-06

## 2023-04-06 MED ADMIN — DULoxetine (CYMBALTA) DR capsule 30 mg: 30 mg | ORAL | @ 01:00:00

## 2023-04-06 MED ADMIN — insulin lispro (HumaLOG) injection 0-20 Units: 0-20 [IU] | SUBCUTANEOUS | @ 01:00:00

## 2023-04-06 MED ADMIN — oxyCODONE (ROXICODONE) immediate release tablet 10 mg: 10 mg | ORAL | @ 11:00:00 | Stop: 2023-04-12

## 2023-04-06 MED ADMIN — insulin lispro (HumaLOG) injection 0-20 Units: 0-20 [IU] | SUBCUTANEOUS | @ 21:00:00

## 2023-04-06 MED ADMIN — labetalol (NORMODYNE) injection: 10 mg | INTRAVENOUS | @ 13:00:00

## 2023-04-06 MED ADMIN — dexAMETHasone (DECADRON) tablet 6 mg: 6 mg | ORAL | @ 21:00:00

## 2023-04-06 MED ADMIN — sulfaSALAzine (AZULFIDINE) tablet 1,000 mg: 1000 mg | ORAL

## 2023-04-06 MED ADMIN — ascorbic acid (vitamin C) (VITAMIN C) tablet 500 mg: 500 mg | ORAL | @ 01:00:00 | Stop: 2023-04-19

## 2023-04-06 MED ADMIN — levothyroxine (SYNTHROID) tablet 375 mcg: 375 ug | ORAL | @ 10:00:00

## 2023-04-06 MED ADMIN — morphine injection 2 mg: 2 mg | INTRAVENOUS | @ 09:00:00

## 2023-04-06 MED ADMIN — acetaminophen (TYLENOL) tablet 1,000 mg: 1000 mg | ORAL | @ 05:00:00 | Stop: 2023-04-06

## 2023-04-06 MED ADMIN — levETIRAcetam (KEPPRA) tablet 500 mg: 500 mg | ORAL | @ 12:00:00 | Stop: 2023-04-18

## 2023-04-06 MED ADMIN — acetaminophen (TYLENOL) tablet 1,000 mg: 1000 mg | ORAL | @ 12:00:00 | Stop: 2023-04-06

## 2023-04-06 MED ADMIN — hydrALAZINE (APRESOLINE) injection 10 mg: 10 mg | INTRAVENOUS | @ 12:00:00

## 2023-04-06 MED ADMIN — captopril (CAPOTEN) tablet 12.5 mg: 12.5 mg | ORAL | @ 13:00:00 | Stop: 2023-04-06

## 2023-04-06 MED ADMIN — oxyCODONE (ROXICODONE) immediate release tablet 5 mg: 5 mg | ORAL | @ 20:00:00 | Stop: 2023-04-18

## 2023-04-06 MED ADMIN — sulfaSALAzine (AZULFIDINE) tablet 1,000 mg: 1000 mg | ORAL | @ 12:00:00

## 2023-04-06 MED ADMIN — loratadine (CLARITIN) tablet 10 mg: 10 mg | ORAL | @ 12:00:00

## 2023-04-06 MED ADMIN — oxyCODONE (ROXICODONE) immediate release tablet 5 mg: 5 mg | ORAL | @ 05:00:00 | Stop: 2023-04-18

## 2023-04-06 MED ADMIN — ascorbic acid (vitamin C) (VITAMIN C) tablet 500 mg: 500 mg | ORAL | @ 12:00:00 | Stop: 2023-04-19

## 2023-04-06 MED ADMIN — labetalol (NORMODYNE) injection: 10 mg | INTRAVENOUS | @ 23:00:00

## 2023-04-06 MED ADMIN — levETIRAcetam (KEPPRA) tablet 500 mg: 500 mg | ORAL | @ 01:00:00 | Stop: 2023-04-18

## 2023-04-06 MED ADMIN — senna (SENOKOT) tablet 2 tablet: 2 | ORAL | @ 12:00:00

## 2023-04-06 MED ADMIN — ezetimibe (ZETIA) tablet 10 mg: 10 mg | ORAL | @ 12:00:00

## 2023-04-06 MED ADMIN — enoxaparin (LOVENOX) syringe 40 mg: 40 mg | SUBCUTANEOUS | @ 21:00:00

## 2023-04-06 MED ADMIN — folic acid (FOLVITE) tablet 1,000 mcg: 1000 ug | ORAL | @ 12:00:00

## 2023-04-06 MED ADMIN — insulin lispro (HumaLOG) injection 0-20 Units: 0-20 [IU] | SUBCUTANEOUS | @ 12:00:00

## 2023-04-06 MED ADMIN — dexAMETHasone (DECADRON) tablet 6 mg: 6 mg | ORAL | @ 05:00:00

## 2023-04-06 MED ADMIN — insulin lispro (HumaLOG) injection 0-20 Units: 0-20 [IU] | SUBCUTANEOUS | @ 16:00:00

## 2023-04-06 MED ADMIN — polyethylene glycol (MIRALAX) packet 17 g: 17 g | ORAL | @ 16:00:00 | Stop: 2023-04-06

## 2023-04-06 MED ADMIN — zinc sulfate (ZINCATE) capsule 220 mg: 220 mg | ORAL | @ 12:00:00

## 2023-04-06 MED ADMIN — hydrALAZINE (APRESOLINE) injection 10 mg: 10 mg | INTRAVENOUS | @ 21:00:00

## 2023-04-06 MED ADMIN — bacitracin ointment: TOPICAL | @ 12:00:00 | Stop: 2023-04-12

## 2023-04-06 MED ADMIN — multivitamins, therapeutic with minerals tablet 1 tablet: 1 | ORAL | @ 12:00:00

## 2023-04-06 MED ADMIN — dexAMETHasone (DECADRON) tablet 6 mg: 6 mg | ORAL | @ 10:00:00

## 2023-04-06 MED ADMIN — captopril (CAPOTEN) tablet 6.25 mg: 6.25 mg | ORAL | @ 22:00:00 | Stop: 2023-04-06

## 2023-04-06 MED ADMIN — DULoxetine (CYMBALTA) DR capsule 30 mg: 30 mg | ORAL | @ 12:00:00

## 2023-04-06 MED ADMIN — dexAMETHasone (DECADRON) tablet 6 mg: 6 mg | ORAL | @ 16:00:00

## 2023-04-06 NOTE — Unmapped (Signed)
 Speech Language Pathology Cognitive Linguistic Evaluation  Evaluation (04/06/23 1322)      Patient Name:  Kenneth Fitzgerald [facility]       Medical Record Number: [id number]   Date of Birth: November 14, 1975  Sex: Male            SLP Treatment Diagnosis:       Activity Tolerance: Patient tolerated treatment well    Assessment   Patient seen for a cognitive-linguistic evaluation utilizing formal and informal measures. Patient encountered awake, alert, well-oriented, following commands, and with fluent/appropriate language. Vocal quality was [facility], and speech was intelligible. Slightly flat affect noted. Patient reported that prior to his surgery, he was noticing increased forgetfulness, for example stated he left the door unlocked, left the water running, etc. Reports he does not feel any change in his cognitive-linguistic status following his surgery. Patient presented with a score within the average range in all subtests of this evaluation, including orientation, attention, memory, comprehension, judgement, safety, calculations, and executive functioning. Given clinical presentation, acute ST services are not indicated. However, may consider more comprehensive SLP evaluation following discharge should patient go to rehab for more higher level executive functioning tasks given patient self report. SLP to sign off at this time. Please reconsult as needed. Thank you for involving [place] in this patient's care.    Recommended Compensatory Techniques: Hand over hand assist    Prognosis: Good  Positive Indicators: current clinical findings  Barriers to Discharge: Decreased caregiver support, Impaired Balance, Functional strength deficits, Gait instability, Inability to safely perform ADLS, Cognitive deficits     Plan of Care  SLP Daily Frequency: D/C Services, D/C Services    Treatment Goals:  Patient and Family Goal: presumed to go home        Post Acute Discharge Recommendations  Post Acute SLP Discharge Recommendations: Skilled SLP services indicated, 3x weekly (may consider eval for higher level executive functioning/cognitive tasks)    Subjective    Prior Function: Independent prior to admission, Active driver prior to admission  Lives With: Alone  Other Prior Function Comments: works as a Ship broker at [employer], right handed, does not wear glasses or have trouble with hearing  Educational Status: Radiographer, therapeutic)     Medical Updates Since Last Visit/Relevant PMH Affecting Clinical Decision Making: Initial consult. Per chart: [name] is a 48 y.o. 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      Patient is now admitted to the Neuroscience ICU for post operative care after RIGHT sided craniotomy for tumor resection, hematoma evacuation, and brachytherapy (Liebenger plates and gamma tiles) on 04/04/2023.  Communication Preference: Verbal  Patient/Caregiver Reports: doing well, agreeable to session  Pain: no s/s of pain       Allergies: Compazine [prochlorperazine] and Coconut  Current Facility-Administered Medications   Medication Dose Route Frequency Provider Last Rate Last Admin    ascorbic acid (vitamin C) (VITAMIN C) tablet 500 mg  500 mg Oral BID Cherre Robins, MD   500 mg at 04/06/23 8119    bacitracin ointment   Topical BID Drema Dallas, Pieter Partridge, ACNP   Given at 04/06/23 1478    bisacodyl (DULCOLAX) suppository 10 mg  10 mg Rectal Daily PRN Cherre Robins, MD        captopril (CAPOTEN) tablet 12.5 mg  12.5 mg Oral BID Drema Dallas, [place],  ACNP   12.5 mg at 04/06/23 0916    dexAMETHasone (DECADRON) tablet 6 mg  6 mg Oral Q6H SCH Drema Dallas, Pieter Partridge, ACNP   6 mg at 04/06/23 1143    DULoxetine (CYMBALTA) DR capsule 30 mg  30 mg Oral BID Shelda Altes, MD   30 mg at 04/06/23 0815    enoxaparin (LOVENOX) syringe 40 mg  40 mg Subcutaneous QPM Drema Dallas, Miriam, ACNP        ezetimibe (ZETIA) tablet 10 mg  10 mg Oral Daily Shelda Altes, MD   10 mg at 04/06/23 0808    flu vaccine TS 2024-25(48mos up)(PF)(FLULAVAL, FLUARIX, FLUZONE)  0.5 mL Intramuscular During hospitalization Drema Dallas, Miriam, ACNP        folic acid (FOLVITE) tablet 1,000 mcg  1,000 mcg Oral Daily Shelda Altes, MD   1,000 mcg at 04/06/23 5284    glucagon injection 1 mg  1 mg Intramuscular Once PRN Cherre Robins, MD        glucose chewable tablet 16 g  16 g Oral Q10 Min PRN Cherre Robins, MD        hydrALAZINE (APRESOLINE) injection 10 mg  10 mg Intravenous Q4H PRN Drema Dallas, Pieter Partridge, ACNP   10 mg at 04/06/23 1324    insulin lispro (HumaLOG) injection 0-20 Units  0-20 Units Subcutaneous ACHS Cherre Robins, MD   3 Units at 04/06/23 1143    labetalol (NORMODYNE) injection  10 mg Intravenous Q4H PRN Drema Dallas, Pieter Partridge, ACNP   10 mg at 04/06/23 0858    levETIRAcetam (KEPPRA) tablet 500 mg  500 mg Oral BID Cherre Robins, MD   500 mg at 04/06/23 4010    levothyroxine (SYNTHROID) tablet 375 mcg  375 mcg Oral Daily Drema Dallas, [place], ACNP   375 mcg at 04/06/23 2725    loratadine (CLARITIN) tablet 10 mg  10 mg Oral Daily Shelda Altes, MD   10 mg at 04/06/23 3664    morphine injection 2 mg  2 mg Intravenous Q2H PRN Drema Dallas, Pieter Partridge, ACNP   2 mg at 04/06/23 0449    multivitamins, therapeutic with minerals tablet 1 tablet  1 tablet Oral Daily Cherre Robins, MD   1 tablet at 04/06/23 4034    oxyCODONE (ROXICODONE) immediate release tablet 10 mg  10 mg Oral Q4H PRN Drema Dallas, Pieter Partridge, ACNP   10 mg at 04/06/23 0645    oxyCODONE (ROXICODONE) immediate release tablet 5 mg  5 mg Oral Q4H PRN Drema Dallas, Pieter Partridge, ACNP   5 mg at 04/06/23 0047    polyethylene glycol (MIRALAX) packet 17 g  17 g Oral TID Drema Dallas, Pieter Partridge, ACNP   17 g at 04/06/23 1143    senna (SENOKOT) tablet 2 tablet  2 tablet Oral BID Michel Santee, MD   2 tablet at 04/06/23 0807    SMOG ENEMA  240 mL Rectal Daily PRN Cherre Robins, MD        sulfaSALAzine (AZULFIDINE) tablet 1,000 mg  1,000 mg Oral BID Shelda Altes, MD   1,000 mg at 04/06/23 7425    zinc sulfate (ZINCATE) capsule 220 mg  220 mg Oral Daily Cherre Robins, MD   220 mg at 04/06/23 9563      Patient Active Problem List    Diagnosis Date Noted    Immunization declined 09/02/2022    Anxiety associated with cancer diagnosis (CMS-HCC) 03/04/2022    Recurrent major depressive disorder in  partial remission (CMS-HCC) 03/04/2022    Suicidal ideation 11/08/2021    Atherosclerotic cardiovascular disease 11/08/2021    HLD (hyperlipidemia) 11/08/2021    Iron deficiency anemia 11/06/2021    01/24/2020: Open, primary ventral hernia repair for SBO 01/25/2020    Morbid obesity with BMI of 40.0-44.9, adult (CMS-HCC) 01/25/2020    Metastasis to brain (CMS-HCC) 12/22/2019    Crohn's disease of large intestine without complication (CMS-HCC) 09/30/2019    Insomnia 08/26/2019    Ventral hernia with bowel obstruction 06/24/2016    Major depressive disorder in remission (CMS-HCC) 04/13/2015    Depressive disorder 04/13/2015    Thyroid cancer (CMS-HCC) 11/18/2012    Mild episode of recurrent major depressive disorder (CMS-HCC) 05/26/2012    Postoperative hypothyroidism 05/26/2012    Hypocalcemia 05/26/2012    Malignant neoplasm of thyroid gland (CMS-HCC) 07/04/2011    Nontoxic uninodular goiter 06/06/2011    Primary melanoma of head and neck (CMS-HCC) 10/10/2010    Diverticulosis of colon 06/27/2010    Diverticulitis of colon (without mention of hemorrhage)(562.11) 03/02/2010    Lumbosacral spondylosis without myelopathy 01/23/2010    Gout 01/18/2010     Past Medical History:   Diagnosis Date    Ankylosing spondylitis (CMS-HCC)     Atherosclerotic cardiovascular disease 11/08/2021    Cancer (CMS-HCC) 2012    melanoma, thyroid cancer    Cognitive impairment     since brain radiation he has noted some Crohn's disease (CMS-HCC)     Depressive disorder 04/13/2015    Disease of thyroid gland     hypothyroid    Diverticulitis of colon 2011    Gout     Hearing impairment     hearing loss in both ears    HL (hearing loss)     HLD (hyperlipidemia) 11/08/2021    Hypothyroidism     Iron deficiency anemia 11/06/2021    Metastatic cancer (CMS-HCC)     Skin cancer     Thyroid nodule 2013    Thyroid Cancer with recurrence.  Thyroidectomy       Past Surgical History:   Procedure Laterality Date    COLON SURGERY      Partial Colectomy    COLONOSCOPY      COSMETIC SURGERY      Related to excision of Cancer.    PR COLONOSCOPY FLX DX W/COLLJ SPEC WHEN PFRMD N/A 08/29/2021    Procedure: COLONOSCOPY, FLEXIBLE, PROXIMAL TO SPLENIC FLEXURE; DIAGNOSTIC, W/WO COLLECTION SPECIMEN BY BRUSH OR WASH;  Surgeon: Luanne Bras, MD;  Location: [facility] PROCEDURES [facility];  Service: Gastroenterology    PR COLONOSCOPY W/BIOPSY SINGLE/MULTIPLE N/A 01/09/2018    Procedure: COLONOSCOPY, FLEXIBLE, PROXIMAL TO SPLENIC FLEXURE; WITH BIOPSY, SINGLE OR MULTIPLE;  Surgeon: Vonda Antigua, MD;  [facility];  Service: Gastroenterology    PR COLONOSCOPY W/BIOPSY SINGLE/MULTIPLE N/A 08/24/2019    Procedure: COLONOSCOPY, FLEXIBLE, PROXIMAL TO SPLENIC FLEXURE; WITH BIOPSY, SINGLE OR MULTIPLE;  Surgeon: Leland Her, MD;  Location: GI PROCEDURES [place] [facility];  Service: Gastroenterology    PR COLONOSCOPY W/BIOPSY SINGLE/MULTIPLE N/A 08/29/2021    Procedure: COLONOSCOPY, FLEXIBLE, PROXIMAL TO SPLENIC FLEXURE; WITH BIOPSY, SINGLE OR MULTIPLE;  Surgeon: Luanne Bras, MD;  Location: [facility] PROCEDURES [facility];  Service: Gastroenterology    PR COLSC FLX WITH DIRECTED SUBMUCOSAL NJX ANY SBST N/A 08/24/2019    Procedure: COLONOSCOPY, FLEXIBLE, PROXIMAL TO SPLENIC FLEXURE; WITH DIRECTED SUBMUCOSAL INJECTION(S), ANY SUBSTANCE;  Surgeon: Leland Her, MD;  Location: GI PROCEDURES [place]  [facility];  Service: Gastroenterology    PR EXPLORATORY OF ABDOMEN N/A 01/24/2020    Procedure: EXPLORATORY LAPAROTOMY, EXPLORATORY CELIOTOMY WITH OR WITHOUT BIOPSY(S);  Surgeon: [name], MD;  Location: MAIN OR [facility];  Service: Trauma    PR FREEING BOWEL ADHESION,ENTEROLYSIS N/A 01/24/2020    Procedure: ENTEROLYSIS (SEPART PROC);  Surgeon: [name], MD;  Location: MAIN OR [facility];  Service: Trauma    PR IMPLANT MESH HERNIA REPAIR/DEBRIDEMENT CLOSURE N/A 01/03/2015    Procedure: IMPLANTATION OF MESH/OTHER PROSTHES INCISION/VENTRAL HERNIA REPAIR/MESH CLOSE DEBRID NECROT SOFT TIS INFECT;  Surgeon: Romero Belling, MD;  Location: MAIN OR [place];  Service: Gastrointestinal    PR LAP, VENTRAL HERNIA REPAIR,REDUCIBLE N/A 04/07/2014    Procedure: LAPAROSCOPY, SURGICAL, REPAIR, VENTRAL, UMBILICAL, SPIGELIAN OR EPIGASTRIC HERNIA, REDUCIBLE;  Surgeon: Romero Belling, MD;  Location: MAIN OR [place];  Service: Gastrointestinal    PR NEGATIVE PRESSURE WOUND THERAPY DME >50 SQ CM N/A 01/24/2020    Procedure: NEG PRESS WOUND TX (VAC ASSIST) INCL TOPICALS, PER SESSION, TSA GREATER THAN/= 50 CM SQUARED;  Surgeon: [name], MD;  Location: MAIN OR [place];  Service: Trauma    PR REMOVAL NODES, NECK,CERV CMPLT Left 12/07/2012    Procedure: CERVICAL LYMPHADENECTOMY (COMPLETE);  Surgeon: Charlott Rakes, MD;  Location: MAIN OR [facility];  Service: Surgical Oncology    PR REMOVAL NODES, NECK,CERV MOD RAD Right 03/29/2013    Procedure: CERVICAL LYMPHADENECTOMY (MODIFIED RADICAL NECK DISSECTION);  Surgeon: Charlott Rakes, MD;  Location: MAIN OR [facility];  Service: Surgical Oncology    PR REPAIR RECURR INCIS [facility] N/A 01/03/2015    Procedure: REPAIR RECURRENT INCISIONAL OR VENTRAL HERNIA; REDUCIBLE;  Surgeon: Romero Belling, MD;  Location: MAIN OR [facility];  Service: Gastrointestinal    PR REPAIR RECURR INCIS HERNIA,STRANG N/A 06/21/2016    Procedure: REPAIR RECURRENT INCISIONAL OR VENTRAL HERNIA; INCARCERATED OR STRANGULATED;  Surgeon: Mickle Asper, MD;  Location: MAIN OR [facility];  Service: Gastrointestinal    PR REPAIR RECURR INCIS HERNIA,STRANG N/A 01/24/2020    Procedure: REPAIR RECURRENT INCISIONAL OR VENTRAL HERNIA; INCARCERATED OR STRANGULATED;  Surgeon: [name], MD;  Location: MAIN OR [place];  Service: Trauma    PR SIGMOIDOSCOPY,FINE NEEDL BX,US GUIDED N/A 01/09/2018    Procedure: SIGMOIDOSCOPY, FLEXIBLE, W/TRANSENDOSCOPIC ULTRASOUND GUIDED NEEDLE ASPIRATION;  Surgeon: Vonda Antigua, MD;  Location: [facility] PROCEDURES [facility];  Service: Gastroenterology    THYROID SURGERY       Social History     Tobacco Use    Smoking status: Never     Passive exposure: Never    Smokeless tobacco: Never   Substance Use Topics    Alcohol use: Yes     Comment: social drinker 3 times yearly     Family History   Problem Relation Age of Onset    Hyperthyroidism Mother     Osteoporosis Mother     Arrhythmia Mother     Squamous cell carcinoma Mother         basal cell vs squamous cell skin cancer    Arthritis Mother     Cancer Mother     Coronary artery disease Father         s/p CABG    Diabetes Father     Hypertension Father     Prostate cancer Father     Angina Father     Cancer Father     Heart disease Father     Colon cancer Paternal Grandmother     Diabetes Paternal Grandmother  Diabetes Maternal Aunt     Diabetes Paternal Uncle     Diabetes Paternal Aunt     Thyroid disease Neg Hx      General:   Medical Tests / Procedures Comments: MRI 3/22: - Expected postsurgical changes of right frontal craniotomy for melanoma metastases resection. Mild residual intrinsic T1 hyperintense signal at the deep/inferior resection margin.  -Small focal 8 mm extra-axial hematoma overlying the resection cavity. Unchanged 4 mm leftward midline shift.  - Extensive intracranial metastases in a similar distribution to prior.    Precautions / Restrictions  Precautions: Falls precautions, HOB elevated  Weight Bearing Status: Non-applicable  Required Braces or Orthoses: Non-applicable  Precautions / Restrictions comments: HOB >30    Objective  Swallow Status: diet per team  Motor Speech: WNL       Orientation: Orientation: 12/12, WFL (Cognistat)  Attention: Attention is within functional limits  Attention Comments: Digit repetition: 8/8, WFL (Cognistat)  Memory: Short term memory is within functional limits  Memory Comments: Delayed word recall: 9/12, WFL (Cognistat)  Problem Solving: Problem solving is within functional limits  Problem Solving Comments: Calculations: 4/4, WFL (Cognistat), Abstract Reasoning: 8/8, WFL (Cognistat), Clock Drawing: 13/13, WFL (Cognitive Linguistic Quick Test)  Safety/Judgement: Safety/Judgement is within functional limits  Safety/Judgement Comments: Safety: 7/8, WFL (Cognitive Assessment of [place]), Judgement: 5/6, WFL (Cognistat)  Verbal Expression: Verbal Expression is within functional limits  Auditory Comprehension: Auditory Comprehension is within functional limits  Pragmatics/Prosody Comments: flat affect noted    Activity Tolerance: Patient tolerated treatment well    Patient at end of session: All needs in reach    Speech Therapy Session Duration   SLP Individual [mins]: 21    I attest that I have reviewed the above information.  Signed: Nonie Hoyer, SLP  Ceasar Mons 04/06/2023

## 2023-04-06 NOTE — Unmapped (Signed)
 Neurocritical Care Daily Progress Note     CODE STATUS:    Code Status: Full Code    HCDM (With Legal Document To Support): Huntley,Tammy - Sister - [phone]  Discussed by neurosurgery    Date of service: 04/06/2023  Hospital Day:  LOS: 2 days        HPI     Kenneth Fitzgerald is a 48 y.o. 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     Patient is now admitted to the Neuroscience ICU for post operative care after RIGHT sided craniotomy for tumor resection, hematoma evacuation, and brachytherapy (Liebenger plates and gamma tiles) on 04/04/2023.       Assessment and Plan     Kenneth Fitzgerald is a 48 y.o. male admitted to NSICU with a diagnosis of metastatic melanoma s/p resection and brachytherapy.     Interval Events:   - NAEON    Neuro:  Mobility goal: 6 (Ambulate in hallway)    **Brain tumor, right frontal s/p resection and brachytherapy 04/04/2023  - with brain compression and cerebral edema  - CT brain 03/26/23 (personally reviewed) shows redemonstration of the metastatic melanoma with associated vasogenic edema, and 7mm right to left midline shift.  -MRI today  - Sodium goal greater than 135  - goal eunatremia, euglycemia  - dexamethasone at 6 mg every 6h, with taper over 2 weeks per SRN   - levetiracetam 500mg  [id number]  - follow up path, frozen consistent with metastatic melanoma   - PT/OT    **Pain  - tylenol prn mild pain  - oxycodone prn moderate pain  - fentanyl prn severe pain  - duloxetine 30 mg BID     Cardiovascular:  Admission weight: 140.3Kg  Daily Weight: (!) 132.7 kg (292 lb 8.8 oz)     Hemodynamic goals:  - MAP > 65  - SBP < 160    **Hypertension  Patient does not have a history of hypertension at baseline.  He reports that he does have whitecoat syndrome.  Patient required multiple PRNs to maintain the blood pressure at less than 140.  Unable to reach postoperative goal with PRNs. Not on home blood pressure medications   -off nicardipine  -Troponin 5, EKG reassuring   -Manage pain accordingly  - captopril 12.5 mg BID given multiple PRNs used      Pulmonary    - on RA     FEN  Body mass index is 37.56 kg/m??.    GI prophylaxis: not indicated  Last BM Date:  (pta)  Foley catheter: No foley in place.      **Nutrition  - start PO diet  - vit C, folate, zinc, multivitamin supplementation    **Crohn's Disease  - continue sulfasalazine     **Glycemic control  - FSBS and SSI to prevent hyperglycemia and to assess insulin needs  - Hgb A1c 05/10/20 5.4    **Fluids/electrolytes  - goal euvolemia   - follow strict ins/outs  - check admission chemistry panel and supplement electrolytes as needed    Heme/ID  Current DVT prophylaxis: SCDs, Lovenox 40 mg daily to start evening 3/23     **Metastatic melanoma  Please see HPI for prior treatment history  -Brain resection frozen is consistent with metastatic melanoma  -Further management and treatment per neurosurgery and oncology  - rad onc consult completed   -  Not currently neutropenic   - on encorafenib and binimetinib    - leukocytosis likely in setting of steroids + OR, no infectious concerns     Current Access:         -  PIV x 2       - Arterial line: Arterial line to be removed today.       - Central venous line: No central line present.    Tubes and drains:  Patient Lines/Drains/Airways Status       Active Active Lines, Drains, & Airways       Name Placement date Placement time Site Days    Closed/Suction Drain 1 LLQ Bulb 06/21/16  1903  LLQ  2479    Closed/Suction Drain 1 LUQ Bulb 15 Fr. 01/24/20  1622  LUQ  1167    Peripheral IV 04/04/23 Left Antecubital 04/04/23  2122  Antecubital  1                           Objective Data     All vital signs and resulted laboratory studies for the past 24 hours have been reviewed.  All ordered medications have been reviewed.    Temp:  [36.7 ??C (98 ??F)-36.9 ??C (98.4 ??F)] 36.9 ??C (98.4 ??F)  Heart Rate:  [65-105] 70  SpO2 Pulse:  [70-98] 70  Resp:  [18-30] 27  BP: (144-165)/(69-87) 151/77  MAP (mmHg):  [93-112] 107  A BP-2: (116-130)/(69-71) 116/71  MAP:  [86 mmHg-88 mmHg] 86 mmHg  SpO2:  [95 %-100 %] 99 %    Intake/Output Summary (Last 24 hours) at 04/06/2023 0849  Last data filed at 04/06/2023 0615  Gross per 24 hour   Intake 700 ml   Output 600 ml   Net 100 ml        Physical Exam          General Exam:  General: Awake and alert.   Appears comfortable. No obvious distress.   ENT:  Mucous membranes moist. Oropharynx clear.  Cardiovascular:  Regular rate and rhythm.  No murmurs.   2+ radial pulses bilaterally.    Respiratory: Breathing is comfortable and unlabored.  Lungs are clear to ausculation bilaterally.    Gastrointestinal: Soft, nontender, nondistended.  Extremities: Warm and well-perfused. No cyanosis, clubbing, or edema.  Skin: No obvious rashes or ecchymoses.    Neurological Exam:  Mental Status  LOC: awake and alert  Orientation: person, place and time  Neglect: not present  Speech: normal  Language: fluent with intact naming, repetition, comprehension    Cranial Nerves  Pupils: PERRL  Corneals: present bilaterally  Gaze: normal  Face Motor: normal and symmetric  Cough: strong  Tongue: midline    Motor:   RUE: commands and 5/5  LUE: commands and 5/5  RLE: commands and 5/5  LLE: commands and 5/5   L Hand grip 4/5, stable from prior     Sensory:    Sensory is intact to light touch    Glasgow Coma Score  Motor: obeys commands = 6  Verbal: oriented = 5  Eyes: eyes open spontaneously = 4        DISPOSITION      The patient is stable to transfer to an acute care bed.    Estimated Transfer Date:  3/22        BILLING     This is a subsequent evaluation of the patient.      Percell Miller  Lisette Grinder, MD  Neurocritical Care Attending

## 2023-04-06 NOTE — Unmapped (Signed)
 BRIEF TREATMENT PLAN NOTE    48 yo M with Stage IV BRAF+ melanoma with mets to abdominal/pelvic LN (s/p abrafeinib/trametinib mid-May 2020) and more recently on Braftovi and [name], with mektovi being held due to risk of hemorrhage, who is now s/p brain resection and brachytherapy with  Cs-131 GammaTiles. Onc paged to restart [name] and Braftovi.    MRI brain with smal focal hematoma over the resection cavity. Additionally, patient does not currently have meds with them.     Plan:  - Primary team to discuss with surgery risk of bleed within a week of surgery  - Family to bring meds in    Please page Oncology when medications are in hand so appropriate IP treament plan can be made.    Stark Bray MD, PhD PGY3  Hematology/Oncology Fellow

## 2023-04-06 NOTE — Unmapped (Signed)
 Neuro: Pt alert and oriented x4.  Pupils equal and reactive.  + c/g/c.  FCx4. Continuing concerns for poor safety awareness.  CV: NSR on the monitor. SBP goal <160. Hypertensive, multiple prns given. Provider notified. Captopril started.  Resp:  SpO2>92%.  Lungs clear throughout.  GI:  +BS x4.  BM today. Taking PO  GU/Renal: Voiding.  Endo: achs Accuchecks.   ID: afebrile    Family updated on POC.   Problem: Wound  Goal: Optimal Coping  Outcome: Ongoing - Unchanged  Goal: Optimal Functional Ability  Outcome: Ongoing - Unchanged  Intervention: Optimize Functional Ability  Recent Flowsheet Documentation  Taken 04/06/2023 1600 by Carolynn Sayers, RN  Activity Management: ambulated in room  Taken 04/06/2023 1000 by Carolynn Sayers, RN  Activity Management: ambulated in room  Goal: Absence of Infection Signs and Symptoms  Outcome: Ongoing - Unchanged  Intervention: Prevent or Manage Infection  Recent Flowsheet Documentation  Taken 04/06/2023 0800 by Carolynn Sayers, RN  Infection Management: aseptic technique maintained  Goal: Improved Oral Intake  Outcome: Ongoing - Unchanged  Goal: Optimal Pain Control and Function  Outcome: Ongoing - Unchanged  Goal: Skin Health and Integrity  Outcome: Ongoing - Unchanged  Intervention: Optimize Skin Protection  Recent Flowsheet Documentation  Taken 04/06/2023 1600 by Carolynn Sayers, RN  Activity Management: ambulated in room  Head of Bed [facility]) Positioning: HOB at 30-45 degrees  Taken 04/06/2023 1200 by Carolynn Sayers, RN  Head of Bed [facility]) Positioning: HOB at 30-45 degrees  Taken 04/06/2023 1000 by Carolynn Sayers, RN  Activity Management: ambulated in room  Taken 04/06/2023 0800 by [place], RN  Pressure Reduction Techniques: frequent weight shift encouraged  Head of Bed (HOB) Positioning: HOB at 30-45 degrees  Pressure Reduction Devices: pressure-redistributing mattress utilized  Skin Protection: adhesive use limited  Goal: Optimal Wound Healing  Outcome: Ongoing - Unchanged     Problem: Adult Inpatient Plan of Care  Goal: Plan of Care Review  Outcome: Ongoing - Unchanged  Goal: Patient-Specific Goal (Individualized)  Outcome: Ongoing - Unchanged  Goal: Absence of Hospital-Acquired Illness or Injury  Outcome: Ongoing - Unchanged  Intervention: Identify and Manage Fall Risk  Recent Flowsheet Documentation  Taken 04/06/2023 0800 by Carolynn Sayers, RN  Safety Interventions:   aspiration precautions   bleeding precautions   bed alarm   low bed   fall reduction program maintained   environmental modification  Intervention: Prevent Skin Injury  Recent Flowsheet Documentation  Taken 04/06/2023 0800 by Carolynn Sayers, RN  Skin Protection: adhesive use limited  Intervention: Prevent and Manage VTE (Venous Thromboembolism) Risk  Recent Flowsheet Documentation  Taken 04/06/2023 1600 by Carolynn Sayers, RN  Anti-Embolism Device Type: SCD, Knee  Anti-Embolism Device Status: On  Anti-Embolism Device Location: BLE  Taken 04/06/2023 1400 by [place], RN  Anti-Embolism Device Type: SCD, Knee  Anti-Embolism Device Status: On  Anti-Embolism Device Location: BLE  Taken 04/06/2023 1200 by [place], RN  Anti-Embolism Device Type: SCD, Knee  Anti-Embolism Device Status: On  Anti-Embolism Device Location: BLE  Taken 04/06/2023 1000 by [place], RN  Anti-Embolism Device Type: SCD, Knee  Anti-Embolism Device Status: On  Anti-Embolism Device Location: BLE  Taken 04/06/2023 0800 by Carolynn Sayers, RN  Anti-Embolism Device Type: SCD, Knee  Anti-Embolism Device Status: On  Anti-Embolism Device Location: BLE  Intervention: Prevent Infection  Recent Flowsheet Documentation  Taken 04/06/2023 0800 by Carolynn Sayers, RN  Infection  Prevention:   cohorting utilized   environmental surveillance performed   equipment surfaces disinfected   hand hygiene promoted  Goal: Optimal Comfort and Wellbeing  Outcome: Ongoing - Unchanged  Goal: Readiness for Transition of Care  Outcome: Ongoing - Unchanged  Goal: Rounds/Family Conference  Outcome: Ongoing - Unchanged     Problem: Fall Injury Risk  Goal: Absence of Fall and Fall-Related Injury  Outcome: Ongoing - Unchanged  Intervention: Promote Scientist, clinical (histocompatibility and immunogenetics) Documentation  Taken 04/06/2023 0800 by Carolynn Sayers, RN  Safety Interventions:   aspiration precautions   bleeding precautions   bed alarm   low bed   fall reduction program maintained   environmental modification     Problem: Skin Injury Risk Increased  Goal: Skin Health and Integrity  Outcome: Ongoing - Unchanged  Intervention: Optimize Skin Protection  Recent Flowsheet Documentation  Taken 04/06/2023 1600 by Carolynn Sayers, RN  Activity Management: ambulated in room  Head of Bed [facility]) Positioning: HOB at 30-45 degrees  Taken 04/06/2023 1200 by Carolynn Sayers, RN  Head of Bed [facility]) Positioning: HOB at 30-45 degrees  Taken 04/06/2023 1000 by Carolynn Sayers, RN  Activity Management: ambulated in room  Taken 04/06/2023 0800 by [place], RN  Pressure Reduction Techniques: frequent weight shift encouraged  Head of Bed (HOB) Positioning: HOB at 30-45 degrees  Pressure Reduction Devices: pressure-redistributing mattress utilized  Skin Protection: adhesive use limited     Problem: Self-Care Deficit  Goal: Improved Ability to Complete Activities of Daily Living  Outcome: Ongoing - Unchanged

## 2023-04-06 NOTE — Unmapped (Signed)
 [facility]   INPATIENT PROGRESS NOTE      Brief History of Present Illness  Kenneth Fitzgerald is a 48 y.o. male with metastatic melanoma who is now sp R craniotomy for resection (3/21)    Subjective/Interval History  NAEON, having some HA but overall improving. Was OOB yesterday. No complaints this morning.     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

## 2023-04-07 LAB — HEMOGLOBIN A1C
ESTIMATED AVERAGE GLUCOSE: 120 mg/dL
HEMOGLOBIN A1C: 5.8 % — ABNORMAL HIGH (ref 4.8–5.6)

## 2023-04-07 MED ADMIN — sulfaSALAzine (AZULFIDINE) tablet 1,000 mg: 1000 mg | ORAL | @ 13:00:00

## 2023-04-07 MED ADMIN — senna (SENOKOT) tablet 2 tablet: 2 | ORAL | @ 13:00:00

## 2023-04-07 MED ADMIN — bacitracin ointment: TOPICAL | @ 01:00:00 | Stop: 2023-04-12

## 2023-04-07 MED ADMIN — multivitamins, therapeutic with minerals tablet 1 tablet: 1 | ORAL | @ 13:00:00

## 2023-04-07 MED ADMIN — zinc sulfate (ZINCATE) capsule 220 mg: 220 mg | ORAL | @ 13:00:00

## 2023-04-07 MED ADMIN — levothyroxine (SYNTHROID) tablet 375 mcg: 375 ug | ORAL | @ 10:00:00

## 2023-04-07 MED ADMIN — folic acid (FOLVITE) tablet 1,000 mcg: 1000 ug | ORAL | @ 13:00:00

## 2023-04-07 MED ADMIN — enoxaparin (LOVENOX) syringe 40 mg: 40 mg | SUBCUTANEOUS | @ 23:00:00

## 2023-04-07 MED ADMIN — lisinopril (PRINIVIL,ZESTRIL) tablet 10 mg: 10 mg | ORAL | @ 13:00:00

## 2023-04-07 MED ADMIN — dexAMETHasone (DECADRON) tablet 6 mg: 6 mg | ORAL | @ 05:00:00 | Stop: 2023-04-07

## 2023-04-07 MED ADMIN — loratadine (CLARITIN) tablet 10 mg: 10 mg | ORAL | @ 13:00:00

## 2023-04-07 MED ADMIN — insulin lispro (HumaLOG) injection 0-20 Units: 0-20 [IU] | SUBCUTANEOUS | @ 11:00:00

## 2023-04-07 MED ADMIN — oxyCODONE (ROXICODONE) immediate release tablet 5 mg: 5 mg | ORAL | @ 21:00:00 | Stop: 2023-04-18

## 2023-04-07 MED ADMIN — dexAMETHasone (DECADRON) tablet 6 mg: 6 mg | ORAL | @ 10:00:00 | Stop: 2023-04-07

## 2023-04-07 MED ADMIN — dexAMETHasone (DECADRON) tablet 4 mg: 4 mg | ORAL | @ 23:00:00 | Stop: 2023-04-09

## 2023-04-07 MED ADMIN — levETIRAcetam (KEPPRA) tablet 500 mg: 500 mg | ORAL | @ 01:00:00 | Stop: 2023-04-18

## 2023-04-07 MED ADMIN — insulin lispro (HumaLOG) injection 0-20 Units: 0-20 [IU] | SUBCUTANEOUS | @ 16:00:00

## 2023-04-07 MED ADMIN — ascorbic acid (vitamin C) (VITAMIN C) tablet 500 mg: 500 mg | ORAL | @ 01:00:00 | Stop: 2023-04-19

## 2023-04-07 MED ADMIN — insulin lispro (HumaLOG) injection 0-20 Units: 0-20 [IU] | SUBCUTANEOUS | @ 01:00:00

## 2023-04-07 MED ADMIN — DULoxetine (CYMBALTA) DR capsule 30 mg: 30 mg | ORAL | @ 01:00:00

## 2023-04-07 MED ADMIN — DULoxetine (CYMBALTA) DR capsule 30 mg: 30 mg | ORAL | @ 13:00:00

## 2023-04-07 MED ADMIN — ezetimibe (ZETIA) tablet 10 mg: 10 mg | ORAL | @ 13:00:00

## 2023-04-07 MED ADMIN — acetaminophen (TYLENOL) tablet 650 mg: 650 mg | ORAL | @ 08:00:00

## 2023-04-07 MED ADMIN — senna (SENOKOT) tablet 2 tablet: 2 | ORAL | @ 01:00:00

## 2023-04-07 MED ADMIN — levETIRAcetam (KEPPRA) tablet 500 mg: 500 mg | ORAL | @ 13:00:00 | Stop: 2023-04-18

## 2023-04-07 MED ADMIN — sodium chloride (OCEAN) 0.65 % nasal spray 1 spray: 1 | NASAL | @ 10:00:00

## 2023-04-07 MED ADMIN — hydrALAZINE (APRESOLINE) injection 10 mg: 10 mg | INTRAVENOUS | @ 05:00:00

## 2023-04-07 MED ADMIN — dexAMETHasone (DECADRON) tablet 6 mg: 6 mg | ORAL | @ 16:00:00 | Stop: 2023-04-07

## 2023-04-07 MED ADMIN — ascorbic acid (vitamin C) (VITAMIN C) tablet 500 mg: 500 mg | ORAL | @ 13:00:00 | Stop: 2023-04-19

## 2023-04-07 MED ADMIN — insulin lispro (HumaLOG) injection 0-20 Units: 0-20 [IU] | SUBCUTANEOUS | @ 20:00:00

## 2023-04-07 MED ADMIN — captopril (CAPOTEN) tablet 12.5 mg: 12.5 mg | ORAL | @ 01:00:00

## 2023-04-07 NOTE — Unmapped (Signed)
 [facility]   INPATIENT PROGRESS NOTE      Brief History of Present Illness  Kenneth Fitzgerald is a 48 y.o. male with metastatic melanoma who is now sp R craniotomy for resection (3/21)    Subjective/Interval History  Floor status. 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)  POD3 R craniotomy  - Na>135  - SBP<160  - dexamethasone taper over 2 weeks   - post op MRI and CT completed  - gamma tiles trial  - keppra 500mg  bidx14d  - follow up path, frozen consistent with metastatic melanoma   - oncology following for initiation of braf/mek inhibitors  - PT/OT  - ok to gently wash head    Anticoagulant/Antiplatelet needs: None    Disposition: floor, stable for dispo    ___________________________________________________________________    Neurological Exam  EOSp  AOX3  PERRL  EOMI  FS  TML  RUE/RLE 5  LUE/LLE 5  SILT  No drift  Incision cdi staples      The patient's vitals, intake/output, labs, orders, and relevant imaging were reviewed for the last 24 hours.    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

## 2023-04-07 NOTE — Unmapped (Signed)
 Shift Summary:  Neuro monitoring continued  Awaiting bed on floor    Neuro: Q4 (see chart)  Cardiac: SBP 90-160 (65)  Resp: RA  GI: Reg Diet  GU: Voiding  IV: 1xPIV  Drips: None  Skin: Surgical site head    Bed alarm on. Fall precautions in place. Alarms on and audible.   Problem: Wound  Goal: Optimal Functional Ability  Intervention: Optimize Functional Ability  Recent Flowsheet Documentation  Taken 04/06/2023 2000 by Lacy Duverney, RN  Activity Management: ambulated in room  Goal: Absence of Infection Signs and Symptoms  Intervention: Prevent or Manage Infection  Recent Flowsheet Documentation  Taken 04/06/2023 2000 by Lacy Duverney, RN  Infection Management: aseptic technique maintained  Goal: Skin Health and Integrity  Intervention: Optimize Skin Protection  Recent Flowsheet Documentation  Taken 04/07/2023 0400 by Lacy Duverney, RN  Head of Bed [facility]) Positioning: HOB at 30-45 degrees  Taken 04/06/2023 2000 by Lacy Duverney, RN  Activity Management: ambulated in room  Pressure Reduction Techniques: frequent weight shift encouraged  Head of Bed (HOB) Positioning: HOB at 30-45 degrees  Pressure Reduction Devices: pressure-redistributing mattress utilized  Skin Protection: adhesive use limited     Problem: Adult Inpatient Plan of Care  Goal: Absence of Hospital-Acquired Illness or Injury  Intervention: Identify and Manage Fall Risk  Recent Flowsheet Documentation  Taken 04/06/2023 2000 by Lacy Duverney, RN  Safety Interventions:   aspiration precautions   bed alarm   low bed  Intervention: Prevent Skin Injury  Recent Flowsheet Documentation  Taken 04/07/2023 0400 by Lacy Duverney, RN  Positioning for Skin: Right  Taken 04/07/2023 0200 by Lacy Duverney, RN  Positioning for Skin: Left  Taken 04/07/2023 0000 by Lacy Duverney, RN  Positioning for Skin: Right  Taken 04/06/2023 2200 by Lacy Duverney, RN  Positioning for Skin: Left  Taken 04/06/2023 2000 by Lacy Duverney, RN  Positioning for Skin: Right  Device Skin Pressure Protection:   absorbent pad utilized/changed   tubing/devices free from skin contact  Skin Protection: adhesive use limited  Intervention: Prevent and Manage VTE (Venous Thromboembolism) Risk  Recent Flowsheet Documentation  Taken 04/07/2023 0400 by Lacy Duverney, RN  Anti-Embolism Device Type: SCD, Knee  Anti-Embolism Device Status: On  Anti-Embolism Device Location: BLE  Taken 04/07/2023 0200 by Lacy Duverney, RN  Anti-Embolism Device Type: SCD, Knee  Anti-Embolism Device Status: On  Anti-Embolism Device Location: BLE  Taken 04/07/2023 0000 by Lacy Duverney, RN  Anti-Embolism Device Type: SCD, Knee  Anti-Embolism Device Status: On  Anti-Embolism Device Location: BLE  Taken 04/06/2023 2200 by Lacy Duverney, RN  Anti-Embolism Device Type: SCD, Knee  Anti-Embolism Device Status: On  Anti-Embolism Device Location: BLE  Taken 04/06/2023 2000 by Lacy Duverney, RN  Anti-Embolism Device Type: SCD, Knee  Anti-Embolism Device Status: On  Anti-Embolism Device Location: BLE  Intervention: Prevent Infection  Recent Flowsheet Documentation  Taken 04/06/2023 2000 by Lacy Duverney, RN  Infection Prevention:   environmental surveillance performed   equipment surfaces disinfected   hand hygiene promoted   personal protective equipment utilized   rest/sleep promoted   single patient room provided     Problem: Fall Injury Risk  Goal: Absence of Fall and Fall-Related Injury  Intervention: Promote Injury-Free Environment  Recent Flowsheet Documentation  Taken 04/06/2023 2000 by Lacy Duverney, RN  Safety Interventions:   aspiration precautions   bed alarm   low bed  Problem: Skin Injury Risk Increased  Goal: Skin Health and Integrity  Intervention: Optimize Skin Protection  Recent Flowsheet Documentation  Taken 04/07/2023 0400 by Lacy Duverney, RN  Head of Bed [facility]) Positioning: HOB at 30-45 degrees  Taken 04/06/2023 2000 by Lacy Duverney, RN  Activity Management: ambulated in room  Pressure Reduction Techniques: frequent weight shift encouraged  Head of Bed (HOB) Positioning: HOB at 30-45 degrees  Pressure Reduction Devices: pressure-redistributing mattress utilized  Skin Protection: adhesive use limited

## 2023-04-07 NOTE — Unmapped (Signed)
 Oncology Consult Note    Requesting Attending Physician :  Arman Filter, MD  Service Requesting Consult : Neurosurgery [facility])  Reason for Consult: metastatic melanoma  Primary Oncologist: Dr. Lendell Caprice, transitioning to Dr. Tonia Ghent Moschos    Assessment: Kenneth Fitzgerald is a 48 y.o. male with metastatic (M1d) melanoma who was admitted for planned neurosurgical intervention. Oncology was consulted for evaluation of appropriateness of restarting targeted therapy.     Kenneth Fitzgerald has a complicated oncologic history including two primaries, most recently metastatic melanoma. In the metastatic setting, he has received nivolumab x 2c which was complicated by G3 colitis. Additionally, he has been on encorafinib/binimetinib since May 2020, which has been held due to neurosurgical intervention and concern for bleeding (given already existing perilesional hemorrhage). He has also received multiple prior course of radiation to the brain, including whole brain to 30Gy in 10 fx completed 01/20/20 and 2 left frontal lesions and right cingulate gyrus lesion to 20 Gy in 1 fraction completed 05/07/22.    At this point, it would be appropriate to hold his targeted therapy for several reasons. First, he has recently had surgical resection with intraoperative cesium. Second, there may be perioperative risk for bleeding. Third, while pathology is pending, it would be worth waiting for results from the recent pathology from recent neurosurgical intervention. If this is deemed to be due to CNS progression on targeted therapy (encorafinib/binimetinib), it would be worth discussion with his outpatient melanoma specialist whether continuing with targeted therapy or whether switching to other systemic therapy (i.e. immunotherapy in a patient with history of ankylosing spondylitis and prior G3 ICI colitis) would be more appropriate.    Recommendations:   - Hold home encorafinib/binimetinib at this time.  - Follow up surgical pathology. We will coordinate any necessary NGS testing (BRAF confirmed only by IHC, previous NGS specimens were QNS).  - Dexamethasone per primary team.  - We will discuss with Dr. Lendell Caprice and Dr. Tonia Ghent Moschos.  - No systemic therapy is anticipate on inpatient basis.    This patient has been staffed with Dr. Barbie Haggis. These recommendations were discussed with the primary team.     Please contact the oncology consult fellow at [phone] with any further questions.    Cristy Hilts, MD/PhD  Oncology Fellow, PGY-6    TEACHING PHYSICIAN ATTESTATION    I was present with Dr. Cathie Hoops, fellow during the history and exam.  I discussed the findings, assessment and plan with him and agree with the findings and plan as documented in his note.    Barbie Haggis, MD  Attending Physician    -------------------------------------------------------------    HPI: Kenneth Fitzgerald is a 48 y.o. male and who is being seen at the request of Arman Filter, MD for evaluation of metastatic melanoma.     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 He was also found to have melanoma of the scalp (2012), initially stage IIIb, for which he received adjuvant HD-IFN (2014) and cervical neck dissection and XRT (2015) upon recurrence. This eventually progressed with abdominal/pelvic nodal metastases, for which he received nivolumab x 2C, c/b G3 colitis. Since May 2020, he has been on encorafinib/binimetinib. Additionally, after CNS progression (2020), he has received WBRT (completed 01/20/20) and SRS to 3 lesions (05/07/22).    Based on review of MRI 01/30/23, there was concern for hemorrhage of disease in R frontal lobe, possibly orriginating  from a previously treated lesion. He was admitted for planned neurosurgical resection with intraoperative brachytherapy with Cs-131 GammaTiles, which was performed on 04/04/23.    Review of Systems: All positive and pertinent negatives are noted in the HPI; a 10 system review of systems was otherwise negative except as noted in HPI.    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)   12/22/2019 Initial Diagnosis    Malignant neoplasm of brain, unspecified location (CMS-HCC)     12/22/2019 - 03/02/2022 Radiation    Radiation Therapy Treatment Details (12/22/2019 - 03/02/2022)  Site: Brain  Technique: IMRT  Goal: No goal specified  Planned Treatment Start Date: No planned start date specified     04/29/2022 -  Radiation    Radiation Therapy Treatment Details (Noted on 04/29/2022)  Site: Not Applicable Brain  Technique: SRS  Goal: No goal specified  Planned Treatment Start Date: No planned start date specified         Past Medical History:   Diagnosis Date    Ankylosing spondylitis (CMS-HCC)     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  Thyroidectomy       Past Surgical History:   Procedure Laterality Date    COLON SURGERY      Partial Colectomy    COLONOSCOPY      COSMETIC SURGERY      Related to excision of Cancer.    PR COLONOSCOPY FLX DX W/COLLJ SPEC WHEN PFRMD N/A 08/29/2021    Procedure: COLONOSCOPY, FLEXIBLE, PROXIMAL TO SPLENIC FLEXURE; DIAGNOSTIC, W/WO COLLECTION SPECIMEN BY BRUSH OR WASH;  Surgeon: Luanne Bras, MD;  Location: [facility];  Service: Gastroenterology    PR COLONOSCOPY W/BIOPSY SINGLE/MULTIPLE N/A 01/09/2018    Procedure: COLONOSCOPY, FLEXIBLE, PROXIMAL TO SPLENIC FLEXURE; WITH BIOPSY, SINGLE OR MULTIPLE;  Surgeon: Vonda Antigua, MD;  [facility];  Service: Gastroenterology    PR COLONOSCOPY W/BIOPSY SINGLE/MULTIPLE N/A 08/24/2019    Procedure: COLONOSCOPY, FLEXIBLE, PROXIMAL TO SPLENIC FLEXURE; WITH BIOPSY, SINGLE OR MULTIPLE;  Surgeon: Leland Her, MD;  Location[facility] [place] [facility];  Service: Gastroenterology    PR COLONOSCOPY W/BIOPSY SINGLE/MULTIPLE N/A 08/29/2021    Procedure: COLONOSCOPY, FLEXIBLE, PROXIMAL TO SPLENIC FLEXURE; WITH BIOPSY, SINGLE OR  MULTIPLE;  Surgeon: Luanne Bras, MD;  Location: [facility];  Service: Gastroenterology    PR COLSC FLX WITH DIRECTED SUBMUCOSAL NJX ANY SBST N/A 08/24/2019    Procedure: COLONOSCOPY, FLEXIBLE, PROXIMAL TO SPLENIC FLEXURE; WITH DIRECTED SUBMUCOSAL INJECTION(S), ANY SUBSTANCE;  Surgeon: Leland Her, MD;  Location: GI PROCEDURES [place] [facility];  Service: Gastroenterology    PR EXPLORATORY OF ABDOMEN N/A 01/24/2020    Procedure: EXPLORATORY LAPAROTOMY, EXPLORATORY CELIOTOMY WITH OR WITHOUT BIOPSY(S);  Surgeon: [name], MD;  Location: MAIN OR [place];  Service: Trauma    PR FREEING BOWEL ADHESION,ENTEROLYSIS N/A 01/24/2020    Procedure: ENTEROLYSIS (SEPART PROC);  Surgeon: [name], MD;  Location: MAIN OR [facility];  Service: Trauma    PR IMPLANT MESH HERNIA REPAIR/DEBRIDEMENT CLOSURE N/A 01/03/2015    Procedure: IMPLANTATION OF MESH/OTHER PROSTHES INCISION/VENTRAL HERNIA REPAIR/MESH CLOSE DEBRID NECROT SOFT TIS INFECT;  Surgeon: Romero Belling, MD;  Location: MAIN OR [place];  Service: Gastrointestinal    PR LAP, VENTRAL HERNIA REPAIR,REDUCIBLE N/A 04/07/2014    Procedure: LAPAROSCOPY, SURGICAL, REPAIR, VENTRAL, UMBILICAL, SPIGELIAN OR EPIGASTRIC HERNIA, REDUCIBLE;  Surgeon: Romero Belling, MD;  Location: MAIN OR [place];  Service: Gastrointestinal    PR NEGATIVE PRESSURE WOUND THERAPY DME >50 SQ CM N/A 01/24/2020    Procedure: NEG PRESS WOUND TX (VAC ASSIST) INCL TOPICALS, PER SESSION, TSA GREATER THAN/= 50 CM SQUARED;  Surgeon: [name], MD;  Location: MAIN OR [place];  Service: Trauma    PR REMOVAL NODES, NECK,CERV CMPLT Left 12/07/2012    Procedure: CERVICAL LYMPHADENECTOMY (COMPLETE);  Surgeon: Charlott Rakes, MD;  Location: MAIN OR [facility];  Service: Surgical Oncology    PR REMOVAL NODES, NECK,CERV MOD RAD Right 03/29/2013    Procedure: CERVICAL LYMPHADENECTOMY (MODIFIED RADICAL NECK DISSECTION);  Surgeon: Charlott Rakes, MD;  Location: MAIN OR [facility];  Service: Surgical Oncology    PR REPAIR RECURR INCIS [facility] N/A 01/03/2015    Procedure: REPAIR RECURRENT INCISIONAL OR VENTRAL HERNIA; REDUCIBLE;  Surgeon: Romero Belling, MD;  Location: MAIN OR [facility];  Service: Gastrointestinal    PR REPAIR RECURR INCIS HERNIA,STRANG N/A 06/21/2016    Procedure: REPAIR RECURRENT INCISIONAL OR VENTRAL HERNIA; INCARCERATED OR STRANGULATED;  Surgeon: Mickle Asper, MD;  Location: MAIN OR [facility];  Service: Gastrointestinal    PR REPAIR RECURR INCIS HERNIA,STRANG N/A 01/24/2020    Procedure: REPAIR RECURRENT INCISIONAL OR VENTRAL HERNIA; INCARCERATED OR STRANGULATED;  Surgeon: [name], MD;  Location: MAIN OR [place];  Service: Trauma    PR SIGMOIDOSCOPY,FINE NEEDL BX,US GUIDED N/A 01/09/2018    Procedure: SIGMOIDOSCOPY, FLEXIBLE, W/TRANSENDOSCOPIC ULTRASOUND GUIDED NEEDLE ASPIRATION;  Surgeon: Vonda Antigua, MD;  Location: [facility];  Service: Gastroenterology    THYROID SURGERY         Family History Problem Relation Age of Onset    Hyperthyroidism Mother     Osteoporosis Mother     Arrhythmia Mother     Squamous cell carcinoma Mother         basal cell vs squamous cell skin cancer    Arthritis Mother     Cancer Mother     Coronary artery disease Father         s/p CABG    Diabetes Father     Hypertension Father     Prostate cancer Father     Angina Father     Cancer Father     Heart disease Father     Colon cancer Paternal Grandmother  Diabetes Paternal Grandmother     Diabetes Maternal Aunt     Diabetes Paternal Uncle     Diabetes Paternal Aunt     Thyroid disease Neg Hx       Social History     Socioeconomic History    Marital status: Single   Tobacco Use    Smoking status: Never     Passive exposure: Never    Smokeless tobacco: Never   Vaping Use    Vaping status: Never Used   Substance and Sexual Activity    Alcohol use: Yes     Comment: social drinker 3 times yearly    Drug use: No     Social Drivers of Psychologist, prison and probation services Strain: Low Risk  (03/25/2023)    Overall Financial Resource Strain (CARDIA)     Difficulty of Paying Living Expenses: Not hard at all   Food Insecurity: No Food Insecurity (03/25/2023)    Hunger Vital Sign     Worried About Running Out of Food in the Last Year: Never true     Ran Out of Food in the Last Year: Never true   Transportation Needs: No Transportation Needs (03/25/2023)    PRAPARE - Therapist, art (Medical): No     Lack of Transportation (Non-Medical): No   Physical Activity: Inactive (11/05/2021)    Exercise Vital Sign     Days of Exercise per Week: 0 days     Minutes of Exercise per Session: 0 min   Stress: Stress Concern Present (11/05/2021)    [employer] of Occupational Health - Occupational Stress Questionnaire     Feeling of Stress : To some extent   Social Connections: Moderately Isolated (11/05/2021)    Social Connection and Isolation Panel [NHANES]     Frequency of Communication with Friends and Family: More than three times a week     Frequency of Social Gatherings with Friends and Family: Once a week     Attends Religious Services: More than 4 times per year     Active Member of [employer] or Organizations: No     Attends Banker Meetings: Never     Marital Status: Never married   Housing: Low Risk  (03/25/2023)    Housing     Within the past 12 months, have you ever stayed: outside, in a car, in a tent, in an overnight shelter, or temporarily in someone else's home (i.e. couch-surfing)?: No     Are you worried about losing your housing?: No       Social History     Social History Narrative    Not on file       Allergies: is allergic to compazine [prochlorperazine] and coconut.    Medications:   Meds:   ascorbic acid (vitamin C)  500 mg Oral BID    bacitracin   Topical BID    dexAMETHasone  4 mg Oral Q6H SCH    Followed by    Melene Muller ON 04/09/2023] dexAMETHasone  2 mg Oral Q6H SCH    Followed by    Melene Muller ON 04/11/2023] dexAMETHasone  2 mg Oral Q8H SCH    Followed by    Melene Muller ON 04/13/2023] dexAMETHasone  2 mg Oral Q12H SCH    Followed by    Melene Muller ON 04/16/2023] dexAMETHasone  2 mg Oral Daily    Followed by    Melene Muller ON 04/18/2023] dexAMETHasone  1 mg Oral Daily  DULoxetine  30 mg Oral BID    enoxaparin (LOVENOX) injection  40 mg Subcutaneous QPM    ezetimibe  10 mg Oral Daily    flu vacc [id number] 6mos up(PF)  0.5 mL Intramuscular During hospitalization    folic acid  1,000 mcg Oral Daily    insulin lispro  0-20 Units Subcutaneous ACHS    levETIRAcetam  500 mg Oral BID    levothyroxine  375 mcg Oral Daily    lisinopril  10 mg Oral Daily    loratadine  10 mg Oral Daily    multivitamins (ADULT)  1 tablet Oral Daily    polyethylene glycol  17 g Oral BID    senna  2 tablet Oral BID    sulfaSALAzine  1,000 mg Oral BID    zinc sulfate  220 mg Oral Daily     Continuous Infusions:  PRN Meds:.acetaminophen, bisacodyl, hydrALAZINE, labetalol, morphine, oxyCODONE, oxyCODONE, sodium chloride    Objective:   Vitals: Temp:  [36 ??C (96.8 ??F)-36.9 ??C (98.4 ??F)] 36 ??C (96.8 ??F)  Heart Rate:  [78-105] 105  Resp:  [13-23] 20  BP: (135-171)/(52-96) 150/81  MAP (mmHg):  [77-116] 101  SpO2:  [95 %-96 %] 95 %  No intake/output data recorded.    Physical Exam:  BP 150/81  - Pulse 105  - Temp 36 ??C (96.8 ??F) (Temporal)  - Resp 20  - Ht 188 cm (6' 2)  - Wt (!) 132.4 kg (291 lb 14.2 oz)  - SpO2 95%  - BMI 37.48 kg/m??    GENERAL: Appears stated age.  HEENT: Status post craniotomy  CARDIAC: Warm and well perfused.  LUNGS: Speaking in full sentences.  ABDOMEN: Non-distended.  NEUROLOGICAL: Alert and oriented, responds to questions appropriately.    ECOG Performance Status: 2 - Ambulatory and capable of all selfcare but unable to carry out any work activities. Up and about more than 50% of waking hours    Test Results  Lab Results   Component Value Date    WBC 16.0 (H) 04/06/2023    HGB 12.6 (L) 04/06/2023    HCT 37.0 (L) 04/06/2023    PLT 263 04/06/2023     Lab Results   Component Value Date    NA 140 04/06/2023    K 4.1 04/06/2023    CL 105 04/06/2023    CO2 23.0 04/06/2023    BUN 20 04/06/2023    CREATININE 0.67 (L) 04/06/2023    GLU 175 04/06/2023    CALCIUM 7.4 (L) 04/06/2023    MG 1.8 05/30/2022    PHOS 4.4 10/07/2022     Lab Results   Component Value Date    BILITOT 0.3 04/04/2023    BILIDIR 0.20 01/27/2020    PROT 7.1 04/04/2023    ALBUMIN 3.9 04/04/2023    ALT 99 (H) 04/04/2023    AST 67 (H) 04/04/2023    ALKPHOS 138 (H) 04/04/2023    GGT 42 08/22/2011     Lab Results   Component Value Date    INR 1.18 03/27/2023    APTT 26.0 03/27/2023       Imaging: Radiology studies were personally reviewed

## 2023-04-07 NOTE — Unmapped (Signed)
 Vitals stable. Patient able to communicate pain scale, when needed.

## 2023-04-08 DIAGNOSIS — C439 Malignant melanoma of skin, unspecified: Principal | ICD-10-CM

## 2023-04-08 LAB — RENAL FUNCTION PANEL
ALBUMIN: 3.5 g/dL (ref 3.4–5.0)
ANION GAP: 12 mmol/L (ref 5–14)
BLOOD UREA NITROGEN: 18 mg/dL (ref 9–23)
BUN / CREAT RATIO: 30
CALCIUM: 6.9 mg/dL — ABNORMAL LOW (ref 8.7–10.4)
CHLORIDE: 104 mmol/L (ref 98–107)
CO2: 20 mmol/L (ref 20.0–31.0)
CREATININE: 0.6 mg/dL — ABNORMAL LOW (ref 0.73–1.18)
EGFR CKD-EPI (2021) MALE: >90 mL/min/{1.73_m2} (ref >=60)
GLUCOSE RANDOM: 151 mg/dL (ref 70–179)
PHOSPHORUS: 4.4 mg/dL (ref 2.4–5.1)
POTASSIUM: 4 mmol/L (ref 3.4–4.8)
SODIUM: 136 mmol/L (ref 135–145)

## 2023-04-08 LAB — CBC
HEMATOCRIT: 37.2 % — ABNORMAL LOW (ref 39.0–48.0)
HEMOGLOBIN: 12.7 g/dL — ABNORMAL LOW (ref 12.9–16.5)
MEAN CORPUSCULAR HEMOGLOBIN CONC: 34.1 g/dL (ref 32.0–36.0)
MEAN CORPUSCULAR HEMOGLOBIN: 30.3 pg (ref 25.9–32.4)
MEAN CORPUSCULAR VOLUME: 88.8 fL (ref 77.6–95.7)
MEAN PLATELET VOLUME: 7.8 fL (ref 6.8–10.7)
PLATELET COUNT: 248 10*9/L (ref 150–450)
RED BLOOD CELL COUNT: 4.19 10*12/L — ABNORMAL LOW (ref 4.26–5.60)
RED CELL DISTRIBUTION WIDTH: 17.5 % — ABNORMAL HIGH (ref 12.2–15.2)
WBC ADJUSTED: 13.6 10*9/L — ABNORMAL HIGH (ref 3.6–11.2)

## 2023-04-08 LAB — MAGNESIUM: MAGNESIUM: 2.1 mg/dL (ref 1.6–2.6)

## 2023-04-08 MED ADMIN — levothyroxine (SYNTHROID) tablet 375 mcg: 375 ug | ORAL | @ 10:00:00

## 2023-04-08 MED ADMIN — sulfaSALAzine (AZULFIDINE) tablet 1,000 mg: 1000 mg | ORAL | @ 12:00:00

## 2023-04-08 MED ADMIN — ascorbic acid (vitamin C) (VITAMIN C) tablet 500 mg: 500 mg | ORAL | @ 01:00:00 | Stop: 2023-04-19

## 2023-04-08 MED ADMIN — oxyCODONE (ROXICODONE) immediate release tablet 5 mg: 5 mg | ORAL | @ 18:00:00 | Stop: 2023-04-18

## 2023-04-08 MED ADMIN — insulin lispro (HumaLOG) injection 3 Units: 3 [IU] | SUBCUTANEOUS | @ 23:00:00

## 2023-04-08 MED ADMIN — dexAMETHasone (DECADRON) tablet 4 mg: 4 mg | ORAL | @ 04:00:00 | Stop: 2023-04-09

## 2023-04-08 MED ADMIN — acetaminophen (TYLENOL) tablet 650 mg: 650 mg | ORAL | @ 08:00:00

## 2023-04-08 MED ADMIN — oxyCODONE (ROXICODONE) immediate release tablet 5 mg: 5 mg | ORAL | @ 22:00:00 | Stop: 2023-04-18

## 2023-04-08 MED ADMIN — dexAMETHasone (DECADRON) tablet 4 mg: 4 mg | ORAL | @ 10:00:00 | Stop: 2023-04-09

## 2023-04-08 MED ADMIN — dexAMETHasone (DECADRON) tablet 4 mg: 4 mg | ORAL | @ 15:00:00 | Stop: 2023-04-09

## 2023-04-08 MED ADMIN — ascorbic acid (vitamin C) (VITAMIN C) tablet 500 mg: 500 mg | ORAL | @ 12:00:00 | Stop: 2023-04-19

## 2023-04-08 MED ADMIN — folic acid (FOLVITE) tablet 1,000 mcg: 1000 ug | ORAL | @ 12:00:00

## 2023-04-08 MED ADMIN — levETIRAcetam (KEPPRA) tablet 500 mg: 500 mg | ORAL | @ 01:00:00 | Stop: 2023-04-18

## 2023-04-08 MED ADMIN — multivitamins, therapeutic with minerals tablet 1 tablet: 1 | ORAL | @ 12:00:00

## 2023-04-08 MED ADMIN — acetaminophen (TYLENOL) tablet 650 mg: 650 mg | ORAL | @ 21:00:00

## 2023-04-08 MED ADMIN — insulin lispro (HumaLOG) injection 0-20 Units: 0-20 [IU] | SUBCUTANEOUS | @ 15:00:00

## 2023-04-08 MED ADMIN — bacitracin ointment: TOPICAL | @ 12:00:00 | Stop: 2023-04-12

## 2023-04-08 MED ADMIN — insulin lispro (HumaLOG) injection 0-20 Units: 0-20 [IU] | SUBCUTANEOUS | @ 01:00:00

## 2023-04-08 MED ADMIN — DULoxetine (CYMBALTA) DR capsule 30 mg: 30 mg | ORAL | @ 12:00:00

## 2023-04-08 MED ADMIN — zinc sulfate (ZINCATE) capsule 220 mg: 220 mg | ORAL | @ 12:00:00

## 2023-04-08 MED ADMIN — bacitracin ointment: TOPICAL | @ 01:00:00 | Stop: 2023-04-12

## 2023-04-08 MED ADMIN — senna (SENOKOT) tablet 2 tablet: 2 | ORAL | @ 01:00:00

## 2023-04-08 MED ADMIN — DULoxetine (CYMBALTA) DR capsule 30 mg: 30 mg | ORAL | @ 01:00:00

## 2023-04-08 MED ADMIN — sulfaSALAzine (AZULFIDINE) tablet 1,000 mg: 1000 mg | ORAL | @ 01:00:00

## 2023-04-08 MED ADMIN — dexAMETHasone (DECADRON) tablet 4 mg: 4 mg | ORAL | @ 22:00:00 | Stop: 2023-04-09

## 2023-04-08 MED ADMIN — loratadine (CLARITIN) tablet 10 mg: 10 mg | ORAL | @ 12:00:00

## 2023-04-08 MED ADMIN — oxyCODONE (ROXICODONE) immediate release tablet 5 mg: 5 mg | ORAL | @ 12:00:00 | Stop: 2023-04-18

## 2023-04-08 MED ADMIN — calcium carbonate (TUMS) chewable tablet 300 mg elem calcium: 300 mg | ORAL | @ 18:00:00

## 2023-04-08 MED ADMIN — ezetimibe (ZETIA) tablet 10 mg: 10 mg | ORAL | @ 12:00:00

## 2023-04-08 MED ADMIN — calcitriol (ROCALTROL) capsule 0.25 mcg: .25 ug | ORAL | @ 18:00:00

## 2023-04-08 MED ADMIN — levETIRAcetam (KEPPRA) tablet 500 mg: 500 mg | ORAL | @ 12:00:00 | Stop: 2023-04-18

## 2023-04-08 MED ADMIN — enoxaparin (LOVENOX) syringe 40 mg: 40 mg | SUBCUTANEOUS | @ 22:00:00

## 2023-04-08 MED ADMIN — lisinopril (PRINIVIL,ZESTRIL) tablet 10 mg: 10 mg | ORAL | @ 12:00:00

## 2023-04-08 MED ADMIN — oxyCODONE (ROXICODONE) immediate release tablet 5 mg: 5 mg | ORAL | @ 08:00:00 | Stop: 2023-04-18

## 2023-04-08 MED ADMIN — insulin lispro (HumaLOG) injection 0-20 Units: 0-20 [IU] | SUBCUTANEOUS | @ 22:00:00

## 2023-04-08 NOTE — Unmapped (Signed)
 [facility]   INPATIENT PROGRESS NOTE      Brief History of Present Illness  Kenneth Fitzgerald is a 48 y.o. male with metastatic melanoma who is now sp R craniotomy for resection (3/21)    Subjective/Interval History  NAEON. 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)  POD5 R craniotomy  - Na>135  - SBP<160  - dexamethasone taper over 2 weeks   - post op MRI and CT completed  - gamma tiles trial  - keppra 500mg  bidx14d  - follow up path, frozen consistent with metastatic melanoma   - oncology following for initiation of braf/mek inhibitors    - Holding per oncology  - PT/OT  - ok to gently wash head    Anticoagulant/Antiplatelet needs: None    Disposition: floor, stable for dispo    ___________________________________________________________________    Neurological Exam  EOSp  AOX3  PERRL  EOMI  FS  TML  RUE/RLE 5  LUE/LLE 5  SILT  No drift  Incision cdi staples      The patient's vitals, intake/output, labs, orders, and relevant imaging were reviewed for the last 24 hours.    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

## 2023-04-08 NOTE — Unmapped (Signed)
 Endocrine Team Diabetes New Consult Note     Consult information:  Requesting Attending Physician : Arman Filter, MD  Service Requesting Consult : Neurosurgery [facility])  Primary Care Provider: Rosine Beat, Benison Pap, DO  Impression:  Kenneth Fitzgerald is a 48 y.o. male admitted for R craniotomy s/p resection of brain mass. We have been consulted at the request of Arman Filter, MD to evaluate Kenneth Fitzgerald for hyperglycemia.     Medical Decision Making:  Diagnoses:  1. Prediabetes/Medication induced hyperglycemia. Uncontrolled   With hyperglycemia.  2. Nutrition: Complicating glycemic control. Increasing risk for both hypoglycemia and hyperglycemia.  3. Steroids. Complicating glycemic control and increasing risk for hyperglycemia.  4. Obesity. Complicating glycemic control and increasing risk for hyperglycemia.  5. Hypothyroidism  6. 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 Anemia noted. Normal sodium. Normal potassium. Intermittent hyperglycemia. A1C in pre-diabetes range No kidney dysfunction noted.    Notes reviewed: Primary team and nursing notes      Overall impression based on above reviews and history:  1. Prediabetes/ Medication induced hyperglycemia: Patient noted to be hyperglycemic over the last 24 hours in setting of high dose steroids post-surgically. Will add on low dose prandial insulin today. Will continue correction. FBG in goal this AM. Patient does not appear to have basal insulin requirement at this time    Recommendations:  - Lispro 3u TIDAC  - Lispro 1:30>140 ACHS  - Hypoglycemia protocol.  - POCT-BG achs.  - Ensure patient is on glucose precautions if patient taking nutrition by mouth.     2. Hypothyroidism: well-managed on levothyroxine outpatient. No clinical findings suggestive of hypothyroidism at this time. Recommend continuing home dose. TSH normal when checked on 9/23 and FT4 found to be LLN. Followed by outpatient endocrinologist and plan to repeat in 1-2 months.   - Continue levothyroxine daily    3. 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 Given low calcium with normal albumin, will add on some supplemental calcium today. Reminded patient to still supplement with calcium in meals throughout the day  - Start Calcium carbonate 750mg  BID  - Start calcitriol 0.96mcg daily    Discharge planning:  In process. Will complete closer to discharge.Anticipate being able to discharge off antihyperglycemic agents.     Thank you for this consult. Discussed plan with primary team. We will continue to follow and make recommendations and place orders as appropriate.    Please page with questions or concerns: Marisue Humble, [place]: [phone]  Endocrinology Diabetes Care Team on call from 6AM - 3PM on weekdays then endocrine fellow on call: [phone] from 3PM - 6AM on weekdays and on weekends and holidays.   If APP cannot be reached, please page the endocrine fellow on call.      Subjective:  Initial HPI:  Kenneth Fitzgerald is a 48 y.o. male with past medical history of preDM, thyroid cancer, hypothyroidism, hypoparathyroidism, admitted for metastatic melanoma who is now sp R craniotomy for resection (3/21)     Diabetes History:  Patient has history of Prediabetes but is not on any medications at home     Patient is seen by Dr. Lyda Perone at [facility] endocrinology outpatient      Current Nutrition:  Active Orders   Diet    Nutrition Therapy Regular/House       ROS:  As per HPI.     ascorbic acid (vitamin C)  500 mg Oral BID    bacitracin   Topical BID    dexAMETHasone  4 mg Oral Q6H SCH    Followed by    Melene Muller ON 04/09/2023] dexAMETHasone  2 mg Oral Q6H SCH    Followed by    Melene Muller ON 04/11/2023] dexAMETHasone  2 mg Oral Q8H SCH    Followed by    Melene Muller ON 04/13/2023] dexAMETHasone  2 mg Oral Q12H SCH    Followed by Melene Muller ON 04/16/2023] dexAMETHasone  2 mg Oral Daily    Followed by    Melene Muller ON 04/18/2023] dexAMETHasone  1 mg Oral Daily    DULoxetine  30 mg Oral BID    enoxaparin (LOVENOX) injection  40 mg Subcutaneous QPM    ezetimibe  10 mg Oral Daily    flu vacc [id number] 6mos up(PF)  0.5 mL Intramuscular During hospitalization    folic acid  1,000 mcg Oral Daily    insulin lispro  0-20 Units Subcutaneous ACHS    insulin lispro  3 Units Subcutaneous 3xd Meals    levETIRAcetam  500 mg Oral BID    levothyroxine  375 mcg Oral Daily    lisinopril  10 mg Oral Daily    loratadine  10 mg Oral Daily    multivitamins (ADULT)  1 tablet Oral Daily    polyethylene glycol  17 g Oral BID    senna  2 tablet Oral BID    sulfaSALAzine  1,000 mg Oral BID    zinc sulfate  220 mg Oral Daily       Current Outpatient Medications   Medication Instructions    acetaminophen (TYLENOL) 1,000 mg, 2 times a day (standard)    ALLEGRA ALLERGY 180 mg, Daily PRN    blood sugar diagnostic Strp Dispense 100 blood glucose test strips, ok to sub any brand preferred by insurance/patient, use 3x/day; dispense whatever brand matches with meter.    blood-glucose meter kit Use as instructed; dispense 1 meter, whatever is preferred by insurance    BRAFTOVI 450 mg, Oral, Daily (standard)    calcitriol (ROCALTROL) 0.25 mcg, Oral, Daily (standard)    calcium carbonate 1,500 mg (600 mg elem calcium) tablet 2 tablets, Oral, Daily PRN    dexAMETHasone (DECADRON) 6 mg, Oral, Every 12 hours    DULoxetine (CYMBALTA) 30 MG capsule 1 capsule, 2 times a day (standard)    ezetimibe (ZETIA) 10 mg, Oral, Daily (standard)    fluticasone propionate (FLONASE) 50 mcg/actuation nasal spray 1 spray, Daily (standard)    folic acid (FOLVITE) 1,000 mcg, Oral, Daily (standard)    lancets Misc Dispense 100 lancets, ok to sub any brand preferred by insurance/patient, use 3x/day    [name] 45 mg, Oral, 2 times a day (standard)    ondansetron (ZOFRAN) 8 mg, Oral, Every 8 hours PRN    sulfaSALAzine (AZULFIDINE) 1,000 mg, Oral, 2 times a day    SYNTHROID 375 mcg, Oral, Daily (standard)    zinc sulfate (ZINCATE) 220 mg, Oral, Daily (standard)           Past Medical History:   Diagnosis Date    Ankylosing spondylitis (CMS-HCC)     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  Thyroidectomy       Past Surgical History:   Procedure Laterality Date    COLON SURGERY      Partial Colectomy    COLONOSCOPY      COSMETIC SURGERY      Related to excision of Cancer.    PR COLONOSCOPY FLX DX W/COLLJ SPEC WHEN PFRMD N/A 08/29/2021    Procedure: COLONOSCOPY, FLEXIBLE, PROXIMAL TO SPLENIC FLEXURE; DIAGNOSTIC, W/WO COLLECTION SPECIMEN BY BRUSH OR WASH;  Surgeon: Luanne Bras, MD;  Location: [facility];  Service: Gastroenterology    PR COLONOSCOPY W/BIOPSY SINGLE/MULTIPLE N/A 01/09/2018    Procedure: COLONOSCOPY, FLEXIBLE, PROXIMAL TO SPLENIC FLEXURE; WITH BIOPSY, SINGLE OR MULTIPLE;  Surgeon: Vonda Antigua, MD;  [facility];  Service: Gastroenterology    PR COLONOSCOPY W/BIOPSY SINGLE/MULTIPLE N/A 08/24/2019    Procedure: COLONOSCOPY, FLEXIBLE, PROXIMAL TO SPLENIC FLEXURE; WITH BIOPSY, SINGLE OR MULTIPLE;  Surgeon: Leland Her, MD;  Location: [facility] PROCEDURES [facility];  Service: Gastroenterology    PR COLONOSCOPY W/BIOPSY SINGLE/MULTIPLE N/A 08/29/2021    Procedure: COLONOSCOPY, FLEXIBLE, PROXIMAL TO SPLENIC FLEXURE; WITH BIOPSY, SINGLE OR MULTIPLE;  Surgeon: Luanne Bras, MD;  Location: [facility];  Service: Gastroenterology    PR COLSC FLX WITH DIRECTED SUBMUCOSAL NJX ANY SBST N/A 08/24/2019    Procedure: COLONOSCOPY, FLEXIBLE, PROXIMAL TO SPLENIC FLEXURE; WITH DIRECTED SUBMUCOSAL INJECTION(S), ANY SUBSTANCE;  Surgeon: Leland Her, MD;  Location: [facility] PROCEDURES [facility];  Service: Gastroenterology    PR EXPLORATORY OF ABDOMEN N/A 01/24/2020    Procedure: EXPLORATORY LAPAROTOMY, EXPLORATORY CELIOTOMY WITH OR WITHOUT BIOPSY(S);  Surgeon: [name], MD;  Location: MAIN OR [place];  Service: Trauma    PR FREEING BOWEL ADHESION,ENTEROLYSIS N/A 01/24/2020    Procedure: ENTEROLYSIS (SEPART PROC);  Surgeon: [name], MD;  Location: MAIN OR [facility];  Service: Trauma    PR IMPLANT MESH HERNIA REPAIR/DEBRIDEMENT CLOSURE N/A 01/03/2015    Procedure: IMPLANTATION OF MESH/OTHER PROSTHES INCISION/VENTRAL HERNIA REPAIR/MESH CLOSE DEBRID NECROT SOFT TIS INFECT;  Surgeon: Romero Belling, MD;  Location: MAIN OR [place];  Service: Gastrointestinal    PR LAP, VENTRAL HERNIA REPAIR,REDUCIBLE N/A 04/07/2014    Procedure: LAPAROSCOPY, SURGICAL, REPAIR, VENTRAL, UMBILICAL, SPIGELIAN OR EPIGASTRIC HERNIA, REDUCIBLE;  Surgeon: Romero Belling, MD;  Location: MAIN OR [place];  Service: Gastrointestinal    PR NEGATIVE PRESSURE WOUND THERAPY DME >50 SQ CM N/A 01/24/2020    Procedure: NEG PRESS WOUND TX (VAC ASSIST) INCL TOPICALS, PER SESSION, TSA GREATER THAN/= 50 CM SQUARED;  Surgeon: [name], MD;  Location: MAIN OR [place];  Service: Trauma    PR REMOVAL NODES, NECK,CERV CMPLT Left 12/07/2012    Procedure: CERVICAL LYMPHADENECTOMY (COMPLETE);  Surgeon: Charlott Rakes, MD;  Location: MAIN OR [facility];  Service: Surgical Oncology    PR REMOVAL NODES, NECK,CERV MOD RAD Right 03/29/2013    Procedure: CERVICAL LYMPHADENECTOMY (MODIFIED RADICAL NECK DISSECTION);  Surgeon: Charlott Rakes, MD;  Location: MAIN OR [facility];  Service: Surgical Oncology    PR REPAIR [facility] N/A 01/03/2015    Procedure: REPAIR RECURRENT INCISIONAL OR VENTRAL HERNIA; REDUCIBLE;  Surgeon: Romero Belling, MD;  Location: MAIN OR [facility];  Service: Gastrointestinal    PR REPAIR RECURR INCIS HERNIA,STRANG N/A 06/21/2016    Procedure: REPAIR RECURRENT INCISIONAL OR VENTRAL HERNIA; INCARCERATED OR STRANGULATED;  Surgeon: Mickle Asper, MD;  Location: MAIN OR [facility];  Service: Gastrointestinal    PR REPAIR RECURR INCIS HERNIA,STRANG N/A 01/24/2020    Procedure: REPAIR RECURRENT INCISIONAL OR VENTRAL HERNIA; INCARCERATED OR STRANGULATED;  Surgeon: [name], MD;  Location: MAIN OR [place];  Service: Trauma    PR SIGMOIDOSCOPY,FINE NEEDL BX,US GUIDED N/A 01/09/2018    Procedure: SIGMOIDOSCOPY, FLEXIBLE, W/TRANSENDOSCOPIC ULTRASOUND GUIDED NEEDLE ASPIRATION;  Surgeon: Vonda Antigua, MD;  Location: [facility];  Service: Gastroenterology    THYROID SURGERY         Family History   Problem Relation Age of Onset    Hyperthyroidism Mother     Osteoporosis Mother     Arrhythmia Mother     Squamous cell carcinoma Mother         basal cell vs squamous cell skin cancer    Arthritis Mother     Cancer Mother     Coronary artery disease Father         s/p CABG    Diabetes Father     Hypertension Father     Prostate cancer Father     Angina Father     Cancer Father     Heart disease Father     Colon cancer Paternal Grandmother     Diabetes Paternal Grandmother     Diabetes Maternal Aunt     Diabetes Paternal Uncle     Diabetes Paternal Aunt     Thyroid disease Neg Hx        Social History     Tobacco Use    Smoking status: Never     Passive exposure: Never    Smokeless tobacco: Never   Vaping Use    Vaping status: Never Used   Substance Use Topics    Alcohol use: Yes     Comment: social drinker 3 times yearly    Drug use: No       OBJECTIVE:  BP 146/64  - Pulse 73  - Temp 36.4 ??C (97.5 ??F) (Temporal)  - Resp 17  - Ht 188 cm (6' 2) - Wt (!) 132.4 kg (291 lb 14.2 oz)  - SpO2 97%  - BMI 37.48 kg/m??   Wt Readings from Last 12 Encounters:   04/07/23 (!) 132.4 kg (291 lb 14.2 oz)   03/27/23 (!) 140.3 kg (309 lb 4.9 oz)   03/24/23 (!) 140.3 kg (309 lb 4.9 oz)   02/18/23 (!) 144 kg (317 lb 6.4 oz)   02/12/23 (!) 145.2 kg (320 lb 1.7 oz)   02/04/23 (!) 145.6 kg (321 lb)   01/30/23 (!) 146.1 kg (322 lb)   12/30/22 (!) 143.8 kg (317 lb 1.6 oz)   11/14/22 (!) 144.1 kg (317 lb 9.6 oz)   10/30/22 (!) 145.7 kg (321 lb 3.2 oz)   10/07/22 (!) (P) 143.3 kg (316 lb)   08/30/22 (!) 143.2 kg (315 lb 12.8 oz)     Physical Exam  Nursing note reviewed.   HENT:      Head: Normocephalic.      Comments: Scalp incision c/d/I with staples  Eyes:  General: No scleral icterus.     Conjunctiva/sclera: Conjunctivae normal.   Pulmonary:      Effort: Pulmonary effort is normal. No respiratory distress.   Skin:     General: Skin is warm and dry.   Neurological:      Mental Status: He is alert and oriented to person, place, and time.   Psychiatric:         Behavior: Behavior normal.             BG/insulin reviewed per EMR.   Glucose, POC   Date Value   04/08/2023 170 mg/dL   16/10/9602 540 mg/dL   98/11/9145 829 mg/dL (H)   56/21/3086 578 mg/dL (H)   46/96/2952 841 mg/dL (H)   32/44/0102 725 mg/dL   36/64/4034 742 mg/dL (H)   59/56/3875 643 mg/dL   32/95/1884 90 mg/dL   16/60/6301 98 mg/dL   60/10/9321 95 mg/dL   55/73/2202 94 mg/dL   54/27/0623 762 MG/DL   83/15/1761 94 MG/DL   60/73/7106 269 MG/DL        Summary of labs:  Lab Results   Component Value Date    A1C 5.8 (H) 04/06/2023    A1C 5.4 05/10/2020    A1C 5.8 (H) 01/03/2020     Lab Results   Component Value Date    GFR >= 60 03/23/2012    CREATININE 0.60 (L) 04/08/2023     Lab Results   Component Value Date    WBC 13.6 (H) 04/08/2023    HGB 12.7 (L) 04/08/2023    HCT 37.2 (L) 04/08/2023    PLT 248 04/08/2023       Lab Results   Component Value Date    NA 136 04/08/2023    K 4.0 04/08/2023    CL 104 04/08/2023    CO2 20.0 04/08/2023    BUN 18 04/08/2023    CREATININE 0.60 (L) 04/08/2023    GLU 151 04/08/2023    CALCIUM 6.9 (L) 04/08/2023    MG 2.1 04/08/2023    PHOS 4.4 04/08/2023       Lab Results   Component Value Date    BILITOT 0.3 04/04/2023    BILIDIR 0.20 01/27/2020    PROT 7.1 04/04/2023    ALBUMIN 3.5 04/08/2023    ALT 99 (H) 04/04/2023    AST 67 (H) 04/04/2023    ALKPHOS 138 (H) 04/04/2023    GGT 42 08/22/2011

## 2023-04-08 NOTE — Unmapped (Signed)
 Problem: Wound  Goal: Optimal Coping  Outcome: Progressing  Goal: Optimal Functional Ability  Outcome: Progressing  Intervention: Optimize Functional Ability  Recent Flowsheet Documentation  Taken 04/07/2023 2000 by Rayburn Ma, [place], RN  Activity Management: bedrest  Goal: Absence of Infection Signs and Symptoms  Outcome: Progressing  Intervention: Prevent or Manage Infection  Recent Flowsheet Documentation  Taken 04/07/2023 2000 by Rayburn Ma, [place], RN  Infection Management: aseptic technique maintained  Goal: Improved Oral Intake  Outcome: Progressing  Goal: Optimal Pain Control and Function  Outcome: Progressing  Goal: Skin Health and Integrity  Outcome: Progressing  Intervention: Optimize Skin Protection  Recent Flowsheet Documentation  Taken 04/07/2023 2000 by Rayburn Ma, [place], RN  Activity Management: bedrest  Pressure Reduction Techniques: frequent weight shift encouraged  Pressure Reduction Devices: positioning supports utilized  Skin Protection: adhesive use limited  Goal: Optimal Wound Healing  Outcome: Progressing     Problem: Adult Inpatient Plan of Care  Goal: Plan of Care Review  Outcome: Progressing  Goal: Patient-Specific Goal (Individualized)  Outcome: Progressing  Goal: Absence of Hospital-Acquired Illness or Injury  Outcome: Progressing  Intervention: Identify and Manage Fall Risk  Recent Flowsheet Documentation  Taken 04/07/2023 2000 by Rayburn Ma, [place], RN  Safety Interventions:   fall reduction program maintained   lighting adjusted for tasks/safety   low bed   nonskid shoes/slippers when out of bed  Intervention: Prevent Skin Injury  Recent Flowsheet Documentation  Taken 04/07/2023 2000 by Rayburn Ma, [place], RN  Positioning for Skin: Supine/Back  Device Skin Pressure Protection: absorbent pad utilized/changed  Skin Protection: adhesive use limited  Intervention: Prevent and Manage VTE (Venous Thromboembolism) Risk  Recent Flowsheet Documentation  Taken 04/07/2023 2200 by Rayburn Ma, [place], RN  Anti-Embolism Device Type: SCD, Knee  Anti-Embolism Device Status: Refused  Anti-Embolism Device Location: BLE  Taken 04/07/2023 2000 by Rayburn Ma, [place], RN  Anti-Embolism Device Type: SCD, Knee  Anti-Embolism Device Status: Refused  Anti-Embolism Device Location: BLE  Intervention: Prevent Infection  Recent Flowsheet Documentation  Taken 04/07/2023 2000 by Rayburn Ma, [place], RN  Infection Prevention: cohorting utilized  Goal: Optimal Comfort and Wellbeing  Outcome: Progressing  Goal: Readiness for Transition of Care  Outcome: Progressing  Goal: Rounds/Family Conference  Outcome: Progressing     Problem: Fall Injury Risk  Goal: Absence of Fall and Fall-Related Injury  Outcome: Progressing  Intervention: Promote Scientist, clinical (histocompatibility and immunogenetics) Documentation  Taken 04/07/2023 2000 by Rayburn Ma, [place], RN  Safety Interventions:   fall reduction program maintained   lighting adjusted for tasks/safety   low bed   nonskid shoes/slippers when out of bed     Problem: Skin Injury Risk Increased  Goal: Skin Health and Integrity  Outcome: Progressing  Intervention: Optimize Skin Protection  Recent Flowsheet Documentation  Taken 04/07/2023 2000 by Rayburn Ma, [place], RN  Activity Management: bedrest  Pressure Reduction Techniques: frequent weight shift encouraged  Pressure Reduction Devices: positioning supports utilized  Skin Protection: adhesive use limited     Problem: Self-Care Deficit  Goal: Improved Ability to Complete Activities of Daily Living  Outcome: Progressing

## 2023-04-08 NOTE — Unmapped (Signed)
 Vitals remain stable. Pain well controlled (see MAR). Wounds remain clean and intact. No signs of new skin breakdown. Voiding adequately. Completing ADLs with minimal assistance. Safety precautions in place.     Problem: Wound  Goal: Optimal Coping  Outcome: Progressing  Goal: Optimal Functional Ability  Outcome: Progressing  Goal: Absence of Infection Signs and Symptoms  Outcome: Progressing  Goal: Improved Oral Intake  Outcome: Progressing  Goal: Optimal Pain Control and Function  Outcome: Progressing  Goal: Skin Health and Integrity  Outcome: Progressing  Goal: Optimal Wound Healing  Outcome: Progressing     Problem: Adult Inpatient Plan of Care  Goal: Plan of Care Review  Outcome: Progressing  Goal: Patient-Specific Goal (Individualized)  Outcome: Progressing  Goal: Absence of Hospital-Acquired Illness or Injury  Outcome: Progressing  Intervention: Identify and Manage Fall Risk  Recent Flowsheet Documentation  Taken 04/08/2023 0825 by Donney Dice, RN  Safety Interventions:   fall reduction program maintained   low bed  Goal: Optimal Comfort and Wellbeing  Outcome: Progressing  Goal: Readiness for Transition of Care  Outcome: Progressing  Goal: Rounds/Family Conference  Outcome: Progressing     Problem: Fall Injury Risk  Goal: Absence of Fall and Fall-Related Injury  Outcome: Progressing  Intervention: Promote Injury-Free Environment  Recent Flowsheet Documentation  Taken 04/08/2023 0825 by Donney Dice, RN  Safety Interventions:   fall reduction program maintained   low bed     Problem: Skin Injury Risk Increased  Goal: Skin Health and Integrity  Outcome: Progressing     Problem: Self-Care Deficit  Goal: Improved Ability to Complete Activities of Daily Living  Outcome: Progressing
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
## 2023-04-09 LAB — BASIC METABOLIC PANEL
ANION GAP: 12 mmol/L (ref 5–14)
BLOOD UREA NITROGEN: 18 mg/dL (ref 9–23)
BUN / CREAT RATIO: 24
CALCIUM: 7.3 mg/dL — ABNORMAL LOW (ref 8.7–10.4)
CHLORIDE: 101 mmol/L (ref 98–107)
CO2: 22 mmol/L (ref 20.0–31.0)
CREATININE: 0.75 mg/dL (ref 0.73–1.18)
EGFR CKD-EPI (2021) MALE: >90 mL/min/{1.73_m2} (ref >=60)
GLUCOSE RANDOM: 127 mg/dL (ref 70–179)
POTASSIUM: 3.9 mmol/L (ref 3.4–4.8)
SODIUM: 135 mmol/L (ref 135–145)

## 2023-04-09 MED ADMIN — insulin lispro (HumaLOG) injection 0-20 Units: 0-20 [IU] | SUBCUTANEOUS | @ 21:00:00

## 2023-04-09 MED ADMIN — dexAMETHasone (DECADRON) tablet 4 mg: 4 mg | ORAL | @ 05:00:00 | Stop: 2023-04-09

## 2023-04-09 MED ADMIN — calcium carbonate (TUMS) chewable tablet 300 mg elem calcium: 300 mg | ORAL | @ 13:00:00

## 2023-04-09 MED ADMIN — ascorbic acid (vitamin C) (VITAMIN C) tablet 500 mg: 500 mg | ORAL | @ 13:00:00 | Stop: 2023-04-19

## 2023-04-09 MED ADMIN — ezetimibe (ZETIA) tablet 10 mg: 10 mg | ORAL | @ 13:00:00

## 2023-04-09 MED ADMIN — calcium carbonate (TUMS) chewable tablet 300 mg elem calcium: 300 mg | ORAL

## 2023-04-09 MED ADMIN — zinc sulfate (ZINCATE) capsule 220 mg: 220 mg | ORAL | @ 13:00:00

## 2023-04-09 MED ADMIN — dexAMETHasone (DECADRON) tablet 2 mg: 2 mg | ORAL | @ 21:00:00 | Stop: 2023-04-11

## 2023-04-09 MED ADMIN — loratadine (CLARITIN) tablet 10 mg: 10 mg | ORAL | @ 13:00:00

## 2023-04-09 MED ADMIN — sulfaSALAzine (AZULFIDINE) tablet 1,000 mg: 1000 mg | ORAL

## 2023-04-09 MED ADMIN — levETIRAcetam (KEPPRA) tablet 500 mg: 500 mg | ORAL | @ 13:00:00 | Stop: 2023-04-18

## 2023-04-09 MED ADMIN — dexAMETHasone (DECADRON) tablet 4 mg: 4 mg | ORAL | @ 10:00:00 | Stop: 2023-04-09

## 2023-04-09 MED ADMIN — DULoxetine (CYMBALTA) DR capsule 30 mg: 30 mg | ORAL | @ 13:00:00

## 2023-04-09 MED ADMIN — enoxaparin (LOVENOX) syringe 40 mg: 40 mg | SUBCUTANEOUS | @ 21:00:00

## 2023-04-09 MED ADMIN — insulin lispro (HumaLOG) injection 3 Units: 3 [IU] | SUBCUTANEOUS | @ 23:00:00

## 2023-04-09 MED ADMIN — insulin NPH (HumuLIN,NovoLIN) injection 4 Units: 4 [IU] | SUBCUTANEOUS | @ 15:00:00

## 2023-04-09 MED ADMIN — sulfaSALAzine (AZULFIDINE) tablet 1,000 mg: 1000 mg | ORAL | @ 13:00:00

## 2023-04-09 MED ADMIN — multivitamins, therapeutic with minerals tablet 1 tablet: 1 | ORAL | @ 13:00:00

## 2023-04-09 MED ADMIN — calcitriol (ROCALTROL) capsule 0.25 mcg: .25 ug | ORAL | @ 13:00:00

## 2023-04-09 MED ADMIN — bacitracin ointment: TOPICAL | Stop: 2023-04-12

## 2023-04-09 MED ADMIN — insulin lispro (HumaLOG) injection 0-20 Units: 0-20 [IU] | SUBCUTANEOUS | @ 02:00:00

## 2023-04-09 MED ADMIN — bacitracin ointment: TOPICAL | @ 13:00:00 | Stop: 2023-04-12

## 2023-04-09 MED ADMIN — dexAMETHasone (DECADRON) tablet 4 mg: 4 mg | ORAL | @ 17:00:00 | Stop: 2023-04-09

## 2023-04-09 MED ADMIN — DULoxetine (CYMBALTA) DR capsule 30 mg: 30 mg | ORAL

## 2023-04-09 MED ADMIN — insulin lispro (HumaLOG) injection 3 Units: 3 [IU] | SUBCUTANEOUS | @ 13:00:00

## 2023-04-09 MED ADMIN — levETIRAcetam (KEPPRA) tablet 500 mg: 500 mg | ORAL | Stop: 2023-04-18

## 2023-04-09 MED ADMIN — lisinopril (PRINIVIL,ZESTRIL) tablet 10 mg: 10 mg | ORAL | @ 13:00:00

## 2023-04-09 MED ADMIN — insulin lispro (HumaLOG) injection 3 Units: 3 [IU] | SUBCUTANEOUS | @ 17:00:00

## 2023-04-09 MED ADMIN — ascorbic acid (vitamin C) (VITAMIN C) tablet 500 mg: 500 mg | ORAL | Stop: 2023-04-19

## 2023-04-09 MED ADMIN — levothyroxine (SYNTHROID) tablet 375 mcg: 375 ug | ORAL | @ 10:00:00

## 2023-04-09 MED ADMIN — folic acid (FOLVITE) tablet 1,000 mcg: 1000 ug | ORAL | @ 13:00:00 | Stop: 2023-04-09

## 2023-04-09 MED ADMIN — oxyCODONE (ROXICODONE) immediate release tablet 10 mg: 10 mg | ORAL | @ 18:00:00 | Stop: 2023-04-12

## 2023-04-09 NOTE — Unmapped (Signed)
 Problem: Wound  Goal: Optimal Coping  Outcome: Ongoing - Unchanged  Goal: Optimal Functional Ability  Outcome: Ongoing - Unchanged  Goal: Absence of Infection Signs and Symptoms  Outcome: Ongoing - Unchanged  Intervention: Prevent or Manage Infection  Recent Flowsheet Documentation  Taken 04/09/2023 1200 by Luciana Axe, RN  Infection Management: aseptic technique maintained  Taken 04/09/2023 1000 by Luciana Axe, RN  Infection Management: aseptic technique maintained  Taken 04/09/2023 0800 by Luciana Axe, RN  Infection Management: aseptic technique maintained  Goal: Improved Oral Intake  Outcome: Ongoing - Unchanged  Goal: Optimal Pain Control and Function  Outcome: Ongoing - Unchanged  Goal: Skin Health and Integrity  Outcome: Ongoing - Unchanged  Intervention: Optimize Skin Protection  Recent Flowsheet Documentation  Taken 04/09/2023 1600 by Luciana Axe, RN  Pressure Reduction Techniques: frequent weight shift encouraged  Taken 04/09/2023 1400 by Luciana Axe, RN  Pressure Reduction Techniques: frequent weight shift encouraged  Taken 04/09/2023 1200 by Luciana Axe, RN  Pressure Reduction Techniques: frequent weight shift encouraged  Taken 04/09/2023 1000 by Luciana Axe, RN  Pressure Reduction Techniques: frequent weight shift encouraged  Taken 04/09/2023 0800 by Luciana Axe, RN  Pressure Reduction Techniques: frequent weight shift encouraged  Goal: Optimal Wound Healing  Outcome: Ongoing - Unchanged

## 2023-04-09 NOTE — Unmapped (Signed)
 [facility]   INPATIENT PROGRESS NOTE      Brief History of Present Illness  Kenneth Fitzgerald is a 48 y.o. male with metastatic melanoma who is now sp R craniotomy for resection (3/21)    Subjective/Interval History  NAEON. Medically ready for SNF.     Interval Imaging Reviewed  none    Neurological Assessment and Plan  **Metastasis to brain  POD6 R craniotomy  - Na>135  - SBP<160  - dexamethasone taper over 2 weeks   - post op MRI and CT completed  - gamma tiles trial  - keppra 500mg  bidx14d  - follow up path, frozen consistent with metastatic melanoma   - oncology following for initiation of braf/mek inhibitors    - Holding per oncology  - PT/OT  - ok to gently wash head    Anticoagulant/Antiplatelet needs: None    Disposition: floor, stable for dispo    ___________________________________________________________________    Neurological Exam  EOSp  AOX3  PERRL  EOMI  FS  TML  RUE/RLE 5  LUE/LLE 5  SILT  No drift  Incision cdi staples      The patient's vitals, intake/output, labs, orders, and relevant imaging were reviewed for the last 24 hours.    Problem List  Principal Problem:    Metastasis to brain

## 2023-04-09 NOTE — Unmapped (Signed)
 Endocrine Team Diabetes Follow Up   Consult Note     Consult information:  Requesting Attending Physician : Arman Filter, MD  Service Requesting Consult : Neurosurgery [facility])  Primary Care Provider: Rosine Beat, Benison Pap, DO  Impression:  Kenneth Fitzgerald is a 48 y.o. male admitted for R craniotomy s/p resection of brain mass. We have been consulted at the request of Arman Filter, MD to evaluate Kenneth Fitzgerald for hyperglycemia.     Medical Decision Making:  Diagnoses:  1. Prediabetes/Medication induced hyperglycemia. Uncontrolled   With severe hyperglycemia last 24 hours.  2. Nutrition: Complicating glycemic control. Increasing risk for both hypoglycemia and hyperglycemia.  3. Steroids. Complicating glycemic control and increasing risk for hyperglycemia.  4. Obesity. Complicating glycemic control and increasing risk for hyperglycemia.  5. Hypothyroidism  6. 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 Anemia noted. Normal sodium. Normal potassium. Hyperglycemia with severe. No kidney dysfunction noted. Calcium still below target range but improving  Notes reviewed: Primary team and nursing notes      Overall impression based on above reviews and history:  1. Prediabetes/ Medication induced hyperglycemia: patient noted to have severe hyperglycemia yesterday evening likely 2/2 BG being checked shortly after meal intake. Also likely d/t high dose steroids daily. Will plan to add on low dose basal insulin dose in AM only to help blunt prandial rise associated with steroids. Will continue to monitor closely    Recommendations:  - NPH 4u in AM  - Lispro 3u TIDAC  - Lispro 1:30>140 ACHS  - Hypoglycemia protocol.  - POCT-BG achs.  - Ensure patient is on glucose precautions if patient taking nutrition by mouth.     2. Hypothyroidism: well-managed on levothyroxine outpatient. No clinical findings suggestive of hypothyroidism at this time. Recommend continuing home dose. TSH normal when checked on 9/23 and FT4 found to be LLN. Followed by outpatient endocrinologist and plan to repeat in 1-2 months.   - Continue levothyroxine daily    3. 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 Given low calcium with normal albumin, will add on some supplemental calcium today. Reminded patient to still supplement with calcium in meals throughout the day  - Start Calcium carbonate 750mg  BID  - Start calcitriol 0.1mcg daily    Discharge planning:  In process. Will complete closer to discharge.Anticipate being able to discharge off antihyperglycemic agents.     Thank you for this consult. Discussed plan with primary team. We will continue to follow and make recommendations and place orders as appropriate.    Please page with questions or concerns: Marisue Humble, [place]: [phone]  Endocrinology Diabetes Care Team on call from 6AM - 3PM on weekdays then endocrine fellow on call: [phone] from 3PM - 6AM on weekdays and on weekends and holidays.   If APP cannot be reached, please page the endocrine fellow on call.      Subjective:    Interval History:  Patient states he is doing well. He is concerned that Bgs are being checked shortly after meal intake. Denies any nausea or vomiting. Patient is drinking slushie for lunch    Initial HPI:  Kenneth Fitzgerald is a 48 y.o. male with past medical history of preDM, thyroid cancer, hypothyroidism, hypoparathyroidism, admitted for metastatic melanoma who is now sp R craniotomy for resection (3/21)  Diabetes History:  Patient has history of Prediabetes but is not on any medications at home     Patient is seen by Dr. Lyda Perone at [facility] endocrinology outpatient      Current Nutrition:  Active Orders   Diet    Nutrition Therapy Regular/House       ROS: As per HPI.     ascorbic acid (vitamin C)  500 mg Oral BID bacitracin   Topical BID    calcitriol  0.25 mcg Oral Daily    calcium carbonate  300 mg elem calcium Oral BID    dexAMETHasone  2 mg Oral Q6H SCH    Followed by    Melene Muller ON 04/11/2023] dexAMETHasone  2 mg Oral Q8H SCH    Followed by    Melene Muller ON 04/13/2023] dexAMETHasone  2 mg Oral Q12H SCH    Followed by    Melene Muller ON 04/16/2023] dexAMETHasone  2 mg Oral Daily    Followed by    Melene Muller ON 04/18/2023] dexAMETHasone  1 mg Oral Daily    DULoxetine  30 mg Oral BID    enoxaparin (LOVENOX) injection  40 mg Subcutaneous QPM    ezetimibe  10 mg Oral Daily    flu vacc [id number] 6mos up(PF)  0.5 mL Intramuscular During hospitalization    [START ON 04/10/2023] folic acid  1 mg Oral Daily    insulin lispro  0-20 Units Subcutaneous ACHS    insulin lispro  3 Units Subcutaneous 3xd Meals    insulin NPH  4 Units Subcutaneous Daily    levETIRAcetam  500 mg Oral BID    levothyroxine  375 mcg Oral Daily    lisinopril  10 mg Oral Daily    loratadine  10 mg Oral Daily    multivitamins (ADULT)  1 tablet Oral Daily    polyethylene glycol  17 g Oral BID    senna  2 tablet Oral BID    sulfaSALAzine  1,000 mg Oral BID    zinc sulfate  220 mg Oral Daily       Current Outpatient Medications   Medication Instructions    acetaminophen (TYLENOL) 1,000 mg, 2 times a day (standard)    ALLEGRA ALLERGY 180 mg, Daily PRN    blood sugar diagnostic Strp Dispense 100 blood glucose test strips, ok to sub any brand preferred by insurance/patient, use 3x/day; dispense whatever brand matches with meter.    blood-glucose meter kit Use as instructed; dispense 1 meter, whatever is preferred by insurance    BRAFTOVI 450 mg, Oral, Daily (standard)    calcitriol (ROCALTROL) 0.25 mcg, Oral, Daily (standard)    calcium carbonate 1,500 mg (600 mg elem calcium) tablet 2 tablets, Oral, Daily PRN    dexAMETHasone (DECADRON) 6 mg, Oral, Every 12 hours    DULoxetine (CYMBALTA) 30 MG capsule 1 capsule, 2 times a day (standard)    ezetimibe (ZETIA) 10 mg, Oral, Daily (standard) fluticasone propionate (FLONASE) 50 mcg/actuation nasal spray 1 spray, Daily (standard)    folic acid (FOLVITE) 1,000 mcg, Oral, Daily (standard)    lancets Misc Dispense 100 lancets, ok to sub any brand preferred by insurance/patient, use 3x/day    [name] 45 mg, Oral, 2 times a day (standard)    ondansetron (ZOFRAN) 8 mg, Oral, Every 8 hours PRN    sulfaSALAzine (AZULFIDINE) 1,000 mg, Oral, 2 times a day    SYNTHROID 375 mcg, Oral, Daily (standard)    zinc sulfate (ZINCATE) 220 mg, Oral, Daily (standard)  Past Medical History:   Diagnosis Date    Ankylosing spondylitis     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  Thyroidectomy       Past Surgical History:   Procedure Laterality Date    COLON SURGERY      Partial Colectomy    COLONOSCOPY      COSMETIC SURGERY      Related to excision of Cancer.    PR COLONOSCOPY FLX DX W/COLLJ SPEC WHEN PFRMD N/A 08/29/2021    Procedure: COLONOSCOPY, FLEXIBLE, PROXIMAL TO SPLENIC FLEXURE; DIAGNOSTIC, W/WO COLLECTION SPECIMEN BY BRUSH OR WASH;  Surgeon: Luanne Bras, MD;  [facility];  Service: Gastroenterology    PR COLONOSCOPY W/BIOPSY SINGLE/MULTIPLE N/A 01/09/2018    Procedure: COLONOSCOPY, FLEXIBLE, PROXIMAL TO SPLENIC FLEXURE; WITH BIOPSY, SINGLE OR MULTIPLE;  Surgeon: Vonda Antigua, MD;  [facility];  Service: Gastroenterology    PR COLONOSCOPY W/BIOPSY SINGLE/MULTIPLE N/A 08/24/2019 Procedure: COLONOSCOPY, FLEXIBLE, PROXIMAL TO SPLENIC FLEXURE; WITH BIOPSY, SINGLE OR MULTIPLE;  Surgeon: Leland Her, MD;  Location: [facility] PROCEDURES [facility];  Service: Gastroenterology    PR COLONOSCOPY W/BIOPSY SINGLE/MULTIPLE N/A 08/29/2021    Procedure: COLONOSCOPY, FLEXIBLE, PROXIMAL TO SPLENIC FLEXURE; WITH BIOPSY, SINGLE OR MULTIPLE;  Surgeon: Luanne Bras, MD;  Location: [facility];  Service: Gastroenterology    PR COLSC FLX WITH DIRECTED SUBMUCOSAL NJX ANY SBST N/A 08/24/2019    Procedure: COLONOSCOPY, FLEXIBLE, PROXIMAL TO SPLENIC FLEXURE; WITH DIRECTED SUBMUCOSAL INJECTION(S), ANY SUBSTANCE;  Surgeon: Leland Her, MD;  Location: [facility] PROCEDURES [facility];  Service: Gastroenterology    PR EXPLORATORY OF ABDOMEN N/A 01/24/2020    Procedure: EXPLORATORY LAPAROTOMY, EXPLORATORY CELIOTOMY WITH OR WITHOUT BIOPSY(S);  Surgeon: [name], MD;  Location: MAIN OR [place];  Service: Trauma    PR FREEING BOWEL ADHESION,ENTEROLYSIS N/A 01/24/2020    Procedure: ENTEROLYSIS (SEPART PROC);  Surgeon: [name], MD;  Location: MAIN OR [facility];  Service: Trauma    PR IMPLANT MESH HERNIA REPAIR/DEBRIDEMENT CLOSURE N/A 01/03/2015    Procedure: IMPLANTATION OF MESH/OTHER PROSTHES INCISION/VENTRAL HERNIA REPAIR/MESH CLOSE DEBRID NECROT SOFT TIS INFECT;  Surgeon: Romero Belling, MD;  Location: MAIN OR [place];  Service: Gastrointestinal    PR LAP, VENTRAL HERNIA REPAIR,REDUCIBLE N/A 04/07/2014    Procedure: LAPAROSCOPY, SURGICAL, REPAIR, VENTRAL, UMBILICAL, SPIGELIAN OR EPIGASTRIC HERNIA, REDUCIBLE;  Surgeon: Romero Belling, MD;  Location: MAIN OR [place];  Service: Gastrointestinal    PR NEGATIVE PRESSURE WOUND THERAPY DME >50 SQ CM N/A 01/24/2020    Procedure: NEG PRESS WOUND TX (VAC ASSIST) INCL TOPICALS, PER SESSION, TSA GREATER THAN/= 50 CM SQUARED;  Surgeon: [name], MD;  Location: MAIN OR ;  Service: Trauma    PR REMOVAL NODES,  NECK,CERV CMPLT Left 12/07/2012    Procedure: CERVICAL LYMPHADENECTOMY (COMPLETE);  Surgeon: Charlott Rakes, MD;  Location: MAIN OR [facility];  Service: Surgical Oncology    PR REMOVAL NODES, NECK,CERV MOD RAD Right 03/29/2013    Procedure: CERVICAL LYMPHADENECTOMY (MODIFIED RADICAL NECK DISSECTION);  Surgeon: Charlott Rakes, MD;  Location: MAIN OR [facility];  Service: Surgical Oncology    PR REPAIR RECURR INCIS [facility] N/A 01/03/2015    Procedure: REPAIR RECURRENT INCISIONAL OR VENTRAL HERNIA; REDUCIBLE;  Surgeon: Romero Belling, MD;  Location: MAIN OR [facility];  Service: Gastrointestinal    PR REPAIR RECURR INCIS HERNIA,STRANG N/A 06/21/2016    Procedure: REPAIR RECURRENT INCISIONAL OR VENTRAL HERNIA; INCARCERATED OR STRANGULATED;  Surgeon: Mickle Asper, MD;  Location: MAIN OR [facility];  Service: Gastrointestinal    PR REPAIR RECURR INCIS HERNIA,STRANG N/A 01/24/2020    Procedure: REPAIR RECURRENT INCISIONAL OR VENTRAL HERNIA; INCARCERATED OR STRANGULATED;  Surgeon: [name], MD;  Location: MAIN OR [place];  Service: Trauma    PR SIGMOIDOSCOPY,FINE NEEDL BX,US GUIDED N/A 01/09/2018    Procedure: SIGMOIDOSCOPY, FLEXIBLE, W/TRANSENDOSCOPIC ULTRASOUND GUIDED NEEDLE ASPIRATION;  Surgeon: Vonda Antigua, MD;  [facility];  Service: Gastroenterology    THYROID SURGERY         Family History   Problem Relation Age of Onset    Hyperthyroidism Mother     Osteoporosis Mother     Arrhythmia Mother     Squamous cell carcinoma Mother         basal cell vs squamous cell skin cancer    Arthritis Mother     Cancer Mother     Coronary artery disease Father         s/p CABG    Diabetes Father     Hypertension Father     Prostate cancer Father     Angina Father     Cancer Father     Heart disease Father     Colon cancer Paternal Grandmother     Diabetes Paternal Grandmother     Diabetes Maternal Aunt     Diabetes Paternal Uncle     Diabetes Paternal Aunt     Thyroid disease Neg Hx        Social History     Tobacco Use Smoking status: Never     Passive exposure: Never    Smokeless tobacco: Never   Vaping Use    Vaping status: Never Used   Substance Use Topics    Alcohol use: Yes     Comment: social drinker 3 times yearly    Drug use: No       OBJECTIVE:  BP 154/87  - Pulse 85  - Temp 35.8 ??C (96.4 ??F) (Temporal)  - Resp 17  - Ht 188 cm (6' 2)  - Wt (!) 132.4 kg (291 lb 14.2 oz)  - SpO2 97%  - BMI 37.48 kg/m??   Wt Readings from Last 12 Encounters:   04/07/23 (!) 132.4 kg (291 lb 14.2 oz)   03/27/23 (!) 140.3 kg (309 lb 4.9 oz)   03/24/23 (!) 140.3 kg (309 lb 4.9 oz)   02/18/23 (!) 144 kg (317 lb 6.4 oz)   02/12/23 (!) 145.2 kg (320 lb 1.7 oz)   02/04/23 (!) 145.6 kg (321 lb)   01/30/23 (!) 146.1 kg (322 lb)   12/30/22 (!) 143.8 kg (317 lb 1.6 oz)   11/14/22 (!) 144.1 kg (317 lb 9.6 oz)   10/30/22 (!) 145.7 kg (321  lb 3.2 oz)   10/07/22 (!) (P) 143.3 kg (316 lb)   08/30/22 (!) 143.2 kg (315 lb 12.8 oz)     Physical Exam  Nursing note reviewed.   HENT:      Head: Normocephalic.      Comments: Scalp incision c/d/I with staples  Pulmonary:      Effort: Pulmonary effort is normal. No respiratory distress.   Skin:     General: Skin is warm and dry.   Neurological:      Mental Status: He is alert and oriented to person, place, and time.   Psychiatric:         Behavior: Behavior normal.             BG/insulin reviewed per EMR.   Glucose, POC   Date Value   04/09/2023 127 mg/dL   16/10/9602 540 mg/dL   98/11/9145 829 mg/dL (H)   56/21/3086 578 mg/dL (H)   46/96/2952 841 mg/dL (H)   32/44/0102 725 mg/dL   36/64/4034 742 mg/dL   59/56/3875 643 mg/dL (H)   32/95/1884 90 mg/dL   16/60/6301 98 mg/dL   60/10/9321 95 mg/dL   55/73/2202 94 mg/dL   54/27/0623 762 MG/DL   83/15/1761 94 MG/DL   60/73/7106 269 MG/DL        Summary of labs:  Lab Results   Component Value Date    A1C 5.8 (H) 04/06/2023    A1C 5.4 05/10/2020    A1C 5.8 (H) 01/03/2020     Lab Results   Component Value Date    GFR >= 60 03/23/2012    CREATININE 0.75 04/09/2023     Lab Results   Component Value Date    WBC 13.6 (H) 04/08/2023    HGB 12.7 (L) 04/08/2023    HCT 37.2 (L) 04/08/2023    PLT 248 04/08/2023       Lab Results   Component Value Date    NA 135 04/09/2023    K 3.9 04/09/2023    CL 101 04/09/2023    CO2 22.0 04/09/2023    BUN 18 04/09/2023    CREATININE 0.75 04/09/2023    GLU 127 04/09/2023    CALCIUM 7.3 (L) 04/09/2023    MG 2.1 04/08/2023    PHOS 4.4 04/08/2023       Lab Results   Component Value Date    BILITOT 0.3 04/04/2023    BILIDIR 0.20 01/27/2020    PROT 7.1 04/04/2023    ALBUMIN 3.5 04/08/2023    ALT 99 (H) 04/04/2023    AST 67 (H) 04/04/2023    ALKPHOS 138 (H) 04/04/2023    GGT 42 08/22/2011

## 2023-04-09 NOTE — Unmapped (Signed)
 Problem: Wound  Goal: Optimal Coping  Outcome: Progressing  Goal: Optimal Functional Ability  Outcome: Progressing  Goal: Absence of Infection Signs and Symptoms  Outcome: Progressing  Goal: Improved Oral Intake  Outcome: Progressing  Goal: Optimal Pain Control and Function  Outcome: Progressing  Goal: Skin Health and Integrity  Outcome: Progressing  Intervention: Optimize Skin Protection  Recent Flowsheet Documentation  Taken 04/08/2023 2000 by Rayburn Ma, [place], RN  Pressure Reduction Techniques: frequent weight shift encouraged  Pressure Reduction Devices: positioning supports utilized  Skin Protection: adhesive use limited  Goal: Optimal Wound Healing  Outcome: Progressing     Problem: Adult Inpatient Plan of Care  Goal: Plan of Care Review  Outcome: Progressing  Goal: Patient-Specific Goal (Individualized)  Outcome: Progressing  Goal: Absence of Hospital-Acquired Illness or Injury  Outcome: Progressing  Intervention: Prevent Skin Injury  Recent Flowsheet Documentation  Taken 04/08/2023 2000 by Rayburn Ma, [place], RN  Positioning for Skin: Supine/Back  Device Skin Pressure Protection: absorbent pad utilized/changed  Skin Protection: adhesive use limited  Intervention: Prevent and Manage VTE (Venous Thromboembolism) Risk  Recent Flowsheet Documentation  Taken 04/08/2023 2000 by Rayburn Ma, [place], RN  Anti-Embolism Device Type: SCD, Knee  Anti-Embolism Device Status: Refused  Anti-Embolism Device Location: BLE  Goal: Optimal Comfort and Wellbeing  Outcome: Progressing  Goal: Readiness for Transition of Care  Outcome: Progressing  Goal: Rounds/Family Conference  Outcome: Progressing     Problem: Fall Injury Risk  Goal: Absence of Fall and Fall-Related Injury  Outcome: Progressing     Problem: Skin Injury Risk Increased  Goal: Skin Health and Integrity  Outcome: Progressing  Intervention: Optimize Skin Protection  Recent Flowsheet Documentation  Taken 04/08/2023 2000 by Rayburn Ma, [place], RN  Pressure Reduction Techniques: frequent weight shift encouraged  Pressure Reduction Devices: positioning supports utilized  Skin Protection: adhesive use limited     Problem: Self-Care Deficit  Goal: Improved Ability to Complete Activities of Daily Living  Outcome: Progressing

## 2023-04-09 NOTE — Unmapped (Signed)
 Oncology Consults Treatment Plan    Assessment: Kenneth Fitzgerald is a 48 y.o. male with metastatic (M1d) melanoma who was admitted for planned neurosurgical intervention. Oncology was consulted for evaluation of appropriateness of restarting targeted therapy.     [name] has a complicated oncologic history including two primaries, most recently metastatic melanoma. In the metastatic setting, he has received nivolumab x 2c which was complicated by G3 colitis. Additionally, he has been on encorafinib/binimetinib since May 2020, which has been held due to neurosurgical intervention and concern for bleeding (given already existing perilesional hemorrhage). He has also received multiple prior course of radiation to the brain, including whole brain to 30Gy in 10 fx completed 01/20/20 and 2 left frontal lesions and right cingulate gyrus lesion to 20 Gy in 1 fraction completed 05/07/22.    At this point, it would be appropriate to hold his targeted therapy for several reasons. First, he has recently had surgical resection with intraoperative cesium. Second, there may be perioperative risk for bleeding. Third, the pathology, which redemonstrated metastastic melanoma, likely reflecs CNS progression on targeted therapy (encorafinib/binimetinib); as such, it would be worth discussion with his outpatient melanoma specialist whether continuing with targeted therapy or whether switching to other systemic therapy (i.e. immunotherapy in a patient with history of ankylosing spondylitis and prior G3 ICI colitis) would be more appropriate.    Per radiation oncology attending Dr. Mina Marble, at this time, there are no plans for postoperative radiotherapy (beyond the intraoperative Cs-131 GammaTiles). Tempus xT molecular studies were requested on 04/08/23.    Recommendations:   - Hold home encorafinib/binimetinib at this time.  - We have requested Tempus xT testing on resection sample [phone] from 04/04/23. This does not require attention on inpatient basis.  - Dexamethasone per primary team: this is scheduled to taper over two weeks.  - No systemic therapy is anticipated on inpatient basis.  - We have updated Dr. Lendell Caprice and Dr. Tonia Ghent Moschos. He is scheduled to see Dr. Nedra Hai on 04/30/23, and we can make adjustments as appropriate.  - Please notify us of any updates to disposition plan. Currently, he is medically stable pending SNF.    Thank you for this interesting consult. Oncology will sign off at this time. Please do not hesitate to re-consult for requests for clarification, changes in clinical status, or new questions or concerns.    Discussed with Dr. Barbie Haggis.    Cristy Hilts, MD/PhD  Oncology Fellow, PGY-6

## 2023-04-10 MED ADMIN — levETIRAcetam (KEPPRA) tablet 500 mg: 500 mg | ORAL | @ 01:00:00 | Stop: 2023-04-18

## 2023-04-10 MED ADMIN — calcium carbonate (TUMS) chewable tablet 300 mg elem calcium: 300 mg | ORAL | @ 12:00:00

## 2023-04-10 MED ADMIN — levothyroxine (SYNTHROID) tablet 375 mcg: 375 ug | ORAL | @ 09:00:00

## 2023-04-10 MED ADMIN — insulin lispro (HumaLOG) injection 0-20 Units: 0-20 [IU] | SUBCUTANEOUS | @ 02:00:00

## 2023-04-10 MED ADMIN — dexAMETHasone (DECADRON) tablet 2 mg: 2 mg | ORAL | @ 17:00:00 | Stop: 2023-04-11

## 2023-04-10 MED ADMIN — calcitriol (ROCALTROL) capsule 0.25 mcg: .25 ug | ORAL | @ 12:00:00

## 2023-04-10 MED ADMIN — sulfaSALAzine (AZULFIDINE) tablet 1,000 mg: 1000 mg | ORAL | @ 01:00:00

## 2023-04-10 MED ADMIN — bacitracin ointment: TOPICAL | @ 12:00:00 | Stop: 2023-04-12

## 2023-04-10 MED ADMIN — lisinopril (PRINIVIL,ZESTRIL) tablet 10 mg: 10 mg | ORAL | @ 12:00:00

## 2023-04-10 MED ADMIN — DULoxetine (CYMBALTA) DR capsule 30 mg: 30 mg | ORAL | @ 12:00:00

## 2023-04-10 MED ADMIN — levETIRAcetam (KEPPRA) tablet 500 mg: 500 mg | ORAL | @ 12:00:00 | Stop: 2023-04-18

## 2023-04-10 MED ADMIN — insulin lispro (HumaLOG) injection 3 Units: 3 [IU] | SUBCUTANEOUS | @ 12:00:00

## 2023-04-10 MED ADMIN — insulin lispro (HumaLOG) injection 0-20 Units: 0-20 [IU] | SUBCUTANEOUS | @ 22:00:00

## 2023-04-10 MED ADMIN — insulin lispro (HumaLOG) injection 3 Units: 3 [IU] | SUBCUTANEOUS | @ 17:00:00

## 2023-04-10 MED ADMIN — loratadine (CLARITIN) tablet 10 mg: 10 mg | ORAL | @ 12:00:00

## 2023-04-10 MED ADMIN — ascorbic acid (vitamin C) (VITAMIN C) tablet 500 mg: 500 mg | ORAL | @ 01:00:00 | Stop: 2023-04-19

## 2023-04-10 MED ADMIN — dexAMETHasone (DECADRON) tablet 2 mg: 2 mg | ORAL | @ 22:00:00 | Stop: 2023-04-11

## 2023-04-10 MED ADMIN — folic acid (FOLVITE) tablet 1 mg: 1 mg | ORAL | @ 12:00:00

## 2023-04-10 MED ADMIN — insulin NPH (HumuLIN,NovoLIN) injection 4 Units: 4 [IU] | SUBCUTANEOUS | @ 12:00:00

## 2023-04-10 MED ADMIN — dexAMETHasone (DECADRON) tablet 2 mg: 2 mg | ORAL | @ 09:00:00 | Stop: 2023-04-11

## 2023-04-10 MED ADMIN — bacitracin ointment: TOPICAL | @ 01:00:00 | Stop: 2023-04-12

## 2023-04-10 MED ADMIN — dexAMETHasone (DECADRON) tablet 2 mg: 2 mg | ORAL | @ 03:00:00 | Stop: 2023-04-11

## 2023-04-10 MED ADMIN — zinc sulfate (ZINCATE) capsule 220 mg: 220 mg | ORAL | @ 12:00:00

## 2023-04-10 MED ADMIN — insulin lispro (HumaLOG) injection 3 Units: 3 [IU] | SUBCUTANEOUS

## 2023-04-10 MED ADMIN — DULoxetine (CYMBALTA) DR capsule 30 mg: 30 mg | ORAL | @ 01:00:00

## 2023-04-10 MED ADMIN — multivitamins, therapeutic with minerals tablet 1 tablet: 1 | ORAL | @ 12:00:00

## 2023-04-10 MED ADMIN — sulfaSALAzine (AZULFIDINE) tablet 1,000 mg: 1000 mg | ORAL | @ 12:00:00

## 2023-04-10 MED ADMIN — ascorbic acid (vitamin C) (VITAMIN C) tablet 500 mg: 500 mg | ORAL | @ 12:00:00 | Stop: 2023-04-19

## 2023-04-10 MED ADMIN — ezetimibe (ZETIA) tablet 10 mg: 10 mg | ORAL | @ 12:00:00

## 2023-04-10 MED ADMIN — enoxaparin (LOVENOX) syringe 40 mg: 40 mg | SUBCUTANEOUS | @ 22:00:00

## 2023-04-10 MED ADMIN — calcium carbonate (TUMS) chewable tablet 300 mg elem calcium: 300 mg | ORAL | @ 01:00:00

## 2023-04-10 NOTE — Unmapped (Signed)
 Patient is currently admitted in hospital, [place] and Johney [place] is on hold. Will follow up next week.

## 2023-04-10 NOTE — Unmapped (Signed)
 Oncology Consults Treatment Plan    Assessment: Kenneth Fitzgerald is a 48 y.o. 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 For more details, please refer to prior notes dated 04/07/23 and 04/09/23.    Patient may be eligible for Immatics trial as next line therapy, depending on HLA status. HLA order requested and sent with permission of primary team.    Discussed with core lab (HLA group). This is a NGS test. Only HLA-A*02:01 will be reported, but if additional HLA types will be pertinent in the future, that information can be requested.    Cristy Hilts, MD/PhD  Oncology Fellow, PGY-6

## 2023-04-10 NOTE — Unmapped (Signed)
 Vitals remain stable. No neuro changes. Wounds remain clean and intact. No signs of new skin breakdown. Voiding adequately. Completing ADLs independently. Safety precautions in place.     Problem: Wound  Goal: Optimal Coping  Outcome: Progressing  Goal: Optimal Functional Ability  Outcome: Progressing  Goal: Absence of Infection Signs and Symptoms  Outcome: Progressing  Goal: Improved Oral Intake  Outcome: Progressing  Goal: Optimal Pain Control and Function  Outcome: Progressing  Goal: Skin Health and Integrity  Outcome: Progressing  Goal: Optimal Wound Healing  Outcome: Progressing     Problem: Adult Inpatient Plan of Care  Goal: Plan of Care Review  Outcome: Progressing  Goal: Patient-Specific Goal (Individualized)  Outcome: Progressing  Goal: Absence of Hospital-Acquired Illness or Injury  Outcome: Progressing  Intervention: Identify and Manage Fall Risk  Recent Flowsheet Documentation  Taken 04/10/2023 0820 by Donney Dice, RN  Safety Interventions:   fall reduction program maintained   low bed  Goal: Optimal Comfort and Wellbeing  Outcome: Progressing  Goal: Readiness for Transition of Care  Outcome: Progressing  Goal: Rounds/Family Conference  Outcome: Progressing     Problem: Fall Injury Risk  Goal: Absence of Fall and Fall-Related Injury  Outcome: Progressing  Intervention: Promote Injury-Free Environment  Recent Flowsheet Documentation  Taken 04/10/2023 0820 by Donney Dice, RN  Safety Interventions:   fall reduction program maintained   low bed     Problem: Skin Injury Risk Increased  Goal: Skin Health and Integrity  Outcome: Progressing     Problem: Self-Care Deficit  Goal: Improved Ability to Complete Activities of Daily Living  Outcome: Progressing

## 2023-04-10 NOTE — Unmapped (Signed)
 [facility] NEUROSURGERY   INPATIENT PROGRESS NOTE      Brief History of Present Illness  Kenneth Fitzgerald is a 48 y.o. male with metastatic melanoma who is now sp R craniotomy for resection (3/21)    Subjective/Interval History  Medically ready for SNF    Interval Imaging Reviewed  none    Neurological Assessment and Plan  **Metastasis to brain  POD7 R craniotomy  - Na>135  - SBP<160  - dexamethasone taper over 2 weeks   - post op MRI and CT completed  - gamma tiles trial  - keppra 500mg  bidx14d  - follow up path, frozen consistent with metastatic melanoma   - oncology following for initiation of braf/mek inhibitors    - Holding per oncology  - PT/OT  - ok to gently wash head    Anticoagulant/Antiplatelet needs: None    Disposition: floor, stable for dispo    ___________________________________________________________________    Neurological Exam  EOSp  AOX3  PERRL  EOMI  FS  TML  RUE/RLE 5  LUE/LLE 5  SILT  No drift  Incision cdi staples      The patient's vitals, intake/output, labs, orders, and relevant imaging were reviewed for the last 24 hours.    Problem List  Principal Problem:    Metastasis to brain
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
## 2023-04-11 LAB — BASIC METABOLIC PANEL
ANION GAP: 11 mmol/L (ref 5–14)
BLOOD UREA NITROGEN: 19 mg/dL (ref 9–23)
BUN / CREAT RATIO: 28
CALCIUM: 7.1 mg/dL — ABNORMAL LOW (ref 8.7–10.4)
CHLORIDE: 101 mmol/L (ref 98–107)
CO2: 25 mmol/L (ref 20.0–31.0)
CREATININE: 0.69 mg/dL — ABNORMAL LOW (ref 0.73–1.18)
EGFR CKD-EPI (2021) MALE: >90 mL/min/1.73m2 (ref >=60)
GLUCOSE RANDOM: 134 mg/dL (ref 70–179)
POTASSIUM: 4.3 mmol/L (ref 3.4–4.8)
SODIUM: 137 mmol/L (ref 135–145)

## 2023-04-11 MED ADMIN — oxyCODONE (ROXICODONE) immediate release tablet 10 mg: 10 mg | ORAL | @ 01:00:00 | Stop: 2023-04-12

## 2023-04-11 MED ADMIN — dexAMETHasone (DECADRON) tablet 2 mg: 2 mg | ORAL | @ 17:00:00 | Stop: 2023-04-11

## 2023-04-11 MED ADMIN — lisinopril (PRINIVIL,ZESTRIL) tablet 10 mg: 10 mg | ORAL | @ 13:00:00

## 2023-04-11 MED ADMIN — ezetimibe (ZETIA) tablet 10 mg: 10 mg | ORAL | @ 13:00:00

## 2023-04-11 MED ADMIN — multivitamins, therapeutic with minerals tablet 1 tablet: 1 | ORAL | @ 13:00:00

## 2023-04-11 MED ADMIN — levETIRAcetam (KEPPRA) tablet 500 mg: 500 mg | ORAL | @ 01:00:00 | Stop: 2023-04-18

## 2023-04-11 MED ADMIN — calcitriol (ROCALTROL) capsule 0.25 mcg: .25 ug | ORAL | @ 13:00:00

## 2023-04-11 MED ADMIN — levETIRAcetam (KEPPRA) tablet 500 mg: 500 mg | ORAL | @ 13:00:00 | Stop: 2023-04-18

## 2023-04-11 MED ADMIN — acetaminophen (TYLENOL) tablet 650 mg: 650 mg | ORAL | @ 01:00:00

## 2023-04-11 MED ADMIN — ascorbic acid (vitamin C) (VITAMIN C) tablet 500 mg: 500 mg | ORAL | @ 01:00:00 | Stop: 2023-04-19

## 2023-04-11 MED ADMIN — folic acid (FOLVITE) tablet 1 mg: 1 mg | ORAL | @ 13:00:00

## 2023-04-11 MED ADMIN — loratadine (CLARITIN) tablet 10 mg: 10 mg | ORAL | @ 13:00:00

## 2023-04-11 MED ADMIN — insulin lispro (HumaLOG) injection 0-20 Units: 0-20 [IU] | SUBCUTANEOUS | @ 21:00:00

## 2023-04-11 MED ADMIN — dexAMETHasone (DECADRON) tablet 2 mg: 2 mg | ORAL | @ 10:00:00 | Stop: 2023-04-11

## 2023-04-11 MED ADMIN — dexAMETHasone (DECADRON) tablet 2 mg: 2 mg | ORAL | @ 05:00:00 | Stop: 2023-04-11

## 2023-04-11 MED ADMIN — calcium carbonate (TUMS) chewable tablet 300 mg elem calcium: 300 mg | ORAL | @ 17:00:00

## 2023-04-11 MED ADMIN — dexAMETHasone (DECADRON) tablet 2 mg: 2 mg | ORAL | @ 21:00:00 | Stop: 2023-04-13

## 2023-04-11 MED ADMIN — ascorbic acid (vitamin C) (VITAMIN C) tablet 500 mg: 500 mg | ORAL | @ 13:00:00 | Stop: 2023-04-19

## 2023-04-11 MED ADMIN — calcium carbonate (TUMS) chewable tablet 300 mg elem calcium: 300 mg | ORAL | @ 13:00:00 | Stop: 2023-04-11

## 2023-04-11 MED ADMIN — DULoxetine (CYMBALTA) DR capsule 30 mg: 30 mg | ORAL | @ 01:00:00

## 2023-04-11 MED ADMIN — insulin lispro (HumaLOG) injection 0-20 Units: 0-20 [IU] | SUBCUTANEOUS | @ 02:00:00

## 2023-04-11 MED ADMIN — levothyroxine (SYNTHROID) tablet 375 mcg: 375 ug | ORAL | @ 10:00:00

## 2023-04-11 MED ADMIN — bacitracin ointment: TOPICAL | @ 13:00:00 | Stop: 2023-04-12

## 2023-04-11 MED ADMIN — bacitracin ointment: TOPICAL | @ 01:00:00 | Stop: 2023-04-12

## 2023-04-11 MED ADMIN — sulfaSALAzine (AZULFIDINE) tablet 1,000 mg: 1000 mg | ORAL | @ 13:00:00

## 2023-04-11 MED ADMIN — DULoxetine (CYMBALTA) DR capsule 30 mg: 30 mg | ORAL | @ 13:00:00

## 2023-04-11 MED ADMIN — calcium carbonate (TUMS) chewable tablet 300 mg elem calcium: 300 mg | ORAL | @ 21:00:00

## 2023-04-11 MED ADMIN — insulin NPH (HumuLIN,NovoLIN) injection 4 Units: 4 [IU] | SUBCUTANEOUS | @ 13:00:00

## 2023-04-11 MED ADMIN — zinc sulfate (ZINCATE) capsule 220 mg: 220 mg | ORAL | @ 13:00:00

## 2023-04-11 MED ADMIN — insulin lispro (HumaLOG) injection 3 Units: 3 [IU] | SUBCUTANEOUS | @ 13:00:00

## 2023-04-11 MED ADMIN — enoxaparin (LOVENOX) syringe 40 mg: 40 mg | SUBCUTANEOUS | @ 21:00:00

## 2023-04-11 MED ADMIN — calcium carbonate (TUMS) chewable tablet 300 mg elem calcium: 300 mg | ORAL | @ 01:00:00

## 2023-04-11 MED ADMIN — sulfaSALAzine (AZULFIDINE) tablet 1,000 mg: 1000 mg | ORAL | @ 01:00:00

## 2023-04-11 NOTE — Unmapped (Addendum)
 Endocrine Team Diabetes Follow Up   Consult Note     Consult information:  Requesting Attending Physician : Arman Filter, MD  Service Requesting Consult : Neurosurgery [facility])  Primary Care Provider: Rosine Beat, Benison Pap, DO  Impression:  Kenneth Fitzgerald is a 48 y.o. male admitted for R craniotomy s/p resection of brain mass. We have been consulted at the request of Arman Filter, MD to evaluate Kenneth Fitzgerald for hyperglycemia.     Medical Decision Making:  Diagnoses:  1. Prediabetes/Medication induced hyperglycemia. Uncontrolled   With hyperglycemia.  2. Nutrition: Complicating glycemic control. Increasing risk for both hypoglycemia and hyperglycemia.  3. Steroids. Complicating glycemic control and increasing risk for hyperglycemia.  4. Obesity. Complicating glycemic control and increasing risk for hyperglycemia.  5. Hypothyroidism  6. 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 Normal potassium. Intermittent hyperglycemia. No kidney dysfunction noted. Calcium low at 7.1 today  Notes reviewed: Primary team and nursing notes      Overall impression based on above reviews and history:  1. Prediabetes/ Medication induced hyperglycemia: Steroid dose reduced to dexamethasone 2mg  q8hrs today. Patient noted to have one BG above goal over the last 24 hours. Improved with correction. Given only one BG excursion, will not make any changes to insulin regimen at this time. Will continue to monitor closely     Recommendations:  - NPH 4u in AM  - Lispro 3u TIDAC  - Lispro 1:30>140 ACHS  - Hypoglycemia protocol.  - POCT-BG achs.  - Ensure patient is on glucose precautions if patient taking nutrition by mouth.     2. Hypothyroidism: well-managed on levothyroxine outpatient. No clinical findings suggestive of hypothyroidism at this time. Recommend continuing home dose. TSH normal when checked on 9/23 and FT4 found to be LLN. Followed by outpatient endocrinologist and plan to repeat in 1-2 months.   - Continue levothyroxine daily    3. 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 Given low calcium with normal albumin at admission, 300mg  of elemental calcium added BID. Calcium level still persistently low. Patient with negative chvostek sign on exam. Given inadequate calcium intake via diet, will increase to 300mg  elemental calcium TIDAC. Reminded patient to still supplement with calcium in meals throughout the day.     - Start Calcium carbonate 750mg  TIDAC  - calcitriol 0.55mcg daily  - Calcium levels BID    Discharge planning:  In process. Will complete closer to discharge.Anticipate being able to discharge off antihyperglycemic agents.     Thank you for this consult. Discussed plan with primary team. We will continue to follow and make recommendations and place orders as appropriate.    Please page with questions or concerns: Marisue Humble, [place]: [phone]  Endocrinology Diabetes Care Team on call from 6AM - 3PM on weekdays then endocrine fellow on call: [phone] from 3PM - 6AM on weekdays and on weekends and holidays.   If APP cannot be reached, please page the endocrine fellow on call.      Subjective:    Interval History:  Patient states he is doing well. Does report he is does not believe he is getting enough dietary calcium at hospital as he normally drinks a lot of milk at home which he is not doing here. Denies any nausea or vomiting    Initial HPI:  Kenneth Fitzgerald  is a 48 y.o. male with past medical history of preDM, thyroid cancer, hypothyroidism, hypoparathyroidism, admitted for metastatic melanoma who is now sp R craniotomy for resection (3/21)     Diabetes History:  Patient has history of Prediabetes but is not on any medications at home     Patient is seen by Dr. Lyda Perone at [facility] endocrinology outpatient      Current Nutrition:  Active Orders   Diet    Nutrition Therapy Regular/House       ROS: As per HPI.     ascorbic acid (vitamin C)  500 mg Oral BID    bacitracin   Topical BID    calcitriol  0.25 mcg Oral Daily    calcium carbonate  300 mg elem calcium Oral 3xd Meals    dexAMETHasone  2 mg Oral Q8H SCH    Followed by    Melene Muller ON 04/13/2023] dexAMETHasone  2 mg Oral Q12H SCH    Followed by    Melene Muller ON 04/16/2023] dexAMETHasone  2 mg Oral Daily    Followed by    Melene Muller ON 04/18/2023] dexAMETHasone  1 mg Oral Daily    DULoxetine  30 mg Oral BID    enoxaparin (LOVENOX) injection  40 mg Subcutaneous QPM    ezetimibe  10 mg Oral Daily    flu vacc [id number] 6mos up(PF)  0.5 mL Intramuscular During hospitalization    folic acid  1 mg Oral Daily    insulin lispro  0-20 Units Subcutaneous ACHS    insulin lispro  3 Units Subcutaneous 3xd Meals    insulin NPH  4 Units Subcutaneous Daily    levETIRAcetam  500 mg Oral BID    levothyroxine  375 mcg Oral Daily    lisinopril  10 mg Oral Daily    loratadine  10 mg Oral Daily    multivitamins (ADULT)  1 tablet Oral Daily    polyethylene glycol  17 g Oral BID    senna  2 tablet Oral BID    sulfaSALAzine  1,000 mg Oral BID    zinc sulfate  220 mg Oral Daily       Current Outpatient Medications   Medication Instructions    acetaminophen (TYLENOL) 1,000 mg, 2 times a day (standard)    ALLEGRA ALLERGY 180 mg, Daily PRN    blood sugar diagnostic Strp Dispense 100 blood glucose test strips, ok to sub any brand preferred by insurance/patient, use 3x/day; dispense whatever brand matches with meter.    blood-glucose meter kit Use as instructed; dispense 1 meter, whatever is preferred by insurance    BRAFTOVI 450 mg, Oral, Daily (standard)    calcitriol (ROCALTROL) 0.25 mcg, Oral, Daily (standard)    calcium carbonate 1,500 mg (600 mg elem calcium) tablet 2 tablets, Oral, Daily PRN    dexAMETHasone (DECADRON) 6 mg, Oral, Every 12 hours    DULoxetine (CYMBALTA) 30 MG capsule 1 capsule, 2 times a day (standard)    ezetimibe (ZETIA) 10 mg, Oral, Daily (standard)    fluticasone propionate (FLONASE) 50 mcg/actuation nasal spray 1 spray, Daily (standard)    folic acid (FOLVITE) 1,000 mcg, Oral, Daily (standard)    lancets Misc Dispense 100 lancets, ok to sub any brand preferred by insurance/patient, use 3x/day    [name] 45 mg, Oral, 2 times a day (standard)    ondansetron (ZOFRAN) 8 mg, Oral, Every 8 hours PRN    sulfaSALAzine (AZULFIDINE) 1,000 mg, Oral, 2 times a day    SYNTHROID 375  mcg, Oral, Daily (standard)    zinc sulfate (ZINCATE) 220 mg, Oral, Daily (standard)           Past Medical History:   Diagnosis Date    Ankylosing spondylitis     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  Thyroidectomy       Past Surgical History:   Procedure Laterality Date    COLON SURGERY      Partial Colectomy    COLONOSCOPY      COSMETIC SURGERY      Related to excision of Cancer.    PR COLONOSCOPY FLX DX W/COLLJ SPEC WHEN PFRMD N/A 08/29/2021    Procedure: COLONOSCOPY, FLEXIBLE, PROXIMAL TO SPLENIC FLEXURE; DIAGNOSTIC, W/WO COLLECTION SPECIMEN BY BRUSH OR WASH;  Surgeon: Luanne Bras, MD;  [facility];  Service: Gastroenterology    PR COLONOSCOPY W/BIOPSY SINGLE/MULTIPLE N/A 01/09/2018    Procedure: COLONOSCOPY, FLEXIBLE, PROXIMAL TO SPLENIC FLEXURE; WITH BIOPSY, SINGLE OR MULTIPLE;  Surgeon: Vonda Antigua, MD;  [facility]: [facility];  Service: Gastroenterology PR COLONOSCOPY W/BIOPSY SINGLE/MULTIPLE N/A 08/24/2019    Procedure: COLONOSCOPY, FLEXIBLE, PROXIMAL TO SPLENIC FLEXURE; WITH BIOPSY, SINGLE OR MULTIPLE;  Surgeon: Leland Her, MD;  Location: GI PROCEDURES [facility] [facility];  Service: Gastroenterology    PR COLONOSCOPY W/BIOPSY SINGLE/MULTIPLE N/A 08/29/2021    Procedure: COLONOSCOPY, FLEXIBLE, PROXIMAL TO SPLENIC FLEXURE; WITH BIOPSY, SINGLE OR MULTIPLE;  Surgeon: Luanne Bras, MD;  Location: [facility];  Service: Gastroenterology    PR COLSC FLX WITH DIRECTED SUBMUCOSAL NJX ANY SBST N/A 08/24/2019    Procedure: COLONOSCOPY, FLEXIBLE, PROXIMAL TO SPLENIC FLEXURE; WITH DIRECTED SUBMUCOSAL INJECTION(S), ANY SUBSTANCE;  Surgeon: Leland Her, MD;  Location: GI PROCEDURES [place] [facility];  Service: Gastroenterology    PR EXPLORATORY OF ABDOMEN N/A 01/24/2020    Procedure: EXPLORATORY LAPAROTOMY, EXPLORATORY CELIOTOMY WITH OR WITHOUT BIOPSY(S);  Surgeon: [name], MD;  Location: MAIN OR [place];  Service: Trauma    PR FREEING BOWEL ADHESION,ENTEROLYSIS N/A 01/24/2020    Procedure: ENTEROLYSIS (SEPART PROC);  Surgeon: [name], MD;  Location: MAIN OR [facility];  Service: Trauma    PR IMPLANT MESH HERNIA REPAIR/DEBRIDEMENT CLOSURE N/A 01/03/2015    Procedure: IMPLANTATION OF MESH/OTHER PROSTHES INCISION/VENTRAL HERNIA REPAIR/MESH CLOSE DEBRID NECROT SOFT TIS INFECT;  Surgeon: Romero Belling, MD;  Location: MAIN OR [place];  Service: Gastrointestinal    PR LAP, VENTRAL HERNIA REPAIR,REDUCIBLE N/A 04/07/2014    Procedure: LAPAROSCOPY, SURGICAL, REPAIR, VENTRAL, UMBILICAL, SPIGELIAN OR EPIGASTRIC HERNIA, REDUCIBLE;  Surgeon: Romero Belling, MD;  Location: MAIN OR ;  Service: Gastrointestinal    PR NEGATIVE PRESSURE WOUND THERAPY DME >50 SQ CM N/A 01/24/2020    Procedure: NEG PRESS WOUND TX (VAC ASSIST) INCL TOPICALS, PER SESSION, TSA  GREATER THAN/= 50 CM SQUARED;  Surgeon: [name], MD;  Location: MAIN OR [place]; Service: Trauma    PR REMOVAL NODES, NECK,CERV CMPLT Left 12/07/2012    Procedure: CERVICAL LYMPHADENECTOMY (COMPLETE);  Surgeon: Charlott Rakes, MD;  Location: MAIN OR [facility];  Service: Surgical Oncology    PR REMOVAL NODES, NECK,CERV MOD RAD Right 03/29/2013    Procedure: CERVICAL LYMPHADENECTOMY (MODIFIED RADICAL NECK DISSECTION);  Surgeon: Charlott Rakes, MD;  Location: MAIN OR [facility];  Service: Surgical Oncology    PR REPAIR RECURR INCIS [facility] N/A 01/03/2015    Procedure: REPAIR RECURRENT INCISIONAL OR VENTRAL HERNIA; REDUCIBLE;  Surgeon: Romero Belling, MD;  Location: MAIN OR [facility];  Service: Gastrointestinal    PR REPAIR RECURR INCIS HERNIA,STRANG N/A 06/21/2016    Procedure: REPAIR RECURRENT INCISIONAL OR VENTRAL HERNIA; INCARCERATED OR STRANGULATED;  Surgeon: Mickle Asper, MD;  Location: MAIN OR [facility];  Service: Gastrointestinal    PR REPAIR RECURR INCIS HERNIA,STRANG N/A 01/24/2020    Procedure: REPAIR RECURRENT INCISIONAL OR VENTRAL HERNIA; INCARCERATED OR STRANGULATED;  Surgeon: [name], MD;  Location: MAIN OR [place];  Service: Trauma    PR SIGMOIDOSCOPY,FINE NEEDL BX,US GUIDED N/A 01/09/2018    Procedure: SIGMOIDOSCOPY, FLEXIBLE, W/TRANSENDOSCOPIC ULTRASOUND GUIDED NEEDLE ASPIRATION;  Surgeon: Vonda Antigua, MD;  Location: [facility];  Service: Gastroenterology    THYROID SURGERY         Family History   Problem Relation Age of Onset    Hyperthyroidism Mother     Osteoporosis Mother     Arrhythmia Mother     Squamous cell carcinoma Mother         basal cell vs squamous cell skin cancer    Arthritis Mother     Cancer Mother     Coronary artery disease Father         s/p CABG    Diabetes Father     Hypertension Father     Prostate cancer Father     Angina Father     Cancer Father     Heart disease Father     Colon cancer Paternal Grandmother     Diabetes Paternal Grandmother     Diabetes Maternal Aunt     Diabetes Paternal Uncle     Diabetes Paternal Aunt Thyroid disease Neg Hx        Social History     Tobacco Use    Smoking status: Never     Passive exposure: Never    Smokeless tobacco: Never   Vaping Use    Vaping status: Never Used   Substance Use Topics    Alcohol use: Yes     Comment: social drinker 3 times yearly    Drug use: No       OBJECTIVE:  BP 148/89  - Pulse 67  - Temp 36.2 ??C (97.2 ??F) (Temporal)  - Resp 18  - Ht 188 cm (6' 2)  - Wt (!) 132.4 kg (291 lb 14.2 oz)  - SpO2 98%  - BMI 37.48 kg/m??   Wt Readings from Last 12 Encounters:   04/07/23 (!) 132.4 kg (291 lb 14.2 oz)   03/27/23 (!) 140.3 kg (309 lb 4.9 oz)   03/24/23 (!) 140.3 kg (309 lb 4.9 oz)   02/18/23 (!) 144 kg (317 lb 6.4 oz)   02/12/23 (!) 145.2 kg (320 lb 1.7 oz)   02/04/23 (!) 145.6 kg (321 lb)   01/30/23 (!) 146.1 kg (322 lb)   12/30/22 Marland Kitchen)  143.8 kg (317 lb 1.6 oz)   11/14/22 (!) 144.1 kg (317 lb 9.6 oz)   10/30/22 (!) 145.7 kg (321 lb 3.2 oz)   10/07/22 (!) (P) 143.3 kg (316 lb)   08/30/22 (!) 143.2 kg (315 lb 12.8 oz)     Physical Exam  Nursing note reviewed.   HENT:      Head: Normocephalic.      Comments: Scalp incision c/d/I with staples  Chvostek sign negative  Eyes:      General: No scleral icterus.     Conjunctiva/sclera: Conjunctivae normal.   Pulmonary:      Effort: Pulmonary effort is normal. No respiratory distress.   Skin:     General: Skin is warm and dry.   Neurological:      Mental Status: He is alert and oriented to person, place, and time.   Psychiatric:         Behavior: Behavior normal.             BG/insulin reviewed per EMR.   Glucose, POC   Date Value   04/11/2023 131 mg/dL   96/29/5284 132 mg/dL   44/01/270 536 mg/dL (H)   64/40/3474 259 mg/dL   56/38/7564 332 mg/dL   95/18/8416 606 mg/dL   30/16/0109 323 mg/dL (H)   55/73/2202 542 mg/dL (H)   70/62/3762 90 mg/dL   83/15/1761 98 mg/dL   60/73/7106 95 mg/dL   26/94/8546 94 mg/dL   27/03/5007 381 MG/DL   82/99/3716 94 MG/DL   96/78/9381 017 MG/DL        Summary of labs:  Lab Results   Component Value Date    A1C 5.8 (H) 04/06/2023    A1C 5.4 05/10/2020    A1C 5.8 (H) 01/03/2020     Lab Results   Component Value Date    GFR >= 60 03/23/2012    CREATININE 0.69 (L) 04/11/2023     Lab Results   Component Value Date    WBC 13.6 (H) 04/08/2023    HGB 12.7 (L) 04/08/2023    HCT 37.2 (L) 04/08/2023    PLT 248 04/08/2023       Lab Results   Component Value Date    NA 137 04/11/2023    K 4.3 04/11/2023    CL 101 04/11/2023    CO2 25.0 04/11/2023    BUN 19 04/11/2023    CREATININE 0.69 (L) 04/11/2023    GLU 134 04/11/2023    CALCIUM 7.1 (L) 04/11/2023    MG 2.1 04/08/2023    PHOS 4.4 04/08/2023       Lab Results   Component Value Date    BILITOT 0.3 04/04/2023    BILIDIR 0.20 01/27/2020    PROT 7.1 04/04/2023    ALBUMIN 3.5 04/08/2023    ALT 99 (H) 04/04/2023    AST 67 (H) 04/04/2023    ALKPHOS 138 (H) 04/04/2023    GGT 42 08/22/2011

## 2023-04-11 NOTE — Unmapped (Signed)
 Problem: Wound  Goal: Optimal Coping  Outcome: Progressing  Goal: Optimal Functional Ability  Outcome: Progressing  Intervention: Optimize Functional Ability  Recent Flowsheet Documentation  Taken 04/10/2023 2000 by Rayburn Ma, [place], RN  Activity Management: up ad lib  Goal: Absence of Infection Signs and Symptoms  Outcome: Progressing  Intervention: Prevent or Manage Infection  Recent Flowsheet Documentation  Taken 04/10/2023 2000 by Rayburn Ma, [place], RN  Infection Management: aseptic technique maintained  Goal: Improved Oral Intake  Outcome: Progressing  Goal: Optimal Pain Control and Function  Outcome: Progressing  Goal: Skin Health and Integrity  Outcome: Progressing  Intervention: Optimize Skin Protection  Recent Flowsheet Documentation  Taken 04/10/2023 2000 by Rayburn Ma, [place], RN  Activity Management: up ad lib  Pressure Reduction Techniques: frequent weight shift encouraged  Head of Bed (HOB) Positioning: HOB at 30 degrees  Pressure Reduction Devices: positioning supports utilized  Skin Protection: adhesive use limited  Goal: Optimal Wound Healing  Outcome: Progressing     Problem: Adult Inpatient Plan of Care  Goal: Plan of Care Review  Outcome: Progressing  Goal: Patient-Specific Goal (Individualized)  Outcome: Progressing  Goal: Absence of Hospital-Acquired Illness or Injury  Outcome: Progressing  Intervention: Identify and Manage Fall Risk  Recent Flowsheet Documentation  Taken 04/10/2023 2000 by Rayburn Ma, [place], RN  Safety Interventions:   fall reduction program maintained   lighting adjusted for tasks/safety   low bed   nonskid shoes/slippers when out of bed  Intervention: Prevent Skin Injury  Recent Flowsheet Documentation  Taken 04/10/2023 2000 by Rayburn Ma, [place], RN  Positioning for Skin: Supine/Back  Device Skin Pressure Protection: absorbent pad utilized/changed  Skin Protection: adhesive use limited  Intervention: Prevent and Manage VTE (Venous Thromboembolism) Risk  Recent Flowsheet Documentation  Taken 04/10/2023 2200 by Rayburn Ma, [place], RN  Anti-Embolism Device Type: SCD, Knee  Anti-Embolism Device Status: Refused  Anti-Embolism Device Location: BLE  Taken 04/10/2023 2000 by Rayburn Ma, [place], RN  Anti-Embolism Device Type: SCD, Knee  Anti-Embolism Device Status: Refused  Anti-Embolism Device Location: BLE  Intervention: Prevent Infection  Recent Flowsheet Documentation  Taken 04/10/2023 2000 by Rayburn Ma, [place], RN  Infection Prevention: cohorting utilized  Goal: Optimal Comfort and Wellbeing  Outcome: Progressing  Goal: Readiness for Transition of Care  Outcome: Progressing  Goal: Rounds/Family Conference  Outcome: Progressing     Problem: Fall Injury Risk  Goal: Absence of Fall and Fall-Related Injury  Outcome: Progressing  Intervention: Promote Scientist, clinical (histocompatibility and immunogenetics) Documentation  Taken 04/10/2023 2000 by Rayburn Ma, [place], RN  Safety Interventions:   fall reduction program maintained   lighting adjusted for tasks/safety   low bed   nonskid shoes/slippers when out of bed     Problem: Skin Injury Risk Increased  Goal: Skin Health and Integrity  Outcome: Progressing  Intervention: Optimize Skin Protection  Recent Flowsheet Documentation  Taken 04/10/2023 2000 by Rayburn Ma, , RN  Activity Management: up ad lib  Pressure Reduction Techniques: frequent weight shift encouraged  Head of Bed (HOB) Positioning: HOB at 30 degrees  Pressure Reduction Devices: positioning supports utilized  Skin Protection: adhesive use limited     Problem: Self-Care Deficit  Goal: Improved Ability to Complete Activities of Daily Living  Outcome: Progressing

## 2023-04-11 NOTE — Unmapped (Signed)
 Vitals remain stable. No neuro changes. Ambulating in halls. Wounds remain clean and intact. No signs of new skin breakdown. Voiding adequately. Completing ADLs independently. Safety precautions in place.     Problem: Wound  Goal: Optimal Coping  Outcome: Progressing  Goal: Optimal Functional Ability  Outcome: Progressing  Goal: Absence of Infection Signs and Symptoms  Outcome: Progressing  Goal: Improved Oral Intake  Outcome: Progressing  Goal: Optimal Pain Control and Function  Outcome: Progressing  Goal: Skin Health and Integrity  Outcome: Progressing  Goal: Optimal Wound Healing  Outcome: Progressing     Problem: Adult Inpatient Plan of Care  Goal: Plan of Care Review  Outcome: Progressing  Goal: Patient-Specific Goal (Individualized)  Outcome: Progressing  Goal: Absence of Hospital-Acquired Illness or Injury  Outcome: Progressing  Intervention: Identify and Manage Fall Risk  Recent Flowsheet Documentation  Taken 04/11/2023 0835 by Donney Dice, RN  Safety Interventions:   fall reduction program maintained   low bed  Goal: Optimal Comfort and Wellbeing  Outcome: Progressing  Goal: Readiness for Transition of Care  Outcome: Progressing  Goal: Rounds/Family Conference  Outcome: Progressing     Problem: Fall Injury Risk  Goal: Absence of Fall and Fall-Related Injury  Outcome: Progressing  Intervention: Promote Scientist, clinical (histocompatibility and immunogenetics) Documentation  Taken 04/11/2023 0835 by Donney Dice, RN  Safety Interventions:   fall reduction program maintained   low bed     Problem: Skin Injury Risk Increased  Goal: Skin Health and Integrity  Outcome: Progressing     Problem: Self-Care Deficit  Goal: Improved Ability to Complete Activities of Daily Living  Outcome: Progressing

## 2023-04-11 NOTE — Unmapped (Signed)
 [facility]   INPATIENT PROGRESS NOTE      Brief History of Present Illness  Kenneth Fitzgerald is a 48 y.o. male with metastatic melanoma who is now sp R craniotomy for resection (3/21)    Subjective/Interval History  Medically ready for SNF    Interval Imaging Reviewed  none    Neurological Assessment and Plan  **Metastasis to brain  POD8 R craniotomy  - Na>135  - SBP<160  - dexamethasone taper over 2 weeks   - post op MRI and CT completed  - gamma tiles trial  - keppra 500mg  bidx14d  - follow up path, frozen consistent with metastatic melanoma   - oncology following for initiation of braf/mek inhibitors    - Holding per oncology  - PT/OT  - ok to gently wash head    Anticoagulant/Antiplatelet needs: None    Disposition: floor, stable for dispo    ___________________________________________________________________    Neurological Exam  EOSp  AOX3  PERRL  EOMI  FS  TML  RUE/RLE 5  LUE/LLE 5  SILT  No drift  Incision cdi staples      The patient's vitals, intake/output, labs, orders, and relevant imaging were reviewed for the last 24 hours.    Problem List  Principal Problem:    Metastasis to brain

## 2023-04-12 LAB — BASIC METABOLIC PANEL
ANION GAP: 11 mmol/L (ref 5–14)
BLOOD UREA NITROGEN: 17 mg/dL (ref 9–23)
BUN / CREAT RATIO: 28
CALCIUM: 6.7 mg/dL — ABNORMAL LOW (ref 8.7–10.4)
CHLORIDE: 101 mmol/L (ref 98–107)
CO2: 25 mmol/L (ref 20.0–31.0)
CREATININE: 0.6 mg/dL — ABNORMAL LOW (ref 0.73–1.18)
EGFR CKD-EPI (2021) MALE: >90 mL/min/1.73m2 (ref >=60)
GLUCOSE RANDOM: 116 mg/dL (ref 70–179)
POTASSIUM: 4 mmol/L (ref 3.4–4.8)
SODIUM: 137 mmol/L (ref 135–145)

## 2023-04-12 LAB — ALBUMIN: ALBUMIN: 3.2 g/dL — ABNORMAL LOW (ref 3.4–5.0)

## 2023-04-12 LAB — PHOSPHORUS: PHOSPHORUS: 5.3 mg/dL — ABNORMAL HIGH (ref 2.4–5.1)

## 2023-04-12 MED ADMIN — oxyCODONE (ROXICODONE) immediate release tablet 10 mg: 10 mg | ORAL | @ 15:00:00 | Stop: 2023-04-19

## 2023-04-12 MED ADMIN — dexAMETHasone (DECADRON) tablet 2 mg: 2 mg | ORAL | @ 10:00:00 | Stop: 2023-04-13

## 2023-04-12 MED ADMIN — insulin lispro (HumaLOG) injection 3 Units: 3 [IU] | SUBCUTANEOUS | @ 12:00:00

## 2023-04-12 MED ADMIN — sulfaSALAzine (AZULFIDINE) tablet 1,000 mg: 1000 mg | ORAL | @ 01:00:00

## 2023-04-12 MED ADMIN — ascorbic acid (vitamin C) (VITAMIN C) tablet 500 mg: 500 mg | ORAL | @ 01:00:00 | Stop: 2023-04-19

## 2023-04-12 MED ADMIN — lisinopril (PRINIVIL,ZESTRIL) tablet 10 mg: 10 mg | ORAL | @ 12:00:00

## 2023-04-12 MED ADMIN — calcitriol (ROCALTROL) capsule 0.25 mcg: .25 ug | ORAL | @ 12:00:00

## 2023-04-12 MED ADMIN — levETIRAcetam (KEPPRA) tablet 500 mg: 500 mg | ORAL | @ 12:00:00 | Stop: 2023-04-18

## 2023-04-12 MED ADMIN — DULoxetine (CYMBALTA) DR capsule 30 mg: 30 mg | ORAL | @ 12:00:00

## 2023-04-12 MED ADMIN — enoxaparin (LOVENOX) syringe 40 mg: 40 mg | SUBCUTANEOUS | @ 22:00:00

## 2023-04-12 MED ADMIN — DULoxetine (CYMBALTA) DR capsule 30 mg: 30 mg | ORAL | @ 01:00:00

## 2023-04-12 MED ADMIN — senna (SENOKOT) tablet 2 tablet: 2 | ORAL | @ 12:00:00

## 2023-04-12 MED ADMIN — folic acid (FOLVITE) tablet 1 mg: 1 mg | ORAL | @ 12:00:00

## 2023-04-12 MED ADMIN — calcium carbonate (TUMS) chewable tablet 400 mg elem calcium: 400 mg | ORAL | @ 22:00:00

## 2023-04-12 MED ADMIN — ezetimibe (ZETIA) tablet 10 mg: 10 mg | ORAL | @ 12:00:00

## 2023-04-12 MED ADMIN — zinc sulfate (ZINCATE) capsule 220 mg: 220 mg | ORAL | @ 12:00:00

## 2023-04-12 MED ADMIN — sulfaSALAzine (AZULFIDINE) tablet 1,000 mg: 1000 mg | ORAL | @ 12:00:00

## 2023-04-12 MED ADMIN — insulin lispro (HumaLOG) injection 0-20 Units: 0-20 [IU] | SUBCUTANEOUS | @ 17:00:00

## 2023-04-12 MED ADMIN — polyethylene glycol (MIRALAX) packet 17 g: 17 g | ORAL | @ 12:00:00

## 2023-04-12 MED ADMIN — calcium carbonate (TUMS) chewable tablet 400 mg elem calcium: 400 mg | ORAL | @ 17:00:00

## 2023-04-12 MED ADMIN — insulin lispro (HumaLOG) injection 3 Units: 3 [IU] | SUBCUTANEOUS | @ 17:00:00

## 2023-04-12 MED ADMIN — calcium carbonate (TUMS) chewable tablet 300 mg elem calcium: 300 mg | ORAL | @ 12:00:00 | Stop: 2023-04-12

## 2023-04-12 MED ADMIN — dexAMETHasone (DECADRON) tablet 2 mg: 2 mg | ORAL | @ 19:00:00 | Stop: 2023-04-13

## 2023-04-12 MED ADMIN — insulin lispro (HumaLOG) injection 0-20 Units: 0-20 [IU] | SUBCUTANEOUS | @ 21:00:00

## 2023-04-12 MED ADMIN — ascorbic acid (vitamin C) (VITAMIN C) tablet 500 mg: 500 mg | ORAL | @ 12:00:00 | Stop: 2023-04-19

## 2023-04-12 MED ADMIN — bacitracin ointment: TOPICAL | @ 01:00:00 | Stop: 2023-04-12

## 2023-04-12 MED ADMIN — insulin NPH (HumuLIN,NovoLIN) injection 4 Units: 4 [IU] | SUBCUTANEOUS | @ 12:00:00

## 2023-04-12 MED ADMIN — multivitamins, therapeutic with minerals tablet 1 tablet: 1 | ORAL | @ 12:00:00

## 2023-04-12 MED ADMIN — levETIRAcetam (KEPPRA) tablet 500 mg: 500 mg | ORAL | @ 01:00:00 | Stop: 2023-04-18

## 2023-04-12 MED ADMIN — insulin lispro (HumaLOG) injection 0-20 Units: 0-20 [IU] | SUBCUTANEOUS | @ 02:00:00

## 2023-04-12 MED ADMIN — loratadine (CLARITIN) tablet 10 mg: 10 mg | ORAL | @ 12:00:00

## 2023-04-12 MED ADMIN — levothyroxine (SYNTHROID) tablet 375 mcg: 375 ug | ORAL | @ 10:00:00

## 2023-04-12 NOTE — Unmapped (Signed)
 Endocrine Team Follow Up   Consult Note     Consult information:  Requesting Attending Physician : Arman Filter, MD  Service Requesting Consult : Neurosurgery [facility])  Primary Care Provider: Rosine Beat, Benison Pap, DO  Impression:  Kenneth Fitzgerald is a 48 y.o. male admitted for R craniotomy s/p resection of brain mass. We have been consulted at the request of Arman Filter, MD to evaluate [name] for hyperglycemia.     Medical Decision Making:  Diagnoses:  1. Prediabetes/Medication induced hyperglycemia. Uncontrolled   With hyperglycemia.  2. Nutrition: Complicating glycemic control. Increasing risk for both hypoglycemia and hyperglycemia.  3. Steroids. Complicating glycemic control and increasing risk for hyperglycemia.  4. Obesity. Complicating glycemic control and increasing risk for hyperglycemia.  5. Postsurgical hypothyroidism  6. 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 Papillary thyroid cancer s/p thyroidectomy    Studies reviewed 04/12/23:  Labs: BMP, POCT-BG, and Ca , phosphorus, albumin  Interpretation: Hypocalcemia. Hyperphosphatemia. Glucose mostly at inpatient targets.  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 On dexamethasone taper per neurosurgery.    Current dexamethasone taper:   2mg  q8h 3/28-3/30  2mg  q12h 3/30-4/1  2mg  daily 4/2-4/4  1mg  daily 4/4-4/6     One instance of hyperglycemia overnight that corrected with sliding scale. Noted decrease in steroids today. Will monitor and adjust insulin as needed.    Remains hypocalcemic but asymptomatic. Increased calcium supplement yesterday from 600 mg to 900 mg elemental daily, will monitor today. AM phosphorus elevated so would not increase calcitriol at this time.    Recommendations:    Steroid-induced hyperglycemia  Prediabetes  NPH 4u once daily in AM only  Lispro 3u TIDAC  Lispro 1:30>140 ACHS  Hypoglycemia protocol.  POCT-BG achs.  Ensure patient is on glucose precautions if patient taking nutrition by mouth.     Hypocalcemia  Primary hypoparathyroidism  Calcium carbonate 300 mg elemental TID AC  PTA calcitriol 0.95mcg daily  Monitor calcium, phosphorus, albumin daily    Hypothyroidism  Continue levothyroxine daily    Discharge planning:  In process. Will complete closer to discharge.Anticipate being able to discharge off antihyperglycemic agents.   Calcium and calcitriol dose TBD  Will arrange follow-up with Dr. Lyda Perone closer to discharge    Thank you for this consult. Discussed plan with primary team. We will continue to follow and make recommendations and place orders as appropriate.    Please page with questions or concerns: Marisue Humble, [place]: [phone]  Endocrinology Diabetes Care Team on call from 6AM - 3PM on weekdays then endocrine fellow on call: [phone] from 3PM - 6AM on weekdays and on weekends and holidays.   If APP cannot be reached, please page the endocrine fellow on call.      Subjective:    Interval History:  Patient states he is doing well. Does report he is does not believe he is getting enough dietary calcium at hospital as he normally drinks a lot of milk at home which he is not doing here. Denies any nausea or vomiting    Initial HPI:  Kenneth Fitzgerald is a 48 y.o. male with past medical history of preDM, thyroid cancer, hypothyroidism, hypoparathyroidism, admitted for metastatic melanoma who is now sp R  craniotomy for resection (3/21)     Diabetes History:  Patient has history of Prediabetes but is not on any medications at home       Patient is seen by Dr. Lyda Perone at [facility] endocrinology outpatient for papillary thyroid cancer. Takes levothyroxine 375 mcg daily. Also has postsurgical hypoparathyroidism, takes calcitriol 0.25 mcg daily and aiming for 1200-1500 mg of calcium in diet.      Current Nutrition:  Active Orders   Diet    Nutrition Therapy Regular/House       ROS: As per HPI.     ascorbic acid (vitamin C)  500 mg Oral BID    bacitracin   Topical BID    calcitriol  0.25 mcg Oral Daily    calcium carbonate  300 mg elem calcium Oral 3xd Meals    dexAMETHasone  2 mg Oral Q8H SCH    Followed by    Melene Muller ON 04/13/2023] dexAMETHasone  2 mg Oral Q12H SCH    Followed by    Melene Muller ON 04/16/2023] dexAMETHasone  2 mg Oral Daily    Followed by    Melene Muller ON 04/18/2023] dexAMETHasone  1 mg Oral Daily    DULoxetine  30 mg Oral BID    enoxaparin (LOVENOX) injection  40 mg Subcutaneous QPM    ezetimibe  10 mg Oral Daily    flu vacc [id number] 6mos up(PF)  0.5 mL Intramuscular During hospitalization    folic acid  1 mg Oral Daily    insulin lispro  0-20 Units Subcutaneous ACHS    insulin lispro  3 Units Subcutaneous 3xd Meals    insulin NPH  4 Units Subcutaneous Daily    levETIRAcetam  500 mg Oral BID    levothyroxine  375 mcg Oral Daily    lisinopril  10 mg Oral Daily    loratadine  10 mg Oral Daily    multivitamins (ADULT)  1 tablet Oral Daily    polyethylene glycol  17 g Oral BID    senna  2 tablet Oral BID    sulfaSALAzine  1,000 mg Oral BID    zinc sulfate  220 mg Oral Daily       Current Outpatient Medications   Medication Instructions    acetaminophen (TYLENOL) 1,000 mg, 2 times a day (standard)    ALLEGRA ALLERGY 180 mg, Daily PRN    blood sugar diagnostic Strp Dispense 100 blood glucose test strips, ok to sub any brand preferred by insurance/patient, use 3x/day; dispense whatever brand matches with meter.    blood-glucose meter kit Use as instructed; dispense 1 meter, whatever is preferred by insurance    BRAFTOVI 450 mg, Oral, Daily (standard)    calcitriol (ROCALTROL) 0.25 mcg, Oral, Daily (standard)    calcium carbonate 1,500 mg (600 mg elem calcium) tablet 2 tablets, Oral, Daily PRN    dexAMETHasone (DECADRON) 6 mg, Oral, Every 12 hours DULoxetine (CYMBALTA) 30 MG capsule 1 capsule, 2 times a day (standard)    ezetimibe (ZETIA) 10 mg, Oral, Daily (standard)    fluticasone propionate (FLONASE) 50 mcg/actuation nasal spray 1 spray, Daily (standard)    folic acid (FOLVITE) 1,000 mcg, Oral, Daily (standard)    lancets Misc Dispense 100 lancets, ok to sub any brand preferred by insurance/patient, use 3x/day    [name] 45 mg, Oral, 2 times a day (standard)    ondansetron (ZOFRAN) 8 mg, Oral, Every 8 hours PRN    sulfaSALAzine (AZULFIDINE) 1,000 mg, Oral, 2 times a day  SYNTHROID 375 mcg, Oral, Daily (standard)    zinc sulfate (ZINCATE) 220 mg, Oral, Daily (standard)           Past Medical History:   Diagnosis Date    Ankylosing spondylitis     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  Thyroidectomy       Past Surgical History:   Procedure Laterality Date    COLON SURGERY      Partial Colectomy    COLONOSCOPY      COSMETIC SURGERY      Related to excision of Cancer.    PR COLONOSCOPY FLX DX W/COLLJ SPEC WHEN PFRMD N/A 08/29/2021    Procedure: COLONOSCOPY, FLEXIBLE, PROXIMAL TO SPLENIC FLEXURE; DIAGNOSTIC, W/WO COLLECTION SPECIMEN BY BRUSH OR WASH;  Surgeon: Luanne Bras, MD;  [facility];  Service: Gastroenterology    PR COLONOSCOPY W/BIOPSY SINGLE/MULTIPLE N/A 01/09/2018    Procedure: COLONOSCOPY, FLEXIBLE, PROXIMAL TO SPLENIC FLEXURE; WITH BIOPSY, SINGLE OR MULTIPLE;  Surgeon: Vonda Antigua, MD; [facility];  Service: Gastroenterology    PR COLONOSCOPY W/BIOPSY SINGLE/MULTIPLE N/A 08/24/2019    Procedure: COLONOSCOPY, FLEXIBLE, PROXIMAL TO SPLENIC FLEXURE; WITH BIOPSY, SINGLE OR MULTIPLE;  Surgeon: Leland Her, MD;  Location: [facility] PROCEDURES [place] [facility];  Service: Gastroenterology    PR COLONOSCOPY W/BIOPSY SINGLE/MULTIPLE N/A 08/29/2021    Procedure: COLONOSCOPY, FLEXIBLE, PROXIMAL TO SPLENIC FLEXURE; WITH BIOPSY, SINGLE OR MULTIPLE;  Surgeon: Luanne Bras, MD;  Location: [facility];  Service: Gastroenterology    PR COLSC FLX WITH DIRECTED SUBMUCOSAL NJX ANY SBST N/A 08/24/2019    Procedure: COLONOSCOPY, FLEXIBLE, PROXIMAL TO SPLENIC FLEXURE; WITH DIRECTED SUBMUCOSAL INJECTION(S), ANY SUBSTANCE;  Surgeon: Leland Her, MD;  Location: GI PROCEDURES [place] [facility];  Service: Gastroenterology    PR EXPLORATORY OF ABDOMEN N/A 01/24/2020    Procedure: EXPLORATORY LAPAROTOMY, EXPLORATORY CELIOTOMY WITH OR WITHOUT BIOPSY(S);  Surgeon: [name], MD;  Location: MAIN OR [place];  Service: Trauma    PR FREEING BOWEL ADHESION,ENTEROLYSIS N/A 01/24/2020    Procedure: ENTEROLYSIS (SEPART PROC);  Surgeon: [name], MD;  Location: MAIN OR [facility];  Service: Trauma    PR IMPLANT MESH HERNIA REPAIR/DEBRIDEMENT CLOSURE N/A 01/03/2015    Procedure: IMPLANTATION OF MESH/OTHER PROSTHES INCISION/VENTRAL HERNIA REPAIR/MESH CLOSE DEBRID NECROT SOFT TIS INFECT;  Surgeon: Romero Belling, MD;  Location: MAIN OR [place];  Service: Gastrointestinal    PR LAP, VENTRAL HERNIA REPAIR,REDUCIBLE N/A 04/07/2014    Procedure: LAPAROSCOPY, SURGICAL, REPAIR, VENTRAL, UMBILICAL, SPIGELIAN OR EPIGASTRIC HERNIA, REDUCIBLE;  Surgeon: Romero Belling, MD;  Location: MAIN OR [place];  Service: Gastrointestinal    PR NEGATIVE PRESSURE WOUND THERAPY DME >50 SQ CM N/A 01/24/2020    Procedure: NEG PRESS WOUND TX (VAC ASSIST) INCL  TOPICALS, PER SESSION, TSA GREATER THAN/= 50 CM SQUARED;  Surgeon: [name], MD;  Location: MAIN OR [place];  Service: Trauma    PR REMOVAL NODES, NECK,CERV CMPLT Left 12/07/2012    Procedure: CERVICAL LYMPHADENECTOMY (COMPLETE);  Surgeon: Charlott Rakes, MD;  Location: MAIN OR [facility];  Service: Surgical Oncology    PR REMOVAL NODES, NECK,CERV MOD RAD Right 03/29/2013    Procedure: CERVICAL LYMPHADENECTOMY (MODIFIED RADICAL NECK DISSECTION);  Surgeon: Charlott Rakes, MD;  Location: MAIN OR [facility];  Service: Surgical Oncology    PR REPAIR RECURR INCIS [facility] N/A 01/03/2015    Procedure: REPAIR RECURRENT INCISIONAL OR VENTRAL HERNIA; REDUCIBLE;  Surgeon: Romero Belling, MD;  Location: MAIN OR [facility];  Service: Gastrointestinal    PR REPAIR RECURR INCIS HERNIA,STRANG N/A 06/21/2016    Procedure: REPAIR RECURRENT INCISIONAL OR VENTRAL HERNIA; INCARCERATED OR STRANGULATED;  Surgeon: Mickle Asper, MD;  Location: MAIN OR [facility];  Service: Gastrointestinal    PR REPAIR RECURR INCIS HERNIA,STRANG N/A 01/24/2020    Procedure: REPAIR RECURRENT INCISIONAL OR VENTRAL HERNIA; INCARCERATED OR STRANGULATED;  Surgeon: [name], MD;  Location: MAIN OR [place];  Service: Trauma    PR SIGMOIDOSCOPY,FINE NEEDL BX,US GUIDED N/A 01/09/2018    Procedure: SIGMOIDOSCOPY, FLEXIBLE, W/TRANSENDOSCOPIC ULTRASOUND GUIDED NEEDLE ASPIRATION;  Surgeon: Vonda Antigua, MD;  [facility]: [facility];  Service: Gastroenterology    THYROID SURGERY         Family History   Problem Relation Age of Onset    Hyperthyroidism Mother     Osteoporosis Mother     Arrhythmia Mother     Squamous cell carcinoma Mother         basal cell vs squamous cell skin cancer    Arthritis Mother     Cancer Mother     Coronary artery disease Father         s/p CABG    Diabetes Father     Hypertension Father     Prostate cancer Father     Angina Father     Cancer Father     Heart disease Father     Colon cancer Paternal Grandmother     Diabetes Paternal Grandmother     Diabetes Maternal Aunt     Diabetes Paternal Uncle     Diabetes Paternal Aunt     Thyroid disease Neg Hx        Social History     Tobacco Use    Smoking status: Never     Passive exposure: Never    Smokeless tobacco: Never   Vaping Use    Vaping status: Never Used   Substance Use Topics    Alcohol use: Yes     Comment: social drinker 3 times yearly    Drug use: No       OBJECTIVE:  BP 145/92  - Pulse 71  - Temp 36.7 ??C (98.1 ??F) (Oral)  - Resp 17  - Ht 188 cm (6' 2)  - Wt (!) 132.4 kg (291 lb 14.2 oz)  - SpO2 98%  - BMI 37.48 kg/m??   Wt Readings from Last 12 Encounters:   04/07/23 (!) 132.4 kg (291 lb 14.2 oz)   03/27/23 (!) 140.3 kg (309 lb 4.9 oz)   03/24/23 (!) 140.3 kg (309 lb 4.9 oz)   02/18/23 (!) 144 kg (317 lb 6.4 oz)   02/12/23 (!) 145.2 kg (320 lb 1.7 oz)   02/04/23 (!) 145.6 kg (321 lb)   01/30/23 Marland Kitchen)  146.1 kg (322 lb)   12/30/22 (!) 143.8 kg (317 lb 1.6 oz)   11/14/22 (!) 144.1 kg (317 lb 9.6 oz)   10/30/22 (!) 145.7 kg (321 lb 3.2 oz)   10/07/22 (!) (P) 143.3 kg (316 lb)   08/30/22 (!) 143.2 kg (315 lb 12.8 oz)     Physical Exam:  General: male in no apparent distress  HEENT: EOMI, sclera anicteric  Respiratory: No increased work of breathing  Neuro: Awake, alert, and oriented; no tremors  Psych: Normal mood and affect  Skin: No abnormal skin pigmentation    BG/insulin reviewed per EMR.   Glucose, POC   Date Value   04/11/2023 314 mg/dL (H)   91/47/8295 621 mg/dL (H)   30/86/5784 696 mg/dL   29/52/8413 244 mg/dL   01/16/7251 664 mg/dL (H)   40/34/7425 956 mg/dL   38/75/6433 295 mg/dL   18/84/1660 630 mg/dL   16/01/930 90 mg/dL   35/57/3220 98 mg/dL   25/42/7062 95 mg/dL   37/62/8315 94 mg/dL   17/61/6073 710 MG/DL   62/69/4854 94 MG/DL   62/70/3500 938 MG/DL        Summary of labs:  Lab Results   Component Value Date    A1C 5.8 (H) 04/06/2023    A1C 5.4 05/10/2020    A1C 5.8 (H) 01/03/2020     Lab Results   Component Value Date    GFR >= 60 03/23/2012    CREATININE 0.60 (L) 04/12/2023     Lab Results   Component Value Date    WBC 13.6 (H) 04/08/2023    HGB 12.7 (L) 04/08/2023    HCT 37.2 (L) 04/08/2023    PLT 248 04/08/2023       Lab Results   Component Value Date    NA 137 04/12/2023    K 4.0 04/12/2023    CL 101 04/12/2023    CO2 25.0 04/12/2023    BUN 17 04/12/2023    CREATININE 0.60 (L) 04/12/2023    GLU 116 04/12/2023    CALCIUM 6.7 (L) 04/12/2023    MG 2.1 04/08/2023    PHOS 5.3 (H) 04/12/2023       Lab Results   Component Value Date    BILITOT 0.3 04/04/2023    BILIDIR 0.20 01/27/2020    PROT 7.1 04/04/2023    ALBUMIN 3.2 (L) 04/12/2023    ALT 99 (H) 04/04/2023    AST 67 (H) 04/04/2023    ALKPHOS 138 (H) 04/04/2023    GGT 42 08/22/2011

## 2023-04-12 NOTE — Unmapped (Signed)
 Vitals remain stable. Neuro checks performed, no changes. Wounds remain clean and intact. No signs of new skin breakdown. Voiding adequately. Safety precautions in place. No questions or concerns at this time.   Problem: Wound  Goal: Absence of Infection Signs and Symptoms  Intervention: Prevent or Manage Infection  Recent Flowsheet Documentation  Taken 04/11/2023 2000 by Lawernce Ion, RN  Infection Management: aseptic technique maintained  Goal: Skin Health and Integrity  Intervention: Optimize Skin Protection  Recent Flowsheet Documentation  Taken 04/11/2023 2000 by Lawernce Ion, RN  Pressure Reduction Techniques: frequent weight shift encouraged  Pressure Reduction Devices: positioning supports utilized  Skin Protection: adhesive use limited     Problem: Adult Inpatient Plan of Care  Goal: Absence of Hospital-Acquired Illness or Injury  Intervention: Identify and Manage Fall Risk  Recent Flowsheet Documentation  Taken 04/11/2023 2000 by Lawernce Ion, RN  Safety Interventions: fall reduction program maintained  Intervention: Prevent Skin Injury  Recent Flowsheet Documentation  Taken 04/11/2023 2000 by Lawernce Ion, RN  Positioning for Skin: Supine/Back  Device Skin Pressure Protection: absorbent pad utilized/changed  Skin Protection: adhesive use limited  Intervention: Prevent and Manage VTE (Venous Thromboembolism) Risk  Recent Flowsheet Documentation  Taken 04/11/2023 2000 by Lawernce Ion, RN  Anti-Embolism Device Type: SCD, Knee  Anti-Embolism Device Status: Refused  Anti-Embolism Device Location: BLE  Intervention: Prevent Infection  Recent Flowsheet Documentation  Taken 04/11/2023 2000 by Lawernce Ion, RN  Infection Prevention: hand hygiene promoted     Problem: Fall Injury Risk  Goal: Absence of Fall and Fall-Related Injury  Intervention: Promote Injury-Free Environment  Recent Flowsheet Documentation  Taken 04/11/2023 2000 by Lawernce Ion, RN  Safety Interventions: fall reduction program maintained Problem: Skin Injury Risk Increased  Goal: Skin Health and Integrity  Intervention: Optimize Skin Protection  Recent Flowsheet Documentation  Taken 04/11/2023 2000 by Lawernce Ion, RN  Pressure Reduction Techniques: frequent weight shift encouraged  Pressure Reduction Devices: positioning supports utilized  Skin Protection: adhesive use limited

## 2023-04-12 NOTE — Unmapped (Signed)
 [facility] SERVICE   INPATIENT PROGRESS NOTE  Attending of Record: Salli Quarry, MD     For questions/concerns regarding patients:  Mon-Fri 6 AM-6 PM, please page Cranial Neurosurgery Service at [phone] (ICU) OR [phone] (Floor/ISCU)  Sun-Thurs 6 PM-6 AM AND weekends/holidays, please page Neurosurgery Night Float/ Inpatient Weekend Service        Brief History of Present Illness  Kenneth Fitzgerald is a 48 y.o. male with metastatic melanoma who is now sp R craniotomy for resection (3/21)    Subjective/Interval History  NAEO  Glucose 116-314    Interval Imaging Reviewed  None    Neurological Assessment and Plan  **Metastasis to brain  POD8 R craniotomy  - Na>135  - SBP<160  - dexamethasone taper over 2 weeks   - post op MRI and CT completed  - gamma tiles trial  - keppra 500mg  bidx14d  - follow up path, frozen consistent with metastatic melanoma   - oncology following for initiation of braf/mek inhibitors               - Holding per oncology  - PT/OT  - ok to gently wash head        Non-neurologic problems being addressed:  **Electrolytes  Hypocalcemia  - postsurgical hypoparathyroidism  - continue calcium and calcitriol  - appreciate endocrinology recs    **PreDM  - appreciate endocrinology recs  - on dex taper    **Medication-induced coagulopathy requiring further treatment   - Anticoagulant/Antiplatelet: none      Daily Maintenance:  VTE chemoprophylaxis: Lovenox  IV Fluids: none  Bowel Regimen: Senna, Miralax  Foley status: none  Nutrition: reg    Disposition: medically stable for SNF    ___________________________________________________________________      Neurological Exam  EOSp  AOX3  PERRL  EOMI  FS  TML  RUE/RLE 5  LUE/LLE 5  SILT  No drift    Incision cdi staples    The patient's vitals, intake/output, labs, orders, and relevant imaging were reviewed for the last 24 hours.    Problem List  Principal Problem:    Metastasis to brain

## 2023-04-13 LAB — ALBUMIN: ALBUMIN: 3.3 g/dL — ABNORMAL LOW (ref 3.4–5.0)

## 2023-04-13 LAB — BASIC METABOLIC PANEL
ANION GAP: 8 mmol/L (ref 5–14)
BLOOD UREA NITROGEN: 16 mg/dL (ref 9–23)
BUN / CREAT RATIO: 26
CALCIUM: 6.8 mg/dL — ABNORMAL LOW (ref 8.7–10.4)
CHLORIDE: 102 mmol/L (ref 98–107)
CO2: 24 mmol/L (ref 20.0–31.0)
CREATININE: 0.61 mg/dL — ABNORMAL LOW (ref 0.73–1.18)
EGFR CKD-EPI (2021) MALE: >90 mL/min/1.73m2 (ref >=60)
GLUCOSE RANDOM: 142 mg/dL (ref 70–179)
POTASSIUM: 4.3 mmol/L (ref 3.4–4.8)
SODIUM: 134 mmol/L — ABNORMAL LOW (ref 135–145)

## 2023-04-13 LAB — PHOSPHORUS: PHOSPHORUS: 5.4 mg/dL — ABNORMAL HIGH (ref 2.4–5.1)

## 2023-04-13 MED ADMIN — lisinopril (PRINIVIL,ZESTRIL) tablet 10 mg: 10 mg | ORAL | @ 14:00:00

## 2023-04-13 MED ADMIN — insulin NPH (HumuLIN,NovoLIN) injection 4 Units: 4 [IU] | SUBCUTANEOUS | @ 14:00:00

## 2023-04-13 MED ADMIN — enoxaparin (LOVENOX) syringe 40 mg: 40 mg | SUBCUTANEOUS | @ 22:00:00

## 2023-04-13 MED ADMIN — sulfaSALAzine (AZULFIDINE) tablet 1,000 mg: 1000 mg | ORAL | @ 14:00:00

## 2023-04-13 MED ADMIN — folic acid (FOLVITE) tablet 1 mg: 1 mg | ORAL | @ 14:00:00

## 2023-04-13 MED ADMIN — ascorbic acid (vitamin C) (VITAMIN C) tablet 500 mg: 500 mg | ORAL | @ 01:00:00 | Stop: 2023-04-19

## 2023-04-13 MED ADMIN — loratadine (CLARITIN) tablet 10 mg: 10 mg | ORAL | @ 14:00:00

## 2023-04-13 MED ADMIN — dexAMETHasone (DECADRON) tablet 2 mg: 2 mg | ORAL | @ 01:00:00 | Stop: 2023-04-13

## 2023-04-13 MED ADMIN — sulfaSALAzine (AZULFIDINE) tablet 1,000 mg: 1000 mg | ORAL | @ 01:00:00

## 2023-04-13 MED ADMIN — calcitriol (ROCALTROL) capsule 0.25 mcg: .25 ug | ORAL | @ 14:00:00

## 2023-04-13 MED ADMIN — insulin lispro (HumaLOG) injection 3 Units: 3 [IU] | SUBCUTANEOUS | @ 14:00:00

## 2023-04-13 MED ADMIN — insulin lispro (HumaLOG) injection 0-20 Units: 0-20 [IU] | SUBCUTANEOUS | @ 23:00:00

## 2023-04-13 MED ADMIN — multivitamins, therapeutic with minerals tablet 1 tablet: 1 | ORAL | @ 14:00:00

## 2023-04-13 MED ADMIN — calcium carbonate (TUMS) chewable tablet 400 mg elem calcium: 400 mg | ORAL | @ 14:00:00

## 2023-04-13 MED ADMIN — dexAMETHasone (DECADRON) tablet 2 mg: 2 mg | ORAL | @ 18:00:00 | Stop: 2023-04-13

## 2023-04-13 MED ADMIN — DULoxetine (CYMBALTA) DR capsule 30 mg: 30 mg | ORAL | @ 14:00:00

## 2023-04-13 MED ADMIN — insulin lispro (HumaLOG) injection 0-20 Units: 0-20 [IU] | SUBCUTANEOUS | @ 14:00:00

## 2023-04-13 MED ADMIN — dexAMETHasone (DECADRON) tablet 2 mg: 2 mg | ORAL | @ 09:00:00 | Stop: 2023-04-13

## 2023-04-13 MED ADMIN — calcium carbonate (TUMS) chewable tablet 400 mg elem calcium: 400 mg | ORAL | @ 23:00:00

## 2023-04-13 MED ADMIN — insulin lispro (HumaLOG) injection 3 Units: 3 [IU] | SUBCUTANEOUS | @ 18:00:00

## 2023-04-13 MED ADMIN — DULoxetine (CYMBALTA) DR capsule 30 mg: 30 mg | ORAL | @ 01:00:00

## 2023-04-13 MED ADMIN — oxyCODONE (ROXICODONE) immediate release tablet 5 mg: 5 mg | ORAL | @ 23:00:00 | Stop: 2023-04-18

## 2023-04-13 MED ADMIN — levothyroxine (SYNTHROID) tablet 375 mcg: 375 ug | ORAL | @ 09:00:00

## 2023-04-13 MED ADMIN — ascorbic acid (vitamin C) (VITAMIN C) tablet 500 mg: 500 mg | ORAL | @ 14:00:00 | Stop: 2023-04-19

## 2023-04-13 MED ADMIN — levETIRAcetam (KEPPRA) tablet 500 mg: 500 mg | ORAL | @ 14:00:00 | Stop: 2023-04-18

## 2023-04-13 MED ADMIN — zinc sulfate (ZINCATE) capsule 220 mg: 220 mg | ORAL | @ 14:00:00

## 2023-04-13 MED ADMIN — ezetimibe (ZETIA) tablet 10 mg: 10 mg | ORAL | @ 14:00:00

## 2023-04-13 MED ADMIN — insulin lispro (HumaLOG) injection 3 Units: 3 [IU] | SUBCUTANEOUS | @ 23:00:00

## 2023-04-13 MED ADMIN — levETIRAcetam (KEPPRA) tablet 500 mg: 500 mg | ORAL | @ 01:00:00 | Stop: 2023-04-18

## 2023-04-13 MED ADMIN — insulin lispro (HumaLOG) injection 0-20 Units: 0-20 [IU] | SUBCUTANEOUS | @ 01:00:00

## 2023-04-13 MED ADMIN — calcium carbonate (TUMS) chewable tablet 400 mg elem calcium: 400 mg | ORAL | @ 18:00:00

## 2023-04-13 NOTE — Unmapped (Signed)
 Endocrine Team Follow Up   Consult Note     Consult information:  Requesting Attending Physician : Arman Filter, MD  Service Requesting Consult : Neurosurgery [facility])  Primary Care Provider: Rosine Beat, Benison Pap, DO  Impression:  Kenneth Fitzgerald is a 48 y.o. male admitted for R craniotomy s/p resection of brain mass. We have been consulted at the request of Arman Filter, MD to evaluate Kenneth Fitzgerald for hyperglycemia.     Medical Decision Making:  Diagnoses:  1. Prediabetes/Medication induced hyperglycemia. Uncontrolled   With hyperglycemia.  2. Nutrition: Complicating glycemic control. Increasing risk for both hypoglycemia and hyperglycemia.  3. Steroids. Complicating glycemic control and increasing risk for hyperglycemia.  4. Obesity. Complicating glycemic control and increasing risk for hyperglycemia.  5. Postsurgical hypothyroidism  6. 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 Papillary thyroid cancer s/p thyroidectomy    Studies reviewed 04/13/23:  Labs: BMP, POCT-BG, and Ca , phosphorus, albumin  Interpretation: Hypocalcemia. Hyperphosphatemia. Glucose mostly at inpatient targets.  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 On dexamethasone taper per neurosurgery.    Current dexamethasone taper:   2mg  q8h 3/28-3/30  2mg  q12h 3/30-4/1  2mg  daily 4/2-4/4  1mg  daily 4/4-4/6     Glucose overall at inpatient targets.    Remains hypocalcemic without symptoms. Dose increased yesterday, will monitor over next few days.    Recommendations:    Steroid-induced hyperglycemia  Prediabetes  NPH 4u once daily in AM only  Lispro 3u TIDAC  Lispro 1:30>140 ACHS  Hypoglycemia protocol.  POCT-BG achs.  Ensure patient is on glucose precautions if patient taking nutrition by mouth.     Hypocalcemia  Primary hypoparathyroidism  Calcium carbonate 400 mg elemental TID AC  PTA calcitriol 0.41mcg daily  Monitor calcium, phosphorus, albumin daily    Hypothyroidism  Continue levothyroxine daily    Discharge planning:  In process. Will complete closer to discharge.Anticipate being able to discharge off antihyperglycemic agents.   Calcium and calcitriol dose TBD  Will arrange follow-up with Dr. Lyda Perone closer to discharge    Thank you for this consult. Discussed plan with primary team. We will continue to follow and make recommendations and place orders as appropriate.    Please page with questions or concerns: Marisue Humble, [place]: [phone]  Endocrinology Diabetes Care Team on call from 6AM - 3PM on weekdays then endocrine fellow on call: [phone] from 3PM - 6AM on weekdays and on weekends and holidays.   If APP cannot be reached, please page the endocrine fellow on call.      Subjective:  Denies perioral numbness or tingling.    Interval History:  Patient states he is doing well. Does report he is does not believe he is getting enough dietary calcium at hospital as he normally drinks a lot of milk at home which he is not doing here. Denies any nausea or vomiting    Initial HPI:  Kenneth Fitzgerald is a 48 y.o. male with past medical history of preDM, thyroid cancer, hypothyroidism, hypoparathyroidism, admitted for metastatic melanoma who is now sp R craniotomy for resection (3/21)     Diabetes History:  Patient has history of Prediabetes but is not on any medications at home  Patient is seen by Dr. Lyda Perone at [facility] endocrinology outpatient for papillary thyroid cancer. Takes levothyroxine 375 mcg daily. Also has postsurgical hypoparathyroidism, takes calcitriol 0.25 mcg daily and aiming for 1200-1500 mg of calcium in diet.      Current Nutrition:  Active Orders   Diet    Nutrition Therapy Regular/House       ROS: As per HPI.     ascorbic acid (vitamin C)  500 mg Oral BID    calcitriol  0.25 mcg Oral Daily    calcium carbonate  400 mg elem calcium Oral 3xd Meals    dexAMETHasone  2 mg Oral Q8H SCH    Followed by    dexAMETHasone  2 mg Oral Q12H SCH    Followed by    Melene Muller ON 04/16/2023] dexAMETHasone  2 mg Oral Daily    Followed by    Melene Muller ON 04/18/2023] dexAMETHasone  1 mg Oral Daily    DULoxetine  30 mg Oral BID    enoxaparin (LOVENOX) injection  40 mg Subcutaneous QPM    ezetimibe  10 mg Oral Daily    flu vacc [id number] 6mos up(PF)  0.5 mL Intramuscular During hospitalization    folic acid  1 mg Oral Daily    insulin lispro  0-20 Units Subcutaneous ACHS    insulin lispro  3 Units Subcutaneous 3xd Meals    insulin NPH  4 Units Subcutaneous Daily    levETIRAcetam  500 mg Oral BID    levothyroxine  375 mcg Oral Daily    lisinopril  10 mg Oral Daily    loratadine  10 mg Oral Daily    multivitamins (ADULT)  1 tablet Oral Daily    polyethylene glycol  17 g Oral BID    senna  2 tablet Oral BID    sulfaSALAzine  1,000 mg Oral BID    zinc sulfate  220 mg Oral Daily       Current Outpatient Medications   Medication Instructions    acetaminophen (TYLENOL) 1,000 mg, 2 times a day (standard)    ALLEGRA ALLERGY 180 mg, Daily PRN    blood sugar diagnostic Strp Dispense 100 blood glucose test strips, ok to sub any brand preferred by insurance/patient, use 3x/day; dispense whatever brand matches with meter.    blood-glucose meter kit Use as instructed; dispense 1 meter, whatever is preferred by insurance    BRAFTOVI 450 mg, Oral, Daily (standard)    calcitriol (ROCALTROL) 0.25 mcg, Oral, Daily (standard)    calcium carbonate 1,500 mg (600 mg elem calcium) tablet 2 tablets, Oral, Daily PRN    dexAMETHasone (DECADRON) 6 mg, Oral, Every 12 hours    DULoxetine (CYMBALTA) 30 MG capsule 1 capsule, 2 times a day (standard)    ezetimibe (ZETIA) 10 mg, Oral, Daily (standard)    fluticasone propionate (FLONASE) 50 mcg/actuation nasal spray 1 spray, Daily (standard)    folic acid (FOLVITE) 1,000 mcg, Oral, Daily (standard)    lancets Misc Dispense 100 lancets, ok to sub any brand preferred by insurance/patient, use 3x/day    [name] 45 mg, Oral, 2 times a day (standard)    ondansetron (ZOFRAN) 8 mg, Oral, Every 8 hours PRN    sulfaSALAzine (AZULFIDINE) 1,000 mg, Oral, 2 times a day    SYNTHROID 375 mcg, Oral, Daily (standard)    zinc sulfate (ZINCATE) 220 mg, Oral, Daily (standard)           Past Medical History:   Diagnosis Date    Ankylosing  spondylitis     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  Thyroidectomy       Past Surgical History:   Procedure Laterality Date    COLON SURGERY      Partial Colectomy    COLONOSCOPY      COSMETIC SURGERY      Related to excision of Cancer.    PR COLONOSCOPY FLX DX W/COLLJ SPEC WHEN PFRMD N/A 08/29/2021    Procedure: COLONOSCOPY, FLEXIBLE, PROXIMAL TO SPLENIC FLEXURE; DIAGNOSTIC, W/WO COLLECTION SPECIMEN BY BRUSH OR WASH;  Surgeon: Luanne Bras, MD;  Location: [facility];  Service: Gastroenterology    PR COLONOSCOPY W/BIOPSY SINGLE/MULTIPLE N/A 01/09/2018    Procedure: COLONOSCOPY, FLEXIBLE, PROXIMAL TO SPLENIC FLEXURE; WITH BIOPSY, SINGLE OR MULTIPLE;  Surgeon: Vonda Antigua, MD;  [facility]: [facility];  Service: Gastroenterology    PR COLONOSCOPY W/BIOPSY SINGLE/MULTIPLE N/A 08/24/2019    Procedure: COLONOSCOPY, FLEXIBLE, PROXIMAL TO SPLENIC FLEXURE; WITH BIOPSY, SINGLE OR MULTIPLE;  Surgeon: Leland Her, MD;  Location: GI PROCEDURES [facility];  Service: Gastroenterology    PR COLONOSCOPY W/BIOPSY SINGLE/MULTIPLE N/A 08/29/2021    Procedure: COLONOSCOPY, FLEXIBLE, PROXIMAL TO SPLENIC FLEXURE; WITH BIOPSY, SINGLE OR MULTIPLE;  Surgeon: Luanne Bras, MD;  Location: [facility];  Service: Gastroenterology    PR COLSC FLX WITH DIRECTED SUBMUCOSAL NJX ANY SBST N/A 08/24/2019    Procedure: COLONOSCOPY, FLEXIBLE, PROXIMAL TO SPLENIC FLEXURE; WITH DIRECTED SUBMUCOSAL INJECTION(S), ANY SUBSTANCE;  Surgeon: Leland Her, MD;  Location: [facility] PROCEDURES [facility];  Service: Gastroenterology    PR EXPLORATORY OF ABDOMEN N/A 01/24/2020    Procedure: EXPLORATORY LAPAROTOMY, EXPLORATORY CELIOTOMY WITH OR WITHOUT BIOPSY(S);  Surgeon: [name], MD;  Location: MAIN OR [place];  Service: Trauma    PR FREEING BOWEL ADHESION,ENTEROLYSIS N/A 01/24/2020    Procedure: ENTEROLYSIS (SEPART PROC);  Surgeon: [name], MD;  Location: MAIN OR [facility];  Service: Trauma    PR IMPLANT MESH HERNIA REPAIR/DEBRIDEMENT CLOSURE N/A 01/03/2015    Procedure: IMPLANTATION OF MESH/OTHER PROSTHES INCISION/VENTRAL HERNIA REPAIR/MESH CLOSE DEBRID NECROT SOFT TIS INFECT;  Surgeon: Romero Belling, MD;  Location: MAIN OR [place];  Service: Gastrointestinal    PR LAP, VENTRAL HERNIA REPAIR,REDUCIBLE N/A 04/07/2014    Procedure: LAPAROSCOPY, SURGICAL, REPAIR, VENTRAL, UMBILICAL, SPIGELIAN OR EPIGASTRIC HERNIA, REDUCIBLE;  Surgeon: Romero Belling, MD;  Location: MAIN OR [place];  Service: Gastrointestinal    PR NEGATIVE PRESSURE WOUND THERAPY DME >50 SQ CM N/A 01/24/2020    Procedure: NEG PRESS WOUND TX (VAC ASSIST) INCL TOPICALS, PER SESSION, TSA GREATER THAN/= 50 CM SQUARED;  Surgeon: [name], MD;  Location: MAIN OR [place];  Service: Trauma    PR REMOVAL NODES, NECK,CERV CMPLT Left 12/07/2012    Procedure:  CERVICAL LYMPHADENECTOMY (COMPLETE);  Surgeon: Charlott Rakes, MD;  Location: MAIN OR [facility]; Service: Surgical Oncology    PR REMOVAL NODES, NECK,CERV MOD RAD Right 03/29/2013    Procedure: CERVICAL LYMPHADENECTOMY (MODIFIED RADICAL NECK DISSECTION);  Surgeon: Charlott Rakes, MD;  Location: MAIN OR [facility];  Service: Surgical Oncology    PR REPAIR RECURR INCIS [facility] N/A 01/03/2015    Procedure: REPAIR RECURRENT INCISIONAL OR VENTRAL HERNIA; REDUCIBLE;  Surgeon: Romero Belling, MD;  Location: MAIN OR [facility];  Service: Gastrointestinal    PR REPAIR RECURR INCIS HERNIA,STRANG N/A 06/21/2016    Procedure: REPAIR RECURRENT INCISIONAL OR VENTRAL HERNIA; INCARCERATED OR STRANGULATED;  Surgeon: Mickle Asper, MD;  Location: MAIN OR [facility];  Service: Gastrointestinal    PR REPAIR RECURR INCIS HERNIA,STRANG N/A 01/24/2020    Procedure: REPAIR RECURRENT INCISIONAL OR VENTRAL HERNIA; INCARCERATED OR STRANGULATED;  Surgeon: [name], MD;  Location: MAIN OR [place];  Service: Trauma    PR SIGMOIDOSCOPY,FINE NEEDL BX,US GUIDED N/A 01/09/2018    Procedure: SIGMOIDOSCOPY, FLEXIBLE, W/TRANSENDOSCOPIC ULTRASOUND GUIDED NEEDLE ASPIRATION;  Surgeon: Vonda Antigua, MD;  Location: [facility]EDURES [facility];  Service: Gastroenterology    THYROID SURGERY         Family History   Problem Relation Age of Onset    Hyperthyroidism Mother     Osteoporosis Mother     Arrhythmia Mother     Squamous cell carcinoma Mother         basal cell vs squamous cell skin cancer    Arthritis Mother     Cancer Mother     Coronary artery disease Father         s/p CABG    Diabetes Father     Hypertension Father     Prostate cancer Father     Angina Father     Cancer Father     Heart disease Father     Colon cancer Paternal Grandmother     Diabetes Paternal Grandmother     Diabetes Maternal Aunt     Diabetes Paternal Uncle     Diabetes Paternal Aunt     Thyroid disease Neg Hx        Social History     Tobacco Use    Smoking status: Never     Passive exposure: Never    Smokeless tobacco: Never   Vaping Use    Vaping status: Never Used   Substance Use Topics    Alcohol use: Yes     Comment: social drinker 3 times yearly    Drug use: No       OBJECTIVE:  BP 136/79  - Pulse 63  - Temp 35.6 ??C (96.1 ??F) (Temporal)  - Resp 18  - Ht 188 cm (6' 2)  - Wt (!) 132.4 kg (291 lb 14.2 oz)  - SpO2 96%  - BMI 37.48 kg/m??   Wt Readings from Last 12 Encounters:   04/07/23 (!) 132.4 kg (291 lb 14.2 oz)   03/27/23 (!) 140.3 kg (309 lb 4.9 oz)   03/24/23 (!) 140.3 kg (309 lb 4.9 oz)   02/18/23 (!) 144 kg (317 lb 6.4 oz)   02/12/23 (!) 145.2 kg (320 lb 1.7 oz)   02/04/23 (!) 145.6 kg (321 lb)   01/30/23 (!) 146.1 kg (322 lb)   12/30/22 (!) 143.8 kg (317 lb 1.6 oz)   11/14/22 (!) 144.1 kg (317 lb 9.6 oz)   10/30/22 (!) 145.7 kg (321 lb 3.2 oz)   10/07/22 (!) (  P) 143.3 kg (316 lb)   08/30/22 (!) 143.2 kg (315 lb 12.8 oz)     Physical Exam:  General: male in no apparent distress  HEENT: EOMI, sclera anicteric, negative [place]  Respiratory: No increased work of breathing  Neuro: Awake, alert, and oriented; no tremors  Psych: Normal mood and affect  Skin: No abnormal skin pigmentation    BG/insulin reviewed per EMR.   Glucose, POC   Date Value   04/12/2023 241 mg/dL (H)   16/10/9602 540 mg/dL   98/11/9145 829 mg/dL (H)   56/21/3086 578 mg/dL   46/96/2952 841 mg/dL (H)   32/44/0102 725 mg/dL (H)   36/64/4034 742 mg/dL   59/56/3875 643 mg/dL   32/95/1884 90 mg/dL   16/60/6301 98 mg/dL   60/10/9321 95 mg/dL   55/73/2202 94 mg/dL   54/27/0623 762 MG/DL   83/15/1761 94 MG/DL   60/73/7106 269 MG/DL        Summary of labs:  Lab Results   Component Value Date    A1C 5.8 (H) 04/06/2023    A1C 5.4 05/10/2020    A1C 5.8 (H) 01/03/2020     Lab Results   Component Value Date    GFR >= 60 03/23/2012    CREATININE 0.61 (L) 04/13/2023     Lab Results   Component Value Date    WBC 13.6 (H) 04/08/2023    HGB 12.7 (L) 04/08/2023    HCT 37.2 (L) 04/08/2023    PLT 248 04/08/2023       Lab Results   Component Value Date    NA 134 (L) 04/13/2023    K 4.3 04/13/2023    CL 102 04/13/2023 CO2 24.0 04/13/2023    BUN 16 04/13/2023    CREATININE 0.61 (L) 04/13/2023    GLU 142 04/13/2023    CALCIUM 6.8 (L) 04/13/2023    MG 2.1 04/08/2023    PHOS 5.4 (H) 04/13/2023       Lab Results   Component Value Date    BILITOT 0.3 04/04/2023    BILIDIR 0.20 01/27/2020    PROT 7.1 04/04/2023    ALBUMIN 3.3 (L) 04/13/2023    ALT 99 (H) 04/04/2023    AST 67 (H) 04/04/2023    ALKPHOS 138 (H) 04/04/2023    GGT 42 08/22/2011

## 2023-04-13 NOTE — Unmapped (Cosign Needed)
 CRANIAL NEUROSURGERY SERVICE   INPATIENT PROGRESS NOTE  Attending of Record: Salli Quarry, MD     For questions/concerns regarding patients:  Mon-Fri 6 AM-6 PM, please page Cranial Neurosurgery Service at [phone] (ICU) OR [phone] (Floor/ISCU)  Sun-Thurs 6 PM-6 AM AND weekends/holidays, please page Neurosurgery Night Float/ Inpatient Weekend Service        Brief History of Present Illness  Kenneth Fitzgerald is a 48 y.o. male with metastatic melanoma who is now sp R craniotomy for resection (3/21)    Subjective/Interval History  NAEO  Glucose 116-241    Interval Imaging Reviewed  None    Neurological Assessment and Plan  **Metastasis to brain  POD9 R craniotomy  - Na>135  - SBP<160  - dexamethasone taper over 2 weeks   - post op MRI and CT completed  - gamma tiles trial  - keppra 500mg  bidx14d  - follow up path, frozen consistent with metastatic melanoma   - oncology following for initiation of braf/mek inhibitors               - Holding per oncology  - PT/OT  - ok to gently wash head        Non-neurologic problems being addressed:  **Electrolytes  Hypocalcemia, hyperphosphatemia  - postsurgical hypoparathyroidism  - continue calcium and calcitriol  - appreciate endocrinology recs    **PreDM  - appreciate endocrinology recs  - on dex taper    **Medication-induced coagulopathy requiring further treatment   - Anticoagulant/Antiplatelet: none      Daily Maintenance:  VTE chemoprophylaxis: Lovenox  IV Fluids: none  Bowel Regimen: Senna, Miralax  Foley status: none  Nutrition: reg    Disposition: medically stable for SNF    ___________________________________________________________________      Neurological Exam  EOSp  AOX3  PERRL  EOMI  FS  TML  RUE/RLE 5  LUE/LLE 5  SILT  No drift    Incision cdi staples, small amount of surrounding erythema but no drainage or warmth    The patient's vitals, intake/output, labs, orders, and relevant imaging were reviewed for the last 24 hours.    Problem List  Principal Problem:    Metastasis to brain

## 2023-04-13 NOTE — Unmapped (Signed)
 Vitals remain stable. Neuro checks performed, no changes. Wounds remain clean and intact. No signs of new skin breakdown. Voiding adequately. Safety precautions in place. No questions or concerns at this time   Problem: Wound  Goal: Absence of Infection Signs and Symptoms  Intervention: Prevent or Manage Infection  Recent Flowsheet Documentation  Taken 04/12/2023 2000 by Lawernce Ion, RN  Infection Management: aseptic technique maintained  Goal: Skin Health and Integrity  Intervention: Optimize Skin Protection  Recent Flowsheet Documentation  Taken 04/12/2023 2000 by Lawernce Ion, RN  Pressure Reduction Techniques: frequent weight shift encouraged  Pressure Reduction Devices: positioning supports utilized  Skin Protection: adhesive use limited     Problem: Adult Inpatient Plan of Care  Goal: Absence of Hospital-Acquired Illness or Injury  Intervention: Identify and Manage Fall Risk  Recent Flowsheet Documentation  Taken 04/12/2023 2000 by Lawernce Ion, RN  Safety Interventions: aspiration precautions  Intervention: Prevent Skin Injury  Recent Flowsheet Documentation  Taken 04/12/2023 2000 by Lawernce Ion, RN  Positioning for Skin: Supine/Back  Device Skin Pressure Protection: absorbent pad utilized/changed  Skin Protection: adhesive use limited  Intervention: Prevent and Manage VTE (Venous Thromboembolism) Risk  Recent Flowsheet Documentation  Taken 04/12/2023 2000 by Lawernce Ion, RN  Anti-Embolism Device Type: SCD, Knee  Anti-Embolism Device Status: Refused  Anti-Embolism Device Location: BLE  Intervention: Prevent Infection  Recent Flowsheet Documentation  Taken 04/12/2023 2000 by Lawernce Ion, RN  Infection Prevention: hand hygiene promoted     Problem: Fall Injury Risk  Goal: Absence of Fall and Fall-Related Injury  Intervention: Promote Injury-Free Environment  Recent Flowsheet Documentation  Taken 04/12/2023 2000 by Lawernce Ion, RN  Safety Interventions: aspiration precautions     Problem: Skin Injury Risk Increased  Goal: Skin Health and Integrity  Intervention: Optimize Skin Protection  Recent Flowsheet Documentation  Taken 04/12/2023 2000 by Lawernce Ion, RN  Pressure Reduction Techniques: frequent weight shift encouraged  Pressure Reduction Devices: positioning supports utilized  Skin Protection: adhesive use limited

## 2023-04-13 NOTE — Unmapped (Signed)
 VENOUS ACCESS TEAM PROCEDURE    Nurse request was placed for a PIV by Venous Access Team (VAT).  Patient was assessed at bedside for placement of a PIV. PPE were donned per protocol.  Access was obtained. Blood return noted.  Dressing intact and device well secured.  Flushed with normal saline.  See LDA for details.  Pt advised to inform RN of any s/s of discomfort at the PIV site.    Workup / Procedure Time:  15 minutes       Primary RN was notified.       Thank you,     Melodye Ped, RN Venous Access Team

## 2023-04-13 NOTE — Unmapped (Signed)
 No complaints of headaches or dizziness. Op-site remains clean and dry. Ambulated around unit. No signs of hypo/hyperglycemia. Vitals stable throughout shift. Care and monitoring continue.   Problem: Wound  Goal: Optimal Coping  Outcome: Progressing  Goal: Optimal Functional Ability  Outcome: Progressing  Intervention: Optimize Functional Ability  Recent Flowsheet Documentation  Taken 04/13/2023 1600 by Mariah Milling, RN  Activity Management: up ad lib  Goal: Absence of Infection Signs and Symptoms  Outcome: Progressing  Goal: Improved Oral Intake  Outcome: Progressing  Goal: Optimal Pain Control and Function  Outcome: Progressing  Goal: Skin Health and Integrity  Outcome: Progressing  Intervention: Optimize Skin Protection  Recent Flowsheet Documentation  Taken 04/13/2023 1600 by Mariah Milling, RN  Activity Management: up ad lib  Pressure Reduction Techniques: frequent weight shift encouraged  Pressure Reduction Devices: pressure-redistributing mattress utilized  Skin Protection:   adhesive use limited   tubing/devices free from skin contact  Goal: Optimal Wound Healing  Outcome: Progressing     Problem: Adult Inpatient Plan of Care  Goal: Plan of Care Review  Outcome: Progressing  Flowsheets (Taken 04/13/2023 1616)  Progress: improving  Plan of Care Reviewed With: patient  Goal: Patient-Specific Goal (Individualized)  Outcome: Progressing  Goal: Absence of Hospital-Acquired Illness or Injury  Outcome: Progressing  Intervention: Identify and Manage Fall Risk  Recent Flowsheet Documentation  Taken 04/13/2023 1600 by Mariah Milling, RN  Safety Interventions:   lighting adjusted for tasks/safety   low bed   nonskid shoes/slippers when out of bed   room near unit station  Intervention: Prevent Skin Injury  Recent Flowsheet Documentation  Taken 04/13/2023 1600 by Mariah Milling, RN  Positioning for Skin: (Ambulating around unit) Other (Comment)  Device Skin Pressure Protection:   tubing/devices free from skin contact adhesive use limited  Skin Protection:   adhesive use limited   tubing/devices free from skin contact  Goal: Optimal Comfort and Wellbeing  Outcome: Progressing  Goal: Readiness for Transition of Care  Outcome: Progressing  Goal: Rounds/Family Conference  Outcome: Progressing     Problem: Fall Injury Risk  Goal: Absence of Fall and Fall-Related Injury  Outcome: Progressing  Intervention: Promote Scientist, clinical (histocompatibility and immunogenetics) Documentation  Taken 04/13/2023 1600 by Mariah Milling, RN  Safety Interventions:   lighting adjusted for tasks/safety   low bed   nonskid shoes/slippers when out of bed   room near unit station     Problem: Skin Injury Risk Increased  Goal: Skin Health and Integrity  Outcome: Progressing  Intervention: Optimize Skin Protection  Recent Flowsheet Documentation  Taken 04/13/2023 1600 by Mariah Milling, RN  Activity Management: up ad lib  Pressure Reduction Techniques: frequent weight shift encouraged  Pressure Reduction Devices: pressure-redistributing mattress utilized  Skin Protection:   adhesive use limited   tubing/devices free from skin contact     Problem: Self-Care Deficit  Goal: Improved Ability to Complete Activities of Daily Living  Outcome: Progressing

## 2023-04-14 LAB — BASIC METABOLIC PANEL
ANION GAP: 12 mmol/L (ref 5–14)
BLOOD UREA NITROGEN: 19 mg/dL (ref 9–23)
BUN / CREAT RATIO: 25
CALCIUM: 7.7 mg/dL — ABNORMAL LOW (ref 8.7–10.4)
CHLORIDE: 95 mmol/L — ABNORMAL LOW (ref 98–107)
CO2: 29 mmol/L (ref 20.0–31.0)
CREATININE: 0.75 mg/dL (ref 0.73–1.18)
EGFR CKD-EPI (2021) MALE: >90 mL/min/1.73m2 (ref >=60)
GLUCOSE RANDOM: 131 mg/dL (ref 70–179)
POTASSIUM: 4 mmol/L (ref 3.4–4.8)
SODIUM: 136 mmol/L (ref 135–145)

## 2023-04-14 LAB — PHOSPHORUS: PHOSPHORUS: 6.3 mg/dL — ABNORMAL HIGH (ref 2.4–5.1)

## 2023-04-14 LAB — ALBUMIN: ALBUMIN: 3.4 g/dL (ref 3.4–5.0)

## 2023-04-14 MED ADMIN — folic acid (FOLVITE) tablet 1 mg: 1 mg | ORAL | @ 12:00:00

## 2023-04-14 MED ADMIN — enoxaparin (LOVENOX) syringe 40 mg: 40 mg | SUBCUTANEOUS | @ 22:00:00

## 2023-04-14 MED ADMIN — levothyroxine (SYNTHROID) tablet 375 mcg: 375 ug | ORAL | @ 09:00:00

## 2023-04-14 MED ADMIN — levETIRAcetam (KEPPRA) tablet 500 mg: 500 mg | ORAL | @ 01:00:00 | Stop: 2023-04-18

## 2023-04-14 MED ADMIN — ezetimibe (ZETIA) tablet 10 mg: 10 mg | ORAL | @ 12:00:00

## 2023-04-14 MED ADMIN — insulin NPH (HumuLIN,NovoLIN) injection 4 Units: 4 [IU] | SUBCUTANEOUS | @ 12:00:00

## 2023-04-14 MED ADMIN — calcium carbonate (TUMS) chewable tablet 400 mg elem calcium: 400 mg | ORAL | @ 12:00:00

## 2023-04-14 MED ADMIN — oxyCODONE (ROXICODONE) immediate release tablet 5 mg: 5 mg | ORAL | @ 22:00:00 | Stop: 2023-04-18

## 2023-04-14 MED ADMIN — ascorbic acid (vitamin C) (VITAMIN C) tablet 500 mg: 500 mg | ORAL | @ 01:00:00 | Stop: 2023-04-19

## 2023-04-14 MED ADMIN — sulfaSALAzine (AZULFIDINE) tablet 1,000 mg: 1000 mg | ORAL | @ 01:00:00

## 2023-04-14 MED ADMIN — dexAMETHasone (DECADRON) tablet 2 mg: 2 mg | ORAL | @ 01:00:00 | Stop: 2023-04-15

## 2023-04-14 MED ADMIN — oxyCODONE (ROXICODONE) immediate release tablet 10 mg: 10 mg | ORAL | @ 06:00:00 | Stop: 2023-04-19

## 2023-04-14 MED ADMIN — calcitriol (ROCALTROL) capsule 0.25 mcg: .25 ug | ORAL | @ 12:00:00

## 2023-04-14 MED ADMIN — insulin lispro (HumaLOG) injection 3 Units: 3 [IU] | SUBCUTANEOUS | @ 12:00:00

## 2023-04-14 MED ADMIN — dexAMETHasone (DECADRON) tablet 2 mg: 2 mg | ORAL | @ 12:00:00 | Stop: 2023-04-15

## 2023-04-14 MED ADMIN — calcium carbonate (TUMS) chewable tablet 400 mg elem calcium: 400 mg | ORAL | @ 22:00:00

## 2023-04-14 MED ADMIN — DULoxetine (CYMBALTA) DR capsule 30 mg: 30 mg | ORAL | @ 12:00:00

## 2023-04-14 MED ADMIN — lisinopril (PRINIVIL,ZESTRIL) tablet 10 mg: 10 mg | ORAL | @ 12:00:00

## 2023-04-14 MED ADMIN — loratadine (CLARITIN) tablet 10 mg: 10 mg | ORAL | @ 12:00:00

## 2023-04-14 MED ADMIN — insulin lispro (HumaLOG) injection 3 Units: 3 [IU] | SUBCUTANEOUS | @ 18:00:00

## 2023-04-14 MED ADMIN — levETIRAcetam (KEPPRA) tablet 500 mg: 500 mg | ORAL | @ 12:00:00 | Stop: 2023-04-18

## 2023-04-14 MED ADMIN — DULoxetine (CYMBALTA) DR capsule 30 mg: 30 mg | ORAL | @ 01:00:00

## 2023-04-14 MED ADMIN — calcium carbonate (TUMS) chewable tablet 400 mg elem calcium: 400 mg | ORAL | @ 16:00:00

## 2023-04-14 MED ADMIN — sulfaSALAzine (AZULFIDINE) tablet 1,000 mg: 1000 mg | ORAL | @ 12:00:00

## 2023-04-14 MED ADMIN — ascorbic acid (vitamin C) (VITAMIN C) tablet 500 mg: 500 mg | ORAL | @ 12:00:00 | Stop: 2023-04-19

## 2023-04-14 MED ADMIN — multivitamins, therapeutic with minerals tablet 1 tablet: 1 | ORAL | @ 12:00:00

## 2023-04-14 MED ADMIN — insulin lispro (HumaLOG) injection 0-20 Units: 0-20 [IU] | SUBCUTANEOUS | @ 01:00:00

## 2023-04-14 MED ADMIN — zinc sulfate (ZINCATE) capsule 220 mg: 220 mg | ORAL | @ 12:00:00

## 2023-04-14 NOTE — Unmapped (Signed)
 Vitals remain stable. No neuro changes. Pain well controlled (see MAR). Wounds remain clean and intact. No signs of new skin breakdown. Voiding adequately. Completing ADLs independently. Safety precautions in place.     Problem: Wound  Goal: Optimal Coping  Outcome: Progressing  Goal: Optimal Functional Ability  Outcome: Progressing  Goal: Absence of Infection Signs and Symptoms  Outcome: Progressing  Goal: Improved Oral Intake  Outcome: Progressing  Goal: Optimal Pain Control and Function  Outcome: Progressing  Goal: Skin Health and Integrity  Outcome: Progressing  Goal: Optimal Wound Healing  Outcome: Progressing     Problem: Adult Inpatient Plan of Care  Goal: Plan of Care Review  Outcome: Progressing  Goal: Patient-Specific Goal (Individualized)  Outcome: Progressing  Goal: Absence of Hospital-Acquired Illness or Injury  Outcome: Progressing  Intervention: Identify and Manage Fall Risk  Recent Flowsheet Documentation  Taken 04/14/2023 0825 by Donney Dice, RN  Safety Interventions:   fall reduction program maintained   low bed  Goal: Optimal Comfort and Wellbeing  Outcome: Progressing  Goal: Readiness for Transition of Care  Outcome: Progressing  Goal: Rounds/Family Conference  Outcome: Progressing     Problem: Fall Injury Risk  Goal: Absence of Fall and Fall-Related Injury  Outcome: Progressing  Intervention: Promote Scientist, clinical (histocompatibility and immunogenetics) Documentation  Taken 04/14/2023 0825 by Donney Dice, RN  Safety Interventions:   fall reduction program maintained   low bed     Problem: Skin Injury Risk Increased  Goal: Skin Health and Integrity  Outcome: Progressing     Problem: Self-Care Deficit  Goal: Improved Ability to Complete Activities of Daily Living  Outcome: Progressing

## 2023-04-14 NOTE — Unmapped (Signed)
 Endocrine Team Follow Up   Consult Note     Consult information:  Requesting Attending Physician : Arman Filter, MD  Service Requesting Consult : Neurosurgery [facility])  Primary Care Provider: Rosine Beat, Benison Pap, DO  Impression:  Kenneth Fitzgerald is a 48 y.o. male admitted for R craniotomy s/p resection of brain mass. We have been consulted at the request of Arman Filter, MD to evaluate Kenneth Fitzgerald for hyperglycemia.     Medical Decision Making:  Diagnoses:  1. Prediabetes/Medication induced hyperglycemia. Uncontrolled   With hyperglycemia.  2. Nutrition: Complicating glycemic control. Increasing risk for both hypoglycemia and hyperglycemia.  3. Steroids. Complicating glycemic control and increasing risk for hyperglycemia.  4. Obesity. Complicating glycemic control and increasing risk for hyperglycemia.  5. Postsurgical hypothyroidism  6. 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 Papillary thyroid cancer s/p thyroidectomy    Studies reviewed 04/14/23:  Labs: BMP, POCT-BG, and Ca , phosphorus, albumin  Interpretation: Hypocalcemia. Hyperphosphatemia. Glucose mostly at inpatient targets.  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 On dexamethasone taper per neurosurgery.    Current dexamethasone taper:   2mg  q12h 3/30-4/1  2mg  daily 4/2-4/4  1mg  daily 4/4-4/6     Glucose overall at inpatient targets.    Remains hypocalcemic without symptoms. Calcium improved. Hyperphosphatemia mildly worse.    Recommendations:    Steroid-induced hyperglycemia  Prediabetes  NPH 4u once daily in AM only  Lispro 3u TIDAC  Lispro 1:30>140 ACHS  Hypoglycemia protocol.  POCT-BG achs.  Ensure patient is on glucose precautions if patient taking nutrition by mouth.     Hypocalcemia  Hyperphosphatemia  Primary hypoparathyroidism  Calcium carbonate 400 mg elemental TID AC  PTA calcitriol 0.74mcg daily  Monitor calcium, phosphorus, albumin daily  Encourage PO fluid intake for hyperphosphatemia    Hypothyroidism  Continue levothyroxine daily    Discharge planning:  In process. Will complete closer to discharge.Anticipate being able to discharge off antihyperglycemic agents.   Calcium and calcitriol dose TBD  Will arrange follow-up with Dr. Lyda Perone closer to discharge    Thank you for this consult. Discussed plan with primary team. We will continue to follow and make recommendations and place orders as appropriate.    Please page with questions or concerns: Marisue Humble, [place]: [phone]  Endocrinology Diabetes Care Team on call from 6AM - 3PM on weekdays then endocrine fellow on call: [phone] from 3PM - 6AM on weekdays and on weekends and holidays.   If APP cannot be reached, please page the endocrine fellow on call.      Subjective:  Denies perioral numbness or tingling.    Interval History:  Patient states he is doing well. Does report he is does not believe he is getting enough dietary calcium at hospital as he normally drinks a lot of milk at home which he is not doing here. Denies any nausea or vomiting    Initial HPI:  Kenneth Fitzgerald is a 48 y.o. male with past medical history of preDM, thyroid cancer, hypothyroidism, hypoparathyroidism, admitted for metastatic melanoma who is now sp R craniotomy for resection (3/21)     Diabetes History:  Patient has history of Prediabetes but is not on any medications at home  Patient is seen by Dr. Lyda Perone at [facility] endocrinology outpatient for papillary thyroid cancer. Takes levothyroxine 375 mcg daily. Also has postsurgical hypoparathyroidism, takes calcitriol 0.25 mcg daily and aiming for 1200-1500 mg of calcium in diet.      Current Nutrition:  Active Orders   Diet    Nutrition Therapy Regular/House       ROS: As per HPI.     ascorbic acid (vitamin C)  500 mg Oral BID    calcitriol  0.25 mcg Oral Daily    calcium carbonate  400 mg elem calcium Oral 3xd Meals    dexAMETHasone  2 mg Oral Q12H SCH    Followed by    Melene Muller ON 04/16/2023] dexAMETHasone  2 mg Oral Daily    Followed by    Melene Muller ON 04/18/2023] dexAMETHasone  1 mg Oral Daily    DULoxetine  30 mg Oral BID    enoxaparin (LOVENOX) injection  40 mg Subcutaneous QPM    ezetimibe  10 mg Oral Daily    flu vacc [id number] 6mos up(PF)  0.5 mL Intramuscular During hospitalization    folic acid  1 mg Oral Daily    insulin lispro  0-20 Units Subcutaneous ACHS    insulin lispro  3 Units Subcutaneous 3xd Meals    insulin NPH  4 Units Subcutaneous Daily    levETIRAcetam  500 mg Oral BID    levothyroxine  375 mcg Oral Daily    lisinopril  10 mg Oral Daily    loratadine  10 mg Oral Daily    multivitamins (ADULT)  1 tablet Oral Daily    polyethylene glycol  17 g Oral BID    senna  2 tablet Oral BID    sulfaSALAzine  1,000 mg Oral BID    zinc sulfate  220 mg Oral Daily       Current Outpatient Medications   Medication Instructions    acetaminophen (TYLENOL) 1,000 mg, 2 times a day (standard)    ALLEGRA ALLERGY 180 mg, Daily PRN    blood sugar diagnostic Strp Dispense 100 blood glucose test strips, ok to sub any brand preferred by insurance/patient, use 3x/day; dispense whatever brand matches with meter.    blood-glucose meter kit Use as instructed; dispense 1 meter, whatever is preferred by insurance    BRAFTOVI 450 mg, Oral, Daily (standard)    calcitriol (ROCALTROL) 0.25 mcg, Oral, Daily (standard)    calcium carbonate 1,500 mg (600 mg elem calcium) tablet 2 tablets, Oral, Daily PRN    dexAMETHasone (DECADRON) 6 mg, Oral, Every 12 hours    DULoxetine (CYMBALTA) 30 MG capsule 1 capsule, 2 times a day (standard)    ezetimibe (ZETIA) 10 mg, Oral, Daily (standard)    fluticasone propionate (FLONASE) 50 mcg/actuation nasal spray 1 spray, Daily (standard)    folic acid (FOLVITE) 1,000 mcg, Oral, Daily (standard)    lancets Misc Dispense 100 lancets, ok to sub any brand preferred by insurance/patient, use 3x/day    [name] 45 mg, Oral, 2 times a day (standard)    ondansetron (ZOFRAN) 8 mg, Oral, Every 8 hours PRN    sulfaSALAzine (AZULFIDINE) 1,000 mg, Oral, 2 times a day    SYNTHROID 375 mcg, Oral, Daily (standard)    zinc sulfate (ZINCATE) 220 mg, Oral, Daily (standard)           Past Medical History:   Diagnosis Date    Ankylosing spondylitis     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  Thyroidectomy       Past Surgical History:   Procedure Laterality Date    COLON SURGERY      Partial Colectomy    COLONOSCOPY      COSMETIC SURGERY      Related to excision of Cancer.    PR COLONOSCOPY FLX DX W/COLLJ SPEC WHEN PFRMD N/A 08/29/2021    Procedure: COLONOSCOPY, FLEXIBLE, PROXIMAL TO SPLENIC FLEXURE; DIAGNOSTIC, W/WO COLLECTION SPECIMEN BY BRUSH OR WASH;  Surgeon: Luanne Bras, MD;  [facility];  Service: Gastroenterology    PR COLONOSCOPY W/BIOPSY SINGLE/MULTIPLE N/A 01/09/2018    Procedure: COLONOSCOPY, FLEXIBLE, PROXIMAL TO SPLENIC FLEXURE; WITH BIOPSY, SINGLE OR MULTIPLE;  Surgeon: Vonda Antigua, MD;  [facility];  Service: Gastroenterology    PR COLONOSCOPY W/BIOPSY SINGLE/MULTIPLE N/A 08/24/2019    Procedure: COLONOSCOPY, FLEXIBLE, PROXIMAL TO SPLENIC FLEXURE; WITH BIOPSY, SINGLE OR MULTIPLE;  Surgeon: Leland Her, MD;  Location: GI PROCEDURES [place] [facility];  Service: Gastroenterology    PR COLONOSCOPY W/BIOPSY SINGLE/MULTIPLE N/A 08/29/2021    Procedure: COLONOSCOPY, FLEXIBLE, PROXIMAL TO SPLENIC FLEXURE; WITH BIOPSY, SINGLE OR MULTIPLE;  Surgeon: Luanne Bras, MD;  Location: [facility];  Service: Gastroenterology    PR COLSC FLX WITH DIRECTED SUBMUCOSAL NJX ANY SBST N/A 08/24/2019    Procedure: COLONOSCOPY, FLEXIBLE, PROXIMAL TO SPLENIC FLEXURE; WITH DIRECTED SUBMUCOSAL INJECTION(S), ANY SUBSTANCE;  Surgeon: Leland Her, MD;  Location: GI PROCEDURES [facility] [facility];  Service: Gastroenterology    PR EXPLORATORY OF ABDOMEN N/A 01/24/2020    Procedure: EXPLORATORY LAPAROTOMY, EXPLORATORY CELIOTOMY WITH OR WITHOUT BIOPSY(S);  Surgeon: [name], MD;  Location: MAIN OR [place];  Service: Trauma    PR FREEING BOWEL ADHESION,ENTEROLYSIS N/A 01/24/2020    Procedure: ENTEROLYSIS (SEPART PROC);  Surgeon: [name], MD;  Location: MAIN OR [facility];  Service: Trauma    PR IMPLANT MESH HERNIA REPAIR/DEBRIDEMENT CLOSURE N/A 01/03/2015    Procedure: IMPLANTATION OF MESH/OTHER PROSTHES INCISION/VENTRAL HERNIA REPAIR/MESH CLOSE DEBRID NECROT SOFT TIS INFECT;  Surgeon: Romero Belling, MD;  Location: MAIN OR [place];  Service: Gastrointestinal    PR LAP, VENTRAL HERNIA REPAIR,REDUCIBLE N/A 04/07/2014    Procedure: LAPAROSCOPY, SURGICAL, REPAIR, VENTRAL, UMBILICAL, SPIGELIAN OR EPIGASTRIC HERNIA, REDUCIBLE;  Surgeon: Romero Belling, MD;  Location: MAIN OR [place];  Service: Gastrointestinal    PR NEGATIVE PRESSURE WOUND THERAPY DME >50 SQ CM N/A 01/24/2020    Procedure: NEG PRESS WOUND TX (VAC ASSIST) INCL TOPICALS, PER SESSION, TSA GREATER THAN/= 50 CM SQUARED;  Surgeon: [name], MD;  Location: MAIN OR [place];  Service: Trauma    PR REMOVAL NODES, NECK,CERV CMPLT Left 12/07/2012    Procedure: CERVICAL LYMPHADENECTOMY (COMPLETE);  Surgeon: Charlott Rakes, MD;  Location: MAIN OR [facility];  Service: Surgical  Oncology    PR REMOVAL NODES, NECK,CERV MOD RAD Right 03/29/2013    Procedure: CERVICAL LYMPHADENECTOMY (MODIFIED RADICAL NECK DISSECTION);  Surgeon: Charlott Rakes, MD;  Location: MAIN OR [facility];  Service: Surgical Oncology    PR REPAIR RECURR INCIS [facility] N/A 01/03/2015    Procedure: REPAIR RECURRENT INCISIONAL OR VENTRAL HERNIA; REDUCIBLE;  Surgeon: Romero Belling, MD;  Location: MAIN OR [facility];  Service: Gastrointestinal    PR REPAIR RECURR INCIS HERNIA,STRANG N/A 06/21/2016    Procedure: REPAIR RECURRENT INCISIONAL OR VENTRAL HERNIA; INCARCERATED OR STRANGULATED;  Surgeon: Mickle Asper, MD;  Location: MAIN OR [facility];  Service: Gastrointestinal    PR REPAIR RECURR INCIS HERNIA,STRANG N/A 01/24/2020    Procedure: REPAIR RECURRENT INCISIONAL OR VENTRAL HERNIA; INCARCERATED OR STRANGULATED;  Surgeon: [name], MD;  Location: MAIN OR [place];  Service: Trauma    PR SIGMOIDOSCOPY,FINE NEEDL BX,US GUIDED N/A 01/09/2018    Procedure: SIGMOIDOSCOPY, FLEXIBLE, W/TRANSENDOSCOPIC ULTRASOUND GUIDED NEEDLE ASPIRATION;  Surgeon: Vonda Antigua, MD;  [facility]: [facility];  Service: Gastroenterology    THYROID SURGERY         Family History   Problem Relation Age of Onset    Hyperthyroidism Mother     Osteoporosis Mother     Arrhythmia Mother     Squamous cell carcinoma Mother         basal cell vs squamous cell skin cancer    Arthritis Mother     Cancer Mother     Coronary artery disease Father         s/p CABG    Diabetes Father     Hypertension Father     Prostate cancer Father     Angina Father     Cancer Father     Heart disease Father     Colon cancer Paternal Grandmother     Diabetes Paternal Grandmother     Diabetes Maternal Aunt     Diabetes Paternal Uncle     Diabetes Paternal Aunt     Thyroid disease Neg Hx        Social History     Tobacco Use    Smoking status: Never     Passive exposure: Never    Smokeless tobacco: Never   Vaping Use    Vaping status: Never Used Substance Use Topics    Alcohol use: Yes     Comment: social drinker 3 times yearly    Drug use: No       OBJECTIVE:  BP 145/82  - Pulse 65  - Temp 36.2 ??C (97.2 ??F) (Temporal)  - Resp 18  - Ht 188 cm (6' 2)  - Wt (!) 132.4 kg (291 lb 14.2 oz)  - SpO2 97%  - BMI 37.48 kg/m??   Wt Readings from Last 12 Encounters:   04/07/23 (!) 132.4 kg (291 lb 14.2 oz)   03/27/23 (!) 140.3 kg (309 lb 4.9 oz)   03/24/23 (!) 140.3 kg (309 lb 4.9 oz)   02/18/23 (!) 144 kg (317 lb 6.4 oz)   02/12/23 (!) 145.2 kg (320 lb 1.7 oz)   02/04/23 (!) 145.6 kg (321 lb)   01/30/23 (!) 146.1 kg (322 lb)   12/30/22 (!) 143.8 kg (317 lb 1.6 oz)   11/14/22 (!) 144.1 kg (317 lb 9.6 oz)   10/30/22 (!) 145.7 kg (321 lb 3.2 oz)   10/07/22 (!) (P) 143.3 kg (316 lb)   08/30/22 (!) 143.2 kg (315 lb 12.8 oz)  Physical Exam:  General: male in no apparent distress  HEENT: EOMI, sclera anicteric, negative [place]  Respiratory: No increased work of breathing  Neuro: Awake, alert, and oriented; no tremors  Psych: Normal mood and affect  Skin: No abnormal skin pigmentation    BG/insulin reviewed per EMR.   Glucose, POC   Date Value   04/13/2023 230 mg/dL (H)   25/36/6440 347 mg/dL   42/59/5638 93 mg/dL   75/64/3329 518 mg/dL   84/16/6063 016 mg/dL (H)   01/22/3233 573 mg/dL   22/02/5425 062 mg/dL (H)   37/62/8315 176 mg/dL   16/07/3708 90 mg/dL   62/69/4854 98 mg/dL   62/70/3500 95 mg/dL   93/81/8299 94 mg/dL   37/16/9678 938 MG/DL   10/30/5100 94 MG/DL   58/52/7782 423 MG/DL        Summary of labs:  Lab Results   Component Value Date    A1C 5.8 (H) 04/06/2023    A1C 5.4 05/10/2020    A1C 5.8 (H) 01/03/2020     Lab Results   Component Value Date    GFR >= 60 03/23/2012    CREATININE 0.61 (L) 04/13/2023     Lab Results   Component Value Date    WBC 13.6 (H) 04/08/2023    HGB 12.7 (L) 04/08/2023    HCT 37.2 (L) 04/08/2023    PLT 248 04/08/2023       Lab Results   Component Value Date    NA 134 (L) 04/13/2023    K 4.3 04/13/2023    CL 102 04/13/2023    CO2 24.0 04/13/2023    BUN 16 04/13/2023    CREATININE 0.61 (L) 04/13/2023    GLU 142 04/13/2023    CALCIUM 6.8 (L) 04/13/2023    MG 2.1 04/08/2023    PHOS 5.4 (H) 04/13/2023       Lab Results   Component Value Date    BILITOT 0.3 04/04/2023    BILIDIR 0.20 01/27/2020    PROT 7.1 04/04/2023    ALBUMIN 3.3 (L) 04/13/2023    ALT 99 (H) 04/04/2023    AST 67 (H) 04/04/2023    ALKPHOS 138 (H) 04/04/2023    GGT 42 08/22/2011

## 2023-04-14 NOTE — Unmapped (Signed)
 COMPLEX CASE MANAGEMENT   Brief Note      This patient has been reviewed for Complex Case Management services and is not eligible at this time due to: Alternative management of care needs: SNF . To have this patient reevaluated for Complex Case Management please place AMB Referral for Case Management to the Personal Health Advocate Department.     [facility] Management Assistant  [facility]  [address]  [place], [place] [address]  She/Her/Hers  P: [phone] F: [phone]  [email or web address]

## 2023-04-14 NOTE — Unmapped (Signed)
 CRANIAL NEUROSURGERY SERVICE   INPATIENT PROGRESS NOTE  Attending of Record: Salli Quarry, MD     For questions/concerns regarding patients:  Mon-Fri 6 AM-6 PM, please page Cranial Neurosurgery Service at [phone] (ICU) OR [phone] (Floor/ISCU)  Sun-Thurs 6 PM-6 AM AND weekends/holidays, please page Neurosurgery Night Float/ Inpatient Weekend Service        Brief History of Present Illness  Kenneth Fitzgerald is a 48 y.o. male with metastatic melanoma who is now sp R craniotomy for resection (3/21)    Subjective/Interval History  NAEON, pending dispo    Interval Imaging Reviewed  None    Neurological Assessment and Plan  **Metastasis to brain  POD10  - Na>135  - SBP<160  - dexamethasone taper over 2 weeks   - post op MRI and CT completed  - gamma tiles trial  - keppra 500mg  bidx14d  - follow up path, frozen consistent with metastatic melanoma   - oncology following for initiation of braf/mek inhibitors               - Holding per oncology  - PT/OT  - hold off on head wash today        Non-neurologic problems being addressed:  **Electrolytes  Hypocalcemia, hyperphosphatemia  - postsurgical hypoparathyroidism  - continue calcium and calcitriol  - appreciate endocrinology recs    **PreDM  - appreciate endocrinology recs  - on dex taper    **Medication-induced coagulopathy requiring further treatment   - Anticoagulant/Antiplatelet: none      Daily Maintenance:  VTE chemoprophylaxis: Lovenox  IV Fluids: none  Bowel Regimen: Senna, Miralax  Foley status: none  Nutrition: reg    Disposition: medically stable for SNF    ___________________________________________________________________      Neurological Exam  EOSp  AOX3  PERRL  EOMI  FS  TML  RUE/RLE 5  LUE/LLE 5  SILT  No drift    Incision cdi staples, small amount of surrounding erythema but no drainage or warmth    The patient's vitals, intake/output, labs, orders, and relevant imaging were reviewed for the last 24 hours.    Problem List  Principal Problem: Metastasis to brain

## 2023-04-14 NOTE — Unmapped (Signed)
 Vitals remain stable. Neuro checks performed, no changes. Wounds remain clean and intact. No signs of new skin breakdown. Voiding adequately. Safety precautions in place. No questions or concerns at this time.   Problem: Wound  Goal: Absence of Infection Signs and Symptoms  Intervention: Prevent or Manage Infection  Recent Flowsheet Documentation  Taken 04/13/2023 2000 by Lawernce Ion, RN  Infection Management: aseptic technique maintained  Goal: Skin Health and Integrity  Intervention: Optimize Skin Protection  Recent Flowsheet Documentation  Taken 04/13/2023 2000 by Lawernce Ion, RN  Pressure Reduction Techniques: frequent weight shift encouraged  Pressure Reduction Devices: positioning supports utilized  Skin Protection: adhesive use limited     Problem: Adult Inpatient Plan of Care  Goal: Absence of Hospital-Acquired Illness or Injury  Intervention: Identify and Manage Fall Risk  Recent Flowsheet Documentation  Taken 04/13/2023 2000 by Lawernce Ion, RN  Safety Interventions: lighting adjusted for tasks/safety  Intervention: Prevent Skin Injury  Recent Flowsheet Documentation  Taken 04/13/2023 2000 by Lawernce Ion, RN  Positioning for Skin: Supine/Back  Device Skin Pressure Protection: absorbent pad utilized/changed  Skin Protection: adhesive use limited  Intervention: Prevent and Manage VTE (Venous Thromboembolism) Risk  Recent Flowsheet Documentation  Taken 04/13/2023 2000 by Lawernce Ion, RN  Anti-Embolism Device Type: SCD, Knee  Anti-Embolism Device Status: Refused  Anti-Embolism Device Location: BLE  Intervention: Prevent Infection  Recent Flowsheet Documentation  Taken 04/13/2023 2000 by Lawernce Ion, RN  Infection Prevention: hand hygiene promoted     Problem: Fall Injury Risk  Goal: Absence of Fall and Fall-Related Injury  Intervention: Promote Injury-Free Environment  Recent Flowsheet Documentation  Taken 04/13/2023 2000 by Lawernce Ion, RN  Safety Interventions: lighting adjusted for tasks/safety Problem: Skin Injury Risk Increased  Goal: Skin Health and Integrity  Intervention: Optimize Skin Protection  Recent Flowsheet Documentation  Taken 04/13/2023 2000 by Lawernce Ion, RN  Pressure Reduction Techniques: frequent weight shift encouraged  Pressure Reduction Devices: positioning supports utilized  Skin Protection: adhesive use limited

## 2023-04-15 LAB — BASIC METABOLIC PANEL
ANION GAP: 14 mmol/L (ref 5–14)
BLOOD UREA NITROGEN: 22 mg/dL (ref 9–23)
BUN / CREAT RATIO: 34
CALCIUM: 7.9 mg/dL — ABNORMAL LOW (ref 8.7–10.4)
CHLORIDE: 98 mmol/L (ref 98–107)
CO2: 23 mmol/L (ref 20.0–31.0)
CREATININE: 0.65 mg/dL — ABNORMAL LOW (ref 0.73–1.18)
EGFR CKD-EPI (2021) MALE: >90 mL/min/1.73m2 (ref >=60)
GLUCOSE RANDOM: 131 mg/dL (ref 70–179)
POTASSIUM: 4.2 mmol/L (ref 3.4–4.8)
SODIUM: 135 mmol/L (ref 135–145)

## 2023-04-15 LAB — PHOSPHORUS: PHOSPHORUS: 5.5 mg/dL — ABNORMAL HIGH (ref 2.4–5.1)

## 2023-04-15 LAB — ALBUMIN: ALBUMIN: 3.7 g/dL (ref 3.4–5.0)

## 2023-04-15 MED ADMIN — DULoxetine (CYMBALTA) DR capsule 30 mg: 30 mg | ORAL

## 2023-04-15 MED ADMIN — sulfaSALAzine (AZULFIDINE) tablet 1,000 mg: 1000 mg | ORAL | @ 12:00:00

## 2023-04-15 MED ADMIN — dexAMETHasone (DECADRON) tablet 2 mg: 2 mg | ORAL | @ 12:00:00 | Stop: 2023-04-15

## 2023-04-15 MED ADMIN — loratadine (CLARITIN) tablet 10 mg: 10 mg | ORAL | @ 13:00:00

## 2023-04-15 MED ADMIN — oxyCODONE (ROXICODONE) immediate release tablet 5 mg: 5 mg | ORAL | @ 12:00:00 | Stop: 2023-04-18

## 2023-04-15 MED ADMIN — calcium carbonate (TUMS) chewable tablet 400 mg elem calcium: 400 mg | ORAL | @ 12:00:00

## 2023-04-15 MED ADMIN — lisinopril (PRINIVIL,ZESTRIL) tablet 10 mg: 10 mg | ORAL | @ 12:00:00

## 2023-04-15 MED ADMIN — calcitriol (ROCALTROL) capsule 0.25 mcg: .25 ug | ORAL | @ 12:00:00

## 2023-04-15 MED ADMIN — calcium carbonate (TUMS) chewable tablet 400 mg elem calcium: 400 mg | ORAL | @ 22:00:00

## 2023-04-15 MED ADMIN — levETIRAcetam (KEPPRA) tablet 500 mg: 500 mg | ORAL | Stop: 2023-04-18

## 2023-04-15 MED ADMIN — insulin NPH (HumuLIN,NovoLIN) injection 4 Units: 4 [IU] | SUBCUTANEOUS | @ 12:00:00

## 2023-04-15 MED ADMIN — dexAMETHasone (DECADRON) tablet 2 mg: 2 mg | ORAL | Stop: 2023-04-15

## 2023-04-15 MED ADMIN — levETIRAcetam (KEPPRA) tablet 500 mg: 500 mg | ORAL | @ 12:00:00 | Stop: 2023-04-18

## 2023-04-15 MED ADMIN — DULoxetine (CYMBALTA) DR capsule 30 mg: 30 mg | ORAL | @ 12:00:00

## 2023-04-15 MED ADMIN — insulin lispro (HumaLOG) injection 3 Units: 3 [IU] | SUBCUTANEOUS | @ 12:00:00

## 2023-04-15 MED ADMIN — insulin lispro (HumaLOG) injection 3 Units: 3 [IU] | SUBCUTANEOUS | @ 23:00:00

## 2023-04-15 MED ADMIN — multivitamins, therapeutic with minerals tablet 1 tablet: 1 | ORAL | @ 12:00:00

## 2023-04-15 MED ADMIN — levothyroxine (SYNTHROID) tablet 375 mcg: 375 ug | ORAL | @ 09:00:00

## 2023-04-15 MED ADMIN — ascorbic acid (vitamin C) (VITAMIN C) tablet 500 mg: 500 mg | ORAL | Stop: 2023-04-19

## 2023-04-15 MED ADMIN — insulin lispro (HumaLOG) injection 3 Units: 3 [IU] | SUBCUTANEOUS

## 2023-04-15 MED ADMIN — oxyCODONE (ROXICODONE) immediate release tablet 5 mg: 5 mg | ORAL | @ 22:00:00 | Stop: 2023-04-18

## 2023-04-15 MED ADMIN — enoxaparin (LOVENOX) syringe 40 mg: 40 mg | SUBCUTANEOUS | @ 22:00:00

## 2023-04-15 MED ADMIN — insulin lispro (HumaLOG) injection 3 Units: 3 [IU] | SUBCUTANEOUS | @ 18:00:00

## 2023-04-15 MED ADMIN — ascorbic acid (vitamin C) (VITAMIN C) tablet 500 mg: 500 mg | ORAL | @ 12:00:00 | Stop: 2023-04-19

## 2023-04-15 MED ADMIN — ezetimibe (ZETIA) tablet 10 mg: 10 mg | ORAL | @ 12:00:00

## 2023-04-15 MED ADMIN — zinc sulfate (ZINCATE) capsule 220 mg: 220 mg | ORAL | @ 12:00:00

## 2023-04-15 MED ADMIN — calcium carbonate (TUMS) chewable tablet 400 mg elem calcium: 400 mg | ORAL | @ 17:00:00

## 2023-04-15 MED ADMIN — sulfaSALAzine (AZULFIDINE) tablet 1,000 mg: 1000 mg | ORAL

## 2023-04-15 MED ADMIN — folic acid (FOLVITE) tablet 1 mg: 1 mg | ORAL | @ 12:00:00

## 2023-04-15 MED ADMIN — insulin lispro (HumaLOG) injection 0-20 Units: 0-20 [IU] | SUBCUTANEOUS | @ 03:00:00

## 2023-04-15 NOTE — Unmapped (Signed)
 CRANIAL NEUROSURGERY SERVICE   INPATIENT PROGRESS NOTE  Attending of Record: Salli Quarry, MD     For questions/concerns regarding patients:  Mon-Fri 6 AM-6 PM, please page Cranial Neurosurgery Service at [phone] (ICU) OR [phone] (Floor/ISCU)  Sun-Thurs 6 PM-6 AM AND weekends/holidays, please page Neurosurgery Night Float/ Inpatient Weekend Service        Brief History of Present Illness  Kenneth Fitzgerald is a 48 y.o. male with metastatic melanoma who is now sp R craniotomy for resection (3/21)    Subjective/Interval History  Passed OT. NAEON. Dc pending    Interval Imaging Reviewed  None    Neurological Assessment and Plan  **Metastasis to brain  POD11  - Na>135  - SBP<160  - dexamethasone taper over 2 weeks   - post op MRI and CT completed  - gamma tiles trial  - keppra 500mg  bidx14d  - follow up path, frozen consistent with metastatic melanoma   - oncology following for initiation of braf/mek inhibitors               - Holding per oncology  - PT/OT  - hold off on head wash today        Non-neurologic problems being addressed:  **Electrolytes  Hypocalcemia, hyperphosphatemia  - postsurgical hypoparathyroidism  - continue calcium and calcitriol  - appreciate endocrinology recs    **PreDM  - appreciate endocrinology recs  - on dex taper    **Medication-induced coagulopathy requiring further treatment   - Anticoagulant/Antiplatelet: none      Daily Maintenance:  VTE chemoprophylaxis: Lovenox  IV Fluids: none  Bowel Regimen: Senna, Miralax  Foley status: none  Nutrition: reg    Disposition: medically stable for SNF/dc    ___________________________________________________________________      Neurological Exam  EOSp  AOX3  PERRL  EOMI  FS  TML  RUE/RLE 5  LUE/LLE 5  SILT  Slight R drift    Incision cdi staples, small amount of surrounding erythema but no drainage or warmth    The patient's vitals, intake/output, labs, orders, and relevant imaging were reviewed for the last 24 hours.    Problem List  Principal Problem:    Metastasis to brain

## 2023-04-15 NOTE — Unmapped (Signed)
 Endocrine Team Follow Up   Consult Note     Consult information:  Requesting Attending Physician : Arman Filter, MD  Service Requesting Consult : Neurosurgery [facility]  Primary Care Provider: Rosine Beat, Benison Pap, DO  Impression:  Kenneth Fitzgerald is a 48 y.o. male admitted for R craniotomy s/p resection of brain mass. We have been consulted at the request of Arman Filter, MD to evaluate Kenneth Fitzgerald for hyperglycemia.     Medical Decision Making:  Diagnoses:  1. Prediabetes/Medication induced hyperglycemia. Uncontrolled   With hyperglycemia.  2. Nutrition: Complicating glycemic control. Increasing risk for both hypoglycemia and hyperglycemia.  3. Steroids. Complicating glycemic control and increasing risk for hyperglycemia.  4. Obesity. Complicating glycemic control and increasing risk for hyperglycemia.  5. Postsurgical hypothyroidism  6. 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 Papillary thyroid cancer s/p thyroidectomy    Studies reviewed 04/15/23:  Labs: BMP, POCT-BG, and Ca , phosphorus, albumin  Interpretation: Hypocalcemia. Hyperphosphatemia. Glucose mostly at inpatient targets.  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 On dexamethasone taper per neurosurgery.    Current dexamethasone taper:   2mg  q12h 3/30-4/1  2mg  daily 4/2-4/4  1mg  daily 4/4-4/6     Glucose overall at inpatient targets.    Remains hypocalcemic without symptoms.    Recommendations:    Steroid-induced hyperglycemia  Prediabetes  NPH 4u once daily in AM only with steroids  Lispro 3u TIDAC  Lispro 1:30>140 ACHS  Hypoglycemia protocol.  POCT-BG achs.  Ensure patient is on glucose precautions if patient taking nutrition by mouth. Hypocalcemia  Hyperphosphatemia  Primary hypoparathyroidism  Calcium carbonate 400 mg elemental TID AC  PTA calcitriol 0.76mcg daily  Monitor calcium, phosphorus, albumin daily  Encourage PO fluid intake for hyperphosphatemia    Hypothyroidism  Continue levothyroxine daily    Discharge planning:  In process. Will complete closer to discharge.Anticipate being able to discharge off antihyperglycemic agents.   Calcium and calcitriol dose TBD  Will arrange follow-up with Dr. Lyda Perone closer to discharge    Thank you for this consult. Discussed plan with primary team. We will continue to follow and make recommendations and place orders as appropriate.    Please page with questions or concerns: Marisue Humble, [place]: [phone]  Endocrinology Diabetes Care Team on call from 6AM - 3PM on weekdays then endocrine fellow on call: [phone] from 3PM - 6AM on weekdays and on weekends and holidays.   If APP cannot be reached, please page the endocrine fellow on call.      Subjective:  Denies perioral numbness or tingling.    Interval History:  Patient states he is doing well. Does report he is does not believe he is getting enough dietary calcium at hospital as he normally drinks a lot of milk at home which he is not doing here. Denies any nausea or vomiting    Initial HPI:  Kenneth Fitzgerald is a 48 y.o. male with past medical history of preDM, thyroid cancer, hypothyroidism, hypoparathyroidism, admitted for metastatic melanoma who is now sp R craniotomy for resection (3/21)     Diabetes History:  Patient has history of Prediabetes but is not on any medications at home       Patient is seen by  Dr. Lyda Perone at [facility] endocrinology outpatient for papillary thyroid cancer. Takes levothyroxine 375 mcg daily. Also has postsurgical hypoparathyroidism, takes calcitriol 0.25 mcg daily and aiming for 1200-1500 mg of calcium in diet.      Current Nutrition:  Active Orders   Diet    Nutrition Therapy Regular/House       ROS: As per HPI. ascorbic acid (vitamin C)  500 mg Oral BID    calcitriol  0.25 mcg Oral Daily    calcium carbonate  400 mg elem calcium Oral 3xd Meals    dexAMETHasone  2 mg Oral Q12H SCH    Followed by    Melene Muller ON 04/16/2023] dexAMETHasone  2 mg Oral Daily    Followed by    Melene Muller ON 04/18/2023] dexAMETHasone  1 mg Oral Daily    DULoxetine  30 mg Oral BID    enoxaparin (LOVENOX) injection  40 mg Subcutaneous QPM    ezetimibe  10 mg Oral Daily    flu vacc [id number] 6mos up(PF)  0.5 mL Intramuscular During hospitalization    folic acid  1 mg Oral Daily    insulin lispro  0-20 Units Subcutaneous ACHS    insulin lispro  3 Units Subcutaneous 3xd Meals    insulin NPH  4 Units Subcutaneous Daily    levETIRAcetam  500 mg Oral BID    levothyroxine  375 mcg Oral Daily    lisinopril  10 mg Oral Daily    loratadine  10 mg Oral Daily    multivitamins (ADULT)  1 tablet Oral Daily    polyethylene glycol  17 g Oral BID    senna  2 tablet Oral BID    sulfaSALAzine  1,000 mg Oral BID    zinc sulfate  220 mg Oral Daily       Current Outpatient Medications   Medication Instructions    acetaminophen (TYLENOL) 1,000 mg, 2 times a day (standard)    ALLEGRA ALLERGY 180 mg, Daily PRN    blood sugar diagnostic Strp Dispense 100 blood glucose test strips, ok to sub any brand preferred by insurance/patient, use 3x/day; dispense whatever brand matches with meter.    blood-glucose meter kit Use as instructed; dispense 1 meter, whatever is preferred by insurance    BRAFTOVI 450 mg, Oral, Daily (standard)    calcitriol (ROCALTROL) 0.25 mcg, Oral, Daily (standard)    calcium carbonate 1,500 mg (600 mg elem calcium) tablet 2 tablets, Oral, Daily PRN    dexAMETHasone (DECADRON) 6 mg, Oral, Every 12 hours    DULoxetine (CYMBALTA) 30 MG capsule 1 capsule, 2 times a day (standard)    ezetimibe (ZETIA) 10 mg, Oral, Daily (standard)    fluticasone propionate (FLONASE) 50 mcg/actuation nasal spray 1 spray, Daily (standard)    folic acid (FOLVITE) 1,000 mcg, Oral, Daily (standard)    lancets Misc Dispense 100 lancets, ok to sub any brand preferred by insurance/patient, use 3x/day    [name] 45 mg, Oral, 2 times a day (standard)    ondansetron (ZOFRAN) 8 mg, Oral, Every 8 hours PRN    sulfaSALAzine (AZULFIDINE) 1,000 mg, Oral, 2 times a day    SYNTHROID 375 mcg, Oral, Daily (standard)    zinc sulfate (ZINCATE) 220 mg, Oral, Daily (standard)           Past Medical History:   Diagnosis Date    Ankylosing spondylitis     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  Thyroidectomy       Past Surgical History:   Procedure Laterality Date    COLON SURGERY      Partial Colectomy    COLONOSCOPY      COSMETIC SURGERY      Related to excision of Cancer.    PR COLONOSCOPY FLX DX W/COLLJ SPEC WHEN PFRMD N/A 08/29/2021    Procedure: COLONOSCOPY, FLEXIBLE, PROXIMAL TO SPLENIC FLEXURE; DIAGNOSTIC, W/WO COLLECTION SPECIMEN BY BRUSH OR WASH;  Surgeon: Luanne Bras, MD;  Location: [facility] PROCED[facility];  Service: Gastroenterology    PR COLONOSCOPY W/BIOPSY SINGLE/MULTIPLE N/A 01/09/2018    Procedure: COLONOSCOPY, FLEXIBLE, PROXIMAL TO SPLENIC FLEXURE; WITH BIOPSY, SINGLE OR MULTIPLE;  Surgeon: Vonda Antigua, MD;  Location: [facility];  Service: Gastroenterology    PR COLONOSCOPY W/BIOPSY SINGLE/MULTIPLE N/A 08/24/2019    Procedure: COLONOSCOPY, FLEXIBLE, PROXIMAL TO SPLENIC FLEXURE; WITH BIOPSY, SINGLE OR MULTIPLE;  Surgeon: Leland Her, MD; Location: GI PROCEDURES [facility];  Service: Gastroenterology    PR COLONOSCOPY W/BIOPSY SINGLE/MULTIPLE N/A 08/29/2021    Procedure: COLONOSCOPY, FLEXIBLE, PROXIMAL TO SPLENIC FLEXURE; WITH BIOPSY, SINGLE OR MULTIPLE;  Surgeon: Luanne Bras, MD;  Location: [facility] PROCEDURES [facility];  Service: Gastroenterology    PR COLSC FLX WITH DIRECTED SUBMUCOSAL NJX ANY SBST N/A 08/24/2019    Procedure: COLONOSCOPY, FLEXIBLE, PROXIMAL TO SPLENIC FLEXURE; WITH DIRECTED SUBMUCOSAL INJECTION(S), ANY SUBSTANCE;  Surgeon: Leland Her, MD;  Location: GI PROCEDURES [facility];  Service: Gastroenterology    PR EXPLORATORY OF ABDOMEN N/A 01/24/2020    Procedure: EXPLORATORY LAPAROTOMY, EXPLORATORY CELIOTOMY WITH OR WITHOUT BIOPSY(S);  Surgeon: [name], MD;  Location: MAIN OR [place];  Service: Trauma    PR FREEING BOWEL ADHESION,ENTEROLYSIS N/A 01/24/2020    Procedure: ENTEROLYSIS (SEPART PROC);  Surgeon: [name], MD;  Location: MAIN OR [facility];  Service: Trauma    PR IMPLANT MESH HERNIA REPAIR/DEBRIDEMENT CLOSURE N/A 01/03/2015    Procedure: IMPLANTATION OF MESH/OTHER PROSTHES INCISION/VENTRAL HERNIA REPAIR/MESH CLOSE DEBRID NECROT SOFT TIS INFECT;  Surgeon: Romero Belling, MD;  Location: MAIN OR [place];  Service: Gastrointestinal    PR LAP, VENTRAL HERNIA REPAIR,REDUCIBLE N/A 04/07/2014    Procedure: LAPAROSCOPY, SURGICAL, REPAIR, VENTRAL, UMBILICAL, SPIGELIAN OR EPIGASTRIC HERNIA, REDUCIBLE;  Surgeon: Romero Belling, MD;  Location: MAIN OR [place];  Service: Gastrointestinal    PR NEGATIVE PRESSURE WOUND THERAPY DME >50 SQ CM N/A 01/24/2020    Procedure: NEG PRESS WOUND TX (VAC ASSIST) INCL TOPICALS, PER SESSION, TSA GREATER THAN/= 50 CM SQUARED;  Surgeon: [name], MD;  Location: MAIN OR [place];  Service: Trauma    PR REMOVAL NODES, NECK,CERV CMPLT Left 12/07/2012    Procedure: CERVICAL LYMPHADENECTOMY (COMPLETE);  Surgeon: Charlott Rakes, MD;  Location: MAIN OR [facility];  Service: Surgical Oncology    PR REMOVAL NODES,  NECK,CERV MOD RAD Right 03/29/2013    Procedure: CERVICAL LYMPHADENECTOMY (MODIFIED RADICAL NECK DISSECTION);  Surgeon: Charlott Rakes, MD;  Location: MAIN OR [facility];  Service: Surgical Oncology    PR REPAIR RECURR INCIS [facility] N/A 01/03/2015    Procedure: REPAIR RECURRENT INCISIONAL OR VENTRAL HERNIA; REDUCIBLE;  Surgeon: Romero Belling, MD;  Location: MAIN OR [facility];  Service: Gastrointestinal    PR REPAIR RECURR INCIS HERNIA,STRANG N/A 06/21/2016    Procedure: REPAIR RECURRENT INCISIONAL OR VENTRAL HERNIA; INCARCERATED OR STRANGULATED;  Surgeon: Mickle Asper, MD;  Location: MAIN OR [facility];  Service: Gastrointestinal    PR REPAIR RECURR INCIS HERNIA,STRANG N/A 01/24/2020    Procedure: REPAIR RECURRENT INCISIONAL OR VENTRAL HERNIA; INCARCERATED OR STRANGULATED;  Surgeon: [name], MD;  Location: MAIN OR [place];  Service: Trauma    PR SIGMOIDOSCOPY,FINE NEEDL BX,US GUIDED N/A 01/09/2018    Procedure: SIGMOIDOSCOPY, FLEXIBLE, W/TRANSENDOSCOPIC ULTRASOUND GUIDED NEEDLE ASPIRATION;  Surgeon: Vonda Antigua, MD;  [facility]: [facility]URES [facility];  Service: Gastroenterology    THYROID SURGERY         Family History   Problem Relation Age of Onset    Hyperthyroidism Mother     Osteoporosis Mother     Arrhythmia Mother     Squamous cell carcinoma Mother         basal cell vs squamous cell skin cancer    Arthritis Mother     Cancer Mother     Coronary artery disease Father         s/p CABG    Diabetes Father     Hypertension Father     Prostate cancer Father     Angina Father     Cancer Father     Heart disease Father     Colon cancer Paternal Grandmother     Diabetes Paternal Grandmother     Diabetes Maternal Aunt     Diabetes Paternal Uncle     Diabetes Paternal Aunt     Thyroid disease Neg Hx        Social History     Tobacco Use    Smoking status: Never     Passive exposure: Never    Smokeless tobacco: Never   Vaping Use    Vaping status: Never Used   Substance Use Topics    Alcohol use: Yes     Comment: social drinker 3 times yearly    Drug use: No       OBJECTIVE:  BP 136/87  - Pulse 73  - Temp 35.3 ??C (95.5 ??F) (Temporal)  - Resp 18  - Ht 188 cm (6' 2)  - Wt (!) 132.4 kg (291 lb 14.2 oz)  - SpO2 96%  - BMI 37.48 kg/m??   Wt Readings from Last 12 Encounters:   04/07/23 (!) 132.4 kg (291 lb 14.2 oz)   03/27/23 (!) 140.3 kg (309 lb 4.9 oz)   03/24/23 (!) 140.3 kg (309 lb 4.9 oz)   02/18/23 (!) 144 kg (317 lb 6.4 oz)   02/12/23 (!) 145.2 kg (320 lb 1.7 oz)   02/04/23 (!) 145.6 kg (321 lb)   01/30/23 (!) 146.1 kg (322 lb)   12/30/22 (!) 143.8 kg (317 lb 1.6 oz)   11/14/22 (!) 144.1 kg (317 lb 9.6 oz)   10/30/22 (!) 145.7 kg (321 lb 3.2 oz)   10/07/22 (!) (P) 143.3 kg (316 lb)   08/30/22 (!) 143.2 kg (315 lb 12.8 oz)     Physical Exam:  General: male in no apparent distress  HEENT: EOMI, sclera anicteric, negative [place]  Respiratory: No increased work of breathing  Neuro: Awake, alert, and oriented; no tremors  Psych: Normal mood and affect  Skin: No abnormal skin pigmentation    BG/insulin reviewed per EMR.   Glucose, POC   Date Value   04/14/2023 220 mg/dL (H)   16/10/9602 540 mg/dL   98/11/9145 829 mg/dL   56/21/3086 578 mg/dL   46/96/2952 841 mg/dL (H)   32/44/0102 725 mg/dL   36/64/4034 93 mg/dL   74/25/9563 875 mg/dL   64/33/2951 90 mg/dL   88/41/6606 98 mg/dL   30/16/0109 95 mg/dL   32/35/5732 94 mg/dL   20/25/4270 623 MG/DL   76/28/3151 94 MG/DL   76/16/0737 106 MG/DL        Summary of labs:  Lab Results   Component Value Date    A1C 5.8 (H) 04/06/2023    A1C 5.4 05/10/2020    A1C 5.8 (H) 01/03/2020     Lab Results   Component Value Date    GFR >= 60 03/23/2012    CREATININE 0.75 04/14/2023     Lab Results   Component Value Date    WBC 13.6 (H) 04/08/2023    HGB 12.7 (L) 04/08/2023    HCT 37.2 (L) 04/08/2023    PLT 248 04/08/2023       Lab Results   Component Value Date    NA 136 04/14/2023    K 4.0 04/14/2023    CL 95 (L) 04/14/2023    CO2 29.0 04/14/2023    BUN 19 04/14/2023    CREATININE 0.75 04/14/2023    GLU 131 04/14/2023    CALCIUM 7.7 (L) 04/14/2023    MG 2.1 04/08/2023    PHOS 6.3 (H) 04/14/2023       Lab Results   Component Value Date    BILITOT 0.3 04/04/2023    BILIDIR 0.20 01/27/2020    PROT 7.1 04/04/2023    ALBUMIN 3.4 04/14/2023    ALT 99 (H) 04/04/2023    AST 67 (H) 04/04/2023    ALKPHOS 138 (H) 04/04/2023    GGT 42 08/22/2011

## 2023-04-15 NOTE — Unmapped (Signed)
 Problem: Wound  Goal: Optimal Coping  Outcome: Progressing  Goal: Optimal Functional Ability  Outcome: Progressing  Intervention: Optimize Functional Ability  Recent Flowsheet Documentation  Taken 04/14/2023 2000 by Rayburn Ma, [place], RN  Activity Management: up ad lib  Goal: Absence of Infection Signs and Symptoms  Outcome: Progressing  Intervention: Prevent or Manage Infection  Recent Flowsheet Documentation  Taken 04/14/2023 2000 by Rayburn Ma, [place], RN  Infection Management: aseptic technique maintained  Goal: Improved Oral Intake  Outcome: Progressing  Goal: Optimal Pain Control and Function  Outcome: Progressing  Goal: Skin Health and Integrity  Outcome: Progressing  Intervention: Optimize Skin Protection  Recent Flowsheet Documentation  Taken 04/14/2023 2000 by Rayburn Ma, [place], RN  Activity Management: up ad lib  Pressure Reduction Techniques: frequent weight shift encouraged  Pressure Reduction Devices: positioning supports utilized  Skin Protection: adhesive use limited  Goal: Optimal Wound Healing  Outcome: Progressing     Problem: Adult Inpatient Plan of Care  Goal: Plan of Care Review  Outcome: Progressing  Goal: Patient-Specific Goal (Individualized)  Outcome: Progressing  Goal: Absence of Hospital-Acquired Illness or Injury  Outcome: Progressing  Intervention: Identify and Manage Fall Risk  Recent Flowsheet Documentation  Taken 04/14/2023 2000 by Rayburn Ma, [place], RN  Safety Interventions:   fall reduction program maintained   lighting adjusted for tasks/safety   low bed   nonskid shoes/slippers when out of bed  Intervention: Prevent Skin Injury  Recent Flowsheet Documentation  Taken 04/14/2023 2000 by Rayburn Ma, [place], RN  Positioning for Skin: Supine/Back  Device Skin Pressure Protection: absorbent pad utilized/changed  Skin Protection: adhesive use limited  Intervention: Prevent and Manage VTE (Venous Thromboembolism) Risk  Recent Flowsheet Documentation  Taken 04/14/2023 2200 by Rayburn Ma, [place], RN  Anti-Embolism Device Type: SCD, Knee  Anti-Embolism Device Status: Refused  Anti-Embolism Device Location: BLE  Taken 04/14/2023 2000 by Rayburn Ma, [place], RN  Anti-Embolism Device Type: SCD, Knee  Anti-Embolism Device Status: Refused  Anti-Embolism Device Location: BLE  Intervention: Prevent Infection  Recent Flowsheet Documentation  Taken 04/14/2023 2000 by Rayburn Ma, [place], RN  Infection Prevention: cohorting utilized  Goal: Optimal Comfort and Wellbeing  Outcome: Progressing  Goal: Readiness for Transition of Care  Outcome: Progressing  Goal: Rounds/Family Conference  Outcome: Progressing     Problem: Fall Injury Risk  Goal: Absence of Fall and Fall-Related Injury  Outcome: Progressing  Intervention: Promote Scientist, clinical (histocompatibility and immunogenetics) Documentation  Taken 04/14/2023 2000 by Rayburn Ma, [place], RN  Safety Interventions:   fall reduction program maintained   lighting adjusted for tasks/safety   low bed   nonskid shoes/slippers when out of bed     Problem: Skin Injury Risk Increased  Goal: Skin Health and Integrity  Outcome: Progressing  Intervention: Optimize Skin Protection  Recent Flowsheet Documentation  Taken 04/14/2023 2000 by Rayburn Ma, [place], RN  Activity Management: up ad lib  Pressure Reduction Techniques: frequent weight shift encouraged  Pressure Reduction Devices: positioning supports utilized  Skin Protection: adhesive use limited     Problem: Self-Care Deficit  Goal: Improved Ability to Complete Activities of Daily Living  Outcome: Progressing

## 2023-04-16 LAB — BASIC METABOLIC PANEL
ANION GAP: 11 mmol/L (ref 5–14)
BLOOD UREA NITROGEN: 19 mg/dL (ref 9–23)
BUN / CREAT RATIO: 24
CALCIUM: 8.5 mg/dL — ABNORMAL LOW (ref 8.7–10.4)
CHLORIDE: 102 mmol/L (ref 98–107)
CO2: 23 mmol/L (ref 20.0–31.0)
CREATININE: 0.8 mg/dL (ref 0.73–1.18)
EGFR CKD-EPI (2021) MALE: >90 mL/min/1.73m2 (ref >=60)
GLUCOSE RANDOM: 110 mg/dL (ref 70–179)
POTASSIUM: 4.2 mmol/L (ref 3.4–4.8)
SODIUM: 136 mmol/L (ref 135–145)

## 2023-04-16 LAB — PHOSPHORUS: PHOSPHORUS: 6 mg/dL — ABNORMAL HIGH (ref 2.4–5.1)

## 2023-04-16 LAB — ALBUMIN: ALBUMIN: 3.5 g/dL (ref 3.4–5.0)

## 2023-04-16 MED ADMIN — ondansetron (ZOFRAN) injection 4 mg: 4 mg | INTRAVENOUS | @ 20:00:00

## 2023-04-16 MED ADMIN — DULoxetine (CYMBALTA) DR capsule 30 mg: 30 mg | ORAL | @ 01:00:00

## 2023-04-16 MED ADMIN — DULoxetine (CYMBALTA) DR capsule 30 mg: 30 mg | ORAL | @ 12:00:00

## 2023-04-16 MED ADMIN — levETIRAcetam (KEPPRA) tablet 500 mg: 500 mg | ORAL | @ 01:00:00 | Stop: 2023-04-18

## 2023-04-16 MED ADMIN — zinc sulfate (ZINCATE) capsule 220 mg: 220 mg | ORAL | @ 12:00:00

## 2023-04-16 MED ADMIN — calcitriol (ROCALTROL) capsule 0.25 mcg: .25 ug | ORAL | @ 12:00:00

## 2023-04-16 MED ADMIN — multivitamins, therapeutic with minerals tablet 1 tablet: 1 | ORAL | @ 12:00:00

## 2023-04-16 MED ADMIN — levothyroxine (SYNTHROID) tablet 375 mcg: 375 ug | ORAL | @ 10:00:00

## 2023-04-16 MED ADMIN — calcium carbonate (TUMS) chewable tablet 400 mg elem calcium: 400 mg | ORAL | @ 23:00:00

## 2023-04-16 MED ADMIN — sulfaSALAzine (AZULFIDINE) tablet 1,000 mg: 1000 mg | ORAL | @ 12:00:00

## 2023-04-16 MED ADMIN — calcium carbonate (TUMS) chewable tablet 400 mg elem calcium: 400 mg | ORAL | @ 12:00:00

## 2023-04-16 MED ADMIN — sulfaSALAzine (AZULFIDINE) tablet 1,000 mg: 1000 mg | ORAL | @ 01:00:00

## 2023-04-16 MED ADMIN — calcium carbonate (TUMS) chewable tablet 400 mg elem calcium: 400 mg | ORAL | @ 16:00:00

## 2023-04-16 MED ADMIN — levETIRAcetam (KEPPRA) tablet 500 mg: 500 mg | ORAL | @ 12:00:00 | Stop: 2023-04-18

## 2023-04-16 MED ADMIN — ascorbic acid (vitamin C) (VITAMIN C) tablet 500 mg: 500 mg | ORAL | @ 12:00:00 | Stop: 2023-04-19

## 2023-04-16 MED ADMIN — lisinopril (PRINIVIL,ZESTRIL) tablet 10 mg: 10 mg | ORAL | @ 12:00:00

## 2023-04-16 MED ADMIN — loratadine (CLARITIN) tablet 10 mg: 10 mg | ORAL | @ 12:00:00

## 2023-04-16 MED ADMIN — insulin lispro (HumaLOG) injection 0-20 Units: 0-20 [IU] | SUBCUTANEOUS | @ 01:00:00

## 2023-04-16 MED ADMIN — dexAMETHasone (DECADRON) tablet 2 mg: 2 mg | ORAL | @ 12:00:00 | Stop: 2023-04-18

## 2023-04-16 MED ADMIN — ascorbic acid (vitamin C) (VITAMIN C) tablet 500 mg: 500 mg | ORAL | @ 01:00:00 | Stop: 2023-04-19

## 2023-04-16 MED ADMIN — folic acid (FOLVITE) tablet 1 mg: 1 mg | ORAL | @ 12:00:00

## 2023-04-16 MED ADMIN — ondansetron (ZOFRAN) injection 4 mg: 4 mg | INTRAVENOUS | @ 14:00:00 | Stop: 2023-04-16

## 2023-04-16 MED ADMIN — enoxaparin (LOVENOX) syringe 40 mg: 40 mg | SUBCUTANEOUS | @ 23:00:00

## 2023-04-16 MED ADMIN — ezetimibe (ZETIA) tablet 10 mg: 10 mg | ORAL | @ 12:00:00

## 2023-04-16 MED ADMIN — oxyCODONE (ROXICODONE) immediate release tablet 10 mg: 10 mg | ORAL | @ 20:00:00 | Stop: 2023-04-19

## 2023-04-16 NOTE — Unmapped (Signed)
 [facility]OS[facility]GERY SERVICE   INPATIENT PROGRESS NOTE  Attending of Record: Salli Quarry, MD     For questions/concerns regarding patients:  Mon-Fri 6 AM-6 PM, please page Cranial Neurosurgery Service at [phone] (ICU) OR [phone] (Floor/ISCU)  Sun-Thurs 6 PM-6 AM AND weekends/holidays, please page Neurosurgery Night Float/ Inpatient Weekend Service        Brief History of Present Illness  Kenneth Fitzgerald is a 48 y.o. male with R frontal hemorrhagic metastatic melanoma who is now s/p R craniotomy for resection and gamma tiles placement (3/21)    Subjective/Interval History  Passed PT as well. Cannot drive until cleared by medicine/driving simulation    Interval Imaging Reviewed  None    Neurological Assessment and Plan  **Metastasis to brain  POD12  - Na>135  - SBP<160  - dexamethasone taper over 2 weeks   - post op MRI and CT completed  - gamma tiles trial  - keppra 500mg  bidx14d  - follow up path, frozen consistent with metastatic melanoma   - oncology following for initiation of braf/mek inhibitors               - Holding per oncology  - PT/OT  - hold off on head wash         Non-neurologic problems being addressed:  **Electrolytes  Hypocalcemia, hyperphosphatemia  - postsurgical hypoparathyroidism  - continue calcium and calcitriol  - appreciate endocrinology recs    **PreDM  - appreciate endocrinology recs  - on dex taper    **Medication-induced coagulopathy requiring further treatment   - Anticoagulant/Antiplatelet: none      Daily Maintenance:  VTE chemoprophylaxis: Lovenox  IV Fluids: none  Bowel Regimen: Senna, Miralax  Foley status: none  Nutrition: reg    Disposition: medically stable for SNF/dc    ___________________________________________________________________      Neurological Exam  EOSp  AOX3  PERRL  EOMI  FS  TML  RUE/RLE 5  LUE/LLE 5  SILT    Incision cdi staples      The patient's vitals, intake/output, labs, orders, and relevant imaging were reviewed for the last 24 hours.    Problem List  Principal Problem:    Metastasis to brain

## 2023-04-16 NOTE — Unmapped (Signed)
 Problem: Wound  Goal: Optimal Coping  Outcome: Progressing  Goal: Optimal Functional Ability  Outcome: Progressing  Goal: Absence of Infection Signs and Symptoms  Outcome: Progressing  Goal: Improved Oral Intake  Outcome: Progressing  Goal: Optimal Pain Control and Function  Outcome: Progressing  Goal: Skin Health and Integrity  Outcome: Progressing  Goal: Optimal Wound Healing  Outcome: Progressing     Problem: Adult Inpatient Plan of Care  Goal: Plan of Care Review  Outcome: Progressing  Goal: Patient-Specific Goal (Individualized)  Outcome: Progressing  Goal: Absence of Hospital-Acquired Illness or Injury  Outcome: Progressing  Intervention: Identify and Manage Fall Risk  Recent Flowsheet Documentation  Taken 04/15/2023 2000 by Nash Mantis, RN  Safety Interventions:   low bed   lighting adjusted for tasks/safety   nonskid shoes/slippers when out of bed   fall reduction program maintained  Goal: Optimal Comfort and Wellbeing  Outcome: Progressing  Goal: Readiness for Transition of Care  Outcome: Progressing  Goal: Rounds/Family Conference  Outcome: Progressing     Problem: Fall Injury Risk  Goal: Absence of Fall and Fall-Related Injury  Outcome: Progressing  Intervention: Promote Scientist, clinical (histocompatibility and immunogenetics) Documentation  Taken 04/15/2023 2000 by Nash Mantis, RN  Safety Interventions:   low bed   lighting adjusted for tasks/safety   nonskid shoes/slippers when out of bed   fall reduction program maintained     Problem: Skin Injury Risk Increased  Goal: Skin Health and Integrity  Outcome: Progressing     Problem: Self-Care Deficit  Goal: Improved Ability to Complete Activities of Daily Living  Outcome: Progressing

## 2023-04-16 NOTE — Unmapped (Signed)
 Endocrine Team Follow Up   Consult Note     Consult information:  Requesting Attending Physician : Arman Filter, MD  Service Requesting Consult : Neurosurgery [facility])  Primary Care Provider: Rosine Beat, Benison Pap, DO  Impression:  Kenneth Fitzgerald is a 48 y.o. male admitted for R craniotomy s/p resection of brain mass. We have been consulted at the request of Arman Filter, MD to evaluate Kenneth Fitzgerald for hyperglycemia.     Medical Decision Making:  Diagnoses:  1. Prediabetes/Medication induced hyperglycemia. Uncontrolled   With hyperglycemia.  2. Nutrition: Complicating glycemic control. Increasing risk for both hypoglycemia and hyperglycemia.  3. Steroids. Complicating glycemic control and increasing risk for hyperglycemia.  4. Obesity. Complicating glycemic control and increasing risk for hyperglycemia.  5. Postsurgical hypothyroidism  6. 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 Papillary thyroid cancer s/p thyroidectomy    Studies reviewed 04/16/23:  Labs: BMP, POCT-BG, and Ca , phosphorus, albumin  Interpretation: Hypocalcemia improved. Hyperphosphatemia. Glucose mostly at inpatient targets.  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 On dexamethasone taper per neurosurgery.    Current dexamethasone taper:   2mg  q12h 3/30-4/1  2mg  daily 4/2-4/4  1mg  daily 4/4-4/6     Glucose overall at inpatient targets.   Calcium levels have improved. Would aim for lower end of normal.    Recommendations:    Steroid-induced hyperglycemia  Prediabetes  NPH 4u once daily in AM only with steroids  Lispro 3u TIDAC  Lispro 1:30>140 ACHS  Hypoglycemia protocol.  POCT-BG achs.  Ensure patient is on glucose precautions if patient taking nutrition by mouth.     Hypocalcemia  Hyperphosphatemia  Primary hypoparathyroidism  Calcium carbonate 400 mg elemental TID AC  PTA calcitriol 0.38mcg daily  Monitor calcium, phosphorus, albumin daily  Encourage PO fluid intake for hyperphosphatemia    Hypothyroidism  Continue levothyroxine daily    Discharge planning:  In process. Will complete closer to discharge.Anticipate being able to discharge off antihyperglycemic agents.   Calcium and calcitriol dose TBD  Will arrange follow-up with Dr. Lyda Perone closer to discharge    Thank you for this consult. Discussed plan with primary team. We will continue to follow and make recommendations and place orders as appropriate.    Please page Endocrine fellow on call: [phone] for any questions or concerns.       Subjective:  Denies perioral numbness or tingling.    Interval History:  Patient states he is doing well. Does report he is does not believe he is getting enough dietary calcium at hospital as he normally drinks a lot of milk at home which he is not doing here. Denies any nausea or vomiting    Initial HPI:  Kenneth Fitzgerald is a 48 y.o. male with past medical history of preDM, thyroid cancer, hypothyroidism, hypoparathyroidism, admitted for metastatic melanoma who is now sp R craniotomy for resection (3/21)     Diabetes History:  Patient has history of Prediabetes but is not on any medications at home       Patient is seen by Dr. Lyda Perone at [facility] endocrinology outpatient for papillary thyroid cancer. Takes levothyroxine 375 mcg daily. Also has postsurgical hypoparathyroidism, takes calcitriol 0.25 mcg daily and aiming for 1200-1500 mg of  calcium in diet.      Current Nutrition:  Active Orders   Diet    Nutrition Therapy Regular/House       ROS: As per HPI.     ascorbic acid (vitamin C)  500 mg Oral BID    calcitriol  0.25 mcg Oral Daily    calcium carbonate  400 mg elem calcium Oral 3xd Meals    dexAMETHasone  2 mg Oral Daily    Followed by    Melene Muller ON 04/18/2023] dexAMETHasone  1 mg Oral Daily    DULoxetine  30 mg Oral BID    enoxaparin (LOVENOX) injection  40 mg Subcutaneous QPM    ezetimibe  10 mg Oral Daily    flu vacc [id number] 6mos up(PF)  0.5 mL Intramuscular During hospitalization    folic acid  1 mg Oral Daily    insulin lispro  0-20 Units Subcutaneous ACHS    insulin lispro  3 Units Subcutaneous 3xd Meals    insulin NPH  4 Units Subcutaneous Daily    levETIRAcetam  500 mg Oral BID    levothyroxine  375 mcg Oral Daily    lisinopril  10 mg Oral Daily    loratadine  10 mg Oral Daily    multivitamins (ADULT)  1 tablet Oral Daily    polyethylene glycol  17 g Oral BID    senna  2 tablet Oral BID    sulfaSALAzine  1,000 mg Oral BID    zinc sulfate  220 mg Oral Daily       Current Outpatient Medications   Medication Instructions    acetaminophen (TYLENOL) 1,000 mg, 2 times a day (standard)    ALLEGRA ALLERGY 180 mg, Daily PRN    blood sugar diagnostic Strp Dispense 100 blood glucose test strips, ok to sub any brand preferred by insurance/patient, use 3x/day; dispense whatever brand matches with meter.    blood-glucose meter kit Use as instructed; dispense 1 meter, whatever is preferred by insurance    BRAFTOVI 450 mg, Oral, Daily (standard)    calcitriol (ROCALTROL) 0.25 mcg, Oral, Daily (standard)    calcium carbonate 1,500 mg (600 mg elem calcium) tablet 2 tablets, Oral, Daily PRN    dexAMETHasone (DECADRON) 6 mg, Oral, Every 12 hours    DULoxetine (CYMBALTA) 30 MG capsule 1 capsule, 2 times a day (standard)    ezetimibe (ZETIA) 10 mg, Oral, Daily (standard)    fluticasone propionate (FLONASE) 50 mcg/actuation nasal spray 1 spray, Daily (standard)    folic acid (FOLVITE) 1,000 mcg, Oral, Daily (standard)    lancets Misc Dispense 100 lancets, ok to sub any brand preferred by insurance/patient, use 3x/day    [name] 45 mg, Oral, 2 times a day (standard)    ondansetron (ZOFRAN) 8 mg, Oral, Every 8 hours PRN    sulfaSALAzine (AZULFIDINE) 1,000 mg, Oral, 2 times a day SYNTHROID 375 mcg, Oral, Daily (standard)    zinc sulfate (ZINCATE) 220 mg, Oral, Daily (standard)           Past Medical History:   Diagnosis Date    Ankylosing spondylitis     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  Thyroidectomy       Past Surgical History:   Procedure Laterality Date    COLON SURGERY      Partial Colectomy    COLONOSCOPY      COSMETIC SURGERY      Related to excision of Cancer.    PR COLONOSCOPY FLX DX W/COLLJ SPEC WHEN PFRMD N/A 08/29/2021    Procedure: COLONOSCOPY, FLEXIBLE, PROXIMAL TO SPLENIC FLEXURE; DIAGNOSTIC, W/WO COLLECTION SPECIMEN BY BRUSH OR WASH;  Surgeon: Luanne Bras, MD;  Location[facility];  Service: Gastroenterology    PR COLONOSCOPY W/BIOPSY SINGLE/MULTIPLE N/A 01/09/2018    Procedure: COLONOSCOPY, FLEXIBLE, PROXIMAL TO SPLENIC FLEXURE; WITH BIOPSY, SINGLE OR MULTIPLE;  Surgeon: Vonda Antigua, MD;  [facility];  Service: Gastroenterology    PR COLONOSCOPY W/BIOPSY SINGLE/MULTIPLE N/A 08/24/2019    Procedure: COLONOSCOPY, FLEXIBLE, PROXIMAL TO SPLENIC FLEXURE; WITH BIOPSY, SINGLE OR MULTIPLE;  Surgeon: Leland Her, MD;  Location: GI PROCEDURES [facility];  Service: Gastroenterology    PR COLONOSCOPY W/BIOPSY SINGLE/MULTIPLE N/A 08/29/2021    Procedure: COLONOSCOPY, FLEXIBLE, PROXIMAL TO SPLENIC FLEXURE; WITH BIOPSY, SINGLE OR MULTIPLE;  Surgeon: Luanne Bras, MD;  Location: [facility];  Service: Gastroenterology    PR COLSC FLX WITH DIRECTED SUBMUCOSAL NJX ANY SBST N/A 08/24/2019    Procedure: COLONOSCOPY, FLEXIBLE, PROXIMAL TO SPLENIC FLEXURE; WITH DIRECTED SUBMUCOSAL INJECTION(S), ANY SUBSTANCE;  Surgeon: Leland Her, MD;  Location: GI PROCEDURES [facility] [facility];  Service: Gastroenterology    PR EXPLORATORY OF ABDOMEN N/A 01/24/2020    Procedure: EXPLORATORY LAPAROTOMY, EXPLORATORY CELIOTOMY WITH OR WITHOUT BIOPSY(S);  Surgeon: [name], MD;  Location: MAIN OR [place];  Service: Trauma    PR FREEING BOWEL ADHESION,ENTEROLYSIS N/A 01/24/2020    Procedure: ENTEROLYSIS (SEPART PROC);  Surgeon: [name], MD;  Location: MAIN OR [facility];  Service: Trauma    PR IMPLANT MESH HERNIA REPAIR/DEBRIDEMENT CLOSURE N/A 01/03/2015    Procedure: IMPLANTATION OF MESH/OTHER PROSTHES INCISION/VENTRAL HERNIA REPAIR/MESH CLOSE DEBRID NECROT SOFT TIS INFECT;  Surgeon: Romero Belling, MD;  Location: MAIN OR [place];  Service: Gastrointestinal    PR LAP, VENTRAL HERNIA REPAIR,REDUCIBLE N/A 04/07/2014    Procedure: LAPAROSCOPY, SURGICAL, REPAIR, VENTRAL, UMBILICAL, SPIGELIAN OR EPIGASTRIC HERNIA, REDUCIBLE;  Surgeon: Romero Belling, MD;  Location: MAIN OR [place];  Service: Gastrointestinal    PR NEGATIVE PRESSURE WOUND THERAPY DME >50 SQ CM N/A 01/24/2020    Procedure: NEG PRESS WOUND TX (VAC ASSIST) INCL TOPICALS, PER SESSION, TSA GREATER THAN/= 50 CM SQUARED;  Surgeon: [name], MD;  Location: MAIN OR [place];  Service: Trauma    PR REMOVAL NODES, NECK,CERV CMPLT Left 12/07/2012    Procedure: CERVICAL LYMPHADENECTOMY (COMPLETE);  Surgeon: Charlott Rakes, MD;  Location: MAIN OR [facility];  Service: Surgical Oncology    PR REMOVAL NODES, NECK,CERV MOD RAD Right 03/29/2013    Procedure: CERVICAL LYMPHADENECTOMY (MODIFIED RADICAL NECK DISSECTION);  Surgeon: Charlott Rakes, MD;  Location: MAIN OR [facility];  Service: Surgical Oncology    PR REPAIR RECURR INCIS [facility] N/A 01/03/2015 Procedure: REPAIR RECURRENT INCISIONAL OR VENTRAL  HERNIA; REDUCIBLE;  Surgeon: Romero Belling, MD;  Location: MAIN OR [facility];  Service: Gastrointestinal    PR REPAIR RECURR INCIS HERNIA,STRANG N/A 06/21/2016    Procedure: REPAIR RECURRENT INCISIONAL OR VENTRAL HERNIA; INCARCERATED OR STRANGULATED;  Surgeon: Mickle Asper, MD;  Location: MAIN OR [facility];  Service: Gastrointestinal    PR REPAIR RECURR INCIS HERNIA,STRANG N/A 01/24/2020    Procedure: REPAIR RECURRENT INCISIONAL OR VENTRAL HERNIA; INCARCERATED OR STRANGULATED;  Surgeon: [name], MD;  Location: MAIN OR [place];  Service: Trauma    PR SIGMOIDOSCOPY,FINE NEEDL BX,US GUIDED N/A 01/09/2018    Procedure: SIGMOIDOSCOPY, FLEXIBLE, W/TRANSENDOSCOPIC ULTRASOUND GUIDED NEEDLE ASPIRATION;  Surgeon: Vonda Antigua, MD;  [facility]: [facility];  Service: Gastroenterology    THYROID SURGERY         Family History   Problem Relation Age of Onset    Hyperthyroidism Mother     Osteoporosis Mother     Arrhythmia Mother     Squamous cell carcinoma Mother         basal cell vs squamous cell skin cancer    Arthritis Mother     Cancer Mother     Coronary artery disease Father         s/p CABG    Diabetes Father     Hypertension Father     Prostate cancer Father     Angina Father     Cancer Father     Heart disease Father     Colon cancer Paternal Grandmother     Diabetes Paternal Grandmother     Diabetes Maternal Aunt     Diabetes Paternal Uncle     Diabetes Paternal Aunt     Thyroid disease Neg Hx        Social History     Tobacco Use    Smoking status: Never     Passive exposure: Never    Smokeless tobacco: Never   Vaping Use    Vaping status: Never Used   Substance Use Topics    Alcohol use: Yes     Comment: social drinker 3 times yearly    Drug use: No       OBJECTIVE:  BP 146/77  - Pulse 73  - Temp 35.9 ??C (96.6 ??F) (Temporal)  - Resp 18  - Ht 188 cm (6' 2)  - Wt (!) 132.4 kg (291 lb 14.2 oz)  - SpO2 100%  - BMI 37.48 kg/m??   Wt Readings from Last 12 Encounters:   04/07/23 (!) 132.4 kg (291 lb 14.2 oz)   03/27/23 (!) 140.3 kg (309 lb 4.9 oz)   03/24/23 (!) 140.3 kg (309 lb 4.9 oz)   02/18/23 (!) 144 kg (317 lb 6.4 oz)   02/12/23 (!) 145.2 kg (320 lb 1.7 oz)   02/04/23 (!) 145.6 kg (321 lb)   01/30/23 (!) 146.1 kg (322 lb)   12/30/22 (!) 143.8 kg (317 lb 1.6 oz)   11/14/22 (!) 144.1 kg (317 lb 9.6 oz)   10/30/22 (!) 145.7 kg (321 lb 3.2 oz)   10/07/22 (!) (P) 143.3 kg (316 lb)   08/30/22 (!) 143.2 kg (315 lb 12.8 oz)     Physical Exam:  General: male in no apparent distress  HEENT: EOMI, sclera anicteric  Respiratory: No increased work of breathing  Neuro: Awake, alert, and oriented; no tremors  Psych: Normal mood and affect  Skin: No abnormal skin pigmentation    BG/insulin reviewed per EMR.   Glucose, POC  Date Value   04/16/2023 88 mg/dL   16/10/9602 540 mg/dL (H)   98/11/9145 829 mg/dL   56/21/3086 578 mg/dL   46/96/2952 841 mg/dL   32/44/0102 725 mg/dL   36/64/4034 742 mg/dL (H)   59/56/3875 643 mg/dL   32/95/1884 90 mg/dL   16/60/6301 98 mg/dL   60/10/9321 95 mg/dL   55/73/2202 94 mg/dL   54/27/0623 762 MG/DL   83/15/1761 94 MG/DL   60/73/7106 269 MG/DL        Summary of labs:  Lab Results   Component Value Date    A1C 5.8 (H) 04/06/2023    A1C 5.4 05/10/2020    A1C 5.8 (H) 01/03/2020     Lab Results   Component Value Date    GFR >= 60 03/23/2012    CREATININE 0.80 04/16/2023     Lab Results   Component Value Date    WBC 13.6 (H) 04/08/2023    HGB 12.7 (L) 04/08/2023    HCT 37.2 (L) 04/08/2023    PLT 248 04/08/2023       Lab Results   Component Value Date    NA 136 04/16/2023    K 4.2 04/16/2023    CL 102 04/16/2023    CO2 23.0 04/16/2023    BUN 19 04/16/2023    CREATININE 0.80 04/16/2023    GLU 110 04/16/2023    CALCIUM 8.5 (L) 04/16/2023    MG 2.1 04/08/2023    PHOS 6.0 (H) 04/16/2023       Lab Results   Component Value Date    BILITOT 0.3 04/04/2023    BILIDIR 0.20 01/27/2020    PROT 7.1 04/04/2023    ALBUMIN 3.5 04/16/2023    ALT 99 (H) 04/04/2023    AST 67 (H) 04/04/2023    ALKPHOS 138 (H) 04/04/2023    GGT 42 08/22/2011

## 2023-04-17 LAB — BASIC METABOLIC PANEL
ANION GAP: 11 mmol/L (ref 5–14)
BLOOD UREA NITROGEN: 20 mg/dL (ref 9–23)
BUN / CREAT RATIO: 25
CALCIUM: 9.2 mg/dL (ref 8.7–10.4)
CHLORIDE: 98 mmol/L (ref 98–107)
CO2: 27 mmol/L (ref 20.0–31.0)
CREATININE: 0.81 mg/dL (ref 0.73–1.18)
EGFR CKD-EPI (2021) MALE: >90 mL/min/1.73m2 (ref >=60)
GLUCOSE RANDOM: 125 mg/dL (ref 70–179)
POTASSIUM: 4.4 mmol/L (ref 3.4–4.8)
SODIUM: 136 mmol/L (ref 135–145)

## 2023-04-17 LAB — ALBUMIN: ALBUMIN: 4.1 g/dL (ref 3.4–5.0)

## 2023-04-17 LAB — PHOSPHORUS: PHOSPHORUS: 6.1 mg/dL — ABNORMAL HIGH (ref 2.4–5.1)

## 2023-04-17 MED ADMIN — multivitamins, therapeutic with minerals tablet 1 tablet: 1 | ORAL | @ 13:00:00

## 2023-04-17 MED ADMIN — calcitriol (ROCALTROL) capsule 0.25 mcg: .25 ug | ORAL | @ 13:00:00

## 2023-04-17 MED ADMIN — lisinopril (PRINIVIL,ZESTRIL) tablet 10 mg: 10 mg | ORAL | @ 13:00:00

## 2023-04-17 MED ADMIN — ascorbic acid (vitamin C) (VITAMIN C) tablet 500 mg: 500 mg | ORAL | Stop: 2023-04-19

## 2023-04-17 MED ADMIN — gadopiclenol (ELUCIREM,VUEWAY) injection 10 mL: 10 mL | INTRAVENOUS | @ 02:00:00 | Stop: 2023-04-16

## 2023-04-17 MED ADMIN — ascorbic acid (vitamin C) (VITAMIN C) tablet 500 mg: 500 mg | ORAL | @ 13:00:00 | Stop: 2023-04-19

## 2023-04-17 MED ADMIN — levETIRAcetam (KEPPRA) tablet 500 mg: 500 mg | ORAL | Stop: 2023-04-18

## 2023-04-17 MED ADMIN — DULoxetine (CYMBALTA) DR capsule 30 mg: 30 mg | ORAL

## 2023-04-17 MED ADMIN — folic acid (FOLVITE) tablet 1 mg: 1 mg | ORAL | @ 13:00:00

## 2023-04-17 MED ADMIN — levothyroxine (SYNTHROID) tablet 375 mcg: 375 ug | ORAL | @ 10:00:00

## 2023-04-17 MED ADMIN — insulin NPH (HumuLIN,NovoLIN) injection 4 Units: 4 [IU] | SUBCUTANEOUS | @ 13:00:00 | Stop: 2023-04-17

## 2023-04-17 MED ADMIN — sulfaSALAzine (AZULFIDINE) tablet 1,000 mg: 1000 mg | ORAL | @ 13:00:00

## 2023-04-17 MED ADMIN — calcium carbonate (TUMS) chewable tablet 400 mg elem calcium: 400 mg | ORAL | @ 17:00:00 | Stop: 2023-04-17

## 2023-04-17 MED ADMIN — zinc sulfate (ZINCATE) capsule 220 mg: 220 mg | ORAL | @ 13:00:00

## 2023-04-17 MED ADMIN — calcium carbonate (TUMS) chewable tablet 400 mg elem calcium: 400 mg | ORAL | @ 13:00:00 | Stop: 2023-04-17

## 2023-04-17 MED ADMIN — sulfaSALAzine (AZULFIDINE) tablet 1,000 mg: 1000 mg | ORAL

## 2023-04-17 MED ADMIN — senna (SENOKOT) tablet 2 tablet: 2 | ORAL

## 2023-04-17 MED ADMIN — DULoxetine (CYMBALTA) DR capsule 30 mg: 30 mg | ORAL | @ 13:00:00

## 2023-04-17 MED ADMIN — insulin lispro (HumaLOG) injection 3 Units: 3 [IU] | SUBCUTANEOUS | @ 19:00:00 | Stop: 2023-04-17

## 2023-04-17 MED ADMIN — oxyCODONE (ROXICODONE) immediate release tablet 10 mg: 10 mg | ORAL | Stop: 2023-04-19

## 2023-04-17 MED ADMIN — ezetimibe (ZETIA) tablet 10 mg: 10 mg | ORAL | @ 13:00:00

## 2023-04-17 MED ADMIN — enoxaparin (LOVENOX) syringe 40 mg: 40 mg | SUBCUTANEOUS | @ 22:00:00

## 2023-04-17 MED ADMIN — dexAMETHasone (DECADRON) tablet 2 mg: 2 mg | ORAL | @ 13:00:00 | Stop: 2023-04-17

## 2023-04-17 MED ADMIN — levETIRAcetam (KEPPRA) tablet 500 mg: 500 mg | ORAL | @ 13:00:00 | Stop: 2023-04-18

## 2023-04-17 MED ADMIN — loratadine (CLARITIN) tablet 10 mg: 10 mg | ORAL | @ 13:00:00

## 2023-04-17 NOTE — Unmapped (Signed)
 [facility] NEUROSURGERY SERVICE   INPATIENT PROGRESS NOTE  Attending of Record: Salli Quarry, MD     For questions/concerns regarding patients:  Mon-Fri 6 AM-6 PM, please page Cranial Neurosurgery Service at [phone] (ICU) OR [phone] (Floor/ISCU)  Sun-Thurs 6 PM-6 AM AND weekends/holidays, please page Neurosurgery Night Float/ Inpatient Weekend Service        Brief History of Present Illness  Kenneth Fitzgerald is a 48 y.o. male with R frontal hemorrhagic metastatic melanoma who is now s/p R craniotomy for resection and gamma tiles placement (3/21)    Subjective/Interval History  Had some vomiting yesterday and HA. Images obtained, no intervention planned. Ok to [employer] today to air bnb     Interval Imaging Reviewed  CTH/MRI brain wwo 4/2 personally reviewed demonstrates stable post surgical changes and slight increase in diffuse punctate blood products, increase in left frontotemporal edema    Neurological Assessment and Plan  **Metastasis to brain  POD13  - Na>135  - SBP<160  - dexamethasone taper over 2 weeks   - post op MRI and CT completed  - gamma tiles trial  - keppra 500mg  bidx14d  - follow up path, frozen consistent with metastatic melanoma   - oncology following for initiation of braf/mek inhibitors               - Holding per oncology  - PT/OT        Non-neurologic problems being addressed:  **Electrolytes  Hypocalcemia, hyperphosphatemia  - postsurgical hypoparathyroidism  - continue calcium and calcitriol  - appreciate endocrinology recs    **PreDM  - appreciate endocrinology recs  - on dex taper    **Medication-induced coagulopathy requiring further treatment   - Anticoagulant/Antiplatelet: none      Daily Maintenance:  VTE chemoprophylaxis: Lovenox  IV Fluids: none  Bowel Regimen: Senna, Miralax  Foley status: none  Nutrition: reg    Disposition: medically stable for SNF/dc    ___________________________________________________________________      Neurological Exam  EOSp  AOX3  PERRL  EOMI  FS  TML  RUE/RLE 5  LUE/LLE 5  SILT  Very slight R drift    Incision cdi staples      The patient's vitals, intake/output, labs, orders, and relevant imaging were reviewed for the last 24 hours.    Problem List  Principal Problem:    Metastasis to brain

## 2023-04-17 NOTE — Unmapped (Signed)
 Problem: Adult Inpatient Plan of Care  Goal: Plan of Care Review  Outcome: Ongoing - Unchanged  Goal: Patient-Specific Goal (Individualized)  Outcome: Ongoing - Unchanged  Goal: Absence of Hospital-Acquired Illness or Injury  Outcome: Ongoing - Unchanged  Intervention: Identify and Manage Fall Risk  Recent Flowsheet Documentation  Taken 04/16/2023 2000 by Delsa Bern, RN  Safety Interventions:   fall reduction program maintained   low bed   lighting adjusted for tasks/safety  Intervention: Prevent Skin Injury  Recent Flowsheet Documentation  Taken 04/16/2023 2000 by Delsa Bern, RN  Positioning for Skin: Supine/Back  Goal: Optimal Comfort and Wellbeing  Outcome: Ongoing - Unchanged  Goal: Readiness for Transition of Care  Outcome: Ongoing - Unchanged  Goal: Rounds/Family Conference  Outcome: Ongoing - Unchanged     Problem: Fall Injury Risk  Goal: Absence of Fall and Fall-Related Injury  Outcome: Ongoing - Unchanged  Intervention: Promote Injury-Free Environment  Recent Flowsheet Documentation  Taken 04/16/2023 2000 by Delsa Bern, RN  Safety Interventions:   fall reduction program maintained   low bed   lighting adjusted for tasks/safety     Problem: Skin Injury Risk Increased  Goal: Skin Health and Integrity  Outcome: Ongoing - Unchanged  Intervention: Optimize Skin Protection  Recent Flowsheet Documentation  Taken 04/16/2023 2000 by Delsa Bern, RN  Pressure Reduction Techniques: frequent weight shift encouraged     Problem: Self-Care Deficit  Goal: Improved Ability to Complete Activities of Daily Living  Outcome: Ongoing - Unchanged

## 2023-04-17 NOTE — Unmapped (Signed)
 Vitals remain stable. No neuro changes. Wounds remain clean and intact- staples removed by MD. No signs of new skin breakdown. Voiding adequately. Completing ADLs independently. Safety precautions in place.     Problem: Wound  Goal: Optimal Coping  Outcome: Progressing  Goal: Optimal Functional Ability  Outcome: Progressing  Goal: Absence of Infection Signs and Symptoms  Outcome: Progressing  Goal: Improved Oral Intake  Outcome: Progressing  Goal: Optimal Pain Control and Function  Outcome: Progressing  Goal: Skin Health and Integrity  Outcome: Progressing  Goal: Optimal Wound Healing  Outcome: Progressing     Problem: Adult Inpatient Plan of Care  Goal: Plan of Care Review  Outcome: Progressing  Goal: Patient-Specific Goal (Individualized)  Outcome: Progressing  Goal: Absence of Hospital-Acquired Illness or Injury  Outcome: Progressing  Intervention: Identify and Manage Fall Risk  Recent Flowsheet Documentation  Taken 04/17/2023 0850 by Donney Dice, RN  Safety Interventions:   fall reduction program maintained   low bed  Goal: Optimal Comfort and Wellbeing  Outcome: Progressing  Goal: Readiness for Transition of Care  Outcome: Progressing  Goal: Rounds/Family Conference  Outcome: Progressing     Problem: Fall Injury Risk  Goal: Absence of Fall and Fall-Related Injury  Outcome: Progressing  Intervention: Promote Scientist, clinical (histocompatibility and immunogenetics) Documentation  Taken 04/17/2023 0850 by Donney Dice, RN  Safety Interventions:   fall reduction program maintained   low bed     Problem: Skin Injury Risk Increased  Goal: Skin Health and Integrity  Outcome: Progressing     Problem: Self-Care Deficit  Goal: Improved Ability to Complete Activities of Daily Living  Outcome: Progressing

## 2023-04-17 NOTE — Unmapped (Signed)
 Medical Oncology Consult Note    Requesting Attending Physician:  Arman Filter, MD  Service Requesting Consult: Neurosurgery [facility])  Primary Oncologist: Dr. Lendell Caprice, transitioning to Dr. Tonia Ghent Moschos     Reason for Consult: Re-consulted on Metastatic Melanoma status post Right Craniotomy for resection with headache and Vomiting post operative day 13     Assessment: Kenneth Fitzgerald is a 48 y.o. male with a medical history significant for metastatic (M1d) melanoma who was admitted for planned neurosurgical intervention now POD 13 s/p Right craniotomy for resection and Cs- 131 Gamma tile placement.    Oncology was consulted to provide further recommendations given new symptoms of Headache and vomiting     Metastatic Melanoma status post Right Craniotomy for resection and Cs- 131 Gamma tile placement ( 04/04/2023)    Medical oncology was previously consulted on 04/07/23 for evaluation of restarting targeted therapy after surgery. See consult note of 04/07/2023     From prior consult note    Kenneth Fitzgerald has a complicated oncologic history including two primaries, most recently metastatic melanoma. In the metastatic setting, he has received nivolumab x 2c which was complicated by G3 colitis. Additionally, he has been on encorafinib/binimetinib since May 2020, which has been held due to neurosurgical intervention and concern for bleeding (given already existing perilesional hemorrhage). He has also received multiple prior course of radiation to the brain, including whole brain to 30Gy in 10 fx completed 01/20/20 and 2 left frontal lesions and right cingulate gyrus lesion to 20 Gy in 1 fraction completed 05/07/22.     At this point, it would be appropriate to hold his targeted therapy for several reasons. First, he has recently had surgical resection with intraoperative cesium. Second, there may be perioperative risk for bleeding. Third, while pathology is pending, it would be worth waiting for results from the recent pathology from recent neurosurgical intervention. If this is deemed to be due to CNS progression on targeted therapy (encorafinib/binimetinib), it would be worth discussion with his outpatient melanoma specialist whether continuing with targeted therapy or whether switching to other systemic therapy (i.e. immunotherapy in a patient with history of ankylosing spondylitis and prior G3 ICI colitis) would be more appropriate    - Pathology result of brain tumor showed metastatic melanoma  - Recent episode of headache and Vomiting was evaluated with imaging with CT head 04/16/23 which showed interval increased vasogenic edema within the left frontoparietal and bilateral temporal regions  - We will discuss with our melanoma specialist on restarting the encorafenib/binimetinib which has been held prior to surgical intervention     - Also given with the use encorafenib/binimetinib there is a 10-20% risk of bleeding and 1-5 % risk of severe bleeding and fatal intracranial hemorrhage of 1% based on data available, we will reach out to surgical team to weigh in risk of bleeding prior to restarting BRAF/ MEK inhibitor      Recommendations:     - -Recent episode of headache and Vomiting was evaluated with imaging with CT head 04/16/23 which showed interval increased vasogenic edema within the left frontoparietal and bilateral temporal regions    - We will discuss with our melanoma specialist on restarting the encorafenib/binimetinib which has been held prior to surgical intervention     - Also given with the use encorafenib/binimetinib there is a 10-20% risk of bleeding and 1-5 % risk of severe bleeding and fatal intracranial hemorrhage of 1% based on data available, we will reach out to surgical team  to weigh in risk of bleeding prior to restarting BRAF/ MEK inhibitor    This patient has been seen and discussed with Dr. Meredith Mody These recommendations were discussed with the primary team.     Please contact the oncology team at [phone] with any further questions.      Jeneen Montgomery MD MPH  [facility]  PGY-4 Fellow          -------------------------------------------------------------    HPI: Kenneth Fitzgerald is a 48 y.o. male and who is being seen at the request of Arman Filter, MD for evaluation of metastatic melanoma.      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 He was also found to have melanoma of the scalp (2012), initially stage IIIb, for which he received adjuvant HD-IFN (2014) and cervical neck dissection and XRT (2015) upon recurrence. This eventually progressed with abdominal/pelvic nodal metastases, for which he received nivolumab x 2C, c/b G3 colitis. Since May 2020, he has been on encorafinib/binimetinib. Additionally, after CNS progression (2020), he has received WBRT (completed 01/20/20) and SRS to 3 lesions (05/07/22).     Based on review of MRI 01/30/23, there was concern for hemorrhage of disease in R frontal lobe, possibly orriginating from a previously treated lesion. He was admitted for planned neurosurgical resection with intraoperative brachytherapy with Cs-131 GammaTiles, which was performed on 04/04/23.    He is now now POD 13 s/p Right craniotomy for resection and Cs- 131 Gamma tile placement.      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  Thyroidectomy       Past Surgical History:   Procedure Laterality Date    COLON SURGERY      Partial Colectomy    COLONOSCOPY      COSMETIC SURGERY      Related to excision of Cancer.    PR COLONOSCOPY FLX DX W/COLLJ SPEC WHEN PFRMD N/A 08/29/2021    Procedure: COLONOSCOPY, FLEXIBLE, PROXIMAL TO SPLENIC FLEXURE; DIAGNOSTIC, W/WO COLLECTION SPECIMEN BY BRUSH OR WASH;  Surgeon: Luanne Bras, MD;  Location: [facility]ED[facility];  Service: Gastroenterology    PR COLONOSCOPY W/BIOPSY SINGLE/MULTIPLE N/A 01/09/2018    Procedure: COLONOSCOPY, FLEXIBLE, PROXIMAL TO SPLENIC FLEXURE; WITH BIOPSY, SINGLE OR MULTIPLE;  Surgeon: Vonda Antigua, MD;  [facility];  Service: Gastroenterology    PR COLONOSCOPY W/BIOPSY SINGLE/MULTIPLE N/A 08/24/2019  Procedure: COLONOSCOPY, FLEXIBLE, PROXIMAL TO SPLENIC FLEXURE; WITH BIOPSY, SINGLE OR MULTIPLE;  Surgeon: Leland Her, MD;  Location: GI PROCEDURES [facility];  Service: Gastroenterology    PR COLONOSCOPY W/BIOPSY SINGLE/MULTIPLE N/A 08/29/2021    Procedure: COLONOSCOPY, FLEXIBLE, PROXIMAL TO SPLENIC FLEXURE; WITH BIOPSY, SINGLE OR MULTIPLE;  Surgeon: Luanne Bras, MD;  Location: [facility];  Service: Gastroenterology    PR COLSC FLX WITH DIRECTED SUBMUCOSAL NJX ANY SBST N/A 08/24/2019    Procedure: COLONOSCOPY, FLEXIBLE, PROXIMAL TO SPLENIC FLEXURE; WITH DIRECTED SUBMUCOSAL INJECTION(S), ANY SUBSTANCE;  Surgeon: Leland Her, MD;  Location: [facility] PROCED[facility];  Service: Gastroenterology    PR EXPLORATORY OF ABDOMEN N/A 01/24/2020    Procedure: EXPLORATORY LAPAROTOMY, EXPLORATORY CELIOTOMY WITH OR WITHOUT BIOPSY(S);  Surgeon: [name], MD;  Location: MAIN OR [place];  Service: Trauma    PR FREEING BOWEL ADHESION,ENTEROLYSIS N/A 01/24/2020    Procedure: ENTEROLYSIS (SEPART PROC);  Surgeon: [name], MD;  Location: MAIN OR [facility];  Service: Trauma    PR IMPLANT MESH HERNIA REPAIR/DEBRIDEMENT CLOSURE N/A 01/03/2015    Procedure: IMPLANTATION OF MESH/OTHER PROSTHES INCISION/VENTRAL HERNIA REPAIR/MESH CLOSE DEBRID NECROT SOFT TIS INFECT;  Surgeon: Romero Belling, MD;  Location: MAIN OR [place];  Service: Gastrointestinal    PR LAP, VENTRAL HERNIA REPAIR,REDUCIBLE N/A 04/07/2014    Procedure: LAPAROSCOPY, SURGICAL, REPAIR, VENTRAL, UMBILICAL, SPIGELIAN OR EPIGASTRIC HERNIA, REDUCIBLE;  Surgeon: Romero Belling, MD;  Location: MAIN OR [place];  Service: Gastrointestinal    PR NEGATIVE PRESSURE WOUND THERAPY DME >50 SQ CM N/A 01/24/2020    Procedure: NEG PRESS WOUND TX (VAC ASSIST) INCL TOPICALS, PER SESSION, TSA GREATER THAN/= 50 CM SQUARED;  Surgeon: [name], MD;  Location: MAIN OR [place];  Service: Trauma    PR REMOVAL NODES, NECK,CERV CMPLT Left 12/07/2012    Procedure: CERVICAL LYMPHADENECTOMY (COMPLETE);  Surgeon: Charlott Rakes, MD;  Location: MA[facility] OR [facility];  Service: Surgical Oncology    PR REMOVAL NODES, NECK,CERV MOD RAD Right 03/29/2013    Procedure: CERVICAL LYMPHADENECTOMY (MODIFIED RADICAL NECK DISSECTION);  Surgeon: Charlott Rakes, MD;  Location: MAIN OR [facility];  Service: Surgical Oncology    PR REPAIR RECURR INCIS [facility] N/A 01/03/2015    Procedure: REPAIR RECURRENT INCISIONAL OR VENTRAL HERNIA; REDUCIBLE;  Surgeon: Romero Belling, MD;  Location: MAIN OR [facility];  Service: Gastrointestinal    PR REPAIR RECURR INCIS HERNIA,STRANG N/A 06/21/2016    Procedure: REPAIR RECURRENT INCISIONAL OR VENTRAL HERNIA; INCARCERATED OR STRANGULATED;  Surgeon: Mickle Asper, MD;  Location: MAIN OR [facility];  Service: Gastrointestinal    PR REPAIR RECURR INCIS HERNIA,STRANG N/A 01/24/2020    Procedure: REPAIR RECURRENT INCISIONAL OR VENTRAL HERNIA; INCARCERATED OR STRANGULATED;  Surgeon: [name], MD;  Location: MAIN OR [place];  Service: Trauma    PR SIGMOIDOSCOPY,FINE NEEDL BX,US GUIDED N/A 01/09/2018    Procedure: SIGMOIDOSCOPY, FLEXIBLE, W/TRANSENDOSCOPIC ULTRASOUND GUIDED NEEDLE ASPIRATION;  Surgeon: Vonda Antigua, MD;  [facility]: [facility];  Service: Gastroenterology    THYROID SURGERY         Family History   Problem Relation Age of Onset    Hyperthyroidism Mother     Osteoporosis Mother     Arrhythmia Mother     Squamous cell carcinoma Mother         basal cell vs squamous cell skin cancer    Arthritis Mother     Cancer Mother     Coronary artery disease Father  s/p CABG    Diabetes Father     Hypertension Father     Prostate cancer Father     Angina Father     Cancer Father     Heart disease Father     Colon cancer Paternal Grandmother     Diabetes Paternal Grandmother     Diabetes Maternal Aunt     Diabetes Paternal Uncle     Diabetes Paternal Aunt     Thyroid disease Neg Hx        Social History     Socioeconomic History    Marital status: Single   Tobacco Use    Smoking status: Never     Passive exposure: Never    Smokeless tobacco: Never   Vaping Use    Vaping status: Never Used   Substance and Sexual Activity    Alcohol use: Yes     Comment: social drinker 3 times yearly    Drug use: No     Social Drivers of Psychologist, prison and probation services Strain: Low Risk  (03/25/2023)    Overall Financial Resource Strain (CARDIA)     Difficulty of Paying Living Expenses: Not hard at all   Food Insecurity: No Food Insecurity (03/25/2023)    Hunger Vital Sign     Worried About Running Out of Food in the Last Year: Never true     Ran Out of Food in the Last Year: Never true   Transportation Needs: No Transportation Needs (03/25/2023)    PRAPARE - Therapist, art (Medical): No     Lack of Transportation (Non-Medical): No   Physical Activity: Inactive (11/05/2021)    Exercise Vital Sign     Days of Exercise per Week: 0 days Minutes of Exercise per Session: 0 min   Stress: Stress Concern Present (11/05/2021)    [employer] of Occupational Health - Occupational Stress Questionnaire     Feeling of Stress : To some extent   Social Connections: Moderately Isolated (11/05/2021)    Social Connection and Isolation Panel [NHANES]     Frequency of Communication with Friends and Family: More than three times a week     Frequency of Social Gatherings with Friends and Family: Once a week     Attends Religious Services: More than 4 times per year     Active Member of [employer] or Organizations: No     Attends Banker Meetings: Never     Marital Status: Never married   Housing: Low Risk  (03/25/2023)    Housing     Within the past 12 months, have you ever stayed: outside, in a car, in a tent, in an overnight shelter, or temporarily in someone else's home (i.e. couch-surfing)?: No     Are you worried about losing your housing?: No       Allergies: is allergic to compazine [prochlorperazine] and coconut.    Medications:   Meds:   ascorbic acid (vitamin C)  500 mg Oral BID    calcitriol  0.25 mcg Oral Daily    [START ON 04/18/2023] calcium carbonate  400 mg elem calcium Oral BID    [START ON 04/18/2023] dexAMETHasone  1 mg Oral Daily    DULoxetine  30 mg Oral BID    enoxaparin (LOVENOX) injection  40 mg Subcutaneous QPM    ezetimibe  10 mg Oral Daily    flu vacc [id number] 6mos up(PF)  0.5 mL Intramuscular During hospitalization  folic acid  1 mg Oral Daily    insulin lispro  0-20 Units Subcutaneous ACHS    levETIRAcetam  500 mg Oral BID    levothyroxine  375 mcg Oral Daily    lisinopril  10 mg Oral Daily    loratadine  10 mg Oral Daily    multivitamins (ADULT)  1 tablet Oral Daily    polyethylene glycol  17 g Oral BID    senna  2 tablet Oral BID    sulfaSALAzine  1,000 mg Oral BID    zinc sulfate  220 mg Oral Daily     Continuous Infusions:  PRN Meds:.acetaminophen, bisacodyl, hydrALAZINE, labetalol, morphine, ondansetron **OR** ondansetron, oxyCODONE, oxyCODONE, sodium chloride    Objective:   Vitals: Temp:  [35.8 ??C (96.4 ??F)-36.2 ??C (97.2 ??F)] 35.9 ??C (96.6 ??F)  Heart Rate:  [84-114] 114  Resp:  [17-20] 20  BP: (140-163)/(72-93) 140/93  MAP (mmHg):  [93-112] 106  SpO2:  [95 %-100 %] 100 %    Physical Exam:  BP 140/93  - Pulse 114  - Temp 35.9 ??C (96.6 ??F) (Temporal)  - Resp 20  - Ht 188 cm (6' 2)  - Wt (!) 132.4 kg (291 lb 14.2 oz)  - SpO2 100%  - BMI 37.48 kg/m??    General appearance - alert, well appearing, and in no distress   Mental status - alert, oriented to person, place, and time   Eyes - pupils equal and reactive, extraocular eye movements intact   Nose - normal and patent, no erythema, discharge or polyps   Mouth - mucous membranes moist, pharynx normal without lesions   Neck - supple   Lymphatics - no palpable lymphadenopathy   Pulmonary - Not on acute respiratory distress  Cardiovascular- Sinus tachycardia  Gastrointestinal - Non distended, non tender   Neurological - alert, oriented, normal speech, no focal findings or movement disorder noted   Musculoskeletal - no joint tenderness, deformity or swelling   Extremities - peripheral pulses normal, no pedal edema, no clubbing or cyanosis   Skin - normal coloration and turgor, no rashes, no suspicious skin lesions noted       ECOG Performance Status: 2 - Ambulatory and capable of all selfcare but unable to carry out any work activities. Up and about more than 50% of waking hours      Test Results  Lab Results   Component Value Date    WBC 13.6 (H) 04/08/2023    HGB 12.7 (L) 04/08/2023    HCT 37.2 (L) 04/08/2023    PLT 248 04/08/2023       Lab Results   Component Value Date    NA 136 04/17/2023    K 4.4 04/17/2023    CL 98 04/17/2023    CO2 27.0 04/17/2023    BUN 20 04/17/2023    CREATININE 0.81 04/17/2023    GLU 125 04/17/2023    CALCIUM 9.2 04/17/2023    MG 2.1 04/08/2023    PHOS 6.1 (H) 04/17/2023       Lab Results   Component Value Date    BILITOT 0.3 04/04/2023    BILIDIR 0.20 01/27/2020    PROT 7.1 04/04/2023    ALBUMIN 4.1 04/17/2023    ALT 99 (H) 04/04/2023    AST 67 (H) 04/04/2023    ALKPHOS 138 (H) 04/04/2023    GGT 42 08/22/2011       Lab Results   Component Value Date    INR 1.18 03/27/2023  APTT 26.0 03/27/2023         Imaging: Radiology studies were personally reviewed

## 2023-04-17 NOTE — Unmapped (Signed)
 Endocrine Team Follow Up   Consult Note     Consult information:  Requesting Attending Physician : Arman Filter, MD  Service Requesting Consult : Neurosurgery [facility])  Primary Care Provider: Rosine Beat, Benison Pap, DO  Impression:  Kenneth Fitzgerald is a 48 y.o. male admitted for R craniotomy s/p resection of brain mass. We have been consulted at the request of Arman Filter, MD to evaluate [name] for hyperglycemia.     Medical Decision Making:  Diagnoses:  1. Prediabetes/Medication induced hyperglycemia. Uncontrolled   With hyperglycemia.  2. Nutrition: Complicating glycemic control. Increasing risk for both hypoglycemia and hyperglycemia.  3. Steroids. Complicating glycemic control and increasing risk for hyperglycemia.  4. Obesity. Complicating glycemic control and increasing risk for hyperglycemia.  5. Postsurgical hypothyroidism  6. 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 Papillary thyroid cancer s/p thyroidectomy    Studies reviewed 04/17/23:  Labs: BMP, POCT-BG, and Ca , phosphorus, albumin  Interpretation: Hypocalcemia improved. Hyperphosphatemia. Glucose mostly at inpatient targets.  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 On dexamethasone taper per neurosurgery.    Current dexamethasone taper:   2mg  q12h 3/30-4/1  2mg  daily 4/2-4/4  1mg  daily 4/4-4/6     Glucose well controlled without any insulin over last 24 hrs, would discontinue scheduled insulin.   Calcium levels have normalized, will decrease to BID dosing to aim for calcium 8-8.5.    Recommendations:    Steroid-induced hyperglycemia  Prediabetes  Stop scheduled insulin as BG well controlled.   Lispro 1:30>140 ACHS  Hypoglycemia protocol.  POCT-BG achs.  Ensure patient is on glucose precautions if patient taking nutrition by mouth.     Hypocalcemia  Hyperphosphatemia  Primary hypoparathyroidism  Calcium carbonate 400 mg elemental BID AC  PTA calcitriol 0.86mcg daily  Monitor calcium, phosphorus, albumin daily  Goal calcium 8-8.5mg /dL  Encourage PO fluid intake for hyperphosphatemia    Hypothyroidism  Continue levothyroxine daily    Discharge planning:  Does not require insulin on discharge.  Patient takes enough calcium through diet at home to maintain his calcium levels. However, would recommend sending him on Calcium carbonate 400mg  BID in case  Continue calcitriol 0.25 mcg daily  Continue levothyroxine 375 mcg daily.   Message sent to scheduling team to arrange follow up with Dr. Lyda Perone- scheduled for 04/24/2023    Thank you for this consult. Discussed plan with primary team. We will sign off.    Please page Endocrine fellow on call: [phone] for any questions or concerns.       Subjective:  Denies perioral numbness or tingling.    Interval History:  Patient states he is doing well. Does report he is does not believe he is getting enough dietary calcium at hospital as he normally drinks a lot of milk at home which he is not doing here. Denies any nausea or vomiting    Initial HPI:  Kenneth Fitzgerald is a 48 y.o. male with past medical history of preDM, thyroid cancer, hypothyroidism, hypoparathyroidism, admitted for metastatic melanoma who is now sp R craniotomy for resection (3/21)     Diabetes History:  Patient has history of Prediabetes but is not on any medications at home       Patient is  seen by Dr. Lyda Perone at [facility] endocrinology outpatient for papillary thyroid cancer. Takes levothyroxine 375 mcg daily. Also has postsurgical hypoparathyroidism, takes calcitriol 0.25 mcg daily and aiming for 1200-1500 mg of calcium in diet.      Current Nutrition:  Active Orders   Diet    Nutrition Therapy Regular/House       ROS: As per HPI.     ascorbic acid (vitamin C)  500 mg Oral BID    calcitriol  0.25 mcg Oral Daily    calcium carbonate  400 mg elem calcium Oral 3xd Meals    dexAMETHasone  2 mg Oral Daily    Followed by    Melene Muller ON 04/18/2023] dexAMETHasone  1 mg Oral Daily    DULoxetine  30 mg Oral BID    enoxaparin (LOVENOX) injection  40 mg Subcutaneous QPM    ezetimibe  10 mg Oral Daily    flu vacc [id number] 6mos up(PF)  0.5 mL Intramuscular During hospitalization    folic acid  1 mg Oral Daily    insulin lispro  0-20 Units Subcutaneous ACHS    insulin lispro  3 Units Subcutaneous 3xd Meals    insulin NPH  4 Units Subcutaneous Daily    levETIRAcetam  500 mg Oral BID    levothyroxine  375 mcg Oral Daily    lisinopril  10 mg Oral Daily    loratadine  10 mg Oral Daily    multivitamins (ADULT)  1 tablet Oral Daily    polyethylene glycol  17 g Oral BID    senna  2 tablet Oral BID    sulfaSALAzine  1,000 mg Oral BID    zinc sulfate  220 mg Oral Daily       Current Outpatient Medications   Medication Instructions    acetaminophen (TYLENOL) 1,000 mg, 2 times a day (standard)    ALLEGRA ALLERGY 180 mg, Daily PRN    blood sugar diagnostic Strp Dispense 100 blood glucose test strips, ok to sub any brand preferred by insurance/patient, use 3x/day; dispense whatever brand matches with meter.    blood-glucose meter kit Use as instructed; dispense 1 meter, whatever is preferred by insurance    BRAFTOVI 450 mg, Oral, Daily (standard)    calcitriol (ROCALTROL) 0.25 mcg, Oral, Daily (standard)    calcium carbonate 1,500 mg (600 mg elem calcium) tablet 2 tablets, Oral, Daily PRN    dexAMETHasone (DECADRON) 6 mg, Oral, Every 12 hours    DULoxetine (CYMBALTA) 30 MG capsule 1 capsule, 2 times a day (standard)    ezetimibe (ZETIA) 10 mg, Oral, Daily (standard)    fluticasone propionate (FLONASE) 50 mcg/actuation nasal spray 1 spray, Daily (standard)    folic acid (FOLVITE) 1,000 mcg, Oral, Daily (standard)    lancets Misc Dispense 100 lancets, ok to sub any brand preferred by insurance/patient, use 3x/day    [name] 45 mg, Oral, 2 times a day (standard)    ondansetron (ZOFRAN) 8 mg, Oral, Every 8 hours PRN    sulfaSALAzine (AZULFIDINE) 1,000 mg, Oral, 2 times a day    SYNTHROID 375 mcg, Oral, Daily (standard)    zinc sulfate (ZINCATE) 220 mg, Oral, Daily (standard)           Past Medical History:   Diagnosis Date    Ankylosing spondylitis     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  Thyroidectomy       Past Surgical History:   Procedure Laterality Date    COLON SURGERY      Partial Colectomy    COLONOSCOPY      COSMETIC SURGERY      Related to excision of Cancer.    PR COLONOSCOPY FLX DX W/COLLJ SPEC WHEN PFRMD N/A 08/29/2021    Procedure: COLONOSCOPY, FLEXIBLE, PROXIMAL TO SPLENIC FLEXURE; DIAGNOSTIC, W/WO COLLECTION SPECIMEN BY BRUSH OR WASH;  Surgeon: Luanne Bras, MD;  [facility];  Service: Gastroenterology    PR COLONOSCOPY W/BIOPSY SINGLE/MULTIPLE N/A 01/09/2018    Procedure: COLONOSCOPY, FLEXIBLE, PROXIMAL TO SPLENIC FLEXURE; WITH BIOPSY, SINGLE OR MULTIPLE;  Surgeon: Vonda Antigua, MD;  [facility];  Service: Gastroenterology    PR COLONOSCOPY W/BIOPSY SINGLE/MULTIPLE N/A 08/24/2019    Procedure: COLONOSCOPY, FLEXIBLE, PROXIMAL TO SPLENIC FLEXURE; WITH BIOPSY, SINGLE OR MULTIPLE;  Surgeon: Leland Her, MD;  Location: [facility] PROCED[facility];  Service: Gastroenterology    PR COLONOSCOPY W/BIOPSY SINGLE/MULTIPLE N/A 08/29/2021    Procedure: COLONOSCOPY, FLEXIBLE, PROXIMAL TO SPLENIC FLEXURE; WITH BIOPSY, SINGLE OR MULTIPLE;  Surgeon: Luanne Bras, MD;  Location: [facility];  Service: Gastroenterology    PR COLSC FLX WITH DIRECTED SUBMUCOSAL NJX ANY SBST N/A 08/24/2019    Procedure: COLONOSCOPY, FLEXIBLE, PROXIMAL TO SPLENIC FLEXURE; WITH DIRECTED SUBMUCOSAL INJECTION(S), ANY SUBSTANCE;  Surgeon: Leland Her, MD;  Location: GI PROCEDURES [facility];  Service: Gastroenterology    PR EXPLORATORY OF ABDOMEN N/A 01/24/2020    Procedure: EXPLORATORY LAPAROTOMY, EXPLORATORY CELIOTOMY WITH OR WITHOUT BIOPSY(S);  Surgeon: [name], MD;  Location: MAIN OR [place];  Service: Trauma    PR FREEING BOWEL ADHESION,ENTEROLYSIS N/A 01/24/2020    Procedure: ENTEROLYSIS (SEPART PROC);  Surgeon: [name], MD;  Location: MAIN OR [facility];  Service: Trauma    PR IMPLANT MESH HERNIA REPAIR/DEBRIDEMENT CLOSURE N/A 01/03/2015    Procedure: IMPLANTATION OF MESH/OTHER PROSTHES INCISION/VENTRAL HERNIA REPAIR/MESH CLOSE DEBRID NECROT SOFT TIS INFECT;  Surgeon: Romero Belling, MD;  Location: MAIN OR [place];  Service: Gastrointestinal    PR LAP, VENTRAL HERNIA REPAIR,REDUCIBLE N/A 04/07/2014    Procedure: LAPAROSCOPY, SURGICAL, REPAIR, VENTRAL, UMBILICAL, SPIGELIAN OR EPIGASTRIC HERNIA, REDUCIBLE;  Surgeon: Romero Belling, MD;  Location: MAIN OR [place];  Service: Gastrointestinal    PR NEGATIVE PRESSURE WOUND THERAPY DME >50 SQ CM N/A 01/24/2020    Procedure: NEG PRESS WOUND TX (VAC ASSIST) INCL TOPICALS, PER SESSION, TSA GREATER THAN/= 50 CM SQUARED;  Surgeon: [name], MD;  Location: MAIN OR [place];  Service: Trauma    PR REMOVAL NODES, NECK,CERV CMPLT Left 12/07/2012    Procedure: CERVICAL LYMPHADENECTOMY (COMPLETE);  Surgeon: Charlott Rakes, MD;  Location: [facility] OR [facility];  Service: Surgical Oncology    PR REMOVAL NODES, NECK,CERV MOD RAD Right 03/29/2013    Procedure: CERVICAL LYMPHADENECTOMY (  MODIFIED RADICAL NECK DISSECTION);  Surgeon: Charlott Rakes, MD;  Location: MAIN OR [facility];  Service: Surgical Oncology    PR REPAIR RECURR INCIS [facility] N/A 01/03/2015    Procedure: REPAIR RECURRENT INCISIONAL OR VENTRAL HERNIA; REDUCIBLE;  Surgeon: Romero Belling, MD;  Location: MAIN OR [facility];  Service: Gastrointestinal    PR REPAIR RECURR INCIS HERNIA,STRANG N/A 06/21/2016    Procedure: REPAIR RECURRENT INCISIONAL OR VENTRAL HERNIA; INCARCERATED OR STRANGULATED;  Surgeon: Mickle Asper, MD;  Location: MAIN OR [facility];  Service: Gastrointestinal    PR REPAIR RECURR INCIS HERNIA,STRANG N/A 01/24/2020    Procedure: REPAIR RECURRENT INCISIONAL OR VENTRAL HERNIA; INCARCERATED OR STRANGULATED;  Surgeon: [name], MD;  Location: MAIN OR [place];  Service: Trauma    PR SIGMOIDOSCOPY,FINE NEEDL BX,US GUIDED N/A 01/09/2018    Procedure: SIGMOIDOSCOPY, FLEXIBLE, W/TRANSENDOSCOPIC ULTRASOUND GUIDED NEEDLE ASPIRATION;  Surgeon: Vonda Antigua, MD;  Location: [facility];  Service: Gastroenterology    THYROID SURGERY         Family History   Problem Relation Age of Onset    Hyperthyroidism Mother     Osteoporosis Mother     Arrhythmia Mother     Squamous cell carcinoma Mother         basal cell vs squamous cell skin cancer    Arthritis Mother     Cancer Mother     Coronary artery disease Father         s/p CABG    Diabetes Father     Hypertension Father     Prostate cancer Father     Angina Father     Cancer Father     Heart disease Father     Colon cancer Paternal Grandmother     Diabetes Paternal Grandmother     Diabetes Maternal Aunt     Diabetes Paternal Uncle     Diabetes Paternal Aunt     Thyroid disease Neg Hx        Social History     Tobacco Use    Smoking status: Never     Passive exposure: Never    Smokeless tobacco: Never   Vaping Use    Vaping status: Never Used   Substance Use Topics    Alcohol use: Yes     Comment: social drinker 3 times yearly    Drug use: No       OBJECTIVE:  BP 163/79  - Pulse 84  - Temp 36 ??C (96.8 ??F) (Temporal)  - Resp 17  - Ht 188 cm (6' 2)  - Wt (!) 132.4 kg (291 lb 14.2 oz)  - SpO2 99%  - BMI 37.48 kg/m??   Wt Readings from Last 12 Encounters:   04/07/23 (!) 132.4 kg (291 lb 14.2 oz)   03/27/23 (!) 140.3 kg (309 lb 4.9 oz)   03/24/23 (!) 140.3 kg (309 lb 4.9 oz)   02/18/23 (!) 144 kg (317 lb 6.4 oz)   02/12/23 (!) 145.2 kg (320 lb 1.7 oz)   02/04/23 (!) 145.6 kg (321 lb)   01/30/23 (!) 146.1 kg (322 lb)   12/30/22 (!) 143.8 kg (317 lb 1.6 oz)   11/14/22 (!) 144.1 kg (317 lb 9.6 oz)   10/30/22 (!) 145.7 kg (321 lb 3.2 oz)   10/07/22 (!) (P) 143.3 kg (316 lb)   08/30/22 (!) 143.2 kg (315 lb 12.8 oz)     Physical Exam:  General: male in no apparent distress  HEENT: EOMI, sclera anicteric  Respiratory: No increased work of breathing  Neuro: Awake, alert, and oriented; no tremors  Psych: Normal mood and affect  Skin: No abnormal skin pigmentation    BG/insulin reviewed per EMR.   Glucose, POC   Date Value   04/16/2023 109 mg/dL   16/10/9602 540 mg/dL   98/11/9145 83 mg/dL   82/95/6213 88 mg/dL   08/65/7846 962 mg/dL (H)   95/28/4132 440 mg/dL   11/10/2534 644 mg/dL   03/47/4259 563 mg/dL   87/56/4332 90 mg/dL   95/18/8416 98 mg/dL   60/63/0160 95 mg/dL   10/93/2355 94 mg/dL   73/22/0254 270 MG/DL   62/37/6283 94 MG/DL   15/17/6160 737 MG/DL        Summary of labs:  Lab Results   Component Value Date    A1C 5.8 (H) 04/06/2023    A1C 5.4 05/10/2020    A1C 5.8 (H) 01/03/2020     Lab Results   Component Value Date    GFR >= 60 03/23/2012    CREATININE 0.81 04/17/2023     Lab Results   Component Value Date    WBC 13.6 (H) 04/08/2023    HGB 12.7 (L) 04/08/2023    HCT 37.2 (L) 04/08/2023    PLT 248 04/08/2023       Lab Results   Component Value Date    NA 136 04/17/2023    K 4.4 04/17/2023    CL 98 04/17/2023    CO2 27.0 04/17/2023    BUN 20 04/17/2023    CREATININE 0.81 04/17/2023    GLU 125 04/17/2023    CALCIUM 9.2 04/17/2023    MG 2.1 04/08/2023 PHOS 6.1 (H) 04/17/2023       Lab Results   Component Value Date    BILITOT 0.3 04/04/2023    BILIDIR 0.20 01/27/2020    PROT 7.1 04/04/2023    ALBUMIN 4.1 04/17/2023    ALT 99 (H) 04/04/2023    AST 67 (H) 04/04/2023    ALKPHOS 138 (H) 04/04/2023    GGT 42 08/22/2011

## 2023-04-17 NOTE — Unmapped (Signed)
 [facility] Consult Service - Initial Consult Note    Consulting Team: NSG  Consulting Provider: Loni Rm, MD  Date: 04/17/23    Patient Name: Kenneth Fitzgerald [facility]  Primary Cancer: Skin  Reason(s) for Consult:  Neurologic progression    Current Steroid Regimen: 3mg  dex daily    Radiotherapy Screening Questions:  Prior Radiation Therapy: yes, extensive  Pacemaker: no  Pregnancy status: No; male patient    Brief Assessment: 48 yo man with a history of recurrent thyroid cancer treated with RAI and melanoma. He underwent resection of Stage III scalp melanoma in 2012 (pT2N2) followed by R neck dissection with 1/20LN+ in 2015 s/p EBRT (48Gy). Developed brain metastases in 2020, s/p WBRT 01/20/2020 then SRS to 3 lesions 05/07/2022, and GammaTile placement with resection of hemorrhagic R frontal metastasis on 04/04/23. Radiation oncology consulted for opinion regarding widespread intracranial progression    I reviewed the role of radiation therapy in the management of brain metastases with the patient as well as his brother Marijean Shouts who was joining us  by speaker phone.  Unfortunately the patient has demonstrated widespread intracranial progression of his metastatic disease across many sites including new sites.  This is all in the context of multiple prior courses of radiation therapy to the brain including whole brain radiation therapy in 2022, SRS to multiple sites in 2024, and most recently intracranial brachytherapy with surgical resection only a couple weeks ago.  Given prior radiation exposure and the extent of intracranial progression, the additional role of further radiotherapy is unclear at this time.  I do suspect that the patient's symptoms are related to intracranial progression as opposed to toxicity associated with recent brachytherapy procedure.  The patient and his brother were understanding of our recommendations.  I will share the details of this discussion with the patient's primary radiation oncologist who can provide additional input.    Recommendations:     Consider increasing dexamethasone if symptomatically progressing  No role for additional radiotherapy at this time    Please page the Inpatient/Urgent Radiation Oncology Consult Team Monday-Friday, 8a-4p with questions. For after hours or weekend emergencies, please page the on call resident.    ----  Pertinent history, including data review: Having met the patient before I am familiar with his past oncologic history which is documented in detail elsewhere in the medical record.  I reviewed the details of his recent brachytherapy procedure and postoperative course.  Unfortunately over the last couple of days he has noted increased nausea and vomiting, headaches, as well as word finding difficulty.  His brother Marijean Shouts who joined us  by telephone has noted decreased mental status and difficulty with communicating on certain aspects of their conversation.  He denies changes in vision, changes in hearing, dizziness, loss of coordination, weakness, weight loss, fever/chills.  Headaches are mild and he does not demonstrate significant pain near the surgical site nor does he demonstrate or complaint of any drainage or bleeding.    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  Thyroidectomy        I have reviewed old/outside medical records (please see above for summary).    Medications: Reviewed    Allergies:  Allergies   Allergen Reactions    Compazine [Prochlorperazine] Itching    Coconut Nausea And Vomiting       Family History:  family history includes Angina in his father; Arrhythmia in his mother; Arthritis in his mother; Cancer in his father and mother; Colon cancer in his paternal grandmother; Coronary artery disease in his father; Diabetes in his father, maternal aunt, paternal aunt, paternal grandmother, and paternal uncle; Heart disease in his father; Hypertension in his father; Hyperthyroidism in his mother; Osteoporosis in his mother; Prostate cancer in his father; Squamous cell carcinoma in his mother.    Social History:  Reviewed      Physical Examination  ECOG: 2 = Ambulatory and capable of all selfcare but unable to carry out any work activities. Up and about more than 50% of waking hours  General: Lying in left lateral decubitus position, arms over his head.  Appears somewhat uncomfortable but not in acute distress.  Notably the room close no warmer than 60 ??F and the patient was wearing shorts and short sleeves  Pertinent PE: Moving all 4 extremities spontaneously, able to contribute meaningful details of his history.  Vocabulary as expected.  Sensation intact to light touch to bilateral upper extremity    Glenna Lango, MD, MS    Attending Attestation    I was immediately available via phone/pager or present on site.  I reviewed and discussed the case with the resident, but did not see the patient.  I agree with the assessment and plan as documented in the resident's note.    Shylee Durrett C. Jeannett Million, MD  Assistant Professor  Department of Radiation Oncology  [facility]  [id number]  [place], [place] [address]  O: [phone]  04/21/23 12:15 PM

## 2023-04-17 NOTE — Unmapped (Signed)
 Patient would benefit from a rollator at home and:   ?? Has a mobility limitation that significantly impairs his ability to participate in one or more mobility-related activities of daily living (MRADLs)   ?? Has a mobility limitation that place the patient at reasonably determined heightened risk of morbidity or mortality secondary to the attempts to perform the MRADL and prevents the patient from completing the MRADL within a reasonable time frame.   ??The patient is able to safely use the rollator.   ??The functional mobility deficit can be sufficiently resolved with the use of a rollator.

## 2023-04-18 LAB — BASIC METABOLIC PANEL
ANION GAP: 14 mmol/L (ref 5–14)
BLOOD UREA NITROGEN: 25 mg/dL — ABNORMAL HIGH (ref 9–23)
BUN / CREAT RATIO: 29
CALCIUM: 8.1 mg/dL — ABNORMAL LOW (ref 8.7–10.4)
CHLORIDE: 99 mmol/L (ref 98–107)
CO2: 26 mmol/L (ref 20.0–31.0)
CREATININE: 0.86 mg/dL (ref 0.73–1.18)
EGFR CKD-EPI (2021) MALE: >90 mL/min/1.73m2 (ref >=60)
GLUCOSE RANDOM: 124 mg/dL (ref 70–179)
POTASSIUM: 3.8 mmol/L (ref 3.4–4.8)
SODIUM: 139 mmol/L (ref 135–145)

## 2023-04-18 LAB — HLA DISEASE ASSOCIATED WORKUP

## 2023-04-18 LAB — ALBUMIN: ALBUMIN: 3.7 g/dL (ref 3.4–5.0)

## 2023-04-18 LAB — PHOSPHORUS: PHOSPHORUS: 4.7 mg/dL (ref 2.4–5.1)

## 2023-04-18 MED ORDER — INSULIN NPH ISOPHANE U-100 HUMAN 100 UNIT/ML SUBCUTANEOUS SUSPENSION
Freq: Every day | SUBCUTANEOUS | 0 refills | 30 days | Status: CP
Start: 2023-04-18 — End: ?

## 2023-04-18 MED ORDER — ONDANSETRON 4 MG DISINTEGRATING TABLET
ORAL_TABLET | Freq: Three times a day (TID) | ORAL | 0 refills | 7 days | Status: CN | PRN
Start: 2023-04-18 — End: 2023-04-25

## 2023-04-18 MED ORDER — ASCORBIC ACID (VITAMIN C) 500 MG TABLET
ORAL_TABLET | Freq: Two times a day (BID) | ORAL | 0 refills | 30 days | Status: CP
Start: 2023-04-18 — End: 2023-04-18

## 2023-04-18 MED ORDER — OXYCODONE 5 MG TABLET
ORAL_TABLET | ORAL | 0 refills | 5 days | Status: CN | PRN
Start: 2023-04-18 — End: 2023-04-23

## 2023-04-18 MED ORDER — SENNOSIDES 8.6 MG TABLET
ORAL_TABLET | Freq: Two times a day (BID) | ORAL | 0 refills | 30 days | Status: CP
Start: 2023-04-18 — End: 2023-04-18

## 2023-04-18 MED ORDER — ACETAMINOPHEN 325 MG TABLET
ORAL_TABLET | Freq: Four times a day (QID) | ORAL | 0 refills | 8 days | Status: CP | PRN
Start: 2023-04-18 — End: 2023-04-18

## 2023-04-18 MED ORDER — CALCIUM 200 MG (AS CALCIUM CARBONATE 500 MG) CHEWABLE TABLET
ORAL_TABLET | Freq: Two times a day (BID) | ORAL | 0 refills | 30 days | Status: CN
Start: 2023-04-18 — End: 2023-05-18

## 2023-04-18 MED ORDER — POLYETHYLENE GLYCOL 3350 17 GRAM/DOSE ORAL POWDER
Freq: Two times a day (BID) | ORAL | 0 refills | 30 days | Status: CP
Start: 2023-04-18 — End: 2023-04-18

## 2023-04-18 MED ORDER — DEXAMETHASONE 1 MG TABLET
ORAL_TABLET | Freq: Two times a day (BID) | ORAL | 0 refills | 30 days | Status: CP
Start: 2023-04-18 — End: 2023-04-18

## 2023-04-18 MED ADMIN — sulfaSALAzine (AZULFIDINE) tablet 1,000 mg: 1000 mg | ORAL

## 2023-04-18 MED ADMIN — loratadine (CLARITIN) tablet 10 mg: 10 mg | ORAL | @ 12:00:00 | Stop: 2023-04-18

## 2023-04-18 MED ADMIN — folic acid (FOLVITE) tablet 1 mg: 1 mg | ORAL | @ 12:00:00 | Stop: 2023-04-18

## 2023-04-18 MED ADMIN — sulfaSALAzine (AZULFIDINE) tablet 1,000 mg: 1000 mg | ORAL | @ 12:00:00 | Stop: 2023-04-18

## 2023-04-18 MED ADMIN — levETIRAcetam (KEPPRA) tablet 500 mg: 500 mg | ORAL | Stop: 2023-04-18

## 2023-04-18 MED ADMIN — levothyroxine (SYNTHROID) tablet 375 mcg: 375 ug | ORAL | @ 09:00:00 | Stop: 2023-04-18

## 2023-04-18 MED ADMIN — ezetimibe (ZETIA) tablet 10 mg: 10 mg | ORAL | @ 12:00:00 | Stop: 2023-04-18

## 2023-04-18 MED ADMIN — calcitriol (ROCALTROL) capsule 0.25 mcg: .25 ug | ORAL | @ 12:00:00 | Stop: 2023-04-18

## 2023-04-18 MED ADMIN — multivitamins, therapeutic with minerals tablet 1 tablet: 1 | ORAL | @ 12:00:00 | Stop: 2023-04-18

## 2023-04-18 MED ADMIN — dexAMETHasone (DECADRON) tablet 1 mg: 1 mg | ORAL | @ 12:00:00 | Stop: 2023-04-18

## 2023-04-18 MED ADMIN — DULoxetine (CYMBALTA) DR capsule 30 mg: 30 mg | ORAL | @ 12:00:00 | Stop: 2023-04-18

## 2023-04-18 MED ADMIN — zinc sulfate (ZINCATE) capsule 220 mg: 220 mg | ORAL | @ 12:00:00 | Stop: 2023-04-18

## 2023-04-18 MED ADMIN — calcium carbonate (TUMS) chewable tablet 400 mg elem calcium: 400 mg | ORAL | @ 12:00:00 | Stop: 2023-04-18

## 2023-04-18 MED ADMIN — ascorbic acid (vitamin C) (VITAMIN C) tablet 500 mg: 500 mg | ORAL | @ 12:00:00 | Stop: 2023-04-18

## 2023-04-18 MED ADMIN — DULoxetine (CYMBALTA) DR capsule 30 mg: 30 mg | ORAL

## 2023-04-18 MED ADMIN — ascorbic acid (vitamin C) (VITAMIN C) tablet 500 mg: 500 mg | ORAL | Stop: 2023-04-19

## 2023-04-18 MED ADMIN — levETIRAcetam (KEPPRA) tablet 500 mg: 500 mg | ORAL | @ 12:00:00 | Stop: 2023-04-18

## 2023-04-18 MED ADMIN — lisinopril (PRINIVIL,ZESTRIL) tablet 10 mg: 10 mg | ORAL | @ 12:00:00 | Stop: 2023-04-18

## 2023-04-18 MED FILL — BRAFTOVI 75 MG CAPSULE: ORAL | 30 days supply | Qty: 180 | Fill #4

## 2023-04-18 NOTE — Unmapped (Addendum)
 [facility] Refill Coordination Note    Specialty Medication(s) to be Shipped:   Hematology/Oncology: Braftovi    Other medication(s) to be shipped: No additional medications requested for fill at this time     Kenneth Fitzgerald, DOB: 1975-09-06  Phone: [phone] (home)       All above HIPAA information was verified with patient's family member, Sister.     Was a Nurse, learning disability used for this call? No    Completed refill call assessment today to schedule patient's medication shipment from the [facility]  [phone]).  All relevant notes have been reviewed.     Specialty medication(s) and dose(s) confirmed: Regimen is correct and unchanged.   Changes to medications: Kenneth Fitzgerald reports no changes at this time.  Changes to insurance: No  New side effects reported not previously addressed with a pharmacist or physician: None reported  Questions for the pharmacist: No    Confirmed patient received a Conservation officer, historic buildings and a Surveyor, mining with first shipment. The patient will receive a drug information handout for each medication shipped and additional FDA Medication Guides as required.       DISEASE/MEDICATION-SPECIFIC INFORMATION        N/A    SPECIALTY MEDICATION ADHERENCE     Medication Adherence    Patient reported X missed doses in the last month: 1-2  Specialty Medication: BRAFTOVI 75 mg capsule  Patient is on additional specialty medications: Yes  Additional Specialty Medications: [name] 15 mg tablet   Patient Reported Additional Medication X Missed Doses in the Last Month: 0  Patient is on more than two specialty medications: No              Were doses missed due to medication being on hold? No    Braftovi 75 mg: 0 doses of medicine on hand        REFERRAL TO PHARMACIST     Referral to the pharmacist: Not needed      SHIPPING     Shipping address confirmed in Epic.     Cost and Payment: Patient has a $0 copay, payment information is not required.    Delivery Scheduled: Yes, Expected medication delivery date: 04/21/23.     Medication will be delivered via UPS to the temporary address in Epic WAM.    Kenneth Fitzgerald   [facility]  Specialty Technician

## 2023-04-18 NOTE — Unmapped (Signed)
 Problem: Wound  Goal: Optimal Coping  Outcome: Ongoing - Unchanged  Goal: Optimal Functional Ability  Outcome: Ongoing - Unchanged  Goal: Absence of Infection Signs and Symptoms  Outcome: Ongoing - Unchanged  Intervention: Prevent or Manage Infection  Recent Flowsheet Documentation  Taken 04/17/2023 2000 by [place], Dois Davenport, RN  Infection Management: aseptic technique maintained  Goal: Improved Oral Intake  Outcome: Ongoing - Unchanged  Goal: Optimal Pain Control and Function  Outcome: Ongoing - Unchanged  Goal: Skin Health and Integrity  Outcome: Ongoing - Unchanged  Goal: Optimal Wound Healing  Outcome: Ongoing - Unchanged     Problem: Adult Inpatient Plan of Care  Goal: Plan of Care Review  Outcome: Ongoing - Unchanged  Goal: Patient-Specific Goal (Individualized)  Outcome: Ongoing - Unchanged  Goal: Absence of Hospital-Acquired Illness or Injury  Outcome: Ongoing - Unchanged  Intervention: Identify and Manage Fall Risk  Recent Flowsheet Documentation  Taken 04/17/2023 2000 by [place], Dois Davenport, RN  Safety Interventions:   infection management   fall reduction program maintained   nonskid shoes/slippers when out of bed  Intervention: Prevent Infection  Recent Flowsheet Documentation  Taken 04/17/2023 2000 by [place], Dois Davenport, RN  Infection Prevention:   hand hygiene promoted   personal protective equipment utilized   rest/sleep promoted  Goal: Optimal Comfort and Wellbeing  Outcome: Ongoing - Unchanged  Goal: Readiness for Transition of Care  Outcome: Ongoing - Unchanged  Goal: Rounds/Family Conference  Outcome: Ongoing - Unchanged

## 2023-04-18 NOTE — Unmapped (Signed)
 NEUROSURGERY DISCHARGE SUMMARY    Identifying Information:   Kenneth Fitzgerald [facility]  September 11, 1975  [id number]    Admit date: 04/04/2023    Discharge date: 04/18/2023     Discharge Service: Neurosurgery [facility])    Discharge Attending Physician: Arman Filter, MD    Discharge to: Assisted Living Facility    Discharge Diagnoses:  Principal Problem:    Metastasis to brain      Hospital Course:   Kenneth Fitzgerald is a 48 y.o. male with a history of R frontal hemorrhagic metastatic melanoma who is now s/p R craniotomy for resection and gamma tiles placement (3/21).  They were seen and consented for surgical intervention after discussing the risks, benefits, and alternatives in full detail.    The patient was taken to the OR on 04/04/2023 for a R craniotomy for resection and gamma tiles placement.  He tolerated the procedure well, was extubated in the OR, and was taken to the NSICU for further neuromonitoring and was then eventually transferred to the floor. He did well postoperatively. His diet was slowly advanced and at the time of discharge he was tolerating a regular diet. The patient was able to void spontaneously, have his pain controlled with P.O. pain medication, and returned to the preoperative ambulatory status. He was evaluated by Physical and Occupational Therapy and found to have 3x weekly needs at home, but unfortunately, all home health options were exhausted (either declined or out of network).  He will be discharged to ALF on POD 14 in stable condition.     After speaking with the oncology team about the concern for progression of his metastatic melanoma, the decision was made to restart his [name] and Braftovi. They will schedule close follow up.     Patient was discharged on dexamethasone 2 mg BID. Per endocrinology, he should administer NPH 4 units daily while on this steroid regimen. He should also have his fasting blood glucose checked twice daily to ensure he is not hypoglycemic. He does not require sliding scale insulin.    The patient's hospitalization has been complicated by the following clinically significant conditions requiring additional evaluation and treatment or having a significant effect of this patient's care: - Metastatic Cancer POA requiring further investigation, treatment, or monitoring  - Complex social situation/SDOH requiring consultation and support of Care Management    Discharge Day Services:   The patient was seen and evaluated by the Neurosurgery team on the day of discharge and deemed stable for discharge, including stable labs, vital signs, radiographic studies, and neurologic exam.  Discharge instructions were given and explained.  Questions were answered.    Physical Exam:  General: No acute distress  Cardiovascular: Hemodynamically stable  Pulmonary: Breathing is unlabored  Neurologic:   EOSp  AOX3  PERRL  EOMI  FS  TML  RUE/RLE 5  LUE/LLE 5  SILT  Very slight R drift  Incision c/d/I, staples removed    _____________________________________________________________________________    Temp:  [35.9 ??C (96.6 ??F)-36.9 ??C (98.4 ??F)] 36.9 ??C (98.4 ??F)  Heart Rate:  [68-114] 68  Resp:  [17-20] 19  BP: (123-140)/(75-93) 126/75  MAP (mmHg):  [88-106] 88  SpO2:  [96 %-100 %] 100 %  BMI (Calculated):  [37.46] 37.46      Condition at Discharge:   Stable  _____________________________________________________________________________  Discharge Medications:     Your Medication List        STOP taking these medications      calcium carbonate 1,500 mg (600  mg elem calcium) tablet            START taking these medications      ascorbic acid (vitamin C) 500 MG tablet  Commonly known as: VITAMIN C  Take 1 tablet (500 mg total) by mouth two (2) times a day.     calcium carbonate 200 mg calcium (500 mg) chewable tablet  Commonly known as: TUMS  Chew 2 tablets (400 mg elem calcium total) two (2) times a day for 30 days     insulin NPH 100 unit/mL injection  Commonly known as: NovoLIN N NPH U-100 Insulin  Inject 0.04 mL (4 Units total) under the skin daily.     lisinopril 10 MG tablet  Commonly known as: PRINIVIL,ZESTRIL  Take 1 tablet (10 mg total) by mouth daily.  Start taking on: April 19, 2023     multivitamins, therapeutic with minerals 9 mg iron-400 mcg tablet  Take 1 tablet by mouth daily for 30 days  Start taking on: April 19, 2023     polyethylene glycol 17 gram/dose powder  Commonly known as: GLYCOLAX  Mix 1 capful (17 g) in 4 to 8 ounves water, juice, soda, coffee, or tea and drink. Do this two (2) times a day for 30 days.     senna 8.6 mg tablet  Commonly known as: SENNA  Take 2 tablets by mouth two (2) times a day.            CHANGE how you take these medications      acetaminophen 325 MG tablet  Commonly known as: TYLENOL  Take 2 tablets (650 mg total) by mouth every six (6) hours as needed.  What changed:   medication strength  how much to take  when to take this  reasons to take this     dexAMETHasone 1 MG tablet  Commonly known as: DECADRON  Take 2 tablets (2 mg total) by mouth in the morning and 2 tablets (2 mg total) in the evening. Take with meals.  What changed:   medication strength  how much to take  when to take this            CONTINUE taking these medications      ALLEGRA ALLERGY 180 mg tablet  Generic drug: fexofenadine  Take 1 tablet (180 mg total) by mouth daily as needed (allergies).     blood sugar diagnostic Strp  Dispense 100 blood glucose test strips, ok to sub any brand preferred by insurance/patient, use 3x/day; dispense whatever brand matches with meter.     blood-glucose meter kit  Use as instructed; dispense 1 meter, whatever is preferred by insurance     BRAFTOVI 75 mg capsule  Generic drug: encorafenib  Take 6 capsules (450 mg total) by mouth daily.     calcitriol 0.25 MCG capsule  Commonly known as: ROCALTROL  Take 1 capsule (0.25 mcg total) by mouth daily.     DULoxetine 30 MG capsule  Commonly known as: CYMBALTA  Take 1 capsule (30 mg total) by mouth two (2) times a day.     ezetimibe 10 mg tablet  Commonly known as: ZETIA  Take 1 tablet (10 mg total) by mouth daily.     fluticasone propionate 50 mcg/actuation nasal spray  Commonly known as: FLONASE  1 spray into each nostril daily.     folic acid 1 MG tablet  Commonly known as: FOLVITE  Take 1 tablet (1,000 mcg total) by mouth daily.  lancets Misc  Dispense 100 lancets, ok to sub any brand preferred by insurance/patient, use 3x/day     [name] 15 mg tablet  Generic drug: binimetinib  Take 3 tablets (45 mg total) by mouth two (2) times a day.     ondansetron 8 MG tablet  Commonly known as: ZOFRAN  Take 1 tablet (8 mg total) by mouth every eight (8) hours as needed for nausea.     oxyCODONE 5 MG immediate release tablet  Commonly known as: ROXICODONE  Take 1 tablet (5 mg total) by mouth every four (4) hours as needed for up to 5 days.     sulfaSALAzine 500 mg tablet  Commonly known as: AZULFIDINE  Take 2 tablets (1,000 mg total) by mouth two (2) times a day.     SYNTHROID 125 mcg tablet  Generic drug: levothyroxine  Take 3 tablets (375 mcg total) by mouth daily.     zinc sulfate 220 mg (50 mg elemental zinc) capsule  Commonly known as: ZINCATE  Take 1 capsule (220 mg total) by mouth daily.            _____________________________________________________________________________  Pending Test Results (if blank, then none):  Pending Labs       Order Current Status    HLA Disease Associated Workup In process            Most Recent Labs:  Microbiology Results (last day)       ** No results found for the last 24 hours. **            Lab Results   Component Value Date    WBC 13.6 (H) 04/08/2023    HGB 12.7 (L) 04/08/2023    HCT 37.2 (L) 04/08/2023    PLT 248 04/08/2023       Lab Results   Component Value Date    NA 139 04/18/2023    K 3.8 04/18/2023    CL 99 04/18/2023    CO2 26.0 04/18/2023    BUN 25 (H) 04/18/2023    CREATININE 0.86 04/18/2023    CALCIUM 8.1 (L) 04/18/2023    MG 2.1 04/08/2023    PHOS 4.7 04/18/2023 _____________________________________________________________________________  Discharge Instructions:           Other Instructions       Discharge instructions      Pain:   You have been prescribed a narcotic pain medication. Do not drive or make critical/important decisions while taking this medication. Narcotic medications may cause constipation. Ensure you have adequate (>25 grams/day) of fiber in your diet and drink at least 64 oz. of water daily. You may also wish to take an over the counter stool softener once or twice daily as needed for constipation while taking narcotic pain medication.    Other:  -We will call in follow up in 2 weeks.  If patient does not receive an appointment within 1 week of discharge, please contact the neurosurgery clinic at the number listed above to confirm/schedule your appointment.    Things to look out for:  -Pain- please call the Neurosurgery resident on-call for any worsening pain unrelieved with prescribed medications.  -Fever- If you have a fever over 101.5 F, call the Neurosurgery resident on-call. A low grade fever is normal for some people after surgery.  -Redness, drainage, odor, bleeding- If your incision is red, draining, has a bad odor, or has increasing bleeding, contact the Neurosurgery office during business hours or the Neurosurgery resident on-call after hours. There  may be some normal swelling and pink-coloring around the wound. There may also be some fluid under the skin. It can take several weeks to months to be reabsorbed.  -Muscle strength- If an arm or leg becomes weak, call the Neurosurgery resident on     -Call immediately for the following:  -Bowel and Bladder- If you have new loss of bowel or bladder control, report straight to the emergency room.  -Mental status- If you or your family notice a change in your mental state from your normal, go straight to the emergency room. A change in mental status includes: increased confusion, trouble waking up, blurry/double vision, severe uncontrolled headaches, vomiting, loss of control of one side of your body, seizures, or other concerning issues.    Important Numbers:  -Neurosurgery resident on-call 24/7: Call [phone] for the [facility] hospital operator. Ask to speak with the Neurosurgery resident on-call.  -Neurosurgery Clinic: [phone] M-F 8am-5pm  -Spine Center: [phone] M-F 8am-5pm  -Neurosurgery Office: [phone] M-F 8am-5pm    OR Specific:  Wound care (Sutures/Staples):   Examine the surgical site at least twice daily. Please change the dressing when wet/dirty until the 3rd day after surgery, then keep the wound open to air. Do not use ointments, creams, gels, peroxide, or blow dryers on the wound. You may shower on the 4th day after surgery. Please shower daily starting on day 4 and use baby shampoo for head incisions to clean the wound. Do not scrub too vigorously. Do not submerge the wound under water (swimming or tub soaking) until 4-6 weeks after surgery. After showering, pat the wound dry and leave open to air. You have an appointment in approximately 2 weeks to have the stitches/staples removed. If you did not receive an appointment while in the hospital, the clinic will contact you will an appointment.    Wound care (Dermabond):   Examine the surgical site at least twice daily.  Do not use ointments, creams, gels, peroxide, or blow dryers on the wound. Please shower daily and use a mild soap and water for neck/back/groin incisions to clean the wound. Do not scrub too vigorously. Do not submerge the wound under water (swimming or tub soaking) until 2-4 weeks after surgery.  After showering, pat the wound dry and leave open to air.  If you have dermabond (surgical glue), do not pick at it as it will come off when ready.            Follow Up instructions and Outpatient Referrals     Ambulatory Referral to Home Health      Reason for referral: brain tumor    Physician to follow patient's care: PCP    Disciplines requested:  Physical Therapy  Occupational Therapy       Physical Therapy requested:  Ambulation training  Transfer training  Strengthening exercises  Evaluate and treat       Occupational Therapy Requested:  Transfer training  ADL or IADL training  Cognitive training  Evaluate and treat  Strengthening exercises       Requested SOC Date:  Comment - within 48 hours    Discharge instructions          Appointments which have been scheduled for you      Apr 24, 2023 9:00 AM  (Arrive by 8:45 AM)  RETURN  ENDOCRINE with Tripuraneni Jackquline Denmark, MD  [facility] DIABETES AND ENDOCRINOLOGY [place]) [address]  [address] 1 through  4  [place] [address]  [phone]        Apr 25, 2023 3:00 PM  (Arrive by 2:30 PM)  RETURN ACTIVE [place] with Ansel Bong, MD  [facility] SURGERY ONCOLOGY [place] [facility] REGION) [address]  [place] [address]  [phone]        Apr 30, 2023 10:30 AM  (Arrive by 10:00 AM)  LAB ONLY [place] with ADULT ONC LAB  [facility] ADULT ONCOLOGY LAB DRAW STATION [place] [facility] REGION) [address]  [place] [address]  [phone]        Apr 30, 2023 11:30 AM  (Arrive by 11:00 AM)  RETURN LUNG ACTIVE [place] with [name], MD  [place] [facility] MULTIDISCIPLINARY 2ND FLR [facility] REGION) [address]  [place] [address]  [phone]        Apr 30, 2023 12:20 PM  (Arrive by 12:05 PM)  CT CHEST W CONTRAST with IC CT RM 1  RAD [facility] [facility]) [address]  1st Floor  [place] [address]  [phone]   On appt date:  Drink lots of water 24 hrs  Bring recent lab work  Take meds as usual  [employer] fast streamer of current meds  Bring snack if diabetic    Let us know if pt:  Allergic to contrast dyes  Diabetic  Pregnant or nursing  Claustrophobic    (Title:CTWCNTRST)         Apr 30, 2023 12:45 PM  (Arrive by 12:30 PM)  MRI Brain With and Without Contrast with IC MRI MBL 1  IMG MRI IMAGING CENTER [facility]) [address]  1st Floor  [place] [address]  [phone]   On appt date:  Bring recent lab work  Bring documentation of any metal object implants  Take meds as usual  Check w/physician if diabetic  You will be asked to change into a gown for your safety    On appt date do not:  No restrictions on food/drink  Wear metallic items including jewelry (we are not responsible for lost items)    Let us know if pt:  Claustrophobic  Metal object implant  Pregnant  Prescribed a sedative  On dialysis  Allergic to MRI dye/contrast  Kidney Failure    (Title:MRIWCNTRST)         Jun 03, 2023 10:20 AM  (Arrive by 10:05 AM)  Physical with Benison Pap Mangel, DO  [facility] PRIMARY CARE [address] AT [facility] REGION) [address] [place]  [place] [address]  [phone]   Arrive 15 minutes prior to appointment.         Jul 02, 2023 2:00 PM  (Arrive by 1:30 PM)  MRI Brain With and Without Contrast with [facility] MRI RM 5  IMG MRI [facility]) [address]  [place] [address]  [phone]   On appt date:  Bring recent lab work  Bring documentation of any metal object implants  Take meds as usual  Check w/physician if diabetic  You will be asked to change into a gown for your safety    On appt date do not:  No restrictions on food/drink  Wear metallic items including jewelry (we are not responsible for lost items)    Let us know if pt:  Claustrophobic  Metal object implant  Pregnant  Prescribed  a sedative  On dialysis  Allergic to MRI dye/contrast  Kidney Failure    (Title:MRIWCNTRST)         Jul 02, 2023 4:00 PM  (Arrive by 3:30 PM)  FOLLOW UP 30 with Colette Buford Dresser, MD  [facility] RADIATION ONCOLOGY [place] [facility] REGION) [address]  [place] [address]  [phone]        Jul 24, 2023 1:30 PM  (Arrive by 1:15 PM)  RETURN ENT with Adam [place] Kimple, MD  [place] [facility]OLOG[facility] [place] [address] [place] [facility] REGION) [address]  [place] [address]  [phone]               Resources and Referrals       Shower chair      Type: With Back    Length of Need: 99 months    Height: 188 cm (6' 2)     Weight: (!) 132.4 kg (291 lb 14.2 oz)        Height: 188 cm (6' 2)     Weight: (!) 132.4 kg (291 lb 14.2 oz)    Walker      Type: Rolling    Wheels: 4 Wheels & Seat    Length of Need: 99 months    Height: 188 cm (6' 2)     Weight: (!) 132.4 kg (291 lb 14.2 oz)        Height: 188 cm (6' 2)     Weight: (!) 132.4 kg (291 lb 14.2 oz)              Time-based billing disclaimer:          I spent 65 minutes in activities related to discharge for this patient

## 2023-04-18 NOTE — Unmapped (Signed)
 Follow up Medical Oncology Consult Note    Requesting Attending Physician:  Loni Rm, MD  Service Requesting Consult: Neurosurgery [facility])  Primary Oncologist: Dr. Candance Certain, transitioning to Dr. Clayton Curd Moschos     Reason for Consult: Re-consulted on Metastatic Melanoma status post Right Craniotomy for resection with headache and Vomiting post operative day 13     Assessment: Kenneth Fitzgerald is a 48 y.o. male with a medical history significant for metastatic (M1d) melanoma who was admitted for planned neurosurgical intervention now POD 13 s/p Right craniotomy for resection and Cs- 131 Gamma tile placement.    Oncology was consulted to provide further recommendations given new symptoms of Headache and vomiting     Metastatic Melanoma status post Right Craniotomy for resection and Cs- 131 Gamma tile placement ( 04/04/2023)    Medical oncology was previously consulted on 04/07/23 for evaluation of restarting targeted therapy after surgery. See consult note of 04/07/2023     From prior consult note    Kenneth Fitzgerald has a complicated oncologic history including two primaries, most recently metastatic melanoma. In the metastatic setting, he has received nivolumab x 2c which was complicated by G3 colitis. Additionally, he has been on encorafinib/binimetinib since May 2020, which has been held due to neurosurgical intervention and concern for bleeding (given already existing perilesional hemorrhage). He has also received multiple prior course of radiation to the brain, including whole brain to 30Gy in 10 fx completed 01/20/20 and 2 left frontal lesions and right cingulate gyrus lesion to 20 Gy in 1 fraction completed 05/07/22.     At this point, it would be appropriate to hold his targeted therapy for several reasons. First, he has recently had surgical resection with intraoperative cesium. Second, there may be perioperative risk for bleeding. Third, while pathology is pending, it would be worth waiting for results from the recent pathology from recent neurosurgical intervention. If this is deemed to be due to CNS progression on targeted therapy (encorafinib/binimetinib), it would be worth discussion with his outpatient melanoma specialist whether continuing with targeted therapy or whether switching to other systemic therapy (i.e. immunotherapy in a patient with history of ankylosing spondylitis and prior G3 ICI colitis) would be more appropriate    - Pathology result of brain tumor showed metastatic melanoma  - Recent episode of headache and Vomiting was evaluated with imaging with CT head 04/16/23 which showed interval increased vasogenic edema within the left frontoparietal and bilateral temporal regions  - We will discuss with our melanoma specialist on restarting the encorafenib/binimetinib which has been held prior to surgical intervention     - Also given with the use encorafenib/binimetinib there is a 10-20% risk of bleeding and 1-5 % risk of severe bleeding and fatal intracranial hemorrhage of 1% based on data available, we reached out to surgical team to weigh in risk of bleeding prior to restarting BRAF/ MEK inhibitor      Interval History  ------------------------------------    Oncology team met with patient, we explained that we recommend restarting the encorafenib/binimetinib which has been held prior to surgical intervention, patient informed of his upcoming oncology outpatient appointment      Recommendations:     - We recommend restarting the encorafenib/binimetinib which has been held prior to surgical intervention     - Patient has a follow up outpatient appointment scheduled which has been moved up      This patient has been seen and discussed with Dr. Vallie Gay These recommendations were discussed with  the primary team.     Please contact the oncology team at [phone] with any further questions.      Chele Cornell MD MPH  [facility]  PGY-4 Fellow          -------------------------------------------------------------    HPI: Kenneth Fitzgerald is a 48 y.o. male and who is being seen at the request of Loni Rm, MD for evaluation of metastatic melanoma.      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 He was also found to have melanoma of the scalp (2012), initially stage IIIb, for which he received adjuvant HD-IFN (2014) and cervical neck dissection and XRT (2015) upon recurrence. This eventually progressed with abdominal/pelvic nodal metastases, for which he received nivolumab x 2C, c/b G3 colitis. Since May 2020, he has been on encorafinib/binimetinib. Additionally, after CNS progression (2020), he has received WBRT (completed 01/20/20) and SRS to 3 lesions (05/07/22).     Based on review of MRI 01/30/23, there was concern for hemorrhage of disease in R frontal lobe, possibly orriginating from a previously treated lesion. He was admitted for planned neurosurgical resection with intraoperative brachytherapy with Cs-131 GammaTiles, which was performed on 04/04/23.    He is now now POD 13 s/p Right craniotomy for resection and Cs- 131 Gamma tile placement.      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  Thyroidectomy       Past Surgical History:   Procedure Laterality Date    COLON SURGERY      Partial Colectomy    COLONOSCOPY      COSMETIC SURGERY      Related to excision of Cancer.    PR COLONOSCOPY FLX DX W/COLLJ SPEC WHEN PFRMD N/A 08/29/2021    Procedure: COLONOSCOPY, FLEXIBLE, PROXIMAL TO SPLENIC FLEXURE; DIAGNOSTIC, W/WO COLLECTION SPECIMEN BY BRUSH OR WASH;  Surgeon: Dionicia Frater, MD;  Location[facility];  Service: Gastroenterology    PR COLONOSCOPY W/BIOPSY SINGLE/MULTIPLE N/A 01/09/2018    Procedure: COLONOSCOPY, FLEXIBLE, PROXIMAL TO SPLENIC FLEXURE; WITH BIOPSY, SINGLE OR MULTIPLE;  Surgeon: Celestia Colander, MD;  [facility];  Service: Gastroenterology    PR COLONOSCOPY W/BIOPSY SINGLE/MULTIPLE N/A 08/24/2019    Procedure: COLONOSCOPY, FLEXIBLE, PROXIMAL TO SPLENIC FLEXURE; WITH BIOPSY, SINGLE OR MULTIPLE;  Surgeon: Dorthy Gavia, MD;  Location: GI PROCEDURES [place] [facility];  Service: Gastroenterology  PR COLONOSCOPY W/BIOPSY SINGLE/MULTIPLE N/A 08/29/2021    Procedure: COLONOSCOPY, FLEXIBLE, PROXIMAL TO SPLENIC FLEXURE; WITH BIOPSY, SINGLE OR MULTIPLE;  Surgeon: Dionicia Frater, MD;  Location: [facility];  Service: Gastroenterology    PR COLSC FLX WITH DIRECTED SUBMUCOSAL NJX ANY SBST N/A 08/24/2019    Procedure: COLONOSCOPY, FLEXIBLE, PROXIMAL TO SPLENIC FLEXURE; WITH DIRECTED SUBMUCOSAL INJECTION(S), ANY SUBSTANCE;  Surgeon: Dorthy Gavia, MD;  Location: [facility] PROCEDURES [place] [facility];  Service: Gastroenterology    PR EXCIS SUPRATENT BRAIN TUMOR Right 04/04/2023    Procedure: CRANIECTOMY; EXC BRAIN TUMOR-SUPRATENTORIAL;  Surgeon: Loni Rm, MD;  Location: OR [facility];  Service: Neurosurgery    PR EXPLORATORY OF ABDOMEN N/A 01/24/2020    Procedure: EXPLORATORY LAPAROTOMY, EXPLORATORY CELIOTOMY WITH OR WITHOUT BIOPSY(S);  Surgeon: Caro Christmas, MD;  Location: MAIN OR [place];  Service: Trauma    PR FREEING BOWEL ADHESION,ENTEROLYSIS N/A 01/24/2020    Procedure: ENTEROLYSIS (SEPART PROC);  Surgeon: Arielle Jaclyn Perez, MD;  Location: MAIN OR [facility];  Service: Trauma    PR IMPLANT MESH HERNIA REPAIR/DEBRIDEMENT CLOSURE N/A 01/03/2015    Procedure: IMPLANTATION OF MESH/OTHER PROSTHES INCISION/VENTRAL HERNIA REPAIR/MESH CLOSE DEBRID NECROT SOFT TIS INFECT;  Surgeon: Elfrieda Grise, MD;  Location: MAIN OR [place];  Service: Gastrointestinal    PR IONM 1 ON 1 IN OR W/ATTENDANCE EACH 15 MINUTES N/A 04/04/2023    Procedure: CONTINUOUS INTRAOPERATIVE NEUROPHYSIOLOGY MONITORING IN OR;  Surgeon: Loni Rm, MD;  Location: OR [facility];  Service: Neurosurgery    PR LAP, VENTRAL HERNIA REPAIR,REDUCIBLE N/A 04/07/2014    Procedure: LAPAROSCOPY, SURGICAL, REPAIR, VENTRAL, UMBILICAL, SPIGELIAN OR EPIGASTRIC HERNIA, REDUCIBLE;  Surgeon: Elfrieda Grise, MD;  Location: MAIN OR [place];  Service: Gastrointestinal    PR MICROSURG TECHNIQUES,REQ OPER MICROSCOPE N/A 04/04/2023    Procedure: MICROSURGICAL TECHNIQUES, REQUIRING USE OF OPERATING MICROSCOPE (LIST SEPARATELY IN ADDITION TO CODE FOR PRIMARY PROCEDURE);  Surgeon: Loni Rm, MD;  Location: OR [facility];  Service: Neurosurgery    PR NEGATIVE PRESSURE WOUND THERAPY DME >50 SQ CM N/A 01/24/2020    Procedure: NEG PRESS WOUND TX (VAC ASSIST) INCL TOPICALS, PER SESSION, TSA GREATER THAN/= 50 CM SQUARED;  Surgeon: Caro Christmas, MD;  Location: MAIN OR [place];  Service: Trauma    PR REMOVAL NODES, NECK,CERV CMPLT Left 12/07/2012    Procedure: CERVICAL LYMPHADENECTOMY (COMPLETE);  Surgeon: Caroleen Churn, MD;  Location: MAIN OR [facility];  Service: Surgical Oncology    PR REMOVAL NODES, NECK,CERV MOD RAD Right 03/29/2013    Procedure: CERVICAL LYMPHADENECTOMY (MODIFIED RADICAL NECK DISSECTION);  Surgeon: Caroleen Churn, MD;  Location: MAIN OR [facility];  Service: Surgical Oncology PR REPAIR RECURR INCIS [facility] N/A 01/03/2015    Procedure: REPAIR RECURRENT INCISIONAL OR VENTRAL HERNIA; REDUCIBLE;  Surgeon: Elfrieda Grise, MD;  Location: MAIN OR [facility];  Service: Gastrointestinal    PR REPAIR RECURR INCIS HERNIA,STRANG N/A 06/21/2016    Procedure: REPAIR RECURRENT INCISIONAL OR VENTRAL HERNIA; INCARCERATED OR STRANGULATED;  Surgeon: Rory Collard, MD;  Location: MAIN OR [facility];  Service: Gastrointestinal    PR REPAIR RECURR INCIS HERNIA,STRANG N/A 01/24/2020    Procedure: REPAIR RECURRENT INCISIONAL OR VENTRAL HERNIA; INCARCERATED OR STRANGULATED;  Surgeon: Caro Christmas, MD;  Location: MAIN OR [place];  Service: Trauma    PR SIGMOIDOSCOPY,FINE NEEDL BX,US  GUIDED N/A 01/09/2018    Procedure: SIGMOIDOSCOPY, FLEXIBLE, W/TRANSENDOSCOPIC ULTRASOUND GUIDED NEEDLE ASPIRATION;  Surgeon: Celestia Colander, MD;  [facility]: [facility];  Service: Gastroenterology    THYROID SURGERY  Family History   Problem Relation Age of Onset    Hyperthyroidism Mother     Osteoporosis Mother     Arrhythmia Mother     Squamous cell carcinoma Mother         basal cell vs squamous cell skin cancer    Arthritis Mother     Cancer Mother     Coronary artery disease Father         s/p CABG    Diabetes Father     Hypertension Father     Prostate cancer Father     Angina Father     Cancer Father     Heart disease Father     Colon cancer Paternal Grandmother     Diabetes Paternal Grandmother     Diabetes Maternal Aunt     Diabetes Paternal Uncle     Diabetes Paternal Aunt     Thyroid disease Neg Hx        Social History     Socioeconomic History    Marital status: Single   Tobacco Use    Smoking status: Never     Passive exposure: Never    Smokeless tobacco: Never   Vaping Use    Vaping status: Never Used   Substance and Sexual Activity    Alcohol use: Yes     Comment: social drinker 3 times yearly    Drug use: No     Social Drivers of Psychologist, prison and probation services Strain: Low Risk (03/25/2023)    Overall Financial Resource Strain (CARDIA)     Difficulty of Paying Living Expenses: Not hard at all   Food Insecurity: No Food Insecurity (03/25/2023)    Hunger Vital Sign     Worried About Running Out of Food in the Last Year: Never true     Ran Out of Food in the Last Year: Never true   Transportation Needs: No Transportation Needs (03/25/2023)    PRAPARE - Therapist, art (Medical): No     Lack of Transportation (Non-Medical): No   Physical Activity: Inactive (11/05/2021)    Exercise Vital Sign     Days of Exercise per Week: 0 days     Minutes of Exercise per Session: 0 min   Stress: Stress Concern Present (11/05/2021)    [employer] of Occupational Health - Occupational Stress Questionnaire     Feeling of Stress : To some extent   Social Connections: Moderately Isolated (11/05/2021)    Social Connection and Isolation Panel [NHANES]     Frequency of Communication with Friends and Family: More than three times a week     Frequency of Social Gatherings with Friends and Family: Once a week     Attends Religious Services: More than 4 times per year     Active Member of [employer] or Organizations: No     Attends Banker Meetings: Never     Marital Status: Never married   Housing: Low Risk  (03/25/2023)    Housing     Within the past 12 months, have you ever stayed: outside, in a car, in a tent, in an overnight shelter, or temporarily in someone else's home (i.e. couch-surfing)?: No     Are you worried about losing your housing?: No       Allergies: is allergic to compazine [prochlorperazine] and coconut.    Medications:   Meds:   ascorbic acid (vitamin C)  500 mg Oral BID  calcitriol  0.25 mcg Oral Daily    calcium carbonate  400 mg elem calcium Oral BID    dexAMETHasone  1 mg Oral Daily    DULoxetine  30 mg Oral BID    enoxaparin (LOVENOX) injection  40 mg Subcutaneous QPM    ezetimibe  10 mg Oral Daily    flu vacc [id number] 6mos up(PF)  0.5 mL Intramuscular During hospitalization    folic acid  1 mg Oral Daily    insulin lispro  0-20 Units Subcutaneous ACHS    levothyroxine  375 mcg Oral Daily    lisinopril  10 mg Oral Daily    loratadine  10 mg Oral Daily    multivitamins (ADULT)  1 tablet Oral Daily    polyethylene glycol  17 g Oral BID    senna  2 tablet Oral BID    sulfaSALAzine  1,000 mg Oral BID    zinc sulfate  220 mg Oral Daily     Continuous Infusions:  PRN Meds:.acetaminophen, bisacodyl, hydrALAZINE, labetalol, morphine, ondansetron **OR** ondansetron, oxyCODONE, oxyCODONE, sodium chloride    Objective:   Vitals: Temp:  [35.9 ??C (96.6 ??F)-36.9 ??C (98.4 ??F)] 36.9 ??C (98.4 ??F)  Heart Rate:  [68-114] 68  Resp:  [17-20] 19  BP: (123-140)/(75-93) 126/75  MAP (mmHg):  [88-106] 88  SpO2:  [96 %-100 %] 100 %    Physical Exam:  BP 126/75  - Pulse 68  - Temp 36.9 ??C (98.4 ??F) (Oral)  - Resp 19  - Ht 188 cm (6' 2)  - Wt (!) 132.4 kg (291 lb 14.2 oz)  - SpO2 100%  - BMI 37.48 kg/m??    General appearance - alert, well appearing, and in no distress   Mental status - alert, oriented to person, place, and time   Eyes - pupils equal and reactive, extraocular eye movements intact   Nose - normal and patent, no erythema, discharge or polyps   Mouth - mucous membranes moist, pharynx normal without lesions   Neck - supple   Lymphatics - no palpable lymphadenopathy   Pulmonary - Not on acute respiratory distress  Cardiovascular- Sinus tachycardia  Gastrointestinal - Non distended, non tender   Neurological - alert, oriented, normal speech, no focal findings or movement disorder noted   Musculoskeletal - no joint tenderness, deformity or swelling   Extremities - peripheral pulses normal, no pedal edema, no clubbing or cyanosis   Skin - normal coloration and turgor, no rashes, no suspicious skin lesions noted       ECOG Performance Status: 2 - Ambulatory and capable of all selfcare but unable to carry out any work activities. Up and about more than 50% of waking hours      Test Results  Lab Results   Component Value Date    WBC 13.6 (H) 04/08/2023    HGB 12.7 (L) 04/08/2023    HCT 37.2 (L) 04/08/2023    PLT 248 04/08/2023       Lab Results   Component Value Date    NA 139 04/18/2023    K 3.8 04/18/2023    CL 99 04/18/2023    CO2 26.0 04/18/2023    BUN 25 (H) 04/18/2023    CREATININE 0.86 04/18/2023    GLU 124 04/18/2023    CALCIUM 8.1 (L) 04/18/2023    MG 2.1 04/08/2023    PHOS 4.7 04/18/2023       Lab Results   Component Value Date    BILITOT 0.3 04/04/2023  BILIDIR 0.20 01/27/2020    PROT 7.1 04/04/2023    ALBUMIN 3.7 04/18/2023    ALT 99 (H) 04/04/2023    AST 67 (H) 04/04/2023    ALKPHOS 138 (H) 04/04/2023    GGT 42 08/22/2011       Lab Results   Component Value Date    INR 1.18 03/27/2023    APTT 26.0 03/27/2023         Imaging: Radiology studies were personally reviewed

## 2023-04-18 NOTE — Unmapped (Addendum)
 Please schedule this patient for an appointment as noted below.  Please contact the patient as needed/appropriate to alert them of this appointment.    Provider: Salli Quarry, MD and Dr Flonnie Hailstone and Saunders Glance    Date or Time Frame: 2 weeks    Reason for Visit: post op melanoma    Imaging Needed: none    Please call sister Tammy to schedule appt. [phone]

## 2023-04-18 NOTE — Unmapped (Signed)
 [facility] Delivery Pharm[facility] Refill Coordination Note    Specialty Medication(s) to be Shipped:   Hematology/Oncology: [name]    Other medication(s) to be shipped: No additional medications requested for fill at this time     Kenneth Fitzgerald, DOB: 03-Jun-1975  Phone: [phone] (home)       All above HIPAA information was verified with patient's family member, Sister.     Was a Nurse, learning disability used for this call? No    Completed refill call assessment today to schedule patient's medication shipment from the [facility] and Home Delivery Pharmacy  [phone]).  All relevant notes have been reviewed.     Specialty medication(s) and dose(s) confirmed: Regimen is correct and unchanged.   Changes to medications: [place] reports no changes at this time.  Changes to insurance: No  New side effects reported not previously addressed with a pharmacist or physician: None reported  Questions for the pharmacist: No    Confirmed patient received a Conservation officer, historic buildings and a Surveyor, mining with first shipment. The patient will receive a drug information handout for each medication shipped and additional FDA Medication Guides as required.       DISEASE/MEDICATION-SPECIFIC INFORMATION        N/A    SPECIALTY MEDICATION ADHERENCE     Medication Adherence    Specialty Medication: [name] 15 mg tablet  Patient is on additional specialty medications: Yes  Additional Specialty Medications: Braftovi 75 mg  Patient Reported Additional Medication X Missed Doses in the Last Month: 1-2  Patient is on more than two specialty medications: No              Were doses missed due to medication being on hold? No    [name] 15 mg: 3 days of medicine on hand        REFERRAL TO PHARMACIST     Referral to the pharmacist: Not needed      [facility]     Shipping address confirmed in Epic.     Cost and Payment: Patient has a $0 copay, payment information is not required.    Delivery Scheduled: Yes, Expected medication delivery date: 04/22/23.     Medication will be delivered via UPS to the temporary address in Epic WAM.    Willette Pa   [facility] Delivery [facility]  Specialty Technician

## 2023-04-19 MED ORDER — MULTIVITAMIN-IRON 9 MG-FOLIC ACID 400 MCG-CALCIUM AND MINERALS TABLET
ORAL_TABLET | Freq: Every day | ORAL | 0 refills | 30 days | Status: CP
Start: 2023-04-19 — End: 2023-04-18

## 2023-04-19 MED ORDER — LISINOPRIL 10 MG TABLET
ORAL_TABLET | Freq: Every day | ORAL | 0 refills | 30 days | Status: CP
Start: 2023-04-19 — End: 2023-04-18

## 2023-04-21 MED FILL — MEKTOVI 15 MG TABLET: ORAL | 30 days supply | Qty: 180 | Fill #4

## 2023-04-22 ENCOUNTER — Ambulatory Visit: Admit: 2023-04-22 | Discharge: 2023-04-23 | Attending: Hematology & Oncology | Primary: Hematology & Oncology

## 2023-04-22 DIAGNOSIS — C434 Malignant melanoma of scalp and neck: Principal | ICD-10-CM

## 2023-04-22 DIAGNOSIS — C439 Malignant melanoma of skin, unspecified: Principal | ICD-10-CM

## 2023-04-22 DIAGNOSIS — C7931 Secondary malignant neoplasm of brain: Principal | ICD-10-CM

## 2023-04-23 NOTE — Unmapped (Signed)
Scheduled and confirmed post op

## 2023-04-23 NOTE — Unmapped (Unsigned)
 Assessment & Plan  T1N1b papillary thyroid cancer (classic/follicular variant), multifocal  - Tg on suppression has been stable/low, with fluctuations commensurate with fluctuations in TSH. Tg Ab has been negative. Last check of both 09/2022. Repeat yearly.  -Last US  04/03/22 stable, repeat US  ~ 03/2024 if Tg/Ab stable and clinically appropriate    Hypothyroidism  Well-managed on levothyroxine 375 mcg daily by last labs. Monitor both TSH and T4 due to h/o brain XRT and potential pituitary involvement with brain metastasis from melanoma.  - Continue levothyroxine 375 mcg daily.  - Monitor TSH and T4, placed today, will do in next 1-2 months    Hypocalcemia, severe in the past, due to post surgical hypoparathyroidism  Managed with calcitriol 0.25 mcg daily and dietary/supplemental calcium as needed.   - Continue calcitriol 0.25 mcg daily.  - Ensure adequate calcium intake through diet and supplements. Details reviewed, aim for 1,200-1,500 mg/day total.  - Recent labs stable  - Reviewed goals of treatment and symptoms or signs with which to take extra calcium and/or notify us .     Stage IV melanoma with brain metastases  Recent hospitalization for management. Started on dexamethasone 6 mg BID. Surgery pending 3/21.  - Monitor blood glucose levels regularly as per prescribing team, reviewed BG values and s/s with which to notify PCP or prescribing team     No follow-ups on file.     No orders of the defined types were placed in this encounter.            Reason For Visit:   Chief Complaint   Patient presents with    Follow-up thyroid cancer and hypoparathyroidism      Subjective:     History of Present Illness:    03/31/23 - video visit, taking levothyroxine 375 mcg daily, Calcitrol 0.25 along with dietary calcium, taking dexamethasone 6 mg twice daily pending surgery for brain metastases,.  RTC 6 mo  3/21 to 4/4-admit for right craniotomy for resection of metastases, discharged to assisted living facility due to assistance needs that could not be met through home health, discharged on dexamethasone 2 mg twice daily, inpatient endocrinology team followed throughout hospital stay and advised administering NPH 4 units twice daily and check BG twice daily, no sliding scale insulin given, has visit with me 04/24/2023    ***    History of Present Illness  Kenneth Fitzgerald is a 48 year old male with thyroid cancer, hypothyroidism, hypoparathyroidism, and brain metastases from melanoma who presents for follow-up of thyroid and calcium. *Changed from in person to video visit today re: recent hospital stay and worsening of brain mets from melanoma.    He was hospitalized from March 12 to March 28, 2023, due to complications related to melanoma with metastases to the brain. During this hospitalization, he was treated with dexamethasone 6 mg for symptoms affecting brain function, including memory issues. He has noticed improvement in these symptoms since discharge. Surgery/craniotomy is scheduled for 3/21.    He has a history of thyroid cancer and hypothyroidism, for which he is on levothyroxine 375 mcg daily. He continued this medication during his hospital stay. His thyroid function tests were not checked during the recent hospitalization, but he is due for follow-up labs.    He also has a history of parathyroid issues and is taking calcitriol 0.25 mcg daily. His calcium levels were stable during his hospital stay, and he reports dietary calcium intake primarily from milk.    He has a history of  prediabetes, particularly when on steroids, and was advised to monitor his blood sugar levels. He has the necessary supplies to check his blood sugar at home.    He experienced nausea and vomiting in the past week, but these symptoms have resolved at the time of the visit. No current nausea or vomiting.    Weight trend:  Wt Readings from Last 6 Encounters:   04/22/23 (!) 134.6 kg (296 lb 12.8 oz)   04/18/23 (!) 132.4 kg (291 lb 14.2 oz)   03/27/23 (!) 140.3 kg (309 lb 4.9 oz)   03/24/23 (!) 140.3 kg (309 lb 4.9 oz)   02/18/23 (!) 144 kg (317 lb 6.4 oz)   02/12/23 (!) 145.2 kg (320 lb 1.7 oz)       PMH:  1. Stage T1N1b papillary thyroid cancer. Initially noted on PET when following melanoma. Classic/follicular variant), multifocal, s/p 3 surgeries and 2 doses of I-131. Last treatment was in 2015. Indeterminate response to treatment based on persistent detectable Tg (< 1 on suppression, rose to 2.4 with stimulation in Jan 2018).      1a. Thyroidectomy 06/25/2011 with 1 foci each lobe and 2/2 central LN (left Level 6)     1b. Thyrogen-based RAI 125 mCi 03/2012 with pre/post scans showing fairly large focus of bed activity and 4.2% uptake; pretx stimulated Tg of 73     1c. Recurrence on US  09/2012 in left thyroid bed and confirmed by FNA 10/2012; left LND in 11/2012 levels 3-6 with 12/16 positive (largest 1.8 cm, and some with extranodal extension)     1d. Right CND 03/2013 levels 2-4 with 3/20 positive (largest 3.5 mm), found incidentally as the right ND was performed for melanoma recurrence which was 1 cm node in 1 of 20 nodes     1e. Thyrogen-based treatment with 150 mCi of I-131 in 05/2013 [LID as well before the treatment]      94f. Thyrogen-stimulated Tg was 2.4 in 01/2016. WBS in 01/2016 was negative.  2. Postsurgical hypothyroidism  3. Postsurgical hypoparathyroidism  Hypocalcemia was severe at presentation, initially treated with supplemental high-dose calcium alone  Started calcitriol 2017 (low calcium in 2019 and in 2020 when off calcitriol)  Calcium at or close to goal when taking calcitriol regularly and getting enough dietary or supplemental calcium. Can drop very low otherwise.  3. Metastatic melanoma - scalp lesion, surgery including RND, and systemic therapy ending 01/2012; recurrence in a 1 cm right LN removed 03/2013 on repeat RND; XRT completed 06/2013; whole brain XRT for brain metastases 12/2019-01/2020; XRT to brain again 04/2022  4. Diverticulitis  5. H/o anklyosing spondylitis 2009  6.  Small bowel obstruction, hospitalization 11/2018, due to adhesions from past abdominal surgeries including hernia repairs  7. Hyperglycemia, new in 03/2018, in the setting of 4 weeks of prednisone to treat a reaction to immunotherapy.  A1c 6.0% in 06/2018. Took MTF short time. A1c improved in 08/2019. Has been off MTF since 12/2019.  -A1c normal 04/2020. Glucose values on recent panels have been normal.   8. Crohns, diagnosed 09/2019 by Dr. Sheria Dills in GI, but likely present since 2019 per note, defer immunosuppressive therapy re: active melanoma, start w sulfasalazine     PSH reviewed in Epic      Current Outpatient Medications:     acetaminophen (TYLENOL) 325 MG tablet, Take 2 tablets (650 mg total) by mouth every six (6) hours as needed., Disp: 60 tablet, Rfl: 0    ascorbic acid, vitamin C, (VITAMIN C) 500  MG tablet, Take 1 tablet (500 mg total) by mouth two (2) times a day., Disp: 60 tablet, Rfl: 0    binimetinib ([name]) 15 mg tablet, Take 3 tablets (45 mg total) by mouth two (2) times a day., Disp: 180 tablet, Rfl: 11    blood sugar diagnostic Strp, Dispense 100 blood glucose test strips, ok to sub any brand preferred by insurance/patient, use 3x/day; dispense whatever brand matches with meter., Disp: 100 strip, Rfl: 12    blood-glucose meter kit, Use as instructed; dispense 1 meter, whatever is preferred by insurance, Disp: 1 each, Rfl: 1    calcitriol (ROCALTROL) 0.25 MCG capsule, Take 1 capsule (0.25 mcg total) by mouth daily., Disp: 90 capsule, Rfl: 3    calcium carbonate (TUMS) 200 mg calcium (500 mg) chewable tablet, Chew 2 tablets (400 mg elem calcium total) two (2) times a day for 30 days, Disp: 120 tablet, Rfl: 0    dexAMETHasone (DECADRON) 1 MG tablet, Take 2 tablets (2 mg total) by mouth in the morning and 2 tablets (2 mg total) in the evening. Take with meals., Disp: 120 tablet, Rfl: 0    DULoxetine (CYMBALTA) 30 MG capsule, Take 1 capsule (30 mg total) by mouth two (2) times a day., Disp: , Rfl:     encorafenib (BRAFTOVI) 75 mg capsule, Take 6 capsules (450 mg total) by mouth daily., Disp: 180 capsule, Rfl: 11    ezetimibe (ZETIA) 10 mg tablet, Take 1 tablet (10 mg total) by mouth daily., Disp: 90 tablet, Rfl: 3    fexofenadine (ALLEGRA ALLERGY) 180 MG tablet, Take 1 tablet (180 mg total) by mouth daily as needed (allergies)., Disp: , Rfl:     fluticasone propionate (FLONASE) 50 mcg/actuation nasal spray, 1 spray into each nostril daily., Disp: , Rfl:     folic acid (FOLVITE) 1 MG tablet, Take 1 tablet (1,000 mcg total) by mouth daily., Disp: 90 tablet, Rfl: 3    insulin NPH (NOVOLIN N NPH U-100 INSULIN) 100 unit/mL injection, Inject 0.04 mL (4 Units total) under the skin daily., Disp: 1.2 mL, Rfl: 0    lancets Misc, Dispense 100 lancets, ok to sub any brand preferred by insurance/patient, use 3x/day, Disp: 100 each, Rfl: 12    levothyroxine (SYNTHROID) 125 MCG tablet, Take 3 tablets (375 mcg total) by mouth daily., Disp: 270 tablet, Rfl: 3    lisinopril (PRINIVIL,ZESTRIL) 10 MG tablet, Take 1 tablet (10 mg total) by mouth daily., Disp: 30 tablet, Rfl: 0    multivitamins, therapeutic with minerals 9 mg iron-400 mcg tablet, Take 1 tablet by mouth daily., Disp: 30 tablet, Rfl: 0    ondansetron (ZOFRAN) 8 MG tablet, Take 1 tablet (8 mg total) by mouth every eight (8) hours as needed for nausea., Disp: 30 tablet, Rfl: 1    oxyCODONE (ROXICODONE) 5 MG immediate release tablet, Take 1 tablet (5 mg total) by mouth every four (4) hours as needed for pain for up to 5 days., Disp: 30 tablet, Rfl: 0    polyethylene glycol (GLYCOLAX) 17 gram/dose powder, Mix 1 capful (17 g) in 4 to 8 ounves water, juice, soda, coffee, or tea and drink. Do this two (2) times a day for 30 days., Disp: 1020 g, Rfl: 0    SENNA 8.6 mg tablet, Take 2 tablets by mouth two (2) times a day., Disp: 120 tablet, Rfl: 0    sulfaSALAzine (AZULFIDINE) 500 mg tablet, Take 2 tablets (1,000 mg total) by mouth two (2) times a day., Disp:  360 tablet, Rfl: 3    zinc sulfate (ZINCATE) 220 mg (50 mg elemental zinc) capsule, Take 1 capsule (220 mg total) by mouth daily., Disp: , Rfl:       Allergies   Allergen Reactions    Compazine [Prochlorperazine] Itching    Coconut Nausea And Vomiting       Social Hx:  -Working at [employer] since May 17, 2022  -Nonsmoker    Family History   Problem Relation Age of Onset    Hyperthyroidism Mother     Osteoporosis Mother     Arrhythmia Mother     Squamous cell carcinoma Mother         basal cell vs squamous cell skin cancer    Arthritis Mother     Cancer Mother     Coronary artery disease Father         s/p CABG    Diabetes Father     Hypertension Father     Prostate cancer Father     Angina Father     Cancer Father     Heart disease Father     Colon cancer Paternal Grandmother     Diabetes Paternal Grandmother     Diabetes Maternal Aunt     Diabetes Paternal Uncle     Diabetes Paternal Aunt     Thyroid disease Neg Hx    Father died 2017-05-16 with diabetes and heart disease  Mother died in 2012/05/16    Objective:     Physical Exam:  There were no vitals taken for this visit.   BP Readings from Last 3 Encounters:   04/22/23 119/71   04/18/23 126/75   03/28/23 159/87     General appearance - pleasant, conversant, appear tired; voice and speech normal  Neck - supple on gross visualization, rest of neck exam limited by Video Visit  Neurological - no visible tremors    09/2022 in person exam:  General appearance - Pleasant, conversant, NAD. Generalized and abdominal obesity.   Eyes -  No stare or lid lag, no lid edema  Neck - Supple w/o palpable LAD, no masses appreciated. Well healed incision from thyroidectomy.and previous LND.   Lymphatics - no cervical or supraclavicular adenopathy appreciated   Resp - clear to auscultation bilaterally  CV - normal rate, regular rhythm  Neurological - no hand tremors, 2+ upper extremity DTRs  Extremities - peripheral pulses normal, no lower extremity edema  Skin - warm, dry, no acanthosis Data Review:    TSH   Date Value   10/07/2022 0.921 uIU/mL   07/17/2022 5.233 uIU/mL (H)   04/03/2022 4.582 uIU/mL   10/04/2013 0.11 u[iU]/mL (L)   06/28/2013 0.28 u[iU]/mL (L)   04/27/2013 0.09 u[iU]/mL (L)     Free T4 (ng/dL)   Date Value   16/10/9602 0.89   07/17/2022 0.94   04/03/2022 0.81 (L)   10/04/2013 1.55 (H)   06/28/2013 1.40   04/27/2013 1.69 (H)     THYROGLOBULIN AB (IU/mL)   Date Value   10/07/2022 <1.8   10/10/2021 <1.8   08/30/2019 <1.8     Thyroglobulin, Tumor Marker, IA (ng/mL)   Date Value   10/07/2022 0.2 (H)   10/10/2021 0.6 (H)   08/30/2019 0.3 (H)      Lab Results   Component Value Date    CALCIUM 8.1 (L) 04/18/2023    CALCIUM 9.2 04/17/2023    CALCIUM 8.5 (L) 04/16/2023    PHOS 4.7 04/18/2023    PHOS 6.1 (H)  04/17/2023    PHOS 6.0 (H) 04/16/2023    CREATININE 0.86 04/18/2023    CREATININE 0.81 04/17/2023    CREATININE 0.80 04/16/2023    VITDTOTAL 23.8 08/30/2022    VITDTOTAL 17.4 (L) 01/16/2022    VITDTOTAL 31.0 02/27/2017    PTH 9.9 (L) 12/04/2015    PTH 12.4 12/12/2014    PTH 12 04/27/2013     Lab Results   Component Value Date    A1C 5.8 (H) 04/06/2023     Calcium values while hospitalized in 06/2016 were < 7, had low ionized calcium as well.     01/2016 - Thyrogen stimulated testing  - Tg rose to 2.4    PATH from 03/2013 right neck dissection:  Amended: 03/31/2013 by Renaye Carp, MD  Reason: Additional Information  Comment: The report is amended to add the results of a BRAF V600E immunostain.  The final diagnosis is unchanged.  Previous Signout Date: 03/30/2013  Diagnosis:  Lymph nodes, right cervical, levels 2-4, removal and partial parotidectomy  -Metastatic melanoma involving 1 out of 20 lymph nodes, with the largest diameter measuring 10 mm (1.0 cm) and no evidence of extracapsular extension (1/20)  -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, with the largest diameter measuring 3.5 mm and no evidence of extracapsular extension (3/20)   -BRAF V600E immunohistochemical staining is positive (3+ staining, 100% of cells) --> this was on LN with melanoma  -Benign parotid gland is also present  *Molecular testing:  RESULTS:  Gene Variants of Known Clinical Utility:   BRAF: No mutation detected (see note) --> A BRAF V600E immunohistochemical stain was performed on the tissue section of this case and was positive (see case [id number]). Though within validated parameters, the tumor input for sequencing in this case was low. This could result in the V600E mutant allele frequency falling below the limit of detection for the assay. [Again on LN with melanoma]  KIT: No mutation detected     Radiology:    04/03/22 US  Thyroid    COMPARISON: Ultrasound 01/03/2021 and priors    FINDINGS: Patient is status post total thyroidectomy. No residual thyroid tissue is visualized in the thyroidectomy bed.    No pathologically enlarged lymph nodes were identified throughout the neck.    Impression  1. Status post total thyroidectomy without sonographic evidence of residual thyroid tissue or recurrent disease with the thyroid bed.  2. No pathologically enlarged lymph nodes identified within the neck.    01/2016 Whole body scan:  FINDINGS:  There are no abnormal foci of increased radiotracer uptake identified.  There is physiologic uptake seen in the nasopharynx, the bowel, stomach, and bladder.  Impression  No evidence of I-131 avid metastasis.    05/2013 had stimulated PET and WBS, as well as 1 week post treatment WBS

## 2023-04-25 DIAGNOSIS — C439 Malignant melanoma of skin, unspecified: Principal | ICD-10-CM

## 2023-04-28 DIAGNOSIS — C439 Malignant melanoma of skin, unspecified: Principal | ICD-10-CM

## 2023-04-29 ENCOUNTER — Other Ambulatory Visit: Admit: 2023-04-29 | Discharge: 2023-04-30 | Payer: BLUE CROSS/BLUE SHIELD

## 2023-04-29 ENCOUNTER — Inpatient Hospital Stay: Admit: 2023-04-29 | Discharge: 2023-04-30 | Payer: BLUE CROSS/BLUE SHIELD

## 2023-04-29 DIAGNOSIS — C7931 Secondary malignant neoplasm of brain: Principal | ICD-10-CM

## 2023-04-29 DIAGNOSIS — C434 Malignant melanoma of scalp and neck: Principal | ICD-10-CM

## 2023-04-29 DIAGNOSIS — C439 Malignant melanoma of skin, unspecified: Principal | ICD-10-CM

## 2023-04-29 DIAGNOSIS — C73 Malignant neoplasm of thyroid gland: Principal | ICD-10-CM

## 2023-04-29 LAB — URINALYSIS WITH MICROSCOPY
BILIRUBIN UA: NEGATIVE
BLOOD UA: NEGATIVE
GLUCOSE UA: NEGATIVE
HYALINE CASTS: 24 /LPF — ABNORMAL HIGH (ref 0–1)
KETONES UA: NEGATIVE
NITRITE UA: NEGATIVE
PH UA: 5 (ref 5.0–9.0)
RBC UA: 3 /HPF (ref <=3)
SPECIFIC GRAVITY UA: 1.026 (ref 1.003–1.030)
SQUAMOUS EPITHELIAL: <1 /HPF (ref 0–5)
UROBILINOGEN UA: <2
WBC UA: 4 /HPF — ABNORMAL HIGH (ref <=2)

## 2023-04-29 MED ADMIN — bevacizumab-bvzr (ZIRABEV) 673 mg in sodium chloride (NS) 0.9 % 100 mL IVPB: 5 mg/kg | INTRAVENOUS | @ 21:00:00 | Stop: 2023-04-29

## 2023-04-29 NOTE — Unmapped (Signed)
 Error

## 2023-04-29 NOTE — Unmapped (Signed)
 Pharmacy: First Cycle Chemotherapy Patient Education    Chemotherapy regimen/agents: Bevacizumab  Venous Access: peripheral IV  Caregivers present for education: sister    [name] is a 48 y.o. with metastatic melanoma. Chemotherapy education was provided to the patient by the oncology pharmacy team.    Side effects discussed included but were not limited to:  Infusion-related reactions  Delayed wound healing  GI perforation  Bleeding  Thrombotic events  Hypertension.    The patient handout from [employer].The patient verbalized understanding of this information.  Medication reconciliation was completed and the medication list was updated in EPIC.      Approximate time spent with patient: 20  Minutes.    Mikie Ales, PharmD, [facility]  Outpatient Hematology/Oncology Pharmacist

## 2023-04-29 NOTE — Unmapped (Signed)
 Patient arrived to Bed 57 for Bevazicumab.  No complaints noted.  L FA  piv flushes w/o any issues, positive for blood return. He tolerated his infusion w/o any issues. He left the clinic in stable and ambulatory condition accompanied by his sister.

## 2023-04-29 NOTE — Unmapped (Signed)
 Hospital Outpatient Visit on 04/29/2023   Component Date Value Ref Range Status    Color, UA 04/29/2023 Dark Yellow   Final    Clarity, UA 04/29/2023 Turbid   Final    Specific Gravity, UA 04/29/2023 1.026  1.003 - 1.030 Final    pH, UA 04/29/2023 5.0  5.0 - 9.0 Final    Leukocyte Esterase, UA 04/29/2023 Small (A)  Negative Final    Nitrite, UA 04/29/2023 Negative  Negative Final    Protein, UA 04/29/2023 Trace (A)  Negative Final    Glucose, UA 04/29/2023 Negative  Negative Final    Ketones, UA 04/29/2023 Negative  Negative Final    Urobilinogen, UA 04/29/2023 <2.0 mg/dL  <1.6 mg/dL Final    Bilirubin, UA 04/29/2023 Negative  Negative Final    Blood, UA 04/29/2023 Negative  Negative Final    RBC, UA 04/29/2023 3  <=3 /HPF Final    WBC, UA 04/29/2023 4 (H)  <=2 /HPF Final    Squam Epithel, UA 04/29/2023 <1  0 - 5 /HPF Final    Bacteria, UA 04/29/2023 Occasional (A)  None Seen /HPF Final    Hyaline Casts, UA 04/29/2023 24 (H)  0 - 1 /LPF Final    Ca Oxalate Crystal, UA 04/29/2023 Moderate  /HPF Final    Mucus, UA 04/29/2023 Moderate (A)  None Seen /HPF Final   Lab on 04/29/2023   Component Date Value Ref Range Status    Spec Gravity/POC 04/29/2023 >=1.030  1.003 - 1.030 Final    PH/POC 04/29/2023 5.0  5.0 - 9.0 Final    Leuk Esterase/POC 04/29/2023 Negative  Negative Final    Nitrite/POC 04/29/2023 Negative  Negative Final    Protein/POC 04/29/2023 1+ (A)  Negative Final    UA Glucose/POC 04/29/2023 Negative  Negative Final    Ketones, POC 04/29/2023 Trace (A)  Negative Final    Bilirubin/POC 04/29/2023 Negative  Negative Final    Blood/POC 04/29/2023 Negative  Negative Final    Urobilinogen/POC 04/29/2023 0.2  0.2 - 1.0 mg/dL Final

## 2023-04-30 DIAGNOSIS — C439 Malignant melanoma of skin, unspecified: Principal | ICD-10-CM

## 2023-05-02 DIAGNOSIS — C439 Malignant melanoma of skin, unspecified: Principal | ICD-10-CM

## 2023-05-05 DIAGNOSIS — C439 Malignant melanoma of skin, unspecified: Principal | ICD-10-CM

## 2023-05-05 NOTE — Unmapped (Signed)
 This is a referral of a 48 yo white caucasian male with known history of melanoma brain metastases that has progressed recently while on encorafenib -binimetinib  for management of his melanoma brain metastases. Patient was originally diagnosed with melanoma from the scalp in 07/27/10, superficial spreading type, Breslow thickness 1.3 mm, 1 mitoses/mm2, no TILs ([id number]). He underwent wide excision and sentinel lymph node mapping in 09/11/10. Surgical pathology report was c/w with residual melanoma; 3/4 R posterior scalp lymph nodes were positive for melanoma; of note, melanoma cells were present in 2-5% of lymph node parenchyma ([id number], [facility]). He then underwent a re-do wide excision and R level II cervical lymph node dissection in 11/06/10; dermatopathology report was negative for melanoma in 26 lymph nodes ([id number], [place]). He completed adjuvant high dose interferon in 01/2012. Concurrent with the adjuvant therapy he underwent thyroidectomy for papillary thyroid cancer in 06/25/11 ([id number]) s/p radioactive iodine  03/2012. He developed several thyroid cancer recurrences in 11/2012 and 03/2013; in the first one he underwent L neck dissection. In the second recurrence he underwent R cervical level 2-4 node dissection and partial parotidectomy; in that surgery collision tumors consisting of metastatic melanoma in 1/20 lymph node and metastatic papillary thyroid carcinoma involving 3/20 lymph nodes with the largest dimension of 3.5 mm were found. His melanoma was stained positive for BRAFV600E ([id number]). S/p radiation to R neck that was completed in 06/2013.   Upon a surveillance whole body CT in 06/11/17 followed by a whole body PET/CT scan in 12/25/17, he was found to have uptake in the colon and abdominal/pelvic lymph nodes. While colonoscopy performed in 01/09/18 was negative short of moderately-severely active chronic colitis, the pelvic lymph node was positive for melanoma ([id number]). He received two infusions of single-agent nivolumab in 02/2018 and he ended up developing colitis. Followup restaging whole body CT scans showed progression. He was therefore placed on encorafenib -binimetinib  in early 05/2018. He has had minimal volume of extracranial disease with peritoneal implants and lymph nodes throughout the abdomen and pelvis, L2 vertebral body. None of these areas were proved to be more than melanophages. At some point he developed colonic inflammation    He has had known brain metastases since 01/29/18; in that brain MRI study there was a single focus of parenchymal brain metastasis that actually did not change after two infusion of nivolumab based on the followup brain MRI in 05/13/18. While on encorafenib -binimetinib  he developed another single parenchymal brain lesion in the 01/28/19 study; by 08/05/19 lesions have been more in number and remained micrometastatic. He ended up receiving whole brain irradiation therapy that was completed early 01/2020. His intracranial disease remained stable until 04/17/22; on that day brain MRI showed at least two lesions showing interval increase in size. He received cyberknife to two L frontal lesions and R cingulate gyrus to 20 Gy and one fraction in 05/07/22. His brain MRI remained stable until 10/30/22.     In his next radiographic followup in 01/30/23 a new R frontal cortex hemorrhagic lesion was noted; he was asymptomatic at the time. It was favored at the time to be due to radiation changes and it was observed. Unfortunately patient came to the ED in 02/12/23.]for worsening headaches and vomiting. He was seen by Dr. Chyrl Crawford in 02/18/23 who noticed interim improvement of the hemorrhage and he therefore recommended observation. In 03/27/23 he presented again at the ED with acute onset impulsivity and memory decline. Brain MRI in 03/25/23 showed a 4.6 x 2.9 x 5.3 cm R frontal lobe mass previously  3.6 x 3 x 6 cm with mild increase in perilesional hemorrhage and similar vasogenic edema with mass effect and partial effacement of the R lateral ventricle and 0.8 cm leftward midline shift. Rest of parenchymal brain lesion were subcentimeter (I.e., a 0.8 cm R temporal lobe previously 0.5 cm; a L temporal lobe currently 0.7 cm, previously 0.5 cm; a R occipital 0.4 cm, previously 0.3 cm; a 0.7 cm L internal capsule previously 0.5 cm). He underwent resection of the dominant mass in 04/04/23. Surgical pathology report was c/w metastatic melanoma ([id number]).     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 Difficulties with following instructions and completing tasks began in January 2025, leading to a demotion due to his inability to perform job duties effectively.    He exhibits cognitive and functional impairments, including difficulties with concentration, memory, and following instructions. His caregiver notes previous episodes of incontinence and confusion, such as leaving water  running and doors open, which occurred prior to surgery. He wants to return to work and live independently, but acknowledges the current limitations imposed by his condition. He continues to experience challenges with cognitive function and daily activities. Attempts to reduce Decadron  dosage have led to a return of symptoms.    He has ongoing headaches and concentration difficulties. No regular TV watching due to lack of interest. He is able to shower and manage personal hygiene independently. He has experienced balance issues in the past, but reports improvement.    PMHx  Ankylosing spondylitis  Diverticulitis  Disk herniation    MEDICATIONS  Tylenol  650mg  q6hrs prn, ascorbic acie 500mg  bid, decadron  2 mg twice daily, Braftovi  450 mg daily, [name]  45 mg daily. Calcitriol  0.25 mg every day, tums 400 mg bid, cymbalta  30 mg bid, zetia  10 mg every day, allegra 180 mg prn, fluticasone  1 spray bid, folic acid  1,000 mcg every day, insulin  novolin  4 units subcutaneously every day, synthroid  125 mcg every day, lisinopril  10mg  every day, multivitamins every day, zofran  8 mg q8hrs prn, glycolax  17 gr bid prn, senna 8.6 mg bid, sulfasalazine  1,000 mg bid, zinc  220 mg every day.     PSHx  As above, in addition partial colectomy in 2012    FHx  Father with CAD, skin and prostate cancer.    SHx  Not married. Used to be a Psychologist, forensic. Spent a lot of time outdoors. He lives in a group home, wishes to go back to live to his apartment with his dog.    Physical Examination  VITALS: T- 36.7, P- 101, BP- 119/71, RR- 16, SaO2- 96%  MEASUREMENTS: Weight- 134.6.  GENERAL: No acute distress, well developed, well nourished.  NECK: No cervical, supraclavicular, axillary, or inguinal lymphadenopathy.  CHEST: Lungs clear to auscultation.  CARDIOVASCULAR: Regular rate and rhythm.  ABDOMEN: Abdomen soft, no organomegaly.  EXTREMITIES: No edema or tenderness.  NEUROLOGICAL: Wide base gait, comfortable walking, no imbalance issues.    RADIOGRAPHIC DATA  Last brain MRI from 04/16/23 showed new and increasing supratentorial and infratentorial metastases. New optic nerve sheath edema and optic nerve flattening c/w papilledema. Stable small overlying extra-axial hematoma. Stable 4 mm leftward midline shift. Stable mild dilation of the R lateral ventricle and ventricular effacement of intracranial metastases.     MRI of abdomen was performed 01/29/23. Stable size of T1 hyperintense foci within R hepatic lobe without  discernible  enhancement.     TEMPUS XT DNA and RNA panel: BRAF V600E mutation, BRCA2 copy number loss, CDKN2A double mutation (stop gain and missense variant), FOXO1 copy number loss, ING1 copy number loss, PTEN loss of function (stop gain deletion), RB1 copy number loss, TERT promoter variant    ASSESSMENT AND PLAN  Metastatic melanoma to the brain that is symptomatic, refractory to whole brain irradiation therapy, and to enco-bini. Minimal extracranial disease. Nevertheless, preserved functional status. Issues to be discussed:  -We acknowledge and confirm that redo whole brain irradiation therapy may be too morbid and of unknown clinical benefit. Will ask radiation oncology about the potential for focal radiation therapy.   -His prognosis is challenging and perhaps very short. However, prior to considering hospice I think we could consider the following stepwise approach; each step can be difficult to complete before moving to the next one.  A. Attempt treatment with bevacizumab while patient continues encorafenib -binimetinib . The goal here is to render him steroid-independent. The recent study of pembrolizumab-bevacizumab proved its safety in terms of increasing hemorrhage (it does not). We discussed other side effects such as hypertension. He can start immediately with weekly infusions and while he achieves steady-state levels, we can wean off corticosteroids.  B. Assuming that he becomes independent of corticosteroids, administer at least a PD1 inhibitor as per the recent study from [place] ([facility] March 2025) and stop enco-bini. Given his history of colitis, this approach should occur with non-enteral absorbing corticosteroids. If safe, may even add ipilimumab.     Of note his mutation profile may point to opportunities for therapeutic intervention (e.g. BRCA2 copy number loss); for example the use of PARP inhibitors with nivolumab.    -Cognitive and functional impairment. Significant cognitive and functional impairments due to brain metastases, previous radiation, and steroid use. Goal to improve function by managing edema and reducing steroid dependence. Assess cognitive and functional status regularly. Provide supportive care and consider rehabilitation services.    -Goals of Care. Aims to return to work and live independently. Primary goal to manage brain edema and reduce steroid dependence. Long-term goals include exploring alternative therapies and achieving independence. Focus on short-term goals of managing brain edema and reducing steroid dependence. Reassess long-term goals after achieving short-term goals.

## 2023-05-06 ENCOUNTER — Other Ambulatory Visit: Admit: 2023-05-06 | Discharge: 2023-05-07 | Payer: BLUE CROSS/BLUE SHIELD

## 2023-05-06 ENCOUNTER — Inpatient Hospital Stay: Admit: 2023-05-06 | Discharge: 2023-05-07 | Payer: BLUE CROSS/BLUE SHIELD

## 2023-05-06 ENCOUNTER — Ambulatory Visit
Admit: 2023-05-06 | Discharge: 2023-05-07 | Payer: BLUE CROSS/BLUE SHIELD | Attending: Hematology & Oncology | Primary: Hematology & Oncology

## 2023-05-06 ENCOUNTER — Ambulatory Visit: Admit: 2023-05-06 | Discharge: 2023-05-07 | Payer: BLUE CROSS/BLUE SHIELD

## 2023-05-06 DIAGNOSIS — C439 Malignant melanoma of skin, unspecified: Principal | ICD-10-CM

## 2023-05-06 LAB — COMPREHENSIVE METABOLIC PANEL
ALBUMIN: 3.7 g/dL (ref 3.4–5.0)
ALKALINE PHOSPHATASE: 127 U/L — ABNORMAL HIGH (ref 46–116)
ALT (SGPT): 108 U/L — ABNORMAL HIGH (ref 10–49)
ANION GAP: 13 mmol/L (ref 5–14)
AST (SGOT): 58 U/L — ABNORMAL HIGH (ref <=34)
BILIRUBIN TOTAL: 0.7 mg/dL (ref 0.3–1.2)
BLOOD UREA NITROGEN: 18 mg/dL (ref 9–23)
BUN / CREAT RATIO: 12
CALCIUM: 8.8 mg/dL (ref 8.7–10.4)
CHLORIDE: 105 mmol/L (ref 98–107)
CO2: 25 mmol/L (ref 20.0–31.0)
CREATININE: 1.5 mg/dL — ABNORMAL HIGH (ref 0.73–1.18)
EGFR CKD-EPI (2021) MALE: 57 mL/min/1.73m2 — ABNORMAL LOW (ref >=60)
GLUCOSE RANDOM: 131 mg/dL (ref 70–179)
POTASSIUM: 3.9 mmol/L (ref 3.4–4.8)
PROTEIN TOTAL: 6.9 g/dL (ref 5.7–8.2)
SODIUM: 143 mmol/L (ref 135–145)

## 2023-05-06 LAB — CBC W/ AUTO DIFF
BASOPHILS ABSOLUTE COUNT: 0 10*9/L (ref 0.0–0.1)
BASOPHILS RELATIVE PERCENT: 0.4 %
EOSINOPHILS ABSOLUTE COUNT: 0.1 10*9/L (ref 0.0–0.5)
EOSINOPHILS RELATIVE PERCENT: 1.5 %
HEMATOCRIT: 38.4 % — ABNORMAL LOW (ref 39.0–48.0)
HEMOGLOBIN: 13 g/dL (ref 12.9–16.5)
LYMPHOCYTES ABSOLUTE COUNT: 0.6 10*9/L — ABNORMAL LOW (ref 1.1–3.6)
LYMPHOCYTES RELATIVE PERCENT: 11.8 %
MEAN CORPUSCULAR HEMOGLOBIN CONC: 33.9 g/dL (ref 32.0–36.0)
MEAN CORPUSCULAR HEMOGLOBIN: 29.7 pg (ref 25.9–32.4)
MEAN CORPUSCULAR VOLUME: 87.8 fL (ref 77.6–95.7)
MEAN PLATELET VOLUME: 7.9 fL (ref 6.8–10.7)
MONOCYTES ABSOLUTE COUNT: 0.8 10*9/L (ref 0.3–0.8)
MONOCYTES RELATIVE PERCENT: 14.5 %
NEUTROPHILS ABSOLUTE COUNT: 3.8 10*9/L (ref 1.8–7.8)
NEUTROPHILS RELATIVE PERCENT: 71.8 %
PLATELET COUNT: 202 10*9/L (ref 150–450)
RED BLOOD CELL COUNT: 4.37 10*12/L (ref 4.26–5.60)
RED CELL DISTRIBUTION WIDTH: 16.4 % — ABNORMAL HIGH (ref 12.2–15.2)
WBC ADJUSTED: 5.3 10*9/L (ref 3.6–11.2)

## 2023-05-06 LAB — SLIDE REVIEW

## 2023-05-06 MED ADMIN — sodium chloride (NS) 0.9 % infusion: 100 mL/h | INTRAVENOUS | @ 22:00:00

## 2023-05-06 MED ADMIN — bevacizumab-bvzr (ZIRABEV) 1,346 mg in sodium chloride (NS) 0.9 % 100 mL IVPB: 10 mg/kg | INTRAVENOUS | @ 22:00:00 | Stop: 2023-05-06

## 2023-05-06 NOTE — Unmapped (Signed)
 Patient ambulated on floor, accompanied by sister. PIV in place, blood return noted. Tolerated infusion, w/o difficulty. PIV dc/ed. AVS given. All questions answered. Ambulated off floor at discharge. Plan of care reviewed, safety maintained.

## 2023-05-06 NOTE — Unmapped (Signed)
 RED ZONE Means: RED ZONE: Take action now!     You need to be seen right away  Symptoms are at a severe level of discomfort    Call 911 or go to your nearest  Hospital for help     - Bleeding that will not stop    - Hard to breathe    - New seizure - Chest pain  - Fall or passing out  -Thoughts of hurting    yourself or others      Call 911 if you are going into the RED ZONE                  YELLOW ZONE Means:     Please call with any new or worsening symptom(s), even if not on this list.  Call [phone]  After hours, weekends, and holidays - you will reach a long recording with specific instructions, If not in an emergency such as above, please listen closely all the way to the end and choose the option that relates to your need.   You can be seen by a provider the same day through our Same Day Acute Care for Patients with Cancer program.      YELLOW ZONE: Take action today     Symptoms are new or worsening  You are not within your goal range for:    - Pain    - Shortness of breath    - Bleeding (nose, urine, stool, wound)    - Feeling sick to your stomach and throwing up    - Mouth sores/pain in your mouth or throat    - Hard stool or very loose stools (increase in       ostomy output)    - No urine for 12 hours    - Feeding tube or other catheter/tube issue    - Redness or pain at previous IV or port/catheter site    - Depressed or anxiety   - Swelling (leg, arm, abdomen,     face, neck)  - Skin rash or skin changes  - Wound issues (redness, drainage,    re-opened)  - Confusion  - Vision changes  - Fever >100.4 F or chills  - Worsening cough with mucus that is    green, yellow, or bloody  - Pain or burning when going to the    bathroom  - Home Infusion Pump Issue- call    [phone]         Call your healthcare provider if you are going into the YELLOW ZONE     GREEN ZONE Means:  Your symptoms are under controls  Continue to take your medicine as ordered  Keep all visits to the provider GREEN ZONE: You are in control  No increase or worsening symptoms  Able to take your medicine  Able to drink and eat    - DO NOT use MyChart messages to report red or yellow symptoms. Allow up to 3    business days for a reply.  -MyChart is for non-urgent medication refills, scheduling requests, or other general questions.         [id number] Rev. 07/13/2021  Approved by Oncology Patient Education Committee     Lab on 05/06/2023   Component Date Value Ref Range Status    Sodium 05/06/2023 143  135 - 145 mmol/L Final    Potassium 05/06/2023 3.9  3.4 - 4.8 mmol/L Final    Chloride 05/06/2023 105  98 - 107 mmol/L Final  CO2 05/06/2023 25.0  20.0 - 31.0 mmol/L Final    Anion Gap 05/06/2023 13  5 - 14 mmol/L Final    BUN 05/06/2023 18  9 - 23 mg/dL Final    Creatinine 16/10/9602 1.50 (H)  0.73 - 1.18 mg/dL Final    BUN/Creatinine Ratio 05/06/2023 12   Final    eGFR CKD-EPI (2021) Male 05/06/2023 57 (L)  >=60 mL/min/1.60m2 Final    eGFR calculated with CKD-EPI 2021 equation in accordance with [employer] and [employer] of Nephrology Task Force recommendations.    Glucose 05/06/2023 131  70 - 179 mg/dL Final    Calcium  05/06/2023 8.8  8.7 - 10.4 mg/dL Final    Albumin 54/09/8117 3.7  3.4 - 5.0 g/dL Final    Total Protein 05/06/2023 6.9  5.7 - 8.2 g/dL Final    Total Bilirubin 05/06/2023 0.7  0.3 - 1.2 mg/dL Final    AST 14/78/2956 58 (H)  <=34 U/L Final    ALT 05/06/2023 108 (H)  10 - 49 U/L Final    Alkaline Phosphatase 05/06/2023 127 (H)  46 - 116 U/L Final    WBC 05/06/2023 5.3  3.6 - 11.2 10*9/L Preliminary    RBC 05/06/2023 4.37  4.26 - 5.60 10*12/L Preliminary    HGB 05/06/2023 13.0  12.9 - 16.5 g/dL Preliminary    HCT 21/30/8657 38.4 (L)  39.0 - 48.0 % Preliminary    MCV 05/06/2023 87.8  77.6 - 95.7 fL Preliminary    MCH 05/06/2023 29.7  25.9 - 32.4 pg Preliminary    MCHC 05/06/2023 33.9  32.0 - 36.0 g/dL Preliminary    RDW 84/69/6295 16.4 (H)  12.2 - 15.2 % Preliminary    MPV 05/06/2023 7.9  6.8 - 10.7 fL Preliminary    Platelet 05/06/2023 202  150 - 450 10*9/L Preliminary    Anisocytosis 05/06/2023 Slight (A)  Not Present Preliminary    Spec Gravity/POC 05/06/2023 1.015  1.003 - 1.030 Final    PH/POC 05/06/2023 5.5  5.0 - 9.0 Final    Leuk Esterase/POC 05/06/2023 Negative  Negative Final    Nitrite/POC 05/06/2023 Negative  Negative Final    Protein/POC 05/06/2023 Negative  Negative Final    UA Glucose/POC 05/06/2023 Negative  Negative Final    Ketones, POC 05/06/2023 Negative  Negative Final    Bilirubin/POC 05/06/2023 Negative  Negative Final    Blood/POC 05/06/2023 Negative  Negative Final    Urobilinogen/POC 05/06/2023 0.2  0.2 - 1.0 mg/dL Final

## 2023-05-07 DIAGNOSIS — C439 Malignant melanoma of skin, unspecified: Principal | ICD-10-CM

## 2023-05-07 NOTE — Unmapped (Signed)
 48yM w/ Crohn's, ankylosing spondylitis, HLD, stage IV melanoma (BRAF+) s/p CK/whole brain and currently on braftovi  (holding mektovi ) with hemorrhagic right frontal lesion now s/p RF craniotomy for resection. Path c/w melanoma.     PMH: Patient was noted to have a large parasagittal hemorrhage on outpatient MRI. He was asymptomatic at the time but headaches gradually worsened and he had an episode of vomiting. Unclear if related to headaches or GI. CT with resolution of most of the blood and improved mass effect. Today he is aa, fs, mae ag. Reviewed imaging. Given improvement would not recommend any surgical resection. Would consider additional radiation to that area versus observation. Advised of signs and symptoms that would warrant more urgent evaluation. Sister a critical care nurse also assisting him. He endorses some left radicular pain to the mid calf and has some trace DF, EHL weakness. Reports history of multiple herniated discs. Will order updated lumbar MRI and refer to my spine colleagues. He is on steroids currently tapering to off.      3/18: Patient rebled and was seen in ED. Opted for discharge home after extensive risks, benefits, alternative discussion of options and recommendation for surgery. He returned shortly thereafter due to issues with driving and self care. Re-visited surgery plan and now amenable. Discussed given prior scalp incision recommend resection with gamma tile placement and plastic surgery assistance. Patient still symptomatic and under care of his sister.      IH: Patient doing well postoperatively. Surgery anterior to prior scalp excision so done without plastics input only. He was discharged to an assisted living facility. Staples removed prior to discharge.  Incision well healed. Headaches have improved. Reviewed pathology results. Updated MRI with progression of multiple lesions. He is seeing Dr Patria Bookbinder for management.    Plan:  Neurosurgery follow up as needed

## 2023-05-07 NOTE — Unmapped (Signed)
 Spoke to pts sister to confirm dr Patria Bookbinder telephone visit on April 29th.

## 2023-05-08 DIAGNOSIS — C434 Malignant melanoma of scalp and neck: Principal | ICD-10-CM

## 2023-05-08 DIAGNOSIS — C7931 Secondary malignant neoplasm of brain: Principal | ICD-10-CM

## 2023-05-08 DIAGNOSIS — C439 Malignant melanoma of skin, unspecified: Principal | ICD-10-CM

## 2023-05-12 DIAGNOSIS — C7931 Secondary malignant neoplasm of brain: Principal | ICD-10-CM

## 2023-05-12 DIAGNOSIS — C439 Malignant melanoma of skin, unspecified: Principal | ICD-10-CM

## 2023-05-12 DIAGNOSIS — C434 Malignant melanoma of scalp and neck: Principal | ICD-10-CM

## 2023-05-12 NOTE — Unmapped (Signed)
 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 Minimal extracranial disease. Nevertheless, preserved functional status. Issues to be discussed:   -We are pleased by his tolerance to bevacizumab. Will administer 10 mg/kg bevacizumab today.  -Headaches and nausea. Severe headaches and nausea resolved after discontinuing [name]. Am not sure about the association between binimetinib and headaches because this was occurring perioperatively. Encorafenib cannot be administered by itselt at such high dose; he will restart binimetinib at 30mg  bid. Will monitor for recurrence of headaches and nausea.  -Will keep corticosteroids at the same dose until he reaches steady-state levels with avastin. If he continues to feel well without ha in a week, we will begin to taper corticosteroids.  -I am not sure about the LFT elevation. No recent tests for comparison. Unclear if related to bevacizumab. Monitor liver function tests closely.  -Itchiness post-infusion. Itchiness post-bevacizumab infusion, tolerable, no intervention needed unless symptoms worsen.    We will have a phone visit next week.    Diagnosis: melanoma  Other: none  Stage: IVd  Current Therapy: encorafenib-binimetinib plus s/p first infusion of avastin.      REFERRING PROVIDER  Candance Certain, MD    ONCOLOGY HISTORY  -07/27/10; scalp melanoma, superficial spreading type, Breslow thickness 1.3 mm, 1 mitoses/mm2, no TILs ([id number]).  -09/11/10; wide excision and sentinel lymph node mapping c/w residual melanoma; 3/4 R posterior scalp lymph nodes were positive for melanoma; of note, melanoma cells were present in 2-5% of lymph node parenchyma ([id number], [facility])   -11/06/10; re-do wide excision and R level II cervical lymph node dissection c/w 26 negative lymph nodes ([id number]).  -01/2012; completed adjuvant high dose interferon.  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 His melanoma was stained positive for BRAFV600E ([id number]).   -06/2013; completed radiation to the R neck.   -01/09/18; surveillance PET/CT scan showed positive abdominal/pelvic lymph nodes. C/w melanoma ([id number]).  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  -05/2018- started enco-bini  -01/28/19; interim growth of oligometastatic disease in the brain  -01/2020; completed whole brain irradiation  -05/07/22; interval increase in size of two brain lesions; s/p cyberknife to two L frontal lesions and R cingulate gyrus to 20 Gy and one fraction   -01/30/23; new R frontal cortex hemorrhagic lesion that was asymptomatic at the time  -02/12/23; worsening headaches  -04/04/23; resection of the dominant R frontal lobe mass c/w metastatic melanoma ([id number])     SUBJECTIVE  Kenneth Fitzgerald is a 48 y.o. male comes to receive the second infusion of avastin on top of encorafenib-binimetinib. Following the avastin infusion, he experienced dizziness and itchiness, which persisted for most of the week. He also had a few headaches, similar in frequency to those before the infusion along with nausea and vomiting. These were attributed to binimetinib and he stopped the medication for a week now. Of note, he has not experienced these symptoms since stopping [name].    His blood pressure remains stable with diastolic readings in the high eighties, but his heart rate has increased to between 111 and 126 bpm. No palpitations are noted. He feels better this week compared to last, with  increased energy levels, although he was exhausted after a long day of activities with his brother. He is actively participating in physical therapy twice a week and is attempting to increase his activity level. He has not engaged in occupational therapy since the initial session and has not received any speech therapy. He is working on higher-level independent living skills, such as cooking and laundry, but has not yet resumed these activities fully. He is trying to increase his fluid intake, especially as the weather gets warmer. He urinates more frequently, about six to seven times a day, with urine that is darker than usual.    MEDICATIONS  Tylenol, ascorbic acid, calcitriol, Tums, dexamethasone, duloxetine, encorafenib, Zetia, fexofenadine, Flonase, folic acid, insulin, levothyroxine, lisinopril, multivitamins, Zofran, polyethylene glycol, Senna, Azulfidine, and zinc sulfate.    OBJECTIVE  GENERAL:  Kenneth Fitzgerald [facility] appears alert, oriented, and cooperative.  VITALS: T- 37.3, P- 114, BP- 119/77, RR- 18, SaO2- 95  MEASUREMENTS: Weight- 135.5.  GENERAL: Performance status ECOG 0.  HEENT: Pupils reactive to light and accommodation. No sinus tenderness. Oral cavity normal.  NECK: No cervical or supraclavicular lymphadenopathy.  CHEST: Lungs clear to auscultation.  CARDIOVASCULAR: Heart regular rhythm.  BREAST: No axillary lymphadenopathy.  ABDOMEN: Abdomen soft, non-tender, no organomegaly.  EXTREMITIES: No edema, no calf tenderness.    LABORATORY DATA  All labs (CBC w/diff, CHEM20, TFTs) are normal except hematocrit 38.4, abs lymph 0.6k, Cr 1.5, AST/ALT 58/108, AlPhos 127.

## 2023-05-13 ENCOUNTER — Ambulatory Visit
Admit: 2023-05-13 | Discharge: 2023-05-14 | Payer: BLUE CROSS/BLUE SHIELD | Attending: Hematology & Oncology | Primary: Hematology & Oncology

## 2023-05-13 MED ORDER — DEXAMETHASONE 1 MG TABLET
ORAL_TABLET | Freq: Four times a day (QID) | ORAL | 0 refills | 30.00000 days | Status: CP
Start: 2023-05-13 — End: 2024-05-12

## 2023-05-13 NOTE — Unmapped (Signed)
 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 Minimal extracranial disease. Nevertheless, preserved functional status. Issues to be discussed:   -This is his second week on bevacizumab added on top of encorafenib-binimetinib and he is clinically doing well. We are pleased by his binimetinib tolerance. I think it is safe to consider dropping decadron to 1mg  bid with the aim to go to 1mg  every day as bevacizumab reaches steady-state levels next week.  -I have shown cautious optimism about his wishes to become independent; he is financially stressed by living ina group home. I have set a tentative decision day to 3-4 weeks from now.   -As long as he is improving and his decadron requirements are less or equal to 1mg  every day, I would like to entertain him receiving/switching to low dose ipi-nivo; given the prior knowledge about colitis, I would consider starting him on enterocort.     We will see him in 10 days. I spent 15 min discussing his symptoms and future oncology plans.    Diagnosis: melanoma  Other: none  Stage: IVd  Current Therapy: encorafenib-binimetinib plus s/p first infusion of avastin.     REFERRING PROVIDER  Candance Certain, MD    ONCOLOGY HISTORY  -07/27/10; scalp melanoma, superficial spreading type, Breslow thickness 1.3 mm, 1 mitoses/mm2, no TILs ([id number]).  -09/11/10; wide excision and sentinel lymph node mapping c/w residual melanoma; 3/4 R posterior scalp lymph nodes were positive for melanoma; of note, melanoma cells were present in 2-5% of lymph node parenchyma ([id number], [facility])   -11/06/10; re-do wide excision and R level II cervical lymph node dissection c/w 26 negative lymph nodes ([id number]).  -01/2012; completed adjuvant high dose interferon.  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 His melanoma was stained positive for BRAFV600E ([id number]).   -06/2013; completed radiation to the R neck.   -01/09/18; surveillance PET/CT scan showed positive abdominal/pelvic lymph nodes. C/w melanoma ([id number]).  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  -05/2018- started enco-bini  -01/28/19; interim growth of oligometastatic disease in the brain  -01/2020; completed whole brain irradiation  -05/07/22; interval increase in size of two brain lesions; s/p cyberknife to two L frontal lesions and R cingulate gyrus to 20 Gy and one fraction   -01/30/23; new R frontal cortex hemorrhagic lesion that was asymptomatic at the time  -02/12/23; worsening headaches  -04/04/23; resection of the dominant R frontal lobe mass c/w metastatic melanoma ([id number]).  -04/22/23 restart enco-bini  -04/29/23; started bevacizumab (on top of enco-bini)    SUBJECTIVE    Kenneth Fitzgerald is a 48 y.o. male is phoned in to discuss his experience after two bevacizumab infusions. I confirmed that at the time of the phone visit he was located at his group home in the state of [place] . I obtained his verbal consent to review his medical records. Since last visit his life continues to improve. He continues with physical therapy; he now walks up 1/4 mile and he heeps up with physical therapy. He has tried balance exercises and he did well. He wishes to remain independent. He occasionally has blurry vision but the episodes are less  frequent. He started binimetinib 2 pills twice a day last week and he denies any issues. He occasionally gags and his appetite is better.   He gets dressed himself and he tries to go for a walk; he can walk the driveway for 400 yard. He has not yet seen his dog. He denies rash or diarrhea. His BP have been between 133-160/63-87. He admits he is getting bored and he spends a lot of time laying around. He drinks more water.     MEDICATIONS  Tylenol 650mg  q6hrs prn, ascorbic acid 500mg  bid, decadron 2 mg twice daily, Braftovi 450 mg daily, [name] 30 mg bid. Calcitriol 0.25 mg every day, tums 400 mg bid, cymbalta 30 mg bid, zetia 10 mg every day, allegra 180 mg prn, fluticasone 1 spray bid, folic acid 1,000 mcg every day, insulin novolin 4 units subcutaneously every day, synthroid 125 mcg every day, lisinopril 10mg  every day, multivitamins every day, zofran 8 mg q8hrs prn, glycolax 17 gr bid prn, senna 8.6 mg bid, sulfasalazine 1,000 mg bid, zinc 220 mg every day.     OBJECTIVE  This is a phone visit.

## 2023-05-15 ENCOUNTER — Encounter: Admit: 2023-05-15 | Discharge: 2023-05-16 | Payer: BLUE CROSS/BLUE SHIELD | Attending: Clinical | Primary: Clinical

## 2023-05-15 MED ORDER — SULFASALAZINE 500 MG TABLET
ORAL_TABLET | Freq: Two times a day (BID) | ORAL | 3 refills | 0.00000 days
Start: 2023-05-15 — End: ?

## 2023-05-15 NOTE — Unmapped (Signed)
 [facility]  Psychiatry  Psychotherapy Note - Telehealth via Video     Service Date: May 15, 2023  Service: 45 minutes of psychotherapy via video session  Time with Patient: 45 minutes     Encounter Description: This encounter was conducted from provider's office via EPIC video session due to COVID-19 pandemic. Arpan Arnwine was located in his home. Session was conducted via EPIC.  Rationale for video session is need to social distance. See Plan for telemedicine consent/disclaimer.      Encounter Description/Consent:   Ellin Gutter visit was completed through telehealth encounter (video).      This patient encounter is appropriate and reasonable under the circumstances. The patient has been advised of the potential risks and limitations of this mode of treatment (including, but not limited to, the absence of in-person examination) and has agreed to be treated in a remote fashion in spite of them. Any and all of the patient's/patient's family's questions on this issue have been answered.      The patient was physically located in [place]  in which I am permitted to provide care. The patient understood that he may incur co-pays and cost sharing, and agreed to the telemedicine visit. The visit was reasonable and appropriate under the circumstances given the patient's presentation at the time.      Time Spent: 45 minutes     Assessment:  Mr. Romo is a 48yo male with a history of metastatic melanoma and thyroid cancer who I have see for psychotherapy to address depression and adjustment to multiple stressors.  We have not met for several months before meeting today via telehealth.  Since our last session, Mr. Bitler was found to have CNS metastasis of melanoma and is now s/p surgical resection and is currently staying at [facility].  Mr. Endrizzi acknowledged experiencing anxiety and depressive symptoms related to the multiple stressors and hardships that he faces at this time.  He denied any thoughts of self-harm, and does have established goals that he would like to achieve (eg, returning home; reuniting with his dogs).  I offered support, psychoeducation, therapeutic setting to process his experiences, and recommendations to address/tolerate distress.  We agreed to resume meeting regularly for psychotherapy, and scheduled to meet next in two weeks.     Risk Assessment:  A suicide and violence risk assessment was performed as part of this evaluation. There patient is deemed to be at chronic elevated risk for self-harm/suicide given the following factors: current diagnosis of depression, suicidal ideation or threats without a plan and past diagnosis of depression. There patient is deemed to be at chronic elevated risk for violence given the following factors: male gender. These risk factors are mitigated by the following factors: lack of active SI/HI. There is no acute risk for suicide or violence at this time. The patient was educated about relevant modifiable risk factors including following recommendations for treatment of psychiatric illness and abstaining from substance abuse. While future psychiatric events cannot be accurately predicted, the patient does not currently require acute inpatient psychiatric care and does not currently meet [place]  involuntary commitment criteria.              Plan:  1. Will to see Mr. Beshara for psychotherapy; scheduled to meet next in two weeks.  2. Mr. Jerry has seen Vona Grumet, NP; will discuss benefits of re-establishing care.  3. Mr. Bores has information on available CCSP resources and he has worked with Diane Bensley, [name].  I have previously reached out to Emelie Hanger to explore financial resources.  4. Mr. Galligher knows to reach me as needed.     Subjective:   Mr. Thiara presented as alert and oriented.  He was cooperative and engaged, and did not present with depressed affect during today's session.    Today was the first time that Mr. Bushee and I had met since it was discovered that he had CNS metastasis, incurred a brain bleed, s/p surgical resection and subsequent hospitalization.  Mr. Schutt is currently staying at [facility]; he reported that while he does not like being there it is tolerable.  He stated that he is agreeable with the plan for him to remain there as long as needed, and he hopes that he will be able to return home by the end of May.  Mr. Posen reported that his goals are to return home, get his dogs back, and ultimately returned to work.  He appreciates that there are obstacles in the way for him to achieve his goals (eg, renovating his home so that it is safe for his return), but sees them as attainable.    Mr. Hassebrock and I discussed and processed his significant changes in his health status and psychosocial functioning in the months since our last session.  He is facing stressors on multiple fronts at this time, which we discussed and identified during today's session.  It appears that there remains some tensions with at least one of his siblings who remains his power of attorney.  Mr. Coleman acknowledged experiencing anxiety and depressive symptoms; he denied feeling completely hopeless or having thoughts of self-harm.  Again, he noted that he has established goals and hopes for returning home and being reunited with his dogs.  Mr. Goertz described making an effort to focus on matters that he can control, while trying to tolerated except ones that he has less control over.    I provided Mr. Vanname with support and understanding.  We discussed and processed his thoughts, feelings and experiences in a therapeutic environment.  We discussed and problem-solved cognitive and behavioral approaches he can use to alleviate/tolerate distress, address stressors, and live in accordance to his values to the extent possible.  Mr. Phong was engaged and receptive.  I recommended that we reengage in regularly scheduled psychotherapy sessions; he was interested and receptive, and we agreed to meet in two weeks.      Mental Status Exam:  Appearance:     Appears stated age   Motor:    No abnormal movements   Speech/Language:     Normal rate, volume, tone, fluency   Mood:    I get down some   Affect:    Cooperative; engaged; did not present with depressed affect during today's session   Thought process:    Logical, linear, clear, coherent, goal-directed   Thought content:      Denies SI/HI or thought of self-harm   Perceptual disturbances:      Behavior not concerning for response to internal stimuli   Orientation:    Oriented to person, place, time, and general circumstances   Attention:    Able to fully attend without fluctuations in consciousness   Concentration:    Able to fully concentrate and attend   Memory:    Immediate, short-term, long-term, and recall grossly intact    Fund of knowledge:     Consistent with level of education and development   Insight:  Intact   Judgment:     Intact   Impulse Control:    Intact      Diagnosis: Recurrent major depressive disorder, moderate;  Anxiety associated with cancer diagnosis     Conni Deis. Annasophia Crocker PhD  May 15, 2023

## 2023-05-16 DIAGNOSIS — C439 Malignant melanoma of skin, unspecified: Principal | ICD-10-CM

## 2023-05-16 MED ORDER — SULFASALAZINE 500 MG TABLET
ORAL_TABLET | Freq: Two times a day (BID) | ORAL | 1 refills | 90.00000 days | Status: CP
Start: 2023-05-16 — End: ?

## 2023-05-16 MED FILL — BRAFTOVI 75 MG CAPSULE: ORAL | 30 days supply | Qty: 180 | Fill #5

## 2023-05-16 NOTE — Unmapped (Signed)
 Patient is requesting the following refill  Requested Prescriptions     Pending Prescriptions Disp Refills    sulfaSALAzine (AZULFIDINE) 500 mg tablet [Pharmacy Med Name: SULFASALAZINE 500 MG TABLET] 360 tablet 3     Sig: TAKE 2 TABLETS (1,000 MG TOTAL) BY MOUTH TWO (2) TIMES A DAY.       Recent Visits  Date Type Provider Dept   12/30/22 Office Visit Mangel, Benison Pap, DO [facility] [facility] [address] [facility]   11/14/22 Office Visit Sheria Dills, Robyne Christen, MD [facility]   08/30/22 Office Visit Mangel, Benison Pap, DO [place] [facility] [address] [facility]   05/30/22 Office Visit Mangel, Benison Pap, DO [place] [facility] [address] At [facility]   Showing recent visits within past 365 days and meeting all other requirements  Future Appointments  Date Type Provider Dept   06/03/23 Appointment Mangel, Benison Pap, DO [place] [facility] [address] At [facility]   Showing future appointments within next 365 days and meeting all other requirements           Encounter for refill request:    Colonoscopy:   Appointments which have been scheduled for you      May 23, 2023 1:00 PM  (Arrive by 12:30 PM)  LAB ONLY [place] with ADULT ONC LAB  [facility] ADULT ONCOLOGY LAB DRAW STATION [facility] REGION) [address]  [place] [address]  [phone]        May 23, 2023 2:00 PM  (Arrive by 1:30 PM)  RETURN ACTIVE [place] with Justice Olp, MD  [facility] SURGERY ONCOLOGY [facility] REGION) [address]  [place] [address]  [phone]        May 23, 2023 3:00 PM  (Arrive by 2:30 PM)  INFUSION W/ PRIOR APPT with [facility] CHAIR 34  [place] ONCOLOGY INFUSION [facility] REGION) [address]  [place] [address]  [phone]        May 29, 2023 12:00 PM  (Arrive by 11:30 AM)  RETURN VIDEO MYCHART with Florencio Hunting, PhD  [facility] CCSP 2ND FLR CANCER HOSP [facility] REGION) [address]  [place] [address]  [phone]   Please sign into My [place] Chart at least 15 minutes before your appointment to complete the eCheck-In process. You must complete eCheck-In before you can start your video visit. We also recommend testing your audio and video connection to troubleshoot any issues before your visit begins. Click ???Join Video Visit??? to complete these checks. Once you have completed eCheck-In and tested your audio and video, click ???Join Call??? to connect to your visit.     For your video visit, you will need a computer with a working camera, speaker and microphone, a smartphone, or a tablet with internet access.    My [place] Chart enables you to manage your health, send non-urgent messages to your provider, view your test results, schedule and manage appointments, and request prescription refills securely and conveniently from your computer or mobile device.    You can go to [email or web address] to sign in to your My [place] Chart account with your username and password. If you have forgotten your username or password, please choose the ???Forgot Username???? and/or ???Forgot Password???? links to gain access. You also can access your My [place] Chart account with the free MyChart mobile app for Android or  iPhone.    If you need assistance accessing your My [place] Chart account or for assistance in reaching your provider's office to reschedule or cancel your appointment, please call [facility] [phone].         Jun 03, 2023 10:20 AM  (Arrive by 10:05 AM)  Physical with Benison Pap Mangel, DO  [facility] PRIMARY CARE [address] AT [facility] REGION) [address] [place] [address]  [phone]   Arrive 15 minutes prior to appointment.         Jul 02, 2023 2:00 PM  (Arrive by 1:30 PM)  MRI Brain With and Without Contrast with [facility] MRI RM 5  IMG MRI [facility]) [address]  [place] [address]  [phone]   On appt date:  Bring recent lab work  Bring documentation of any metal object implants  Take meds as usual  Check w/physician if diabetic  You will be asked to change into a gown for your safety    On appt date do not:  No restrictions on food/drink  Wear metallic items including jewelry (we are not responsible for lost items)    Let us  know if pt:  Claustrophobic  Metal object implant  Pregnant  Prescribed a sedative  On dialysis  Allergic to MRI dye/contrast  Kidney Failure    (Title:MRIWCNTRST)         Jul 02, 2023 4:00 PM  (Arrive by 3:30 PM)  FOLLOW UP 30 with Arden Kotyk, MD  [facility] [place] RADIATION ONCOLOGY [place] [facility] REGION) [address]  [place] [address]  [phone]        Jul 24, 2023 1:30 PM  (Arrive by 1:15 PM)  RETURN ENT with Adam [place] Kimple, MD  [place] [facility]ARYNGOLOGY [place] [address] [place] [facility] REGION) [address]  [place] [address]  [phone]             Lab Results   Component Value Date    WBC 5.3 05/06/2023    RBC 4.37 05/06/2023    HGB 13.0 05/06/2023    HCT 38.4 (L) 05/06/2023    PLT 202 05/06/2023    ALT 108 (H) 05/06/2023    AST 58 (H) 05/06/2023    ALKPHOS 127 (H) 05/06/2023    CRP 14.0 (H) 09/30/2019    CREATININE 1.50 (H) 05/06/2023       refills authorized x  6 months

## 2023-05-16 NOTE — Unmapped (Signed)
 [facility] Refill Coordination Note    Specialty Medication(s) to be Shipped:   Hematology/Oncology: Braftovi    Other medication(s) to be shipped: No additional medications requested for fill at this time     Kenneth Fitzgerald, DOB: 04-23-1975  Phone: [phone] (home)       All above HIPAA information was verified with patient's caregiver, sister      Was a translator used for this call? No    Completed refill call assessment today to schedule patient's medication shipment from the [facility]  [phone]).  All relevant notes have been reviewed.     Specialty medication(s) and dose(s) confirmed:  [name], patient taking 2 tablets twice a day and provider is aware.    Changes to medications: [name] reports no changes at this time.  Changes to insurance: No  New side effects reported not previously addressed with a pharmacist or physician: None reported  Questions for the pharmacist: No    Confirmed patient received a Conservation officer, historic buildings and a Surveyor, mining with first shipment. The patient will receive a drug information handout for each medication shipped and additional FDA Medication Guides as required.       DISEASE/MEDICATION-SPECIFIC INFORMATION        N/A    SPECIALTY MEDICATION ADHERENCE     Medication Adherence    Patient reported X missed doses in the last month: 0  Specialty Medication: [name] 15 mg tablet (binimetinib)  Patient is on additional specialty medications: Yes  Additional Specialty Medications: BRAFTOVI 75 mg capsule (encorafenib)  Patient Reported Additional Medication X Missed Doses in the Last Month: 0  Patient is on more than two specialty medications: No              Were doses missed due to medication being on hold? No           encorafenib: BRAFTOVI 75 mg capsule  2 of medicine on hand       REFERRAL TO PHARMACIST     Referral to the pharmacist: Not needed      SHIPPING     Shipping address confirmed in Epic. Cost and Payment: Patient has a $0 copay, payment information is not required.    Delivery Scheduled: Yes, Expected medication delivery date: 5.5.2025.     Medication will be delivered via UPS to the temporary address in Epic WAM.    [name]   [facility]  Specialty Technician

## 2023-05-19 DIAGNOSIS — C439 Malignant melanoma of skin, unspecified: Principal | ICD-10-CM

## 2023-05-23 ENCOUNTER — Other Ambulatory Visit: Admit: 2023-05-23 | Discharge: 2023-05-24 | Payer: BLUE CROSS/BLUE SHIELD

## 2023-05-23 ENCOUNTER — Ambulatory Visit: Admit: 2023-05-23 | Discharge: 2023-05-24 | Payer: BLUE CROSS/BLUE SHIELD

## 2023-05-23 ENCOUNTER — Inpatient Hospital Stay: Admit: 2023-05-23 | Discharge: 2023-05-24 | Payer: BLUE CROSS/BLUE SHIELD

## 2023-05-23 ENCOUNTER — Ambulatory Visit
Admit: 2023-05-23 | Discharge: 2023-05-24 | Payer: BLUE CROSS/BLUE SHIELD | Attending: Hematology & Oncology | Primary: Hematology & Oncology

## 2023-05-23 DIAGNOSIS — C7931 Secondary malignant neoplasm of brain: Principal | ICD-10-CM

## 2023-05-23 DIAGNOSIS — C439 Malignant melanoma of skin, unspecified: Principal | ICD-10-CM

## 2023-05-23 DIAGNOSIS — C434 Malignant melanoma of scalp and neck: Principal | ICD-10-CM

## 2023-05-23 LAB — COMPREHENSIVE METABOLIC PANEL
ALBUMIN: 3.7 g/dL (ref 3.4–5.0)
ALKALINE PHOSPHATASE: 118 U/L — ABNORMAL HIGH (ref 46–116)
ALT (SGPT): 37 U/L (ref 10–49)
ANION GAP: 9 mmol/L (ref 5–14)
AST (SGOT): 22 U/L (ref <=34)
BILIRUBIN TOTAL: 0.4 mg/dL (ref 0.3–1.2)
BLOOD UREA NITROGEN: 14 mg/dL (ref 9–23)
BUN / CREAT RATIO: 15
CALCIUM: 7.8 mg/dL — ABNORMAL LOW (ref 8.7–10.4)
CHLORIDE: 104 mmol/L (ref 98–107)
CO2: 26 mmol/L (ref 20.0–31.0)
CREATININE: 0.94 mg/dL (ref 0.73–1.18)
EGFR CKD-EPI (2021) MALE: >90 mL/min/1.73m2 (ref >=60)
GLUCOSE RANDOM: 94 mg/dL (ref 70–179)
POTASSIUM: 3.8 mmol/L (ref 3.4–4.8)
PROTEIN TOTAL: 6.9 g/dL (ref 5.7–8.2)
SODIUM: 139 mmol/L (ref 135–145)

## 2023-05-23 LAB — CBC W/ AUTO DIFF
BASOPHILS ABSOLUTE COUNT: 0.1 10*9/L (ref 0.0–0.1)
BASOPHILS RELATIVE PERCENT: 1.1 %
EOSINOPHILS ABSOLUTE COUNT: 0.4 10*9/L (ref 0.0–0.5)
EOSINOPHILS RELATIVE PERCENT: 4.4 %
HEMATOCRIT: 34.9 % — ABNORMAL LOW (ref 39.0–48.0)
HEMOGLOBIN: 12.1 g/dL — ABNORMAL LOW (ref 12.9–16.5)
LYMPHOCYTES ABSOLUTE COUNT: 1.3 10*9/L (ref 1.1–3.6)
LYMPHOCYTES RELATIVE PERCENT: 14.9 %
MEAN CORPUSCULAR HEMOGLOBIN CONC: 34.6 g/dL (ref 32.0–36.0)
MEAN CORPUSCULAR HEMOGLOBIN: 29.8 pg (ref 25.9–32.4)
MEAN CORPUSCULAR VOLUME: 86.2 fL (ref 77.6–95.7)
MEAN PLATELET VOLUME: 7.3 fL (ref 6.8–10.7)
MONOCYTES ABSOLUTE COUNT: 0.6 10*9/L (ref 0.3–0.8)
MONOCYTES RELATIVE PERCENT: 6.9 %
NEUTROPHILS ABSOLUTE COUNT: 6.3 10*9/L (ref 1.8–7.8)
NEUTROPHILS RELATIVE PERCENT: 72.7 %
PLATELET COUNT: 240 10*9/L (ref 150–450)
RED BLOOD CELL COUNT: 4.05 10*12/L — ABNORMAL LOW (ref 4.26–5.60)
RED CELL DISTRIBUTION WIDTH: 15.4 % — ABNORMAL HIGH (ref 12.2–15.2)
WBC ADJUSTED: 8.6 10*9/L (ref 3.6–11.2)

## 2023-05-23 MED ORDER — AMLODIPINE 5 MG TABLET
ORAL_TABLET | Freq: Every day | ORAL | 3 refills | 90.00000 days | Status: CP
Start: 2023-05-23 — End: 2024-06-26

## 2023-05-23 MED ADMIN — bevacizumab-bvzr (ZIRABEV) 2,019 mg in sodium chloride (NS) 0.9 % 101 mL IVPB: 15 mg/kg | INTRAVENOUS | @ 21:00:00 | Stop: 2023-05-23

## 2023-05-23 NOTE — Unmapped (Signed)
 Patient arrived to chair 29.  No complaints noted.  Access of PIV intact with + blood return.   Patient completed and tolerated treatment.  AVS declined, PIV removed with + BR prior, and patient discharged to home.

## 2023-05-23 NOTE — Unmapped (Signed)
 RED ZONE Means: RED ZONE: Take action now!     You need to be seen right away  Symptoms are at a severe level of discomfort    Call 911 or go to your nearest  Hospital for help     - Bleeding that will not stop    - Hard to breathe    - New seizure - Chest pain  - Fall or passing out  -Thoughts of hurting    yourself or others      Call 911 if you are going into the RED ZONE                  YELLOW ZONE Means:     Please call with any new or worsening symptom(s), even if not on this list.  Call [phone]  After hours, weekends, and holidays - you will reach a long recording with specific instructions, If not in an emergency such as above, please listen closely all the way to the end and choose the option that relates to your need.   You can be seen by a provider the same day through our Same Day Acute Care for Patients with Cancer program.      YELLOW ZONE: Take action today     Symptoms are new or worsening  You are not within your goal range for:    - Pain    - Shortness of breath    - Bleeding (nose, urine, stool, wound)    - Feeling sick to your stomach and throwing up    - Mouth sores/pain in your mouth or throat    - Hard stool or very loose stools (increase in       ostomy output)    - No urine for 12 hours    - Feeding tube or other catheter/tube issue    - Redness or pain at previous IV or port/catheter site    - Depressed or anxiety   - Swelling (leg, arm, abdomen,     face, neck)  - Skin rash or skin changes  - Wound issues (redness, drainage,    re-opened)  - Confusion  - Vision changes  - Fever >100.4 F or chills  - Worsening cough with mucus that is    green, yellow, or bloody  - Pain or burning when going to the    bathroom  - Home Infusion Pump Issue- call    [phone]         Call your healthcare provider if you are going into the YELLOW ZONE     GREEN ZONE Means:  Your symptoms are under controls  Continue to take your medicine as ordered  Keep all visits to the provider GREEN ZONE: You are in control  No increase or worsening symptoms  Able to take your medicine  Able to drink and eat    - DO NOT use MyChart messages to report red or yellow symptoms. Allow up to 3    business days for a reply.  -MyChart is for non-urgent medication refills, scheduling requests, or other general questions.         [id number] Rev. 07/13/2021  Approved by Oncology Patient Education Committee     Lab on 05/23/2023   Component Date Value Ref Range Status    Sodium 05/23/2023 139  135 - 145 mmol/L Final    Potassium 05/23/2023 3.8  3.4 - 4.8 mmol/L Final    Chloride 05/23/2023 104  98 - 107 mmol/L Final  CO2 05/23/2023 26.0  20.0 - 31.0 mmol/L Final    Anion Gap 05/23/2023 9  5 - 14 mmol/L Final    BUN 05/23/2023 14  9 - 23 mg/dL Final    Creatinine 96/04/5407 0.94  0.73 - 1.18 mg/dL Final    BUN/Creatinine Ratio 05/23/2023 15   Final    eGFR CKD-EPI (2021) Male 05/23/2023 >90  >=60 mL/min/1.14m2 Final    eGFR calculated with CKD-EPI 2021 equation in accordance with [employer] and [employer] of Nephrology Task Force recommendations.    Glucose 05/23/2023 94  70 - 179 mg/dL Final    Calcium 81/19/1478 7.8 (L)  8.7 - 10.4 mg/dL Final    Albumin 29/56/2130 3.7  3.4 - 5.0 g/dL Final    Total Protein 05/23/2023 6.9  5.7 - 8.2 g/dL Final    Total Bilirubin 05/23/2023 0.4  0.3 - 1.2 mg/dL Final    AST 86/57/8469 22  <=34 U/L Final    ALT 05/23/2023 37  10 - 49 U/L Final    Alkaline Phosphatase 05/23/2023 118 (H)  46 - 116 U/L Final    WBC 05/23/2023 8.6  3.6 - 11.2 10*9/L Final    RBC 05/23/2023 4.05 (L)  4.26 - 5.60 10*12/L Final    HGB 05/23/2023 12.1 (L)  12.9 - 16.5 g/dL Final    HCT 62/95/2841 34.9 (L)  39.0 - 48.0 % Final    MCV 05/23/2023 86.2  77.6 - 95.7 fL Final    MCH 05/23/2023 29.8  25.9 - 32.4 pg Final    MCHC 05/23/2023 34.6  32.0 - 36.0 g/dL Final    RDW 32/44/0102 15.4 (H)  12.2 - 15.2 % Final    MPV 05/23/2023 7.3  6.8 - 10.7 fL Final    Platelet 05/23/2023 240  150 - 450 10*9/L Final Neutrophils % 05/23/2023 72.7  % Final    Lymphocytes % 05/23/2023 14.9  % Final    Monocytes % 05/23/2023 6.9  % Final    Eosinophils % 05/23/2023 4.4  % Final    Basophils % 05/23/2023 1.1  % Final    Absolute Neutrophils 05/23/2023 6.3  1.8 - 7.8 10*9/L Final    Absolute Lymphocytes 05/23/2023 1.3  1.1 - 3.6 10*9/L Final    Absolute Monocytes 05/23/2023 0.6  0.3 - 0.8 10*9/L Final    Absolute Eosinophils 05/23/2023 0.4  0.0 - 0.5 10*9/L Final    Absolute Basophils 05/23/2023 0.1  0.0 - 0.1 10*9/L Final    Spec Gravity/POC 05/23/2023 1.025  1.003 - 1.030 Final    PH/POC 05/23/2023 6.0  5.0 - 9.0 Final    Leuk Esterase/POC 05/23/2023 Negative  Negative Final    Nitrite/POC 05/23/2023 Negative  Negative Final    Protein/POC 05/23/2023 1+ (A)  Negative Final    UA Glucose/POC 05/23/2023 Negative  Negative Final    Ketones, POC 05/23/2023 Trace (A)  Negative Final    Bilirubin/POC 05/23/2023 Negative  Negative Final    Blood/POC 05/23/2023 Negative  Negative Final    Urobilinogen/POC 05/23/2023 0.2  0.2 - 1.0 mg/dL Final

## 2023-05-23 NOTE — Unmapped (Signed)
 1725: Pt tolerated treatment/infusion W/O difficulty. PIV removed per protocol.   Pt left infusion center ambulatory. NAD, no questions nor complaints voiced at D/C. Pt aware of follow up.

## 2023-05-24 DIAGNOSIS — C439 Malignant melanoma of skin, unspecified: Principal | ICD-10-CM

## 2023-05-29 ENCOUNTER — Encounter: Admit: 2023-05-29 | Discharge: 2023-05-30 | Payer: BLUE CROSS/BLUE SHIELD | Attending: Clinical | Primary: Clinical

## 2023-05-29 NOTE — Unmapped (Signed)
 [facility]  Psychiatry  Psychotherapy Note - Telehealth via Video     Service Date: May 30, 2023  Service: 53 minutes of psychotherapy via video session  Time with Patient: 53 minutes     Encounter Description: This encounter was conducted from provider's office via EPIC video session due to COVID-19 pandemic. Kenneth Fitzgerald was located in the facility where he is currently staying. Session was conducted via EPIC.  Rationale for video session is need to social distance. See Plan for telemedicine consent/disclaimer.      Encounter Description/Consent:   Ellin Gutter visit was completed through telehealth encounter (video).      This patient encounter is appropriate and reasonable under the circumstances. The patient has been advised of the potential risks and limitations of this mode of treatment (including, but not limited to, the absence of in-person examination) and has agreed to be treated in a remote fashion in spite of them. Any and all of the patient's/patient's family's questions on this issue have been answered.      The patient was physically located in [place]  in which I am permitted to provide care. The patient understood that he may incur co-pays and cost sharing, and agreed to the telemedicine visit. The visit was reasonable and appropriate under the circumstances given the patient's presentation at the time.      Time Spent: 45 minutes     Assessment:  Mr. Minahan is a 48yo male with a history of metastatic melanoma and thyroid cancer who I have see for psychotherapy to address depression and adjustment to multiple stressors.  I met with Mr. Jani today via telehealth. He continues to feel distress in form of depressed mood and anxiety. At this time, Mr. Every primary focus is returning to his home.  This is complicated, but he believes he is ready and will discuss with Dr Patria Bookbinder tomorrow.  The condition of his home is a concern; however, some renovations have been made and more clearing out is scheduled.  Mr. Boffa is cognizant of his challenging prognosis and noted that returning home and reuniting with his dogs are most important to him.  I offered support, psychoeducation, therapeutic setting to process his experiences, and recommendations to address/tolerate distress.  We will meet next in ~ two weeks.  I will also reconnect him with Vona Grumet, NP.     Risk Assessment:  A suicide and violence risk assessment was performed as part of this evaluation. There patient is deemed to be at chronic elevated risk for self-harm/suicide given the following factors: current diagnosis of depression, suicidal ideation or threats without a plan and past diagnosis of depression. There patient is deemed to be at chronic elevated risk for violence given the following factors: male gender. These risk factors are mitigated by the following factors: lack of active SI/HI. There is no acute risk for suicide or violence at this time. The patient was educated about relevant modifiable risk factors including following recommendations for treatment of psychiatric illness and abstaining from substance abuse. While future psychiatric events cannot be accurately predicted, the patient does not currently require acute inpatient psychiatric care and does not currently meet [place]  involuntary commitment criteria.              Plan:  1. Will to see Mr. Weese for psychotherapy; scheduled to meet next on 6/3.  2. Mr. Amor previously has seen Vona Grumet, NP.  I recommended today that he reconnect with her.  Mr. Coate  was receptive and I will reach out to NP [facility].  3. Mr. Evrard has information on available [facility] resources and he has worked with Diane Bensley, [name].  I have previously reached out to Emelie Hanger to explore financial resources.  4. Mr. Lenker knows to reach me as needed.     Subjective:   Mr. Riolo presented as alert and oriented.  He was cooperative and engaged as usual.  Mr. Feltus presented as coherent, clear, and logical throughout the session.     Mr. Sobolewski continues to experience poor mood and anxiety that are associated with multiple stressors, primarily at this point his current and future living situation.  He continues to reside at the [facility] facility.  While he again noted that residing there is tolerable, he remains very eager to return home.  This is his top priority, and Mr. Settlemire views being able to return home (and then be with his dogs) as the most important thing for his mental health at this time.  Mr. Slager feels that he is physically and mentally ready to return home at this time, and has an appointment with Dr. Patria Bookbinder tomorrow and which he plans to advocate for the viability of returning home.  Mr. Ayoub acknowledges that the state of his home is not in good condition, although there have been some renovations made in recent weeks and he believes that his home will be ready within 2-3 weeks time.  Mr. Gerry continues to cite tension in his relationship with his sister who does not believe that he is ready to return home.  He understands the recommendation from the team that he should not be driving at this time.    Mr. [name] continues to take duloxetine.  I asked whether he found this beneficial, he noted it's not hurting.  Given the considerable stressors that he is facing at this time and depressed mood, I recommended that he reconnect with Vona Grumet, NP, to discuss medication management.  He was receptive and I will reach out to NP [facility].     I provided [name] with support and understanding.  We discussed and processed his thoughts, feelings and experiences in some depth and in a therapeutic environment.  We discussed and problem-solved cognitive and behavioral approaches he can use to alleviate/tolerate distress, address stressors, and live in accordance to his values to the extent possible.  We discussed him focusing on matters that he can control, while trying to tolerated that he has less control over.  We discussed the role played conversation he wants to have with Dr. Patria Bookbinder tomorrow.  Mr. Ingalsbe was engaged and receptive.  We will continue to meet for psychotherapy, and scheduled to meet next on 6/3.      Mental Status Exam:  Appearance:     Appears stated age   Motor:    No abnormal movements   Speech/Language:     Normal rate, volume, tone, fluency   Mood:    Its not great right now   Affect:    Cooperative; engaged; some depressed affect   Thought process:    Logical, linear, clear, coherent, goal-directed   Thought content:      Denies SI/HI or thought of self-harm   Perceptual disturbances:      Behavior not concerning for response to internal stimuli   Orientation:    Oriented to person, place, time, and general circumstances   Attention:    Able to fully attend without fluctuations in  consciousness   Concentration:    Able to fully concentrate and attend   Memory:    Immediate, short-term, long-term, and recall grossly intact    Fund of knowledge:     Consistent with level of education and development   Insight:      Intact   Judgment:     Intact   Impulse Control:    Intact      Diagnosis: Recurrent major depressive disorder, moderate;  Anxiety associated with cancer diagnosis     Conni Deis. Leatrice Parilla PhD  Jun 02, 2023

## 2023-05-30 ENCOUNTER — Ambulatory Visit
Admit: 2023-05-30 | Discharge: 2023-05-31 | Payer: BLUE CROSS/BLUE SHIELD | Attending: Hematology & Oncology | Primary: Hematology & Oncology

## 2023-05-30 DIAGNOSIS — C439 Malignant melanoma of skin, unspecified: Principal | ICD-10-CM

## 2023-05-30 MED ORDER — BUDESONIDE DR - ER 3 MG CAPSULE,DELAYED,EXTENDED RELEASE
ORAL_CAPSULE | Freq: Every morning | ORAL | 11 refills | 30.00000 days | Status: CP
Start: 2023-05-30 — End: 2024-05-29

## 2023-05-30 NOTE — Unmapped (Signed)
 Copied from CRM [id number]. Topic: Care Management - Req Clinic Call Back  >> May 30, 2023  4:09 PM Waldo Guitar wrote:  [facility] Other Call ONC Phone Room Smart Lists: General -     [name] contacted the Communication Center regarding the following:    - Kenneth Fitzgerald is still waiting for his telephone appt w/Dr Moschos    Please contact Kenneth Fitzgerald at [phone] with an update    Thanks in advance,    Steven Elam  [facility]   [phone]    [facility]

## 2023-05-30 NOTE — Unmapped (Signed)
 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 Minimal extracranial disease. Nevertheless, preserved functional status. Issues to be discussed:   -We appreciate the lack of neurologic symptoms, and even the improvement, with lowering doses of decadron while on avastin. I think we are ready to think about:  A. Lowering the dose of decadron to 1mg  every other day  B. Making preparations for transitioning to an immune checkpoint inhibitor based-treatment while avastin is still being used. Given the lack of data for ipi-nivo avastin, we will proceed with the recently published regimen of pembrolizumab plus avastin Sibyl Drafts JCO 2025).     We discussed the following regarding the pembro-avastin regimen:  A. The switch has to do with the hope for more durable response of his cancer  B. There is no guarantee that a switch from a targeted therapy to immunotherapy will be more effective; a sign of failure could well be intracranial progression and hemorrhage  C. 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   D. Need to perform restaging scans before the switch to confirm cancer status.    If he tolerates the pembrolizumab-avastin regiment, we may consider ipi-nivo in the future. But there is no guarantee.    He asked me again about his potential to move to his home. Although I did not object to that, I told him that he will be in transitioning regimen that may not be effective. So there is a risk.    I will see him in 10 days in person. He will hopefully receive pembrolizumab-avastin in 2 weeks.    I spent 25 minutes discussing symptoms, and future oncology plans.    Diagnosis: melanoma  Other: none  Stage: IVd  Current Therapy: encorafenib-binimetinib plus avastin 15 mg/kg     REFERRING PROVIDER  Candance Certain, MD    ONCOLOGY HISTORY  -07/27/10; scalp melanoma, superficial spreading type, Breslow thickness 1.3 mm, 1 mitoses/mm2, no TILs ([id number]).  -09/11/10; wide excision and sentinel lymph node mapping c/w residual melanoma; 3/4 R posterior scalp lymph nodes were positive for melanoma; of note, melanoma cells were present in 2-5% of lymph node parenchyma ([id number], [facility])   -11/06/10; re-do wide excision and R level II cervical lymph node dissection c/w 26 negative lymph nodes ([id number]).  -01/2012; completed adjuvant high dose interferon.  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 His melanoma was stained positive for [id number] ([id number]).   -06/2013; completed radiation to the R neck.   -01/09/18; surveillance PET/CT scan showed positive abdominal/pelvic lymph nodes. C/w melanoma ([id number]).  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  -05/2018- started enco-bini  -01/28/19; interim growth of oligometastatic disease in the brain  -01/2020; completed whole brain irradiation  -05/07/22; interval increase in size of two brain lesions; s/p cyberknife to two L frontal lesions and R cingulate gyrus to 20 Gy and one fraction   -01/30/23; new R frontal cortex hemorrhagic lesion that was asymptomatic at the time  -02/12/23; worsening headaches  -04/04/23; resection of the dominant R frontal lobe mass c/w  metastatic melanoma ([id number]).     SUBJECTIVE  Kenneth Fitzgerald is a 48 y.o. male is phoned in to discuss his symptoms while being wean off corticosteroids for his metastatic melanoma to the brain. I confirmed that at the time of the phone call he was located at her house in the state of [place]. I obtained her verbal consent to review her medical records. Since last visit he denies any worsening neurologic symptoms with a lower dose of decadron, 1mg  qd; he has been only dizzy one time. He has no walking issue, no ha. His thought process and organization is good; his sister admits that he himself is going a lot of things that his sister and brother used to do for him; he brings up an example of how her retrieved data from a lost debit card. His appetite is great.   He states that his blood pressure has been better controlled once he added norvasc 5 mg every day; the diastolic blood pressures are no longer more than 100 (140/94, 145/96). He is eager to move back to his own apartment and reconnect with his dog. Other systems were reviewed and were negative.    MEDICATIONS  Tylenol 650mg  q6hrs prn, ascorbic acie 500mg  bid, decadron 1 mg daily, Braftovi 450 mg daily, [name] 45 mg daily. Calcitriol 0.25 mg every day, tums 400 mg bid, cymbalta 30 mg bid, zetia 10 mg every day, allegra 180 mg prn, fluticasone 1 spray bid, folic acid 1,000 mcg every day, insulin novolin 4 units subcutaneously every day, synthroid 125 mcg every day, lisinopril 10mg  every day, multivitamins every day, zofran 8 mg q8hrs prn, glycolax 17 gr bid prn, senna 8.6 mg bid, sulfasalazine 1,000 mg bid, zinc 220 mg every day, norvasc 5 mg every day.    OBJECTIVE  This was a phone visit.

## 2023-06-02 DIAGNOSIS — C439 Malignant melanoma of skin, unspecified: Principal | ICD-10-CM

## 2023-06-02 NOTE — Unmapped (Signed)
 Kenneth Fitzgerald is doing well on 1mg  of decadron every day. This opens up an opportunity that never visible 2 months ago. We will take a leap of faith to kill braftovi and mektovi in 10 days from now, when he comes to see me with brain MRI and CT of the chest, abdomen, and pelvis. We will keep the avastin for 5/30 (I will NOT be here) but will ADD pembrolizumab as per the recent J Clin Oncol paper from the [employer]. This is a BIG leap of faith!! 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 There are too many thoughts in my mind but I cannot administer ipi-nivo and avastin (never tested). I have to stick with the pembro-avastin. We may switch to ipi-nivo at a LATER timepoint and depending on how things are going. in true honesty, I never felt he will come thus far. He understands (and I told him to stop asking me about going back to driving; his sister is our ally on this one). So nicole needs to help me put the pembro-avastin order next week.

## 2023-06-02 NOTE — Unmapped (Signed)
 Kenneth Fitzgerald [facility] has been contacted in regards to their refill of Braftovi and [name]. At this time, they have declined refill due to patient is waiting to speak with the provider to determine if he needs to continue taking the medication. Refill assessment call date has been updated per the patient's request.

## 2023-06-02 NOTE — Unmapped (Signed)
 Pembrolizumab added to txt plan

## 2023-06-02 NOTE — Unmapped (Signed)
 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 Minimal extracranial disease. Nevertheless, preserved functional status. Issues to be discussed:   -We appreciate his tolerance to avastin. Notable is the high diastolic blood pressures; we will continue lisinopril 10 mg daily. Will initiate Norvasc 5 mg daily, increase to 10 mg if blood pressure remains elevated. Will monitor blood pressure twice daily.  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 Episodes of forgetfulness and confusion. Emphasized maintaining independence and using strategies to manage memory issues. Encourage keeping a diary to track daily activities and important information. Create a pocket-sized note with personal information and medical condition details for emergencies. Consider a medical alert bracelet for identification and safety.  -We are pleased with his progress. He will receive avastin 15mg /kg and move on the every 3 week schedule. Will reduce decadron to 1mg  every day but will monitor for headaches and memory issues.  -We will have a phone visit with him next week to assess his blood pressure on two drug medications. Then assess whether he has rebound neurologic symptoms with less decadron. If he success to that, we will begin preparations for swapping targeted therapy with immunotherapy (e.g., start budesonide for colitis prophylaxis).    Diagnosis: melanoma  Other: none  Stage: IVd  Current Therapy: encorafenib-binimetinib plus avastin 15 mg/kg        REFERRING PROVIDER  Kenneth Certain, MD    ONCOLOGY HISTORY  -07/27/10; scalp melanoma, superficial spreading type, Breslow thickness 1.3 mm, 1 mitoses/mm2, no TILs ([id number]).  -09/11/10; wide excision and sentinel lymph node mapping c/w residual melanoma; 3/4 R posterior scalp lymph nodes were positive for melanoma; of note, melanoma cells were present in 2-5% of lymph node parenchyma ([id number], [facility])   -11/06/10; re-do wide excision and R level II cervical lymph node dissection c/w 26 negative lymph nodes ([id number]).  -01/2012; completed adjuvant high dose interferon.  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 His melanoma was stained positive for [id number] ([id number]).   -06/2013; completed radiation to the R neck.   -01/09/18; surveillance PET/CT scan showed positive abdominal/pelvic lymph nodes. C/w melanoma ([id number]).  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  -05/2018- started enco-bini  -01/28/19; interim growth of oligometastatic disease in the brain  -01/2020; completed whole brain irradiation  -05/07/22; interval increase in size of two brain lesions; s/p cyberknife to two L frontal lesions and R cingulate gyrus to 20 Gy and one fraction   -01/30/23; new R frontal cortex hemorrhagic lesion that was asymptomatic at the time  -02/12/23; worsening headaches  -04/04/23; resection of the dominant R frontal lobe mass c/w metastatic melanoma ([id number])      SUBJECTIVE  Kenneth Fitzgerald is a 48 y.o. male who comes to receive the next avastin infusion. Since last visit he continues to do well. He has been down to decadron 1 mg bid (from 2 mg bid) and he does not experience any neurologic symptoms; he  has only used Tylenol once in the last week and a half for what he believes was a sinus headache. He has been officially discharged from physical and occupational therapy and he has become an  independent planner; for today's appointment he did not rely on his sister but he rather called and arrange for [employer] transport himself. His appetite is great, denies ha, vision changes, n, v, diarrhea.    His blood pressure has been fairly stable, although there have been several days where his diastolic pressure exceeded 100. He is currently taking lisinopril 10 mg daily for blood pressure management and checks his blood pressure twice a day. He has a history of memory issues, as noted by his sister. He had an incident where he did not recall being on blood pressure medication and was upset about a prescription refill. His sister had to remind him of previous discussions about his high blood pressure and medication regimen.    His walk has been without any dizziness, swaying, propensity to fall. Other systems were reviewed and were negative.    MEDICATIONS  Tylenol 650mg  q6hrs prn, ascorbic acie 500mg  bid, decadron 2 mg twice daily, Braftovi 450 mg daily, [name] 45 mg daily. Calcitriol 0.25 mg every day, tums 400 mg bid, cymbalta 30 mg bid, zetia 10 mg every day, allegra 180 mg prn, fluticasone 1 spray bid, folic acid 1,000 mcg every day, insulin novolin 4 units subcutaneously every day, synthroid 125 mcg every day, lisinopril 10mg  every day, multivitamins every day, zofran 8 mg q8hrs prn, glycolax 17 gr bid prn, senna 8.6 mg bid, sulfasalazine 1,000 mg bid, zinc 220 mg every day.     OBJECTIVE  GENERAL:  Kenneth Fitzgerald [facility] appears alert, oriented, and cooperative.  Weight 135.4kg, Temp 36.3, HR 97, RR 18, BP 141/87, O2SATS 97%, PERFORMANCE STATUS ECOG 0  His does not have any slurred speech, his walk has been normal without any reliance on leaning against the wall.  No sinus tenderness, mouth is benign, no cervical/supraclavicular  lymphadenopathy  Lungs are CTA, HEART is RRR, nl S1/S2, no m, no axillary lymphadenopathy  ABD is soft, nontender, no inguinal lymphadenopathy  EXTR no edema    LABORATORY DATA  All labs (CBC w/diff, CHEM20, TFTs) are normal except H/H 12./34.9, Ca 7.8, AlPhos 118.

## 2023-06-03 DIAGNOSIS — C439 Malignant melanoma of skin, unspecified: Principal | ICD-10-CM

## 2023-06-03 NOTE — Unmapped (Signed)
 Copied from CRM [id number]. Topic: Medication Refill - Medication Refill  >> Jun 03, 2023 10:57 AM Regino Caprio wrote:      Hi,    Patient Kenneth Fitzgerald [facility] called requesting a medication refill for the following:    ?? Medication: :[name] 15mg  and Braftovi 75mg    ?? Pharmacy: [facility]      The expected turnaround time is 3-4 business days     Thank you,  Gaynelle Keeling  [facility]  [phone]

## 2023-06-03 NOTE — Unmapped (Signed)
 Spoke to pt and advised of 2 appts below, he said he was under the impression Dr. Patria Bookbinder wanted the scans done on same day. Explained to pt that the orders were requested as urgent and right now we are fully booked through June. We tried to get appts on same day but due to lack of availability we could not. Advised I will check at end of day to see if there are any openings.    Scheduled  May 21st 9:30AM CT Scans at [facility]  May 22nd 8:45AM MRI of brain at [facility]

## 2023-06-03 NOTE — Unmapped (Signed)
 [facility] Refill Coordination Note    Specialty Medication(s) to be Shipped:   Hematology/Oncology: [name] and [name]    Other medication(s) to be shipped: No additional medications requested for fill at this time     Kenneth Fitzgerald, DOB: [birth year]  Phone: [phone] (home)       All above HIPAA information was verified with patient.     Was a Nurse, learning disability used for this call? No    Completed refill call assessment today to schedule patient's medication shipment from the [facility]  [phone]).  All relevant notes have been reviewed.     Specialty medication(s) and dose(s) confirmed: Regimen is correct and unchanged.   Changes to medications: [place] reports no changes at this time.  Changes to insurance: No  New side effects reported not previously addressed with a pharmacist or physician: None reported  Questions for the pharmacist: No    Confirmed patient received a Conservation officer, historic buildings and a Surveyor, mining with first shipment. The patient will receive a drug information handout for each medication shipped and additional FDA Medication Guides as required.       DISEASE/MEDICATION-SPECIFIC INFORMATION        N/A    SPECIALTY MEDICATION ADHERENCE     Medication Adherence    Patient reported X missed doses in the last month: 0  Specialty Medication: Mextovi 15 mg  Patient is on additional specialty medications: Yes  Additional Specialty Medications: Braftovi 75mg   Patient Reported Additional Medication X Missed Doses in the Last Month: 0  Informant: patient              Were doses missed due to medication being on hold? No    Braftovi 75 mg: +14 days of medicine on hand   [name] 15 mg: 5 days of medicine on hand       REFERRAL TO PHARMACIST     Referral to the pharmacist: Not needed      [name]     Shipping address confirmed in Epic.     Cost and Payment: Patient has a $0 copay, payment information is not required.    Delivery Scheduled: Yes, Expected medication delivery date: [name] on 5/22 and Braftovi on 5/29.     Medication will be delivered via UPS to the temporary address in Epic WAM.    Can Lucci Dennard Fisher, PharmD   [facility]  Specialty Pharmacist

## 2023-06-04 ENCOUNTER — Inpatient Hospital Stay: Admit: 2023-06-04 | Discharge: 2023-06-05 | Payer: BLUE CROSS/BLUE SHIELD

## 2023-06-04 DIAGNOSIS — C439 Malignant melanoma of skin, unspecified: Principal | ICD-10-CM

## 2023-06-04 MED ADMIN — iohexol (OMNIPAQUE) 350 mg iodine/mL solution 100 mL: 100 mL | INTRAVENOUS | @ 16:00:00 | Stop: 2023-06-04

## 2023-06-04 MED FILL — MEKTOVI 15 MG TABLET: ORAL | 30 days supply | Qty: 180 | Fill #5

## 2023-06-04 NOTE — Unmapped (Signed)
 Spoke with Kenneth Fitzgerald, advised I was able to find a ct scan for 5/22nd at 1pm imaging and spine - he said he rather keep the ct scan today and mri tomorrow. No changes were made.

## 2023-06-05 ENCOUNTER — Inpatient Hospital Stay: Admit: 2023-06-05 | Discharge: 2023-06-06 | Payer: BLUE CROSS/BLUE SHIELD

## 2023-06-05 DIAGNOSIS — C439 Malignant melanoma of skin, unspecified: Principal | ICD-10-CM

## 2023-06-05 DIAGNOSIS — C434 Malignant melanoma of scalp and neck: Principal | ICD-10-CM

## 2023-06-05 DIAGNOSIS — C7931 Secondary malignant neoplasm of brain: Principal | ICD-10-CM

## 2023-06-05 MED ADMIN — gadopiclenol (ELUCIREM,VUEWAY) injection 10 mL: 10 mL | INTRAVENOUS | @ 13:00:00 | Stop: 2023-06-05

## 2023-06-08 DIAGNOSIS — C439 Malignant melanoma of skin, unspecified: Principal | ICD-10-CM

## 2023-06-08 DIAGNOSIS — C7931 Secondary malignant neoplasm of brain: Principal | ICD-10-CM

## 2023-06-08 DIAGNOSIS — C434 Malignant melanoma of scalp and neck: Principal | ICD-10-CM

## 2023-06-10 ENCOUNTER — Ambulatory Visit
Admit: 2023-06-10 | Discharge: 2023-06-11 | Payer: BLUE CROSS/BLUE SHIELD | Attending: Hematology & Oncology | Primary: Hematology & Oncology

## 2023-06-10 DIAGNOSIS — C439 Malignant melanoma of skin, unspecified: Principal | ICD-10-CM

## 2023-06-10 DIAGNOSIS — C434 Malignant melanoma of scalp and neck: Principal | ICD-10-CM

## 2023-06-10 DIAGNOSIS — C7931 Secondary malignant neoplasm of brain: Principal | ICD-10-CM

## 2023-06-10 DIAGNOSIS — E782 Mixed hyperlipidemia: Principal | ICD-10-CM

## 2023-06-10 DIAGNOSIS — I10 Essential (primary) hypertension: Principal | ICD-10-CM

## 2023-06-10 MED ORDER — LISINOPRIL 10 MG TABLET
ORAL_TABLET | Freq: Every day | ORAL | 3 refills | 90.00000 days | Status: CP
Start: 2023-06-10 — End: 2024-06-09

## 2023-06-10 MED ORDER — DEXAMETHASONE 1 MG TABLET
ORAL_TABLET | Freq: Four times a day (QID) | ORAL | 1 refills | 0.00000 days
Start: 2023-06-10 — End: ?

## 2023-06-10 MED ORDER — EZETIMIBE 10 MG TABLET
ORAL_TABLET | Freq: Every day | ORAL | 1 refills | 0.00000 days
Start: 2023-06-10 — End: ?

## 2023-06-10 NOTE — Unmapped (Signed)
 Copied from CRM #[id number]. Topic: Care Management - Req Clinic Call Back  >> Jun 10, 2023  3:13 PM [place] wrote:  Requesting callback from Dr. Patria Bookbinder care team, decliend to give further detail

## 2023-06-10 NOTE — Unmapped (Signed)
 Informed pt Rx for Lisinopril was sent to his preferred [facility] pharmacy and shows pharmacy receipt.  Pharmacy does have prescription, Pharmacist is trying to get an  insurance override to fill Rx 1 day early due to pt transportation issue.    Pt wanted to let care team know he was unable to schedule an infusion appt; he was told infusion chairs are all full for Friday, 6/6.     Msg fwd to care team.    Ein Rijo E Baylor Cortez, RN

## 2023-06-10 NOTE — Unmapped (Signed)
 Attending Physician Attendum  I saw and examined patient with Dr. Viviano Ground, clinical fellow in hematology/oncology, and agree with her assessment and plan.He has been feeling better overall, with increased physical activity and no recent episodes of nausea or gagging. No vision changes or headaches, and there is a slight improvement in energy levels. He experiences occasional hand tremors when using his hand. His diastolic blood pressure readings have been lower, around 90, since the increase in Norvasc dosage. If any readings exceed 100, he retakes them, and they fall below 100. He is currently on Norvasc 10 mg and lisinopril. He has been taking budesonide since Jun 03, 2023, after a delay in adding it to his medication regimen.    His living situation is in transition as he is paid up at his group home until Friday, but the room has been sold to someone else. He plans to stay at a hotel if his house is not ready. Although his sister is currently bedridden, he mentioned plans to stay close to her if possible. He manages his medications using a pill box. He has been taking Tums for calcium supplementation for years, and adjustments to his medication schedule are being made to accommodate his needs.    His physical examination is unremarkable. Blood pressure was 122/88.     His scans show intracranial response (!!) and no evidence of extracranial disease.     On assessment, the intracranial response reflects true antitumor response without just shrinkage of his peritumoral edema. This implies that VEGF inhibition is an active target in his case, is a Secretary/administrator and must be continued. What is unknown is whether the transition to a PD1 inhibitor-based treatment will be a durable response for him. I emphasized that this transition comes with risk because we will not continue enco-bini from today onwards because there is no safety about maintaining enco-bini whill on pembro-avastin.     We will proceed with the infusion of pembro-avastin this Friday. Given the home transitioning state (he is moving out from a group home to a hotel situation) along with his drug-transitioning state, we will follow with a phone visit a week after infusion. He will continue on decadron 1mg  every day, continue budesonide. The intermediate-range goal is to administer 2 infusion of pembro-avastin followed by pan-scans.    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 Minimal extracranial disease. Nevertheless, preserved functional status. Issues to be discussed:   -Restaging scans show intracranial response to Avastin  -Will plan to start pembrolizumab + Avastin Sibyl Drafts JCO 2025) for hope of more durable response to his cancer  -Stop enco-bini  -Given prior ICI colitis, will continue budesonide 6mg  as well as continue decadron 1mg   -Discussed setting up pill box for 2 weeks at a time to minimize errors.  -Phone visit with Dr Patria Bookbinder 1 week after C1 pembro-Avastin to check on how he is doing  -C1 pembro-Avastin 06/13/23, C2 07/04/23  -Repeat imaging in 6 weeks to see if his melanoma starts growing once off enco-bini    Patient seen and discussed with Dr Patria Bookbinder    Viviano Ground, MD PhD  Hematology/Oncology, PGY-6     Diagnosis: melanoma  Other: none  Stage: IVd  Current Therapy: plan to start pembro plus avastin 15 mg/kg on 06/13/23     REFERRING PROVIDER  Candance Certain, MD    ONCOLOGY HISTORY  -07/27/10; scalp melanoma, superficial  spreading type, Breslow thickness 1.3 mm, 1 mitoses/mm2, no TILs ([id number]).  -09/11/10; wide excision and sentinel lymph node mapping c/w residual melanoma; 3/4 R posterior scalp lymph nodes were positive for melanoma; of note, melanoma cells were present in 2-5% of lymph node parenchyma ([id number], [facility])   -11/06/10; re-do wide excision and R level II cervical lymph node dissection c/w 26 negative lymph nodes ([id number]).  -01/2012; completed adjuvant high dose interferon.  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 His melanoma was stained positive for [id number] ([id number]).   -06/2013; completed radiation to the R neck.   -01/09/18; surveillance PET/CT scan showed positive abdominal/pelvic lymph nodes. C/w melanoma ([id number]).  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  -05/2018- started enco-bini  -01/28/19; interim growth of oligometastatic disease in the brain  -01/2020; completed whole brain irradiation  -05/07/22; interval increase in size of two brain lesions; s/p cyberknife to two L frontal lesions and R cingulate gyrus to 20 Gy and one fraction   -01/30/23; new R frontal cortex hemorrhagic lesion that was asymptomatic at the time  -02/12/23; worsening headaches  -04/04/23; resection of the dominant R frontal lobe mass c/w metastatic melanoma ([id number])   -04/29/2023: C1D1 bevacizumab (+enco/bini)     SUBJECTIVE  Kenneth Fitzgerald is a 48 y.o. male who comes for follow-up.    He reports he is planning to leave his group home by this Friday and stay at a hotel if his house is not yet ready. He is not yet sure about transportation back to [facility] for infusion appointment but is working on it. His sister injured her back and is unable to help with transportation.    He started the budesonide on 5/20. Has remained on decadron 1mg  daily. Has been on braftovi/mektovi, last dose this AM. He made his pill box himself, supervised by his sister. Previously his sister or facility would make the pill box.    He feels well, denies any headaches, blurry vision, slurred speech. Still has some mild shaking in left hand. BP has been well controlled on amlodipine 5mg  and lisinopril 10mg  daily - rarely is the diastolic above 100.    Other systems were reviewed and were negative.    MEDICATIONS  Tylenol 650mg  q6hrs prn, ascorbic acie 500mg  bid, budesonide 6mg  daily, decadron 1 mg daily, Braftovi 450 mg daily, [name] 45 mg daily. Calcitriol 0.25 mg every day, tums 400 mg bid, cymbalta 30 mg bid, zetia 10 mg every day, allegra 180 mg prn, fluticasone 1 spray bid, folic acid 1,000 mcg every day, insulin novolin 4 units subcutaneously every day, synthroid 125 mcg every day, amlodipine 5mg  daily, lisinopril 10mg  every day, multivitamins every day, zofran 8 mg q8hrs prn, glycolax 17 gr bid prn, senna 8.6 mg bid, sulfasalazine 1,000 mg bid, zinc 220 mg every day.     OBJECTIVE  GENERAL:  Kenneth Fitzgerald [facility] appears alert, oriented, and cooperative.  Weight 134.5kg, Temp 36.8, HR 98, RR 18, BP 122/88, O2SATS 97%, PERFORMANCE STATUS ECOG 0  He does not have any slurred speech, his walk has been normal without any reliance on leaning against the wall.  No sinus tenderness, mouth is benign, no cervical/supraclavicular  lymphadenopathy  Lungs are  CTA, HEART is RRR, nl S1/S2, no m, no axillary lymphadenopathy  ABD is soft, nontender, no inguinal lymphadenopathy  EXTR no edema    LABORATORY DATA  No labs today    IMAGING   06/04/2023 MRI Brain  Interval decrease in size of numerous intracranial metastases with associated decreased vasogenic edema and mass effect.      Post right frontal craniotomy for metastasis resection without evidence of new or enlarging disease within the resection bed.     06/02/2023 CT Chest  No thoracic metastatic disease.     06/02/2023 CT A/P  -- No evidence of new or recurrent metastatic disease in the abdomen or pelvis.   --Mild splenomegaly, new from prior.

## 2023-06-10 NOTE — Unmapped (Signed)
 error

## 2023-06-11 DIAGNOSIS — C439 Malignant melanoma of skin, unspecified: Principal | ICD-10-CM

## 2023-06-11 DIAGNOSIS — C434 Malignant melanoma of scalp and neck: Principal | ICD-10-CM

## 2023-06-11 DIAGNOSIS — C7931 Secondary malignant neoplasm of brain: Principal | ICD-10-CM

## 2023-06-11 MED ORDER — EZETIMIBE 10 MG TABLET
ORAL_TABLET | Freq: Every day | ORAL | 1 refills | 66.00000 days
Start: 2023-06-11 — End: ?

## 2023-06-11 MED FILL — BRAFTOVI 75 MG CAPSULE: ORAL | 30 days supply | Qty: 180 | Fill #6

## 2023-06-11 NOTE — Unmapped (Signed)
 Please refill if appropriate.     Most recent clinic visit: 06/10/2023  Next clinic visit: 06/20/2023

## 2023-06-12 DIAGNOSIS — C434 Malignant melanoma of scalp and neck: Principal | ICD-10-CM

## 2023-06-12 DIAGNOSIS — C7931 Secondary malignant neoplasm of brain: Principal | ICD-10-CM

## 2023-06-12 DIAGNOSIS — C439 Malignant melanoma of skin, unspecified: Principal | ICD-10-CM

## 2023-06-12 NOTE — Unmapped (Signed)
 Contacted pt about 5/30 apt and confirmed

## 2023-06-13 ENCOUNTER — Inpatient Hospital Stay: Admit: 2023-06-13 | Discharge: 2023-06-14 | Payer: BLUE CROSS/BLUE SHIELD

## 2023-06-13 DIAGNOSIS — C7931 Secondary malignant neoplasm of brain: Principal | ICD-10-CM

## 2023-06-13 DIAGNOSIS — C434 Malignant melanoma of scalp and neck: Principal | ICD-10-CM

## 2023-06-13 LAB — CBC W/ AUTO DIFF
BASOPHILS ABSOLUTE COUNT: 0 10*9/L (ref 0.0–0.1)
BASOPHILS RELATIVE PERCENT: 0.5 %
EOSINOPHILS ABSOLUTE COUNT: 0.2 10*9/L (ref 0.0–0.5)
EOSINOPHILS RELATIVE PERCENT: 3.1 %
HEMATOCRIT: 39.8 % (ref 39.0–48.0)
HEMOGLOBIN: 13.2 g/dL (ref 12.9–16.5)
LYMPHOCYTES ABSOLUTE COUNT: 1.4 10*9/L (ref 1.1–3.6)
LYMPHOCYTES RELATIVE PERCENT: 17.9 %
MEAN CORPUSCULAR HEMOGLOBIN CONC: 33.1 g/dL (ref 32.0–36.0)
MEAN CORPUSCULAR HEMOGLOBIN: 28.2 pg (ref 25.9–32.4)
MEAN CORPUSCULAR VOLUME: 85.4 fL (ref 77.6–95.7)
MEAN PLATELET VOLUME: 7.2 fL (ref 6.8–10.7)
MONOCYTES ABSOLUTE COUNT: 0.6 10*9/L (ref 0.3–0.8)
MONOCYTES RELATIVE PERCENT: 7.5 %
NEUTROPHILS ABSOLUTE COUNT: 5.4 10*9/L (ref 1.8–7.8)
NEUTROPHILS RELATIVE PERCENT: 71 %
PLATELET COUNT: 323 10*9/L (ref 150–450)
RED BLOOD CELL COUNT: 4.66 10*12/L (ref 4.26–5.60)
RED CELL DISTRIBUTION WIDTH: 16 % — ABNORMAL HIGH (ref 12.2–15.2)
WBC ADJUSTED: 7.6 10*9/L (ref 3.6–11.2)

## 2023-06-13 LAB — COMPREHENSIVE METABOLIC PANEL
ALBUMIN: 4.1 g/dL (ref 3.4–5.0)
ALKALINE PHOSPHATASE: 111 U/L (ref 46–116)
ALT (SGPT): 17 U/L (ref 10–49)
ANION GAP: 14 mmol/L (ref 5–14)
AST (SGOT): 22 U/L (ref <=34)
BILIRUBIN TOTAL: 0.3 mg/dL (ref 0.3–1.2)
BLOOD UREA NITROGEN: 16 mg/dL (ref 9–23)
BUN / CREAT RATIO: 19
CALCIUM: 9.3 mg/dL (ref 8.7–10.4)
CHLORIDE: 105 mmol/L (ref 98–107)
CO2: 23 mmol/L (ref 20.0–31.0)
CREATININE: 0.86 mg/dL (ref 0.73–1.18)
EGFR CKD-EPI (2021) MALE: >90 mL/min/1.73m2 (ref >=60)
GLUCOSE RANDOM: 116 mg/dL (ref 70–179)
POTASSIUM: 3.7 mmol/L (ref 3.4–4.8)
PROTEIN TOTAL: 7.6 g/dL (ref 5.7–8.2)
SODIUM: 142 mmol/L (ref 135–145)

## 2023-06-13 LAB — TSH: THYROID STIMULATING HORMONE: 0.163 u[IU]/mL — ABNORMAL LOW (ref 0.550–4.780)

## 2023-06-13 LAB — T4, FREE: FREE T4: 1.59 ng/dL (ref 0.89–1.76)

## 2023-06-13 MED ADMIN — bevacizumab-bvzr (ZIRABEV) 2,019 mg in sodium chloride (NS) 0.9 % 101 mL IVPB: 15 mg/kg | INTRAVENOUS | @ 17:00:00 | Stop: 2023-06-13

## 2023-06-13 MED ADMIN — pembrolizumab (KEYTRUDA) 200 mg in sodium chloride (NS) 0.9 % 50 mL IVPB: 200 mg | INTRAVENOUS | @ 17:00:00 | Stop: 2023-06-13

## 2023-06-13 NOTE — Unmapped (Signed)
 1144: Treatment plan released.

## 2023-06-13 NOTE — Unmapped (Signed)
 Patient arrived to chair 51.  No complaints noted.  Access of PIV placed with + BR.  Labs drawn and urine POC sent.  Pt BP WNL to administer Bevacizumab.  Pt completed and tolerated treatment.  PIV removed with + BR prior.   AVS declined, pt left clinic ambulatory.

## 2023-06-13 NOTE — Unmapped (Signed)
 RED ZONE Means: RED ZONE: Take action now!     You need to be seen right away  Symptoms are at a severe level of discomfort    Call 911 or go to your nearest  Hospital for help     - Bleeding that will not stop    - Hard to breathe    - New seizure - Chest pain  - Fall or passing out  -Thoughts of hurting    yourself or others      Call 911 if you are going into the RED ZONE                  YELLOW ZONE Means:     Please call with any new or worsening symptom(s), even if not on this list.  Call [phone]  After hours, weekends, and holidays - you will reach a long recording with specific instructions, If not in an emergency such as above, please listen closely all the way to the end and choose the option that relates to your need.   You can be seen by a provider the same day through our Same Day Acute Care for Patients with Cancer program.      YELLOW ZONE: Take action today     Symptoms are new or worsening  You are not within your goal range for:    - Pain    - Shortness of breath    - Bleeding (nose, urine, stool, wound)    - Feeling sick to your stomach and throwing up    - Mouth sores/pain in your mouth or throat    - Hard stool or very loose stools (increase in       ostomy output)    - No urine for 12 hours    - Feeding tube or other catheter/tube issue    - Redness or pain at previous IV or port/catheter site    - Depressed or anxiety   - Swelling (leg, arm, abdomen,     face, neck)  - Skin rash or skin changes  - Wound issues (redness, drainage,    re-opened)  - Confusion  - Vision changes  - Fever >100.4 F or chills  - Worsening cough with mucus that is    green, yellow, or bloody  - Pain or burning when going to the    bathroom  - Home Infusion Pump Issue- call    [phone]         Call your healthcare provider if you are going into the YELLOW ZONE     GREEN ZONE Means:  Your symptoms are under controls  Continue to take your medicine as ordered  Keep all visits to the provider GREEN ZONE: You are in control  No increase or worsening symptoms  Able to take your medicine  Able to drink and eat    - DO NOT use MyChart messages to report red or yellow symptoms. Allow up to 3    business days for a reply.  -MyChart is for non-urgent medication refills, scheduling requests, or other general questions.         [id number] Rev. 07/13/2021  Approved by Oncology Patient Education Committee     Hospital Outpatient Visit on 06/13/2023   Component Date Value Ref Range Status    TSH 06/13/2023 0.163 (L)  0.550 - 4.780 uIU/mL Final    Free T4 06/13/2023 1.59  0.89 - 1.76 ng/dL Final    Sodium 08/65/7846 142  135 -  145 mmol/L Final    Potassium 06/13/2023 3.7  3.4 - 4.8 mmol/L Final    Chloride 06/13/2023 105  98 - 107 mmol/L Final    CO2 06/13/2023 23.0  20.0 - 31.0 mmol/L Final    Anion Gap 06/13/2023 14  5 - 14 mmol/L Final    BUN 06/13/2023 16  9 - 23 mg/dL Final    Creatinine 82/95/6213 0.86  0.73 - 1.18 mg/dL Final    BUN/Creatinine Ratio 06/13/2023 19   Final    eGFR CKD-EPI (2021) Male 06/13/2023 >90  >=60 mL/min/1.6m2 Final    eGFR calculated with CKD-EPI 2021 equation in accordance with [employer] and [employer] of Nephrology Task Force recommendations.    Glucose 06/13/2023 116  70 - 179 mg/dL Final    Calcium 08/65/7846 9.3  8.7 - 10.4 mg/dL Final    Albumin 96/29/5284 4.1  3.4 - 5.0 g/dL Final    Total Protein 06/13/2023 7.6  5.7 - 8.2 g/dL Final    Total Bilirubin 06/13/2023 0.3  0.3 - 1.2 mg/dL Final    AST 13/24/4010 22  <=34 U/L Final    ALT 06/13/2023 17  10 - 49 U/L Final    Alkaline Phosphatase 06/13/2023 111  46 - 116 U/L Final    WBC 06/13/2023 7.6  3.6 - 11.2 10*9/L Final    RBC 06/13/2023 4.66  4.26 - 5.60 10*12/L Final    HGB 06/13/2023 13.2  12.9 - 16.5 g/dL Final    HCT 27/25/3664 39.8  39.0 - 48.0 % Final    MCV 06/13/2023 85.4  77.6 - 95.7 fL Final    MCH 06/13/2023 28.2  25.9 - 32.4 pg Final    MCHC 06/13/2023 33.1  32.0 - 36.0 g/dL Final    RDW 40/34/7425 16.0 (H)  12.2 - 15.2 % Final    MPV 06/13/2023 7.2  6.8 - 10.7 fL Final    Platelet 06/13/2023 323  150 - 450 10*9/L Final    Neutrophils % 06/13/2023 71.0  % Final    Lymphocytes % 06/13/2023 17.9  % Final    Monocytes % 06/13/2023 7.5  % Final    Eosinophils % 06/13/2023 3.1  % Final    Basophils % 06/13/2023 0.5  % Final    Absolute Neutrophils 06/13/2023 5.4  1.8 - 7.8 10*9/L Final    Absolute Lymphocytes 06/13/2023 1.4  1.1 - 3.6 10*9/L Final    Absolute Monocytes 06/13/2023 0.6  0.3 - 0.8 10*9/L Final    Absolute Eosinophils 06/13/2023 0.2  0.0 - 0.5 10*9/L Final    Absolute Basophils 06/13/2023 0.0  0.0 - 0.1 10*9/L Final    Spec Gravity/POC 06/13/2023 1.025  1.003 - 1.030 Final    PH/POC 06/13/2023 5.0  5.0 - 9.0 Final    Leuk Esterase/POC 06/13/2023 Negative  Negative Final    Nitrite/POC 06/13/2023 Negative  Negative Final    Protein/POC 06/13/2023 1+ (A)  Negative Final    UA Glucose/POC 06/13/2023 Negative  Negative Final    Ketones, POC 06/13/2023 Negative  Negative Final    Bilirubin/POC 06/13/2023 Negative  Negative Final    Blood/POC 06/13/2023 Negative  Negative Final    Urobilinogen/POC 06/13/2023 0.2  0.2 - 1.0 mg/dL Final

## 2023-06-16 DIAGNOSIS — C434 Malignant melanoma of scalp and neck: Principal | ICD-10-CM

## 2023-06-16 DIAGNOSIS — C7931 Secondary malignant neoplasm of brain: Principal | ICD-10-CM

## 2023-06-17 ENCOUNTER — Ambulatory Visit: Admit: 2023-06-17 | Payer: BLUE CROSS/BLUE SHIELD | Attending: Clinical | Primary: Clinical

## 2023-06-17 DIAGNOSIS — C7931 Secondary malignant neoplasm of brain: Principal | ICD-10-CM

## 2023-06-17 DIAGNOSIS — C434 Malignant melanoma of scalp and neck: Principal | ICD-10-CM

## 2023-06-19 ENCOUNTER — Inpatient Hospital Stay: Admit: 2023-06-19 | Discharge: 2023-06-20 | Payer: BLUE CROSS/BLUE SHIELD

## 2023-06-19 ENCOUNTER — Emergency Department (HOSPITAL_COMMUNITY)

## 2023-06-19 ENCOUNTER — Observation Stay (HOSPITAL_COMMUNITY)
Admission: EM | Admit: 2023-06-19 | Discharge: 2023-06-20 | Disposition: A | Discharge status: Home / Self Care | Attending: Internal Medicine | Admitting: Internal Medicine

## 2023-06-19 ENCOUNTER — Encounter (HOSPITAL_COMMUNITY): Payer: Self-pay

## 2023-06-19 ENCOUNTER — Observation Stay (HOSPITAL_COMMUNITY)

## 2023-06-19 ENCOUNTER — Other Ambulatory Visit: Payer: Self-pay

## 2023-06-19 DIAGNOSIS — F32A Depression, unspecified: Secondary | ICD-10-CM | POA: Insufficient documentation

## 2023-06-19 DIAGNOSIS — R55 Syncope and collapse: Secondary | ICD-10-CM | POA: Insufficient documentation

## 2023-06-19 DIAGNOSIS — E785 Hyperlipidemia, unspecified: Secondary | ICD-10-CM | POA: Diagnosis not present

## 2023-06-19 DIAGNOSIS — G936 Cerebral edema: Secondary | ICD-10-CM | POA: Insufficient documentation

## 2023-06-19 DIAGNOSIS — R7402 Elevation of levels of lactic acid dehydrogenase (LDH): Secondary | ICD-10-CM | POA: Insufficient documentation

## 2023-06-19 DIAGNOSIS — C439 Malignant melanoma of skin, unspecified: Secondary | ICD-10-CM

## 2023-06-19 DIAGNOSIS — D649 Anemia, unspecified: Secondary | ICD-10-CM | POA: Insufficient documentation

## 2023-06-19 DIAGNOSIS — W19XXXA Unspecified fall, initial encounter: Secondary | ICD-10-CM | POA: Diagnosis not present

## 2023-06-19 DIAGNOSIS — G9009 Other idiopathic peripheral autonomic neuropathy: Secondary | ICD-10-CM | POA: Diagnosis not present

## 2023-06-19 DIAGNOSIS — C7931 Secondary malignant neoplasm of brain: Secondary | ICD-10-CM | POA: Diagnosis not present

## 2023-06-19 DIAGNOSIS — Z85850 Personal history of malignant neoplasm of thyroid: Secondary | ICD-10-CM | POA: Diagnosis not present

## 2023-06-19 DIAGNOSIS — Z8679 Personal history of other diseases of the circulatory system: Secondary | ICD-10-CM | POA: Diagnosis not present

## 2023-06-19 DIAGNOSIS — C801 Malignant (primary) neoplasm, unspecified: Secondary | ICD-10-CM | POA: Diagnosis not present

## 2023-06-19 DIAGNOSIS — M459 Ankylosing spondylitis of unspecified sites in spine: Secondary | ICD-10-CM | POA: Diagnosis not present

## 2023-06-19 DIAGNOSIS — I10 Essential (primary) hypertension: Secondary | ICD-10-CM | POA: Insufficient documentation

## 2023-06-19 DIAGNOSIS — R569 Unspecified convulsions: Secondary | ICD-10-CM | POA: Diagnosis present

## 2023-06-19 DIAGNOSIS — E890 Postprocedural hypothyroidism: Secondary | ICD-10-CM | POA: Insufficient documentation

## 2023-06-19 DIAGNOSIS — G4089 Other seizures: Secondary | ICD-10-CM | POA: Diagnosis not present

## 2023-06-19 DIAGNOSIS — C434 Malignant melanoma of scalp and neck: Principal | ICD-10-CM

## 2023-06-19 HISTORY — DX: Polyneuropathy, unspecified: G62.9

## 2023-06-19 HISTORY — DX: Depression, unspecified: F32.A

## 2023-06-19 HISTORY — DX: Secondary malignant neoplasm of brain: C79.31

## 2023-06-19 LAB — BASIC METABOLIC PANEL WITH GFR
Anion gap: 13 (ref 5–15)
BUN: 13 mg/dL (ref 6–20)
CO2: 21 mmol/L — ABNORMAL LOW (ref 22–32)
Calcium: 7.8 mg/dL — ABNORMAL LOW (ref 8.9–10.3)
Chloride: 105 mmol/L (ref 98–111)
Creatinine, Ser: 0.85 mg/dL (ref 0.61–1.24)
GFR, Estimated: >60 mL/min (ref >60)
Glucose, Bld: 102 mg/dL — ABNORMAL HIGH (ref 70–99)
Potassium: 4.2 mmol/L (ref 3.5–5.1)
Sodium: 139 mmol/L (ref 135–145)

## 2023-06-19 LAB — CBC
HCT: 39.3 % (ref 39.0–52.0)
Hemoglobin: 12.6 g/dL — ABNORMAL LOW (ref 13.0–17.0)
MCH: 28.6 pg (ref 26.0–34.0)
MCHC: 32.1 g/dL (ref 30.0–36.0)
MCV: 89.1 fL (ref 80.0–100.0)
Platelets: 273 10*3/uL (ref 150–400)
RBC: 4.41 MIL/uL (ref 4.22–5.81)
RDW: 14.9 % (ref 11.5–15.5)
WBC: 7.4 10*3/uL (ref 4.0–10.5)
nRBC: 0 % (ref 0.0–0.2)

## 2023-06-19 LAB — I-STAT CG4 LACTIC ACID, ED
Lactic Acid, Venous: 2.5 mmol/L (ref 0.5–1.9)
Lactic Acid, Venous: 2.9 mmol/L (ref 0.5–1.9)

## 2023-06-19 LAB — LACTIC ACID, PLASMA
Lactic Acid, Venous: 2.7 mmol/L (ref 0.5–1.9)
Lactic Acid, Venous: 3.7 mmol/L (ref 0.5–1.9)
Lactic Acid, Venous: 2.1 mmol/L (ref 0.5–1.9)

## 2023-06-19 LAB — CK: Total CK: 125 U/L (ref 49–397)

## 2023-06-19 LAB — TROPONIN I (HIGH SENSITIVITY)
Troponin I (High Sensitivity): 4 ng/L (ref <18)
Troponin I (High Sensitivity): 6 ng/L (ref <18)

## 2023-06-19 LAB — MAGNESIUM: Magnesium: 2 mg/dL (ref 1.7–2.4)

## 2023-06-19 LAB — HIV ANTIBODY (ROUTINE TESTING W REFLEX): HIV Screen 4th Generation wRfx: NONREACTIVE

## 2023-06-19 MED ORDER — EZETIMIBE 10 MG PO TABS
10.0000 mg | ORAL_TABLET | Freq: Every day | ORAL | Status: DC
  Administered 2023-06-20: 10 mg via ORAL
  Filled 2023-06-19: qty 1

## 2023-06-19 MED ORDER — SULFASALAZINE 500 MG PO TABS
1000.0000 mg | ORAL_TABLET | Freq: Two times a day (BID) | ORAL | Status: DC
  Administered 2023-06-19 – 2023-06-20 (×2): 1000 mg via ORAL
  Filled 2023-06-19 (×3): qty 2

## 2023-06-19 MED ORDER — AMLODIPINE BESYLATE 5 MG PO TABS
10.0000 mg | ORAL_TABLET | Freq: Every day | ORAL | Status: DC
  Administered 2023-06-20: 10 mg via ORAL
  Filled 2023-06-19: qty 2

## 2023-06-19 MED ORDER — CALCIUM CARBONATE 1250 (500 CA) MG PO CHEW
2500.0000 mg | CHEWABLE_TABLET | Freq: Two times a day (BID) | ORAL | Status: DC
  Filled 2023-06-19: qty 2

## 2023-06-19 MED ORDER — FOLIC ACID 1 MG PO TABS
1.0000 mg | ORAL_TABLET | Freq: Every day | ORAL | Status: DC
  Administered 2023-06-20: 1 mg via ORAL
  Filled 2023-06-19 (×2): qty 1

## 2023-06-19 MED ORDER — FLUTICASONE PROPIONATE 50 MCG/ACT NA SUSP: 1.0000 | Freq: Every day | NASAL | Status: DC | PRN

## 2023-06-19 MED ORDER — ACETAMINOPHEN 325 MG PO TABS
650.0000 mg | ORAL_TABLET | Freq: Four times a day (QID) | ORAL | Status: DC | PRN
Start: 2023-06-19 — End: 2023-06-20

## 2023-06-19 MED ORDER — VITAMIN C 500 MG PO TABS
1000.0000 mg | ORAL_TABLET | Freq: Every day | ORAL | Status: DC
  Administered 2023-06-20: 1000 mg via ORAL
  Filled 2023-06-19: qty 2

## 2023-06-19 MED ORDER — LEVOTHYROXINE SODIUM 75 MCG PO TABS
375.0000 ug | ORAL_TABLET | Freq: Every day | ORAL | Status: DC
  Administered 2023-06-20: 375 ug via ORAL
  Filled 2023-06-19: qty 1

## 2023-06-19 MED ORDER — CALCIUM CARBONATE 1250 (500 CA) MG PO TABS
2.0000 | ORAL_TABLET | Freq: Two times a day (BID) | ORAL | Status: DC
  Administered 2023-06-19 – 2023-06-20 (×2): 2500 mg via ORAL
  Filled 2023-06-19 (×2): qty 2

## 2023-06-19 MED ORDER — ACETAMINOPHEN 650 MG RE SUPP: 650.0000 mg | Freq: Four times a day (QID) | RECTAL | Status: DC | PRN

## 2023-06-19 MED ORDER — CALCITRIOL 0.25 MCG PO CAPS
0.2500 ug | ORAL_CAPSULE | Freq: Every day | ORAL | Status: DC
  Administered 2023-06-20: 0.25 ug via ORAL
  Filled 2023-06-19: qty 1

## 2023-06-19 MED ORDER — GADOBUTROL 1 MMOL/ML IV SOLN
10.0000 mL | Freq: Once | INTRAVENOUS | Status: AC | PRN
  Administered 2023-06-19: 10 mL via INTRAVENOUS

## 2023-06-19 MED ORDER — LISINOPRIL 10 MG PO TABS
10.0000 mg | ORAL_TABLET | Freq: Every day | ORAL | Status: DC
  Administered 2023-06-20: 10 mg via ORAL
  Filled 2023-06-19: qty 1

## 2023-06-19 MED ORDER — LACTATED RINGERS IV BOLUS
1000.0000 mL | Freq: Once | INTRAVENOUS | Status: AC
  Administered 2023-06-19: 1000 mL via INTRAVENOUS

## 2023-06-19 MED ORDER — DEXAMETHASONE 2 MG PO TABS
2.0000 mg | ORAL_TABLET | Freq: Two times a day (BID) | ORAL | Status: DC
  Administered 2023-06-20: 2 mg via ORAL
  Filled 2023-06-19: qty 1

## 2023-06-19 MED ORDER — LEVETIRACETAM (KEPPRA) 500 MG/5 ML ADULT IV PUSH
1500.0000 mg | Freq: Once | INTRAVENOUS | Status: AC
  Administered 2023-06-19: 1500 mg via INTRAVENOUS
  Filled 2023-06-19: qty 15

## 2023-06-19 MED ORDER — DEXAMETHASONE SODIUM PHOSPHATE 10 MG/ML IJ SOLN
10.0000 mg | Freq: Once | INTRAMUSCULAR | Status: AC
  Administered 2023-06-19: 10 mg via INTRAVENOUS
  Filled 2023-06-19: qty 1

## 2023-06-19 MED ORDER — POLYETHYLENE GLYCOL 3350 17 G PO PACK
17.0000 g | PACK | Freq: Every day | ORAL | Status: DC | PRN
Start: 2023-06-19 — End: 2023-06-20

## 2023-06-19 MED ORDER — DULOXETINE HCL 30 MG PO CPEP
30.0000 mg | ORAL_CAPSULE | Freq: Two times a day (BID) | ORAL | Status: DC
  Administered 2023-06-19 – 2023-06-20 (×2): 30 mg via ORAL
  Filled 2023-06-19 (×2): qty 1

## 2023-06-19 MED ORDER — ZINC SULFATE 220 (50 ZN) MG PO CAPS
220.0000 mg | ORAL_CAPSULE | Freq: Every day | ORAL | Status: DC
  Administered 2023-06-20: 220 mg via ORAL
  Filled 2023-06-19: qty 1

## 2023-06-19 MED ORDER — LEVETIRACETAM 500 MG PO TABS
500.0000 mg | ORAL_TABLET | Freq: Two times a day (BID) | ORAL | Status: DC
  Administered 2023-06-19 – 2023-06-20 (×2): 500 mg via ORAL
  Filled 2023-06-19 (×2): qty 1

## 2023-06-19 MED ORDER — BUDESONIDE 3 MG PO CPEP
6.0000 mg | ORAL_CAPSULE | Freq: Every day | ORAL | Status: DC
  Administered 2023-06-20: 6 mg via ORAL
  Filled 2023-06-19: qty 2

## 2023-06-19 NOTE — ED Notes (Signed)
 CCMD called and notified

## 2023-06-19 NOTE — ED Notes (Signed)
 RN called phlebotomy to help stick patient

## 2023-06-19 NOTE — ED Notes (Signed)
 Pt had 1 minute seizure with expressive aphasia witnessed by RN and DR. Cabell

## 2023-06-19 NOTE — Progress Notes (Signed)
 Routine EEG completed, results pending Neurology review and interpretation

## 2023-06-19 NOTE — ED Notes (Signed)
 C collar removed

## 2023-06-19 NOTE — Unmapped (Signed)
 Copied from CRM #[id number]. Topic: Care Management - MD/Provider-to-Provider Call  >> Jun 19, 2023 10:51 AM Verlyn Goad wrote:  Hi Dr. Patria Bookbinder,    Dr. Audie Bleacher Neurosurgeon [facility] in [place] has contacted the Communication Center requesting to speak with you directly regarding the following:    -Calling to discuss patient who is currently in their care    Dr. Cabbel is available for a call back at [phone]    A page has also been sent.    Thank you,  Elia Groom  [facility]  [phone]

## 2023-06-19 NOTE — Unmapped (Signed)
 This is from Dr. Patria Bookbinder:    I will express my thoughts over this venue so as to have everybody know what I think is happening. I spoke with the neurosurgeon. He even texted me a video of today's MRI. We questioned the slice thickness in THEIR MRI today (it was 3 mm) vs. our MRI 06/05/23. He does not have anything to compare and he acknowledges that. I think the lesions in their brain MRI look bigger from ours because he is on day 6 on single agent pembrolizumab that may be causing higher peritumoral edema. Seizures were noticed in [facility]'s single agent pembrolizumab study 10+ years ago. So I am hopeful. Nevertheless, we need to: A) get the films here to compare; they must look bigger, not more in number, B) treat him for the worse case scenario of progression, namely increase his decadron (I asked him not to go wild with decadron; he is currently on 1mg  every day; just increase to 2 mg bid; it is his prerogative if he follows that recommendation, C) start him on his seizure medications, D) start the home supply of enco-bini. No need for transfer. See (or phone him). Since he did not bleed, I am hopeful that this is a good thing. Over time we will peel off corticosteroids, peel off enco-bini

## 2023-06-19 NOTE — ED Notes (Signed)
 Lactic 2.7-provider made aware
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
## 2023-06-19 NOTE — Consult Note (Signed)
 Reason for Consult:seizure, cerebral metastatic melanoma  Referring Physician: ED  HAGEN BOHORQUEZ is an 48 y.o. male.  HPI: with a complicated history of melanoma, and metastatic melanoma to the brain. Status post right frontal craniotomy for tumor resection and gamma tile placement. He was seizing this morning at a hotel and subsequently brought to [facility] for care. All of his cancer care has been at [facility]. I have spoken with his oncologist, and sent him a portion of his MRI earlier today. There are numerous lesions on today's Mri brain. I was asked to ensure he is on Decadron 2mg  bid. He was started on Keppra upon admission. Despite this he did have a brief episode of expressive aphasia in the ED.   Past Medical History:  Diagnosis Date   Depression    Diverticulitis    Melanoma (HCC)    Melanoma metastatic to brain (HCC)    Neuropathy    Spondylitis, ankylosing (HCC)    Thyroid cancer (HCC)    Umbilical hernia     Past Surgical History:  Procedure Laterality Date   BRAIN SURGERY     "Multiple"   COLON SURGERY     CRANIOTOMY FOR TUMOR     With gamma tiles placed   HERNIA REPAIR     "Multiple"   LYMPH NODE DISSECTION     THYROID SURGERY      Family History  Problem Relation Age of Onset   Atrial fibrillation Mother    Sarcoidosis Mother    Coronary artery disease Father    Heart failure Father    Hypertension Father    Colon cancer Father    Diabetes Father    Kidney failure Father     Social History:  reports that he has never smoked. He has never used smokeless tobacco. He reports current alcohol use. No history on file for drug use.  Allergies:  Allergies  Allergen Reactions   Coconut Flavoring Agent (Non-Screening) Nausea Only    coconut   Compazine [Prochlorperazine] Rash    Medications: I have reviewed the patient's current medications.  Results for orders placed or performed during the hospital encounter of 06/19/23 (from the past 48 hours)   CBC     Status: Abnormal   Collection Time: 06/19/23  8:24 AM  Result Value Ref Range   WBC 7.4 4.0 - 10.5 K/uL   RBC 4.41 4.22 - 5.81 MIL/uL   Hemoglobin 12.6 (L) 13.0 - 17.0 g/dL   HCT 29.5 62.1 - 30.8 %   MCV 89.1 80.0 - 100.0 fL   MCH 28.6 26.0 - 34.0 pg   MCHC 32.1 30.0 - 36.0 g/dL   RDW 65.7 84.6 - 96.2 %   Platelets 273 150 - 400 K/uL   nRBC 0.0 0.0 - 0.2 %    Comment: Performed at [facility], [address], [place], [place] [address]  Basic metabolic panel     Status: Abnormal   Collection Time: 06/19/23  8:24 AM  Result Value Ref Range   Sodium 139 135 - 145 mmol/L   Potassium 4.2 3.5 - 5.1 mmol/L   Chloride 105 98 - 111 mmol/L   CO2 21 (L) 22 - 32 mmol/L   Glucose, Bld 102 (H) 70 - 99 mg/dL    Comment: Glucose reference range applies only to samples taken after fasting for at least 8 hours.   BUN 13 6 - 20 mg/dL   Creatinine, Ser 1.32 0.61 - 1.24 mg/dL  Calcium  7.8 (L) 8.9 - 10.3 mg/dL   GFR, Estimated >82 >95 mL/min    Comment: (NOTE) Calculated using the CKD-EPI Creatinine Equation (2021)    Anion gap 13 5 - 15    Comment: Performed at [facility], [address], [place], [place] [address]  Magnesium     Status: None   Collection Time: 06/19/23  8:24 AM  Result Value Ref Range   Magnesium 2.0 1.7 - 2.4 mg/dL    Comment: Performed at [facility], [address], [place], [place] [address]  Troponin I (High Sensitivity)     Status: None   Collection Time: 06/19/23  8:24 AM  Result Value Ref Range   Troponin I (High Sensitivity) 4 <18 ng/L    Comment: (NOTE) Elevated high sensitivity troponin I (hsTnI) values and significant  changes across serial measurements may suggest ACS but many other  chronic and acute conditions are known to elevate hsTnI results.  Refer to the "Links" section for chest pain algorithms and additional  guidance. Performed at [facility], [address] [place], [place] [address]   I-Stat CG4 Lactic  Acid     Status: Abnormal   Collection Time: 06/19/23  8:30 AM  Result Value Ref Range   Lactic Acid, Venous 2.5 (HH) 0.5 - 1.9 mmol/L   Comment NOTIFIED PHYSICIAN   I-Stat CG4 Lactic Acid     Status: Abnormal   Collection Time: 06/19/23 11:49 AM  Result Value Ref Range   Lactic Acid, Venous 2.9 (HH) 0.5 - 1.9 mmol/L   Comment MD NOTIFIED, SUGGEST RECOLLECT   Troponin I (High Sensitivity)     Status: None   Collection Time: 06/19/23 12:53 PM  Result Value Ref Range   Troponin I (High Sensitivity) 6 <18 ng/L    Comment: (NOTE) Elevated high sensitivity troponin I (hsTnI) values and significant  changes across serial measurements may suggest ACS but many other  chronic and acute conditions are known to elevate hsTnI results.  Refer to the "Links" section for chest pain algorithms and additional  guidance. Performed at [facility], [address], [place], [place] [address]   HIV Antibody (routine testing w rflx)     Status: None   Collection Time: 06/19/23  3:39 PM  Result Value Ref Range   HIV Screen 4th Generation wRfx Non Reactive Non Reactive    Comment: Performed at [facility], [address], [place], [place] [address]  CK     Status: None   Collection Time: 06/19/23  3:39 PM  Result Value Ref Range   Total CK 125 49 - 397 U/L    Comment: Performed at [facility], [address], [place], [place] [address]  Lactic acid, plasma     Status: Abnormal   Collection Time: 06/19/23  3:39 PM  Result Value Ref Range   Lactic Acid, Venous 2.7 (HH) 0.5 - 1.9 mmol/L    Comment: CRITICAL RESULT CALLED TO, READ BACK BY AND VERIFIED WITH Jeryl Moris, R RN @ [phone] 06/19/23 LEONARD,A Performed at [facility], [address], [place], [place] [address]     EEG adult Result Date: 06/19/2023 Arleene Lack, MD     06/19/2023  5:14 PM Patient Name: Lee Castillo MRN: [id number] Epilepsy Attending: Arleene Lack Referring Physician/Provider: Malen Scudder, DO  Date: 06/19/2023 Duration: 31.45 mins Patient history: 48 y.o. male with past medical history of metastatic  melanoma to the brain, thyroid cancer, HTN, HLD, who presented after witnessed seizure-like activity. EEG to evaluate for seizure Level of alertness: Awake AEDs during EEG study: LEV Technical aspects: This EEG study was done with scalp electrodes positioned according to the 10-20 International system of electrode placement. Electrical activity was reviewed with band pass filter of 1-70Hz , sensitivity of 7 uV/mm, display speed of 73mm/sec with a 60Hz  notched filter applied as appropriate. EEG data were recorded continuously and digitally stored.  Video monitoring was available and reviewed as appropriate. Description: The posterior dominant rhythm consists of 9 Hz activity of moderate voltage (25-35 uV) seen predominantly in posterior head regions, symmetric and reactive to eye opening and eye closing. EEG showed intermittent polymorphic sharply contoured 3 to 6 Hz theta-delta slowing in right frontal region.  Hyperventilation and photic stimulation were not performed.   ABNORMALITY - Intermittent slow, right frontal region IMPRESSION: This study is suggestive of cortical dysfunction arising from right frontal region likely secondary to underlying structural abnormality. No seizures or epileptiform discharges were seen throughout the recording. Arleene Lack   MR Brain W and Wo Contrast Result Date: 06/19/2023 CLINICAL DATA:  Provided history: Metastatic disease evaluation. Additional history obtained from electronic MEDICAL RECORD NUMBERHistory of melanoma and thyroid cancer. EXAM: MRI HEAD WITHOUT AND WITH CONTRAST TECHNIQUE: Multiplanar, multiecho pulse sequences of the brain and surrounding structures were obtained without and with intravenous contrast. CONTRAST:  10mL GADAVIST GADOBUTROL 1 MMOL/ML IV SOLN COMPARISON:  Head CT performed earlier today 06/19/2023. FINDINGS: Brain: No age-advanced or lobar  predominant cerebral atrophy. Postoperative changes from prior right frontal craniotomy. Focus of encephalomalacia/gliosis in the underlying right frontal lobe. Associated surgical clips and chronic blood products at this site. There are innumerable lesions scattered within the supratentorial greater than infratentorial brain (measuring up to 11 mm), compatible with metastases. The majority of these lesions demonstrated precontrast T1 hyperintense signal which limits evaluation for post-contrast enhancement. However some lesions show definite enhancement. Gyriform focus of enhancement along the left frontal operculum and insula, which may reflect an additional parenchymal metastasis and/or leptomeningeal metastatic disease. Numerous lesions have associated susceptibility-weighted signal loss and appear hemorrhagic. Edema surrounding multiple lesions, greatest within the anterior right frontal lobe/genu of corpus callosum, left frontal operculum and left temporal lobe. No midline shift. Chronic hemosiderin staining along the bilateral parietooccipital lobes, within the ventricular system, along the cerebellum, along the medulla and along the visualized upper cervical spinal cord compatible with sequelae of prior subarachnoid hemorrhage. Background advanced patchy and confluent T2 FLAIR hyperintense signal abnormality within the cerebral white matter, nonspecific but likely at least partly reflecting treatment related changes. Generalized cerebellar atrophy. There is no acute infarct. Vascular: Maintained flow voids within the proximal large arterial vessels. Skull and upper cervical spine: Right frontal cranioplasty. 12 mm T2 hyperintense and enhancing lesion within the left parietal calvarium (series 6, image 37). Sinuses/Orbits: No mass or acute finding within the imaged orbits. Large mucous retention cyst occupying much of the left maxillary sinus. 11 mm mucous retention cyst, and minimal background mucosal  thickening, within the right maxillary sinus. Other: Posterior scalp hematoma. IMPRESSION: 1. Innumerable lesions within the supratentorial greater than infratentorial brain consistent with metastases (measuring up to 11 mm). Gyriform focus of enhancement along the left frontal operculum and insula, which may reflect an additional parenchymal metastasis and/or leptomeningeal metastatic disease. Many lesions appear hemorrhagic. Edema surrounding multiple lesions, greatest within the anterior right frontal lobe/genu of corpus callosum, left frontal operculum and left temporal  lobe. No midline shift. 2. Prior right frontal craniotomy with resection cavity in the underlying right frontal lobe. 3. Chronic blood products along the bilateral parietooccipital lobes, within the ventricular system, along the cerebellum, along the medulla and along the visualized upper cervical spinal cord compatible with sequelae of prior subarachnoid hemorrhage. 4. Background advanced cerebral white matter disease, nonspecific but likely at least partly reflecting treatment-related changes. 5. Indeterminate 12 mm enhancing lesion within the left parietal calvarium. An osseous metastasis is a consideration. Attention recommended on imaging follow-up. 6. Paranasal sinus disease as described. Electronically Signed   By: Bascom Lily D.O.   On: 06/19/2023 11:45   CT Head Wo Contrast Addendum Date: 06/19/2023 ADDENDUM REPORT: 06/19/2023 09:40 ADDENDUM: These results were called by telephone at the time of interpretation on 06/19/2023 at 9:40 am to provider [facility] , who verbally acknowledged these results. Electronically Signed   By: Denny Flack M.D.   On: 06/19/2023 09:40   Result Date: 06/19/2023 CLINICAL DATA:  Seizure, new onset. Reported history of metastatic melanoma with intracranial lesions and history of craniotomy. EXAM: CT HEAD WITHOUT CONTRAST TECHNIQUE: Contiguous axial images were obtained from the base of the skull through  the vertex without intravenous contrast. RADIATION DOSE REDUCTION: This exam was performed according to the departmental dose-optimization program which includes automated exposure control, adjustment of the mA and/or kV according to patient size and/or use of iterative reconstruction technique. COMPARISON:  None Available. FINDINGS: Brain: There is a 1.0 x 0.7 x 0.8 cm focus of hemorrhage within the anterior inferior right frontal lobe with surrounding vasogenic edema. Underlying hemorrhagic lesion cannot be excluded. There are scattered foci of subarachnoid hemorrhage particularly along the right sylvian fissure. Additional 0.6 cm hemorrhagic focus within the anterior left temporal lobe with adjacent vasogenic edema. Similar small scattered hemorrhagic foci in the left temporal lobe (axial image 8), inferior right frontal lobe (axial image 9, along the right external capsule (axial image 12), left frontal lobe (axial image 19). Postsurgical changes of right frontal craniotomy. Underlying postsurgical changes and encephalomalacia in the right frontal lobe with multiple metallic surgical clips noted. Hypoattenuation in the periventricular and subcortical white matter which could reflect chronic microvascular ischemic changes versus post treatment changes. Mild parenchymal volume loss. The basilar cisterns are patent. Vascular: No hyperdense vessel or unexpected calcification. Skull: Postsurgical changes.  No acute or aggressive findings. Sinuses/Orbits: Visualized orbits are unremarkable. Complete opacification of the partially visualized left maxillary sinus. Other: Mastoid air cells are clear. Soft tissue swelling in the right parietal scalp. IMPRESSION: Numerous hyperattenuating foci in the bilateral cerebral hemispheres, the largest in the anterior inferior left frontal lobe measuring up to 1.0 cm concerning for hemorrhagic intracranial lesions. Findings are compatible with reported history of metastatic  melanoma. Multiple foci of vasogenic edema noted. Recommend contrast-enhanced MRI for further evaluation and consider obtaining outside prior images for comparison if available. Subarachnoid hemorrhage noted primarily in the right sylvian fissure with additional small foci along the anterior inferior frontal lobes. No midline shift. Postsurgical changes in the right frontal lobe. Electronically Signed: By: Denny Flack M.D. On: 06/19/2023 09:31   DG Chest Portable 1 View Result Date: 06/19/2023 CLINICAL DATA:  syncope EXAM: PORTABLE CHEST - 1 VIEW COMPARISON:  06/19/2016 FINDINGS: Low lung volumes.  No focal infiltrate or overt edema. Heart size and mediastinal contours are within normal limits. No effusion. Bilateral shoulder DJD.  Surgical clips at the thoracic inlet. IMPRESSION: Low volumes. No acute findings. Electronically Signed  By: Nicoletta Barrier M.D.   On: 06/19/2023 09:19    Review of Systems  Constitutional: Negative.   HENT: Negative.    Eyes: Negative.   Respiratory: Negative.    Cardiovascular: Negative.   Gastrointestinal: Negative.   Endocrine: Negative.   Genitourinary: Negative.   Musculoskeletal: Negative.   Skin: Negative.   Allergic/Immunologic: Negative.   Neurological:  Positive for seizures and speech difficulty.  Hematological: Negative.   Psychiatric/Behavioral: Negative.     Blood pressure 112/87, pulse (!) 116, temperature 98.6 F (37 C), temperature source Oral, resp. rate 16, height 6\' 2"  (1.88 m), weight 136.1 kg, SpO2 97%. Physical Exam Constitutional:      General: He is not in acute distress.    Appearance: Normal appearance. He is normal weight. He is not ill-appearing.  HENT:     Head: Normocephalic.     Comments: Multiple well healed scars on scalp    Right Ear: External ear normal.     Left Ear: External ear normal.     Nose: Nose normal.     Mouth/Throat:     Mouth: Mucous membranes are moist.     Pharynx: Oropharynx is clear.  Eyes:      Extraocular Movements: Extraocular movements intact.     Conjunctiva/sclera: Conjunctivae normal.     Pupils: Pupils are equal, round, and reactive to light.  Cardiovascular:     Rate and Rhythm: Normal rate and regular rhythm.  Pulmonary:     Effort: Pulmonary effort is normal.  Abdominal:     General: Abdomen is flat.     Palpations: Abdomen is soft.  Musculoskeletal:        General: Normal range of motion.     Cervical back: Normal range of motion.  Skin:    General: Skin is warm and dry.  Neurological:     Mental Status: He is alert and oriented to person, place, and time. Mental status is at baseline.     Cranial Nerves: No cranial nerve deficit.     Sensory: No sensory deficit.     Motor: No weakness.     Coordination: Coordination is intact.     Deep Tendon Reflexes: Reflexes normal. Babinski sign absent on the right side. Babinski sign absent on the left side.  Psychiatric:        Mood and Affect: Mood normal.        Behavior: Behavior normal.        Thought Content: Thought content normal.        Judgment: Judgment normal.     Assessment/Plan: Admit for seizure control. No indication for repeat imaging unless exam were to change. Dr. Clayton Curd Moschos notified of admission.   Audie Bleacher 06/19/2023, 7:18 PM

## 2023-06-19 NOTE — ED Provider Notes (Signed)
 [place] EMERGENCY DEPARTMENT AT [facility] Provider Note   CSN: [id number] Arrival date & time: 06/19/23  2956     History  Chief Complaint  Patient presents with   Seizures    Lee Castillo is a 48 y.o. male.  48 year old male presenting emergency department by EMS after seizure versus syncopal type episode.  Reportedly had an episode for 90 seconds of seizure.  No other descriptors from EMS.  Patient does not recall event.  He is alert and oriented x 3.  Some Einar pain to the back of the head, otherwise normal self.  No preceding symptoms.  Denies chest pain, shortness of breath currently.  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   Seizures      Home Medications Prior to Admission medications   Medication Sig Start Date End Date Taking? Authorizing Provider  acetaminophen  (TYLENOL ) 325 MG tablet Take 650 mg by mouth every 6 (six) hours as needed for mild pain (pain score 1-3), moderate pain (pain score 4-6) or headache.   Yes [provider]  amLODipine (NORVASC) 5 MG tablet Take 10 mg by mouth daily.   Yes [provider]  ascorbic acid (VITAMIN C) 500 MG tablet Take 1,000 mg by mouth daily.   Yes [provider]  budesonide (ENTOCORT EC) 3 MG 24 hr capsule Take 6 mg by mouth daily.   Yes [provider]  calcitRIOL  (ROCALTROL ) 0.25 MCG capsule For bone health.  Resume after cleared by [facility]. Patient taking differently: Take 0.25 mcg by mouth daily. 06/20/16  Yes Monetta Angst, PA-C  calcium  carbonate (OS-CAL) 1250 (500 Ca) MG chewable tablet REsume when taking a regular diet/per [facility] recommendations Patient taking differently: 2 tablets 2 (two) times daily. 06/20/16  Yes Monetta Angst, PA-C  dexamethasone (DECADRON) 1 MG tablet Take 1 mg by mouth daily with breakfast.   Yes [provider]  DULoxetine (CYMBALTA) 30 MG capsule Take 30 mg by mouth 2 (two) times daily.   Yes [provider]  ezetimibe (ZETIA) 10 MG tablet Take 10 mg by mouth daily.   Yes [provider]  fexofenadine (ALLEGRA) 180 MG tablet Take 180 mg by mouth every morning. 08/20/10  Yes [provider]  fluticasone (FLONASE) 50 MCG/ACT nasal spray Place 1 spray into both nostrils daily.   Yes [provider]  folic acid (FOLVITE) 1 MG tablet Take 1 mg by mouth daily.   Yes [provider]  levothyroxine  (SYNTHROID , LEVOTHROID) 125 MCG tablet Resume per instructions per [facility].  For low thyroid function Patient taking differently: Take 375 mcg by mouth daily before breakfast. 06/20/16  Yes Monetta Angst, PA-C  lisinopril (ZESTRIL) 10 MG tablet Take 10 mg by mouth daily.   Yes [provider]  Multiple Vitamin (MULTIVITAMIN WITH MINERALS) TABS tablet Take 1 tablet by mouth daily.   Yes [provider]  oxyCODONE (OXY IR/ROXICODONE) 5 MG immediate release tablet Take 5 mg by mouth every 4 (four) hours as needed. 04/18/23  Yes [provider]  sulfaSALAzine (AZULFIDINE) 500 MG tablet Take 1,000 mg by mouth 2 (two) times daily.   Yes [provider]  zinc sulfate, 50mg  elemental zinc, 220 (50 Zn) MG capsule Take 220 mg by mouth daily.   Yes [provider]  BRAFTOVI 75 MG capsule Take by mouth. Patient not taking: Reported on 06/19/2023    [provider]  [name] 15 MG tablet Take by mouth. Patient not taking: Reported  on 06/19/2023    [provider]      Allergies    Coconut flavoring agent (non-screening) and Compazine [prochlorperazine]    Review of Systems   Review of Systems  Neurological:  Positive for seizures.    Physical Exam Updated Vital Signs BP (!) 153/97   Pulse 76   Temp 98.7 F (37.1 C) (Oral)   Resp 18   Ht 6\' 2"  (1.88 m)   Wt 136.1 kg   SpO2 99%   BMI 38.52 kg/m  Physical Exam Vitals and nursing note reviewed.  Constitutional:      General: He is not in acute distress.    Appearance:  He is not toxic-appearing.  HENT:     Head:     Comments: Prior craniotomy scar to right parietal area.  At the posterior crown of head there is a small quarter sized hematoma with some superficial abrasion with small punctate areas of venous type oozing.  No laceration.    Mouth/Throat:     Mouth: Mucous membranes are moist.  Eyes:     Conjunctiva/sclera: Conjunctivae normal.  Neck:     Comments: In c-collar.  Removed.  No midline tenderness.  Able to ROM without pain. Cardiovascular:     Rate and Rhythm: Normal rate.  Pulmonary:     Effort: Pulmonary effort is normal.     Breath sounds: Normal breath sounds.  Abdominal:     General: There is no distension.     Tenderness: There is no abdominal tenderness. There is no guarding or rebound.  Skin:    General: Skin is warm.     Capillary Refill: Capillary refill takes less than 2 seconds.  Neurological:     General: No focal deficit present.     Mental Status: He is alert and oriented to person, place, and time.     Cranial Nerves: No cranial nerve deficit.     Sensory: No sensory deficit.     Motor: No weakness.     Coordination: Coordination normal.  Psychiatric:        Mood and Affect: Mood normal.        Behavior: Behavior normal.     ED Results / Procedures / Treatments   Labs (all labs ordered are listed, but only abnormal results are displayed) Labs Reviewed  CBC - Abnormal; Notable for the following components:      Result Value   Hemoglobin 12.6 (*)    All other components within normal limits  BASIC METABOLIC PANEL WITH GFR - Abnormal; Notable for the following components:   CO2 21 (*)    Glucose, Bld 102 (*)    Calcium  7.8 (*)    All other components within normal limits  I-STAT CG4 LACTIC ACID, ED - Abnormal; Notable for the following components:   Lactic Acid, Venous 2.5 (*)    All other components within normal limits  I-STAT CG4 LACTIC ACID, ED - Abnormal; Notable for the following components:   Lactic  Acid, Venous 2.9 (*)    All other components within normal limits  MAGNESIUM  HIV ANTIBODY (ROUTINE TESTING W REFLEX)  CK  LACTIC ACID, PLASMA  LACTIC ACID, PLASMA  TROPONIN I (HIGH SENSITIVITY)  TROPONIN I (HIGH SENSITIVITY)    EKG None  Radiology MR Brain W and Wo Contrast Result Date: 06/19/2023 CLINICAL DATA:  Provided history: Metastatic disease evaluation. Additional history obtained from electronic MEDICAL RECORD NUMBERHistory of melanoma and thyroid cancer. EXAM: MRI HEAD WITHOUT AND WITH  CONTRAST TECHNIQUE: Multiplanar, multiecho pulse sequences of the brain and surrounding structures were obtained without and with intravenous contrast. CONTRAST:  10mL GADAVIST GADOBUTROL 1 MMOL/ML IV SOLN COMPARISON:  Head CT performed earlier today 06/19/2023. FINDINGS: Brain: No age-advanced or lobar predominant cerebral atrophy. Postoperative changes from prior right frontal craniotomy. Focus of encephalomalacia/gliosis in the underlying right frontal lobe. Associated surgical clips and chronic blood products at this site. There are innumerable lesions scattered within the supratentorial greater than infratentorial brain (measuring up to 11 mm), compatible with metastases. The majority of these lesions demonstrated precontrast T1 hyperintense signal which limits evaluation for post-contrast enhancement. However some lesions show definite enhancement. Gyriform focus of enhancement along the left frontal operculum and insula, which may reflect an additional parenchymal metastasis and/or leptomeningeal metastatic disease. Numerous lesions have associated susceptibility-weighted signal loss and appear hemorrhagic. Edema surrounding multiple lesions, greatest within the anterior right frontal lobe/genu of corpus callosum, left frontal operculum and left temporal lobe. No midline shift. Chronic hemosiderin staining along the bilateral parietooccipital lobes, within the ventricular system, along the cerebellum, along  the medulla and along the visualized upper cervical spinal cord compatible with sequelae of prior subarachnoid hemorrhage. Background advanced patchy and confluent T2 FLAIR hyperintense signal abnormality within the cerebral white matter, nonspecific but likely at least partly reflecting treatment related changes. Generalized cerebellar atrophy. There is no acute infarct. Vascular: Maintained flow voids within the proximal large arterial vessels. Skull and upper cervical spine: Right frontal cranioplasty. 12 mm T2 hyperintense and enhancing lesion within the left parietal calvarium (series 6, image 37). Sinuses/Orbits: No mass or acute finding within the imaged orbits. Large mucous retention cyst occupying much of the left maxillary sinus. 11 mm mucous retention cyst, and minimal background mucosal thickening, within the right maxillary sinus. Other: Posterior scalp hematoma. IMPRESSION: 1. Innumerable lesions within the supratentorial greater than infratentorial brain consistent with metastases (measuring up to 11 mm). Gyriform focus of enhancement along the left frontal operculum and insula, which may reflect an additional parenchymal metastasis and/or leptomeningeal metastatic disease. Many lesions appear hemorrhagic. Edema surrounding multiple lesions, greatest within the anterior right frontal lobe/genu of corpus callosum, left frontal operculum and left temporal lobe. No midline shift. 2. Prior right frontal craniotomy with resection cavity in the underlying right frontal lobe. 3. Chronic blood products along the bilateral parietooccipital lobes, within the ventricular system, along the cerebellum, along the medulla and along the visualized upper cervical spinal cord compatible with sequelae of prior subarachnoid hemorrhage. 4. Background advanced cerebral white matter disease, nonspecific but likely at least partly reflecting treatment-related changes. 5. Indeterminate 12 mm enhancing lesion within the left  parietal calvarium. An osseous metastasis is a consideration. Attention recommended on imaging follow-up. 6. Paranasal sinus disease as described. Electronically Signed   By: Bascom Lily D.O.   On: 06/19/2023 11:45   CT Head Wo Contrast Addendum Date: 06/19/2023 ADDENDUM REPORT: 06/19/2023 09:40 ADDENDUM: These results were called by telephone at the time of interpretation on 06/19/2023 at 9:40 am to provider [facility] , who verbally acknowledged these results. Electronically Signed   By: Denny Flack M.D.   On: 06/19/2023 09:40   Result Date: 06/19/2023 CLINICAL DATA:  Seizure, new onset. Reported history of metastatic melanoma with intracranial lesions and history of craniotomy. EXAM: CT HEAD WITHOUT CONTRAST TECHNIQUE: Contiguous axial images were obtained from the base of the skull through the vertex without intravenous contrast. RADIATION DOSE REDUCTION: This exam was performed according to the departmental dose-optimization program which  includes automated exposure control, adjustment of the mA and/or kV according to patient size and/or use of iterative reconstruction technique. COMPARISON:  None Available. FINDINGS: Brain: There is a 1.0 x 0.7 x 0.8 cm focus of hemorrhage within the anterior inferior right frontal lobe with surrounding vasogenic edema. Underlying hemorrhagic lesion cannot be excluded. There are scattered foci of subarachnoid hemorrhage particularly along the right sylvian fissure. Additional 0.6 cm hemorrhagic focus within the anterior left temporal lobe with adjacent vasogenic edema. Similar small scattered hemorrhagic foci in the left temporal lobe (axial image 8), inferior right frontal lobe (axial image 9, along the right external capsule (axial image 12), left frontal lobe (axial image 19). Postsurgical changes of right frontal craniotomy. Underlying postsurgical changes and encephalomalacia in the right frontal lobe with multiple metallic surgical clips noted. Hypoattenuation in  the periventricular and subcortical white matter which could reflect chronic microvascular ischemic changes versus post treatment changes. Mild parenchymal volume loss. The basilar cisterns are patent. Vascular: No hyperdense vessel or unexpected calcification. Skull: Postsurgical changes.  No acute or aggressive findings. Sinuses/Orbits: Visualized orbits are unremarkable. Complete opacification of the partially visualized left maxillary sinus. Other: Mastoid air cells are clear. Soft tissue swelling in the right parietal scalp. IMPRESSION: Numerous hyperattenuating foci in the bilateral cerebral hemispheres, the largest in the anterior inferior left frontal lobe measuring up to 1.0 cm concerning for hemorrhagic intracranial lesions. Findings are compatible with reported history of metastatic melanoma. Multiple foci of vasogenic edema noted. Recommend contrast-enhanced MRI for further evaluation and consider obtaining outside prior images for comparison if available. Subarachnoid hemorrhage noted primarily in the right sylvian fissure with additional small foci along the anterior inferior frontal lobes. No midline shift. Postsurgical changes in the right frontal lobe. Electronically Signed: By: Denny Flack M.D. On: 06/19/2023 09:31   DG Chest Portable 1 View Result Date: 06/19/2023 CLINICAL DATA:  syncope EXAM: PORTABLE CHEST - 1 VIEW COMPARISON:  06/19/2016 FINDINGS: Low lung volumes.  No focal infiltrate or overt edema. Heart size and mediastinal contours are within normal limits. No effusion. Bilateral shoulder DJD.  Surgical clips at the thoracic inlet. IMPRESSION: Low volumes. No acute findings. Electronically Signed   By: Nicoletta Barrier M.D.   On: 06/19/2023 09:19    Procedures Procedures    Medications Ordered in ED Medications  acetaminophen  (TYLENOL ) tablet 650 mg (has no administration in time range)    Or  acetaminophen  (TYLENOL ) suppository 650 mg (has no administration in time range)   polyethylene glycol (MIRALAX / GLYCOLAX) packet 17 g (has no administration in time range)  amLODipine (NORVASC) tablet 10 mg (has no administration in time range)  ascorbic acid (VITAMIN C) tablet 1,000 mg (has no administration in time range)  budesonide (ENTOCORT EC) 24 hr capsule 6 mg (has no administration in time range)  calcitRIOL  (ROCALTROL ) capsule 0.25 mcg (has no administration in time range)  DULoxetine (CYMBALTA) DR capsule 30 mg (has no administration in time range)  ezetimibe (ZETIA) tablet 10 mg (has no administration in time range)  fluticasone (FLONASE) 50 MCG/ACT nasal spray 1 spray (has no administration in time range)  folic acid (FOLVITE) tablet 1 mg (has no administration in time range)  levothyroxine  (SYNTHROID ) tablet 375 mcg (has no administration in time range)  lisinopril (ZESTRIL) tablet 10 mg (has no administration in time range)  sulfaSALAzine (AZULFIDINE) tablet 1,000 mg (has no administration in time range)  zinc sulfate (50mg  elemental zinc) capsule 220 mg (has no administration in time range)  calcium  carbonate (OS-CAL - dosed in mg of elemental calcium ) tablet 2,500 mg (has no administration in time range)  levETIRAcetam (KEPPRA) tablet 500 mg (has no administration in time range)  dexamethasone (DECADRON) tablet 2 mg (has no administration in time range)  lactated ringers bolus 1,000 mL (0 mLs Intravenous Stopped 06/19/23 1120)  dexamethasone (DECADRON) injection 10 mg (10 mg Intravenous Given 06/19/23 0953)  gadobutrol (GADAVIST) 1 MMOL/ML injection 10 mL (10 mLs Intravenous Contrast Given 06/19/23 1057)  levETIRAcetam (KEPPRA) undiluted injection 1,500 mg (1,500 mg Intravenous Given 06/19/23 1120)    ED Course/ Medical Decision Making/ A&P Clinical Course as of 06/19/23 1612  Thu Jun 19, 2023  0841 Lactic Acid, Venous(!!): 2.5 Points towards seizure.  [TY]  [id number] CT Head Wo Contrast IMPRESSION: Numerous hyperattenuating foci in the bilateral  cerebral hemispheres, the largest in the anterior inferior left frontal lobe measuring up to 1.0 cm concerning for hemorrhagic intracranial lesions. Findings are compatible with reported history of metastatic melanoma. Multiple foci of vasogenic edema noted. Recommend contrast-enhanced MRI for further evaluation and consider obtaining outside prior images for comparison if available.  Subarachnoid hemorrhage noted primarily in the right sylvian fissure with additional small foci along the anterior inferior frontal lobes.  No midline shift.  Postsurgical changes in the right frontal lob   [TY]  0941 Chemo with ZIRABEV and keytruda at [facility].  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 Minimal extracranial disease. Nevertheless, preserved functional status. 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 -Phone visit with Dr Patria Bookbinder 1 week after C1 pembro-Avastin to check on how he is doing -C1 pembro-Avastin 06/13/23, C2 07/04/23 -Repeat imaging in 6 weeks to see if his melanoma starts growing once off enco-bini"  [TY]  1611 Patient's workup today with with new onset seizure in the setting of metastatic brain cancer status postcraniotomy.  Was given Decadron in the emergency department. Discussed case with neurosurgery recommending Keppra.  CT scans with multiple lesions, CT with possible subarachnoid.  Follow-up MRI appears possibly chronic in nature.  Patient to be admitted to medicine service with neurosurgery consult. [TY]     Clinical Course User Index [TY] Rolinda Climes, DO                                 Medical Decision Making 48 year old male presenting emergency department after seizure versus syncope.  EMS reported some confusion on arrival.  Does not appear to have bitten tongue or urinated on self.  Alert and oriented x 3 with no focal deficits on exam.  Per chart review does have a history of melanoma to head with mets to brain.  Will get CT head.  She is not on a blood thinner.  Possible syncope?  Will get screening labs, troponin EKG and chest x-ray.  See ED course for further MDM/disposition.  Amount and/or Complexity of Data Reviewed Labs: ordered. Decision-making details documented in ED Course. Radiology: ordered. Decision-making details documented in ED Course. ECG/medicine tests: ordered.  Risk Prescription drug management. Decision regarding hospitalization.         Final Clinical Impression(s) / ED Diagnoses Final diagnoses:  Seizure-like activity (HCC)    Rx / DC Orders ED Discharge Orders          Ordered    Ambulatory referral to Neurology       Comments: An appointment is requested in approximately: 6 wks   06/19/23 1612              Rolinda Climes, DO 06/19/23 [phone]

## 2023-06-19 NOTE — ED Notes (Signed)
 Pt currently in MRI

## 2023-06-19 NOTE — Progress Notes (Signed)
 Lactate has been fluctuating, last was 2.1. I checked on him, he's feeling better than when he first got to the hospital. He looks well. I think it's okay to stop checking the lactic acid now.

## 2023-06-19 NOTE — ED Notes (Signed)
 EEG @ bedside.

## 2023-06-19 NOTE — ED Notes (Signed)
 PLEASE UPDATE FAMILY

## 2023-06-19 NOTE — Procedures (Signed)
 Patient Name: Lee Castillo  MRN: [id number]  Epilepsy Attending: Arleene Lack  Referring Physician/Provider: Malen Scudder, DO  Date: 06/19/2023 Duration: 31.45 mins  Patient history: 48 y.o. male with past medical history of metastatic melanoma to the brain, thyroid cancer, HTN, HLD, who presented after witnessed seizure-like activity. EEG to evaluate for seizure  Level of alertness: Awake  AEDs during EEG study: LEV  Technical aspects: This EEG study was done with scalp electrodes positioned according to the 10-20 International system of electrode placement. Electrical activity was reviewed with band pass filter of 1-70Hz , sensitivity of 7 uV/mm, display speed of 51mm/sec with a 60Hz  notched filter applied as appropriate. EEG data were recorded continuously and digitally stored.  Video monitoring was available and reviewed as appropriate.  Description: The posterior dominant rhythm consists of 9 Hz activity of moderate voltage (25-35 uV) seen predominantly in posterior head regions, symmetric and reactive to eye opening and eye closing. EEG showed intermittent polymorphic sharply contoured 3 to 6 Hz theta-delta slowing in right frontal region.  Hyperventilation and photic stimulation were not performed.     ABNORMALITY - Intermittent slow, right frontal region  IMPRESSION: This study is suggestive of cortical dysfunction arising from right frontal region likely secondary to underlying structural abnormality. No seizures or epileptiform discharges were seen throughout the recording.   Agam Tuohy O Nic Lampe

## 2023-06-19 NOTE — ED Triage Notes (Signed)
 Pt here for a 90 sec seizure, pt fell and hit head. Pt has hematoma to back of head. Pt was post ictal when EMS arrived. Hx of craniotomy. No Hx of seizure. Denoies blood thinners. Axox4 Pt arrives in c-collar.
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
## 2023-06-20 ENCOUNTER — Ambulatory Visit: Admit: 2023-06-20 | Payer: BLUE CROSS/BLUE SHIELD | Attending: Hematology & Oncology | Primary: Hematology & Oncology

## 2023-06-20 ENCOUNTER — Other Ambulatory Visit (HOSPITAL_COMMUNITY): Payer: Self-pay

## 2023-06-20 DIAGNOSIS — C439 Malignant melanoma of skin, unspecified: Secondary | ICD-10-CM | POA: Diagnosis not present

## 2023-06-20 DIAGNOSIS — R569 Unspecified convulsions: Secondary | ICD-10-CM | POA: Diagnosis not present

## 2023-06-20 MED ORDER — DEXAMETHASONE 2 MG PO TABS
2.0000 mg | ORAL_TABLET | Freq: Two times a day (BID) | ORAL | 0 refills | Status: AC
  Filled 2023-06-20: qty 60, 30d supply, fill #0

## 2023-06-20 MED ORDER — LEVETIRACETAM 500 MG PO TABS
500.0000 mg | ORAL_TABLET | Freq: Two times a day (BID) | ORAL | 0 refills | Status: DC
  Filled 2023-06-20: qty 60, 30d supply, fill #0

## 2023-06-20 NOTE — Evaluation (Signed)
 Physical Therapy Evaluation Patient Details Name: Lee Castillo MRN: [id number] DOB: 08-04-1975 Today's Date: 06/20/2023  History of Present Illness  48 y.o. male presents to [facility] hospital on 06/19/2023 after witnessed seizure-like activity. PMH includes metastatic melanoma to the brain, thyroid cancer, HTN, HLD.  Clinical Impression  Pt presents to PT without significant deficits in mobility, ambulating for household distances and negotiating stairs inde[endently. Pt tolerates dynamic gait and balance challenges well, without significant loss of balance. PT does note expressive communication deficits, would recommend more formal cognitive assessment as the pt lives alone. No further acute or post-acute PT services are indicated at this time.         If plan is discharge home, recommend the following: Direct supervision/assist for financial management;Direct supervision/assist for medications management;Supervision due to cognitive status   Can travel by private vehicle        Equipment Recommendations None recommended by PT  Recommendations for Other Services       Functional Status Assessment Patient has not had a recent decline in their functional status (appears to be at mobility baseline)     Precautions / Restrictions Precautions Precautions: Other (comment) (seizure) Recall of Precautions/Restrictions: Intact Restrictions Weight Bearing Restrictions Per Provider Order: No      Mobility  Bed Mobility Overal bed mobility: Modified Independent                  Transfers Overall transfer level: Independent Equipment used: None                    Ambulation/Gait Ambulation/Gait assistance: Modified independent (Device/Increase time) Gait Distance (Feet): 250 Feet Assistive device: None Gait Pattern/deviations: WFL(Within Functional Limits) Gait velocity: functional     General Gait Details: pt tolerates dynamic gait challenges including head turns,  changes in gaitspeed and stride length, quick stoppages of gait, backward walking, all without loss of balance  Stairs Stairs: Yes Stairs assistance: Modified independent (Device/Increase time) Stair Management: One rail Left, Forwards Number of Stairs: 2 General stair comments: pt negotiates 2 steps twice, initially with both railings and later with only the left railing  Wheelchair Mobility     Tilt Bed    Modified Rankin (Stroke Patients Only)       Balance Overall balance assessment: Needs assistance Sitting-balance support: No upper extremity supported, Feet supported Sitting balance-Leahy Scale: Good     Standing balance support: No upper extremity supported Standing balance-Leahy Scale: Good             Rhomberg - Eyes Closed: 30   High Level Balance Comments: pt retrieves a pen from the floor by squatting, later stands on LLE for 6 seconds, RLE SLS only for 3 seconds             Pertinent Vitals/Pain Pain Assessment Pain Assessment: No/denies pain    Home Living Family/patient expects to be discharged to:: Private residence Living Arrangements: Alone Available Help at Discharge: Family;Available PRN/intermittently (sister) Type of Home: House Home Access: Stairs to enter Entrance Stairs-Rails: Left Entrance Stairs-Number of Steps: 4   Home Layout: One level Home Equipment: None Additional Comments: pt reports he previously had a cane but returned it because it was too short    Prior Function Prior Level of Function : Independent/Modified Independent             Mobility Comments: pt reports independence in mobility ADLs Comments: pt had been residing in a hotel recently,  bathing/dressing himself, was not cooking  Extremity/Trunk Assessment   Upper Extremity Assessment Upper Extremity Assessment: Overall WFL for tasks assessed    Lower Extremity Assessment Lower Extremity Assessment: Overall WFL for tasks assessed    Cervical  / Trunk Assessment Cervical / Trunk Assessment: Normal  Communication   Communication Communication: No apparent difficulties    Cognition Arousal: Alert Behavior During Therapy: WFL for tasks assessed/performed   PT - Cognitive impairments: History of cognitive impairments, Difficult to assess Difficult to assess due to: Impaired communication                     PT - Cognition Comments: expressive deficits Following commands: Intact       Cueing Cueing Techniques: Verbal cues     General Comments General comments (skin integrity, edema, etc.): VSS on RA    Exercises     Assessment/Plan    PT Assessment Patient does not need any further PT services  PT Problem List         PT Treatment Interventions      PT Goals (Current goals can be found in the Care Plan section)       Frequency       Co-evaluation               AM-PAC PT "6 Clicks" Mobility  Outcome Measure Help needed turning from your back to your side while in a flat bed without using bedrails?: None Help needed moving from lying on your back to sitting on the side of a flat bed without using bedrails?: None Help needed moving to and from a bed to a chair (including a wheelchair)?: None Help needed standing up from a chair using your arms (e.g., wheelchair or bedside chair)?: None Help needed to walk in hospital room?: None Help needed climbing 3-5 steps with a railing? : None 6 Click Score: 24    End of Session Equipment Utilized During Treatment: Gait belt Activity Tolerance: Patient tolerated treatment well Patient left: in chair;with call bell/phone within reach Nurse Communication: Mobility status PT Visit Diagnosis: Other symptoms and signs involving the nervous system (R29.898)    Time: 1610-9604 PT Time Calculation (min) (ACUTE ONLY): 18 min   Charges:   PT Evaluation $PT Eval Low Complexity: 1 Low   PT General Charges $$ ACUTE PT VISIT: 1 Visit         Rexie Catena, PT, DPT Acute Rehabilitation Office [phone]   Rexie Catena 06/20/2023, 10:47 AM

## 2023-06-20 NOTE — Progress Notes (Signed)
 Physical Therapy Quick Note  PT has completed initial evaluation.    Overall, patient at mod I assistance level.   PT Follow up recommended: No follow up PT Equipment recommended:  None recommended Complete evaluation note to follow.     Arlyss Gandy, PT, DPT Acute Rehabilitation Office [phone]

## 2023-06-20 NOTE — Evaluation (Signed)
 Occupational Therapy Evaluation and DC Summary  Patient Details Name: Lee Castillo MRN: [id number] DOB: 12-Apr-1975 Today's Date: 06/20/2023   History of Present Illness   48 y.o. male presents to [facility] hospital on 06/19/2023 after witnessed seizure-like activity. PMH includes metastatic melanoma to the brain, thyroid cancer, HTN, HLD.     Clinical Impressions Pt admitted for above, PTA pt reports being ind with ADLs/iADLs including medication management, normally his sister provides transportation services to/from and assists with getting groceries but has recent back complications. Pt currently functioning close to functional baseline, ambulating in hall independently and making use of problem solving skills to perform tasks. He complete ADLs ind throughout the session. He does demonstrate decreased STM but overall cognitive functioning intact with executive function and routine activities. Pt has no further acute skilled OT needs, no post acute OT needs identified.      If plan is discharge home, recommend the following:   Assist for transportation     Functional Status Assessment   Patient has not had a recent decline in their functional status     Equipment Recommendations   None recommended by OT     Recommendations for Other Services         Precautions/Restrictions   Precautions Precautions: Other (comment) Recall of Precautions/Restrictions: Intact Precaution/Restrictions Comments: seizure Restrictions Weight Bearing Restrictions Per Provider Order: No     Mobility Bed Mobility Overal bed mobility: Modified Independent                  Transfers Overall transfer level: Independent Equipment used: None                      Balance Overall balance assessment: Needs assistance Sitting-balance support: No upper extremity supported, Feet supported Sitting balance-Leahy Scale: Good     Standing balance support: No upper extremity  supported Standing balance-Leahy Scale: Good                             ADL either performed or assessed with clinical judgement   ADL Overall ADL's : Independent                                       General ADL Comments: Pt ambulating hall, completeing dressing and standing grooming routine at sink without challenge. Took pt down to ground level and had him navigate the way to the cafeteria and grab a dessert, pt utilizing signs to help navigation but needed 1 verbal cue for orientation of our current location on the map. He was able to locate the item requested from the desert bar and was able to recall orientation back to room on 3rd floor from the cafeteria.     Vision   Vision Assessment?: No apparent visual deficits     Perception         Praxis         Pertinent Vitals/Pain Pain Assessment Pain Assessment: No/denies pain     Extremity/Trunk Assessment Upper Extremity Assessment Upper Extremity Assessment: Overall WFL for tasks assessed   Lower Extremity Assessment Lower Extremity Assessment: Overall WFL for tasks assessed   Cervical / Trunk Assessment Cervical / Trunk Assessment: Normal   Communication Communication Communication: No apparent difficulties   Cognition Arousal: Alert Behavior During Therapy: WFL for tasks assessed/performed Cognition: Cognition impaired  Memory impairment (select all impairments): Short-term memory     OT - Cognition Comments: Administered Medi cog assessment. Pt scored 1/3 on word recall during mini cog assessment, demonstrating some mild STM deficits but pt functioning enough overall during routine tasks. Pt able to recall 2/2 ADLs following hall level ambulation that he was prompted to complete upon return to room. Pt scored 7/10 throughout medi cog, missed one question on the medication management section regarding the calculation of total pills on Saturday- he seemed to have miscounted  the # of pills total as all his other answers were correct on the other portions. Pt actually used symbols to distinguish which answers related to a particular question which was well organized but may have lead to his miscalculations.                 Following commands: Intact       Cueing  General Comments   Cueing Techniques: Verbal cues  VSS, had thourough discussion surrounding pt's current medication routine   Exercises     Shoulder Instructions      Home Living Family/patient expects to be discharged to:: Private residence Living Arrangements: Alone Available Help at Discharge: Family;Available PRN/intermittently (sister) Type of Home: House Home Access: Stairs to enter [employer] of Steps: 4 Entrance Stairs-Rails: Left Home Layout: One level     Bathroom Shower/Tub: Walk-in shower;Tub/shower unit   Bathroom Toilet: Standard     Home Equipment: None   Additional Comments: Reports his sister has back issues, he is uncertain how much driving she will be doing. Pt reports that he has enough support around him to continue getting groceries.      Prior Functioning/Environment Prior Level of Function : Independent/Modified Independent             Mobility Comments: pt reports independence in mobility ADLs Comments: pt had been residing in a hotel recently,  bathing/dressing himself, was not cooking. Has not had to prep meals recently since staying at facility that provided meals.    OT Problem List: Other (comment) (n/a)   OT Treatment/Interventions:        OT Goals(Current goals can be found in the care plan section)   Acute Rehab OT Goals Patient Stated Goal: go home OT Goal Formulation: With patient Time For Goal Achievement: 07/04/23 Potential to Achieve Goals: Good   OT Frequency:       Co-evaluation              AM-PAC OT "6 Clicks" Daily Activity     Outcome Measure Help from another person eating meals?:  None Help from another person taking care of personal grooming?: None Help from another person toileting, which includes using toliet, bedpan, or urinal?: None Help from another person bathing (including washing, rinsing, drying)?: None Help from another person to put on and taking off regular upper body clothing?: None Help from another person to put on and taking off regular lower body clothing?: None 6 Click Score: 24   End of Session Nurse Communication: Mobility status  Activity Tolerance: Patient tolerated treatment well Patient left: in bed;with call bell/phone within reach  OT Visit Diagnosis: Other symptoms and signs involving cognitive function                Time: 1113-1140 OT Time Calculation (min): 27 min Charges:  OT General Charges $OT Visit: 1 Visit OT Evaluation $OT Eval Low Complexity: 1 Low OT Treatments $Cognitive Funtion inital: Initial 15 mins  06/20/2023  AB, OTR/L  Acute Rehabilitation Services  Office: [phone]   Jorene New 06/20/2023, 12:44 PM

## 2023-06-20 NOTE — Plan of Care (Signed)

## 2023-06-20 NOTE — TOC Transition Note (Signed)
 Transition of Care [facility]) - Discharge Note   Patient Details  Name: Lee Castillo MRN: [id number] Date of Birth: [birth year]  [facility] CM/SW Contact:  Jonathan Neighbor, RN Phone Number: 06/20/2023, 11:06 AM   Clinical Narrative:     Pt is from home alone. He states he has friends/ cousin/ sister that can check on him. Friends provide needed transportation. He manages his own medications without any issues.  DME at home: shower seat Pt has PCP in [place] through [facility].  No follow up per PT.  He will d/c home with self care. Pt will work on transport home.   Final next level of care: Home/Self Care Barriers to Discharge: No Barriers Identified   Patient Goals and CMS Choice            Discharge Placement                       Discharge Plan and Services Additional resources added to the After Visit Summary for                                       Social Drivers of Health (SDOH) Interventions SDOH Screenings   Food Insecurity: No Food Insecurity (06/20/2023)  Housing: Low Risk  (06/20/2023)  Transportation Needs: No Transportation Needs (06/20/2023)  Utilities: Not At Risk (06/20/2023)  Financial Resource Strain: Low Risk  (03/25/2023)   Received from [facility]  Physical Activity: Inactive (11/05/2021)   Received from [facility]  Social Connections: Moderately Isolated (11/05/2021)   Received from [facility]  Stress: Stress Concern Present (11/05/2021)   Received from [facility]  Tobacco Use: Low Risk  (06/19/2023)  Health Literacy: Low Risk  (11/05/2021)   Received from [facility]     Readmission Risk Interventions     No data to display

## 2023-06-20 NOTE — Discharge Instructions (Addendum)
 Lee Castillo,  Lee Castillo came to the hospital for seizure.  Please start the following medications for seizures: -Keppra 500 mg, take 1 tab by mouth 2 times daily - Please make sure you follow-up with the neurologist once he get discharged from the hospital  For your cancer - Please take Decadron 2 mg, take 1 tab by mouth 2 times daily - You will have a video visit with your oncologist on Tuesday, he will discuss with you about your cancer treatment medications.  Otherwise continue taking the rest of her medications as prescribed.   DO NOT DRIVE!!!!!!!!!  If you have any of these following symptoms, please call us  or seek care at an emergency department: -Chest Pain -Difficulty Breathing -Worsening abdominal pain -Syncope (passing out) -Drooping of face -Slurred speech -Sudden weakness in your leg or arm -Fever -Chills -blood in the stool -dark black, sticky stool  If you have any questions or concerns, call our clinic at [phone], [phone] or after hours call [phone] and ask for the internal medicine resident on call.  I am glad you are feeling better. It was a pleasure taking care for you. I wish a good recovery and good health!   Dr. Lanney Pitts
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
## 2023-06-20 NOTE — Discharge Summary (Signed)
 Name: Lee Castillo MRN: [id number] DOB: [birth year] 48 y.o. PCP: Patient, No Pcp Per  Date of Admission: 06/19/2023  8:10 AM Date of Discharge:  06/20/2023  Attending Physician: Dr. Jarvis Mesa  DISCHARGE DIAGNOSIS:  Primary Problem: Seizures [facility])   Hospital Problems: Principal Problem:   Seizures (HCC) Active Problems:   Metastatic melanoma (HCC)    DISCHARGE MEDICATIONS:   Allergies as of 06/20/2023       Reactions   Coconut Flavoring Agent (non-screening) Nausea Only   coconut   Compazine [prochlorperazine] Rash        Medication List     PAUSE taking these medications    Braftovi 75 MG capsule Wait to take this until your doctor or other care provider tells you to start again. Generic drug: encorafenib Take by mouth.   [name] 15 MG tablet Wait to take this until your doctor or other care provider tells you to start again. Generic drug: binimetinib Take by mouth.       TAKE these medications    acetaminophen  325 MG tablet Commonly known as: TYLENOL  Take 650 mg by mouth every 6 (six) hours as needed for mild pain (pain score 1-3), moderate pain (pain score 4-6) or headache.   amLODipine 5 MG tablet Commonly known as: NORVASC Take 10 mg by mouth daily.   ascorbic acid 500 MG tablet Commonly known as: VITAMIN C Take 1,000 mg by mouth daily.   budesonide 3 MG 24 hr capsule Commonly known as: ENTOCORT EC Take 6 mg by mouth daily.   calcitRIOL  0.25 MCG capsule Commonly known as: ROCALTROL  For bone health.  Resume after cleared by [facility]. What changed:  how much to take how to take this when to take this additional instructions   calcium  carbonate 1250 (500 Ca) MG chewable tablet Commonly known as: OS-CAL REsume when taking a regular diet/per [facility] recommendations What changed:  how much to take when to take this additional instructions   dexamethasone 2 MG tablet Commonly known as: DECADRON Take 1 tablet (2 mg total) by mouth every 12 (twelve)  hours. What changed:  medication strength how much to take when to take this   DULoxetine 30 MG capsule Commonly known as: CYMBALTA Take 30 mg by mouth 2 (two) times daily.   ezetimibe 10 MG tablet Commonly known as: ZETIA Take 10 mg by mouth daily.   fexofenadine 180 MG tablet Commonly known as: ALLEGRA Take 180 mg by mouth every morning.   fluticasone 50 MCG/ACT nasal spray Commonly known as: FLONASE Place 1 spray into both nostrils daily.   folic acid 1 MG tablet Commonly known as: FOLVITE Take 1 mg by mouth daily.   levETIRAcetam 500 MG tablet Commonly known as: KEPPRA Take 1 tablet (500 mg total) by mouth 2 (two) times daily.   levothyroxine  125 MCG tablet Commonly known as: SYNTHROID  Resume per instructions per [facility].  For low thyroid function What changed:  how much to take how to take this when to take this additional instructions   lisinopril 10 MG tablet Commonly known as: ZESTRIL Take 10 mg by mouth daily.   multivitamin with minerals Tabs tablet Take 1 tablet by mouth daily.   oxyCODONE 5 MG immediate release tablet Commonly known as: Oxy IR/ROXICODONE Take 5 mg by mouth every 4 (four) hours as needed.   sulfaSALAzine 500 MG tablet Commonly known as: AZULFIDINE Take 1,000 mg by mouth 2 (two) times daily.   zinc sulfate (50mg  elemental zinc) 220 (50 Zn) MG  capsule Take 220 mg by mouth daily.        DISPOSITION AND FOLLOW-UP:  Lee Castillo was discharged from [facility] in Good condition. At the hospital follow up visit please address:  Seizure: Patient discharged with Keppra 500 mg twice daily for 30 days  Please make sure patient follows up with neurology in the outpatient setting Patient is given strict restrictions for driving, paperwork are sent to the [facility] per neurology. 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 He does not recall preceding pre-syncope prior to event that led to ED presentation. Nursing documented additional episode while in ED. MRI brain showed interval lesions within the supratentorial greater than infratentorial brain consistent with mental status (measuring up to 11 mm). Gyriform focus of enhancement along the left frontal operculum and insula, which may reflect an additional parenchymal metastasis and/or leptomeningeal metastatic disease. Many lesions appear hemorrhagic. Edema surrounding multiple lesions, greatest within the anterior right frontal lobe/genu of corpus callosum, left frontal operculum and left temporal lobe. No midline shift. Indeterminate 12 mm enhancing lesion within the left parietal calvarium. An osseous metastasis is a consideration.  Neurology, neurosurgery, our team reached out to patient's [facility] oncologist.  Patient was started on Keppra 5 mg twice daily and started on Decadron 2 mg twice daily.  Neurosurgery evaluated the patient, did not recommend any emergent interventions or procedure. EEG did not capture acute seizure like activity, note cortical dysfunction arising from right frontal region likely secondary to underlying structural abnormality.  Per evaluation this morning patient is alert and oriented x 3, no aphasia noted.  Denies any chest pain shortness breath.  Denies abdominal pain.  Reports that he is doing well this  morning. Spoke with Tammy (patient's sister) who expressed concerns about patient's driving restrictions.  Per neurology patient is restricted for driving, and CDME request for driver reexamination paperwork is faxed over to [employer] per neurology.  Contacted neurosurgery and neurology, patient is cleared for discharge.   Elevated lactic acid NAGMA Very mild NAGMA. Lactic acid elevation suspected to be 2/2 suspected seizures.  Lactic acid 2.7 (3.7).   Normocytic anemia Review of [facility] records show normal Hgb 1 week ago and it appears that his hemoglobin level fluctuates; current value is within his normal range. No overt signs of bleeding.  Secondary hypothyroidism s/p thyroidectomy  History of thyroid cancer Reports that he still is undergoing active treatment and surveillance of cancer. He is on levothyroxine  375 mcg daily as outpatient. Plan: -Continue levothyroxine  375 mcg daily   Depression Peripheral neuropathy Plan: -Continue home duloxetine 30 mg BID   HTN Plan: -Resume home amlodipine 10 mg daily, lisinopril 10 mg daily   HLD Plan: -Continue home zetia 10 mg daily   Ankylosing spondylitis Plan: -Continue home budesonide 6 mg daily, sulfasalazine 1000 mg BID   Nutritional supplements Plan: -Continue home ascorbic acid 1000 mg daily, calcitriol  0.25 mcg daily, calcium  carbonate 2500 mg BID, folic acid  1 mg daily, zinc 220 mg daily  DISCHARGE INSTRUCTIONS:   Discharge Instructions     Ambulatory referral to Neurology   Complete by: As directed    An appointment is requested in approximately: 6 wks   Diet - low sodium heart healthy   Complete by: As directed    Discharge instructions   Complete by: As directed    Lee Castillo, Lee Castillo came to the hospital for seizure.  Please start the following medications for seizures: -Keppra 500 mg, take 1 tab by mouth 2 times daily - Please make sure you follow-up with the neurologist once he get discharged from the  hospital  For your cancer - Please take Decadron 2 mg, take 1 tab by mouth 2 times daily - You will have a video visit with your oncologist on Tuesday, he will discuss with you about your cancer treatment medications.  Otherwise continue taking the rest of her medications as prescribed.   DO NOT DRIVE!!!!!!!!!  If you have any of these following symptoms, please call us  or seek care at an emergency department: -Chest Pain -Difficulty Breathing -Worsening abdominal pain -Syncope (passing out) -Drooping of face -Slurred speech -Sudden weakness in your leg or arm -Fever -Chills -blood in the stool -dark black, sticky stool  If you have any questions or concerns, call our clinic at [phone], [phone] or after hours call [phone] and ask for the internal medicine resident on call.  I am glad you are feeling better. It was a pleasure taking care for you. I wish a good recovery and good health!   Dr. Lanney Pitts   Increase activity slowly   Complete by: As directed        SUBJECTIVE:  Patient is eval bedside, laying in bed, no acute distress.  Patient denies any chest pain or shortness of breath.  Denies any headaches or vision changes.  Denies any other acute episode of seizures.  Denies any dizzy spells or lightheadedness.  No other concerns this morning.  Reports that he wants us  to speak to Tammy in terms of disposition planning.    Discharge Vitals:   BP (!) 150/95 (BP Location: Left Arm)   Pulse 88   Temp 97.7 F (36.5 C) (Oral)   Resp 16   Ht 6\' 2"  (1.88 m)   Wt 136.1 kg   SpO2 99%   BMI 38.52 kg/m   OBJECTIVE:  Physical Exam   General: Laying in bed, no acute distress Cardiovascular: Regular rate Pulmonary: Breathing comfortably, no wheezing or crackles Abdomen: Soft, nontender, nondistended Skin: No lower extremity edema  Pertinent Labs, Studies, and Procedures:     Latest Ref Rng & Units 06/19/2023    8:24 AM 06/20/2016    2:00 AM 06/19/2016     9:38 PM  CBC  WBC 4.0 - 10.5 K/uL 7.4  16.8    Hemoglobin 13.0 - 17.0 g/dL 52.8  41.3  24.4   Hematocrit 39.0 - 52.0 % 39.3  45.3  53.0   Platelets 150 - 400 K/uL 273  253         Latest Ref Rng & Units 06/19/2023    8:24 AM 06/20/2016    2:00 AM 06/19/2016    9:38 PM  CMP  Glucose 70 - 99 mg/dL 010  272  536   BUN 6 - 20 mg/dL 13  18  21    Creatinine 0.61 - 1.24 mg/dL 6.44  0.34  7.42   Sodium 135 - 145 mmol/L  139  140  139   Potassium 3.5 - 5.1 mmol/L 4.2  3.8  3.8   Chloride 98 - 111 mmol/L 105  106  106   CO2 22 - 32 mmol/L 21  20    Calcium  8.9 - 10.3 mg/dL 7.8  7.1    Total Protein 6.5 - 8.1 g/dL  6.9    Total Bilirubin 0.3 - 1.2 mg/dL  0.9    Alkaline Phos 38 - 126 U/L  54    AST 15 - 41 U/L  40    ALT 17 - 63 U/L  35      EEG adult Result Date: 06/19/2023 Arleene Lack, MD     06/19/2023  5:14 PM Patient Name: MERRIL NAGY MRN: [id number] Epilepsy Attending: Arleene Lack Referring Physician/Provider: Malen Scudder, DO Date: 06/19/2023 Duration: 31.45 mins Patient history: 48 y.o. male with past medical history of metastatic melanoma to the brain, thyroid cancer, HTN, HLD, who presented after witnessed seizure-like activity. EEG to evaluate for seizure Level of alertness: Awake AEDs during EEG study: LEV Technical aspects: This EEG study was done with scalp electrodes positioned according to the 10-20 International system of electrode placement. Electrical activity was reviewed with band pass filter of 1-70Hz , sensitivity of 7 uV/mm, display speed of 45mm/sec with a 60Hz  notched filter applied as appropriate. EEG data were recorded continuously and digitally stored.  Video monitoring was available and reviewed as appropriate. Description: The posterior dominant rhythm consists of 9 Hz activity of moderate voltage (25-35 uV) seen predominantly in posterior head regions, symmetric and reactive to eye opening and eye closing. EEG showed intermittent polymorphic sharply contoured 3  to 6 Hz theta-delta slowing in right frontal region.  Hyperventilation and photic stimulation were not performed.   ABNORMALITY - Intermittent slow, right frontal region IMPRESSION: This study is suggestive of cortical dysfunction arising from right frontal region likely secondary to underlying structural abnormality. No seizures or epileptiform discharges were seen throughout the recording. Arleene Lack   MR Brain W and Wo Contrast Result Date: 06/19/2023 CLINICAL DATA:  Provided history: Metastatic disease evaluation. Additional history obtained from electronic MEDICAL RECORD NUMBERHistory of melanoma and thyroid cancer. EXAM: MRI HEAD WITHOUT AND WITH CONTRAST TECHNIQUE: Multiplanar, multiecho pulse sequences of the brain and surrounding structures were obtained without and with intravenous contrast. CONTRAST:  10mL GADAVIST GADOBUTROL 1 MMOL/ML IV SOLN COMPARISON:  Head CT performed earlier today 06/19/2023. FINDINGS: Brain: No age-advanced or lobar predominant cerebral atrophy. Postoperative changes from prior right frontal craniotomy. Focus of encephalomalacia/gliosis in the underlying right frontal lobe. Associated surgical clips and chronic blood products at this site. There are innumerable lesions scattered within the supratentorial greater than infratentorial brain (measuring up to 11 mm), compatible with metastases. The majority of these lesions demonstrated precontrast T1 hyperintense signal which limits evaluation for post-contrast enhancement. However some lesions show definite enhancement. Gyriform focus of enhancement along the left frontal operculum and insula, which may reflect an additional parenchymal metastasis and/or leptomeningeal metastatic disease. Numerous lesions have associated susceptibility-weighted signal loss and appear hemorrhagic. Edema surrounding multiple lesions, greatest within the anterior right frontal lobe/genu of corpus callosum, left frontal operculum and left temporal  lobe. No midline shift. Chronic hemosiderin staining along the bilateral parietooccipital lobes, within the ventricular system, along the cerebellum, along the medulla and along the visualized upper cervical spinal cord compatible with sequelae of prior subarachnoid hemorrhage. Background advanced patchy and confluent T2 FLAIR hyperintense signal abnormality within the cerebral white  matter, nonspecific but likely at least partly reflecting treatment related changes. Generalized cerebellar atrophy. There is no acute infarct. Vascular: Maintained flow voids within the proximal large arterial vessels. Skull and upper cervical spine: Right frontal cranioplasty. 12 mm T2 hyperintense and enhancing lesion within the left parietal calvarium (series 6, image 37). Sinuses/Orbits: No mass or acute finding within the imaged orbits. Large mucous retention cyst occupying much of the left maxillary sinus. 11 mm mucous retention cyst, and minimal background mucosal thickening, within the right maxillary sinus. Other: Posterior scalp hematoma. IMPRESSION: 1. Innumerable lesions within the supratentorial greater than infratentorial brain consistent with metastases (measuring up to 11 mm). Gyriform focus of enhancement along the left frontal operculum and insula, which may reflect an additional parenchymal metastasis and/or leptomeningeal metastatic disease. Many lesions appear hemorrhagic. Edema surrounding multiple lesions, greatest within the anterior right frontal lobe/genu of corpus callosum, left frontal operculum and left temporal lobe. No midline shift. 2. Prior right frontal craniotomy with resection cavity in the underlying right frontal lobe. 3. Chronic blood products along the bilateral parietooccipital lobes, within the ventricular system, along the cerebellum, along the medulla and along the visualized upper cervical spinal cord compatible with sequelae of prior subarachnoid hemorrhage. 4. Background advanced cerebral  white matter disease, nonspecific but likely at least partly reflecting treatment-related changes. 5. Indeterminate 12 mm enhancing lesion within the left parietal calvarium. An osseous metastasis is a consideration. Attention recommended on imaging follow-up. 6. Paranasal sinus disease as described. Electronically Signed   By: Bascom Lily D.O.   On: 06/19/2023 11:45   CT Head Wo Contrast Addendum Date: 06/19/2023 ADDENDUM REPORT: 06/19/2023 09:40 ADDENDUM: These results were called by telephone at the time of interpretation on 06/19/2023 at 9:40 am to provider [facility] , who verbally acknowledged these results. Electronically Signed   By: Denny Flack M.D.   On: 06/19/2023 09:40   Result Date: 06/19/2023 CLINICAL DATA:  Seizure, new onset. Reported history of metastatic melanoma with intracranial lesions and history of craniotomy. EXAM: CT HEAD WITHOUT CONTRAST TECHNIQUE: Contiguous axial images were obtained from the base of the skull through the vertex without intravenous contrast. RADIATION DOSE REDUCTION: This exam was performed according to the departmental dose-optimization program which includes automated exposure control, adjustment of the mA and/or kV according to patient size and/or use of iterative reconstruction technique. COMPARISON:  None Available. FINDINGS: Brain: There is a 1.0 x 0.7 x 0.8 cm focus of hemorrhage within the anterior inferior right frontal lobe with surrounding vasogenic edema. Underlying hemorrhagic lesion cannot be excluded. There are scattered foci of subarachnoid hemorrhage particularly along the right sylvian fissure. Additional 0.6 cm hemorrhagic focus within the anterior left temporal lobe with adjacent vasogenic edema. Similar small scattered hemorrhagic foci in the left temporal lobe (axial image 8), inferior right frontal lobe (axial image 9, along the right external capsule (axial image 12), left frontal lobe (axial image 19). Postsurgical changes of right frontal  craniotomy. Underlying postsurgical changes and encephalomalacia in the right frontal lobe with multiple metallic surgical clips noted. Hypoattenuation in the periventricular and subcortical white matter which could reflect chronic microvascular ischemic changes versus post treatment changes. Mild parenchymal volume loss. The basilar cisterns are patent. Vascular: No hyperdense vessel or unexpected calcification. Skull: Postsurgical changes.  No acute or aggressive findings. Sinuses/Orbits: Visualized orbits are unremarkable. Complete opacification of the partially visualized left maxillary sinus. Other: Mastoid air cells are clear. Soft tissue swelling in the right parietal scalp. IMPRESSION: Numerous hyperattenuating foci in  the bilateral cerebral hemispheres, the largest in the anterior inferior left frontal lobe measuring up to 1.0 cm concerning for hemorrhagic intracranial lesions. Findings are compatible with reported history of metastatic melanoma. Multiple foci of vasogenic edema noted. Recommend contrast-enhanced MRI for further evaluation and consider obtaining outside prior images for comparison if available. Subarachnoid hemorrhage noted primarily in the right sylvian fissure with additional small foci along the anterior inferior frontal lobes. No midline shift. Postsurgical changes in the right frontal lobe. Electronically Signed: By: Denny Flack M.D. On: 06/19/2023 09:31   DG Chest Portable 1 View Result Date: 06/19/2023 CLINICAL DATA:  syncope EXAM: PORTABLE CHEST - 1 VIEW COMPARISON:  06/19/2016 FINDINGS: Low lung volumes.  No focal infiltrate or overt edema. Heart size and mediastinal contours are within normal limits. No effusion. Bilateral shoulder DJD.  Surgical clips at the thoracic inlet. IMPRESSION: Low volumes. No acute findings. Electronically Signed   By: Nicoletta Barrier M.D.   On: 06/19/2023 09:19     Signed: Lanney Pitts, D.O.  Internal Medicine Resident, PGY-1 [facility]  Pager: [phone] 2:46 PM, 06/20/2023

## 2023-06-20 NOTE — Progress Notes (Signed)
 Delane Fear to be discharged home with sister per MD order. Discussed with the patient and all questions fully answered. TOC medications delivered to patient. Skin clean, dry, and intact without evidence of skin break down. IV catheter discontinued intact. Site without signs and symptoms of complications. Dressing and pressure applied.  An After Visit Summary was printed and given to the patient.  Patient escorted via [facility], and discharged home via private auto.  Marionette Sick  06/20/2023

## 2023-06-24 ENCOUNTER — Ambulatory Visit
Admit: 2023-06-24 | Discharge: 2023-06-25 | Payer: BLUE CROSS/BLUE SHIELD | Attending: Hematology & Oncology | Primary: Hematology & Oncology

## 2023-06-24 NOTE — Unmapped (Unsigned)
 Phone with Murl Armor.  Significant decline after he had a seizure.   Signed to a group home. He was supposed to go back to his own home.  Patient is very angry and argumentative. Not processing things. Struggle with word finding.   Wants to get keys.     Doing OK. Has headaches here and there. Headaches are better.   Hit head when had breakfast at the hotel.  No vision changes.  Feels slower. More tired. Sleeps more. He is at home. [place].  He is cooking right now. But his brother will come tomorrow.   Brother 20 min from sister. 3 hours from brother/.  20 minutes from sister.  Somebody will stay with him tomorrow.  Decadron 2mg  bid.  No braftovi-mektovi.

## 2023-06-26 ENCOUNTER — Ambulatory Visit: Admit: 2023-06-26 | Payer: BLUE CROSS/BLUE SHIELD

## 2023-06-27 NOTE — Unmapped (Signed)
 Specialty Medication(s): Braftovi & [name]    Kenneth Fitzgerald has been dis-enrolled from the [facility] specialty pharmacy services as a result of a change in therapy. The patient is now taking IV infusion and is not filling at the [facility].    Additional information provided to the patient:     Kenneth Fitzgerald [name], PharmD  [facility] Specialty Pharmacist

## 2023-07-02 ENCOUNTER — Inpatient Hospital Stay: Admit: 2023-07-02 | Discharge: 2023-07-02 | Payer: BLUE CROSS/BLUE SHIELD

## 2023-07-02 ENCOUNTER — Inpatient Hospital Stay
Admit: 2023-07-02 | Discharge: 2023-07-03 | Payer: BLUE CROSS/BLUE SHIELD | Attending: Radiation Oncology | Primary: Radiation Oncology

## 2023-07-02 DIAGNOSIS — C7931 Secondary malignant neoplasm of brain: Principal | ICD-10-CM

## 2023-07-02 DIAGNOSIS — C439 Malignant melanoma of skin, unspecified: Principal | ICD-10-CM

## 2023-07-02 DIAGNOSIS — C434 Malignant melanoma of scalp and neck: Principal | ICD-10-CM

## 2023-07-03 DIAGNOSIS — C7931 Secondary malignant neoplasm of brain: Principal | ICD-10-CM

## 2023-07-03 DIAGNOSIS — C434 Malignant melanoma of scalp and neck: Principal | ICD-10-CM

## 2023-07-03 DIAGNOSIS — C439 Malignant melanoma of skin, unspecified: Principal | ICD-10-CM

## 2023-07-03 DIAGNOSIS — E782 Mixed hyperlipidemia: Principal | ICD-10-CM

## 2023-07-03 MED ORDER — FOLIC ACID 1 MG TABLET
ORAL_TABLET | Freq: Every day | ORAL | 3 refills | 0.00000 days
Start: 2023-07-03 — End: ?

## 2023-07-03 MED ORDER — EZETIMIBE 10 MG TABLET
ORAL_TABLET | Freq: Every day | ORAL | 1 refills | 66.00000 days
Start: 2023-07-03 — End: ?

## 2023-07-04 ENCOUNTER — Other Ambulatory Visit: Admit: 2023-07-04 | Discharge: 2023-07-05 | Payer: BLUE CROSS/BLUE SHIELD

## 2023-07-04 ENCOUNTER — Ambulatory Visit
Admit: 2023-07-04 | Discharge: 2023-07-05 | Payer: BLUE CROSS/BLUE SHIELD | Attending: Hematology & Oncology | Primary: Hematology & Oncology

## 2023-07-04 ENCOUNTER — Inpatient Hospital Stay: Admit: 2023-07-04 | Discharge: 2023-07-05 | Payer: BLUE CROSS/BLUE SHIELD

## 2023-07-04 DIAGNOSIS — C7931 Secondary malignant neoplasm of brain: Principal | ICD-10-CM

## 2023-07-04 DIAGNOSIS — C439 Malignant melanoma of skin, unspecified: Principal | ICD-10-CM

## 2023-07-04 DIAGNOSIS — C434 Malignant melanoma of scalp and neck: Principal | ICD-10-CM

## 2023-07-04 MED ORDER — FOLIC ACID 1 MG TABLET
ORAL_TABLET | Freq: Every day | ORAL | 3 refills | 90.00000 days | Status: CP
Start: 2023-07-04 — End: ?

## 2023-07-04 MED ORDER — LEVETIRACETAM 500 MG TABLET
ORAL_TABLET | Freq: Two times a day (BID) | ORAL | 3 refills | 90.00000 days | Status: CP
Start: 2023-07-04 — End: ?

## 2023-07-04 MED ORDER — EZETIMIBE 10 MG TABLET
ORAL_TABLET | Freq: Every day | ORAL | 3 refills | 90.00000 days | Status: CP
Start: 2023-07-04 — End: 2024-07-03

## 2023-07-05 DIAGNOSIS — C7931 Secondary malignant neoplasm of brain: Principal | ICD-10-CM

## 2023-07-05 DIAGNOSIS — C439 Malignant melanoma of skin, unspecified: Principal | ICD-10-CM

## 2023-07-05 DIAGNOSIS — C434 Malignant melanoma of scalp and neck: Principal | ICD-10-CM

## 2023-07-08 DIAGNOSIS — C7931 Secondary malignant neoplasm of brain: Principal | ICD-10-CM

## 2023-07-08 DIAGNOSIS — C434 Malignant melanoma of scalp and neck: Principal | ICD-10-CM

## 2023-07-08 DIAGNOSIS — C439 Malignant melanoma of skin, unspecified: Principal | ICD-10-CM

## 2023-07-10 DIAGNOSIS — C439 Malignant melanoma of skin, unspecified: Principal | ICD-10-CM

## 2023-07-10 DIAGNOSIS — C7931 Secondary malignant neoplasm of brain: Principal | ICD-10-CM

## 2023-07-10 DIAGNOSIS — C434 Malignant melanoma of scalp and neck: Principal | ICD-10-CM

## 2023-07-11 ENCOUNTER — Encounter: Admit: 2023-07-11 | Discharge: 2023-07-11 | Payer: BLUE CROSS/BLUE SHIELD | Attending: Oncology | Primary: Oncology

## 2023-07-11 DIAGNOSIS — C7931 Secondary malignant neoplasm of brain: Principal | ICD-10-CM

## 2023-07-12 DIAGNOSIS — C439 Malignant melanoma of skin, unspecified: Principal | ICD-10-CM

## 2023-07-12 DIAGNOSIS — C7931 Secondary malignant neoplasm of brain: Principal | ICD-10-CM

## 2023-07-12 DIAGNOSIS — C434 Malignant melanoma of scalp and neck: Principal | ICD-10-CM

## 2023-07-17 DIAGNOSIS — C439 Malignant melanoma of skin, unspecified: Principal | ICD-10-CM

## 2023-07-17 DIAGNOSIS — C434 Malignant melanoma of scalp and neck: Principal | ICD-10-CM

## 2023-07-17 DIAGNOSIS — C7931 Secondary malignant neoplasm of brain: Principal | ICD-10-CM

## 2023-07-21 DIAGNOSIS — F411 Generalized anxiety disorder: Principal | ICD-10-CM

## 2023-07-21 DIAGNOSIS — F3341 Major depressive disorder, recurrent, in partial remission: Principal | ICD-10-CM

## 2023-07-21 MED ORDER — DULOXETINE 30 MG CAPSULE,DELAYED RELEASE
ORAL_CAPSULE | Freq: Two times a day (BID) | ORAL | 3 refills | 0.00000 days
Start: 2023-07-21 — End: ?

## 2023-07-21 MED ORDER — SULFASALAZINE 500 MG TABLET
ORAL_TABLET | Freq: Two times a day (BID) | ORAL | 4 refills | 90.00000 days | Status: CP
Start: 2023-07-21 — End: ?

## 2023-07-22 MED ORDER — DULOXETINE 30 MG CAPSULE,DELAYED RELEASE
ORAL_CAPSULE | Freq: Two times a day (BID) | ORAL | 3 refills | 90.00000 days
Start: 2023-07-22 — End: ?

## 2023-07-24 DIAGNOSIS — J310 Chronic rhinitis: Principal | ICD-10-CM

## 2023-07-25 DIAGNOSIS — C7931 Secondary malignant neoplasm of brain: Principal | ICD-10-CM

## 2023-07-25 DIAGNOSIS — C434 Malignant melanoma of scalp and neck: Principal | ICD-10-CM

## 2023-07-25 DIAGNOSIS — C439 Malignant melanoma of skin, unspecified: Principal | ICD-10-CM

## 2023-07-28 DIAGNOSIS — C439 Malignant melanoma of skin, unspecified: Principal | ICD-10-CM

## 2023-07-28 DIAGNOSIS — C7931 Secondary malignant neoplasm of brain: Principal | ICD-10-CM

## 2023-07-28 DIAGNOSIS — C434 Malignant melanoma of scalp and neck: Principal | ICD-10-CM

## 2023-08-01 ENCOUNTER — Ambulatory Visit: Admit: 2023-08-01 | Discharge: 2023-08-01 | Payer: BLUE CROSS/BLUE SHIELD

## 2023-08-01 ENCOUNTER — Other Ambulatory Visit: Admit: 2023-08-01 | Discharge: 2023-08-01 | Payer: BLUE CROSS/BLUE SHIELD

## 2023-08-01 ENCOUNTER — Ambulatory Visit
Admit: 2023-08-01 | Discharge: 2023-08-01 | Payer: BLUE CROSS/BLUE SHIELD | Attending: Hematology & Oncology | Primary: Hematology & Oncology

## 2023-08-01 ENCOUNTER — Inpatient Hospital Stay: Admit: 2023-08-01 | Discharge: 2023-08-01 | Payer: BLUE CROSS/BLUE SHIELD

## 2023-08-01 DIAGNOSIS — C7931 Secondary malignant neoplasm of brain: Principal | ICD-10-CM

## 2023-08-01 DIAGNOSIS — C439 Malignant melanoma of skin, unspecified: Principal | ICD-10-CM

## 2023-08-01 DIAGNOSIS — C434 Malignant melanoma of scalp and neck: Principal | ICD-10-CM

## 2023-08-02 DIAGNOSIS — C7931 Secondary malignant neoplasm of brain: Principal | ICD-10-CM

## 2023-08-02 DIAGNOSIS — C439 Malignant melanoma of skin, unspecified: Principal | ICD-10-CM

## 2023-08-02 DIAGNOSIS — C434 Malignant melanoma of scalp and neck: Principal | ICD-10-CM

## 2023-08-04 DIAGNOSIS — C434 Malignant melanoma of scalp and neck: Principal | ICD-10-CM

## 2023-08-04 DIAGNOSIS — C439 Malignant melanoma of skin, unspecified: Principal | ICD-10-CM

## 2023-08-04 DIAGNOSIS — C7931 Secondary malignant neoplasm of brain: Principal | ICD-10-CM

## 2023-08-06 DIAGNOSIS — C439 Malignant melanoma of skin, unspecified: Principal | ICD-10-CM

## 2023-08-06 DIAGNOSIS — C434 Malignant melanoma of scalp and neck: Principal | ICD-10-CM

## 2023-08-06 DIAGNOSIS — C7931 Secondary malignant neoplasm of brain: Principal | ICD-10-CM

## 2023-08-08 ENCOUNTER — Ambulatory Visit
Admit: 2023-08-08 | Discharge: 2023-08-09 | Payer: BLUE CROSS/BLUE SHIELD | Attending: Hematology & Oncology | Primary: Hematology & Oncology

## 2023-08-08 DIAGNOSIS — C7931 Secondary malignant neoplasm of brain: Principal | ICD-10-CM

## 2023-08-08 DIAGNOSIS — C439 Malignant melanoma of skin, unspecified: Principal | ICD-10-CM

## 2023-08-08 DIAGNOSIS — C434 Malignant melanoma of scalp and neck: Principal | ICD-10-CM

## 2023-08-14 DIAGNOSIS — C7931 Secondary malignant neoplasm of brain: Principal | ICD-10-CM

## 2023-08-14 DIAGNOSIS — C439 Malignant melanoma of skin, unspecified: Principal | ICD-10-CM

## 2023-08-14 DIAGNOSIS — C434 Malignant melanoma of scalp and neck: Principal | ICD-10-CM

## 2023-08-15 DIAGNOSIS — C7931 Secondary malignant neoplasm of brain: Principal | ICD-10-CM

## 2023-08-15 DIAGNOSIS — C434 Malignant melanoma of scalp and neck: Principal | ICD-10-CM

## 2023-08-15 DIAGNOSIS — C439 Malignant melanoma of skin, unspecified: Principal | ICD-10-CM

## 2023-08-18 DIAGNOSIS — C434 Malignant melanoma of scalp and neck: Principal | ICD-10-CM

## 2023-08-18 DIAGNOSIS — C439 Malignant melanoma of skin, unspecified: Principal | ICD-10-CM

## 2023-08-18 DIAGNOSIS — C7931 Secondary malignant neoplasm of brain: Principal | ICD-10-CM

## 2023-08-21 DIAGNOSIS — C439 Malignant melanoma of skin, unspecified: Principal | ICD-10-CM

## 2023-08-21 DIAGNOSIS — C434 Malignant melanoma of scalp and neck: Principal | ICD-10-CM

## 2023-08-21 DIAGNOSIS — C7931 Secondary malignant neoplasm of brain: Principal | ICD-10-CM

## 2023-08-22 ENCOUNTER — Other Ambulatory Visit: Admit: 2023-08-22 | Discharge: 2023-08-22 | Payer: BLUE CROSS/BLUE SHIELD

## 2023-08-22 ENCOUNTER — Ambulatory Visit
Admit: 2023-08-22 | Discharge: 2023-08-22 | Payer: BLUE CROSS/BLUE SHIELD | Attending: Hematology & Oncology | Primary: Hematology & Oncology

## 2023-08-22 ENCOUNTER — Inpatient Hospital Stay: Admit: 2023-08-22 | Discharge: 2023-08-22 | Payer: BLUE CROSS/BLUE SHIELD

## 2023-08-22 DIAGNOSIS — C434 Malignant melanoma of scalp and neck: Principal | ICD-10-CM

## 2023-08-22 DIAGNOSIS — C439 Malignant melanoma of skin, unspecified: Principal | ICD-10-CM

## 2023-08-22 DIAGNOSIS — C7931 Secondary malignant neoplasm of brain: Principal | ICD-10-CM

## 2023-08-22 DIAGNOSIS — I10 Essential (primary) hypertension: Principal | ICD-10-CM

## 2023-08-22 MED ORDER — LISINOPRIL 20 MG TABLET
ORAL_TABLET | Freq: Every day | ORAL | 3 refills | 90.00000 days | Status: CP
Start: 2023-08-22 — End: ?

## 2023-08-22 MED ORDER — AMLODIPINE 10 MG TABLET
ORAL_TABLET | Freq: Every day | ORAL | 2 refills | 90.00000 days | Status: CP
Start: 2023-08-22 — End: ?

## 2023-08-23 DIAGNOSIS — C434 Malignant melanoma of scalp and neck: Principal | ICD-10-CM

## 2023-08-23 DIAGNOSIS — C7931 Secondary malignant neoplasm of brain: Principal | ICD-10-CM

## 2023-08-23 DIAGNOSIS — C439 Malignant melanoma of skin, unspecified: Principal | ICD-10-CM

## 2023-08-25 DIAGNOSIS — C7931 Secondary malignant neoplasm of brain: Principal | ICD-10-CM

## 2023-08-25 DIAGNOSIS — C434 Malignant melanoma of scalp and neck: Principal | ICD-10-CM

## 2023-08-25 DIAGNOSIS — C439 Malignant melanoma of skin, unspecified: Principal | ICD-10-CM

## 2023-08-29 ENCOUNTER — Ambulatory Visit
Admit: 2023-08-29 | Discharge: 2023-08-30 | Payer: BLUE CROSS/BLUE SHIELD | Attending: Hematology & Oncology | Primary: Hematology & Oncology

## 2023-08-29 MED ORDER — DEXAMETHASONE 1 MG TABLET
ORAL_TABLET | Freq: Four times a day (QID) | ORAL | 0 refills | 30.00000 days | Status: CP
Start: 2023-08-29 — End: 2024-08-28

## 2023-08-29 MED ORDER — HYDROCHLOROTHIAZIDE 25 MG TABLET
ORAL_TABLET | Freq: Every day | ORAL | 3 refills | 90.00000 days | Status: CP
Start: 2023-08-29 — End: 2024-10-02

## 2023-09-02 ENCOUNTER — Telehealth: Payer: Self-pay | Admitting: Neurology

## 2023-09-02 NOTE — Telephone Encounter (Signed)
 Request to reschedule appointment due to a conflict

## 2023-09-05 ENCOUNTER — Ambulatory Visit
Admit: 2023-09-05 | Discharge: 2023-09-06 | Payer: BLUE CROSS/BLUE SHIELD | Attending: Hematology & Oncology | Primary: Hematology & Oncology

## 2023-09-05 MED ORDER — DEXAMETHASONE 0.5 MG TABLET
ORAL_TABLET | Freq: Every day | ORAL | 0 refills | 30.00000 days | Status: CP
Start: 2023-09-05 — End: ?

## 2023-09-05 MED ORDER — LISINOPRIL 40 MG TABLET
ORAL_TABLET | Freq: Every day | ORAL | 3 refills | 100.00000 days | Status: CP
Start: 2023-09-05 — End: 2024-10-09

## 2023-09-06 DIAGNOSIS — C7931 Secondary malignant neoplasm of brain: Principal | ICD-10-CM

## 2023-09-06 DIAGNOSIS — C439 Malignant melanoma of skin, unspecified: Principal | ICD-10-CM

## 2023-09-06 DIAGNOSIS — C434 Malignant melanoma of scalp and neck: Principal | ICD-10-CM

## 2023-09-11 MED ORDER — DEXAMETHASONE 1 MG TABLET
ORAL_TABLET | Freq: Four times a day (QID) | ORAL | 1 refills | 0.00000 days
Start: 2023-09-11 — End: ?

## 2023-09-12 ENCOUNTER — Ambulatory Visit
Admit: 2023-09-12 | Discharge: 2023-09-12 | Payer: BLUE CROSS/BLUE SHIELD | Attending: Hematology & Oncology | Primary: Hematology & Oncology

## 2023-09-12 ENCOUNTER — Inpatient Hospital Stay: Admit: 2023-09-12 | Discharge: 2023-09-12 | Payer: BLUE CROSS/BLUE SHIELD

## 2023-09-12 DIAGNOSIS — C434 Malignant melanoma of scalp and neck: Principal | ICD-10-CM

## 2023-09-12 DIAGNOSIS — C7931 Secondary malignant neoplasm of brain: Principal | ICD-10-CM

## 2023-09-12 DIAGNOSIS — C439 Malignant melanoma of skin, unspecified: Principal | ICD-10-CM

## 2023-09-17 ENCOUNTER — Inpatient Hospital Stay
Admit: 2023-09-17 | Discharge: 2023-09-18 | Payer: BLUE CROSS/BLUE SHIELD | Attending: Radiation Oncology | Primary: Radiation Oncology

## 2023-09-17 DIAGNOSIS — C7931 Secondary malignant neoplasm of brain: Principal | ICD-10-CM

## 2023-09-17 DIAGNOSIS — C439 Malignant melanoma of skin, unspecified: Principal | ICD-10-CM

## 2023-09-17 DIAGNOSIS — C434 Malignant melanoma of scalp and neck: Principal | ICD-10-CM

## 2023-09-18 DIAGNOSIS — C439 Malignant melanoma of skin, unspecified: Principal | ICD-10-CM

## 2023-09-18 DIAGNOSIS — C7931 Secondary malignant neoplasm of brain: Principal | ICD-10-CM

## 2023-09-18 DIAGNOSIS — C434 Malignant melanoma of scalp and neck: Principal | ICD-10-CM

## 2023-09-19 ENCOUNTER — Other Ambulatory Visit: Admit: 2023-09-19 | Discharge: 2023-09-19 | Payer: BLUE CROSS/BLUE SHIELD

## 2023-09-19 ENCOUNTER — Inpatient Hospital Stay: Admit: 2023-09-19 | Discharge: 2023-09-19 | Payer: BLUE CROSS/BLUE SHIELD

## 2023-09-19 ENCOUNTER — Ambulatory Visit
Admit: 2023-09-19 | Discharge: 2023-09-19 | Payer: BLUE CROSS/BLUE SHIELD | Attending: Hematology & Oncology | Primary: Hematology & Oncology

## 2023-09-19 DIAGNOSIS — C7931 Secondary malignant neoplasm of brain: Principal | ICD-10-CM

## 2023-09-19 DIAGNOSIS — C434 Malignant melanoma of scalp and neck: Principal | ICD-10-CM

## 2023-09-19 DIAGNOSIS — C439 Malignant melanoma of skin, unspecified: Principal | ICD-10-CM

## 2023-09-20 DIAGNOSIS — C7931 Secondary malignant neoplasm of brain: Principal | ICD-10-CM

## 2023-09-20 DIAGNOSIS — C439 Malignant melanoma of skin, unspecified: Principal | ICD-10-CM

## 2023-09-20 DIAGNOSIS — C434 Malignant melanoma of scalp and neck: Principal | ICD-10-CM

## 2023-09-22 DIAGNOSIS — C434 Malignant melanoma of scalp and neck: Principal | ICD-10-CM

## 2023-09-22 DIAGNOSIS — C7931 Secondary malignant neoplasm of brain: Principal | ICD-10-CM

## 2023-09-22 DIAGNOSIS — C439 Malignant melanoma of skin, unspecified: Principal | ICD-10-CM

## 2023-09-23 DIAGNOSIS — C439 Malignant melanoma of skin, unspecified: Principal | ICD-10-CM

## 2023-09-23 DIAGNOSIS — C434 Malignant melanoma of scalp and neck: Principal | ICD-10-CM

## 2023-09-23 DIAGNOSIS — C7931 Secondary malignant neoplasm of brain: Principal | ICD-10-CM

## 2023-09-23 MED ORDER — PREDNISONE 10 MG TABLET
ORAL_TABLET | ORAL | 0 refills | 0.00000 days | Status: CP
Start: 2023-09-23 — End: ?

## 2023-09-24 DIAGNOSIS — C434 Malignant melanoma of scalp and neck: Principal | ICD-10-CM

## 2023-09-24 DIAGNOSIS — C439 Malignant melanoma of skin, unspecified: Principal | ICD-10-CM

## 2023-09-24 DIAGNOSIS — K5010 Crohn's disease of large intestine without complications: Principal | ICD-10-CM

## 2023-09-24 DIAGNOSIS — C7931 Secondary malignant neoplasm of brain: Principal | ICD-10-CM

## 2023-09-27 DIAGNOSIS — C439 Malignant melanoma of skin, unspecified: Principal | ICD-10-CM

## 2023-09-27 DIAGNOSIS — C434 Malignant melanoma of scalp and neck: Principal | ICD-10-CM

## 2023-09-27 DIAGNOSIS — C7931 Secondary malignant neoplasm of brain: Principal | ICD-10-CM

## 2023-09-29 DIAGNOSIS — C439 Malignant melanoma of skin, unspecified: Principal | ICD-10-CM

## 2023-09-29 DIAGNOSIS — C434 Malignant melanoma of scalp and neck: Principal | ICD-10-CM

## 2023-09-29 DIAGNOSIS — C7931 Secondary malignant neoplasm of brain: Principal | ICD-10-CM

## 2023-09-30 DIAGNOSIS — C439 Malignant melanoma of skin, unspecified: Principal | ICD-10-CM

## 2023-09-30 DIAGNOSIS — C434 Malignant melanoma of scalp and neck: Principal | ICD-10-CM

## 2023-09-30 DIAGNOSIS — C7931 Secondary malignant neoplasm of brain: Principal | ICD-10-CM

## 2023-10-01 DIAGNOSIS — C439 Malignant melanoma of skin, unspecified: Principal | ICD-10-CM

## 2023-10-01 DIAGNOSIS — C434 Malignant melanoma of scalp and neck: Principal | ICD-10-CM

## 2023-10-01 DIAGNOSIS — C7931 Secondary malignant neoplasm of brain: Principal | ICD-10-CM

## 2023-10-03 DIAGNOSIS — C439 Malignant melanoma of skin, unspecified: Principal | ICD-10-CM

## 2023-10-03 DIAGNOSIS — C434 Malignant melanoma of scalp and neck: Principal | ICD-10-CM

## 2023-10-03 DIAGNOSIS — C7931 Secondary malignant neoplasm of brain: Principal | ICD-10-CM

## 2023-10-06 DIAGNOSIS — C434 Malignant melanoma of scalp and neck: Principal | ICD-10-CM

## 2023-10-06 DIAGNOSIS — C439 Malignant melanoma of skin, unspecified: Principal | ICD-10-CM

## 2023-10-06 DIAGNOSIS — C7931 Secondary malignant neoplasm of brain: Principal | ICD-10-CM

## 2023-10-07 DIAGNOSIS — C439 Malignant melanoma of skin, unspecified: Principal | ICD-10-CM

## 2023-10-07 DIAGNOSIS — C7931 Secondary malignant neoplasm of brain: Principal | ICD-10-CM

## 2023-10-07 DIAGNOSIS — C434 Malignant melanoma of scalp and neck: Principal | ICD-10-CM

## 2023-10-08 DIAGNOSIS — C7931 Secondary malignant neoplasm of brain: Principal | ICD-10-CM

## 2023-10-08 DIAGNOSIS — C434 Malignant melanoma of scalp and neck: Principal | ICD-10-CM

## 2023-10-08 DIAGNOSIS — C439 Malignant melanoma of skin, unspecified: Principal | ICD-10-CM

## 2023-10-09 DIAGNOSIS — C434 Malignant melanoma of scalp and neck: Principal | ICD-10-CM

## 2023-10-09 DIAGNOSIS — C7931 Secondary malignant neoplasm of brain: Principal | ICD-10-CM

## 2023-10-09 DIAGNOSIS — C439 Malignant melanoma of skin, unspecified: Principal | ICD-10-CM

## 2023-10-10 ENCOUNTER — Ambulatory Visit
Admit: 2023-10-10 | Discharge: 2023-10-11 | Payer: BLUE CROSS/BLUE SHIELD | Attending: Hematology & Oncology | Primary: Hematology & Oncology

## 2023-10-12 DIAGNOSIS — C439 Malignant melanoma of skin, unspecified: Principal | ICD-10-CM

## 2023-10-12 DIAGNOSIS — C7931 Secondary malignant neoplasm of brain: Principal | ICD-10-CM

## 2023-10-12 DIAGNOSIS — C434 Malignant melanoma of scalp and neck: Principal | ICD-10-CM

## 2023-10-13 ENCOUNTER — Encounter: Admit: 2023-10-13 | Discharge: 2023-10-14 | Payer: BLUE CROSS/BLUE SHIELD | Attending: Clinical | Primary: Clinical

## 2023-10-13 DIAGNOSIS — C7931 Secondary malignant neoplasm of brain: Principal | ICD-10-CM

## 2023-10-13 DIAGNOSIS — C439 Malignant melanoma of skin, unspecified: Principal | ICD-10-CM

## 2023-10-13 DIAGNOSIS — C434 Malignant melanoma of scalp and neck: Principal | ICD-10-CM

## 2023-10-14 ENCOUNTER — Ambulatory Visit: Admitting: Neurology

## 2023-10-14 DIAGNOSIS — C439 Malignant melanoma of skin, unspecified: Principal | ICD-10-CM

## 2023-10-14 DIAGNOSIS — C7931 Secondary malignant neoplasm of brain: Principal | ICD-10-CM

## 2023-10-14 DIAGNOSIS — C434 Malignant melanoma of scalp and neck: Principal | ICD-10-CM

## 2023-10-15 ENCOUNTER — Inpatient Hospital Stay
Admit: 2023-10-15 | Discharge: 2023-11-07 | Payer: BLUE CROSS/BLUE SHIELD | Attending: Radiation Oncology | Primary: Radiation Oncology

## 2023-10-15 DIAGNOSIS — C434 Malignant melanoma of scalp and neck: Principal | ICD-10-CM

## 2023-10-15 DIAGNOSIS — C7931 Secondary malignant neoplasm of brain: Principal | ICD-10-CM

## 2023-10-15 DIAGNOSIS — C439 Malignant melanoma of skin, unspecified: Principal | ICD-10-CM

## 2023-10-16 ENCOUNTER — Inpatient Hospital Stay
Admit: 2023-10-16 | Discharge: 2023-11-14 | Payer: BLUE CROSS/BLUE SHIELD | Attending: Internal Medicine | Primary: Internal Medicine

## 2023-10-16 ENCOUNTER — Inpatient Hospital Stay
Admit: 2023-10-16 | Discharge: 2023-11-14 | Payer: BLUE CROSS/BLUE SHIELD | Attending: Radiation Oncology | Primary: Radiation Oncology

## 2023-10-16 ENCOUNTER — Ambulatory Visit
Admit: 2023-10-16 | Discharge: 2023-11-14 | Payer: BLUE CROSS/BLUE SHIELD | Attending: Radiation Oncology | Primary: Radiation Oncology

## 2023-10-17 DIAGNOSIS — C439 Malignant melanoma of skin, unspecified: Principal | ICD-10-CM

## 2023-10-17 DIAGNOSIS — C7931 Secondary malignant neoplasm of brain: Principal | ICD-10-CM

## 2023-10-17 DIAGNOSIS — C434 Malignant melanoma of scalp and neck: Principal | ICD-10-CM

## 2023-10-18 DIAGNOSIS — C7931 Secondary malignant neoplasm of brain: Principal | ICD-10-CM

## 2023-10-18 DIAGNOSIS — C439 Malignant melanoma of skin, unspecified: Principal | ICD-10-CM

## 2023-10-18 DIAGNOSIS — C434 Malignant melanoma of scalp and neck: Principal | ICD-10-CM

## 2023-10-20 DIAGNOSIS — C439 Malignant melanoma of skin, unspecified: Principal | ICD-10-CM

## 2023-10-20 DIAGNOSIS — C7931 Secondary malignant neoplasm of brain: Principal | ICD-10-CM

## 2023-10-20 DIAGNOSIS — C434 Malignant melanoma of scalp and neck: Principal | ICD-10-CM

## 2023-10-22 DIAGNOSIS — E278 Other specified disorders of adrenal gland: Principal | ICD-10-CM

## 2023-10-22 DIAGNOSIS — K5010 Crohn's disease of large intestine without complications: Principal | ICD-10-CM

## 2023-10-22 DIAGNOSIS — C439 Malignant melanoma of skin, unspecified: Principal | ICD-10-CM

## 2023-10-22 DIAGNOSIS — C7931 Secondary malignant neoplasm of brain: Principal | ICD-10-CM

## 2023-10-22 DIAGNOSIS — C434 Malignant melanoma of scalp and neck: Principal | ICD-10-CM

## 2023-10-22 MED ORDER — LEVETIRACETAM 500 MG TABLET
ORAL_TABLET | Freq: Two times a day (BID) | ORAL | 3 refills | 90.00000 days
Start: 2023-10-22 — End: ?

## 2023-10-22 MED ORDER — SULFASALAZINE 500 MG TABLET
ORAL_TABLET | Freq: Two times a day (BID) | ORAL | 3 refills | 90.00000 days | Status: CP
Start: 2023-10-22 — End: ?

## 2023-10-22 MED ORDER — BUDESONIDE DR - ER 3 MG CAPSULE,DELAYED,EXTENDED RELEASE
ORAL_CAPSULE | Freq: Every morning | ORAL | 11 refills | 30.00000 days
Start: 2023-10-22 — End: 2024-10-21

## 2023-10-22 MED ORDER — LEVOTHYROXINE 125 MCG TABLET
ORAL_TABLET | Freq: Every day | ORAL | 3 refills | 90.00000 days
Start: 2023-10-22 — End: 2024-10-21

## 2023-10-22 MED ORDER — LISINOPRIL 40 MG TABLET
ORAL_TABLET | Freq: Every day | ORAL | 3 refills | 100.00000 days
Start: 2023-10-22 — End: 2024-11-25

## 2023-10-22 MED ORDER — AMLODIPINE 10 MG TABLET
ORAL_TABLET | Freq: Every day | ORAL | 2 refills | 90.00000 days
Start: 2023-10-22 — End: ?

## 2023-10-22 MED ORDER — CALCITRIOL 0.25 MCG CAPSULE
ORAL_CAPSULE | Freq: Every day | ORAL | 3 refills | 90.00000 days
Start: 2023-10-22 — End: 2024-10-21

## 2023-10-23 DIAGNOSIS — C434 Malignant melanoma of scalp and neck: Principal | ICD-10-CM

## 2023-10-23 DIAGNOSIS — C7931 Secondary malignant neoplasm of brain: Principal | ICD-10-CM

## 2023-10-23 DIAGNOSIS — C439 Malignant melanoma of skin, unspecified: Principal | ICD-10-CM

## 2023-10-24 ENCOUNTER — Ambulatory Visit
Admit: 2023-10-24 | Discharge: 2023-10-24 | Payer: BLUE CROSS/BLUE SHIELD | Attending: Hematology & Oncology | Primary: Hematology & Oncology

## 2023-10-24 ENCOUNTER — Inpatient Hospital Stay: Admit: 2023-10-24 | Discharge: 2023-10-24 | Payer: BLUE CROSS/BLUE SHIELD

## 2023-10-24 DIAGNOSIS — C439 Malignant melanoma of skin, unspecified: Principal | ICD-10-CM

## 2023-10-24 DIAGNOSIS — C434 Malignant melanoma of scalp and neck: Principal | ICD-10-CM

## 2023-10-24 DIAGNOSIS — C7931 Secondary malignant neoplasm of brain: Principal | ICD-10-CM

## 2023-10-24 MED ORDER — LEVOTHYROXINE 125 MCG TABLET
ORAL_TABLET | Freq: Every day | ORAL | 3 refills | 90.00000 days
Start: 2023-10-24 — End: 2024-10-23

## 2023-10-24 MED ORDER — CALCITRIOL 0.25 MCG CAPSULE
ORAL_CAPSULE | Freq: Every day | ORAL | 3 refills | 90.00000 days
Start: 2023-10-24 — End: 2024-10-23

## 2023-10-24 MED ORDER — PREDNISONE 10 MG TABLET
ORAL_TABLET | 1 refills | 0.00000 days
Start: 2023-10-24 — End: ?

## 2023-10-28 DIAGNOSIS — C434 Malignant melanoma of scalp and neck: Principal | ICD-10-CM

## 2023-10-28 DIAGNOSIS — C439 Malignant melanoma of skin, unspecified: Principal | ICD-10-CM

## 2023-10-28 DIAGNOSIS — C7931 Secondary malignant neoplasm of brain: Principal | ICD-10-CM

## 2023-11-03 ENCOUNTER — Encounter: Admit: 2023-11-03 | Discharge: 2023-11-04 | Payer: BLUE CROSS/BLUE SHIELD | Attending: Clinical | Primary: Clinical

## 2023-11-03 DIAGNOSIS — F411 Generalized anxiety disorder: Principal | ICD-10-CM

## 2023-11-03 DIAGNOSIS — F331 Major depressive disorder, recurrent, moderate: Principal | ICD-10-CM

## 2023-11-03 DIAGNOSIS — C801 Malignant (primary) neoplasm, unspecified: Principal | ICD-10-CM

## 2023-11-04 ENCOUNTER — Encounter: Admit: 2023-11-04 | Discharge: 2023-11-05 | Payer: BLUE CROSS/BLUE SHIELD

## 2023-11-05 ENCOUNTER — Inpatient Hospital Stay: Admit: 2023-11-05 | Discharge: 2023-11-05 | Payer: BLUE CROSS/BLUE SHIELD

## 2023-11-05 DIAGNOSIS — C7931 Secondary malignant neoplasm of brain: Principal | ICD-10-CM

## 2023-11-05 DIAGNOSIS — C439 Malignant melanoma of skin, unspecified: Principal | ICD-10-CM

## 2023-11-05 DIAGNOSIS — C434 Malignant melanoma of scalp and neck: Principal | ICD-10-CM

## 2023-11-06 DIAGNOSIS — C439 Malignant melanoma of skin, unspecified: Principal | ICD-10-CM

## 2023-11-06 DIAGNOSIS — C434 Malignant melanoma of scalp and neck: Principal | ICD-10-CM

## 2023-11-06 DIAGNOSIS — C7931 Secondary malignant neoplasm of brain: Principal | ICD-10-CM

## 2023-11-06 MED ORDER — DULOXETINE 30 MG CAPSULE,DELAYED RELEASE
ORAL_CAPSULE | Freq: Two times a day (BID) | ORAL | 3 refills | 0.00000 days
Start: 2023-11-06 — End: ?

## 2023-11-07 MED ORDER — DULOXETINE 30 MG CAPSULE,DELAYED RELEASE
ORAL_CAPSULE | Freq: Two times a day (BID) | ORAL | 3 refills | 90.00000 days
Start: 2023-11-07 — End: ?

## 2023-11-10 DIAGNOSIS — C434 Malignant melanoma of scalp and neck: Principal | ICD-10-CM

## 2023-11-10 DIAGNOSIS — C7931 Secondary malignant neoplasm of brain: Principal | ICD-10-CM

## 2023-11-10 DIAGNOSIS — C439 Malignant melanoma of skin, unspecified: Principal | ICD-10-CM

## 2023-11-11 DIAGNOSIS — C439 Malignant melanoma of skin, unspecified: Principal | ICD-10-CM

## 2023-11-11 DIAGNOSIS — C7931 Secondary malignant neoplasm of brain: Principal | ICD-10-CM

## 2023-11-11 DIAGNOSIS — C434 Malignant melanoma of scalp and neck: Principal | ICD-10-CM

## 2023-11-12 DIAGNOSIS — C439 Malignant melanoma of skin, unspecified: Principal | ICD-10-CM

## 2023-11-12 DIAGNOSIS — C7931 Secondary malignant neoplasm of brain: Principal | ICD-10-CM

## 2023-11-12 DIAGNOSIS — C434 Malignant melanoma of scalp and neck: Principal | ICD-10-CM

## 2023-11-15 ENCOUNTER — Inpatient Hospital Stay
Admit: 2023-11-15 | Discharge: 2023-11-20 | Payer: BLUE CROSS/BLUE SHIELD | Attending: Radiation Oncology | Primary: Radiation Oncology

## 2023-11-15 ENCOUNTER — Ambulatory Visit: Admit: 2023-11-15 | Payer: BLUE CROSS/BLUE SHIELD | Attending: Radiation Oncology | Primary: Radiation Oncology

## 2023-11-15 ENCOUNTER — Inpatient Hospital Stay
Admit: 2023-11-15 | Discharge: 2023-11-18 | Payer: BLUE CROSS/BLUE SHIELD | Attending: Radiation Oncology | Primary: Radiation Oncology

## 2023-11-15 ENCOUNTER — Inpatient Hospital Stay: Admit: 2023-11-15 | Discharge: 2023-11-16 | Payer: BLUE CROSS/BLUE SHIELD

## 2023-11-15 ENCOUNTER — Inpatient Hospital Stay
Admit: 2023-11-15 | Discharge: 2023-11-19 | Payer: BLUE CROSS/BLUE SHIELD | Attending: Radiation Oncology | Primary: Radiation Oncology

## 2023-11-18 MED ORDER — PANTOPRAZOLE 40 MG TABLET,DELAYED RELEASE
ORAL_TABLET | Freq: Every day | ORAL | 2 refills | 30.00000 days | Status: CP
Start: 2023-11-18 — End: 2024-02-16

## 2023-11-19 ENCOUNTER — Other Ambulatory Visit: Admit: 2023-11-19 | Discharge: 2023-11-19 | Payer: BLUE CROSS/BLUE SHIELD

## 2023-11-19 ENCOUNTER — Ambulatory Visit
Admit: 2023-11-19 | Discharge: 2023-11-19 | Payer: BLUE CROSS/BLUE SHIELD | Attending: Hematology & Oncology | Primary: Hematology & Oncology

## 2023-11-19 ENCOUNTER — Inpatient Hospital Stay: Admit: 2023-11-19 | Discharge: 2023-11-19 | Payer: BLUE CROSS/BLUE SHIELD

## 2023-11-19 DIAGNOSIS — K5010 Crohn's disease of large intestine without complications: Principal | ICD-10-CM

## 2023-11-19 DIAGNOSIS — C439 Malignant melanoma of skin, unspecified: Principal | ICD-10-CM

## 2023-11-19 DIAGNOSIS — C7931 Secondary malignant neoplasm of brain: Principal | ICD-10-CM

## 2023-11-19 DIAGNOSIS — C434 Malignant melanoma of scalp and neck: Principal | ICD-10-CM

## 2023-11-19 MED ORDER — ENTYVIO PEN 108 MG/0.68 ML SUBCUTANEOUS PEN INJECTOR
SUBCUTANEOUS | 2 refills | 0.00000 days | Status: CP
Start: 2023-11-19 — End: ?

## 2023-11-19 MED ORDER — DEXAMETHASONE 2 MG TABLET
ORAL_TABLET | ORAL | 0 refills | 14.00000 days | Status: CP
Start: 2023-11-19 — End: 2023-12-03

## 2023-11-20 ENCOUNTER — Encounter: Admit: 2023-11-20 | Discharge: 2023-11-21 | Payer: BLUE CROSS/BLUE SHIELD | Attending: Clinical | Primary: Clinical

## 2023-11-20 DIAGNOSIS — C434 Malignant melanoma of scalp and neck: Principal | ICD-10-CM

## 2023-11-20 DIAGNOSIS — C439 Malignant melanoma of skin, unspecified: Principal | ICD-10-CM

## 2023-11-20 DIAGNOSIS — C7931 Secondary malignant neoplasm of brain: Principal | ICD-10-CM

## 2023-11-21 DIAGNOSIS — C7931 Secondary malignant neoplasm of brain: Principal | ICD-10-CM

## 2023-11-21 DIAGNOSIS — C439 Malignant melanoma of skin, unspecified: Principal | ICD-10-CM

## 2023-11-21 DIAGNOSIS — C434 Malignant melanoma of scalp and neck: Principal | ICD-10-CM

## 2023-11-23 DIAGNOSIS — C7931 Secondary malignant neoplasm of brain: Principal | ICD-10-CM

## 2023-11-23 DIAGNOSIS — C439 Malignant melanoma of skin, unspecified: Principal | ICD-10-CM

## 2023-11-23 DIAGNOSIS — C434 Malignant melanoma of scalp and neck: Principal | ICD-10-CM

## 2023-11-28 DIAGNOSIS — C7931 Secondary malignant neoplasm of brain: Principal | ICD-10-CM

## 2023-11-28 DIAGNOSIS — C439 Malignant melanoma of skin, unspecified: Principal | ICD-10-CM

## 2023-11-28 DIAGNOSIS — C434 Malignant melanoma of scalp and neck: Principal | ICD-10-CM

## 2023-12-01 MED ORDER — ENTYVIO PEN 108 MG/0.68 ML SUBCUTANEOUS PEN INJECTOR
SUBCUTANEOUS | 2 refills | 0.00000 days | Status: CP
Start: 2023-12-01 — End: ?

## 2023-12-01 MED ORDER — DULOXETINE 30 MG CAPSULE,DELAYED RELEASE
ORAL_CAPSULE | Freq: Two times a day (BID) | ORAL | 0 refills | 30.00000 days | Status: CP
Start: 2023-12-01 — End: 2023-12-31

## 2023-12-03 ENCOUNTER — Inpatient Hospital Stay: Admit: 2023-12-03 | Discharge: 2023-12-04 | Payer: BLUE CROSS/BLUE SHIELD

## 2023-12-04 ENCOUNTER — Encounter
Admit: 2023-12-04 | Discharge: 2023-12-05 | Payer: BLUE CROSS/BLUE SHIELD | Attending: Gastroenterology | Primary: Gastroenterology

## 2023-12-04 DIAGNOSIS — K5010 Crohn's disease of large intestine without complications: Principal | ICD-10-CM

## 2023-12-04 DIAGNOSIS — C439 Malignant melanoma of skin, unspecified: Principal | ICD-10-CM

## 2023-12-04 DIAGNOSIS — C7931 Secondary malignant neoplasm of brain: Principal | ICD-10-CM

## 2023-12-04 DIAGNOSIS — C434 Malignant melanoma of scalp and neck: Principal | ICD-10-CM

## 2023-12-08 DIAGNOSIS — C434 Malignant melanoma of scalp and neck: Principal | ICD-10-CM

## 2023-12-08 DIAGNOSIS — C7931 Secondary malignant neoplasm of brain: Principal | ICD-10-CM

## 2023-12-08 DIAGNOSIS — C439 Malignant melanoma of skin, unspecified: Principal | ICD-10-CM

## 2023-12-10 DIAGNOSIS — C439 Malignant melanoma of skin, unspecified: Principal | ICD-10-CM

## 2023-12-10 DIAGNOSIS — C7931 Secondary malignant neoplasm of brain: Principal | ICD-10-CM

## 2023-12-10 DIAGNOSIS — C434 Malignant melanoma of scalp and neck: Principal | ICD-10-CM

## 2023-12-12 DIAGNOSIS — C439 Malignant melanoma of skin, unspecified: Principal | ICD-10-CM

## 2023-12-12 DIAGNOSIS — C7931 Secondary malignant neoplasm of brain: Principal | ICD-10-CM

## 2023-12-12 DIAGNOSIS — C434 Malignant melanoma of scalp and neck: Principal | ICD-10-CM

## 2023-12-15 ENCOUNTER — Ambulatory Visit: Admit: 2023-12-15 | Payer: BLUE CROSS/BLUE SHIELD | Attending: Clinical | Primary: Clinical

## 2023-12-15 ENCOUNTER — Ambulatory Visit: Admit: 2023-12-15 | Payer: BLUE CROSS/BLUE SHIELD

## 2023-12-15 ENCOUNTER — Encounter: Payer: Self-pay | Admitting: Neurology

## 2023-12-15 ENCOUNTER — Ambulatory Visit: Admitting: Neurology

## 2023-12-15 VITALS — BP 124/90 | HR 117 | Temp 98.2°F | Ht 74.0 in | Wt 298.0 lb

## 2023-12-15 DIAGNOSIS — C439 Malignant melanoma of skin, unspecified: Secondary | ICD-10-CM | POA: Diagnosis not present

## 2023-12-15 DIAGNOSIS — C7931 Secondary malignant neoplasm of brain: Secondary | ICD-10-CM | POA: Diagnosis not present

## 2023-12-15 DIAGNOSIS — G40909 Epilepsy, unspecified, not intractable, without status epilepticus: Secondary | ICD-10-CM | POA: Diagnosis not present

## 2023-12-15 MED ORDER — LEVETIRACETAM 500 MG PO TABS: 500.0000 mg | ORAL_TABLET | Freq: Two times a day (BID) | ORAL | 3 refills | Status: AC

## 2023-12-15 NOTE — Patient Instructions (Signed)
 Continue with Keppra  500 mg twice daily  Continue to follow up with your oncologist  Again discussed driving restriction, patient understand that he should not drive until cleared by oncologist.  Continue to follow up with PCP  Return in 6 months or sooner if worse

## 2023-12-15 NOTE — Progress Notes (Signed)
 [facility]  PATIENT: Lee Castillo DOB: 12-02-1975  REQUESTING CLINICIAN: Matthews Elida HERO, MD HISTORY FROM: Patient and sister over the phone/Chart review  REASON FOR VISIT: Seizure follow up    HISTORICAL  CHIEF COMPLAINT:  Chief Complaint  Patient presents with   Seizures    Last serizure was around May. Unsure how long it lasted.     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  Seizure occurred on June 5.  Per chart review, patient fell and had seizure-like activity for about 90 seconds, with hitting his head.  Initially he was confused and in the ED he was also noted to have a second episode of aphasia.  His workup at that time show numerous metastasis lesions to the brain, with his previous postsurgical changes.  He was started on Keppra  500 mg twice daily.  He denies any side effect from the medication and reports compliance.  He denies any additional seizure or seizure like activity. He has been followed at [facility] oncology, his steroid doses has been been changed lately, he is also doing radiotherapy.  He understands that he should not drive until cleared by oncologist.    OTHER MEDICAL CONDITIONS: Metastatic melanoma to the brain, obesity, hypertension, hyperlipidemia  REVIEW OF SYSTEMS: Full 14 system review of systems performed and negative with exception of: As noted in the HPI  ALLERGIES: Allergies  Allergen Reactions   Coconut Flavoring Agent (Non-Screening) Nausea Only    coconut   Compazine [Prochlorperazine] Rash    HOME MEDICATIONS: Outpatient Medications Prior to Visit  Medication Sig Dispense Refill   acetaminophen  (TYLENOL ) 325 MG tablet Take 650 mg by mouth every 6 (six) hours as needed for mild pain (pain score 1-3), moderate pain (pain score 4-6)  or headache.     amLODipine  (NORVASC ) 5 MG tablet Take 10 mg by mouth daily.     ascorbic acid  (VITAMIN C ) 500 MG tablet Take 1,000 mg by mouth daily.     BRAFTOVI 75 MG capsule Take by mouth.     budesonide  (ENTOCORT EC ) 3 MG 24 hr capsule Take 6 mg by mouth daily.     calcitRIOL  (ROCALTROL ) 0.25 MCG capsule For bone health.  Resume after cleared by [facility]. (Patient taking differently: Take 0.25 mcg by mouth daily.)     calcium  carbonate (OS-CAL) 1250 (500 Ca) MG chewable tablet REsume when taking a regular diet/per [facility] recommendations (Patient taking differently: 2 tablets 2 (two) times daily.)     dexamethasone  (DECADRON ) 2 MG tablet Take 1 tablet (2 mg total) by mouth every 12 (twelve) hours. 60 tablet 0   DULoxetine  (CYMBALTA ) 30 MG capsule Take 30 mg by mouth 2 (two) times daily.     ezetimibe  (ZETIA ) 10 MG tablet Take 10 mg by mouth daily.     fexofenadine (ALLEGRA) 180 MG tablet Take 180 mg by mouth every morning.     fluticasone  (FLONASE ) 50 MCG/ACT nasal spray Place 1 spray into both nostrils daily.     folic acid  (FOLVITE ) 1 MG tablet Take 1 mg by mouth daily.     levothyroxine  (SYNTHROID , LEVOTHROID) 125 MCG tablet Resume per instructions per [facility].  For low thyroid function (Patient taking differently: Take 375 mcg by mouth daily before breakfast.)     lisinopril  (ZESTRIL ) 10 MG tablet Take 10 mg  by mouth daily.     [name] 15 MG tablet Take by mouth.     Multiple Vitamin (MULTIVITAMIN WITH MINERALS) TABS tablet Take 1 tablet by mouth daily.     oxyCODONE (OXY IR/ROXICODONE) 5 MG immediate release tablet Take 5 mg by mouth every 4 (four) hours as needed.     sulfaSALAzine  (AZULFIDINE ) 500 MG tablet Take 1,000 mg by mouth 2 (two) times daily.     zinc  sulfate, 50mg  elemental zinc , 220 (50 Zn) MG capsule Take 220 mg by mouth daily.     levETIRAcetam  (KEPPRA ) 500 MG tablet Take 1 tablet (500 mg total) by mouth 2 (two) times daily. 60 tablet 0   No facility-administered medications prior to  visit.    PAST MEDICAL HISTORY: Past Medical History:  Diagnosis Date   Depression    Diverticulitis    Melanoma (HCC)    Melanoma metastatic to brain (HCC)    Neuropathy    Spondylitis, ankylosing (HCC)    Thyroid cancer (HCC)    Umbilical hernia     PAST SURGICAL HISTORY: Past Surgical History:  Procedure Laterality Date   BRAIN SURGERY     Multiple   COLON SURGERY     CRANIOTOMY FOR TUMOR     With gamma tiles placed   HERNIA REPAIR     Multiple   LYMPH NODE DISSECTION     THYROID SURGERY      FAMILY HISTORY: Family History  Problem Relation Age of Onset   Atrial fibrillation Mother    Sarcoidosis Mother    Coronary artery disease Father    Heart failure Father    Hypertension Father    Colon cancer Father    Diabetes Father    Kidney failure Father     SOCIAL HISTORY: Social History   Socioeconomic History   Marital status: Single    Spouse name: Not on file   Number of children: Not on file   Years of education: Not on file   Highest education level: Not on file  Occupational History   Not on file  Tobacco Use   Smoking status: Never   Smokeless tobacco: Never  Substance and Sexual Activity   Alcohol use: Yes   Drug use: Not on file   Sexual activity: Not on file  Other Topics Concern   Not on file  Social History Narrative   Not on file   Social Drivers of Health   Financial Resource Strain: Low Risk (03/25/2023)   Received from [facility]   Overall Financial Resource Strain (CARDIA)    Difficulty of Paying Living Expenses: Not hard at all  Food Insecurity: No Food Insecurity (07/04/2023)   Received from [facility]   Hunger Vital Sign    Within the past 12 months, you worried that your food would run out before you got the money to buy more.: Never true    Within the past 12 months, the food you bought just didn't last and you didn't have money to get more.: Never true  Transportation Needs: No Transportation Needs  (07/04/2023)   Received from [facility]   PRAPARE - Transportation    Lack of Transportation (Medical): No    Lack of Transportation (Non-Medical): No  Physical Activity: Inactive (11/05/2021)   Received from [facility]   Exercise Vital Sign    On average, how many days per week do you engage in moderate to strenuous exercise (like a brisk walk)?: 0 days  On average, how many minutes do you engage in exercise at this level?: 0 min  Stress: Stress Concern Present (11/05/2021)   Received from [facility] - Occupational Stress Questionnaire    Feeling of Stress : To some extent  Social Connections: Moderately Isolated (11/05/2021)   Received from [facility]   Social Connection and Isolation Panel    In a typical week, how many times do you talk on the phone with family, friends, or neighbors?: More than three times a week    How often do you get together with friends or relatives?: Once a week    How often do you attend church or religious services?: More than 4 times per year    Do you belong to any clubs or organizations such as church groups, unions, fraternal or athletic groups, or school groups?: No    How often do you attend meetings of the clubs or organizations you belong to?: Never    Are you married, widowed, divorced, separated, never married, or living with a partner?: Never married  Intimate Partner Violence: Not At Risk (11/19/2023)   Received from [facility]   Humiliation, Afraid, Rape, and Kick questionnaire    Within the last year, have you been afraid of your partner or ex-partner?: No    Within the last year, have you been humiliated or emotionally abused in other ways by your partner or ex-partner?: No    Within the last year, have you been kicked, hit, slapped, or otherwise physically hurt by your partner or ex-partner?: No    Within the last year, have you been raped or forced to have any kind of sexual  activity by your partner or ex-partner?: No    PHYSICAL EXAM  GENERAL EXAM/CONSTITUTIONAL: Vitals:  Vitals:   12/15/23 1318  BP: (!) 124/90  Pulse: (!) 117  Temp: 98.2 F (36.8 C)  TempSrc: Oral  SpO2: 96%  Weight: 298 lb (135.2 kg)  Height: 6' 2 (1.88 m)   Body mass index is 38.26 kg/m. Wt Readings from Last 3 Encounters:  12/15/23 298 lb (135.2 kg)  06/19/23 300 lb (136.1 kg)  06/20/16 (!) 316 lb 14.4 oz (143.7 kg)   Patient is in no distress; well developed, nourished and groomed; neck is supple  MUSCULOSKELETAL: Gait, strength, tone, movements noted in Neurologic exam below  NEUROLOGIC: MENTAL STATUS:      No data to display         awake, alert, oriented to person, place and time recent and remote memory intact normal attention and concentration language fluent, comprehension intact, naming intact fund of knowledge appropriate  CRANIAL NERVE:  2nd, 3rd, 4th, 6th - Visual fields full to confrontation, extraocular muscles intact, no nystagmus 5th - facial sensation symmetric 7th - facial strength symmetric 8th - hearing intact 9th - palate elevates symmetrically, uvula midline 11th - shoulder shrug symmetric 12th - tongue protrusion midline  MOTOR:  normal bulk and tone, full strength in the BUE, BLE  SENSORY:  normal and symmetric to light touch  COORDINATION:  finger-nose-finger, fine finger movements normal  GAIT/STATION:  normal    DIAGNOSTIC DATA (LABS, IMAGING, TESTING) - I reviewed patient records, labs, notes, testing and imaging myself where available.  Lab Results  Component Value Date   WBC 7.4 06/19/2023   HGB 12.6 (L) 06/19/2023   HCT 39.3 06/19/2023   MCV 89.1 06/19/2023   PLT 273 06/19/2023  Component Value Date/Time   NA 139 06/19/2023 0824   K 4.2 06/19/2023 0824   CL 105 06/19/2023 0824   CO2 21 (L) 06/19/2023 0824   GLUCOSE 102 (H) 06/19/2023 0824   BUN 13 06/19/2023 0824   CREATININE 0.85 06/19/2023 0824    CALCIUM  7.8 (L) 06/19/2023 0824   PROT 6.9 06/20/2016 0200   ALBUMIN 4.0 06/20/2016 0200   AST 40 06/20/2016 0200   ALT 35 06/20/2016 0200   ALKPHOS 54 06/20/2016 0200   BILITOT 0.9 06/20/2016 0200   GFRNONAA >60 06/19/2023 0824   GFRAA >60 06/20/2016 0200   No results found for: CHOL, HDL, LDLCALC, LDLDIRECT, TRIG No results found for: HGBA1C Lab Results  Component Value Date   VITAMINB12 301 01/23/2010   Lab Results  Component Value Date   TSH 2.311 04/15/2015    MRI Brain 06/19/2023 1. Innumerable lesions within the supratentorial greater than infratentorial brain consistent with metastases (measuring up to 11 mm). Gyriform focus of enhancement along the left frontal operculum and insula, which may reflect an additional parenchymal metastasis and/or leptomeningeal metastatic disease. Many lesions appear hemorrhagic. Edema surrounding multiple lesions, greatest within the anterior right frontal lobe/genu of corpus callosum, left frontal operculum and left temporal lobe. No midline shift. 2. Prior right frontal craniotomy with resection cavity in the underlying right frontal lobe. 3. Chronic blood products along the bilateral parietooccipital lobes, within the ventricular system, along the cerebellum, along the medulla and along the visualized upper cervical spinal cord compatible with sequelae of prior subarachnoid hemorrhage. 4. Background advanced cerebral white matter disease, nonspecific but likely at least partly reflecting treatment-related changes. 5. Indeterminate 12 mm enhancing lesion within the left parietal calvarium. An osseous metastasis is a consideration. Attention recommended on imaging follow-up. 6. Paranasal sinus disease as described.   MRI Brain 06/19/2023 - Intermittent slow, right frontal region   I personally reviewed brain Images and previous EEG reports.   ASSESSMENT AND PLAN  48 y.o. year old male  with melanoma, metastasis to the brain,  obesity, hypertension, hyperlipidemia who is presenting after his first lifetime seizure in June.  Seizure likely related to brain mets and edema.  He is currently on Keppra  500 mg twice daily and dexamethasone , with his other chemotherapy drugs and radiation therapy.  He has not had any additional seizures.  Plan will be for patient to continue Keppra  500 mg twice daily, I have explained to patient that the Keppra  is likely lifelong with his previous history of craniotomy.  We also discussed driving restriction until cleared by oncology.  He voiced understanding.  I will see him in 6 months for follow-up or sooner if worse   1. Seizure disorder (HCC)   2. Metastatic melanoma (HCC)   3. Malignant melanoma metastatic to brain [facility])     Patient Instructions  Continue with Keppra  500 mg twice daily  Continue to follow up with your oncologist  Again discussed driving restriction, patient understand that he should not drive until cleared by oncologist.  Continue to follow up with PCP  Return in 6 months or sooner if worse   Per   DMV statutes, patients with seizures are not allowed to drive until they have been seizure-free for six months.  Other recommendations include using caution when using heavy equipment or power tools. Avoid working on ladders or at heights. Take showers instead of baths.  Do not swim alone.  Ensure the water temperature is not too high on the home water heater.  Do not go swimming alone. Do not lock yourself in a room alone (i.e. bathroom). When caring for infants or small children, sit down when holding, feeding, or changing them to minimize risk of injury to the child in the event you have a seizure. Maintain good sleep hygiene. Avoid alcohol.  Also recommend adequate sleep, hydration, good diet and minimize stress.   During the Seizure  - First, ensure adequate ventilation and place patients on the floor on their left side  Loosen clothing around the neck and  ensure the airway is patent. If the patient is clenching the teeth, do not force the mouth open with any object as this can cause severe damage - Remove all items from the surrounding that can be hazardous. The patient may be oblivious to what's happening and may not even know what he or she is doing. If the patient is confused and wandering, either gently guide him/her away and block access to outside areas - Reassure the individual and be comforting - Call 911. In most cases, the seizure ends before EMS arrives. However, there are cases when seizures may last over 3 to 5 minutes. Or the individual may have developed breathing difficulties or severe injuries. If a pregnant patient or a person with diabetes develops a seizure, it is prudent to call an ambulance. - Finally, if the patient does not regain full consciousness, then call EMS. Most patients will remain confused for about 45 to 90 minutes after a seizure, so you must use judgment in calling for help. - Avoid restraints but make sure the patient is in a bed with padded side rails - Place the individual in a lateral position with the neck slightly flexed; this will help the saliva drain from the mouth and prevent the tongue from falling backward - Remove all nearby furniture and other hazards from the area - Provide verbal assurance as the individual is regaining consciousness - Provide the patient with privacy if possible - Call for help and start treatment as ordered by the caregiver   After the Seizure (Postictal Stage)  After a seizure, most patients experience confusion, fatigue, muscle pain and/or a headache. Thus, one should permit the individual to sleep. For the next few days, reassurance is essential. Being calm and helping reorient the person is also of importance.  Most seizures are painless and end spontaneously. Seizures are not harmful to others but can lead to complications such as stress on the lungs, brain and the heart.  Individuals with prior lung problems may develop labored breathing and respiratory distress.    Discussed Patients with epilepsy have a small risk of sudden unexpected death, a condition referred to as sudden unexpected death in epilepsy (SUDEP). SUDEP is defined specifically as the sudden, unexpected, witnessed or unwitnessed, nontraumatic and nondrowning death in patients with epilepsy with or without evidence for a seizure, and excluding documented status epilepticus, in which post mortem examination does not reveal a structural or toxicologic cause for death     No orders of the defined types were placed in this encounter.   Meds ordered this encounter  Medications   levETIRAcetam  (KEPPRA ) 500 MG tablet    Sig: Take 1 tablet (500 mg total) by mouth 2 (two) times daily.    Dispense:  180 tablet    Refill:  3    Return in about 6 months (around 06/14/2024).    Pastor Falling, MD 12/15/2023, 1:51 PM  [facility] [address], [address] [place],  [place] [address] [phone]

## 2023-12-16 ENCOUNTER — Encounter: Admit: 2023-12-16 | Discharge: 2023-12-17 | Payer: BLUE CROSS/BLUE SHIELD | Attending: Clinical | Primary: Clinical

## 2023-12-16 DIAGNOSIS — C801 Malignant (primary) neoplasm, unspecified: Principal | ICD-10-CM

## 2023-12-16 DIAGNOSIS — F331 Major depressive disorder, recurrent, moderate: Principal | ICD-10-CM

## 2023-12-16 DIAGNOSIS — F411 Generalized anxiety disorder: Principal | ICD-10-CM

## 2023-12-17 ENCOUNTER — Inpatient Hospital Stay: Admit: 2023-12-17 | Discharge: 2023-12-17 | Payer: BLUE CROSS/BLUE SHIELD

## 2023-12-17 DIAGNOSIS — C7931 Secondary malignant neoplasm of brain: Principal | ICD-10-CM

## 2023-12-17 DIAGNOSIS — C434 Malignant melanoma of scalp and neck: Principal | ICD-10-CM

## 2023-12-19 ENCOUNTER — Ambulatory Visit: Admit: 2023-12-19 | Payer: BLUE CROSS/BLUE SHIELD | Attending: Hematology & Oncology | Primary: Hematology & Oncology

## 2023-12-19 DIAGNOSIS — C439 Malignant melanoma of skin, unspecified: Principal | ICD-10-CM

## 2023-12-19 DIAGNOSIS — C434 Malignant melanoma of scalp and neck: Principal | ICD-10-CM

## 2023-12-19 DIAGNOSIS — C7931 Secondary malignant neoplasm of brain: Principal | ICD-10-CM

## 2023-12-25 DIAGNOSIS — C434 Malignant melanoma of scalp and neck: Principal | ICD-10-CM

## 2023-12-25 DIAGNOSIS — F331 Major depressive disorder, recurrent, moderate: Principal | ICD-10-CM

## 2023-12-25 DIAGNOSIS — C439 Malignant melanoma of skin, unspecified: Principal | ICD-10-CM

## 2023-12-25 DIAGNOSIS — F411 Generalized anxiety disorder: Principal | ICD-10-CM

## 2023-12-25 DIAGNOSIS — C7931 Secondary malignant neoplasm of brain: Principal | ICD-10-CM

## 2023-12-25 MED ORDER — DULOXETINE 30 MG CAPSULE,DELAYED RELEASE
ORAL_CAPSULE | Freq: Two times a day (BID) | ORAL | 1 refills | 0.00000 days
Start: 2023-12-25 — End: ?

## 2023-12-26 ENCOUNTER — Encounter
Admit: 2023-12-26 | Discharge: 2023-12-27 | Payer: BLUE CROSS/BLUE SHIELD | Attending: Psychiatric/Mental Health | Primary: Psychiatric/Mental Health

## 2023-12-26 DIAGNOSIS — C434 Malignant melanoma of scalp and neck: Principal | ICD-10-CM

## 2023-12-26 DIAGNOSIS — C7931 Secondary malignant neoplasm of brain: Principal | ICD-10-CM

## 2023-12-26 DIAGNOSIS — F419 Anxiety disorder, unspecified: Principal | ICD-10-CM

## 2023-12-26 DIAGNOSIS — F330 Major depressive disorder, recurrent, mild: Principal | ICD-10-CM

## 2023-12-26 DIAGNOSIS — C439 Malignant melanoma of skin, unspecified: Principal | ICD-10-CM

## 2023-12-26 MED ORDER — DULOXETINE 30 MG CAPSULE,DELAYED RELEASE
ORAL_CAPSULE | Freq: Two times a day (BID) | ORAL | 3 refills | 90.00000 days | Status: CP
Start: 2023-12-26 — End: 2024-01-25

## 2024-01-01 DIAGNOSIS — C439 Malignant melanoma of skin, unspecified: Principal | ICD-10-CM

## 2024-01-01 DIAGNOSIS — C7931 Secondary malignant neoplasm of brain: Principal | ICD-10-CM

## 2024-01-01 DIAGNOSIS — C434 Malignant melanoma of scalp and neck: Principal | ICD-10-CM

## 2024-01-02 DIAGNOSIS — C7931 Secondary malignant neoplasm of brain: Principal | ICD-10-CM

## 2024-01-02 DIAGNOSIS — C434 Malignant melanoma of scalp and neck: Principal | ICD-10-CM

## 2024-01-02 DIAGNOSIS — C439 Malignant melanoma of skin, unspecified: Principal | ICD-10-CM

## 2024-01-07 DIAGNOSIS — C7931 Secondary malignant neoplasm of brain: Principal | ICD-10-CM

## 2024-01-07 DIAGNOSIS — C434 Malignant melanoma of scalp and neck: Principal | ICD-10-CM

## 2024-01-07 DIAGNOSIS — C439 Malignant melanoma of skin, unspecified: Principal | ICD-10-CM

## 2024-01-09 ENCOUNTER — Inpatient Hospital Stay: Admit: 2024-01-09 | Discharge: 2024-01-09 | Payer: BLUE CROSS/BLUE SHIELD

## 2024-01-09 DIAGNOSIS — C434 Malignant melanoma of scalp and neck: Principal | ICD-10-CM

## 2024-01-09 DIAGNOSIS — C7931 Secondary malignant neoplasm of brain: Principal | ICD-10-CM

## 2024-01-09 DIAGNOSIS — C439 Malignant melanoma of skin, unspecified: Principal | ICD-10-CM

## 2024-01-13 DIAGNOSIS — C434 Malignant melanoma of scalp and neck: Principal | ICD-10-CM

## 2024-01-13 DIAGNOSIS — C7931 Secondary malignant neoplasm of brain: Principal | ICD-10-CM

## 2024-01-13 DIAGNOSIS — C439 Malignant melanoma of skin, unspecified: Principal | ICD-10-CM

## 2024-01-16 DIAGNOSIS — C439 Malignant melanoma of skin, unspecified: Principal | ICD-10-CM

## 2024-01-16 DIAGNOSIS — C7931 Secondary malignant neoplasm of brain: Principal | ICD-10-CM

## 2024-01-16 DIAGNOSIS — C434 Malignant melanoma of scalp and neck: Principal | ICD-10-CM

## 2024-01-19 DIAGNOSIS — C434 Malignant melanoma of scalp and neck: Principal | ICD-10-CM

## 2024-01-19 DIAGNOSIS — C7931 Secondary malignant neoplasm of brain: Principal | ICD-10-CM

## 2024-01-19 DIAGNOSIS — C439 Malignant melanoma of skin, unspecified: Principal | ICD-10-CM

## 2024-01-20 ENCOUNTER — Ambulatory Visit
Admit: 2024-01-20 | Discharge: 2024-01-20 | Payer: BLUE CROSS/BLUE SHIELD | Attending: Hematology & Oncology | Primary: Hematology & Oncology

## 2024-01-20 ENCOUNTER — Ambulatory Visit
Admit: 2024-01-20 | Discharge: 2024-01-20 | Payer: BLUE CROSS/BLUE SHIELD | Attending: Radiation Oncology | Primary: Radiation Oncology

## 2024-01-20 DIAGNOSIS — C434 Malignant melanoma of scalp and neck: Principal | ICD-10-CM

## 2024-01-20 DIAGNOSIS — C439 Malignant melanoma of skin, unspecified: Principal | ICD-10-CM

## 2024-01-20 DIAGNOSIS — C7931 Secondary malignant neoplasm of brain: Principal | ICD-10-CM

## 2024-01-22 DIAGNOSIS — C434 Malignant melanoma of scalp and neck: Principal | ICD-10-CM

## 2024-01-22 DIAGNOSIS — C439 Malignant melanoma of skin, unspecified: Principal | ICD-10-CM

## 2024-01-22 DIAGNOSIS — C7931 Secondary malignant neoplasm of brain: Principal | ICD-10-CM

## 2024-01-23 DIAGNOSIS — C434 Malignant melanoma of scalp and neck: Principal | ICD-10-CM

## 2024-01-23 DIAGNOSIS — C439 Malignant melanoma of skin, unspecified: Principal | ICD-10-CM

## 2024-01-23 DIAGNOSIS — C7931 Secondary malignant neoplasm of brain: Principal | ICD-10-CM

## 2024-01-25 DIAGNOSIS — C434 Malignant melanoma of scalp and neck: Principal | ICD-10-CM

## 2024-01-25 DIAGNOSIS — C439 Malignant melanoma of skin, unspecified: Principal | ICD-10-CM

## 2024-01-25 DIAGNOSIS — C7931 Secondary malignant neoplasm of brain: Principal | ICD-10-CM

## 2024-02-02 DIAGNOSIS — C434 Malignant melanoma of scalp and neck: Principal | ICD-10-CM

## 2024-02-02 DIAGNOSIS — C439 Malignant melanoma of skin, unspecified: Principal | ICD-10-CM

## 2024-02-02 DIAGNOSIS — C7931 Secondary malignant neoplasm of brain: Principal | ICD-10-CM

## 2024-02-04 DIAGNOSIS — C434 Malignant melanoma of scalp and neck: Principal | ICD-10-CM

## 2024-02-04 DIAGNOSIS — C7931 Secondary malignant neoplasm of brain: Principal | ICD-10-CM

## 2024-02-04 DIAGNOSIS — C439 Malignant melanoma of skin, unspecified: Principal | ICD-10-CM

## 2024-02-07 MED ORDER — BUDESONIDE DR - ER 3 MG CAPSULE,DELAYED,EXTENDED RELEASE
ORAL_CAPSULE | Freq: Every day | ORAL | 3 refills | 0.00000 days
Start: 2024-02-07 — End: ?

## 2024-02-08 DIAGNOSIS — E278 Other specified disorders of adrenal gland: Principal | ICD-10-CM

## 2024-02-08 MED ORDER — CALCITRIOL 0.25 MCG CAPSULE
ORAL_CAPSULE | Freq: Every day | ORAL | 3 refills | 0.00000 days
Start: 2024-02-08 — End: ?

## 2024-02-08 MED ORDER — LEVOTHYROXINE 125 MCG TABLET
ORAL_TABLET | Freq: Every day | ORAL | 3 refills | 0.00000 days
Start: 2024-02-08 — End: ?

## 2024-02-09 DIAGNOSIS — E278 Other specified disorders of adrenal gland: Principal | ICD-10-CM

## 2024-02-09 MED ORDER — BUDESONIDE DR - ER 3 MG CAPSULE,DELAYED,EXTENDED RELEASE
ORAL_CAPSULE | Freq: Every day | ORAL | 3 refills | 90.00000 days
Start: 2024-02-09 — End: ?

## 2024-02-09 MED ORDER — CALCITRIOL 0.25 MCG CAPSULE
ORAL_CAPSULE | Freq: Every day | ORAL | 3 refills | 90.00000 days | Status: CP
Start: 2024-02-09 — End: 2025-02-08

## 2024-02-09 MED ORDER — LEVOTHYROXINE 125 MCG TABLET
ORAL_TABLET | Freq: Every day | ORAL | 3 refills | 90.00000 days | Status: CP
Start: 2024-02-09 — End: 2025-02-08

## 2024-02-12 MED ORDER — PANTOPRAZOLE 40 MG TABLET,DELAYED RELEASE
ORAL_TABLET | Freq: Every day | ORAL | 0 refills | 90.00000 days | Status: CP
Start: 2024-02-12 — End: ?

## 2024-02-13 DIAGNOSIS — C7931 Secondary malignant neoplasm of brain: Secondary | ICD-10-CM

## 2024-02-13 DIAGNOSIS — E782 Mixed hyperlipidemia: Principal | ICD-10-CM

## 2024-02-13 DIAGNOSIS — C439 Malignant melanoma of skin, unspecified: Secondary | ICD-10-CM

## 2024-02-13 DIAGNOSIS — C434 Malignant melanoma of scalp and neck: Secondary | ICD-10-CM

## 2024-02-13 MED ORDER — EZETIMIBE 10 MG TABLET
ORAL_TABLET | Freq: Every day | ORAL | 3 refills | 90.00000 days
Start: 2024-02-13 — End: 2025-02-12

## 2024-02-16 MED ORDER — EZETIMIBE 10 MG TABLET
ORAL_TABLET | Freq: Every day | ORAL | 3 refills | 90.00000 days
Start: 2024-02-16 — End: 2025-02-15

## 2024-03-17 ENCOUNTER — Ambulatory Visit: Admit: 2024-03-17 | Attending: Radiation Oncology | Primary: Radiation Oncology

## 2024-07-15 ENCOUNTER — Ambulatory Visit: Admitting: Neurology
